# Patient Record
Sex: Male | Born: 1985 | Race: Black or African American | Hispanic: No | Marital: Single | Smoking: Current every day smoker
Health system: Southern US, Community
[De-identification: ages and names within clinical notes are randomized; demographics above are authoritative.]

## PROBLEM LIST (undated history)

## (undated) DIAGNOSIS — F1210 Cannabis abuse, uncomplicated: Secondary | ICD-10-CM

## (undated) DIAGNOSIS — N184 Chronic kidney disease, stage 4 (severe): Secondary | ICD-10-CM

## (undated) DIAGNOSIS — Z720 Tobacco use: Secondary | ICD-10-CM

## (undated) DIAGNOSIS — E109 Type 1 diabetes mellitus without complications: Secondary | ICD-10-CM

## (undated) DIAGNOSIS — Z91148 Patient's other noncompliance with medication regimen for other reason: Secondary | ICD-10-CM

## (undated) DIAGNOSIS — Z9114 Patient's other noncompliance with medication regimen: Secondary | ICD-10-CM

## (undated) DIAGNOSIS — E785 Hyperlipidemia, unspecified: Secondary | ICD-10-CM

## (undated) DIAGNOSIS — I469 Cardiac arrest, cause unspecified: Secondary | ICD-10-CM

## (undated) DIAGNOSIS — I10 Essential (primary) hypertension: Secondary | ICD-10-CM

## (undated) DIAGNOSIS — I639 Cerebral infarction, unspecified: Secondary | ICD-10-CM

## (undated) HISTORY — PX: EYE SURGERY: SHX253

## (undated) HISTORY — DX: Cerebral infarction, unspecified: I63.9

---
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
## 2005-02-21 ENCOUNTER — Inpatient Hospital Stay (HOSPITAL_COMMUNITY): Admission: EM | Admit: 2005-02-21 | Discharge: 2005-02-23 | Payer: Self-pay | Admitting: Emergency Medicine

## 2005-03-20 ENCOUNTER — Inpatient Hospital Stay (HOSPITAL_COMMUNITY): Admission: AD | Admit: 2005-03-20 | Discharge: 2005-03-23 | Payer: Self-pay | Admitting: Internal Medicine

## 2005-03-20 ENCOUNTER — Encounter: Payer: Self-pay | Admitting: *Deleted

## 2005-05-16 ENCOUNTER — Inpatient Hospital Stay (HOSPITAL_COMMUNITY): Admission: EM | Admit: 2005-05-16 | Discharge: 2005-05-19 | Payer: Self-pay | Admitting: *Deleted

## 2005-05-20 ENCOUNTER — Emergency Department (HOSPITAL_COMMUNITY): Admission: EM | Admit: 2005-05-20 | Discharge: 2005-05-20 | Payer: Self-pay | Admitting: Emergency Medicine

## 2005-08-24 ENCOUNTER — Inpatient Hospital Stay (HOSPITAL_COMMUNITY): Admission: EM | Admit: 2005-08-24 | Discharge: 2005-08-26 | Payer: Self-pay | Admitting: Emergency Medicine

## 2005-11-13 ENCOUNTER — Inpatient Hospital Stay (HOSPITAL_COMMUNITY): Admission: EM | Admit: 2005-11-13 | Discharge: 2005-11-16 | Payer: Self-pay | Admitting: Emergency Medicine

## 2006-08-01 ENCOUNTER — Emergency Department (HOSPITAL_COMMUNITY): Admission: EM | Admit: 2006-08-01 | Discharge: 2006-08-01 | Payer: Self-pay | Admitting: Emergency Medicine

## 2007-02-02 ENCOUNTER — Inpatient Hospital Stay (HOSPITAL_COMMUNITY): Admission: EM | Admit: 2007-02-02 | Discharge: 2007-02-04 | Payer: Self-pay | Admitting: Emergency Medicine

## 2008-04-10 ENCOUNTER — Emergency Department (HOSPITAL_COMMUNITY): Admission: EM | Admit: 2008-04-10 | Discharge: 2008-04-10 | Payer: Self-pay | Admitting: Emergency Medicine

## 2008-08-28 ENCOUNTER — Emergency Department (HOSPITAL_COMMUNITY): Admission: EM | Admit: 2008-08-28 | Discharge: 2008-08-28 | Payer: Self-pay | Admitting: Emergency Medicine

## 2008-09-26 ENCOUNTER — Emergency Department (HOSPITAL_COMMUNITY): Admission: EM | Admit: 2008-09-26 | Discharge: 2008-09-26 | Payer: Self-pay | Admitting: Emergency Medicine

## 2008-12-06 ENCOUNTER — Emergency Department (HOSPITAL_COMMUNITY): Admission: EM | Admit: 2008-12-06 | Discharge: 2008-12-06 | Payer: Self-pay | Admitting: Emergency Medicine

## 2009-03-23 ENCOUNTER — Emergency Department (HOSPITAL_COMMUNITY): Admission: EM | Admit: 2009-03-23 | Discharge: 2009-03-23 | Payer: Self-pay | Admitting: Emergency Medicine

## 2009-03-24 ENCOUNTER — Emergency Department (HOSPITAL_COMMUNITY): Admission: EM | Admit: 2009-03-24 | Discharge: 2009-03-24 | Payer: Self-pay | Admitting: Emergency Medicine

## 2009-05-13 ENCOUNTER — Emergency Department (HOSPITAL_COMMUNITY): Admission: EM | Admit: 2009-05-13 | Discharge: 2009-05-13 | Payer: Self-pay | Admitting: Emergency Medicine

## 2009-07-04 ENCOUNTER — Emergency Department (HOSPITAL_COMMUNITY): Admission: EM | Admit: 2009-07-04 | Discharge: 2009-07-04 | Payer: Self-pay | Admitting: Emergency Medicine

## 2009-07-17 ENCOUNTER — Emergency Department (HOSPITAL_COMMUNITY): Admission: EM | Admit: 2009-07-17 | Discharge: 2009-07-17 | Payer: Self-pay | Admitting: Emergency Medicine

## 2009-10-13 ENCOUNTER — Emergency Department (HOSPITAL_COMMUNITY): Admission: EM | Admit: 2009-10-13 | Discharge: 2009-10-13 | Payer: Self-pay | Admitting: Emergency Medicine

## 2009-10-14 ENCOUNTER — Inpatient Hospital Stay (HOSPITAL_COMMUNITY): Admission: EM | Admit: 2009-10-14 | Discharge: 2009-10-16 | Payer: Self-pay | Admitting: Emergency Medicine

## 2010-03-04 ENCOUNTER — Emergency Department (HOSPITAL_COMMUNITY): Admission: EM | Admit: 2010-03-04 | Discharge: 2010-03-04 | Payer: Self-pay | Admitting: Emergency Medicine

## 2010-04-27 ENCOUNTER — Inpatient Hospital Stay (HOSPITAL_COMMUNITY): Admission: EM | Admit: 2010-04-27 | Discharge: 2010-04-29 | Payer: Self-pay | Admitting: Emergency Medicine

## 2010-06-07 ENCOUNTER — Emergency Department (HOSPITAL_COMMUNITY): Admission: EM | Admit: 2010-06-07 | Discharge: 2010-06-07 | Payer: Self-pay | Admitting: Emergency Medicine

## 2010-12-03 LAB — URINALYSIS, ROUTINE W REFLEX MICROSCOPIC
Glucose, UA: >1000 mg/dL — AB
Leukocytes, UA: NEGATIVE
Specific Gravity, Urine: 1.015 (ref 1.005–1.030)
pH: 6 (ref 5.0–8.0)

## 2010-12-03 LAB — BLOOD GAS, ARTERIAL
Acid-base deficit: 2.2 mmol/L — ABNORMAL HIGH (ref 0.0–2.0)
Bicarbonate: 22.6 mEq/L (ref 20.0–24.0)
O2 Saturation: 96 %
TCO2: 20.2 mmol/L (ref 0–100)
pCO2 arterial: 42.1 mmHg (ref 35.0–45.0)

## 2010-12-03 LAB — BASIC METABOLIC PANEL
BUN: 13 mg/dL (ref 6–23)
CO2: 23 mEq/L (ref 19–32)
Chloride: 94 mEq/L — ABNORMAL LOW (ref 96–112)
GFR calc non Af Amer: >60 mL/min (ref >60)
Glucose, Bld: 367 mg/dL — ABNORMAL HIGH (ref 70–99)
Potassium: 3.7 mEq/L (ref 3.5–5.1)
Sodium: 136 mEq/L (ref 135–145)

## 2010-12-03 LAB — CBC
MCH: 30.9 pg (ref 26.0–34.0)
MCHC: 33.6 g/dL (ref 30.0–36.0)
WBC: 14.9 10*3/uL — ABNORMAL HIGH (ref 4.0–10.5)

## 2010-12-03 LAB — DIFFERENTIAL
Basophils Absolute: 0 10*3/uL (ref 0.0–0.1)
Basophils Relative: 0 % (ref 0–1)
Eosinophils Absolute: 0 10*3/uL (ref 0.0–0.7)
Eosinophils Relative: 0 % (ref 0–5)
Lymphocytes Relative: 7 % — ABNORMAL LOW (ref 12–46)
Lymphs Abs: 1 10*3/uL (ref 0.7–4.0)
Monocytes Absolute: 0.2 10*3/uL (ref 0.1–1.0)
Neutrophils Relative %: 91 % — ABNORMAL HIGH (ref 43–77)

## 2010-12-03 LAB — KETONES, QUALITATIVE: Acetone, Bld: NEGATIVE

## 2010-12-03 LAB — URINE MICROSCOPIC-ADD ON

## 2010-12-03 LAB — GLUCOSE, CAPILLARY

## 2010-12-04 LAB — DIFFERENTIAL
Basophils Absolute: 0 10*3/uL (ref 0.0–0.1)
Basophils Relative: 0 % (ref 0–1)
Basophils Relative: 0 % (ref 0–1)
Eosinophils Absolute: 0 10*3/uL (ref 0.0–0.7)
Eosinophils Relative: 0 % (ref 0–5)
Eosinophils Relative: 1 % (ref 0–5)
Lymphocytes Relative: 31 % (ref 12–46)
Lymphocytes Relative: 8 % — ABNORMAL LOW (ref 12–46)
Lymphs Abs: 1.1 10*3/uL (ref 0.7–4.0)
Monocytes Absolute: 0.2 10*3/uL (ref 0.1–1.0)
Monocytes Absolute: 0.3 10*3/uL (ref 0.1–1.0)
Monocytes Absolute: 0.4 10*3/uL (ref 0.1–1.0)
Monocytes Relative: 3 % (ref 3–12)
Monocytes Relative: 5 % (ref 3–12)
Neutro Abs: 11.7 10*3/uL — ABNORMAL HIGH (ref 1.7–7.7)
Neutro Abs: 5.4 10*3/uL (ref 1.7–7.7)

## 2010-12-04 LAB — COMPREHENSIVE METABOLIC PANEL
ALT: 13 U/L (ref 0–53)
AST: 15 U/L (ref 0–37)
AST: 19 U/L (ref 0–37)
Albumin: 3.3 g/dL — ABNORMAL LOW (ref 3.5–5.2)
Albumin: 3.4 g/dL — ABNORMAL LOW (ref 3.5–5.2)
Alkaline Phosphatase: 66 U/L (ref 39–117)
BUN: 4 mg/dL — ABNORMAL LOW (ref 6–23)
CO2: 25 mEq/L (ref 19–32)
Chloride: 103 mEq/L (ref 96–112)
Creatinine, Ser: 0.73 mg/dL (ref 0.4–1.5)
GFR calc Af Amer: >60 mL/min (ref >60)
GFR calc Af Amer: >60 mL/min (ref >60)
GFR calc non Af Amer: >60 mL/min (ref >60)
Potassium: 3.1 mEq/L — ABNORMAL LOW (ref 3.5–5.1)
Potassium: 3.5 mEq/L (ref 3.5–5.1)
Total Bilirubin: 1 mg/dL (ref 0.3–1.2)
Total Protein: 6.5 g/dL (ref 6.0–8.3)

## 2010-12-04 LAB — BLOOD GAS, ARTERIAL
Acid-base deficit: 1.4 mmol/L (ref 0.0–2.0)
Bicarbonate: 22.2 mEq/L (ref 20.0–24.0)
O2 Saturation: 97.5 %
Patient temperature: 37
TCO2: 19.4 mmol/L (ref 0–100)
pCO2 arterial: 33.9 mmHg — ABNORMAL LOW (ref 35.0–45.0)
pH, Arterial: 7.432 (ref 7.350–7.450)
pO2, Arterial: 98.2 mmHg (ref 80.0–100.0)

## 2010-12-04 LAB — GLUCOSE, CAPILLARY
Glucose-Capillary: 117 mg/dL — ABNORMAL HIGH (ref 70–99)
Glucose-Capillary: 119 mg/dL — ABNORMAL HIGH (ref 70–99)
Glucose-Capillary: 132 mg/dL — ABNORMAL HIGH (ref 70–99)
Glucose-Capillary: 162 mg/dL — ABNORMAL HIGH (ref 70–99)
Glucose-Capillary: 164 mg/dL — ABNORMAL HIGH (ref 70–99)
Glucose-Capillary: 177 mg/dL — ABNORMAL HIGH (ref 70–99)
Glucose-Capillary: 203 mg/dL — ABNORMAL HIGH (ref 70–99)
Glucose-Capillary: 234 mg/dL — ABNORMAL HIGH (ref 70–99)
Glucose-Capillary: 32 mg/dL — CL (ref 70–99)
Glucose-Capillary: 33 mg/dL — CL (ref 70–99)
Glucose-Capillary: 333 mg/dL — ABNORMAL HIGH (ref 70–99)
Glucose-Capillary: 34 mg/dL — CL (ref 70–99)
Glucose-Capillary: 36 mg/dL — CL (ref 70–99)
Glucose-Capillary: 44 mg/dL — CL (ref 70–99)
Glucose-Capillary: 487 mg/dL — ABNORMAL HIGH (ref 70–99)
Glucose-Capillary: 55 mg/dL — ABNORMAL LOW (ref 70–99)
Glucose-Capillary: 56 mg/dL — ABNORMAL LOW (ref 70–99)
Glucose-Capillary: 68 mg/dL — ABNORMAL LOW (ref 70–99)
Glucose-Capillary: 68 mg/dL — ABNORMAL LOW (ref 70–99)
Glucose-Capillary: 72 mg/dL (ref 70–99)
Glucose-Capillary: 83 mg/dL (ref 70–99)
Glucose-Capillary: 99 mg/dL (ref 70–99)
Glucose-Capillary: 99 mg/dL (ref 70–99)

## 2010-12-04 LAB — URINALYSIS, ROUTINE W REFLEX MICROSCOPIC
Bilirubin Urine: NEGATIVE
Glucose, UA: >1000 mg/dL — AB
Ketones, ur: >80 mg/dL — AB
Leukocytes, UA: NEGATIVE
Nitrite: NEGATIVE
Protein, ur: NEGATIVE mg/dL
Specific Gravity, Urine: 1.025 (ref 1.005–1.030)
Urobilinogen, UA: 0.2 mg/dL (ref 0.0–1.0)
pH: 6 (ref 5.0–8.0)

## 2010-12-04 LAB — KETONES, QUALITATIVE

## 2010-12-04 LAB — CBC
HCT: 45 % (ref 39.0–52.0)
Hemoglobin: 12.8 g/dL — ABNORMAL LOW (ref 13.0–17.0)
Hemoglobin: 15.2 g/dL (ref 13.0–17.0)
MCH: 31.1 pg (ref 26.0–34.0)
MCH: 31.2 pg (ref 26.0–34.0)
MCHC: 33.8 g/dL (ref 30.0–36.0)
MCV: 91.8 fL (ref 78.0–100.0)
Platelets: 179 10*3/uL (ref 150–400)
Platelets: 183 10*3/uL (ref 150–400)
RBC: 4.11 MIL/uL — ABNORMAL LOW (ref 4.22–5.81)
RDW: 14.4 % (ref 11.5–15.5)
WBC: 13.2 10*3/uL — ABNORMAL HIGH (ref 4.0–10.5)
WBC: 8.7 10*3/uL (ref 4.0–10.5)
WBC: 9.8 10*3/uL (ref 4.0–10.5)

## 2010-12-04 LAB — RAPID URINE DRUG SCREEN, HOSP PERFORMED
Amphetamines: NOT DETECTED
Barbiturates: NOT DETECTED
Benzodiazepines: NOT DETECTED
Cocaine: NOT DETECTED
Opiates: NOT DETECTED
Tetrahydrocannabinol: POSITIVE — AB

## 2010-12-04 LAB — BASIC METABOLIC PANEL
Chloride: 97 mEq/L (ref 96–112)
GFR calc Af Amer: >60 mL/min (ref >60)
Potassium: 3.8 mEq/L (ref 3.5–5.1)
Sodium: 139 mEq/L (ref 135–145)

## 2010-12-04 LAB — HEMOGLOBIN A1C: Hgb A1c MFr Bld: 12.8 % — ABNORMAL HIGH (ref <5.7)

## 2010-12-04 LAB — POTASSIUM: Potassium: 4.1 mEq/L (ref 3.5–5.1)

## 2010-12-04 LAB — URINE MICROSCOPIC-ADD ON

## 2010-12-04 LAB — BRAIN NATRIURETIC PEPTIDE: Pro B Natriuretic peptide (BNP): 50.8 pg/mL (ref 0.0–100.0)

## 2010-12-06 LAB — BLOOD GAS, ARTERIAL
Bicarbonate: 22.9 mEq/L (ref 20.0–24.0)
FIO2: 0.21 %
O2 Saturation: 96.7 %
pH, Arterial: 7.388 (ref 7.350–7.450)
pO2, Arterial: 91.5 mmHg (ref 80.0–100.0)

## 2010-12-06 LAB — GLUCOSE, CAPILLARY
Glucose-Capillary: 139 mg/dL — ABNORMAL HIGH (ref 70–99)
Glucose-Capillary: 173 mg/dL — ABNORMAL HIGH (ref 70–99)
Glucose-Capillary: 183 mg/dL — ABNORMAL HIGH (ref 70–99)
Glucose-Capillary: 226 mg/dL — ABNORMAL HIGH (ref 70–99)
Glucose-Capillary: 46 mg/dL — ABNORMAL LOW (ref 70–99)
Glucose-Capillary: 98 mg/dL (ref 70–99)

## 2010-12-06 LAB — CBC
HCT: 40.3 % (ref 39.0–52.0)
HCT: 45.9 % (ref 39.0–52.0)
Hemoglobin: 13.6 g/dL (ref 13.0–17.0)
Hemoglobin: 15.8 g/dL (ref 13.0–17.0)
Hemoglobin: 15.4 g/dL (ref 13.0–17.0)
MCHC: 34 g/dL (ref 30.0–36.0)
MCHC: 33.5 g/dL (ref 30.0–36.0)
MCV: 90.4 fL (ref 78.0–100.0)
Platelets: 163 10*3/uL (ref 150–400)
Platelets: 178 10*3/uL (ref 150–400)
Platelets: 202 10*3/uL (ref 150–400)
RBC: 4.41 MIL/uL (ref 4.22–5.81)
RDW: 13.9 % (ref 11.5–15.5)
RDW: 14.1 % (ref 11.5–15.5)
WBC: 7.9 10*3/uL (ref 4.0–10.5)
WBC: 12.2 10*3/uL — ABNORMAL HIGH (ref 4.0–10.5)

## 2010-12-06 LAB — BASIC METABOLIC PANEL
BUN: 10 mg/dL (ref 6–23)
BUN: 8 mg/dL (ref 6–23)
CO2: 22 mEq/L (ref 19–32)
CO2: 27 mEq/L (ref 19–32)
Calcium: 8.2 mg/dL — ABNORMAL LOW (ref 8.4–10.5)
Calcium: 8.7 mg/dL (ref 8.4–10.5)
Calcium: 8.8 mg/dL (ref 8.4–10.5)
Chloride: 101 mEq/L (ref 96–112)
Chloride: 98 mEq/L (ref 96–112)
Creatinine, Ser: 0.84 mg/dL (ref 0.4–1.5)
Creatinine, Ser: 0.87 mg/dL (ref 0.4–1.5)
Creatinine, Ser: 0.91 mg/dL (ref 0.4–1.5)
GFR calc Af Amer: >60 mL/min (ref >60)
GFR calc Af Amer: >60 mL/min (ref >60)
GFR calc Af Amer: >60 mL/min (ref >60)
GFR calc non Af Amer: >60 mL/min (ref >60)
GFR calc non Af Amer: >60 mL/min (ref >60)
Glucose, Bld: 273 mg/dL — ABNORMAL HIGH (ref 70–99)
Glucose, Bld: 377 mg/dL — ABNORMAL HIGH (ref 70–99)
Potassium: 4 mEq/L (ref 3.5–5.1)
Sodium: 136 mEq/L (ref 135–145)

## 2010-12-06 LAB — URINE MICROSCOPIC-ADD ON

## 2010-12-06 LAB — COMPREHENSIVE METABOLIC PANEL
ALT: 19 U/L (ref 0–53)
ALT: 15 U/L (ref 0–53)
Albumin: 4.1 g/dL (ref 3.5–5.2)
Albumin: 3.8 g/dL (ref 3.5–5.2)
Alkaline Phosphatase: 86 U/L (ref 39–117)
Alkaline Phosphatase: 74 U/L (ref 39–117)
BUN: 20 mg/dL (ref 6–23)
BUN: 12 mg/dL (ref 6–23)
Chloride: 101 mEq/L (ref 96–112)
GFR calc Af Amer: >60 mL/min (ref >60)
Glucose, Bld: 270 mg/dL — ABNORMAL HIGH (ref 70–99)
Potassium: 3.7 mEq/L (ref 3.5–5.1)
Potassium: 3.3 mEq/L — ABNORMAL LOW (ref 3.5–5.1)
Sodium: 134 mEq/L — ABNORMAL LOW (ref 135–145)
Total Bilirubin: 1.5 mg/dL — ABNORMAL HIGH (ref 0.3–1.2)
Total Protein: 8.1 g/dL (ref 6.0–8.3)

## 2010-12-06 LAB — URINALYSIS, ROUTINE W REFLEX MICROSCOPIC
Bilirubin Urine: NEGATIVE
Glucose, UA: >1000 mg/dL — AB
Hgb urine dipstick: NEGATIVE
Leukocytes, UA: NEGATIVE
Leukocytes, UA: NEGATIVE
Protein, ur: 30 mg/dL — AB
Protein, ur: NEGATIVE mg/dL
Specific Gravity, Urine: 1.015 (ref 1.005–1.030)
Urobilinogen, UA: 0.2 mg/dL (ref 0.0–1.0)
Urobilinogen, UA: 0.2 mg/dL (ref 0.0–1.0)
pH: 8.5 — ABNORMAL HIGH (ref 5.0–8.0)

## 2010-12-06 LAB — DIFFERENTIAL
Basophils Absolute: 0 10*3/uL (ref 0.0–0.1)
Basophils Absolute: 0 10*3/uL (ref 0.0–0.1)
Basophils Relative: 0 % (ref 0–1)
Basophils Relative: 0 % (ref 0–1)
Basophils Relative: 1 % (ref 0–1)
Basophils Relative: 0 % (ref 0–1)
Eosinophils Absolute: 0 10*3/uL (ref 0.0–0.7)
Eosinophils Absolute: 0 10*3/uL (ref 0.0–0.7)
Eosinophils Relative: 1 % (ref 0–5)
Lymphocytes Relative: 45 % (ref 12–46)
Lymphs Abs: 3.5 10*3/uL (ref 0.7–4.0)
Monocytes Absolute: 0.4 10*3/uL (ref 0.1–1.0)
Monocytes Absolute: 0.4 10*3/uL (ref 0.1–1.0)
Monocytes Absolute: 0.3 10*3/uL (ref 0.1–1.0)
Monocytes Relative: 6 % (ref 3–12)
Neutro Abs: 6.3 10*3/uL (ref 1.7–7.7)
Neutro Abs: 7.6 10*3/uL (ref 1.7–7.7)
Neutro Abs: 10.8 10*3/uL — ABNORMAL HIGH (ref 1.7–7.7)
Neutrophils Relative %: 81 % — ABNORMAL HIGH (ref 43–77)
Neutrophils Relative %: 89 % — ABNORMAL HIGH (ref 43–77)

## 2010-12-06 LAB — CULTURE, BLOOD (ROUTINE X 2): Report Status: 1302011

## 2010-12-06 LAB — MAGNESIUM: Magnesium: 1.9 mg/dL (ref 1.5–2.5)

## 2010-12-06 LAB — KETONES, QUALITATIVE

## 2010-12-10 ENCOUNTER — Emergency Department (HOSPITAL_COMMUNITY)
Admission: EM | Admit: 2010-12-10 | Discharge: 2010-12-11 | Disposition: A | Discharge status: Home / Self Care | Payer: Medicare Other | Attending: Emergency Medicine | Admitting: Emergency Medicine

## 2010-12-10 ENCOUNTER — Emergency Department (HOSPITAL_COMMUNITY): Payer: Medicare Other

## 2010-12-10 ENCOUNTER — Encounter (HOSPITAL_COMMUNITY): Payer: Self-pay | Admitting: Radiology

## 2010-12-10 DIAGNOSIS — R160 Hepatomegaly, not elsewhere classified: Secondary | ICD-10-CM | POA: Insufficient documentation

## 2010-12-10 DIAGNOSIS — E119 Type 2 diabetes mellitus without complications: Secondary | ICD-10-CM | POA: Insufficient documentation

## 2010-12-10 DIAGNOSIS — R109 Unspecified abdominal pain: Secondary | ICD-10-CM | POA: Insufficient documentation

## 2010-12-10 DIAGNOSIS — R1012 Left upper quadrant pain: Secondary | ICD-10-CM | POA: Insufficient documentation

## 2010-12-10 LAB — RAPID URINE DRUG SCREEN, HOSP PERFORMED
Amphetamines: NOT DETECTED
Barbiturates: NOT DETECTED
Benzodiazepines: NOT DETECTED
Opiates: POSITIVE — AB

## 2010-12-10 LAB — CBC
HCT: 43 % (ref 39.0–52.0)
MCHC: 35.1 g/dL (ref 30.0–36.0)
Platelets: 197 10*3/uL (ref 150–400)
RDW: 12.9 % (ref 11.5–15.5)
WBC: 14.8 10*3/uL — ABNORMAL HIGH (ref 4.0–10.5)

## 2010-12-10 LAB — HEPATIC FUNCTION PANEL
ALT: 17 U/L (ref 0–53)
AST: 22 U/L (ref 0–37)
Albumin: 3.7 g/dL (ref 3.5–5.2)
Total Protein: 7 g/dL (ref 6.0–8.3)

## 2010-12-10 LAB — DIFFERENTIAL
Basophils Absolute: 0 10*3/uL (ref 0.0–0.1)
Basophils Relative: 0 % (ref 0–1)
Eosinophils Relative: 0 % (ref 0–5)
Lymphocytes Relative: 8 % — ABNORMAL LOW (ref 12–46)

## 2010-12-10 LAB — BASIC METABOLIC PANEL
Calcium: 9.2 mg/dL (ref 8.4–10.5)
GFR calc Af Amer: >60 mL/min (ref >60)
GFR calc non Af Amer: >60 mL/min (ref >60)
Glucose, Bld: 343 mg/dL — ABNORMAL HIGH (ref 70–99)
Potassium: 3.9 mEq/L (ref 3.5–5.1)
Sodium: 137 mEq/L (ref 135–145)

## 2010-12-10 LAB — GLUCOSE, CAPILLARY: Glucose-Capillary: 252 mg/dL — ABNORMAL HIGH (ref 70–99)

## 2010-12-10 MED ORDER — IOHEXOL 300 MG/ML  SOLN
100.0000 mL | Freq: Once | INTRAMUSCULAR | Status: AC | PRN
  Administered 2010-12-10: 100 mL via INTRAVENOUS

## 2010-12-11 ENCOUNTER — Inpatient Hospital Stay (HOSPITAL_COMMUNITY)
Admission: EM | Admit: 2010-12-11 | Discharge: 2010-12-13 | DRG: 639 | Disposition: A | Discharge status: Home / Self Care | Payer: Medicare Other | Attending: Internal Medicine | Admitting: Internal Medicine

## 2010-12-11 DIAGNOSIS — IMO0002 Reserved for concepts with insufficient information to code with codable children: Principal | ICD-10-CM | POA: Diagnosis present

## 2010-12-11 DIAGNOSIS — E1065 Type 1 diabetes mellitus with hyperglycemia: Principal | ICD-10-CM | POA: Diagnosis present

## 2010-12-11 DIAGNOSIS — Z91199 Patient's noncompliance with other medical treatment and regimen due to unspecified reason: Secondary | ICD-10-CM

## 2010-12-11 DIAGNOSIS — Z794 Long term (current) use of insulin: Secondary | ICD-10-CM

## 2010-12-11 DIAGNOSIS — E860 Dehydration: Secondary | ICD-10-CM | POA: Diagnosis present

## 2010-12-11 DIAGNOSIS — Z9119 Patient's noncompliance with other medical treatment and regimen: Secondary | ICD-10-CM

## 2010-12-11 LAB — GLUCOSE, CAPILLARY
Glucose-Capillary: >600 mg/dL (ref 70–99)
Glucose-Capillary: 273 mg/dL — ABNORMAL HIGH (ref 70–99)

## 2010-12-11 LAB — URINALYSIS, ROUTINE W REFLEX MICROSCOPIC
Bilirubin Urine: NEGATIVE
Glucose, UA: >1000 mg/dL — AB
Leukocytes, UA: NEGATIVE
Nitrite: NEGATIVE
Protein, ur: NEGATIVE mg/dL
Protein, ur: NEGATIVE mg/dL
Specific Gravity, Urine: <1.005 — ABNORMAL LOW (ref 1.005–1.030)
Urobilinogen, UA: 0.2 mg/dL (ref 0.0–1.0)
Urobilinogen, UA: 0.2 mg/dL (ref 0.0–1.0)

## 2010-12-11 LAB — URINE MICROSCOPIC-ADD ON

## 2010-12-11 LAB — CBC
Hemoglobin: 14.8 g/dL (ref 13.0–17.0)
Platelets: 255 10*3/uL (ref 150–400)
RBC: 4.75 MIL/uL (ref 4.22–5.81)
WBC: 15.3 10*3/uL — ABNORMAL HIGH (ref 4.0–10.5)

## 2010-12-11 LAB — BASIC METABOLIC PANEL
CO2: 24 mEq/L (ref 19–32)
Chloride: 92 mEq/L — ABNORMAL LOW (ref 96–112)
GFR calc Af Amer: >60 mL/min (ref >60)
Potassium: 3.5 mEq/L (ref 3.5–5.1)

## 2010-12-11 LAB — DIFFERENTIAL
Basophils Relative: 1 % (ref 0–1)
Eosinophils Absolute: 0 10*3/uL (ref 0.0–0.7)
Lymphs Abs: 1.7 10*3/uL (ref 0.7–4.0)
Monocytes Relative: 5 % (ref 3–12)
Neutro Abs: 12.8 10*3/uL — ABNORMAL HIGH (ref 1.7–7.7)
Neutrophils Relative %: 83 % — ABNORMAL HIGH (ref 43–77)

## 2010-12-12 LAB — COMPREHENSIVE METABOLIC PANEL
ALT: 13 U/L (ref 0–53)
AST: 16 U/L (ref 0–37)
Albumin: 2.9 g/dL — ABNORMAL LOW (ref 3.5–5.2)
Alkaline Phosphatase: 71 U/L (ref 39–117)
Calcium: 7.9 mg/dL — ABNORMAL LOW (ref 8.4–10.5)
GFR calc Af Amer: >60 mL/min (ref >60)
Glucose, Bld: 117 mg/dL — ABNORMAL HIGH (ref 70–99)
Potassium: 3.6 mEq/L (ref 3.5–5.1)
Sodium: 137 mEq/L (ref 135–145)
Total Protein: 5.7 g/dL — ABNORMAL LOW (ref 6.0–8.3)

## 2010-12-12 LAB — CBC
MCHC: 34 g/dL (ref 30.0–36.0)
Platelets: 214 10*3/uL (ref 150–400)
RDW: 13.2 % (ref 11.5–15.5)
WBC: 12.5 10*3/uL — ABNORMAL HIGH (ref 4.0–10.5)

## 2010-12-12 LAB — DIFFERENTIAL
Basophils Absolute: 0.1 10*3/uL (ref 0.0–0.1)
Basophils Relative: 1 % (ref 0–1)
Eosinophils Absolute: 0.1 10*3/uL (ref 0.0–0.7)
Eosinophils Relative: 1 % (ref 0–5)
Lymphocytes Relative: 25 % (ref 12–46)
Monocytes Absolute: 0.8 10*3/uL (ref 0.1–1.0)

## 2010-12-12 LAB — GLUCOSE, CAPILLARY
Glucose-Capillary: 102 mg/dL — ABNORMAL HIGH (ref 70–99)
Glucose-Capillary: 77 mg/dL (ref 70–99)
Glucose-Capillary: 105 mg/dL — ABNORMAL HIGH (ref 70–99)
Glucose-Capillary: 113 mg/dL — ABNORMAL HIGH (ref 70–99)
Glucose-Capillary: 131 mg/dL — ABNORMAL HIGH (ref 70–99)
Glucose-Capillary: 51 mg/dL — ABNORMAL LOW (ref 70–99)
Glucose-Capillary: 111 mg/dL — ABNORMAL HIGH (ref 70–99)
Glucose-Capillary: 68 mg/dL — ABNORMAL LOW (ref 70–99)

## 2010-12-12 LAB — HEMOGLOBIN A1C: Mean Plasma Glucose: 312 mg/dL — ABNORMAL HIGH (ref <117)

## 2010-12-12 NOTE — H&P (Signed)
NAME:  KERT, MAKOSKY           ACCOUNT NO.:  [id number]  MEDICAL RECORD NO.:  [id number]           PATIENT TYPE:  I  LOCATION:  A313                          FACILITY:  [facility]  PHYSICIAN:  Doree Albee, M.D.DATE OF BIRTH:  July 31, 1986  DATE OF ADMISSION:  12/11/2010 DATE OF DISCHARGE:  LH                             HISTORY & PHYSICAL   CHIEF COMPLAINT: 1. Nausea and vomiting. 2. Hyperglycemia.  HISTORY OF PRESENT ILLNESS:  This is a 25 year old man, who is known to be type 1 diabetic and who has had several admissions in the past with mild diabetic ketoacidosis, presents once again with a 2-day history of nausea and vomiting.  He has noticed elevated blood glucoses.  He apparently was in the emergency room yesterday and was deemed to be okay for discharge back home.  Unfortunately, he has not improved.  He is known to be rather noncompliant with his diabetes and has not kept appointments with his endocrinologist, Dr. Dwyane Dee in [place].  In fact I cannot remember when I last saw him where that I should have been seeing him at least every 3 months.  He has some minimal abdominal pain. He was noted to have a blood glucose of 580, although, his bicarbonate was in the normal range at 24.  PAST MEDICAL HISTORY:  Significant for cataract surgery and type 1 diabetes.  MEDICATIONS:  He reportedly takes Lantus 100 units twice a day, but in past records that I have seen he has been hypoglycemic on this dosage. He takes also NovoLog short-acting insulin on a sliding scale.  ALLERGIES:  I do not see any evidence of any clear allergies here in the E-chart.  SOCIAL HISTORY:  The patient lives with his mother.  He is on disability.  He said he smokes 5 cigarettes per day.  Also, noted his history of cannabis use.  FAMILY HISTORY:  Noncontributory.  REVIEW OF SYSTEMS:  Apart from symptoms mentioned above, there are no other symptoms referable to all systems  reviewed.  PHYSICAL EXAMINATION:  VITAL SIGNS:  Temperature 97.5, blood pressure 167/79, pulse 100 in sinus rhythm, respiratory rate 12-14, saturation 100%. GENERAL:  He does not look septic and he really does not have increased respiration indicative typically of diabetic ketoacidosis.  He does look somewhat dehydrated clinically.  There is no jaundice, clubbing, or clinical anemia. CARDIOVASCULAR:  Heart sounds are present and normal. LUNGS:  Lung fields are clear. ABDOMEN:  Soft and really not tender.  Bowel sounds are somewhat scanty. NEUROLOGIC:  He is alert and oriented without any focal neurologic signs. SKIN:  There are no obvious skin lesions or rashes.  INVESTIGATIONS:  Hemoglobin 14.8, white blood cell count 15.3, platelets 255.  Sodium 134, potassium 3.5, chloride 92, bicarbonate 24, blood glucose 580, BUN 11, creatinine 1.22.  His calculated anion gap is actually 18.  PROBLEM LIST:  Uncontrolled diabetes, likely mild diabetic ketoacidosis.  PLAN: 1. Admit. 2. Intravenous fluids and for now he can have subcutaneous insulin.  I     think it is safe to do so and I think his symptoms would resolve     fairly  rapidly based on past experience in this situation with him. 3. Further recommendations will depend on the patient's hospital     progress.     Doree Albee, M.D.     NCG/MEDQ  D:  12/11/2010  T:  12/11/2010  Job:  [id number]  cc:   Elayne Snare, M.D. Fax: [phone]  Tammi Sou, MD Fax: [phone]  Electronically Signed by Hurshel Party M.D. on 12/12/2010 05:37:17 PM

## 2010-12-13 LAB — DIFFERENTIAL
Eosinophils Relative: 0 % (ref 0–5)
Lymphocytes Relative: 19 % (ref 12–46)
Lymphs Abs: 2 10*3/uL (ref 0.7–4.0)
Monocytes Relative: 6 % (ref 3–12)

## 2010-12-13 LAB — COMPREHENSIVE METABOLIC PANEL
Alkaline Phosphatase: 74 U/L (ref 39–117)
BUN: 2 mg/dL — ABNORMAL LOW (ref 6–23)
Chloride: 97 mEq/L (ref 96–112)
Glucose, Bld: 149 mg/dL — ABNORMAL HIGH (ref 70–99)
Potassium: 3 mEq/L — ABNORMAL LOW (ref 3.5–5.1)
Total Bilirubin: 1.1 mg/dL (ref 0.3–1.2)

## 2010-12-13 LAB — CBC
HCT: 43.3 % (ref 39.0–52.0)
MCH: 31.1 pg (ref 26.0–34.0)
MCV: 88.5 fL (ref 78.0–100.0)
RBC: 4.89 MIL/uL (ref 4.22–5.81)
RDW: 12.7 % (ref 11.5–15.5)
WBC: 10.5 10*3/uL (ref 4.0–10.5)

## 2010-12-13 LAB — GLUCOSE, CAPILLARY
Glucose-Capillary: 102 mg/dL — ABNORMAL HIGH (ref 70–99)
Glucose-Capillary: 78 mg/dL (ref 70–99)

## 2010-12-14 LAB — GLUCOSE, CAPILLARY: Glucose-Capillary: 186 mg/dL — ABNORMAL HIGH (ref 70–99)

## 2010-12-21 NOTE — Discharge Summary (Signed)
  Nathaniel [name], Nathaniel [name]           ACCOUNT NO.:  [id number]  MEDICAL RECORD NO.:  [id number]           PATIENT TYPE:  I  LOCATION:  A313                          FACILITY:  [facility]  PHYSICIAN:  Doree Albee, M.D.DATE OF BIRTH:  12/21/1985  DATE OF ADMISSION:  12/11/2010 DATE OF DISCHARGE:  03/25/2012LH                              DISCHARGE SUMMARY   FINAL DISCHARGE DIAGNOSIS:  Uncontrolled type 1 diabetes mellitus, improved.  CONDITION ON DISCHARGE:  Stable.  MEDICATIONS ON DISCHARGE: 1. Lantus insulin 40 units daily with and NovoLog insulin sliding     scale. 2. Metoclopramide 5 mg q.a.c.  HISTORY:  This 25 year old noncompliant type 1 diabetic was admitted 2 days ago with nausea, vomiting and significant hyperglycemia with blood sugar of 580.  Please see initial history and physical examination done by Dr. Hurshel Party.  HOSPITAL PROGRESS:  The patient's initial anion gap was 18, although his bicarbonate was in the normal range.  He was aggressively hydrated with intravenous fluids by the way of normal saline and subcutaneous insulin was used in a sliding scale manner.  He is improved over the last 24-36 hours, and he feels less nauseous and he feels better enough that he can able to eat now.  Metoclopramide prior to meals did help him achieve some stability.  Interestingly, he is reported home dose of Lantus insulin at 100 units twice a day.  In fact when this dosage was attempted to be given hypoglycemia was achieved, so I suspect based on his weight of 75 kg or so.  He really needs more in the region of 40-50 units of insulin daily.  Of course, this will need to be adjusted based on his sugar levels.  His hemoglobin A1c was noted to be 12.5%.  Today, he feels well.  PHYSICAL EXAMINATION:  VITAL SIGNS:  Temperature 98.4, blood pressure 146/96, pulse 80, saturation 99% on room air. HEART:  Sounds are present and normal. LUNG:  Fields are clear. NEUROLOGIC:  He is  alert and oriented without any focal neurologic signs.  LABORATORY DATA:  Investigations today show sodium of 134, potassium 3.0, bicarbonate 24, glucose 149, BUN 2, creatinine 0.74.  Hemoglobin 15.2, white blood cell count 10.5 which is a decrease from his initial admission white count of 14.8, platelets 208.  There is no real source of sepsis to explain raised white count but I suspect this was likely due to a significant dehydration.  DISPOSITION:  The patient is stable to be discharged home.  I have given him prescriptions for his metoclopramide and he already has Lantus insulin at home.  I have made it very clear and stressed to him the importance of followup of this lifelong condition, and he must keep up appointments with his endocrinologist, Dr. Dwyane Dee.  We will replete his potassium before his discharge.     Doree Albee, M.D.     NCG/MEDQ  D:  12/13/2010  T:  12/13/2010  Job:  [id number]  cc:   Nathaniel [name], M.D. [phone]  Electronically Signed by Hurshel Party M.D. on 12/21/2010 01:31:38 PM

## 2010-12-24 LAB — COMPREHENSIVE METABOLIC PANEL
AST: 21 U/L (ref 0–37)
Alkaline Phosphatase: 81 U/L (ref 39–117)
BUN: 16 mg/dL (ref 6–23)
CO2: 23 mEq/L (ref 19–32)
Chloride: 97 mEq/L (ref 96–112)
Creatinine, Ser: 1.08 mg/dL (ref 0.4–1.5)
GFR calc non Af Amer: >60 mL/min (ref >60)
Potassium: 3.8 mEq/L (ref 3.5–5.1)
Total Bilirubin: 2 mg/dL — ABNORMAL HIGH (ref 0.3–1.2)

## 2010-12-24 LAB — DIFFERENTIAL
Basophils Absolute: 0 10*3/uL (ref 0.0–0.1)
Basophils Absolute: 0 10*3/uL (ref 0.0–0.1)
Basophils Relative: 0 % (ref 0–1)
Basophils Relative: 0 % (ref 0–1)
Eosinophils Relative: 1 % (ref 0–5)
Lymphocytes Relative: 12 % (ref 12–46)
Monocytes Absolute: 0.4 10*3/uL (ref 0.1–1.0)
Monocytes Absolute: 0.1 10*3/uL (ref 0.1–1.0)
Neutro Abs: 10.5 10*3/uL — ABNORMAL HIGH (ref 1.7–7.7)
Neutro Abs: 10.9 10*3/uL — ABNORMAL HIGH (ref 1.7–7.7)
Neutrophils Relative %: 83 % — ABNORMAL HIGH (ref 43–77)

## 2010-12-24 LAB — BASIC METABOLIC PANEL
BUN: 9 mg/dL (ref 6–23)
CO2: 28 mEq/L (ref 19–32)
Calcium: 10.3 mg/dL (ref 8.4–10.5)
Creatinine, Ser: 0.74 mg/dL (ref 0.4–1.5)
Glucose, Bld: 156 mg/dL — ABNORMAL HIGH (ref 70–99)

## 2010-12-24 LAB — LIPASE, BLOOD: Lipase: 19 U/L (ref 11–59)

## 2010-12-24 LAB — URINALYSIS, ROUTINE W REFLEX MICROSCOPIC
Bilirubin Urine: NEGATIVE
Nitrite: NEGATIVE
Specific Gravity, Urine: 1.015 (ref 1.005–1.030)
Urobilinogen, UA: 0.2 mg/dL (ref 0.0–1.0)

## 2010-12-24 LAB — CBC
HCT: 48.8 % (ref 39.0–52.0)
Hemoglobin: 16 g/dL (ref 13.0–17.0)
MCHC: 33.6 g/dL (ref 30.0–36.0)
MCV: 92.7 fL (ref 78.0–100.0)
RBC: 5.26 MIL/uL (ref 4.22–5.81)
RDW: 14.5 % (ref 11.5–15.5)
WBC: 12.6 10*3/uL — ABNORMAL HIGH (ref 4.0–10.5)

## 2010-12-24 LAB — KETONES, QUALITATIVE

## 2010-12-26 LAB — URINALYSIS, ROUTINE W REFLEX MICROSCOPIC
Bilirubin Urine: NEGATIVE
Glucose, UA: >1000 mg/dL — AB
Ketones, ur: >80 mg/dL — AB
Leukocytes, UA: NEGATIVE
Protein, ur: NEGATIVE mg/dL
pH: 7 (ref 5.0–8.0)

## 2010-12-26 LAB — DIFFERENTIAL
Eosinophils Absolute: 0.2 10*3/uL (ref 0.0–0.7)
Eosinophils Relative: 2 % (ref 0–5)
Lymphs Abs: 2.1 10*3/uL (ref 0.7–4.0)
Monocytes Relative: 2 % — ABNORMAL LOW (ref 3–12)
Neutrophils Relative %: 82 % — ABNORMAL HIGH (ref 43–77)

## 2010-12-26 LAB — COMPREHENSIVE METABOLIC PANEL
Albumin: 3.9 g/dL (ref 3.5–5.2)
Alkaline Phosphatase: 96 U/L (ref 39–117)
BUN: 17 mg/dL (ref 6–23)
CO2: 24 mEq/L (ref 19–32)
Chloride: 95 mEq/L — ABNORMAL LOW (ref 96–112)
Creatinine, Ser: 1.17 mg/dL (ref 0.4–1.5)
GFR calc non Af Amer: >60 mL/min (ref >60)
Glucose, Bld: 475 mg/dL — ABNORMAL HIGH (ref 70–99)
Potassium: 3.5 mEq/L (ref 3.5–5.1)
Total Bilirubin: 1.5 mg/dL — ABNORMAL HIGH (ref 0.3–1.2)

## 2010-12-26 LAB — CBC
HCT: 45.7 % (ref 39.0–52.0)
Hemoglobin: 15.7 g/dL (ref 13.0–17.0)
MCV: 91.2 fL (ref 78.0–100.0)
RBC: 5.01 MIL/uL (ref 4.22–5.81)
WBC: 15.2 10*3/uL — ABNORMAL HIGH (ref 4.0–10.5)

## 2010-12-26 LAB — URINE MICROSCOPIC-ADD ON

## 2010-12-27 LAB — URINALYSIS, ROUTINE W REFLEX MICROSCOPIC
Leukocytes, UA: NEGATIVE
Nitrite: NEGATIVE
Protein, ur: NEGATIVE mg/dL
Specific Gravity, Urine: 1.02 (ref 1.005–1.030)
Urobilinogen, UA: 0.2 mg/dL (ref 0.0–1.0)

## 2010-12-27 LAB — URINE MICROSCOPIC-ADD ON

## 2010-12-27 LAB — BASIC METABOLIC PANEL
CO2: 26 mEq/L (ref 19–32)
Chloride: 92 mEq/L — ABNORMAL LOW (ref 96–112)
GFR calc Af Amer: >60 mL/min (ref >60)
Sodium: 131 mEq/L — ABNORMAL LOW (ref 135–145)

## 2010-12-27 LAB — DIFFERENTIAL
Basophils Absolute: 0 10*3/uL (ref 0.0–0.1)
Basophils Relative: 0 % (ref 0–1)
Monocytes Relative: 2 % — ABNORMAL LOW (ref 3–12)
Neutro Abs: 11.6 10*3/uL — ABNORMAL HIGH (ref 1.7–7.7)
Neutrophils Relative %: 90 % — ABNORMAL HIGH (ref 43–77)

## 2010-12-27 LAB — COMPREHENSIVE METABOLIC PANEL
Alkaline Phosphatase: 85 U/L (ref 39–117)
BUN: 13 mg/dL (ref 6–23)
CO2: 27 mEq/L (ref 19–32)
GFR calc non Af Amer: >60 mL/min (ref >60)
Glucose, Bld: 394 mg/dL — ABNORMAL HIGH (ref 70–99)
Potassium: 4 mEq/L (ref 3.5–5.1)
Total Bilirubin: 1.7 mg/dL — ABNORMAL HIGH (ref 0.3–1.2)
Total Protein: 7.4 g/dL (ref 6.0–8.3)

## 2010-12-27 LAB — CBC
HCT: 47 % (ref 39.0–52.0)
Hemoglobin: 16.1 g/dL (ref 13.0–17.0)
RBC: 5.1 MIL/uL (ref 4.22–5.81)
RDW: 13.5 % (ref 11.5–15.5)

## 2010-12-27 LAB — GLUCOSE, CAPILLARY
Glucose-Capillary: 177 mg/dL — ABNORMAL HIGH (ref 70–99)
Glucose-Capillary: 366 mg/dL — ABNORMAL HIGH (ref 70–99)
Glucose-Capillary: 424 mg/dL — ABNORMAL HIGH (ref 70–99)

## 2010-12-31 LAB — URINALYSIS, ROUTINE W REFLEX MICROSCOPIC
Glucose, UA: 250 mg/dL — AB
Ketones, ur: >80 mg/dL — AB
Leukocytes, UA: NEGATIVE
Nitrite: NEGATIVE
Protein, ur: >300 mg/dL — AB

## 2010-12-31 LAB — DIFFERENTIAL
Basophils Absolute: 0.1 10*3/uL (ref 0.0–0.1)
Lymphocytes Relative: 8 % — ABNORMAL LOW (ref 12–46)
Lymphs Abs: 1.1 10*3/uL (ref 0.7–4.0)
Neutro Abs: 12.2 10*3/uL — ABNORMAL HIGH (ref 1.7–7.7)
Neutrophils Relative %: 89 % — ABNORMAL HIGH (ref 43–77)

## 2010-12-31 LAB — COMPREHENSIVE METABOLIC PANEL
AST: 21 U/L (ref 0–37)
BUN: 17 mg/dL (ref 6–23)
CO2: 26 mEq/L (ref 19–32)
Calcium: 9.4 mg/dL (ref 8.4–10.5)
Chloride: 91 mEq/L — ABNORMAL LOW (ref 96–112)
Creatinine, Ser: 0.96 mg/dL (ref 0.4–1.5)
GFR calc Af Amer: >60 mL/min (ref >60)
GFR calc non Af Amer: >60 mL/min (ref >60)
Total Bilirubin: 1.7 mg/dL — ABNORMAL HIGH (ref 0.3–1.2)

## 2010-12-31 LAB — BLOOD GAS, ARTERIAL
FIO2: 21 %
Patient temperature: 36.6
TCO2: 24.3 mmol/L (ref 0–100)
pCO2 arterial: 46.8 mmHg — ABNORMAL HIGH (ref 35.0–45.0)
pH, Arterial: 7.402 (ref 7.350–7.450)

## 2010-12-31 LAB — CBC
HCT: 49 % (ref 39.0–52.0)
MCHC: 33.7 g/dL (ref 30.0–36.0)
MCV: 92.1 fL (ref 78.0–100.0)
RBC: 5.32 MIL/uL (ref 4.22–5.81)
WBC: 13.8 10*3/uL — ABNORMAL HIGH (ref 4.0–10.5)

## 2010-12-31 LAB — GLUCOSE, CAPILLARY: Glucose-Capillary: 184 mg/dL — ABNORMAL HIGH (ref 70–99)

## 2010-12-31 LAB — LIPASE, BLOOD: Lipase: 11 U/L (ref 11–59)

## 2011-01-04 LAB — CBC
Platelets: 199 10*3/uL (ref 150–400)
RDW: 13.3 % (ref 11.5–15.5)
WBC: 13.3 10*3/uL — ABNORMAL HIGH (ref 4.0–10.5)

## 2011-01-04 LAB — BASIC METABOLIC PANEL
BUN: 16 mg/dL (ref 6–23)
Calcium: 9.6 mg/dL (ref 8.4–10.5)
Chloride: 95 mEq/L — ABNORMAL LOW (ref 96–112)
Creatinine, Ser: 0.97 mg/dL (ref 0.4–1.5)
Creatinine, Ser: 1.05 mg/dL (ref 0.4–1.5)
GFR calc Af Amer: >60 mL/min (ref >60)
GFR calc non Af Amer: >60 mL/min (ref >60)
GFR calc non Af Amer: >60 mL/min (ref >60)
Glucose, Bld: 331 mg/dL — ABNORMAL HIGH (ref 70–99)
Potassium: 4.5 mEq/L (ref 3.5–5.1)

## 2011-01-04 LAB — GLUCOSE, CAPILLARY
Glucose-Capillary: 218 mg/dL — ABNORMAL HIGH (ref 70–99)
Glucose-Capillary: 332 mg/dL — ABNORMAL HIGH (ref 70–99)

## 2011-01-04 LAB — DIFFERENTIAL
Basophils Absolute: 0.1 10*3/uL (ref 0.0–0.1)
Lymphocytes Relative: 10 % — ABNORMAL LOW (ref 12–46)
Neutro Abs: 11.8 10*3/uL — ABNORMAL HIGH (ref 1.7–7.7)

## 2011-01-04 LAB — URINALYSIS, ROUTINE W REFLEX MICROSCOPIC
Glucose, UA: 500 mg/dL — AB
Ketones, ur: >80 mg/dL — AB
Leukocytes, UA: NEGATIVE
Protein, ur: 30 mg/dL — AB
Urobilinogen, UA: 0.2 mg/dL (ref 0.0–1.0)

## 2011-01-04 LAB — HEPATIC FUNCTION PANEL
ALT: 19 U/L (ref 0–53)
Alkaline Phosphatase: 95 U/L (ref 39–117)
Bilirubin, Direct: 0.3 mg/dL (ref 0.0–0.3)
Indirect Bilirubin: 2 mg/dL — ABNORMAL HIGH (ref 0.3–0.9)

## 2011-02-02 NOTE — H&P (Signed)
NAME:  Nathaniel [name], Nathaniel [name]           ACCOUNT NO.:  [id number]   MEDICAL RECORD NO.:  [id number]          PATIENT TYPE:  INP   LOCATION:  IC07                          FACILITY:  [facility]   PHYSICIAN:  Bonnielee Haff, MD     DATE OF BIRTH:  23-Aug-1986   DATE OF ADMISSION:  02/02/2007  DATE OF DISCHARGE:  LH                              HISTORY & PHYSICAL   PRIMARY CARE PHYSICIAN:  Micah Flesher. Halm, DO, FAAP.   ENDOCRINOLOGIST:  Elayne Snare, M.D. at [facility].   ADMITTING DIAGNOSES:  1. Diabetic ketoacidosis.  2. Insulin-dependent diabetes.   CHIEF COMPLAINT:  Nausea, vomiting, and abdominal pain since yesterday.   HISTORY OF PRESENT ILLNESS:  The patient is a 25 year old African  American male who has had diabetes for the past 6 years, insulin  dependent, who was in his usual state of health until yesterday when he  started feeling nauseous, followed by emesis, but he was also having  some vague abdominal, neck, and shoulder pains.  The patient continued  to vomit throughout the night, and this morning he felt very worse and  decided to come into the ED.  Denied any fever or chills.  Denies any  sick contacts.  He admitted to having some cough, with no expectoration.  No dysuria.  No joint pains.  Really, no other symptoms.  No sick  contacts as well.  He said he has been compliant with his Lantus dosage.  He denied any recent drug use or alcohol intake.   MEDICATIONS AT HOME:  1. Lantus insulin 80 units b.i.d.  2. NovoLog on a sliding scale.   ALLERGIES:  No known drug allergies.   PAST MEDICAL HISTORY:  1. Insulin-dependent diabetes diagnosed at the age of 25.  2. He has had some corneal implants, eye implants, as the result of      diabetic complications in the past.  3. He has had numerous admissions in the past for diabetic      ketoacidosis.   SOCIAL HISTORY:  He lives in [place] with his mother.  He smokes  about 1/2 a pack of cigarettes per day.  Denies any recent  alcohol or  illicit drug use, though he admits to using them in the past.   FAMILY HISTORY:  Significant for type 1 diabetes in the father, who is  demised.  The patient's father also had renal failure, on dialysis.  There is a lot of history of diabetes in the family.   REVIEW OF SYSTEMS:  GENERAL:  Positive for weakness and malaise.  HEENT:  Unremarkable.  GI:  As in HPI.  GU:  Unremarkable.  RESPIRATORY:  As in  HPI.  CARDIOVASCULAR:  Unremarkable.  ENDOCRINE:  As in HPI.  MUSCULOSKELETAL:  Unremarkable.  RENAL:  Unremarkable.  NEUROLOGIC:  Unremarkable.   PHYSICAL EXAMINATION:  VITAL SIGNS:  He was found to be afebrile on  assessment in the ED.  Temperature 97, blood pressure 150/77, heart rate  in the 140s, irregular, respiratory rate was about 24, saturation 100%  on room air.  GENERAL:  This is a thin [place] American male  in no distress.  Alert,  and is able to communicate well at this point.  HEENT:  There is no pallor, no icterus.  Oral mucous membranes are very  dry.  No oral lesions are noted.  NECK:  Soft and supple.  No thyromegaly is appreciated.  LUNGS:  Clear to auscultation bilaterally.  No wheezes, rales, or  rhonchi present.  CARDIOVASCULAR:  S1, S2 normal, regular.  No murmurs appreciated.  No S3  or S4.  No rubs or heard or bruits appreciated.  ABDOMEN:  Soft, nontender, nondistended.  Bowel sounds are present.  No  mass or organomegaly is appreciated.  EXTREMITIES:  Without edema.  Peripheral pulses are palpable.  NEUROLOGIC:  The patient is alert and oriented x3.  No focal  neurological deficits are present.   LABORATORY DATA:  ABG shows a pH of 7.07, PCO2 is 13, PO2 is 139, oxygen  saturation 97%.  White count 28,000, with more than 20% bands,  hemoglobin is 16, platelet count 400.  Sodium 128, potassium 6.5,  chloride 87, bicarbonate less than 5, glucose 767, BUN is 30, creatinine  2.4.  LFTs, except for a bilirubin of 2.8, are normal.  UA shows   glucose, bilirubin, ketones, mild protein, but no evidence for  infection.  Chest x-ray did not show any evidence of pneumonia.  Telemetry showed sinus tachycardia.   IMPRESSION:  This is a 25 year old African American male with a history  of insulin-dependent diabetes, who presents with nausea and vomiting and  was found to have diabetic ketoacidosis along with acute renal failure,  possibly from dehydration.  He also has hyperkalemia.  The precipitating  factors for his DKA are not clearly identified.  The patient denies drug  use, alcohol use, and there is no evidence for any infection, though he  does have leukocytosis with bandemia.   PLAN:  1. Diabetic ketoacidosis.  He will be on the DKA protocol of      Glucommander.  His BMET will be followed closely.  His anion gap is      more than 30, which will also be monitored closely.  Patient will      be admitted to the ICU. HE does not require any bicarbonate drip at      this point.  Will recheck his BMET and decide on that aspect.  2. Leukocytosis with bandemia.  No evidence for any overt infection.      However, because of the bandemia, I will empirically put him on      Levaquin and then follow it from there.  3. Acute renal failure.  Hopefully, with hydration, he should improve.  4. Hyperbilirubinemia.  Etiology is unclear.  We will continue to      follow it.  If needed, further evaluation will be done.   HbA1c will be checked.  The urine drug screen will be checked.  Alcohol  level will be checked.   The patient required about 1 hour of critical care time.   Further management decisions will be based on results of initial testing  and the patient's response to treatment.      Bonnielee Haff, MD  Electronically Signed     GK/MEDQ  D:  02/02/2007  T:  02/02/2007  Job:  [id number]   cc:   Micah Flesher. Neva Seat  Fax: [phone]   Elayne Snare, M.D.  Fax: [phone]

## 2011-02-02 NOTE — Discharge Summary (Signed)
NAME:  Nathaniel [name], Nathaniel [name]           ACCOUNT NO.:  [id number]   MEDICAL RECORD NO.:  [id number]          PATIENT TYPE:  INP   LOCATION:  A225                          FACILITY:  [facility]   PHYSICIAN:  Bonnielee Haff, MD     DATE OF BIRTH:  1986-01-01   DATE OF ADMISSION:  02/02/2007  DATE OF DISCHARGE:  05/17/2008LH                               DISCHARGE SUMMARY   Please see the H&P dictated at the time of admission for details  regarding the patient's presenting illness.   DISCHARGE DIAGNOSES:  1. Diabetic ketoacidosis resolved.  2. Poorly controlled type 1 diabetes.  3. Marijuana abuse.   BRIEF HOSPITAL COURSE:  Briefly, this is a 25 year old, African-American  male who presented to the emergency department 2 days ago with complaint  of weakness, nausea, vomiting, abdominal pain. He was found to be in  diabetic ketoacidosis. His blood sugars were in the 700s. His ABG showed  a pH of 7.0. The patient was admitted to the intensive care unit,  started on insulin drip, given IV fluids and slowly his ketoacidosis  corrected. His acidosis has been corrected now for more than 24 hours.  He has been tolerating p.o. intake quite well with no difficulties. He  has been having some low blood sugars over the past day or so which has  required D50. He tells me that he takes 80 units of Lantus twice daily.  He was given 40 units yesterday morning, was put on 60 last night which  was not given because of the hypoglycemia. I think the problem here is  that the patient does not follow a strict diet at home which results in  poor glycemic control requiring higher insulin doses and here in the  hospital he is getting a strict modified carbohydrate diet requiring  lower insulin doses. Hence, I told the patient to monitor his blood  sugars before every meal and at bedtime. I am going to start him off  with a lower dose of Lantus 20 units b.i.d. and then I have given him  instructions how to increase the  dose depending on the blood sugars. I  have also asked him to see Dr. Dwyane Dee as soon as possible for further  adjustment of his insulin.   He has been counseled strongly about not to using marijuana.   His HB A1c was 13.7.   His leukocytosis was also corrected. No focus of infection was found. No  need was felt to continue antibiotics at this point.   His electrolyte abnormalites were also corrected.   DISCHARGE MEDICATIONS:  1. Lantus insulin 20 units b.i.d. and then further adjustment based on      CBGs.  2. NovoLog sliding scale as before.   Followup with Dr. Dwyane Dee as soon as possible, with Dr. Cleta Alberts in 2-3 weeks.   DIET:  Modified carbohydrate diet   ACTIVITY:  No restrictions.   No consultations were obtained during this admission.   Total Discharge Time: 45mins      Bonnielee Haff, MD  Electronically Signed     GK/MEDQ  D:  02/04/2007  T:  02/04/2007  Job:  [id number]   cc:   Micah Flesher. Neva Seat  Fax: [phone]   Elayne Snare, M.D.  Fax: [phone]

## 2011-02-02 NOTE — H&P (Signed)
NAME:  Nathaniel [name], Nathaniel [name]           ACCOUNT NO.:  [id number]   MEDICAL RECORD NO.:  [id number]          PATIENT TYPE:  INP   LOCATION:  IC07                          FACILITY:  [facility]   PHYSICIAN:  Bonnielee Haff, MD     DATE OF BIRTH:  [birth year]   DATE OF ADMISSION:  02/02/2007  DATE OF DISCHARGE:  LH                              HISTORY & PHYSICAL   PRIMARY CARE PHYSICIAN:  Micah Flesher. Halm, DO, FAAP.   ENDOCRINOLOGIST:  Elayne Snare, M.D. at [facility].   ADMITTING DIAGNOSES:  1. Diabetic ketoacidosis.  2. Insulin-dependent diabetes.   CHIEF COMPLAINT:  Nausea, vomiting, and abdominal pain since yesterday.   HISTORY OF PRESENT ILLNESS:  The patient is a 25 year old African  American male who has had diabetes for the past 6 years, insulin  dependent, who was in his usual state of health until yesterday when he  started feeling nauseous, followed by emesis, but he was also having  some vague abdominal, neck, and shoulder pains.  The patient continued  to vomit throughout the night, and this morning he felt very worse and  decided to come into the ED.  Denied any fever or chills.  Denies any  sick contacts.  He admitted to having some cough, with no expectoration.  No dysuria.  No joint pains.  Really, no other symptoms.  No sick  contacts as well.  He said he has been compliant with his Lantus dosage.  He denied any recent drug use or alcohol intake.   MEDICATIONS AT HOME:  1. Lantus insulin 80 units b.i.d.  2. NovoLog on a sliding scale.   ALLERGIES:  No known drug allergies.   PAST MEDICAL HISTORY:  1. Insulin-dependent diabetes diagnosed at the age of 4.  2. He has had some corneal implants, eye implants, as the result of      diabetic complications in the past.  3. He has had numerous admissions in the past for diabetic      ketoacidosis.   SOCIAL HISTORY:  He lives in [place] with his mother.  He smokes  about 1/2 a pack of cigarettes per day.  Denies any recent  alcohol or  illicit drug use, though he admits to using them in the past.   FAMILY HISTORY:  Significant for type 1 diabetes in the father, who is  demised.  The patient's father also had renal failure, on dialysis.  There is a lot of history of diabetes in the family.   REVIEW OF SYSTEMS:  GENERAL:  Positive for weakness and malaise.  HEENT:  Unremarkable.  GI:  As in HPI.  GU:  Unremarkable.  RESPIRATORY:  As in  HPI.  CARDIOVASCULAR:  Unremarkable.  ENDOCRINE:  As in HPI.  MUSCULOSKELETAL:  Unremarkable.  RENAL:  Unremarkable.  NEUROLOGIC:  Unremarkable.   PHYSICAL EXAMINATION:  VITAL SIGNS:  He was found to be afebrile on  assessment in the ED.  Temperature 97, blood pressure 150/77, heart rate  in the 140s, irregular, respiratory rate was about 24, saturation 100%  on room air.  GENERAL:  This is a thin [place] American male  in no distress.  Alert,  and is able to communicate well at this point.  HEENT:  There is no pallor, no icterus.  Oral mucous membranes are very  dry.  No oral lesions are noted.  NECK:  Soft and supple.  No thyromegaly is appreciated.  LUNGS:  Clear to auscultation bilaterally.  No wheezes, rales, or  rhonchi present.  CARDIOVASCULAR:  S1, S2 normal, regular.  No murmurs appreciated.  No S3  or S4.  No rubs or heard or bruits appreciated.  ABDOMEN:  Soft, nontender, nondistended.  Bowel sounds are present.  No  mass or organomegaly is appreciated.  EXTREMITIES:  Without edema.  Peripheral pulses are palpable.  NEUROLOGIC:  The patient is alert and oriented x3.  No focal  neurological deficits are present.   LABORATORY DATA:  ABG shows a pH of 7.07, PCO2 is 13, PO2 is 139, oxygen  saturation 97%.  White count 28,000, with more than 20% bands,  hemoglobin is 16, platelet count 400.  Sodium 128, potassium 6.5,  chloride 87, bicarbonate less than 5, glucose 767, BUN is 30, creatinine  2.4.  LFTs, except for a bilirubin of 2.8, are normal.  UA shows   glucose, bilirubin, ketones, mild protein, but no evidence for  infection.  Chest x-ray did not show any evidence of pneumonia.  Telemetry showed sinus tachycardia.   IMPRESSION:  This is a 25 year old African American male with a history  of insulin-dependent diabetes, who presents with nausea and vomiting and  was found to have diabetic ketoacidosis along with acute renal failure,  possibly from dehydration.  He also has hyperkalemia.  The precipitating  factors for his DKA are not clearly identified.  The patient denies drug  use, alcohol use, and there is no evidence for any infection, though he  does have leukocytosis with bandemia.   PLAN:  1. Diabetic ketoacidosis.  He will be on the DKA protocol of      Glucommander.  His BMET will be followed closely.  His anion gap is      more than 30, which will also be monitored closely.  HE does not      require any bicarbonate drip at this point.  Will recheck his BMET      and decide on that aspect.  2. Leukocytosis with bandemia.  No evidence for any overt infection.      However, because of the bandemia, I will empirically put him on      Levaquin and then follow it from there.  3. Acute renal failure.  Hopefully, with hydration, he should improve.  4. Hyperbilirubinemia.  Etiology is unclear.  We will continue to      follow it.  If needed, further evaluation will be done.   HbA1c will be checked.  The urine drug screen will be checked.  Alcohol  level will be checked.   The patient required about 1 hour of critical care time.   Further management decisions will be based on results of initial testing  and the patient's response to treatment.   Please note above is preliminary until signed.      Bonnielee Haff, MD     GK/MEDQ  D:  02/02/2007  T:  02/02/2007  Job:  [id number]   cc:   Micah Flesher. Neva Seat  Fax: [phone]   Elayne Snare, M.D.  Fax: [phone]

## 2011-02-05 NOTE — Discharge Summary (Signed)
Nathaniel [name], Nathaniel [name]           ACCOUNT NO.:  [id number]   MEDICAL RECORD NO.:  [id number]          PATIENT TYPE:  INP   LOCATION:  A204                          FACILITY:  [facility]   PHYSICIAN:  Bonnielee Haff, MD     DATE OF BIRTH:  [birth year]   DATE OF ADMISSION:  05/16/2005  DATE OF DISCHARGE:  08/30/2006LH                                 DISCHARGE SUMMARY   DISCHARGE DIAGNOSES:  1.  Severe diabetic ketoacidosis, clinically resolved.  2.  Poorly controlled type 1 diabetes.  3.  Noncompliance.   HISTORY AND PHYSICAL:  Please review H&P dictated at the time of her  admission for details regarding the patient's presenting illness.   HOSPITAL COURSE:  DIABETIC KETOACIDOSIS.  The patient is an 25 year old  African American male with history of type 1 diabetes who presented to the  ED with nausea, vomiting.  The patient was found to have severe diabetic  ketoacidosis at that time.  His pH on the arterial blood gas was 6.9.  The  patient was managed in the intensive care unit for DKA.  He was put on  insulin infusion and given IV fluids. With this management his symptoms have  improved and his anion gap has closed.  The patient has done well the past  two days.  He has been tolerating p.o. intake adequately.  We have switched  him over to Lantus insulin, and his sugars, though not optimal, are better  controlled.  CBG this morning is 160.  The patient has been extensively  counseled about the importance of adhering to his medication regimen.  He  was informed that his HB A1C, which is a marker for a long term control of  his diabetes, was 16.6, which is considered very, very poor.  He was also  told that the poorly controlled diabetes can result in heart conditions and  premature death.  The patient is also seen by a nutritionist, and he was  given information to attend some further diabetic lessons that are offered.  The patient seems to be interested in attending these lectures and I  have  once again encouraged him to do so.  On the morning of discharge, his vital  signs are all stable and he is considered okay to go home.   DISCHARGE MEDICATIONS:  1.  Lantus insulin 48 units subcutaneously q.h.s.  The patient told me that      he was taking 58 units at home.  I have asked him to check his finger      stick glucose as he has been doing before each meals, and given himself      NovoLog.  If it appears as blood sugars are creeping up, he needs to      slowly increase his Lantus to 58.  I have also informed him that he      needs to call his doctor's office, Dr. Ernestine Conrad, before taking any other      measure.  2.  NovoLog for sliding scale as the patient was doing before.  The patient      has been given  a prescription for both of these medications.   FOLLOWUP:  1.  Follow up with Dr. Ernestine Conrad early next week.  2.  Diabetic nutrition education lessons as per information provided to the      patient by nutritionist.   STUDIES:  Imaging studies done included chest x-ray which just showed  bibasilar atelectasis, otherwise, no other significant findings.  The  patient has been afebrile during this admission.      Bonnielee Haff, MD  Electronically Signed     GK/MEDQ  D:  05/19/2005  T:  05/19/2005  Job:  [id number]   cc:   Tammi Sou, MD  Fax: [phone]

## 2011-02-05 NOTE — H&P (Signed)
NAME:  Nathaniel [name], Nathaniel [name]                     ACCOUNT NO.:  [id number]   MEDICAL RECORD NO.:  [id number]                   PATIENT TYPE:  INP   LOCATION:  A327                                 FACILITY:  [facility]   PHYSICIAN:  Micah Flesher. Halm, D.O.                DATE OF BIRTH:  June 18, 1986   DATE OF ADMISSION:  10/21/2002  DATE OF DISCHARGE:                                HISTORY & PHYSICAL   CHIEF COMPLAINT:  Sugar is high, vomiting.   BRIEF HISTORY:  The patient is a 25 year old male with known diabetes type 1  who presents with a 3-day history of progressive dehydration, symptoms of  vomiting, nausea, and anorexia.  The patient has also apparently been poorly  compliant with his administration of insulin.  His symptoms started  approximately 2 days ago with some abdominal pain and nausea, dizziness, and  weakness.  Earlier today he drank a quart of orange juice which he states  was the only thing that he could keep down and that made him feel better.  Resultingly, his sugar was well over 700.   The patient was seen in the emergency room and noted to be mildly acidotic  and responded well to intravenous insulin and IV rehydration, approximately  2 L.  His sugar was down to 400 at which time I was contacted for further  arrangements.  The patient's regular physician, Dr. Tobin Chad, refused to come  in to admit the patient and recommended the patient be transferred to [facility]  I was contacted by the emergency room physician to arrange for local  admission at the mother's request.   PAST MEDICAL HISTORY:  One hospitalization approximately 1 year ago at  [facility] for his initial diagnosis of diabetes.   ALLERGIES:  No known drug allergies.   MEDICATIONS:  Insulin but the mother is unsure of dosing.  Mother states  that he does take both N and R Insulin.   SOCIAL HISTORY:  The patient's mother is a single mother with two other  children.   FAMILY HISTORY:  Family history is  significant for father and paternal  grandfather with diabetes, insulin dependent.   REVIEW OF SYSTEMS:  The patient has had no cough, fever, URI symptoms, or  unusual rashes.  He has had some vomiting without diarrhea.  Abdominal pain  is generalized.  He has had some weakness and shakes with apparent low as  well as high sugar readings.  The mother describes that he has been rather  poorly compliant with his insulin regimen on occasion missing his b.i.d.  dosing and only taking his insulin sporadically once a day.   PHYSICAL EXAMINATION:  GENERAL:  The patient is in no distress.  He is alert  and cooperative.  He does appear weak and tired.  He has a mild resting  tremor.  He is able to eat and drink without problems in my presence.  HEENT:  Mucous membranes are dry.  NECK:  Supple with no adenopathy; thyroid is normal to palpation.  LUNGS:  Clear.  HEART:  Regular and mildly tachycardic.  ABDOMEN:  Mildly obese and nontender.  EXTREMITIES:  No edema.  SKIN:  No rash.   LABORATORY STUDIES:  WBC is mildly elevated at 14,200 with 84% neutrophils.  His sodium is 135, potassium 5.3, chloride 96, and carbone dioxide is 14.  His glucose is 742 initially, more recently upon arrival to the floor it is  300+.  His BUN is 25, creatinine is mildly elevated at 2.1.  ABG shows a pH  of 7.25, pCO2 of 28, pO2 of 113 and a bicarbonate of 12.5.   IMPRESSION:  1. Ketoacidosis, metabolic acidosis.  2. Diabetes type 1.  3. Dehydration.   PLAN:  Plan will be to rehydrate intravenously as well as orally.  The  patient will be placed on sliding scale insulin.  When his glucose level  drops below 200, we will provide some glucose intravenously to avoid  hypoglycemia and further ketoacidosis.  We will obtain his previous dosing  of insulin from his mother tomorrow and adjust his insulin accordingly.  I  would like to have him on a single type of insulin 70/30 to be given on a  b.i.d. schedule for  easier compliance.  The patient and his mother will need  further diabetic teaching and potentially outpatient counseling to help  support the importance of improving his diabetic management.  I will also  obtain frequent electrolytes and hemoglobin A1C level.  Note that he has had  his routine eye examinations by Dr. Milana Huntsman.                                               Micah Flesher. Cleta Alberts, D.O.    SJ[facility]/MEDQ  D:  10/21/2002  T:  10/21/2002  Job:  [id number]

## 2011-02-05 NOTE — H&P (Signed)
[name], [name]           ACCOUNT NO.:  [id number]   MEDICAL RECORD NO.:  [id number]          PATIENT TYPE:  INP   LOCATION:  IC07                          FACILITY:  [facility]   PHYSICIAN:  Audria Nine, M.D.DATE OF BIRTH:  04-10-1986   DATE OF ADMISSION:  05/16/2005  DATE OF DISCHARGE:  LH                                HISTORY & PHYSICAL   PRIMARY CARE PHYSICIAN:  Loralyn Freshwater   ADMITTING DIAGNOSIS:  1.  Severe ketoacidosis in a type 1 diabetic patient.  2.  Poor compliance to diabetes treatment.  3.  Acute renal failure, likely related to diabetic      ketoacidosis/dehydration.   Nathaniel [name] is an 25 year old African-American male with history of type  1 diabetes since the age of 25.  He has had multiple admissions for diabetic  ketoacidosis, the most recent on March 21, 2005 for which he was admitted at  [facility].  He was initially seen here in [facility] that  evening.  He had a pH of 6.9 and bicarb of 5. He was transferred to [facility] due  to lack of hospital bed. He was hospitalized for about 8 days.  He did  fairly well, was discharged.  He was also found to have asymptomatic  bacteruria at the time.   His last hemoglobin A1c is 18.  Patient is very poorly compliant to his  medications.  He was accompanied to the ERt by his mother.  It was difficult  to obtain any information from him, as he was lethargic and quite weak.   REVIEW OF SYSTEMS:  Difficult to obtain but is as mentioned in history of  present illness above.  He was brought to the hospital because he was  increasingly lethargic and weak.  He was brought in by his mother.  No fever  or chills.  No cough.  No shortness of breath.   PAST MEDICAL HISTORY:  1.  Diabetes mellitus type 1 since age 25.  2.  Recurrent diabetic ketoacidosis with hospitalization.  3.  Cataract extraction, both eyes, posttraumatic on the right eye.   MEDICATIONS:  The mother did not know the strengths of his  medications but  review of his latest discharge notes indicated the following:  1.  Lantus insulin 48 units subcutaneous q.a.m.  2.  NovoLog insulin 8 units at each meal for 3 carbs at the meal, and to      subtract 3 units for each carb.  He was also placed on a sliding scale      for a blood glucose of 150 to 200, to add 3 units to his NovoLog.  From      200 to 250, to add 4 units.  From 250 to 300, add 6 units.  Blood      glucose over 300, to add 10 units.  3.  He also completed a 10-day course of Diflucan 100 mg daily and      ciprofloxacin 500 mg b.i.d.   ALLERGIES:  He has no known drug allergies.   SOCIAL HISTORY:  Patient smokes about one pack of  cigarettes a day.  He has  tried to quit in the past, unsuccessfully.  Denies any alcohol use.  Denies  illicit drug use.  He is a Audiological scientist.   FAMILY HISTORY:  Fairly significant for diabetes on the father's side.  His  father has been diabetic since age 25  Maternal grandmother also is  diabetic.  He has three other siblings who are not diabetic.   PHYSICAL EXAMINATION:  GENERAL:  Patient is ill-looking and quite lethargic  and severely dehydrated.  VITAL SIGNS:  Blood pressure was 115/94, pulse 117, respiratory rate is 32.  Oxygen saturation is 100% on room air.  Temperature 98.5.  HEENT:  Normocephalic, atraumatic.  Oral mucosa was dry.  No exudates or  thrush was noted.  NECK:  Supple.  No JVD, no lymphadenopathy.  LUNGS:  Clear clinically with good air entry bilaterally with no crackles,  wheezing or rhonchi.  HEART:  S1, S2, regular.  No S3, S4, gallops or rubs.  No murmurs.  ABDOMEN:  Soft, nontender.  Bowel sounds were positive.  No masses palpable.  EXTREMITIES:  No pitting pedal edema.  No calf induration or tenderness.  Dorsalis pedis pulses are palpable bilaterally.  CNS:  Patient was lethargic, conscious, arousable but not able to maintain  attention or conversation.  He has no focal neurologic  deficits.   LABORATORY DATA:  His blood gas pH was 6.944 with a PCO2 of 6.6, PO2 was  140, bicarbonate 1.4.  His base deficit was 29.1.  White blood cell count  was 19.1, hemoglobin 16.8, hematocrit of 49.7, platelet count was 422.  He  had a left shift with absolute neutrophil count of  13.8.  Sodium was 135,  potassium 5.6, chloride of 96, CO2 is less than 5.  His blood glucose was  597, BUN of 18, creatinine 2.  Calcium is 8.7.  His acetone level was  moderate.  Urinalysis was positive for glucose and a small amount of protein  and positive for ketones, negative for leukocytes, negative for nitrites.  His urine microscopy was unremarkable.   A chest x-ray was pending.   ASSESSMENT AND PLAN:  Nathaniel [name] is an 25 year old type 1 diabetic  patient for about 5 years now, who is poorly compliant with his insulin  regimen.   He was only admitted about a month ago in [facility] for severe  diabetic ketoacidosis.  Plan is to admit him to the intensive care unit and  we will rehydrate him aggressively.  I will give him two liters of fluid to  start and then continue normal saline at 250 cc an hour.  I will monitor his  basic metabolic panel, especially his potassium and his bicarbonate, every  two hours.  Patient was started on a bicarbonate infusion in the emergency  room.  I do think that once we have him on insulin and aggressive fluid  therapy, this may be discontinued.  We will check his blood glucose every  hour.  I do not see any evidence of infection that may have precipitated the  diabetic ketoacidosis other than poor compliance.  Will hold off any  antibiotic use at this time.   I have discussed the above plan with the mother and also explained to her  that this is life threatening in view of his severe acidosis.  The  reversibility was also discussed.  We will work on smoking cessation once patient is more awake.  We will give  Phenergan p.r.n. for nausea and   vomiting and Protonix for GI prophylaxis.  DVT prophylaxis will be with  Lovenox.      Audria Nine, M.D.  Electronically Signed     AM/MEDQ  D:  05/16/2005  T:  05/16/2005  Job:  [id number]   cc:   Micah Flesher. Wray Kearns  Fax: [phone]

## 2011-02-05 NOTE — H&P (Signed)
NAME:  Nathaniel [name], RUMPF NO.:  [id number]   MEDICAL RECORD NO.:  [id number]          PATIENT TYPE:  EMS   LOCATION:  ED                            FACILITY:  [facility]   PHYSICIAN:  Bonnielee Haff, MD     DATE OF BIRTH:  Nov 08, 1985   DATE OF ADMISSION:  08/24/2005  DATE OF DISCHARGE:  LH                                HISTORY & PHYSICAL   PRIMARY CARE PHYSICIAN:  Micah Flesher. Neva Seat, FACOP   ENDOCRINOLOGIST:  Elayne Snare, M.D.   Patient also sees an ophthalmologist.   ADMISSION DIAGNOSES:  1.  Severe diabetic ketoacidosis.  2.  Diabetes x5 years.   CHIEF COMPLAINT:  Feeling unwell and high blood sugar for the past two days.   HISTORY OF PRESENT ILLNESS:  Patient is a 25 year old African-American male  who was doing well until about two days ago when he started feeling unwell.  He started having abdominal pain, cramping with nausea and vomiting.  He  denied any fevers or chills.  He mentioned that he got a flu shot on Monday  at Dr. Antonietta Barcelona office.  No history of any dysuria, cough, chest pain, any  diarrhea, constipation.  No history of any joint swelling.  No history of  any sick contacts.  Patient mentioned that his blood sugars have been  running in the 400s for the past 90 days.  According to his mother, they  always run very high.  He states that he is being compliant with his Lantus;  however, he has not been taking his NovoLog, as ordered.   HOME MEDICATIONS:  1.  Lantus insulin 58 units every day.  2.  NovoLog on sliding scale basis.   No known drug allergies.   PAST MEDICAL HISTORY:  Type 1 diabetes for the past five years.  No other  medical problems.   Patient has ophthalmologically placed implants for vision correction,  otherwise no other surgical history.   SOCIAL HISTORY:  He lives in [place] with his mother.  He is a Proofreader.  He smokes a little bit less than half-pack per day.  No  history of alcohol use.  No history of  drug use.   FAMILY HISTORY:  Father had type 1 diabetes.  He died recently, about three  weeks ago.  He had renal failure requiring dialysis.  His grandfather as  well as great-grandfather were both diagnosed with diabetes and died at  early age.  Otherwise, no other medical problems in the family.   REVIEW OF SYSTEMS:  A 10-point review of systems was done, which was  unremarkable, except as mentioned in the HPI.   PHYSICAL EXAMINATION:  VITAL SIGNS:  Temperature unrecorded.  Blood pressure  158/90, heart rate 133 and regular, respiratory rate 18, saturating at 100%  on room air.  GENERAL:  A well-developed and well-nourished young male in no apparent  distress but slightly drowsy and confused.  HEENT:  There is no pallor.  No icterus.  Oral mucous membranes are very  dry.  No oral lesions seen.  NECK:  Soft and supple.  LUNGS:  Clear to auscultation bilaterally.  CARDIOVASCULAR:  S1 and S2.  Normal.  There is no murmurs or rubs  appreciated.  No S3 or S4.  ABDOMEN:  Soft, nontender, nondistended.  Bowel sounds are present.  No mass  or organomegaly appreciated.  EXTREMITIES:  Without edema.  Peripheral pulses palpable.   LAB DATA:  CBC shows a white count of 13.4.  Differential not available.  Hemoglobin 17.5, platelet count 303.  Actually, differential is available.  On the white count, she has slight neutrophilia.  CMP shows sodium 130,  potassium 5, chloride 94, bicarb 4, glucose 735, BUN 17, creatinine 2.  Total bilirubin 2.2.  Other LFTs normal.  Large acetone.  His anion gap is  about 32.  Corrected sodium is about 140.  No UA is available.   IMPRESSION:  This is a 25 year old African-American male with type 1  diabetes, who is presenting with severe diabetic ketoacidosis.  No  precipitating factors identified.  Patient did get a flu shot on Monday.  He  had been somewhat noncompliant with his NovoLog, but he denies any  noncompliance with his Lantus.   PLAN:  1.   Diabetic ketoacidosis:  We will admit the patient to the intensive care      unit overnight.  Monitor his CBGs q.1h.  BMPs every 3 hours.  We will      put him on an insulin drip, which has already been started in the ER.      He has been given 2 liters of normal saline bolus.  We will put him on      normal saline at 250 cc/hr and change it over to D5 half normal saline      once the CBG comes down to less than 250.  His electrolytes will be      monitored closely and replaced as needed.  Once his acidosis resolves,      he may be switched over to his Lantus.   1.  Renal failure:  It is likely because of dehydration and prerenal      azotemia.  With IV hydration, this should improve.   Further management and disposition will be based on his initial testing and  patient's response to treatment.      Bonnielee Haff, MD  Electronically Signed     GK/MEDQ  D:  08/24/2005  T:  08/24/2005  Job:  [id number]   cc:   Micah Flesher. Wray Kearns  Fax: [phone]   Elayne Snare, M.D.  Fax: [phone]

## 2011-02-05 NOTE — Discharge Summary (Signed)
Nathaniel [name], Nathaniel [name]           ACCOUNT NO.:  [id number]   MEDICAL RECORD NO.:  [id number]          PATIENT TYPE:  INP   LOCATION:  IC10                          FACILITY:  [facility]   PHYSICIAN:  Debbe Odea, M.D.     DATE OF BIRTH:  03/12/1986   DATE OF ADMISSION:  08/24/2005  DATE OF DISCHARGE:  LH                                 DISCHARGE SUMMARY   PRIMARY CARE PHYSICIAN:  Micah Flesher. Neva Seat, FACOP   ENDOCRINOLOGIST:  Elayne Snare, MD, in [place].   DISCHARGE DIAGNOSES:  1.  Severe diabetic ketoacidosis.  2.  Insulin-dependent diabetes mellitus, type 1, severely uncontrolled.   DISCHARGE MEDICATIONS:  1.  The patient was previously receiving 58 units of Lantus at bedtime.  I      have increased this to 60 units at bedtime temporarily as his morning      blood sugar was slightly elevated at 237.  2.  He is to continue on his NovoLog sliding scale a.c. and h.s.  He has not      provided me with a copy of his scale, otherwise, I might have adjusted      it for him.  Therefore, he is to continue it as he is previously taking.   HOSPITAL COURSE:  This is a 25 year old African American male who has been  admitted for DKA.  On admission, his pH was 6.965.  His bicarbonate was 13,  and his sodium was low at 132.   He had large acetones in his blood.  His urine was positive for glucose,  ketones, protein, and granular casts.   His hemoglobin A1c was checked and found to be extremely elevated at 17.9.  On further discussion with his mother, she stated that his previous  hemoglobin A1c had been 14 which means, therefore, that his control is  actually getting worse.   The patient was admitted and started on an insulin drip and received  multiple boluses of normal saline.  His blood sugars came under control  under the next 12 to 18 hours, and he was switched over to an insulin  sliding scale and his home dose of Lantus.  He has done well through the  night.  His blood sugars  have ranged from in the 100s, and his highest blood  sugar was actually this morning which was 237.   He is not complaining of any nausea, vomiting, or abdominal pain.  He had no  signs of infection on admission.  He continues to be afebrile.  He has no  leukocytosis.  His urine was negative for bacteria, and his chest x-ray was  clear.   The patient has been counseled on compliance with diet and medication in  order to prevent severe organ failure over the next 20 years.  He also  states that his Glucometer was reading high for 2 days before his admission  to the hospital.  I have advised him that if this is to happen again, he is  to call either his primary care physician or Dr. Dwyane Dee and receive advice on  adjusting his insulin  to help bring down his blood sugar before he actually  goes into full-blown DKA.  He states that he has an appointment with Dr.  Dwyane Dee in about 1 week's time and therefore can have his insulin doses  adjusted at that time.  I have asked him tocheck his blood sugar and write  them in a notebook and take it to the doctor's office.   BLOOD WORK ON DISCHARGE:  WBC count 5.4, hemoglobin 11.8, hematocrit 33.8.  This low hemoglobin may be secondary to IV fluids he had been receiving.  He  was in positive balance yesterday by about 1.5 liters; however, it should be  followed up as an outpatient.  Platelets 173.  Sodium 137, potassium 3.8,  chloride 111, bicarbonate 21, glucose 204, BUN 9, creatinine 0.9, calcium  8.3.  Hemoglobin A1c 17.9.   VITAL SIGNS:  Temperature 98.8, blood pressure 100/70, pulse 78, respiratory  rate 16, pulse oximetry 99% on room air.   DISCHARGE INSTRUCTIONS:  1.  He is to comply with a diabetic diet.  We did have a nutritionist talk      to him about complying with diet, and he did admit to her that he often      forgets to take his insulin and checks his blood sugar only once a day.      He states that his usual blood sugars are about  400.  2.  He is to follow up with Dr. Dwyane Dee as per appointment on the following      Monday.  3.  He is to follow up with Dr. Cleta Alberts as per previous appointment.  4.  He has been strictly advised to comply with diet and medications.      Debbe Odea, M.D.  Electronically Signed     SR/MEDQ  D:  08/26/2005  T:  08/26/2005  Job:  [id number]   cc:   Micah Flesher. Wray Kearns  Fax: [phone]   Elayne Snare, M.D.  Fax: [phone]

## 2011-02-05 NOTE — Discharge Summary (Signed)
Nathaniel, [name] NO.:  [id number]   MEDICAL RECORD NO.:  [id number]          PATIENT TYPE:  INP   LOCATION:  [id number]                         FACILITY:  [facility]   PHYSICIAN:  Sherryl Manges, M.D.  DATE OF BIRTH:  06/04/1986   DATE OF ADMISSION:  03/20/2005  DATE OF DISCHARGE:  03/23/2005                                 DISCHARGE SUMMARY   PRIMARY CARE PHYSICIAN:  Laurice Record, MD, Linna Hoff.   DISCHARGE DIAGNOSES:  1.  Type 1 diabetes mellitus, complicated by recurrent diabetic      ketoacidosis.  2.  Asymptomatic bacteruria/candiduria.   DISCHARGE MEDICATIONS:  1.  Lantus insulin 48 units subcutaneously q.a.m.  2.  NovoLog insulin 8 units at each meal (for 3 carbs at the meal), subtract      3 units for each carb.  3.  Sliding scale for CBG 150 to 200, add 3 units; CBG 200 to 250, add 4      units; CBG 250 to 300, add 6 units; CBG over 300, add 10 units.  4.  Diflucan 100 mg p.o. daily for a total of 10 days treatment.  5.  Ciprofloxacin 500 mg p.o. b.i.d. for a total of 7 days.   PROCEDURES:  1.  Portable chest x-ray dated March 20, 2005, no acute disease.  2.  Abdominal ultrasound scan dated March 21, 2005.  This showed hepatomegaly.      No gallbladder disease, gallbladder dilatation, slight increased      echogenicity.  No hydronephrosis.   CONSULTATIONS:  Elayne Snare, M.D., endocrinologist.   ADMISSION HISTORY:  As in H&P notes of March 21, 2005.  However, in brief,  this is an 25 year old male, with a known history of Type 1 diabetes  mellitus, since age 54, who presented to [facility] on March 20, 2005, with biochemical features of diabetic ketoacidosis, pH of 6.9,  bicarbonate 5.  He was given bicarbonate supplements and transferred over to  the intensive care unit of [facility] for management.  Apparently  he had experienced nausea, vomiting, and epigastric pain.  He has a history  of recurrent episodes of diabetic ketoacidosis  and was admitted for further  evaluation, investigation, and management.   CLINICAL COURSE:  #1.  TYPE 1 DIABETES MELLITUS COMPLICATED BY DIABETIC KETOACIDOSIS:  The  patient was admitted to the intensive care unit, managed by aggressive  intravenous fluid hydration as well as intravenous insulin infusion, by  [place] protocol.  Electrolytes were closely monitored, and  abnormalities managed as indicated.  In view of GI symptoms of vomiting and  abdominal pain, the patient had abdominal ultrasound scan which proved to be  unremarkable.  Chest x-ray also done as part of septic work-up was negative.  Urinalysis demonstrated bacteruria and candiduria for which patient was  treated with ciprofloxacin and Diflucan.  He is expected to complete a 7-day  course of ciprofloxacin, as well as 10-day course of Diflucan.  Given the  patient's young age, his history of recurrent DKA, and obviously poor  glucose control, it was felt that endocrinology imput was indicated.  Consultation  was kindly provided by Dr. Elayne Snare who directed patient's  diabetic treatment and plans to follow patient up on outpatient basis.  It  is note worthy that patient's creatinine which was 1.9 at the time of  admission, has as of March 23, 2005, normalized at 0.7.   DISPOSITION:  By March 23, 2005, the patient was completely asymptomatic.  His  blood glucose levels had normalized with CBG of 160.  His anion gap had  closed and as of now, is 4 by calculation.  He had no other symptomatology.  After discussion with Dr. Dwyane Dee, it was felt that he was sufficiently stable  to be discharged in satisfactory condition.  He had a mild hypokalemia of  3.2 which was supplemented with KCl.   ACTIVITY:  As tolerated.   DIET:  Carbohydrate modified.   WOUND CARE:  Not applicable.   FOLLOW UP INSTRUCTIONS:  The patient is to follow up with his primary MD,  Dr. Alver Fisher, in [place], [place], routinely.  He is to call  for  an appointment, and he is agreeable to this.  He is also to follow up with  Dr. Elayne Snare, telephone number [phone], within one week.  Instructions  have been communicated to patient, who has verbalized understanding.       CO/MEDQ  D:  03/29/2005  T:  03/29/2005  Job:  [id number]   cc:   Tammi Sou, MD  [place] [address]  Fax: [phone]   Elayne Snare, M.D.  [id number]  [place] [address]  Fax: [phone]

## 2011-02-05 NOTE — Consult Note (Signed)
NAMEMELBA, VISAYA           ACCOUNT NO.:  [id number]   MEDICAL RECORD NO.:  [id number]          PATIENT TYPE:  INP   LOCATION:  [id number]                         FACILITY:  [facility]   PHYSICIAN:  Elayne Snare, M.D.       DATE OF BIRTH:  [birth year]   DATE OF CONSULTATION:  03/22/2005  DATE OF DISCHARGE:                                   CONSULTATION   REASON FOR CONSULTATION:  Diabetes mellitus.   HISTORY:  This is an 25 year old diabetic patient who has had type 1  diabetes since the age of 52.  He apparently was admitted on the evening of  March 20, 2005, with diabetic ketoacidosis with typical acidotic blood gases,  a blood sugar of 592, and bicarbonate of 6.  He had presented with nausea  and vomiting.  Apparently, this is the second episode of ketoacidosis as he  had one last month also.   The patient apparently is very noncompliant with monitoring his blood  sugars, although he says for a few days before his last admission he was  monitoring them.  He claims that his blood sugars are usually between about  200 and 300, most of the time when he checks it, although he does not check  it at bedtime or after supper.  His A1C test was apparently 18 last month.  He is supposed to take 40 units of Lantus at bedtime which he usually does,  and he takes an unknown sliding scale of insulin at mealtimes.  He takes  about 10 units as a base dose, and additional units also.  He has been on  Lantus for a few months, and previously was on insulin 70/30 which  presumably was not controlling his sugars.   The patient has only recently been seen by a dietitian and has been  introduced to the concept of meal planning and carbohydrate counting.  He  says he has not followed the carbohydrate counting as yet as he is not  comfortable doing this;  however, he has been scheduled for further followup  classes through [facility].   The patient says that he is afraid of hypoglycemia which apparently  has  happened when he takes his Novolog and does not eat.  He has been instructed  to take his Novolog right at mealtimes.  He has not been introduced to the  idea of an insulin pen or even a pump.   Currently, the patient's ketoacidosis has improved and he is back on a diet;  however, his CO2 this morning was still low at 17.  His blood sugar at 6  a.m. this morning was 280.  He has been getting 40 units of Lantus as of  yesterday, and a sliding scale of insulin which gives him between 3 and 20  units for blood sugars over 100.  This morning at breakfast he got 12 units  with which his blood sugar came down from 280 down to 167.  Currently, he  does not feel nauseated and is alert.   PAST MEDICAL HISTORY:  Traumatic cataract extraction in the right eye.  ALLERGIES:  No known drug allergies.   SOCIAL HISTORY:  He smokes two packs a day.   FAMILY HISTORY:  There is a strong family history of diabetes, and  apparently his father had been a type 1 diabetic also.  He does not know  much of his other family history.   REVIEW OF SYSTEMS:  He has not had any hypertension or other known medical  problems.   PHYSICAL EXAMINATION:  GENERAL:  The patient is alert and cooperative.  VITAL SIGNS:  He weighs about 145 pounds.  HEENT:  His eyes are normal externally, fundi not examined.  ENT exam shows  mucous membranes were normal.  Normal oral hygiene.  NECK:  Normal and thyroid is not enlarged.  HEART:  Heart sounds are normal.  LUNGS:  Clear.  ABDOMEN:  No tenderness or mass.  EXTREMITIES:  Normal.   ASSESSMENT:  This young patient has poorly controlled diabetes, clearly  taking an inadequate amount of insulin at home and currently is not getting  adequate basal or bolus coverage with his meals.   PLAN:  Increase Lantus to 50 units until his fasting blood sugar comes down.  We will also had a standard amount of 8 units of Novolog for each meal since  he is generally getting 45 g of  carbohydrates per meal which in his case  would require about 1 unit for every 8 g to cover.  We will also leave a  sliding scale for higher sugars if need be.       AK/MEDQ  D:  03/22/2005  T:  03/22/2005  Job:  [id number]   cc:   Aquilla Hacker, M.D.   Tammi Sou, MD  [place]  [place] [address]  Fax: [phone]

## 2011-02-05 NOTE — Discharge Summary (Signed)
NAME:  Nathaniel [name], HAPNER                     ACCOUNT NO.:  [id number]   MEDICAL RECORD NO.:  [id number]                   PATIENT TYPE:  INP   LOCATION:  A327                                 FACILITY:  [facility]   PHYSICIAN:  Micah Flesher. Halm, D.O.                DATE OF BIRTH:  29-Oct-1985   DATE OF ADMISSION:  10/21/2002  DATE OF DISCHARGE:  10/25/2002                                 DISCHARGE SUMMARY   FINAL DIAGNOSES:  1. Hyperglycemia.  2. Type 1 diabetes mellitus.  3. Dehydration.  4. Ketoacidosis.   BRIEF HISTORY:  The patient is a known diabetic, having had his care done  previously by Dr. Cristal Deer as well as doctors at [facility].  He  presented apparently with a number of days of progressive dehydration,  vomiting, nausea, and anorexia.  He has apparently been poorly compliant  with his insulin regimen on and off.  In the emergency room he had a glucose  well over 700.  I admitted the patient as a courtesy to the hospital and the  emergency room, as he was not originally my patient and I was not on call  for unassigned patients.   HOSPITAL COURSE:  The patient was admitted to the hospital and placed on IV  fluids, initially receiving saline and then converted over to half normal  saline with potassium.  He was initially given IV insulin and quickly  changed to sliding scale regular Humulin.  His sugars improved slowly over  the course of his hospitalization.  His IV fluids were weaned.   The patient was eventually placed on a b.i.d. schedule of 70/30 insulin.  He  seemed to do well with this and had some diabetic teaching performed while  in the hospital - both for the patient and his mother.   I am concerned over the compliance of the patient, given his presentation.  We will work on this as an outpatient and I have agreed to continue caring  for him as an outpatient.   Laboratory studies of interest during this hospitalization include a  presenting pH of 7.25 with  a PCO2 of 28 and a bicarbonate of 12.5.  Initial  glucose 742.  Hemoglobin A1c was 12.9%.   DISCHARGE INFORMATION:  1. The patient was discharged in stable condition on the following     medications:  Humulin 70/30 insulin 80 units in the morning, 40 units in     the evening.  2. I also provided a prescription for Glucagon injection to be used by the     guardian or parent if the patient is unconscious and unable to drink in     the case of hypoglycemia.  The mother had apparently not been advised and     educated in the use of Glucagon and was unaware of its existence.  3. The patient was asked to follow up in my office in one week -  on Tuesday,     October 30, 2002 at 11:30 a.m.                                              Micah Flesher. Cleta Alberts, D.O.   SJ[facility]/MEDQ  D:  11/09/2002  T:  11/09/2002  Job:  [id number]

## 2011-02-05 NOTE — H&P (Signed)
NAME:  Nathaniel [name], Nathaniel [name]           ACCOUNT NO.:  [id number]   MEDICAL RECORD NO.:  [id number]          PATIENT TYPE:  INP   LOCATION:  IC10                          FACILITY:  [facility]   PHYSICIAN:  Audria Nine, M.D.DATE OF BIRTH:  03/15/1986   DATE OF ADMISSION:  11/13/2005  DATE OF DISCHARGE:  LH                                HISTORY & PHYSICAL   PRIMARY CARE PHYSICIAN:  Micah Flesher. Cleta Alberts, M.D.   ENDOCRINOLOGIST:  Elayne Snare, M.D., in [place].   ADMITTING DIAGNOSIS:  1.  Severe diabetic ketoacidosis.  2.  Poor compliance to diabetes treatment.  3.  Acute Renal Failure, likely related to diabetic      ketoacidosis/dehydration.  4.  Asymptomatic bacteruria.   CHIEF COMPLAINT:  Chest pain and generalized weakness of one day duration.   HISTORY OF PRESENT ILLNESS:  Mr. Rittenberry is a 25 year old African-  American male with type 1 diabetes since the age of 89.  He has had multiple  admissions for diabetic ketoacidosis, most recent being here at [facility] in December 2006.  He has a history of very poor compliance and  does not take his medications as prescribed.  The patient reports that he  has not eaten in two days and still took his lantus last night which I find  hard to believe.   His last hemoglobin A1C in December was  18.   He complains of some chest pain, diffuse pressure-like.  No shortness of  breath, no cough.  He has no radiation.  He describes the pain as a 4-6/10.  He has no fever or chills, no headaches, dizziness, light-headedness.  Denies any nausea, vomiting, abdominal pain, constipation or diarrhea.  He  has no frequency, urgency or dysuria.   He continues on Lantus insulin at 50 U twice a day which I do not think  patient has been using.   REVIEW OF SYSTEMS:  Ten point review of systems is otherwise negative except  as mentioned in the history of present illness.   MEDICATIONS:  1.  Lantus insulin 50 units subcu twice a day.  2.   NovoLog on a sliding scale.   ALLERGIES:  No known drug allergies.   PAST MEDICAL HISTORY:  1.  Type 1 diabetes.  2.  Multiple admissions for diabetic ketoacidosis.  3.  He had ophthalmologically placed implants for vision correction;      otherwise no other surgical history.   SOCIAL HISTORY:  Lives in [place] with his mother.  He is Buyer, retail.  He smokes about a half a pack a day.  He denies any alcohol  abuse or illicit drug use.   FAMILY HISTORY:  Father had type 1 diabetes, he died last year around  2023/09/01.  He had renal failure and was on dialysis.  His grandfather and  great-grandfather were both diagnosed with diabetes and died at an early  age.   PHYSICAL EXAMINATION:  The patient was conscious, alert, and comfortable, in  no acute distress.  VITAL SIGNS:  Blood pressure 158/91, pulse 103, respiratory rate 32,  temperature 94.3.  Saturation 100% on room air.  HEENT:  Normocephalic, atraumatic.  Oral mucosa is moist with no exudates.  NECK:  Supple.  No JVD, no lymphadenopathy.  LUNGS:  Clear clinically with good air entry bilaterally.  HEART:  S1, S2.  Regular rate.  No murmur, gallops or rubs.  ABDOMEN:  Soft and nontender.  Bowel sounds positive.  No masses palpable.  EXTREMITIES:  No pitting pedal edema.  No calf induration or tenderness was  noted.  CNS:  The patient was lethargic but conscious and well oriented to time,  place, and person.  He had no focal neurological deficits.   LABORATORY DATA:  Blood gas showed a pH of 6.774, FIO2 of 21%, PCO2 7.2, PO2  158, bicarb 1.0.  WBC 22.6, hemoglobin 17.4, hematocrit 51.2, platelet count  420.  Neutrophils 16.7.  Sodium 130, potassium 5.9, chloride 104, CO2 4,  glucose 461, BUN 15, creatinine 1.6.  Total bilirubin 1.4, alkaline  phosphatase 126.  The rest of LFTs were normal.  Urinalysis was negative.  Urine microscopy showed some granular casts and many bacteria.   Chest x-ray was negative.   Twelve lead EKG is normal .   ASSESSMENT/PLAN:  This is a 25 year old African-American male with type 1  diabetes with a history of noncompliance, multiple admissions for diabetic  ketoacidosis with severe acidosis and pH usually less than 7.  The patient  presents again in severe diabetic ketoacidosis.  Compliance remains suspect.  We will put him on an insulin infusion and hydrate him.  We will treat him  for his asymptomatic bacteruria with Cipro. I would check a urine toxicology  screen on him.  The importance of compliance with his medications was again  emphasized and I think the diabetic educator will need to help out here  including possibly having a family conference, meeting with his parents to  see how we can better help him with his blood sugar control.  I also wonder  if he is a candidate for insulin pump to further help him deal with his  diabetes.   We will monitor his B Met every 2 hours and correct the potassium as needed.      Audria Nine, M.D.  Electronically Signed     AM/MEDQ  D:  11/13/2005  T:  11/14/2005  Job:  [id number]

## 2011-02-05 NOTE — H&P (Signed)
NAMEDAMANI, TOULOUSE           ACCOUNT NO.:  [id number]   MEDICAL RECORD NO.:  [id number]          PATIENT TYPE:  INP   LOCATION:  A225                          FACILITY:  [facility]   PHYSICIAN:  Tammi Sou, MD  DATE OF BIRTH:  01-07-1986   DATE OF ADMISSION:  10/02/2004  DATE OF DISCHARGE:  LH                                HISTORY & PHYSICAL   PRIMARY MDS:  Tammi Sou, MD and Micah Flesher. Halm, D.O.   CHIEF COMPLAINT:  Hyperglycemia.   HISTORY OF PRESENT ILLNESS:  Fernandez is an 25 year old African-American male  with type 1 diabetes with a history of poor control who has a long history  of medical noncompliance.  He has recently been evaluated by an  ophthalmologist for a right eye cataract.  This has been determined to be a  severe hypermature cataract with significant lens swelling and risk for  secondary glaucoma.  Rastus was being evaluated for surgery and was noted to  have a cough and a low-grade fever and was sent to me for further  evaluation.   I evaluated him in my clinic on October 01, 2004 which is the first time  that I had seen in several months.  He reported a recent 5-6 days of a runny  nose and a cough and temperature around 100 the last day or two.  He did say  he felt better that day. He denied wheezing or shortness of breath. He does  smoke about 1/4 of a pack of cigarettes a day.  He says that he checks his  sugars approximately 1 time per day, about 11 a.m. which apparently is about  the time he wakes up.  He notes that it is in the 90-100 range the majority  of the time.  He says that he feels like his diabetes is in good control. He  has no other acute complaint.   REVIEW OF SYSTEMS:  Positive for significant decreased vision in the left  eye; essentially no vision in the right eye.  Additionally he feels like his  left ear is itchy and drains.  He admits to polyuria, but no polydipsia or  polyphagia.  No headache, no sore throat, no chest pain,  no abdominal pain,  no nausea, vomiting, or diarrhea.  No lower extremity numbness or tingling.  No lower extremity swelling. No rash.  He had been eating and drinking  normally, which for him is about 2 meals per day.   PAST MEDICAL HISTORY:  1.  Diabetes mellitus type 1.  2.  Recent right eye cataract, severe.  3.  Left eye cataract, small.  (No history of chronic inhaled or oral      steroid use).   PAST SURGICAL HISTORY:  None.   MEDICATIONS:  1.  Insulin 70/30, 100 units subcu q.a.m. and 40 units subcu q.p.m.  2.  Beginning on December 6, Advair 250/50 1 puff b.i.d.  3.  Albuterol MDI 2 puffs q.4h. p.r.n.  4.  Omnicef 300 mg p.o. b.i.d.   ALLERGIES:  No known drug allergies.   SOCIAL HISTORY:  Daelin is  a senior at [employer] and lives with his  mother in [place].  He smokes 1/4 pack of cigarettes a day and denies any  intake of alcohol or drugs.  He has a younger brother who lives at home with  them.   FAMILY HISTORY:  Noncontributory.   PHYSICAL EXAMINATION:  VITAL SIGNS:  Temperature 98, pulse 97, blood  pressure 122/70, respirations 20.  Weight 166 pounds, height 70 inches.  GENERAL:  In general he is alert, well-appearing, talkative, and oriented to  person, place, time, and situation.  HEENT:  His pupils are equal and round and reacted to light and  accommodation.  There is notable leukorrhea on the right. The retina cannot  be visualized on the right.  Retinal vasculature cannot be clearly  visualized on the left as well.  Left tympanic membrane with erythema and  loss of landmarks with yellow middle ear fluid noted.  The right tympanic  membrane was erythematous.  Oropharynx was pink, moist mucosa without lesion  or exudate.  His nasal mucosa is slightly injected and boggy.  NECK:  His neck is supple with no significant lymphadenopathy.  No  thyromegaly.  LUNGS:  His lungs are clear to auscultation bilaterally with unlabored  respirations.   CARDIOVASCULAR:  His cardiovascular exam shows regular rhythm and rate  without murmur, rub, or gallop.  ABDOMEN:  His abdomen is soft, nontender, nondistended.  Bowel sounds are  normoactive.  EXTREMITIES:  Show no clubbing, cyanosis, or edema.  His lower extremity  sensation is intact.   LABS:  Basic metabolic panel:  Sodium 0000000 (this corrects to 135), potassium  4.5, chloride 90, bicarb 26, BUN 18, creatinine 1.0, glucose 751, calcium  8.8, magnesium 1.8, phosphorus 4.5.  Hemoglobin A1c on October 01, 2004 was  17.6.  Also on October 01, 2004 white blood cell count 6300 with hemoglobin  15.3 and platelets 195.  Complete metabolic panel was within normal limits  except for a glucose of 392.  TSH was 2.1, and free T4 was 1.12.   ASSESSMENT AND PLAN:  1.  Uncontrolled type 1 diabetes.  Given that this patient has a long      history of medical noncompliance and it is clearly if utmost importance      to get his glucose back within normal range to decrease the chances of      any further ocular complications, will admit for insulin drip and      diabetes education.  Will have a clear plan for outpatient glucose      control prior to discharge.  2.  Upper respiratory infection with acute otitis media.  Initially in my      office he had lung sounds consistent with bronchitis.  I now feel like      he is much improved along this line.  Will continue antibiotics in the      hospital for his ear infection and continue Advair and albuterol p.r.n.      We will not give oral steroids as previously planned (prior to this      dictation, one dose of 60mg  prednisone was already given).  3.  Right eye severe cataract, with significant loss of vison.  As described      by his ophthalmologist, Dr. Shon Hough this is a severe      hypermature cataract with significant risk for fluid leakage and     secondary glaucoma.  In her office, a B ultrasound of his  right retina      was normal.  His  intra-ocular pressure was also normal.  Therefore tight      diabetes control is necessary.   This plan has been fully discussed with patient and mom and they are in  agreement.     PHM/MEDQ  D:  10/02/2004  T:  10/02/2004  Job:  [id number]

## 2011-02-05 NOTE — H&P (Signed)
NAME:  Nathaniel [name], Nathaniel [name]           ACCOUNT NO.:  [id number]   MEDICAL RECORD NO.:  [id number]          PATIENT TYPE:  INP   LOCATION:  3312                         FACILITY:  [place]   PHYSICIAN:  Mobolaji B. Bakare, M.D.DATE OF BIRTH:  1986-04-23   DATE OF ADMISSION:  03/20/2005  DATE OF DISCHARGE:                                HISTORY & PHYSICAL   PRIMARY CARE PHYSICIAN:  Dr. Alver Fisher at [place].   CHIEF COMPLAINT:  DKA.   HISTORY OF PRESENT ILLNESS:  Nathaniel [name] is an 25 year old African-  American male with history of type 1 diabetes since the age of 66. He went  to [facility] this evening, was diagnosed with diabetic  ketoacidosis with a pH of 6.9 and bicarb of 5. He was given bicarbonate and  was transferred over the intensive care unit for further management. Mr.  [name] was well yesterday morning. He started experiencing nausea and  vomiting later in the day. He vomited twice. There was no hematemesis. There  is no diarrhea, no chills, no fever. He had accompanying epigastric pain. He  was unable to keep anything down and he called his mom who was at work and  he was taken to [facility]. Immediate treatment was given  over at [facility] and was transferred here for further management. There  has been no sick contact at home. The patient had hemoglobin A1C during  recent hospitalization in June, which was very elevated at 18. Then he was  treated for DKA at [facility]. The patient stated he is compliant  with his medication. He last used his Lantus yesterday.   REVIEW OF SYSTEMS:  There is no cough, no shortness of breath, no chest  pain. He occasionally has cramps in both legs. He has constipation. There is  no headache, no neck pain. No significant weight loss. There is no dysuria.  There has been increased frequency of micturition since yesterday.   PAST MEDICAL HISTORY:  1.  Diabetes mellitus.  2.  Recurrent DKA with  hospitalization.  3.  Cataract extraction both eyes, post traumatic on the right eye.   CURRENT MEDICATIONS:  1.  Lantus 40 units q.h.s.  2.  Sliding scale insulin.   ALLERGIES:  No known drug allergies.   SOCIAL HISTORY:  The patient smokes cigarettes, about 2 packs per week. He  has tried to quit once, unsuccessful. Does not drink alcohol. He is a Proofreader.   FAMILY HISTORY:  Significant for diabetes mellitus. Father has been diabetic  since the age of 28. Paternal grandfather and great-grandfather were  diabetic. Maternal grandmother is diabetic.   PHYSICAL EXAMINATION:  INITIAL VITAL SIGNS:  Temperature 97.5, blood  pressure 135/70, heart rate 98, O2 sats of 100% on room air, respiratory  rate of 22, weight 141 pounds.  GENERAL:  He is somewhat acutely ill looking. Not in respiratory distress.  HEENT:  Normocephalic, atraumatic head. Mucous membranes dry. Nonicteric,  not pale. No tenderness over the sinuses and no tonsillar hypertrophy or  exudate.  NECK:  No elevated JVD. No carotid bruits. Tongue is  coated.  LUNGS:  Vesicular breath sounds, no wheeze, no rhonchi.  CVS:  S1, S2, regular, no murmurs, no gallops.  ABDOMEN:  Nondistended, soft, nontender, no hepatosplenomegaly. No CVA  tenderness.  EXTREMITIES:  No pedal edema, no calf tenderness. Dorsalis pedis pulses are  equal bilaterally.  CN:  No focal neurological deficits.  SKIN:  No rash, no petechiae.  JOINTS:  No joint swelling and no erythema.   INITIAL LABORATORY DATA:  From [facility] showed CBC:  White cell  count 18,000, hematocrit 50.1, hemoglobin 17.0, platelets 255. Neutrophils  78%, lymphocytes 11%, absolute neutrophil count 14. Sodium 139, potassium  4.9, chloride 99 and bicarb 6, glucose 592, BUN 15, creatinine 1.9, calcium  9.0. Small acetone on blood. ABG pH of 6.9, PCO2 6.6, PO2 162, bicarb 1.3,  02 sats 97% on 2 L of oxygen. Urinalysis positive for nitrite, negative for   leukocytes, protein is 30, blood moderate, ketones greater than 80, glucose  greater than 1000, specific gravity 1.020. Microscopic many bacteria. Yeast  present. Urine drug screen negative for any drugs.   ASSESSMENT AND PLAN:  1.  Diabetic ketoacidosis.  2.  Type 1 diabetes mellitus.  3.  Asymptomatic bacteruria with yeast.  4.  Hyponatremia.  5.  Renal failure.  6.  Dehydration.   PLAN:  IV fluid normal saline plus 10 mEq of Kay Ciel at 250 cc per hour x2  liters then half normal saline at 150 cc per hour. Will start [place]  with parameters. Check BMET q. 4 hours. Check liver function tests,  magnesium phosphate, blood culture and urine culture. Ciprofloxacin 400 mg  IV q. 12 hours, Diflucan 200 mg IV x1 now, then 100 IV q.d., with transition  to p.o. when the patient is tolerating orally. Start on clear liquid diet.  Phenergan 12.5 mg IV q. 4 hours p.r.n. Will use Zofran p.r.n. if Phenergan  is ineffective, Protonix 40 mg IV q. day.  Tobacco Abuse - Smoking cessation counselling       MBB/MEDQ  D:  03/21/2005  T:  03/21/2005  Job:  [id number]

## 2011-02-05 NOTE — Discharge Summary (Signed)
Nathaniel [name], Nathaniel [name]           ACCOUNT NO.:  [id number]   MEDICAL RECORD NO.:  [id number]          PATIENT TYPE:  INP   LOCATION:  [id number]                          FACILITY:  [facility]   PHYSICIAN:  Debbe Odea, M.D.     DATE OF BIRTH:  25-Jan-1986   DATE OF ADMISSION:  02/21/2005  DATE OF DISCHARGE:  06/06/2006LH                                 DISCHARGE SUMMARY   DISCHARGE DIAGNOSES:  1.  Diabetic ketoacidosis.  2. Urinary tract infection.  3. Insulin-      dependent diabetes mellitus, type 1.  4. Dehydration.  5. Hypokalemia.   DISCHARGE MEDICATIONS:  1.  Lantus 40 mg at bedtime.  2. NovoLog sliding scale before each meal.      The patient has the scale at home which was prescribed by Halm.   HOSPITAL COURSE:  This is a 25 year old African-American male with a history  of insulin-dependent diabetes mellitus who is admitted for DKA.  The patient  had nausea, vomiting, was dehydrated and required a large amount of IV  fluids.  He was on an insulin drip.  The patient was acidotic on admission  with a pH of 7.025.  His bicarbonate level was 15.  On the following day the  patient had a bicarbonate level of 18.  His insulin drip was discontinued on  the following day.   Discharge blood work is sodium was 132, potassium 3.5, chloride 114, bicarb  18, glucose 131, BUN 5, creatinine 0.9, calcium 8.2.  His hemoglobin A1C was  18.6.  TSH was 0.644.  GC and Chlamydia probe were both negative.   On discharge the patient is feeling well.  He is not complaining of any  nausea, vomiting, abdominal pain.  He is not lethargic.  He is able to  tolerate a diet.  I have talked to him about checking his blood sugars  regularly and following his insulin sliding scale.  Previously the patient  had not been checking sugars and just administering insulin before breakfast  and sometimes before dinner but rarely at lunch time.  I have also told his  mother that she needs to strictly monitor her son to make  sure that he does  take his insulin syringe with him to school to administer insulin at  lunchtime.  He will be following up as an outpatient with a nutritionist for  outpatient diabetes education as requested by his mother.       SR/MEDQ  D:  03/09/2005  T:  03/09/2005  Job:  [id number]

## 2011-02-05 NOTE — Discharge Summary (Signed)
Nathaniel [name], Nathaniel [name]           ACCOUNT NO.:  [id number]   MEDICAL RECORD NO.:  [id number]          PATIENT TYPE:  INP   LOCATION:  A303                          FACILITY:  [facility]   PHYSICIAN:  Vanetta Mulders. Dechurch, M.D.DATE OF BIRTH:  1986-01-24   DATE OF ADMISSION:  11/13/2005  DATE OF DISCHARGE:  02/27/2007LH                                 DISCHARGE SUMMARY   DISCHARGE DIAGNOSES:  1.  Diabetic ketoacidosis.  2.  Type 1 diabetes with poor compliance.  3.  Asymptomatic bacteriuria.  4.  Hypokalemia.  5.  Hypophosphatemia.  6.  Tobacco abuse.   DISPOSITION:  The patient is discharged to home.   FOLLOW UP:  Follow up with Dr. Melford Aase in 1 week.  Follow up with Dr. Dwyane Dee.   SPECIAL INSTRUCTIONS:  The patient counseled regarding smoking cessation.   DISCHARGE MEDICATIONS:  1.  Lantus insulin as before 50 units q.12h. and NovoLog sliding scale as      before.  2.  Continue Cipro 500 mg t.i.d. to complete a 10-day course.   CONDITION ON DISCHARGE:  Improved.   HOSPITAL COURSE:  The patient is a 25 year old, African-American gentleman  with diabetes mellitus type 1 since age 71 who has been hospitalized  multiple times with DKA, most recently in December.  At that time, his  hemoglobin A1c was 18.9.  He presented with chest pain.  His initial pH was  6.7 with pCO2 7.2, bicarb of 4 and a glucose of 461.  He was treated  aggressively with IV fluids and electrolyte supplementation.  He was noted  to have a urinalysis with many bacteria and granular casts with 0-2 wbc's.  He was treated with Cipro empirically.  The patient  gradually improved to his baseline status.  Glucoses were reasonably  controlled.  Hemoglobin A1c was not obtained.  He was counseled on the  importance of aggressive management of his diabetes.  He returned to his  baseline status and is being discharged to home in stable condition with  followup as noted above.      Vanetta Mulders Hillery Jacks, M.D.  Electronically Signed     FED/MEDQ  D:  11/16/2005  T:  11/16/2005  Job:  [id number]   cc:   Unk Pinto, M.D.  Fax: [phone]   Elayne Snare, M.D.  Fax: [phone]

## 2011-02-05 NOTE — H&P (Signed)
NAME:  Nathaniel [name], Nathaniel [name]           ACCOUNT NO.:  [id number]   MEDICAL RECORD NO.:  [id number]          PATIENT TYPE:  INP   LOCATION:  A223                          FACILITY:  [facility]   PHYSICIAN:  Debbe Odea, M.D.     DATE OF BIRTH:  Apr 29, 1986   DATE OF ADMISSION:  02/21/2005  DATE OF DISCHARGE:  LH                                HISTORY & PHYSICAL   PRESENTING COMPLAINT:  Nausea and vomiting and weakness.   HISTORY OF PRESENT ILLNESS:  This is an 25 year old African American male  with past medical history of insulin-dependent diabetes mellitus since age  57.  The patient states that for the past two or three days he has been  having nausea and vomiting, unable to tolerate his food.  He states that he  has been taking his insulin.  However, he does not check his blood sugars.  Does not take insulin at lunchtime and usually just gives himself about 10  units before each meal.  He does not complain of any fever or chills,  shortness of breath or cough.  He does not complain of any diarrhea or  dysuria.   ALLERGIES:  No known drug allergies.   PAST MEDICAL HISTORY:  Type 1 diabetes since age 30.   PAST SURGICAL HISTORY:  He has had bilateral phalange replacement.   SOCIAL HISTORY:  He smokes one pack which lasts him three days.  Does not  drink any alcohol.  He is sexually active.  He states that his last  encounter was a few days ago in which he did not use any protection.   MEDICATIONS:  1.  Lantus 40 units at bedtime.  2.  NovoLog sliding scale which starts at 5 units for a blood sugar of 100-      150.   PHYSICAL EXAMINATION:  VITAL SIGNS:  Blood pressure 167/91, pulse 113,  respiratory rate 24, pulse oximetry 98% on room air.  HEENT:  Atraumatic, normocephalic.  Pupils are equal, round and reactive to  light and accommodation.  Extraocular muscles were intact.  Oral mucosa was  dry.  NECK:  Supple.  HEART:  Regular rate and rhythm.  Tachycardic.  No murmurs.  LUNGS:   Clear bilaterally.  ABDOMEN:  Soft, slightly tender in the epigastrium.  EXTREMITIES:  No clubbing, cyanosis or edema.   LABORATORY DATA:  Blood work:  PH 6.85, PCO2 6, PO2 145, bicarb 0.9.  White  count 21.5, hemoglobin 16.8, hematocrit 48.3, MCV 90.6, platelets 357.  Sodium 130, potassium 4.5, chloride 103, bicarb 5, glucose 614, BUN 8,  creatinine 1.9, calcium 8.  Urinalysis is showing ketones, small amount of  blood, many bacteria and 0-2 wbc's and some granular casts.  Chest x-ray  shows no active lung disease.   ASSESSMENT/PLAN:  This is an 25 year old African American male admitted with  severe diabetic ketoacidosis.  He is very acidotic and continues to have  nausea and vomiting.  He will be started on an insulin drip.  He has been  receiving fluid boluses in the ER which will be continued.  Thereafter, he  will be  started on normal saline 250 mL per hour after he has received at  least 3 L of normal saline boluses.  In addition, he will be started on  Reglan for nausea and vomiting.  A urine culture will be done.  Also a GC  and Chlamydia probe will be done on his urine.  Initially I am going to  place him on Tequin 500 mg IV daily and will follow him closely.       SR/MEDQ  D:  02/23/2005  T:  02/23/2005  Job:  [id number]

## 2011-03-06 ENCOUNTER — Emergency Department (HOSPITAL_COMMUNITY)
Admission: EM | Admit: 2011-03-06 | Discharge: 2011-03-06 | Disposition: A | Discharge status: Home / Self Care | Payer: Medicare Other | Attending: Emergency Medicine | Admitting: Emergency Medicine

## 2011-03-06 DIAGNOSIS — K5289 Other specified noninfective gastroenteritis and colitis: Secondary | ICD-10-CM | POA: Insufficient documentation

## 2011-03-06 DIAGNOSIS — E119 Type 2 diabetes mellitus without complications: Secondary | ICD-10-CM | POA: Insufficient documentation

## 2011-03-06 DIAGNOSIS — R000 Tachycardia, unspecified: Secondary | ICD-10-CM | POA: Insufficient documentation

## 2011-03-06 DIAGNOSIS — R319 Hematuria, unspecified: Secondary | ICD-10-CM | POA: Insufficient documentation

## 2011-03-06 LAB — CBC
MCH: 30.9 pg (ref 26.0–34.0)
Platelets: 273 10*3/uL (ref 150–400)
RBC: 4.56 MIL/uL (ref 4.22–5.81)
WBC: 12.2 10*3/uL — ABNORMAL HIGH (ref 4.0–10.5)

## 2011-03-06 LAB — URINALYSIS, ROUTINE W REFLEX MICROSCOPIC
Ketones, ur: >80 mg/dL — AB
Leukocytes, UA: NEGATIVE
Nitrite: NEGATIVE
pH: 6.5 (ref 5.0–8.0)

## 2011-03-06 LAB — GLUCOSE, CAPILLARY: Glucose-Capillary: 282 mg/dL — ABNORMAL HIGH (ref 70–99)

## 2011-03-06 LAB — DIFFERENTIAL
Basophils Relative: 0 % (ref 0–1)
Eosinophils Absolute: 0 10*3/uL (ref 0.0–0.7)
Monocytes Relative: 3 % (ref 3–12)
Neutrophils Relative %: 87 % — ABNORMAL HIGH (ref 43–77)

## 2011-03-06 LAB — BASIC METABOLIC PANEL
BUN: 12 mg/dL (ref 6–23)
CO2: 30 mEq/L (ref 19–32)
Calcium: 9.9 mg/dL (ref 8.4–10.5)
Creatinine, Ser: 0.72 mg/dL (ref 0.50–1.35)
Glucose, Bld: 235 mg/dL — ABNORMAL HIGH (ref 70–99)

## 2011-03-06 LAB — URINE MICROSCOPIC-ADD ON

## 2011-06-18 LAB — DIFFERENTIAL
Basophils Absolute: 0
Basophils Relative: 0
Lymphocytes Relative: 8 — ABNORMAL LOW
Monocytes Absolute: 0.4
Monocytes Relative: 3
Neutro Abs: 10.8 — ABNORMAL HIGH
Neutrophils Relative %: 89 — ABNORMAL HIGH

## 2011-06-18 LAB — BASIC METABOLIC PANEL
BUN: 11
BUN: 12
CO2: 18 — ABNORMAL LOW
Calcium: 10.4
Calcium: 8.8
Creatinine, Ser: 0.88
Creatinine, Ser: 1.22
GFR calc non Af Amer: >60
Glucose, Bld: 368 — ABNORMAL HIGH
Glucose, Bld: 56 — ABNORMAL LOW

## 2011-06-18 LAB — URINALYSIS, ROUTINE W REFLEX MICROSCOPIC
Hgb urine dipstick: NEGATIVE
Nitrite: NEGATIVE
Specific Gravity, Urine: >1.03 — ABNORMAL HIGH
Urobilinogen, UA: 0.2
pH: 5.5

## 2011-06-18 LAB — BLOOD GAS, ARTERIAL
Bicarbonate: 17.1 — ABNORMAL LOW
FIO2: 0.21
O2 Saturation: 94.5
Patient temperature: 37
pH, Arterial: 7.378

## 2011-06-18 LAB — CBC
Hemoglobin: 16.4
RBC: 5.33

## 2011-06-18 LAB — URINE MICROSCOPIC-ADD ON: Urine-Other: NONE SEEN

## 2011-06-24 LAB — COMPREHENSIVE METABOLIC PANEL
Alkaline Phosphatase: 71 U/L (ref 39–117)
BUN: 10 mg/dL (ref 6–23)
Creatinine, Ser: 0.69 mg/dL (ref 0.4–1.5)
Glucose, Bld: 240 mg/dL — ABNORMAL HIGH (ref 70–99)
Potassium: 3.3 mEq/L — ABNORMAL LOW (ref 3.5–5.1)
Total Protein: 6.5 g/dL (ref 6.0–8.3)

## 2011-06-24 LAB — BLOOD GAS, ARTERIAL
Bicarbonate: 27.4 mEq/L — ABNORMAL HIGH (ref 20.0–24.0)
FIO2: 0.21 %
Patient temperature: 37
pCO2 arterial: 45.1 mmHg — ABNORMAL HIGH (ref 35.0–45.0)
pH, Arterial: 7.4 (ref 7.350–7.450)

## 2011-06-24 LAB — DIFFERENTIAL
Basophils Absolute: 0 10*3/uL (ref 0.0–0.1)
Basophils Relative: 0 % (ref 0–1)
Lymphocytes Relative: 7 % — ABNORMAL LOW (ref 12–46)
Monocytes Relative: 3 % (ref 3–12)
Neutro Abs: 9.4 10*3/uL — ABNORMAL HIGH (ref 1.7–7.7)
Neutrophils Relative %: 90 % — ABNORMAL HIGH (ref 43–77)

## 2011-06-24 LAB — CBC
HCT: 41.6 % (ref 39.0–52.0)
Hemoglobin: 14 g/dL (ref 13.0–17.0)
MCHC: 33.6 g/dL (ref 30.0–36.0)
MCV: 92.3 fL (ref 78.0–100.0)
Platelets: 239 10*3/uL (ref 150–400)
RDW: 13.6 % (ref 11.5–15.5)

## 2011-06-24 LAB — GLUCOSE, CAPILLARY: Glucose-Capillary: 250 mg/dL — ABNORMAL HIGH (ref 70–99)

## 2011-07-27 ENCOUNTER — Other Ambulatory Visit: Payer: Self-pay

## 2011-07-27 ENCOUNTER — Encounter (HOSPITAL_COMMUNITY): Payer: Self-pay | Admitting: Emergency Medicine

## 2011-07-27 ENCOUNTER — Inpatient Hospital Stay (HOSPITAL_COMMUNITY)
Admission: EM | Admit: 2011-07-27 | Discharge: 2011-07-29 | DRG: 639 | Disposition: A | Discharge status: Home / Self Care | Payer: Medicare Other | Attending: Internal Medicine | Admitting: Internal Medicine

## 2011-07-27 DIAGNOSIS — E1110 Type 2 diabetes mellitus with ketoacidosis without coma: Secondary | ICD-10-CM

## 2011-07-27 DIAGNOSIS — E876 Hypokalemia: Secondary | ICD-10-CM | POA: Diagnosis not present

## 2011-07-27 DIAGNOSIS — Z91199 Patient's noncompliance with other medical treatment and regimen due to unspecified reason: Secondary | ICD-10-CM

## 2011-07-27 DIAGNOSIS — Z794 Long term (current) use of insulin: Secondary | ICD-10-CM

## 2011-07-27 DIAGNOSIS — Z9119 Patient's noncompliance with other medical treatment and regimen: Secondary | ICD-10-CM

## 2011-07-27 DIAGNOSIS — E860 Dehydration: Secondary | ICD-10-CM | POA: Diagnosis present

## 2011-07-27 DIAGNOSIS — Z720 Tobacco use: Secondary | ICD-10-CM

## 2011-07-27 DIAGNOSIS — R1110 Vomiting, unspecified: Secondary | ICD-10-CM | POA: Diagnosis present

## 2011-07-27 DIAGNOSIS — D72829 Elevated white blood cell count, unspecified: Secondary | ICD-10-CM | POA: Diagnosis present

## 2011-07-27 DIAGNOSIS — F1210 Cannabis abuse, uncomplicated: Secondary | ICD-10-CM | POA: Diagnosis present

## 2011-07-27 DIAGNOSIS — K5289 Other specified noninfective gastroenteritis and colitis: Secondary | ICD-10-CM | POA: Diagnosis present

## 2011-07-27 DIAGNOSIS — E1010 Type 1 diabetes mellitus with ketoacidosis without coma: Secondary | ICD-10-CM | POA: Diagnosis present

## 2011-07-27 DIAGNOSIS — F17200 Nicotine dependence, unspecified, uncomplicated: Secondary | ICD-10-CM | POA: Diagnosis present

## 2011-07-27 HISTORY — DX: Tobacco use: Z72.0

## 2011-07-27 HISTORY — DX: Cannabis abuse, uncomplicated: F12.10

## 2011-07-27 LAB — COMPREHENSIVE METABOLIC PANEL
AST: 19 U/L (ref 0–37)
Albumin: 4.8 g/dL (ref 3.5–5.2)
Calcium: 11.1 mg/dL — ABNORMAL HIGH (ref 8.4–10.5)
Creatinine, Ser: 0.84 mg/dL (ref 0.50–1.35)

## 2011-07-27 LAB — BLOOD GAS, ARTERIAL
Acid-base deficit: 4.4 mmol/L — ABNORMAL HIGH (ref 0.0–2.0)
FIO2: 21 %
O2 Saturation: 97.3 %
TCO2: 17.7 mmol/L (ref 0–100)
pCO2 arterial: 38 mmHg (ref 35.0–45.0)

## 2011-07-27 LAB — GLUCOSE, CAPILLARY
Glucose-Capillary: 452 mg/dL — ABNORMAL HIGH (ref 70–99)
Glucose-Capillary: 347 mg/dL — ABNORMAL HIGH (ref 70–99)
Glucose-Capillary: 310 mg/dL — ABNORMAL HIGH (ref 70–99)
Glucose-Capillary: 183 mg/dL — ABNORMAL HIGH (ref 70–99)

## 2011-07-27 LAB — MRSA PCR SCREENING: MRSA by PCR: NEGATIVE

## 2011-07-27 LAB — DIFFERENTIAL
Eosinophils Absolute: 0 10*3/uL (ref 0.0–0.7)
Eosinophils Relative: 0 % (ref 0–5)
Lymphs Abs: 1.7 10*3/uL (ref 0.7–4.0)
Monocytes Absolute: 0.5 10*3/uL (ref 0.1–1.0)
Monocytes Relative: 2 % — ABNORMAL LOW (ref 3–12)

## 2011-07-27 LAB — CBC
HCT: 50.2 % (ref 39.0–52.0)
Hemoglobin: 17.5 g/dL — ABNORMAL HIGH (ref 13.0–17.0)
MCH: 31.4 pg (ref 26.0–34.0)
MCV: 90 fL (ref 78.0–100.0)
RBC: 5.58 MIL/uL (ref 4.22–5.81)

## 2011-07-27 LAB — URINE MICROSCOPIC-ADD ON

## 2011-07-27 LAB — URINALYSIS, ROUTINE W REFLEX MICROSCOPIC
Ketones, ur: >80 mg/dL — AB
Leukocytes, UA: NEGATIVE
Nitrite: NEGATIVE
Protein, ur: 100 mg/dL — AB
Urobilinogen, UA: 0.2 mg/dL (ref 0.0–1.0)

## 2011-07-27 MED ORDER — SODIUM CHLORIDE 0.9 % IV SOLN
INTRAVENOUS | Status: DC
  Administered 2011-07-27: 20:00:00 via INTRAVENOUS
  Filled 2011-07-27: qty 1

## 2011-07-27 MED ORDER — INSULIN REGULAR HUMAN 100 UNIT/ML IJ SOLN
INTRAMUSCULAR | Status: AC
  Filled 2011-07-27: qty 3

## 2011-07-27 MED ORDER — DEXTROSE-NACL 5-0.45 % IV SOLN
INTRAVENOUS | Status: DC
  Administered 2011-07-27 – 2011-07-28 (×2): via INTRAVENOUS

## 2011-07-27 MED ORDER — ONDANSETRON HCL 4 MG/2ML IJ SOLN
4.0000 mg | Freq: Four times a day (QID) | INTRAMUSCULAR | Status: DC | PRN
  Administered 2011-07-27 – 2011-07-28 (×2): 4 mg via INTRAVENOUS
  Filled 2011-07-27 (×2): qty 2

## 2011-07-27 MED ORDER — NICOTINE 7 MG/24HR TD PT24
7.0000 mg | MEDICATED_PATCH | Freq: Every day | TRANSDERMAL | Status: DC
  Administered 2011-07-27 – 2011-07-29 (×3): 7 mg via TRANSDERMAL
  Filled 2011-07-27 (×4): qty 1

## 2011-07-27 MED ORDER — TRAZODONE HCL 50 MG PO TABS
25.0000 mg | ORAL_TABLET | Freq: Every evening | ORAL | Status: DC | PRN
  Administered 2011-07-27: 25 mg via ORAL
  Filled 2011-07-27: qty 1

## 2011-07-27 MED ORDER — SODIUM CHLORIDE 0.9 % IV SOLN: INTRAVENOUS | Status: DC

## 2011-07-27 MED ORDER — ACETAMINOPHEN 650 MG RE SUPP: 650.0000 mg | Freq: Four times a day (QID) | RECTAL | Status: DC | PRN

## 2011-07-27 MED ORDER — DEXTROSE 50 % IV SOLN
25.0000 mL | INTRAVENOUS | Status: DC | PRN
  Administered 2011-07-28: 25 mL via INTRAVENOUS

## 2011-07-27 MED ORDER — INSULIN GLARGINE 100 UNIT/ML ~~LOC~~ SOLN
80.0000 [IU] | Freq: Two times a day (BID) | SUBCUTANEOUS | Status: DC
  Administered 2011-07-27: 80 [IU] via SUBCUTANEOUS
  Filled 2011-07-27: qty 3

## 2011-07-27 MED ORDER — SODIUM CHLORIDE 0.9 % IV BOLUS (SEPSIS)
1000.0000 mL | Freq: Once | INTRAVENOUS | Status: AC
  Administered 2011-07-27: 1000 mL via INTRAVENOUS

## 2011-07-27 MED ORDER — ACETAMINOPHEN 325 MG PO TABS: 650.0000 mg | ORAL_TABLET | Freq: Four times a day (QID) | ORAL | Status: DC | PRN

## 2011-07-27 MED ORDER — ONDANSETRON HCL 4 MG PO TABS: 4.0000 mg | ORAL_TABLET | Freq: Four times a day (QID) | ORAL | Status: DC | PRN

## 2011-07-27 MED ORDER — ENOXAPARIN SODIUM 40 MG/0.4ML ~~LOC~~ SOLN
40.0000 mg | SUBCUTANEOUS | Status: DC
  Administered 2011-07-27 – 2011-07-28 (×2): 40 mg via SUBCUTANEOUS
  Filled 2011-07-27 (×2): qty 0.4

## 2011-07-27 MED ORDER — SODIUM CHLORIDE 0.9 % IV SOLN
INTRAVENOUS | Status: DC
  Administered 2011-07-27: 21:00:00 via INTRAVENOUS

## 2011-07-27 NOTE — ED Notes (Signed)
Restingin bed on back with eyes open and lights on. Watching tv. Denies pain. Denies any needs. Call bell within reach.

## 2011-07-27 NOTE — ED Notes (Signed)
Report given to kim rowland, rn. Asked to wait 5 minutes for transfer.

## 2011-07-27 NOTE — ED Notes (Signed)
MD campbell at bedside with patient at this time.

## 2011-07-27 NOTE — ED Notes (Signed)
Into room to see patient. Resting in bed on back. Would like some sugar free jello and\ or diet ginger ale. Denies any other needs. Denies pain. In no distress. Call bell within reach. Will continue to monitor.

## 2011-07-27 NOTE — H&P (Signed)
PCP:   Tammi Sou, MD, MD   Chief Complaint:  Vomiting since this morning  HPI: Nathaniel [name] is an 25 y.o. male.   Diabetes type 1 with multiple admissions for DKA, maintained on twice-daily Lantus with sliding scale NovoLog, reports that he missed last nights dose of Lantus because he got home late, has been having abdominal pain vomiting and loose stools since this morning. Patient arrived to the emergency room about 1 PM today, and prior to arrival had 4 loose stools, and about 7 episodes of vomiting of bilious material. Patient has been in the emergency room over 5 hours and has had only one episode of vomiting and no episode of loose stool. He is complaining of being hungry. His lab work shows evidence of metabolic derangement and he appears close tol being in DKA but is not yet severely acidotic.  He denies fever cough or cold he denies chest pains shortness of breath frequency or dysuria; denies any bloody or black stool he denies any blood in his vomitus Here his reason for coming to the emergency room was that he recognizes vomiting as a sign of DKA and he decided to come in before it got worse.  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 He is trying to quit smoking he is down to 5 cigarettes per day; he smokes marijuana occasionally last use about 4 days ago.   Past Medical History  Diagnosis Date  . Diabetes mellitus     lantus/novolog  . Tobacco abuse     5/day  . Marijuana abuse     occaisionally    History reviewed. No pertinent past surgical history.  Medications:  HOME MEDS: Prior to Admission medications   Medication Sig Start Date End Date Taking? Authorizing Provider  insulin aspart (NOVOLOG) 100 UNIT/ML injection Inject 3-15 Units into the skin as directed. Per sliding scale instructions.    Yes Historical Provider, MD  insulin glargine (LANTUS) 100 UNIT/ML injection Inject 80  Units into the skin 2 (two) times daily.     Yes Historical Provider, MD    Allergies:  Allergies  Allergen Reactions  . Orange     Increases blood sugar     Social History:   reports that he has been smoking Cigarettes.  He has a 4 pack-year smoking history. He has never used smokeless tobacco. He reports that he uses illicit drugs (Marijuana). He reports that he does not drink alcohol.  Family History: Family History  Problem Relation Age of Onset  . Diabetes Father   . Hypertension Mother   . Kidney failure Father     last 4 mnths of life    Rewiew of Systems:  The patient denies anorexia, fever, weight loss,, vision loss, decreased hearing, hoarseness, chest pain, syncope, dyspnea on exertion, peripheral edema, balance deficits, hemoptysis,  melena, hematochezia, severe indigestion/heartburn, hematuria, incontinence, genital sores, muscle weakness, suspicious skin lesions, transient blindness, difficulty walking, depression, unusual weight change, abnormal bleeding, enlarged lymph nodes, angioedema, and breast masses.   Physical Exam: Filed Vitals:   07/27/11 1707 07/27/11 1800 07/27/11 1838 07/27/11 1916  BP:  149/89 135/82 121/87  Pulse: 108 106 109 110  Temp:    98.7 F (37.1 C)  TempSrc:    Oral  Resp: 18 16 18 20   Height:      Weight:      SpO2: 99% 100% 98% 99%  Blood pressure 121/87, pulse 110, temperature 98.7 F (37.1 C), temperature source Oral, resp. rate 20, height 6\' 1"  (1.854 m), weight 71.215 kg (157 lb), SpO2 99.00%.  GEN: Pleasant young African American gentleman lying in the stretcher, appears uncomfortable; cooperative with exam PSYCH: He is alert and oriented x4; does not appear anxious does not appear depressed; affect is normal HEENT: Mucous membranes pink and dry, and anicteric; PERRLA; EOM intact; no cervical lymphadenopathy nor thyromegaly or carotid bruit; no JVD; Breasts:: Not examined CHEST WALL: No tenderness CHEST: Normal respiration,  clear to auscultation bilaterally HEART:  tachycardic ,regular rate and rhythm;  apical systolic murmur, gallop rhythm.  BACK: No kyphosis or scoliosis; no CVA tenderness ABDOMEN: Scaphoid , soft non-tender; no masses, no organomegaly, normal abdominal bowel sounds;  Rectal Exam: Not done EXTREMITIES: No bone or joint deformity;  no edema; no ulcerations. Genitalia: not examined PULSES: 2+ and symmetric SKIN: Normal hydration no rash or ulceration CNS: Cranial nerves 2-12 grossly intact no focal neurologic deficit   Labs & Imaging Results for orders placed during the hospital encounter of 07/27/11 (from the past 48 hour(s))  GLUCOSE, CAPILLARY     Status: Abnormal   Collection Time   07/27/11  1:31 PM      Component Value Range Comment   Glucose-Capillary 452 (*) 70 - 99 (mg/dL)   GLUCOSE, CAPILLARY     Status: Abnormal   Collection Time   07/27/11  5:23 PM      Component Value Range Comment   Glucose-Capillary 393 (*) 70 - 99 (mg/dL)   COMPREHENSIVE METABOLIC PANEL     Status: Abnormal   Collection Time   07/27/11  5:25 PM      Component Value Range Comment   Sodium 136  135 - 145 (mEq/L)    Potassium 4.7  3.5 - 5.1 (mEq/L)    Chloride 89 (*) 96 - 112 (mEq/L)    CO2 25  19 - 32 (mEq/L)    Glucose, Bld 393 (*) 70 - 99 (mg/dL)    BUN 17  6 - 23 (mg/dL)    Creatinine, Ser 0.84  0.50 - 1.35 (mg/dL)    Calcium 11.1 (*) 8.4 - 10.5 (mg/dL)    Total Protein 9.5 (*) 6.0 - 8.3 (g/dL)    Albumin 4.8  3.5 - 5.2 (g/dL)    AST 19  0 - 37 (U/L)    ALT 21  0 - 53 (U/L)    Alkaline Phosphatase 131 (*) 39 - 117 (U/L)    Total Bilirubin 1.0  0.3 - 1.2 (mg/dL)    GFR calc non Af Amer >90  >90 (mL/min)    GFR calc Af Amer >90  >90 (mL/min)   CBC     Status: Abnormal   Collection Time   07/27/11  5:25 PM      Component Value Range Comment   WBC 22.6 (*) 4.0 - 10.5 (K/uL)    RBC 5.58  4.22 - 5.81 (MIL/uL)    Hemoglobin 17.5 (*) 13.0 - 17.0 (g/dL)    HCT 50.2  39.0 - 52.0 (%)    MCV 90.0   78.0 - 100.0 (fL)    MCH 31.4  26.0 - 34.0 (pg)    MCHC 34.9  30.0 - 36.0 (g/dL)    RDW 13.7  11.5 - 15.5 (%)    Platelets 319  150 - 400 (K/uL)   DIFFERENTIAL     Status: Abnormal   Collection Time   07/27/11  5:25 PM      Component Value Range Comment   Neutrophils Relative 90 (*) 43 - 77 (%)    Neutro Abs 20.4 (*) 1.7 - 7.7 (K/uL)    Lymphocytes Relative 7 (*) 12 - 46 (%)    Lymphs Abs 1.7  0.7 - 4.0 (K/uL)    Monocytes Relative 2 (*) 3 - 12 (%)    Monocytes Absolute 0.5  0.1 - 1.0 (K/uL)    Eosinophils Relative 0  0 - 5 (%)    Eosinophils Absolute 0.0  0.0 - 0.7 (K/uL)    Basophils Relative 0  0 - 1 (%)    Basophils Absolute 0.1  0.0 - 0.1 (K/uL)   GLUCOSE, CAPILLARY     Status: Abnormal   Collection Time   07/27/11  5:48 PM      Component Value Range Comment   Glucose-Capillary 347 (*) 70 - 99 (mg/dL)   URINALYSIS, ROUTINE W REFLEX MICROSCOPIC     Status: Abnormal   Collection Time   07/27/11  6:22 PM      Component Value Range Comment   Color, Urine YELLOW  YELLOW     Appearance CLEAR  CLEAR     Specific Gravity, Urine >1.030 (*) 1.005 - 1.030     pH 5.5  5.0 - 8.0     Glucose, UA >1000 (*) NEGATIVE (mg/dL)    Hgb urine dipstick MODERATE (*) NEGATIVE     Bilirubin Urine NEGATIVE  NEGATIVE     Ketones, ur >80 (*) NEGATIVE (mg/dL)    Protein, ur 100 (*) NEGATIVE (mg/dL)    Urobilinogen, UA 0.2  0.0 - 1.0 (mg/dL)    Nitrite NEGATIVE  NEGATIVE     Leukocytes, UA NEGATIVE  NEGATIVE    URINE MICROSCOPIC-ADD ON     Status: Normal   Collection Time   07/27/11  6:22 PM      Component Value Range Comment   Squamous Epithelial / LPF RARE  RARE     WBC, UA 0-2  <3 (WBC/hpf)    RBC / HPF 11-20  <3 (RBC/hpf)    Bacteria, UA RARE  RARE    BLOOD GAS, ARTERIAL     Status: Abnormal   Collection Time   07/27/11  7:20 PM      Component Value Range Comment   FIO2 21.00      Delivery systems ROOM AIR      pH, Arterial 7.346 (*) 7.350 - 7.450     pCO2 arterial 38.0  35.0 - 45.0  (mmHg)    pO2, Arterial 96.5  80.0 - 100.0 (mmHg)    Bicarbonate 20.3  20.0 - 24.0 (mEq/L)    TCO2 17.7  0 - 100 (mmol/L)    Acid-base deficit 4.4 (*) 0.0 - 2.0 (mmol/L)    O2 Saturation 97.3      Patient temperature 37.0      Collection site RIGHT RADIAL      Drawn by COLLECTED BY RT      Sample type [id number]      Allens test (pass/fail) PASS  PASS    GLUCOSE, CAPILLARY     Status: Abnormal   Collection Time   07/27/11  7:21 PM      Component Value Range Comment   Glucose-Capillary 310 (*) 70 - 99 (mg/dL)    No results found.    Assessment Present on Admission:  .DM (diabetes mellitus), type 1, uncontrolled  Vomiting .Dehydration Diabetic nephropathy   .Tobacco abuse .  Marijuana abuse   Discussion & PLAN: Because this gentleman has been vomiting and is hypochloremic, it is possible she has a mixed metabolic alkalosis and metabolic acidosis; but since he does not appear to be in frank DKA, we will give him his evening dose of Lantus while we continue his insulin drip to normalize his blood sugar and prevent the frank development of DKA, will check his chemistry every 4 hours.  Despite his marked leukocytosis he does not appear to have any indication of infection, and his leukocytosis hypercalcemia and hyperproteinemia are likely all just manifestations of his dehydration. We will reevaluate when he is more hydrated.  Will give him a trial of clear liquid diet but if he vomits again we'll switch him to nil by mouth.  We have counseled him on the detriment of tobacco abuse in the setting of diabetes, and the detriment of marijuana abuse while attempting educational advancement.  In view of his proteinuria. and family history of end-stage renal disease, we'll likely need to be started on an ACE inhibitor when his diet is confirmed..  Other plans as per orders.   Nathaniel [name] 07/27/2011, 8:26 PM

## 2011-07-27 NOTE — ED Notes (Signed)
cbg was 310

## 2011-07-27 NOTE — ED Provider Notes (Signed)
History  Scribed for Sharyon Cable, MD, the patient was seen in [facility]. The chart was scribed by Clarisa Fling. The patients care was started at 5:42 PM. CSN: [id number] Arrival date & time: 07/27/2011  4:45 PM   First MD Initiated Contact with Patient 07/27/11 1717      Chief Complaint  Patient presents with  . Emesis  . Fatigue     HPI Nathaniel [name] is a 25 y.o. male with a history of DM who presents to the Emergency Department complaining of emesis onset 5am. Pt states this is DKA. Associated symptoms of mild cough, diarrhea and abdominal pain. Pt reports he is taking insulin. Denies any fever. There are no other associated symptoms and no other alleviating or aggravating factors.  PCP: Dr. Anitra Lauth  Past Medical History  Diagnosis Date  . Diabetes mellitus     lantus/novolog    History reviewed. No pertinent past surgical history.  History reviewed. No pertinent family history.  History  Substance Use Topics  . Smoking status: Current Everyday Smoker -- 0.5 packs/day for 8 years    Types: Cigarettes  . Smokeless tobacco: Never Used  . Alcohol Use: No      Review of Systems  Constitutional: Positive for fatigue.  Respiratory: Positive for cough.   Gastrointestinal: Positive for nausea, vomiting and abdominal pain. Negative for diarrhea.  All other systems reviewed and are negative.    Allergies  Orange  Home Medications   Current Outpatient Rx  Name Route Sig Dispense Refill  . INSULIN ASPART 100 UNIT/ML [place] SOLN Subcutaneous Inject 3-15 Units into the skin as directed. Per sliding scale instructions.     . INSULIN GLARGINE 100 UNIT/ML [place] SOLN Subcutaneous Inject 80 Units into the skin 2 (two) times daily.        BP 167/90  Pulse 108  Temp(Src) 97.4 F (36.3 C) (Oral)  Resp 18  Ht 6\' 1"  (1.854 m)  Wt 157 lb (71.215 kg)  BMI 20.71 kg/m2  SpO2 99%  BP 135/82  Pulse 109  Temp(Src) 97.4 F (36.3 C) (Oral)  Resp 18  Ht 6\' 1"  (1.854 m)   Wt 157 lb (71.215 kg)  BMI 20.71 kg/m2  SpO2 98%   Physical Exam CONSTITUTIONAL: Well developed/well nourished, pt smells of ketones HEAD AND FACE: Normocephalic/atraumatic EYES: EOMI/PERRL ENMT: Mucous membranes moist NECK: supple no meningeal dry SPINE:entire spine nontender CV: S1/S2 noted, no murmurs/rubs/gallops noted LUNGS: Lungs are clear to auscultation bilaterally, no apparent distress ABDOMEN: soft, nontender, no rebound or guarding GU:no cva tenderness NEURO: Pt is awake/alert, moves all extremitiesx4 EXTREMITIES: pulses normal, full ROM SKIN: warm, color normal PSYCH: no abnormalities of mood noted    ED Course  Procedures   CRITICAL CARE Performed by: Sharyon Cable   Total critical care time: 40  Critical care time was exclusive of separately billable procedures and treating other patients.  Critical care was necessary to treat or prevent imminent or life-threatening deterioration.  Critical care was time spent personally by me on the following activities: development of treatment plan with patient and/or surrogate as well as nursing, discussions with consultants, evaluation of patient's response to treatment, examination of patient, obtaining history from patient or surrogate, ordering and performing treatments and interventions, ordering and review of laboratory studies, ordering and review of radiographic studies, pulse oximetry and re-evaluation of patient's condition.    DIAGNOSTIC STUDIES: Oxygen Saturation is 100% on room air, normal by my interpretation.    COORDINATION OF CARE: 5:42PM:  -  Patient evaluated by ED physician, labs, POCT, and EKG ordered 6:50 PM pt stabilized, feels improved, insulin ordered 7:12 PM d/w dr Megan Salon, will admit ICU.   Results for orders placed during the hospital encounter of 07/27/11  GLUCOSE, CAPILLARY      Component Value Range   Glucose-Capillary 452 (*) 70 - 99 (mg/dL)  COMPREHENSIVE METABOLIC PANEL       Component Value Range   Sodium 136  135 - 145 (mEq/L)   Potassium 4.7  3.5 - 5.1 (mEq/L)   Chloride 89 (*) 96 - 112 (mEq/L)   CO2 25  19 - 32 (mEq/L)   Glucose, Bld 393 (*) 70 - 99 (mg/dL)   BUN 17  6 - 23 (mg/dL)   Creatinine, Ser 0.84  0.50 - 1.35 (mg/dL)   Calcium 11.1 (*) 8.4 - 10.5 (mg/dL)   Total Protein 9.5 (*) 6.0 - 8.3 (g/dL)   Albumin 4.8  3.5 - 5.2 (g/dL)   AST 19  0 - 37 (U/L)   ALT 21  0 - 53 (U/L)   Alkaline Phosphatase 131 (*) 39 - 117 (U/L)   Total Bilirubin 1.0  0.3 - 1.2 (mg/dL)   GFR calc non Af Amer >90  >90 (mL/min)   GFR calc Af Amer >90  >90 (mL/min)  CBC      Component Value Range   WBC 22.6 (*) 4.0 - 10.5 (K/uL)   RBC 5.58  4.22 - 5.81 (MIL/uL)   Hemoglobin 17.5 (*) 13.0 - 17.0 (g/dL)   HCT 50.2  39.0 - 52.0 (%)   MCV 90.0  78.0 - 100.0 (fL)   MCH 31.4  26.0 - 34.0 (pg)   MCHC 34.9  30.0 - 36.0 (g/dL)   RDW 13.7  11.5 - 15.5 (%)   Platelets 319  150 - 400 (K/uL)  DIFFERENTIAL      Component Value Range   Neutrophils Relative 90 (*) 43 - 77 (%)   Neutro Abs 20.4 (*) 1.7 - 7.7 (K/uL)   Lymphocytes Relative 7 (*) 12 - 46 (%)   Lymphs Abs 1.7  0.7 - 4.0 (K/uL)   Monocytes Relative 2 (*) 3 - 12 (%)   Monocytes Absolute 0.5  0.1 - 1.0 (K/uL)   Eosinophils Relative 0  0 - 5 (%)   Eosinophils Absolute 0.0  0.0 - 0.7 (K/uL)   Basophils Relative 0  0 - 1 (%)   Basophils Absolute 0.1  0.0 - 0.1 (K/uL)  GLUCOSE, CAPILLARY      Component Value Range   Glucose-Capillary 393 (*) 70 - 99 (mg/dL)  GLUCOSE, CAPILLARY      Component Value Range   Glucose-Capillary 347 (*) 70 - 99 (mg/dL)     MDM  Nursing notes reviewed and considered in documentation All labs/vitals reviewed and considered Previous records reviewed and considered   Pt with h/o DM, smells ketotic, has hyperglycemia, dehydration and has anion gap.  IV insulin started.     Date: 07/27/2011  Rate: 102  Rhythm: sinus tachycardia  QRS Axis: normal  Intervals: normal  ST/T Wave  abnormalities: early repolarization  Conduction Disutrbances:none  Narrative Interpretation:   Old EKG Reviewed: unchanged     I personally performed the services described in this documentation, which was scribed in my presence. The recorded information has been reviewed and considered.           Sharyon Cable, MD 07/27/11 [phone]

## 2011-07-27 NOTE — ED Notes (Signed)
Patient c/o nausea, vomiting, and fatigue. Patient also reports having mid abd pain.

## 2011-07-28 ENCOUNTER — Inpatient Hospital Stay (HOSPITAL_COMMUNITY): Payer: Medicare Other

## 2011-07-28 DIAGNOSIS — E876 Hypokalemia: Secondary | ICD-10-CM | POA: Diagnosis not present

## 2011-07-28 DIAGNOSIS — R1110 Vomiting, unspecified: Secondary | ICD-10-CM | POA: Diagnosis present

## 2011-07-28 DIAGNOSIS — D72829 Elevated white blood cell count, unspecified: Secondary | ICD-10-CM | POA: Diagnosis present

## 2011-07-28 LAB — CBC
HCT: 41 % (ref 39.0–52.0)
Hemoglobin: 14.2 g/dL (ref 13.0–17.0)
MCH: 30.5 pg (ref 26.0–34.0)
MCV: 88 fL (ref 78.0–100.0)
RBC: 4.66 MIL/uL (ref 4.22–5.81)

## 2011-07-28 LAB — GLUCOSE, CAPILLARY
Glucose-Capillary: 76 mg/dL (ref 70–99)
Glucose-Capillary: 65 mg/dL — ABNORMAL LOW (ref 70–99)
Glucose-Capillary: 64 mg/dL — ABNORMAL LOW (ref 70–99)
Glucose-Capillary: 114 mg/dL — ABNORMAL HIGH (ref 70–99)
Glucose-Capillary: 49 mg/dL — ABNORMAL LOW (ref 70–99)
Glucose-Capillary: 67 mg/dL — ABNORMAL LOW (ref 70–99)
Glucose-Capillary: 149 mg/dL — ABNORMAL HIGH (ref 70–99)

## 2011-07-28 LAB — BASIC METABOLIC PANEL
BUN: 12 mg/dL (ref 6–23)
CO2: 23 mEq/L (ref 19–32)
CO2: 28 mEq/L (ref 19–32)
CO2: 27 mEq/L (ref 19–32)
Calcium: 9.6 mg/dL (ref 8.4–10.5)
Chloride: 99 mEq/L (ref 96–112)
Chloride: 101 mEq/L (ref 96–112)
Chloride: 100 mEq/L (ref 96–112)
GFR calc Af Amer: >90 mL/min (ref >90)
GFR calc Af Amer: >90 mL/min (ref >90)
Glucose, Bld: 59 mg/dL — ABNORMAL LOW (ref 70–99)
Potassium: 3.3 mEq/L — ABNORMAL LOW (ref 3.5–5.1)
Potassium: 3.2 mEq/L — ABNORMAL LOW (ref 3.5–5.1)
Sodium: 136 mEq/L (ref 135–145)
Sodium: 137 mEq/L (ref 135–145)

## 2011-07-28 LAB — PHOSPHORUS: Phosphorus: 3.3 mg/dL (ref 2.3–4.6)

## 2011-07-28 LAB — LIPASE, BLOOD: Lipase: 14 U/L (ref 11–59)

## 2011-07-28 LAB — CARDIAC PANEL(CRET KIN+CKTOT+MB+TROPI)
CK, MB: 4.1 ng/mL — ABNORMAL HIGH (ref 0.3–4.0)
Troponin I: <0.3 ng/mL (ref <0.30)
Troponin I: <0.3 ng/mL (ref <0.30)

## 2011-07-28 LAB — MAGNESIUM: Magnesium: 2 mg/dL (ref 1.5–2.5)

## 2011-07-28 MED ORDER — INSULIN GLARGINE 100 UNIT/ML ~~LOC~~ SOLN
40.0000 [IU] | Freq: Once | SUBCUTANEOUS | Status: AC
  Administered 2011-07-28: 40 [IU] via SUBCUTANEOUS

## 2011-07-28 MED ORDER — PANTOPRAZOLE SODIUM 40 MG IV SOLR
40.0000 mg | INTRAVENOUS | Status: DC
  Administered 2011-07-28 – 2011-07-29 (×2): 40 mg via INTRAVENOUS
  Filled 2011-07-28 (×2): qty 40

## 2011-07-28 MED ORDER — POTASSIUM CHLORIDE IN NACL 40-0.9 MEQ/L-% IV SOLN
INTRAVENOUS | Status: DC
  Administered 2011-07-28 – 2011-07-29 (×3): via INTRAVENOUS
  Filled 2011-07-28 (×6): qty 1000

## 2011-07-28 MED ORDER — POTASSIUM CHLORIDE IN NACL 40-0.9 MEQ/L-% IV SOLN
INTRAVENOUS | Status: AC
  Filled 2011-07-28: qty 1000

## 2011-07-28 MED ORDER — DEXTROSE 50 % IV SOLN
INTRAVENOUS | Status: AC
  Filled 2011-07-28: qty 50

## 2011-07-28 MED ORDER — INSULIN ASPART 100 UNIT/ML ~~LOC~~ SOLN
0.0000 [IU] | Freq: Three times a day (TID) | SUBCUTANEOUS | Status: DC
Start: 2011-07-28 — End: 2011-07-29
  Filled 2011-07-28: qty 3

## 2011-07-28 MED ORDER — INSULIN GLARGINE 100 UNIT/ML ~~LOC~~ SOLN
40.0000 [IU] | Freq: Two times a day (BID) | SUBCUTANEOUS | Status: DC
  Administered 2011-07-29: 40 [IU] via SUBCUTANEOUS

## 2011-07-28 NOTE — Progress Notes (Signed)
CBG = 65.  Offered pt drink to improve blood glucose; pt requested apple juice.  118ml apple juice given to pt.

## 2011-07-28 NOTE — Progress Notes (Signed)
Spoke with patient concerning his Diabetes care at home.  Patient stated that he routinely sees Dr. Dwyane Dee for his diabetes management.  He also stated that his glucose has been running fine until yesterday when he became sick.  Patient's A1C is pending and will assess to determine glycemic control at home.  Spoke with patient's attending MD and MD will adjust Lantus dose and add Novolog Correction.  Agree with orders and will continue to follow patient.

## 2011-07-28 NOTE — Progress Notes (Signed)
Subjective: Feels hot. Still vomiting. Denies cough, shortness of breath, bleeding. Denies diarrhea. Denies sick contacts.  Objective: Vital signs in last 24 hours: Filed Vitals:   07/28/11 0500 07/28/11 0600 07/28/11 0700 07/28/11 0800  BP: 88/39 102/54 118/74 119/68  Pulse: 101 93 95 93  Temp:    97.9 F (36.6 C)  TempSrc:    Oral  Resp:  24  16  Height:      Weight:      SpO2: 99% 99% 100% 98%   Weight change:   Intake/Output Summary (Last 24 hours) at 07/28/11 0934 Last data filed at 07/28/11 0800  Gross per 24 hour  Intake 1706.25 ml  Output    300 ml  Net 1406.25 ml   Physical Exam: General: Uncomfortable appearing HEENT: No thrush. Moist mucous membranes Lungs clear to auscultation bilaterally without wheezes rhonchi or rales Cardiovascular regular rate rhythm without murmurs gallops rubs Abdomen soft nontender nondistended Extremities no clubbing cyanosis or edema  Lab Results: Basic Metabolic Panel:  Lab [id number] 07/27/11 2326  NA 137 136  K 3.3* 4.0  CL 101 99  CO2 28 23  GLUCOSE 59* 152*  BUN 14 14  CREATININE 0.80 0.77  CALCIUM 9.5 9.6  MG -- 1.7  PHOS -- 2.9   Liver Function Tests:  Lab 07/27/11 1725  AST 19  ALT 21  ALKPHOS 131*  BILITOT 1.0  PROT 9.5*  ALBUMIN 4.8   No results found for this basename: LIPASE:2,AMYLASE:2 in the last 168 hours No results found for this basename: AMMONIA:2 in the last 168 hours CBC:  Lab 07/28/11 0402 07/27/11 1725  WBC 15.9* 22.6*  NEUTROABS -- 20.4*  HGB 14.2 17.5*  HCT 41.0 50.2  MCV 88.0 90.0  PLT 280 319   Cardiac Enzymes:  Lab 07/28/11 0817 07/27/11 2326  CKTOTAL PENDING 178  CKMB 4.1* 3.7  CKMBINDEX -- --  TROPONINI <0.30 <0.30   BNP: No results found for this basename: POCBNP:3 in the last 168 hours D-Dimer: No results found for this basename: DDIMER:2 in the last 168 hours CBG:  Lab 07/28/11 0815 07/28/11 0703 07/28/11 0556 07/28/11 0451 07/28/11 0425 07/28/11 0413  GLUCAP  69* 114* 145* 182* 64* 61*   Hemoglobin A1C: No results found for this basename: HGBA1C in the last 168 hours Fasting Lipid Panel: No results found for this basename: CHOL,HDL,LDLCALC,TRIG,CHOLHDL,LDLDIRECT in the last 168 hours Thyroid Function Tests: No results found for this basename: TSH,T4TOTAL,FREET4,T3FREE,THYROIDAB in the last 168 hours Coagulation: No results found for this basename: LABPROT:4,INR:4 in the last 168 hours Anemia Panel: No results found for this basename: VITAMINB12,FOLATE,FERRITIN,TIBC,IRON,RETICCTPCT in the last 168 hours   Micro Results: Recent Results (from the past 240 hour(s))  MRSA PCR SCREENING     Status: Normal   Collection Time   07/27/11  8:21 PM      Component Value Range Status Comment   MRSA by PCR NEGATIVE  NEGATIVE  Final    Studies/Results: No results found.  Scheduled Meds:   . dextrose      . enoxaparin  40 mg Subcutaneous Q24H  . insulin glargine  40 Units Subcutaneous BID  . insulin glargine  40 Units Subcutaneous Once  . nicotine  7 mg Transdermal Daily  . pantoprazole (PROTONIX) IV  40 mg Intravenous Q24H  . sodium chloride  1,000 mL Intravenous Once  . sodium chloride  1,000 mL Intravenous Once  . DISCONTD: sodium chloride   Intravenous STAT  . DISCONTD: insulin glargine  80 Units Subcutaneous BID   Continuous Infusions:   . 0.9 % NaCl with KCl 40 mEq / L    . DISCONTD: sodium chloride    . DISCONTD: sodium chloride Stopped (07/27/11 2333)  . DISCONTD: dextrose 5 % and 0.45% NaCl 125 mL/hr at 07/28/11 0800  . DISCONTD: insulin (NOVOLIN-R) infusion Stopped (07/28/11 0145)   PRN Meds:.acetaminophen, acetaminophen, dextrose, ondansetron (ZOFRAN) IV, ondansetron, traZODone Assessment/Plan: Principal Problem:  *DKA, type 1 Active Problems:  Vomiting  Dehydration  Tobacco abuse  Marijuana abuse  Leukocytosis  Hypokalemia  The patient did have DKA on arrival. His anion gap was 24. His anion gap this morning is 80 and  he is out of DKA. He got a dose of 80 units of Lantus last night prior to discontinuation of the IV insulin. His blood sugar is low today. I will change his IV fluids to normal saline with potassium and skip his morning dose of Lantus. Then I will resume Lantus tonight at half dose until he is able to tolerate anything by mouth. He will get sliding scale. Hemoglobin A1c is pending. Transferred to the floor.  In the setting of vomiting and leukocytosis, I will check a lipase to rule out pancreatitis, and a chest x-ray to rule out pneumonia. The patient also reports feeling "hot". No antibiotics for now.  Replete potassium.   LOS: 1 day   Geovanni Rahming L 07/28/2011, 9:34 AM

## 2011-07-28 NOTE — Progress Notes (Signed)
Inpatient Diabetes Program Recommendations  AACE/ADA: New Consensus Statement on Inpatient Glycemic Control (2009)  Target Ranges:  Prepandial:   less than 140 mg/dL      Peak postprandial:   less than 180 mg/dL (1-2 hours)      Critically ill patients:  140 - 180 mg/dL   Reason for Visit: Uncontrolled Type 1 Diabetes  Inpatient Diabetes Program Recommendations Correction (SSI): Add Novolog Sensitive Correction q4 hours while NPO  Note: Will speak with patient regarding care of his Diabetes.  Will monitor pending A1c to assess glycemic control prior to admission.  ?  Need for different medication regimen which includes meal coverage rather than only SSI and basal insulin.

## 2011-07-28 NOTE — Progress Notes (Signed)
Report called to [name], LPN. Pt ready for transfer to department 300.  Resting quietly at present.  No acute distress noted.

## 2011-07-29 LAB — BASIC METABOLIC PANEL
BUN: 6 mg/dL (ref 6–23)
Chloride: 104 mEq/L (ref 96–112)
Creatinine, Ser: 0.71 mg/dL (ref 0.50–1.35)
GFR calc Af Amer: >90 mL/min (ref >90)
GFR calc non Af Amer: >90 mL/min (ref >90)
Potassium: 3.6 mEq/L (ref 3.5–5.1)

## 2011-07-29 LAB — CBC
HCT: 39.9 % (ref 39.0–52.0)
Hemoglobin: 13.8 g/dL (ref 13.0–17.0)
MCHC: 34.6 g/dL (ref 30.0–36.0)
RDW: 13.8 % (ref 11.5–15.5)
WBC: 11.9 10*3/uL — ABNORMAL HIGH (ref 4.0–10.5)

## 2011-07-29 MED ORDER — ONDANSETRON HCL 4 MG PO TABS: 4.0000 mg | ORAL_TABLET | Freq: Four times a day (QID) | ORAL | Status: AC | PRN

## 2011-07-29 NOTE — Discharge Summary (Signed)
Physician Discharge Summary  Nathaniel [name] MRN: [id number] DOB/AGE: 1986/02/15 24 y.o.  PCP: Loralyn Freshwater, MD   Admit date: 07/27/2011 Discharge date: 07/29/2011  Discharge Diagnoses:  1. DKA in type 1 diabetes mellitus. 2. Nausea, vomiting, and diarrhea. Possibly secondary to an acute gastroenteritis versus the manifestations of DKA. 3. Tobacco and marijuana abuse. 4. Dehydration. 5. Hypercalcemia secondary to dehydration. Resolved. 6. Hypokalemia, resolved. 7. Leukocytosis. Reactive versus secondary to underlying gastroenteritis. 8. Suspect noncompliance.   Current Discharge Medication List    START taking these medications   Details  ondansetron (ZOFRAN) 4 MG tablet Take 1 tablet (4 mg total) by mouth every 6 (six) hours as needed for nausea. Qty: 20 tablet, Refills: 0      CONTINUE these medications which have NOT CHANGED   Details  insulin aspart (NOVOLOG) 100 UNIT/ML injection Inject 3-15 Units into the skin as directed. Per sliding scale instructions.     insulin glargine (LANTUS) 100 UNIT/ML injection Inject 80 Units into the skin 2 (two) times daily.          Discharge Condition: Improved and stable.  Disposition: Home or Self Care   Consults: None.   Significant Diagnostic Studies: Dg Chest Portable 1 View  07/28/2011  *RADIOLOGY REPORT*  Clinical Data: Diabetes ketoacidosis, leukocytosis, question pneumonia  PORTABLE CHEST - 1 VIEW  Comparison: Portable exam 1025 hours compared to 03/23/2009  Findings: Lordotic positioning. Normal heart size, mediastinal contours, and pulmonary vascularity for technique and positioning. Lungs clear. No pleural effusion or pneumothorax. Bones unremarkable.  IMPRESSION: No acute abnormalities.  Original Report Authenticated By: Burnetta Sabin, M.D.     Microbiology: Recent Results (from the past 240 hour(s))  MRSA PCR SCREENING     Status: Normal   Collection Time   07/27/11  8:21 PM      Component Value Range  Status Comment   MRSA by PCR NEGATIVE  NEGATIVE  Final      Labs: Results for orders placed during the hospital encounter of 07/27/11 (from the past 48 hour(s))  GLUCOSE, CAPILLARY     Status: Abnormal   Collection Time   07/27/11  1:31 PM      Component Value Range Comment   Glucose-Capillary 452 (*) 70 - 99 (mg/dL)   GLUCOSE, CAPILLARY     Status: Abnormal   Collection Time   07/27/11  5:23 PM      Component Value Range Comment   Glucose-Capillary 393 (*) 70 - 99 (mg/dL)   HEMOGLOBIN A1C     Status: Abnormal   Collection Time   07/27/11  5:24 PM      Component Value Range Comment   Hemoglobin A1C 10.6 (*) <5.7 (%)    Mean Plasma Glucose 258 (*) <117 (mg/dL)   COMPREHENSIVE METABOLIC PANEL     Status: Abnormal   Collection Time   07/27/11  5:25 PM      Component Value Range Comment   Sodium 136  135 - 145 (mEq/L)    Potassium 4.7  3.5 - 5.1 (mEq/L)    Chloride 89 (*) 96 - 112 (mEq/L)    CO2 25  19 - 32 (mEq/L)    Glucose, Bld 393 (*) 70 - 99 (mg/dL)    BUN 17  6 - 23 (mg/dL)    Creatinine, Ser 0.84  0.50 - 1.35 (mg/dL)    Calcium 11.1 (*) 8.4 - 10.5 (mg/dL)    Total Protein 9.5 (*) 6.0 - 8.3 (g/dL)  Albumin 4.8  3.5 - 5.2 (g/dL)    AST 19  0 - 37 (U/L)    ALT 21  0 - 53 (U/L)    Alkaline Phosphatase 131 (*) 39 - 117 (U/L)    Total Bilirubin 1.0  0.3 - 1.2 (mg/dL)    GFR calc non Af Amer >90  >90 (mL/min)    GFR calc Af Amer >90  >90 (mL/min)   CBC     Status: Abnormal   Collection Time   07/27/11  5:25 PM      Component Value Range Comment   WBC 22.6 (*) 4.0 - 10.5 (K/uL)    RBC 5.58  4.22 - 5.81 (MIL/uL)    Hemoglobin 17.5 (*) 13.0 - 17.0 (g/dL)    HCT 50.2  39.0 - 52.0 (%)    MCV 90.0  78.0 - 100.0 (fL)    MCH 31.4  26.0 - 34.0 (pg)    MCHC 34.9  30.0 - 36.0 (g/dL)    RDW 13.7  11.5 - 15.5 (%)    Platelets 319  150 - 400 (K/uL)   DIFFERENTIAL     Status: Abnormal   Collection Time   07/27/11  5:25 PM      Component Value Range Comment   Neutrophils Relative  90 (*) 43 - 77 (%)    Neutro Abs 20.4 (*) 1.7 - 7.7 (K/uL)    Lymphocytes Relative 7 (*) 12 - 46 (%)    Lymphs Abs 1.7  0.7 - 4.0 (K/uL)    Monocytes Relative 2 (*) 3 - 12 (%)    Monocytes Absolute 0.5  0.1 - 1.0 (K/uL)    Eosinophils Relative 0  0 - 5 (%)    Eosinophils Absolute 0.0  0.0 - 0.7 (K/uL)    Basophils Relative 0  0 - 1 (%)    Basophils Absolute 0.1  0.0 - 0.1 (K/uL)   GLUCOSE, CAPILLARY     Status: Abnormal   Collection Time   07/27/11  5:48 PM      Component Value Range Comment   Glucose-Capillary 347 (*) 70 - 99 (mg/dL)   URINALYSIS, ROUTINE W REFLEX MICROSCOPIC     Status: Abnormal   Collection Time   07/27/11  6:22 PM      Component Value Range Comment   Color, Urine YELLOW  YELLOW     Appearance CLEAR  CLEAR     Specific Gravity, Urine >1.030 (*) 1.005 - 1.030     pH 5.5  5.0 - 8.0     Glucose, UA >1000 (*) NEGATIVE (mg/dL)    Hgb urine dipstick MODERATE (*) NEGATIVE     Bilirubin Urine NEGATIVE  NEGATIVE     Ketones, ur >80 (*) NEGATIVE (mg/dL)    Protein, ur 100 (*) NEGATIVE (mg/dL)    Urobilinogen, UA 0.2  0.0 - 1.0 (mg/dL)    Nitrite NEGATIVE  NEGATIVE     Leukocytes, UA NEGATIVE  NEGATIVE    URINE MICROSCOPIC-ADD ON     Status: Normal   Collection Time   07/27/11  6:22 PM      Component Value Range Comment   Squamous Epithelial / LPF RARE  RARE     WBC, UA 0-2  <3 (WBC/hpf)    RBC / HPF 11-20  <3 (RBC/hpf)    Bacteria, UA RARE  RARE    BLOOD GAS, ARTERIAL     Status: Abnormal   Collection Time   07/27/11  7:20 PM  Component Value Range Comment   FIO2 21.00      Delivery systems ROOM AIR      pH, Arterial 7.346 (*) 7.350 - 7.450     pCO2 arterial 38.0  35.0 - 45.0 (mmHg)    pO2, Arterial 96.5  80.0 - 100.0 (mmHg)    Bicarbonate 20.3  20.0 - 24.0 (mEq/L)    TCO2 17.7  0 - 100 (mmol/L)    Acid-base deficit 4.4 (*) 0.0 - 2.0 (mmol/L)    O2 Saturation 97.3      Patient temperature 37.0      Collection site RIGHT RADIAL      Drawn by COLLECTED BY  RT      Sample type 22223      Allens test (pass/fail) PASS  PASS    GLUCOSE, CAPILLARY     Status: Abnormal   Collection Time   07/27/11  7:21 PM      Component Value Range Comment   Glucose-Capillary 310 (*) 70 - 99 (mg/dL)   MRSA PCR SCREENING     Status: Normal   Collection Time   07/27/11  8:21 PM      Component Value Range Comment   MRSA by PCR NEGATIVE  NEGATIVE    GLUCOSE, CAPILLARY     Status: Abnormal   Collection Time   07/27/11  9:16 PM      Component Value Range Comment   Glucose-Capillary 234 (*) 70 - 99 (mg/dL)   GLUCOSE, CAPILLARY     Status: Abnormal   Collection Time   07/27/11 10:23 PM      Component Value Range Comment   Glucose-Capillary 183 (*) 70 - 99 (mg/dL)   CARDIAC PANEL(CRET KIN+CKTOT+MB+TROPI)     Status: Normal   Collection Time   07/27/11 11:26 PM      Component Value Range Comment   Total CK 178  7 - 232 (U/L)    CK, MB 3.7  0.3 - 4.0 (ng/mL)    Troponin I <0.30  <0.30 (ng/mL)    Relative Index 2.1  0.0 - 2.5    MAGNESIUM     Status: Normal   Collection Time   07/27/11 11:26 PM      Component Value Range Comment   Magnesium 1.7  1.5 - 2.5 (mg/dL)   PHOSPHORUS     Status: Normal   Collection Time   07/27/11 11:26 PM      Component Value Range Comment   Phosphorus 2.9  2.3 - 4.6 (mg/dL)   BASIC METABOLIC PANEL     Status: Abnormal   Collection Time   07/27/11 11:26 PM      Component Value Range Comment   Sodium 136  135 - 145 (mEq/L)    Potassium 4.0  3.5 - 5.1 (mEq/L)    Chloride 99  96 - 112 (mEq/L) DELTA CHECK NOTED   CO2 23  19 - 32 (mEq/L)    Glucose, Bld 152 (*) 70 - 99 (mg/dL)    BUN 14  6 - 23 (mg/dL)    Creatinine, Ser 0.77  0.50 - 1.35 (mg/dL)    Calcium 9.6  8.4 - 10.5 (mg/dL)    GFR calc non Af Amer >90  >90 (mL/min)    GFR calc Af Amer >90  >90 (mL/min)   GLUCOSE, CAPILLARY     Status: Abnormal   Collection Time   07/27/11 11:29 PM      Component Value Range Comment   Glucose-Capillary 145 (*) 70 -  99 (mg/dL)   GLUCOSE,  CAPILLARY     Status: Abnormal   Collection Time   07/28/11 12:35 AM      Component Value Range Comment   Glucose-Capillary 106 (*) 70 - 99 (mg/dL)   GLUCOSE, CAPILLARY     Status: Normal   Collection Time   07/28/11  1:40 AM      Component Value Range Comment   Glucose-Capillary 76  70 - 99 (mg/dL)    Comment 1 Notify RN     GLUCOSE, CAPILLARY     Status: Abnormal   Collection Time   07/28/11  2:46 AM      Component Value Range Comment   Glucose-Capillary 65 (*) 70 - 99 (mg/dL)   BASIC METABOLIC PANEL     Status: Abnormal   Collection Time   07/28/11  4:02 AM      Component Value Range Comment   Sodium 137  135 - 145 (mEq/L)    Potassium 3.3 (*) 3.5 - 5.1 (mEq/L) DELTA CHECK NOTED   Chloride 101  96 - 112 (mEq/L)    CO2 28  19 - 32 (mEq/L)    Glucose, Bld 59 (*) 70 - 99 (mg/dL)    BUN 14  6 - 23 (mg/dL)    Creatinine, Ser 0.80  0.50 - 1.35 (mg/dL)    Calcium 9.5  8.4 - 10.5 (mg/dL)    GFR calc non Af Amer >90  >90 (mL/min)    GFR calc Af Amer >90  >90 (mL/min)   CBC     Status: Abnormal   Collection Time   07/28/11  4:02 AM      Component Value Range Comment   WBC 15.9 (*) 4.0 - 10.5 (K/uL)    RBC 4.66  4.22 - 5.81 (MIL/uL)    Hemoglobin 14.2  13.0 - 17.0 (g/dL)    HCT 41.0  39.0 - 52.0 (%)    MCV 88.0  78.0 - 100.0 (fL)    MCH 30.5  26.0 - 34.0 (pg)    MCHC 34.6  30.0 - 36.0 (g/dL)    RDW 13.7  11.5 - 15.5 (%)    Platelets 280  150 - 400 (K/uL)   GLUCOSE, CAPILLARY     Status: Abnormal   Collection Time   07/28/11  4:13 AM      Component Value Range Comment   Glucose-Capillary 61 (*) 70 - 99 (mg/dL)   GLUCOSE, CAPILLARY     Status: Abnormal   Collection Time   07/28/11  4:25 AM      Component Value Range Comment   Glucose-Capillary 64 (*) 70 - 99 (mg/dL)   GLUCOSE, CAPILLARY     Status: Abnormal   Collection Time   07/28/11  4:51 AM      Component Value Range Comment   Glucose-Capillary 182 (*) 70 - 99 (mg/dL)   GLUCOSE, CAPILLARY     Status: Abnormal   Collection  Time   07/28/11  5:56 AM      Component Value Range Comment   Glucose-Capillary 145 (*) 70 - 99 (mg/dL)   GLUCOSE, CAPILLARY     Status: Abnormal   Collection Time   07/28/11  7:03 AM      Component Value Range Comment   Glucose-Capillary 114 (*) 70 - 99 (mg/dL)   GLUCOSE, CAPILLARY     Status: Abnormal   Collection Time   07/28/11  8:15 AM      Component Value Range Comment  Glucose-Capillary 69 (*) 70 - 99 (mg/dL)    Comment 1 Notify RN      Comment 2 Documented in Chart     BASIC METABOLIC PANEL     Status: Abnormal   Collection Time   07/28/11  8:17 AM      Component Value Range Comment   Sodium 138  135 - 145 (mEq/L)    Potassium 3.2 (*) 3.5 - 5.1 (mEq/L)    Chloride 100  96 - 112 (mEq/L)    CO2 27  19 - 32 (mEq/L)    Glucose, Bld 64 (*) 70 - 99 (mg/dL)    BUN 12  6 - 23 (mg/dL)    Creatinine, Ser 0.82  0.50 - 1.35 (mg/dL)    Calcium 9.5  8.4 - 10.5 (mg/dL)    GFR calc non Af Amer >90  >90 (mL/min)    GFR calc Af Amer >90  >90 (mL/min)   CARDIAC PANEL(CRET KIN+CKTOT+MB+TROPI)     Status: Abnormal   Collection Time   07/28/11  8:17 AM      Component Value Range Comment   Total CK 192  7 - 232 (U/L)    CK, MB 4.1 (*) 0.3 - 4.0 (ng/mL)    Troponin I <0.30  <0.30 (ng/mL)    Relative Index 2.1  0.0 - 2.5    MAGNESIUM     Status: Normal   Collection Time   07/28/11  8:17 AM      Component Value Range Comment   Magnesium 2.0  1.5 - 2.5 (mg/dL)   PHOSPHORUS     Status: Normal   Collection Time   07/28/11  8:17 AM      Component Value Range Comment   Phosphorus 3.3  2.3 - 4.6 (mg/dL)   LIPASE, BLOOD     Status: Normal   Collection Time   07/28/11  9:34 AM      Component Value Range Comment   Lipase 14  11 - 59 (U/L)   GLUCOSE, CAPILLARY     Status: Abnormal   Collection Time   07/28/11  2:17 PM      Component Value Range Comment   Glucose-Capillary 49 (*) 70 - 99 (mg/dL)   GLUCOSE, CAPILLARY     Status: Abnormal   Collection Time   07/28/11  3:22 PM      Component  Value Range Comment   Glucose-Capillary 67 (*) 70 - 99 (mg/dL)   GLUCOSE, CAPILLARY     Status: Abnormal   Collection Time   07/28/11  6:37 PM      Component Value Range Comment   Glucose-Capillary 149 (*) 70 - 99 (mg/dL)    Comment 1 Notify RN      Comment 2 Documented in Chart     GLUCOSE, CAPILLARY     Status: Normal   Collection Time   07/28/11  8:52 PM      Component Value Range Comment   Glucose-Capillary 84  70 - 99 (mg/dL)    Comment 1 Notify RN     BASIC METABOLIC PANEL     Status: Abnormal   Collection Time   07/29/11  5:14 AM      Component Value Range Comment   Sodium 137  135 - 145 (mEq/L)    Potassium 3.6  3.5 - 5.1 (mEq/L)    Chloride 104  96 - 112 (mEq/L)    CO2 26  19 - 32 (mEq/L)    Glucose, Bld 101 (*)  70 - 99 (mg/dL)    BUN 6  6 - 23 (mg/dL)    Creatinine, Ser 0.71  0.50 - 1.35 (mg/dL)    Calcium 9.0  8.4 - 10.5 (mg/dL)    GFR calc non Af Amer >90  >90 (mL/min)    GFR calc Af Amer >90  >90 (mL/min)   CBC     Status: Abnormal   Collection Time   07/29/11  5:14 AM      Component Value Range Comment   WBC 11.9 (*) 4.0 - 10.5 (K/uL)    RBC 4.49  4.22 - 5.81 (MIL/uL)    Hemoglobin 13.8  13.0 - 17.0 (g/dL)    HCT 39.9  39.0 - 52.0 (%)    MCV 88.9  78.0 - 100.0 (fL)    MCH 30.7  26.0 - 34.0 (pg)    MCHC 34.6  30.0 - 36.0 (g/dL)    RDW 13.8  11.5 - 15.5 (%)    Platelets 235  150 - 400 (K/uL)   GLUCOSE, CAPILLARY     Status: Normal   Collection Time   07/29/11  7:44 AM      Component Value Range Comment   Glucose-Capillary 77  70 - 99 (mg/dL)   GLUCOSE, CAPILLARY     Status: Normal   Collection Time   07/29/11 11:45 AM      Component Value Range Comment   Glucose-Capillary 92  70 - 99 (mg/dL)      HPI : The patient is a type 1 diabetic with a history of multiple admissions for DKA, who presented to the emergency department on 07/27/2011 with a chief complaint of vomiting. He subsequently developed diarrhea. During the evaluation in the emergency department,  he was afebrile and tachycardic with a heart rate of 110-120 beats per minute. His capillary blood glucose was 452. His sodium was 136. His potassium was 4.7. His CO2 was 25. His liver transaminases were within normal limits, except alkaline phosphatase was mildly elevated at 131. His white blood cell count was 22.6. His urinalysis revealed an elevated specific gravity, moderate hemoglobin, greater than 80 ketones, and negative nitrite and leukocytes. His ABG on room air revealed a pH of 7.35, PCO2 of 38, and PO2 of 97. He was admitted for further evaluation and management.  HOSPITAL COURSE: The patient was started on an insulin drip and aggressive IV fluids in the emergency department. His electrolytes were monitored closely. His capillary blood glucose was monitored closely. He was treated symptomatically with as needed Zofran. Protonix was started empirically. A nicotine patch was placed daily. He was advised to stop smoking both marijuana and tobacco. His serum potassium fell to 3.2, and therefore, potassium chloride was added to his IV fluids.  The insulin drip was discontinued. Lantus and sliding scale NovoLog were restarted. He did have one asymptomatic episode of hypoglycemia when his capillary blood glucose fell to 69. He was treated accordingly. His white blood cell count did improve, however, because of the persistent leukocytosis and nausea, and lipase and chest x-ray were ordered. Both were well within normal limits. His diet was advanced from a clear liquid diet to carbohydrate modified diet when his symptoms subsided and then resolve.  It was initially believed that the patient was not in DKA. However, his anion gap was 25, and therefore, it was most likely that he was in DKA. He was discharged to home on his usual dosing of Lantus and sliding scale NovoLog without any changes. He may  need less Lantus and I explained this to him. However, I also suspect some noncompliance. He was advised to  followup with Dr. Dwyane Dee in 4 weeks as scheduled and to followup with his primary care physician in the interim in 1-2 weeks. He voiced understanding. He was also instructed to not skip meals and to follow his sliding scale regimen closely to avoid wide swings in his capillary blood glucose.   Prior to discharge, the patient was eating well. He was afebrile. His white blood cell count improved to 11.9.   Discharge Exam: Blood pressure 137/89, pulse 84, temperature 97.4 F (36.3 C), temperature source Oral, resp. rate 18, height 6\' 1"  (1.854 m), weight 71 kg (156 lb 8.4 oz), SpO2 100.00%. Lungs: Clear to auscultation bilaterally. Heart: S1, S2, with no murmurs rubs or gallops. Abdomen: Positive bowel sounds, nontender, nondistended. Extremities: No pedal edema.    Discharge Orders    Future Orders Please Complete By Expires   Diet Carb Modified      Increase activity slowly      Discharge instructions      Comments:   Avoid skipping meals. Take your insulin as prescribed without missing does. Stay hydrated.      Follow-up Information    Call HALM,STEVEN J. (Call to make a hospital follow up appointment.)    Contact information:   [place] [phone]       Follow up with [facility] on 08/26/2011. (As scheduled.)    Contact information:   [address] [facility] [place] [phone]          Signed: Leocadio Heal 07/29/2011, 12:17 PM

## 2011-07-29 NOTE — Progress Notes (Signed)
Discharge instructions and prescriptions given, verbalized understanding, out ambulatory in stable condition with staff.

## 2011-07-29 NOTE — Progress Notes (Signed)
Utilization review completed.  

## 2011-07-30 LAB — GLUCOSE, CAPILLARY

## 2011-12-27 ENCOUNTER — Encounter (HOSPITAL_COMMUNITY): Payer: Self-pay | Admitting: Emergency Medicine

## 2011-12-27 ENCOUNTER — Emergency Department (HOSPITAL_COMMUNITY)
Admission: EM | Admit: 2011-12-27 | Discharge: 2011-12-27 | Disposition: A | Discharge status: Home / Self Care | Payer: Medicare Other | Attending: Emergency Medicine | Admitting: Emergency Medicine

## 2011-12-27 DIAGNOSIS — K529 Noninfective gastroenteritis and colitis, unspecified: Secondary | ICD-10-CM

## 2011-12-27 DIAGNOSIS — E119 Type 2 diabetes mellitus without complications: Secondary | ICD-10-CM | POA: Insufficient documentation

## 2011-12-27 DIAGNOSIS — Z794 Long term (current) use of insulin: Secondary | ICD-10-CM | POA: Insufficient documentation

## 2011-12-27 DIAGNOSIS — E860 Dehydration: Secondary | ICD-10-CM | POA: Insufficient documentation

## 2011-12-27 DIAGNOSIS — K5289 Other specified noninfective gastroenteritis and colitis: Secondary | ICD-10-CM | POA: Insufficient documentation

## 2011-12-27 LAB — URINE MICROSCOPIC-ADD ON

## 2011-12-27 LAB — URINALYSIS, ROUTINE W REFLEX MICROSCOPIC
Ketones, ur: 40 mg/dL — AB
Leukocytes, UA: NEGATIVE
Nitrite: NEGATIVE
Protein, ur: 30 mg/dL — AB
Urobilinogen, UA: 0.2 mg/dL (ref 0.0–1.0)

## 2011-12-27 LAB — GLUCOSE, CAPILLARY

## 2011-12-27 MED ORDER — PROMETHAZINE HCL 25 MG PO TABS: 25.0000 mg | ORAL_TABLET | Freq: Four times a day (QID) | ORAL | Status: DC | PRN

## 2011-12-27 MED ORDER — SODIUM CHLORIDE 0.9 % IV SOLN
Freq: Once | INTRAVENOUS | Status: AC
  Administered 2011-12-27: 11:00:00 via INTRAVENOUS

## 2011-12-27 MED ORDER — ONDANSETRON HCL 4 MG/2ML IJ SOLN
4.0000 mg | Freq: Once | INTRAMUSCULAR | Status: AC
  Administered 2011-12-27: 4 mg via INTRAVENOUS
  Filled 2011-12-27: qty 2

## 2011-12-27 NOTE — ED Notes (Signed)
Pt c/o n/v abd pain x 2 days

## 2011-12-27 NOTE — Discharge Instructions (Signed)
Plenty of fluids.  Follow up with your md in 2 days for recheck

## 2011-12-27 NOTE — ED Notes (Signed)
Pt requesting ice chips, explained the need to wait til EDP assesses pt first

## 2011-12-27 NOTE — ED Provider Notes (Cosign Needed)
History    This chart was scribed for Maudry Diego, MD, MD by Rhae Lerner. The patient was seen in room APA10 and the patient's care was started at .   CSN: [id number]  Arrival date & time 12/27/11  1001   First MD Initiated Contact with Patient 12/27/11 1147      Chief Complaint  Patient presents with  . Emesis  . Abdominal Pain    (Consider location/radiation/quality/duration/timing/severity/associated sxs/prior treatment) Patient is a 26 y.o. male presenting with vomiting and abdominal pain. The history is provided by the patient (the pt states he has been vomiting for a couple days).  Emesis  This is a new problem. The current episode started 3 to 5 hours ago. The problem occurs 2 to 4 times per day. The problem has not changed since onset.The emesis has an appearance of stomach contents. There has been no fever. Associated symptoms include abdominal pain. Pertinent negatives include no cough, no diarrhea and no headaches. Risk factors: diabetic.  Abdominal Pain The primary symptoms of the illness include abdominal pain and vomiting. The primary symptoms of the illness do not include fatigue or diarrhea.  Symptoms associated with the illness do not include hematuria, frequency or back pain.   Nathaniel [name] is a 26 y.o. male who presents to the Emergency Department complaining of vomiting  Past Medical History  Diagnosis Date  . Diabetes mellitus     lantus/novolog  . Tobacco abuse     5/day  . Marijuana abuse     occaisionally    Past Surgical History  Procedure Date  . Eye surgery     Family History  Problem Relation Age of Onset  . Diabetes Father   . Hypertension Mother   . Kidney failure Father     last 4 mnths of life    History  Substance Use Topics  . Smoking status: Current Everyday Smoker -- 5.0 packs/day for 8 years    Types: Cigarettes  . Smokeless tobacco: Never Used  . Alcohol Use: No      Review of Systems  Constitutional:  Negative for fatigue.  HENT: Negative for congestion, sinus pressure and ear discharge.   Eyes: Negative for discharge.  Respiratory: Negative for cough.   Cardiovascular: Negative for chest pain.  Gastrointestinal: Positive for vomiting and abdominal pain. Negative for diarrhea.  Genitourinary: Negative for frequency and hematuria.  Musculoskeletal: Negative for back pain.  Skin: Negative for rash.  Neurological: Negative for seizures and headaches.  Hematological: Negative.   Psychiatric/Behavioral: Negative for hallucinations.  All other systems reviewed and are negative.   10 Systems reviewed and all are negative for acute change except as noted in the HPI.   Allergies  [place]  Home Medications   Current Outpatient Rx  Name Route Sig Dispense Refill  . INSULIN ASPART 100 UNIT/ML [place] SOLN Subcutaneous Inject 3-15 Units into the skin as directed. Per sliding scale instructions.     . INSULIN GLARGINE 100 UNIT/ML [place] SOLN Subcutaneous Inject 80 Units into the skin 2 (two) times daily.        BP 159/92  Pulse 112  Temp 99 F (37.2 C)  Resp 18  Ht 6\' 1"  (1.854 m)  Wt 169 lb (76.658 kg)  BMI 22.30 kg/m2  SpO2 100%  Physical Exam  Constitutional: He is oriented to person, place, and time. He appears well-developed.  HENT:  Head: Normocephalic and atraumatic.  Eyes: Conjunctivae and EOM are normal. No scleral icterus.  Neck: Neck supple. No thyromegaly present.  Cardiovascular: Normal rate and regular rhythm.  Exam reveals no gallop and no friction rub.   No murmur heard. Pulmonary/Chest: No stridor. He has no wheezes. He has no rales. He exhibits no tenderness.  Abdominal: He exhibits no distension. There is no tenderness. There is no rebound.  Musculoskeletal: Normal range of motion. He exhibits no edema.  Lymphadenopathy:    He has no cervical adenopathy.  Neurological: He is oriented to person, place, and time. Coordination normal.  Skin: No rash noted. No erythema.   Psychiatric: He has a normal mood and affect. His behavior is normal.    ED Course  Procedures (including critical care time) DIAGNOSTIC STUDIES:  COORDINATION OF CARE:    Labs Reviewed  GLUCOSE, CAPILLARY - Abnormal; Notable for the following:    Glucose-Capillary 389 (*)    All other components within normal limits  URINALYSIS, ROUTINE W REFLEX MICROSCOPIC - Abnormal; Notable for the following:    Glucose, UA >1000 (*)    Hgb urine dipstick MODERATE (*)    Ketones, ur 40 (*)    Protein, ur 30 (*)    All other components within normal limits  URINE MICROSCOPIC-ADD ON   No results found.   No diagnosis found.   At discharge pt drinking well no vomiting to abd pain MDM  Gastroenteritis   The chart was scribed for me under my direct supervision.  I personally performed the history, physical, and medical decision making and all procedures in the evaluation of this patient..   The chart was scribed for me under my direct supervision.  I personally performed the history, physical, and medical decision making and all procedures in the evaluation of this patient.Maudry Diego, MD 12/27/11 [id number]

## 2011-12-27 NOTE — ED Notes (Signed)
Pt actively vomiting in pt's room-mostly bile and water, zofran given IV per protocol orders

## 2013-04-08 ENCOUNTER — Encounter (HOSPITAL_COMMUNITY): Payer: Self-pay | Admitting: Emergency Medicine

## 2013-04-08 ENCOUNTER — Inpatient Hospital Stay (HOSPITAL_COMMUNITY)
Admission: EM | Admit: 2013-04-08 | Discharge: 2013-04-10 | DRG: 639 | Disposition: A | Discharge status: Home / Self Care | Payer: Medicare Other | Attending: Family Medicine | Admitting: Family Medicine

## 2013-04-08 DIAGNOSIS — R739 Hyperglycemia, unspecified: Secondary | ICD-10-CM | POA: Diagnosis present

## 2013-04-08 DIAGNOSIS — E1010 Type 1 diabetes mellitus with ketoacidosis without coma: Secondary | ICD-10-CM

## 2013-04-08 DIAGNOSIS — I10 Essential (primary) hypertension: Secondary | ICD-10-CM | POA: Diagnosis present

## 2013-04-08 DIAGNOSIS — E876 Hypokalemia: Secondary | ICD-10-CM

## 2013-04-08 DIAGNOSIS — Z8249 Family history of ischemic heart disease and other diseases of the circulatory system: Secondary | ICD-10-CM

## 2013-04-08 DIAGNOSIS — Z794 Long term (current) use of insulin: Secondary | ICD-10-CM

## 2013-04-08 DIAGNOSIS — F1210 Cannabis abuse, uncomplicated: Secondary | ICD-10-CM | POA: Diagnosis present

## 2013-04-08 DIAGNOSIS — Z23 Encounter for immunization: Secondary | ICD-10-CM

## 2013-04-08 DIAGNOSIS — E1069 Type 1 diabetes mellitus with other specified complication: Principal | ICD-10-CM | POA: Diagnosis present

## 2013-04-08 DIAGNOSIS — E870 Hyperosmolality and hypernatremia: Principal | ICD-10-CM | POA: Diagnosis present

## 2013-04-08 DIAGNOSIS — E860 Dehydration: Secondary | ICD-10-CM | POA: Diagnosis present

## 2013-04-08 DIAGNOSIS — F17200 Nicotine dependence, unspecified, uncomplicated: Secondary | ICD-10-CM | POA: Diagnosis present

## 2013-04-08 DIAGNOSIS — R7309 Other abnormal glucose: Secondary | ICD-10-CM

## 2013-04-08 DIAGNOSIS — Z833 Family history of diabetes mellitus: Secondary | ICD-10-CM

## 2013-04-08 DIAGNOSIS — R112 Nausea with vomiting, unspecified: Secondary | ICD-10-CM | POA: Diagnosis present

## 2013-04-08 LAB — RAPID URINE DRUG SCREEN, HOSP PERFORMED
Benzodiazepines: NOT DETECTED
Cocaine: NOT DETECTED
Opiates: NOT DETECTED
Tetrahydrocannabinol: POSITIVE — AB

## 2013-04-08 LAB — CBC WITH DIFFERENTIAL/PLATELET
Basophils Absolute: 0.1 10*3/uL (ref 0.0–0.1)
Basophils Relative: 1 % (ref 0–1)
Lymphocytes Relative: 18 % (ref 12–46)
MCHC: 35.2 g/dL (ref 30.0–36.0)
Neutro Abs: 11.4 10*3/uL — ABNORMAL HIGH (ref 1.7–7.7)
Neutrophils Relative %: 78 % — ABNORMAL HIGH (ref 43–77)
Platelets: 215 10*3/uL (ref 150–400)
RDW: 13.4 % (ref 11.5–15.5)
WBC: 14.7 10*3/uL — ABNORMAL HIGH (ref 4.0–10.5)

## 2013-04-08 LAB — GLUCOSE, CAPILLARY
Glucose-Capillary: >600 mg/dL (ref 70–99)
Glucose-Capillary: 245 mg/dL — ABNORMAL HIGH (ref 70–99)
Glucose-Capillary: 220 mg/dL — ABNORMAL HIGH (ref 70–99)
Glucose-Capillary: 154 mg/dL — ABNORMAL HIGH (ref 70–99)

## 2013-04-08 LAB — BLOOD GAS, ARTERIAL
FIO2: 21 %
O2 Saturation: 96.4 %
pH, Arterial: 7.418 (ref 7.350–7.450)
pO2, Arterial: 83 mmHg (ref 80.0–100.0)

## 2013-04-08 LAB — MRSA PCR SCREENING: MRSA by PCR: NEGATIVE

## 2013-04-08 LAB — HEPATIC FUNCTION PANEL
AST: 32 U/L (ref 0–37)
Albumin: 4.1 g/dL (ref 3.5–5.2)
Total Protein: 8.8 g/dL — ABNORMAL HIGH (ref 6.0–8.3)

## 2013-04-08 LAB — URINALYSIS, ROUTINE W REFLEX MICROSCOPIC
Nitrite: NEGATIVE
Specific Gravity, Urine: 1.025 (ref 1.005–1.030)
Urobilinogen, UA: 0.2 mg/dL (ref 0.0–1.0)
pH: 6 (ref 5.0–8.0)

## 2013-04-08 LAB — URINE MICROSCOPIC-ADD ON

## 2013-04-08 LAB — BASIC METABOLIC PANEL
CO2: 25 mEq/L (ref 19–32)
Chloride: 91 mEq/L — ABNORMAL LOW (ref 96–112)
Creatinine, Ser: 0.72 mg/dL (ref 0.50–1.35)
GFR calc Af Amer: >90 mL/min (ref >90)
Potassium: 4.4 mEq/L (ref 3.5–5.1)
Sodium: 132 mEq/L — ABNORMAL LOW (ref 135–145)

## 2013-04-08 LAB — LIPASE, BLOOD: Lipase: 59 U/L (ref 11–59)

## 2013-04-08 MED ORDER — SODIUM CHLORIDE 0.9 % IV SOLN
INTRAVENOUS | Status: DC
  Administered 2013-04-08: 16:00:00 via INTRAVENOUS

## 2013-04-08 MED ORDER — DEXTROSE-NACL 5-0.45 % IV SOLN: INTRAVENOUS | Status: DC

## 2013-04-08 MED ORDER — ACETAMINOPHEN 650 MG RE SUPP: 650.0000 mg | Freq: Four times a day (QID) | RECTAL | Status: DC | PRN

## 2013-04-08 MED ORDER — ONDANSETRON HCL 4 MG PO TABS: 4.0000 mg | ORAL_TABLET | Freq: Four times a day (QID) | ORAL | Status: DC | PRN

## 2013-04-08 MED ORDER — ONDANSETRON HCL 4 MG/2ML IJ SOLN
4.0000 mg | Freq: Four times a day (QID) | INTRAMUSCULAR | Status: DC | PRN
  Administered 2013-04-08 – 2013-04-10 (×2): 4 mg via INTRAVENOUS
  Filled 2013-04-08 (×2): qty 2

## 2013-04-08 MED ORDER — SODIUM CHLORIDE 0.9 % IV SOLN
INTRAVENOUS | Status: AC
  Administered 2013-04-08: 2.3 [IU]/h via INTRAVENOUS
  Filled 2013-04-08: qty 1

## 2013-04-08 MED ORDER — ONDANSETRON HCL 4 MG/2ML IJ SOLN: 4.0000 mg | Freq: Three times a day (TID) | INTRAMUSCULAR | Status: DC | PRN

## 2013-04-08 MED ORDER — SODIUM CHLORIDE 0.9 % IV BOLUS (SEPSIS)
2000.0000 mL | Freq: Once | INTRAVENOUS | Status: AC
  Administered 2013-04-08: 1000 mL via INTRAVENOUS

## 2013-04-08 MED ORDER — DEXTROSE 50 % IV SOLN
25.0000 mL | INTRAVENOUS | Status: DC | PRN
  Administered 2013-04-09: 50 mL via INTRAVENOUS
  Filled 2013-04-08: qty 50

## 2013-04-08 MED ORDER — SODIUM CHLORIDE 0.9 % IV SOLN
INTRAVENOUS | Status: DC
  Administered 2013-04-08: 17:00:00 via INTRAVENOUS

## 2013-04-08 MED ORDER — SODIUM CHLORIDE 0.9 % IV SOLN
1000.0000 mL | Freq: Once | INTRAVENOUS | Status: AC
  Administered 2013-04-08: 1000 mL via INTRAVENOUS

## 2013-04-08 MED ORDER — ENOXAPARIN SODIUM 40 MG/0.4ML ~~LOC~~ SOLN
40.0000 mg | SUBCUTANEOUS | Status: DC
  Administered 2013-04-08 – 2013-04-09 (×2): 40 mg via SUBCUTANEOUS
  Filled 2013-04-08 (×2): qty 0.4

## 2013-04-08 MED ORDER — ACETAMINOPHEN 325 MG PO TABS: 650.0000 mg | ORAL_TABLET | Freq: Four times a day (QID) | ORAL | Status: DC | PRN

## 2013-04-08 MED ORDER — SODIUM CHLORIDE 0.9 % IV SOLN
INTRAVENOUS | Status: DC
  Administered 2013-04-08: 5.8 [IU]/h via INTRAVENOUS
  Filled 2013-04-08: qty 1

## 2013-04-08 MED ORDER — INSULIN REGULAR HUMAN 100 UNIT/ML IJ SOLN
INTRAMUSCULAR | Status: AC
  Filled 2013-04-08: qty 3

## 2013-04-08 MED ORDER — INSULIN REGULAR BOLUS VIA INFUSION
0.0000 [IU] | Freq: Three times a day (TID) | INTRAVENOUS | Status: DC
  Filled 2013-04-08: qty 10

## 2013-04-08 MED ORDER — SODIUM CHLORIDE 0.9 % IV SOLN: INTRAVENOUS | Status: DC

## 2013-04-08 MED ORDER — SODIUM CHLORIDE 0.45 % IV SOLN: INTRAVENOUS | Status: DC

## 2013-04-08 MED ORDER — ONDANSETRON HCL 4 MG/2ML IJ SOLN
4.0000 mg | INTRAMUSCULAR | Status: DC | PRN
  Administered 2013-04-08: 4 mg via INTRAVENOUS
  Filled 2013-04-08: qty 2

## 2013-04-08 MED ORDER — DEXTROSE-NACL 5-0.45 % IV SOLN
INTRAVENOUS | Status: AC
  Administered 2013-04-08: 1000 mL via INTRAVENOUS

## 2013-04-08 MED ORDER — DEXTROSE 50 % IV SOLN: 25.0000 mL | INTRAVENOUS | Status: DC | PRN

## 2013-04-08 NOTE — H&P (Signed)
[facility] History and Physical  Nathaniel [name] [id number] DOB: Jan 28, 1986 DOA: 04/08/2013  Referring physician: Dr. Thurnell Garbe, ER physician PCP: Maricela Curet, MD  Specialists:   Chief Complaint: Nausea and vomiting  HPI: Nathaniel [name] is a 27 y.o. male with type 1 diabetes who presents to the hospital with complaints of nausea and vomiting. Symptoms started earlier this morning. They're associated with crampy abdominal pain, he also has loose stools. The patient reports that he has been compliant with his long-acting insulin. He was previously prescribed NovoLog on a sliding scale has not taken this for many months. He reports his blood sugars usually run in the 200s. He has not had any fevers, cough, shortness of breath, cold/flu type symptoms. He's had no dysuria. He was evaluated in the emergency room and was noted to have elevated blood sugar in the 600s. He did not have any significant acidosis her anion gap was found to be 16. The patient has been referred for admission  Review of Systems: Pertinent positives as per history of present illness, otherwise negative  Past Medical History  Diagnosis Date  . Diabetes mellitus     lantus/novolog  . Tobacco abuse     5/day  . Marijuana abuse     occaisionally   Past Surgical History  Procedure Laterality Date  . Eye surgery     Social History:  reports that he has been smoking Cigarettes.  He has a 40 pack-year smoking history. He has never used smokeless tobacco. He reports that he uses illicit drugs (Marijuana). He reports that he does not drink alcohol.   Allergies  Allergen Reactions  . Orange     Increases blood sugar immediately     Family History  Problem Relation Age of Onset  . Diabetes Father   . Hypertension Mother   . Kidney failure Father     last 4 mnths of life     Prior to Admission medications   Medication Sig Start Date End Date Taking? Authorizing Provider  insulin detemir  (LEVEMIR) 100 UNIT/ML injection Inject 60-70 Units into the skin 2 (two) times daily. Takes 70 units in the morning and 60 units in the evening   Yes Historical Provider, MD  naproxen sodium (ALEVE) 220 MG tablet Take 220 mg by mouth 2 (two) times daily as needed.   Yes Historical Provider, MD  simvastatin (ZOCOR) 20 MG tablet Take 20 mg by mouth every evening.   Yes Historical Provider, MD  insulin aspart (NOVOLOG) 100 UNIT/ML injection Inject 3-15 Units into the skin as directed. Per sliding scale instructions.     Historical Provider, MD   Physical Exam: Filed Vitals:   04/08/13 1503 04/08/13 1836  BP: 209/111 172/99  Pulse: 79 93  Temp: 97.4 F (36.3 C) 97.8 F (36.6 C)  TempSrc: Oral Oral  Resp: 30   Height: 6' (1.829 m) 6\' 1"  (1.854 m)  Weight: 72.576 kg (160 lb) 94.9 kg (209 lb 3.5 oz)  SpO2: 100% 100%     General:  No acute distress  Eyes: Pupils are equal round and reactive to light  ENT: Mucous membranes are dry  Neck: Supple  Cardiovascular: S1, S2, tachycardic  Respiratory: Clear to auscultation bilaterally  Abdomen: Soft, nontender, positive bowel sounds  Skin: No rashes  Musculoskeletal: No pedal edema bilaterally  Psychiatric: Normal affect, cooperative with exam  Neurologic: Grossly intact, nonfocal  Labs on Admission:  Basic Metabolic Panel:  Recent Labs Lab 04/08/13 1537  NA 132*  K 4.4  CL 91*  CO2 25  GLUCOSE 635*  BUN 19  CREATININE 0.72  CALCIUM 10.7*   Liver Function Tests: No results found for this basename: AST, ALT, ALKPHOS, BILITOT, PROT, ALBUMIN,  in the last 168 hours No results found for this basename: LIPASE, AMYLASE,  in the last 168 hours No results found for this basename: AMMONIA,  in the last 168 hours CBC:  Recent Labs Lab 04/08/13 1537  WBC 14.7*  NEUTROABS 11.4*  HGB 14.6  HCT 41.5  MCV 90.0  PLT 215   Cardiac Enzymes: No results found for this basename: CKTOTAL, CKMB, CKMBINDEX, TROPONINI,  in the last  168 hours  BNP (last 3 results) No results found for this basename: PROBNP,  in the last 8760 hours CBG:  Recent Labs Lab 04/08/13 1505 04/08/13 1716 04/08/13 1813  GLUCAP >600* 477* 440*    Radiological Exams on Admission: No results found.  EKG: Independently reviewed. No acute ST changes, changes of LVH  Assessment/Plan Active Problems:   Dehydration   Tobacco abuse   Nausea and vomiting   Hyperglycemia without ketosis   Essential hypertension, benign   1. Hyperglycemia without ketosis. Patient has been started on IV fluids and insulin infusion. Once his blood sugars have come down in the goal range, he can be transitioned back to Levemir. We will also check lipase and hepatic function panel since he is having significant vomiting. Patient was counseled on importance of compliance with his insulin regimen and checking blood sugars frequently. 2. Hypertension. Patient's blood pressure is running in the 180s to 200s. He does not take any antihypertensives based on his home medication list. We'll check a urine tox screen. 3. Nausea and vomiting. Treat supportively with antiemetics. Start on liquids and that can be advanced as tolerated. Likely related to hyperglycemia and dehydration. 4. Tobacco abuse. Counseled on the importance of tobacco cessation.    Code Status: Full code Family Communication: Discussed with patient and mother at the bedside  Disposition Plan: Discharge home once improved   Time spent: 71mins  Damione Robideau [facility] Pager [phone]  If 7PM-7AM, please contact night-coverage [email or web address] Password [email or web address] 04/08/2013, 6:42 PM

## 2013-04-08 NOTE — ED Notes (Signed)
Pt thinks he is in DKA. Pt in triage vomiting. States this is how he feels when he has DKA. Pt c/o all over abd pain/v/n/d since this am.

## 2013-04-08 NOTE — ED Provider Notes (Signed)
History    CSN: [id number] Arrival date & time 04/08/13  1453  First MD Initiated Contact with Patient 04/08/13 1522     Chief Complaint  Patient presents with  . Abdominal Pain  . Hyperglycemia  . Emesis    HPI Pt was seen at 1545.  Per pt and his mother, c/o gradual onset and worsening of persistent "high blood sugars" since this morning. Pt states he last checked his home CBG approx 3 to 5 days ago and it was "in the 200's." Pt states he began to feel generally weak this morning, with generalized abd pain and several episodes of N/V. States he "feels like I'm in DKA again."  Denies CP/palpitations, no SOB/cough, no back pain, no diarrhea, no black or blood in stools or emesis.    Past Medical History  Diagnosis Date  . Diabetes mellitus     lantus/novolog  . Tobacco abuse     5/day  . Marijuana abuse     occaisionally   Past Surgical History  Procedure Laterality Date  . Eye surgery     Family History  Problem Relation Age of Onset  . Diabetes Father   . Hypertension Mother   . Kidney failure Father     last 4 mnths of life   History  Substance Use Topics  . Smoking status: Current Every Day Smoker -- 5.00 packs/day for 8 years    Types: Cigarettes  . Smokeless tobacco: Never Used  . Alcohol Use: No    Review of Systems ROS: Statement: All systems negative except as marked or noted in the HPI; Constitutional: Negative for fever and chills. +generalized weakness/fatigue.;; ; Eyes: Negative for eye pain, redness and discharge. ; ; ENMT: Negative for ear pain, hoarseness, nasal congestion, sinus pressure and sore throat. ; ; Cardiovascular: Negative for chest pain, palpitations, diaphoresis, dyspnea and peripheral edema. ; ; Respiratory: Negative for cough, wheezing and stridor. ; ; Gastrointestinal: +N/V, abd pain. Negative for diarrhea, blood in stool, hematemesis, jaundice and rectal bleeding. . ; ; Genitourinary: Negative for dysuria, flank pain and hematuria. ; ;  Musculoskeletal: Negative for back pain and neck pain. Negative for swelling and trauma.; ; Skin: Negative for pruritus, rash, abrasions, blisters, bruising and skin lesion.; ; Neuro: Negative for headache, lightheadedness and neck stiffness. Negative for altered level of consciousness , altered mental status, extremity weakness, paresthesias, involuntary movement, seizure and syncope.       Allergies  Orange  Home Medications   Current Outpatient Rx  Name  Route  Sig  Dispense  Refill  . insulin detemir (LEVEMIR) 100 UNIT/ML injection   Subcutaneous   Inject 60-70 Units into the skin 2 (two) times daily. Takes 70 units in the morning and 60 units in the evening         . naproxen sodium (ALEVE) 220 MG tablet   Oral   Take 220 mg by mouth 2 (two) times daily as needed.         . insulin aspart (NOVOLOG) 100 UNIT/ML injection   Subcutaneous   Inject 3-15 Units into the skin as directed. Per sliding scale instructions.           BP 209/111  Pulse 79  Temp(Src) 97.4 F (36.3 C) (Oral)  Resp 30  Ht 6' (1.829 m)  Wt 160 lb (72.576 kg)  BMI 21.7 kg/m2  SpO2 100% Physical Exam 1550: Physical examination:  Nursing notes reviewed; Vital signs and O2 SAT reviewed;  Constitutional: Well  developed, Well nourished, Uncomfortable appearing; Head:  Normocephalic, atraumatic; Eyes: EOMI, PERRL, No scleral icterus; ENMT: Mouth and pharynx normal, Mucous membranes dry; Neck: Supple, Full range of motion, No lymphadenopathy; Cardiovascular: Regular rate and rhythm, No murmur, rub, or gallop; Respiratory: Breath sounds clear & equal bilaterally, No rales, rhonchi, wheezes.  Speaking full sentences with ease, Normal respiratory effort/excursion; Chest: Nontender, Movement normal; Abdomen: Soft, Nontender, Nondistended, Normal bowel sounds; Genitourinary: No CVA tenderness; Extremities: Pulses normal, No tenderness, No edema, No calf edema or asymmetry.; Neuro: AA&Ox3, Major CN grossly intact.   Speech clear. No gross focal motor or sensory deficits in extremities.; Skin: Color normal, Warm, Dry.   ED Course  Procedures     MDM  MDM Reviewed: previous chart, nursing note and vitals Reviewed previous: ECG and labs Interpretation: labs and ECG Total time providing critical care: 30-74 minutes. This excludes time spent performing separately reportable procedures and services. Consults: admitting MD   CRITICAL CARE Performed by: Alfonzo Feller Total critical care time: 31 Critical care time was exclusive of separately billable procedures and treating other patients. Critical care was necessary to treat or prevent imminent or life-threatening deterioration. Critical care was time spent personally by me on the following activities: development of treatment plan with patient and/or surrogate as well as nursing, discussions with consultants, evaluation of patient's response to treatment, examination of patient, obtaining history from patient or surrogate, ordering and performing treatments and interventions, ordering and review of laboratory studies, ordering and review of radiographic studies, pulse oximetry and re-evaluation of patient's condition.    Date: 04/08/2013  Rate: 84  Rhythm: normal sinus rhythm  QRS Axis: normal  Intervals: normal  ST/T Wave abnormalities: early repolarization  Conduction Disutrbances:none  Narrative Interpretation:   Old EKG Reviewed: unchanged; no significant changes from previous EKG dated 07/27/2011.  Results for orders placed during the hospital encounter of 04/08/13  GLUCOSE, CAPILLARY      Result Value Range   Glucose-Capillary >600 (*) 70 - 99 mg/dL  CBC WITH DIFFERENTIAL      Result Value Range   WBC 14.7 (*) 4.0 - 10.5 K/uL   RBC 4.61  4.22 - 5.81 MIL/uL   Hemoglobin 14.6  13.0 - 17.0 g/dL   HCT 41.5  39.0 - 52.0 %   MCV 90.0  78.0 - 100.0 fL   MCH 31.7  26.0 - 34.0 pg   MCHC 35.2  30.0 - 36.0 g/dL   RDW 13.4  11.5 - 15.5 %    Platelets 215  150 - 400 K/uL   Neutrophils Relative % 78 (*) 43 - 77 %   Neutro Abs 11.4 (*) 1.7 - 7.7 K/uL   Lymphocytes Relative 18  12 - 46 %   Lymphs Abs 2.7  0.7 - 4.0 K/uL   Monocytes Relative 3  3 - 12 %   Monocytes Absolute 0.4  0.1 - 1.0 K/uL   Eosinophils Relative 0  0 - 5 %   Eosinophils Absolute 0.1  0.0 - 0.7 K/uL   Basophils Relative 1  0 - 1 %   Basophils Absolute 0.1  0.0 - 0.1 K/uL  BASIC METABOLIC PANEL      Result Value Range   Sodium 132 (*) 135 - 145 mEq/L   Potassium 4.4  3.5 - 5.1 mEq/L   Chloride 91 (*) 96 - 112 mEq/L   CO2 25  19 - 32 mEq/L   Glucose, Bld 635 (*) 70 - 99 mg/dL   BUN 19  6 - 23 mg/dL   Creatinine, Ser 0.72  0.50 - 1.35 mg/dL   Calcium 10.7 (*) 8.4 - 10.5 mg/dL   GFR calc non Af Amer >90  >90 mL/min   GFR calc Af Amer >90  >90 mL/min  BLOOD GAS, ARTERIAL      Result Value Range   FIO2 21.00     pH, Arterial 7.418  7.350 - 7.450   pCO2 arterial 38.6  35.0 - 45.0 mmHg   pO2, Arterial 83.0  80.0 - 100.0 mmHg   Bicarbonate 24.4 (*) 20.0 - 24.0 mEq/L   TCO2 21.6  0 - 100 mmol/L   Acid-Base Excess 0.4  0.0 - 2.0 mmol/L   O2 Saturation 96.4     Patient temperature 37.0     Collection site LEFT RADIAL     Drawn by COLLECTED BY RT     Sample type ARTERIAL     Allens test (pass/fail) PASS  PASS    1655:  Hyperglycemic but not ketotic. IVF infusing, followed by insulin gtt.  Dx and testing d/w pt and family.  Questions answered.  Verb understanding, agreeable to observation admit.  T/C to [facility] Dr. Roderic Palau, case discussed, including:  HPI, pertinent PM/SHx, VS/PE, dx testing, ED course and treatment:  Agreeable to observation admit, requests to write temporary orders, obtain stepdown bed to Dr. Denita Lung service.         Alfonzo Feller, DO 04/09/13 1653

## 2013-04-08 NOTE — ED Notes (Signed)
CRITICAL VALUE ALERT  Critical value received:  Glucose 635  Date of notification:  04/08/13  Time of notification:  1615  Critical value read back:yes  Nurse who received alert:  B. Olena Heckle, RN  MD notified (1st page):  Thurnell Garbe  Time of first page:  53  MD notified (2nd page):  Time of second page:  Responding MD:  Thurnell Garbe  Time MD responded:  [phone]

## 2013-04-08 NOTE — ED Notes (Signed)
Pt presents with N/V,abdominal pain, diaphoretic and generalized weakness since this morning. Pt states he believes he is in DKA, as this has happened before. CBG obtained in triage. EMS students at bedside attempting to draw blood and place IV.

## 2013-04-09 LAB — BASIC METABOLIC PANEL
BUN: 18 mg/dL (ref 6–23)
BUN: 14 mg/dL (ref 6–23)
CO2: 27 mEq/L (ref 19–32)
Chloride: 97 mEq/L (ref 96–112)
GFR calc non Af Amer: >90 mL/min (ref >90)
GFR calc non Af Amer: >90 mL/min (ref >90)
Glucose, Bld: 277 mg/dL — ABNORMAL HIGH (ref 70–99)
Glucose, Bld: 50 mg/dL — ABNORMAL LOW (ref 70–99)
Potassium: 3.6 mEq/L (ref 3.5–5.1)
Potassium: 3.1 mEq/L — ABNORMAL LOW (ref 3.5–5.1)
Sodium: 136 mEq/L (ref 135–145)

## 2013-04-09 LAB — GLUCOSE, CAPILLARY
Glucose-Capillary: 138 mg/dL — ABNORMAL HIGH (ref 70–99)
Glucose-Capillary: 159 mg/dL — ABNORMAL HIGH (ref 70–99)
Glucose-Capillary: 125 mg/dL — ABNORMAL HIGH (ref 70–99)
Glucose-Capillary: 165 mg/dL — ABNORMAL HIGH (ref 70–99)
Glucose-Capillary: 253 mg/dL — ABNORMAL HIGH (ref 70–99)
Glucose-Capillary: 65 mg/dL — ABNORMAL LOW (ref 70–99)
Glucose-Capillary: 58 mg/dL — ABNORMAL LOW (ref 70–99)
Glucose-Capillary: 157 mg/dL — ABNORMAL HIGH (ref 70–99)
Glucose-Capillary: 28 mg/dL — CL (ref 70–99)
Glucose-Capillary: 52 mg/dL — ABNORMAL LOW (ref 70–99)

## 2013-04-09 LAB — HEMOGLOBIN A1C: Hgb A1c MFr Bld: 10.7 % — ABNORMAL HIGH (ref <5.7)

## 2013-04-09 LAB — CBC
HCT: 34.7 % — ABNORMAL LOW (ref 39.0–52.0)
Hemoglobin: 12 g/dL — ABNORMAL LOW (ref 13.0–17.0)
MCH: 30.7 pg (ref 26.0–34.0)
MCV: 88.7 fL (ref 78.0–100.0)
RBC: 3.91 MIL/uL — ABNORMAL LOW (ref 4.22–5.81)

## 2013-04-09 MED ORDER — INSULIN ASPART 100 UNIT/ML ~~LOC~~ SOLN
0.0000 [IU] | Freq: Three times a day (TID) | SUBCUTANEOUS | Status: DC
  Administered 2013-04-09: 3 [IU] via SUBCUTANEOUS
  Administered 2013-04-10 (×2): 5 [IU] via SUBCUTANEOUS

## 2013-04-09 MED ORDER — PNEUMOCOCCAL VAC POLYVALENT 25 MCG/0.5ML IJ INJ
0.5000 mL | INJECTION | INTRAMUSCULAR | Status: AC
  Administered 2013-04-10: 0.5 mL via INTRAMUSCULAR
  Filled 2013-04-09 (×2): qty 0.5

## 2013-04-09 MED ORDER — LISINOPRIL 10 MG PO TABS
10.0000 mg | ORAL_TABLET | Freq: Every day | ORAL | Status: DC
  Administered 2013-04-09 – 2013-04-10 (×2): 10 mg via ORAL
  Filled 2013-04-09 (×2): qty 1

## 2013-04-09 MED ORDER — PROMETHAZINE HCL 25 MG/ML IJ SOLN
12.5000 mg | INTRAMUSCULAR | Status: DC | PRN
  Administered 2013-04-09: 12.5 mg via INTRAVENOUS
  Filled 2013-04-09: qty 1

## 2013-04-09 MED ORDER — INSULIN DETEMIR 100 UNIT/ML ~~LOC~~ SOLN
70.0000 [IU] | Freq: Every day | SUBCUTANEOUS | Status: DC
  Administered 2013-04-10: 70 [IU] via SUBCUTANEOUS
  Filled 2013-04-09 (×4): qty 0.7

## 2013-04-09 MED ORDER — DEXTROSE 50 % IV SOLN
25.0000 mL | Freq: Once | INTRAVENOUS | Status: AC | PRN
  Administered 2013-04-09: 25 mL via INTRAVENOUS

## 2013-04-09 MED ORDER — INSULIN DETEMIR 100 UNIT/ML ~~LOC~~ SOLN
SUBCUTANEOUS | Status: AC
  Filled 2013-04-09: qty 1

## 2013-04-09 MED ORDER — INSULIN DETEMIR 100 UNIT/ML ~~LOC~~ SOLN
60.0000 [IU] | Freq: Every day | SUBCUTANEOUS | Status: DC
  Filled 2013-04-09 (×2): qty 0.6

## 2013-04-09 MED ORDER — INSULIN DETEMIR 100 UNIT/ML ~~LOC~~ SOLN
60.0000 [IU] | Freq: Two times a day (BID) | SUBCUTANEOUS | Status: DC
  Administered 2013-04-09: 60 [IU] via SUBCUTANEOUS
  Filled 2013-04-09: qty 0.6

## 2013-04-09 MED ORDER — DEXTROSE 50 % IV SOLN
INTRAVENOUS | Status: AC
  Administered 2013-04-09: 50 mL via INTRAVENOUS
  Filled 2013-04-09: qty 50

## 2013-04-09 MED ORDER — PANTOPRAZOLE SODIUM 40 MG IV SOLR
40.0000 mg | Freq: Two times a day (BID) | INTRAVENOUS | Status: DC
  Administered 2013-04-09 – 2013-04-10 (×3): 40 mg via INTRAVENOUS
  Filled 2013-04-09 (×3): qty 40

## 2013-04-09 MED ORDER — INSULIN ASPART 100 UNIT/ML ~~LOC~~ SOLN: 0.0000 [IU] | Freq: Every day | SUBCUTANEOUS | Status: DC

## 2013-04-09 NOTE — Progress Notes (Signed)
Hypoglycemic Event  CBG: 65  Treatment: 15 GM carbohydrate snack  Then gave 63ml of Dextrose.  Symptoms: None  Follow-up CBG: Time: 1142 CBG Result: 58  Possible Reasons for Event: Vomiting  Comments/MD notified: Apple juice given, follow up CBG 58. 25 ml of Dextrose given. Follow up CBG 94    Nathaniel [name], Nathaniel [name]  Remember to initiate Hypoglycemia Order Set & complete

## 2013-04-09 NOTE — Progress Notes (Signed)
Nausea continues with vomitus, treated with zofran

## 2013-04-09 NOTE — Care Management Note (Addendum)
    Page 1 of 1   04/10/2013     2:19:46 PM   CARE MANAGEMENT NOTE 04/10/2013  Patient:  KOHEI, BOORTZ   Account Number:  [id number]  Date Initiated:  04/09/2013  Documentation initiated by:  Theophilus Kinds  Subjective/Objective Assessment:   Pt admitted from home with hyperglycemia. Pt lives with his mom and will return home at discharge. Pt is independent with ADLs.     Action/Plan:   Pt stated to CM that he is compliant with medications and has no trouble with obtaining medication or supplies for DM. No other CM needs noted.   Anticipated DC Date:  04/11/2013   Anticipated DC Plan:  [place]  CM consult      Choice offered to / List presented to:             Status of service:  Completed, signed off Medicare Important Message given?  NA - LOS <3 / Initial given by admissions (If response is "NO", the following Medicare IM given date fields will be blank) Date Medicare IM given:   Date Additional Medicare IM given:    Discharge Disposition:  HOME/SELF CARE  Per UR Regulation:    If discussed at Long Length of Stay Meetings, dates discussed:    Comments:  04/10/13 [place], RN BSN CM Pt discharged home today. No Cm needs noted.  04/09/13 [place], RN BSN CM

## 2013-04-09 NOTE — Progress Notes (Signed)
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 as well as NovoLog sliding scale.  He had not been taking a sliding scale.  Came in with a nonketotic hyperosmolar state.  Blood sugar in excess of 600s with normal CO2 and anion gap of 16.  His creatinine is 1.0, currently on Glucommander protocol and Levemir was ordered 60 units subcu b.i.d. after normalization of blood sugars to the target range.  PHYSICAL EXAMINATION:  LUNGS:  Clear.  No rales, wheeze, or rhonchi. HEART:  Regular rhythm.  No murmurs, gallops, or rubs. ABDOMEN:  Soft, nontender.  Bowel sounds normoactive. VITAL SIGNS:  Blood pressure 118/57, temperature 97.8, pulse 90 and regular, respiratory rate is 15, hemoglobin 12.  PLAN:  Right now is to continue Glucommander protocol, reinstitute Levemir and then NovoLog subsequently a.c. and h.s., q.4h blood glucoses.  We will resume lisinopril 10 mg for renal protective effect and we will make further recommendations as the database expands.     Unk Lightning, MD     RMD/MEDQ  D:  04/09/2013  T:  04/09/2013  Job:  [id number]

## 2013-04-09 NOTE — Progress Notes (Signed)
UR chart review completed.  

## 2013-04-09 NOTE — Plan of Care (Signed)
Problem: Consults Goal: Diabetes Mellitus Patient Education See Patient Education Module for education specifics.  Outcome: Progressing Patient on insulin gtt for elevated blood sugars Goal: Diagnosis-Diabetes Mellitus Hyperglycemia  Blood sugar 600 on admission Goal: Nutrition Consult-if indicated Outcome: Progressing Patient with nausea and vomitus only clear watery emesis, and patient is drinking water and ginger ale (diet)  Problem: Phase I Progression Outcomes Goal: Pain controlled with appropriate interventions Outcome: Not Applicable Date Met:  [id number] No complaints of pain Goal: Initial discharge plan identified Outcome: Progressing Home when stable Goal: Hemodynamically stable Outcome: Progressing Hypertension tonight, but during sleep BP WNL

## 2013-04-09 NOTE — Progress Notes (Signed)
Pt nauseated. Zofran given earlier. Pt also complaining of abdominal cramps.  Dr [name] paged and made aware. Order received for IV Protonix and IV Phenergen. Also order to transfer pt to tele.  Will continue to monitor.

## 2013-04-09 NOTE — Progress Notes (Signed)
Pt to be transferred to room 224 per MD order. Report given to RN. Pt transferred via wheelchair with personal belongings. Pts mother aware of transfer.

## 2013-04-09 NOTE — Progress Notes (Signed)
Spoke with pt about diabetes control and management as an outpatient. Discussed A1C results (last results 10.6% on 07/27/2011) with him.  Patient reports that he takes Levemir 70 units QAM and Levemir 60 units QPM as an outpatient for diabetes control.  Patient reports that he used to take Novolog also but he stopped taking the Novolog about one month ago because he was having frequent hypoglycemic episodes (at least 2-3 times per week).  He states that he used to see Dr. Dwyane Dee in [place] for diabetes management but he recently changed to Dr. [name] and has had one office visit with Dr. [name] since changing doctors.  Patient reports that he checks his blood glucose twice a day (once in the morning and once a bedtime) and that his blood glucose is usually in the 200's mg/dl range.     In discussing potential cause for presenting blood glucose of 635 mg/dl on 7/20, patient reports that he does not know why his blood sugar was so high.  He reports that he took his Levemir as he had been doing (70 units in am and 60 units in pm) and has not skipped any doses of Levemir.  New A1C has been drawn but is currently pending.  Encouraged patient to discuss diabetes management with Dr. [name] so a plan could be formed to improve glycemic control and prevent long-term complications of uncontrolled diabetes.    Patient was initially placed on an insulin drip and was transitioned to SQ insulin this morning.  He received Levemir 60 units at 6:09am and was given SQ Novolog 3 units correction for CBG of 164 mg/dl at 8:18 am.  Noted that patient's blood glucose was 65 mg/dl @ 11:26, 58 mg/dl at 11:42, and 94 @ 12:25.  In talking with the patient, he reports that he was unaware of low blood glucose as he could not feel the low and did not have any symptoms of hypoglycemia that he can remember.  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  Please consider decreasing Novolog correction to sensitive  scale to prevent further episodes of hypoglycemia.  Will continue to follow as an inpatient.  Thanks, Barnie Alderman, RN, MSN, CCRN Diabetes Coordinator Inpatient Diabetes Program [phone]

## 2013-04-09 NOTE — Progress Notes (Signed)
[id number] 

## 2013-04-09 NOTE — Progress Notes (Signed)
Noted glucose on BMET to be 50. Went into check on patient. Patient denied symptoms of hypoglycemia. A&O x4. CBG checked and result was 28. One amp of D50 (25 grams) given IV push. No complaints/ swelling noted to site. Flushed well will continue to monitor closely.

## 2013-04-10 LAB — BASIC METABOLIC PANEL
CO2: 29 mEq/L (ref 19–32)
Calcium: 9.5 mg/dL (ref 8.4–10.5)
GFR calc Af Amer: >90 mL/min (ref >90)
GFR calc non Af Amer: >90 mL/min (ref >90)
Sodium: 134 mEq/L — ABNORMAL LOW (ref 135–145)

## 2013-04-10 LAB — GLUCOSE, CAPILLARY
Glucose-Capillary: 88 mg/dL (ref 70–99)
Glucose-Capillary: 198 mg/dL — ABNORMAL HIGH (ref 70–99)
Glucose-Capillary: 239 mg/dL — ABNORMAL HIGH (ref 70–99)

## 2013-04-10 MED ORDER — PANTOPRAZOLE SODIUM 40 MG PO TBEC: 40.0000 mg | DELAYED_RELEASE_TABLET | Freq: Every day | ORAL | Status: DC

## 2013-04-10 MED ORDER — POTASSIUM CHLORIDE CRYS ER 20 MEQ PO TBCR
20.0000 meq | EXTENDED_RELEASE_TABLET | Freq: Every day | ORAL | Status: DC
  Filled 2013-04-10: qty 1

## 2013-04-10 MED ORDER — INSULIN ASPART 100 UNIT/ML ~~LOC~~ SOLN: 0.0000 [IU] | Freq: Every day | SUBCUTANEOUS | Status: DC

## 2013-04-10 MED ORDER — PROMETHAZINE HCL 25 MG/ML IJ SOLN: 12.5000 mg | INTRAMUSCULAR | Status: DC | PRN

## 2013-04-10 MED ORDER — LISINOPRIL 10 MG PO TABS: 10.0000 mg | ORAL_TABLET | Freq: Every day | ORAL | Status: DC

## 2013-04-10 MED ORDER — INSULIN ASPART 100 UNIT/ML ~~LOC~~ SOLN: 0.0000 [IU] | Freq: Three times a day (TID) | SUBCUTANEOUS | Status: DC

## 2013-04-10 NOTE — Progress Notes (Signed)
Inpatient Diabetes Program Recommendations  AACE/ADA: New Consensus Statement on Inpatient Glycemic Control (2013)  Target Ranges:  Prepandial:   less than 140 mg/dL      Peak postprandial:   less than 180 mg/dL (1-2 hours)      Critically ill patients:  140 - 180 mg/dL   Results for Nathaniel [name], Nathaniel [name] (MRN [id number]) as of 04/10/2013 09:01  Ref. Range 04/09/2013 05:50 04/09/2013 07:17 04/09/2013 08:07 04/09/2013 11:26 04/09/2013 11:42 04/09/2013 12:25 04/09/2013 15:32 04/09/2013 16:07 04/09/2013 18:48 04/09/2013 19:06 04/09/2013 20:47  Glucose-Capillary Latest Range: 70-99 mg/dL 253 (H) 218 (H) 164 (H) 65 (L) 58 (L) 94 28 (LL) 157 (H) 52 (L) 71 81  Results for Nathaniel [name], Nathaniel [name] (MRN [id number]) as of 04/10/2013 09:01  Ref. Range 04/10/2013 00:22 04/10/2013 01:01 04/10/2013 04:08 04/10/2013 07:38  Glucose-Capillary Latest Range: 70-99 mg/dL 54 (L) 88 198 (H) 239 (H)    Inpatient Diabetes Program Recommendations Insulin - Basal: Please consider decreasing Levemir to one daily dose.  Recommend starting with Levemir 40 units daily. Correction (SSI): Please decrease Novolog correction to sensitive scale.  Note: Patient was initially on an insulin drip on 04/08/13 and was transitioned to SQ insulin on 7/21 at which time he was given Levemir 60 units at 6:09 am.  Since receiving the Levemir, patient has had 4 episodes of hypoglycemia which required treatment.  Patient was not given pm dose of Levemir on 7/21.  Therefore, the Levemir 60 units appears to be too much basal.  Please decrease Levemir to one daily dose; recommend starting with Levemir 40 units daily (pts weight 69 kg x 0.6 = 41.4 units for recommend total daily dose of basal).  Also, patient has type 1 diabetes and is sensitive to insulin so please decrease Novolog correction to sensitive scale.  Will continue to follow.  Thanks, Barnie Alderman, RN, MSN, CCRN Diabetes Coordinator Inpatient Diabetes Program [phone]

## 2013-04-10 NOTE — Progress Notes (Signed)
Pt. D/c instructions provided, prescriptions given, IV removed, HTN education given. Insulin orders reviewed as well as new blood pressure medication.

## 2013-04-10 NOTE — Progress Notes (Signed)
Pt blood sugar was 54. He drank an apple juice and will check CBG in 15 min.

## 2013-04-10 NOTE — Discharge Summary (Signed)
[id number] 

## 2013-04-10 NOTE — Discharge Summary (Signed)
NAME:  Nathaniel [name], Nathaniel [name] NO.:  [id number]  MEDICAL RECORD NO.:  [id number]  LOCATION:  A224                          FACILITY:  [facility]  PHYSICIAN:  Unk Lightning, MDDATE OF BIRTH:  [birth year]  DATE OF ADMISSION:  04/08/2013 DATE OF DISCHARGE:  07/22/2014LH                              DISCHARGE SUMMARY   HISTORY OF PRESENT ILLNESS:  The patient is a 27 year old, insulin- dependent diabetic, juvenile type 1, who states he was compliant with his medicines, however came in with nonketotic hyperosmolar state. Blood glucose in excess of 600 with no significant acidosis or anion gap.  He was aggressively treated with Glucommander protocol, aggressive fluid resuscitation as well as IV insulin.  His glucose normalized in a period of 6-8 hours, subsequently placed back on sliding scale NovoLog as well as long-acting Levemir 60-70 units b.i.d., which is his normal regimen.  He complains of some dyspepsia, some diminished appetite. There was no evidence of clinical cholecystitis.  He had some hypokalemia in the hospital which was rectified orally and intravenously as his ability to tolerate p.o. fluids improved.  He was hemodynamically stable throughout his hospital stay.  His ACE inhibitor in the form of lisinopril 10 mg was added, which he was noncompliant with, and he was subsequently discharged on the following medicines; NovoLog sliding scale a.c. t.i.d. 15 units as directed, Levemir insulin 70 units in a.m. and 60 units in p.m. a.c., Protonix 40 mg p.o. daily, Zocor 20 mg p.o. daily, who was told to stop taking his naproxen and to follow up my office in 2-3 days time to check his glycemic control.  His blood pressure as well as any abdominal symptoms, I will have a low threshold to investigate gallbladder if any of this diminished appetite persist.     Unk Lightning, MD     RMD/MEDQ  D:  04/10/2013  T:  04/10/2013  Job:  [id number]

## 2013-04-25 ENCOUNTER — Other Ambulatory Visit: Payer: Self-pay | Admitting: *Deleted

## 2013-04-25 DIAGNOSIS — E876 Hypokalemia: Secondary | ICD-10-CM

## 2013-04-25 DIAGNOSIS — E1010 Type 1 diabetes mellitus with ketoacidosis without coma: Secondary | ICD-10-CM

## 2013-04-27 ENCOUNTER — Other Ambulatory Visit: Payer: Medicare Other

## 2013-05-08 ENCOUNTER — Other Ambulatory Visit: Payer: Self-pay | Admitting: *Deleted

## 2013-05-08 MED ORDER — INSULIN DETEMIR 100 UNIT/ML FLEXPEN: PEN_INJECTOR | SUBCUTANEOUS | Status: DC

## 2013-06-18 ENCOUNTER — Other Ambulatory Visit: Payer: Self-pay | Admitting: Endocrinology

## 2013-06-21 ENCOUNTER — Other Ambulatory Visit: Payer: Self-pay | Admitting: Endocrinology

## 2013-08-20 ENCOUNTER — Other Ambulatory Visit: Payer: Self-pay | Admitting: *Deleted

## 2013-09-28 ENCOUNTER — Encounter (HOSPITAL_COMMUNITY): Payer: Self-pay | Admitting: Emergency Medicine

## 2013-09-28 ENCOUNTER — Emergency Department (HOSPITAL_COMMUNITY)
Admission: EM | Admit: 2013-09-28 | Discharge: 2013-09-28 | Disposition: A | Discharge status: Home / Self Care | Payer: Medicare Other | Attending: Emergency Medicine | Admitting: Emergency Medicine

## 2013-09-28 DIAGNOSIS — R197 Diarrhea, unspecified: Secondary | ICD-10-CM | POA: Insufficient documentation

## 2013-09-28 DIAGNOSIS — E119 Type 2 diabetes mellitus without complications: Secondary | ICD-10-CM

## 2013-09-28 DIAGNOSIS — F17200 Nicotine dependence, unspecified, uncomplicated: Secondary | ICD-10-CM | POA: Insufficient documentation

## 2013-09-28 DIAGNOSIS — E109 Type 1 diabetes mellitus without complications: Secondary | ICD-10-CM | POA: Insufficient documentation

## 2013-09-28 DIAGNOSIS — Z79899 Other long term (current) drug therapy: Secondary | ICD-10-CM | POA: Insufficient documentation

## 2013-09-28 DIAGNOSIS — R112 Nausea with vomiting, unspecified: Secondary | ICD-10-CM | POA: Insufficient documentation

## 2013-09-28 DIAGNOSIS — Z794 Long term (current) use of insulin: Secondary | ICD-10-CM | POA: Insufficient documentation

## 2013-09-28 DIAGNOSIS — F1210 Cannabis abuse, uncomplicated: Secondary | ICD-10-CM | POA: Insufficient documentation

## 2013-09-28 LAB — BASIC METABOLIC PANEL
BUN: 15 mg/dL (ref 6–23)
CHLORIDE: 97 meq/L (ref 96–112)
CO2: 27 meq/L (ref 19–32)
Calcium: 10.6 mg/dL — ABNORMAL HIGH (ref 8.4–10.5)
Creatinine, Ser: 0.92 mg/dL (ref 0.50–1.35)
GFR calc Af Amer: >90 mL/min (ref >90)
GFR calc non Af Amer: >90 mL/min (ref >90)
GLUCOSE: 481 mg/dL — AB (ref 70–99)
POTASSIUM: 4.2 meq/L (ref 3.7–5.3)
SODIUM: 143 meq/L (ref 137–147)

## 2013-09-28 LAB — GLUCOSE, CAPILLARY
Glucose-Capillary: 473 mg/dL — ABNORMAL HIGH (ref 70–99)
Glucose-Capillary: 394 mg/dL — ABNORMAL HIGH (ref 70–99)
Glucose-Capillary: 327 mg/dL — ABNORMAL HIGH (ref 70–99)

## 2013-09-28 LAB — CBC WITH DIFFERENTIAL/PLATELET
Basophils Absolute: 0.1 10*3/uL (ref 0.0–0.1)
Basophils Relative: 1 % (ref 0–1)
Eosinophils Absolute: 0.1 10*3/uL (ref 0.0–0.7)
Eosinophils Relative: 0 % (ref 0–5)
HCT: 41 % (ref 39.0–52.0)
Hemoglobin: 13.9 g/dL (ref 13.0–17.0)
LYMPHS ABS: 1.5 10*3/uL (ref 0.7–4.0)
LYMPHS PCT: 14 % (ref 12–46)
MCH: 29.9 pg (ref 26.0–34.0)
MCHC: 33.9 g/dL (ref 30.0–36.0)
MCV: 88.2 fL (ref 78.0–100.0)
Monocytes Absolute: 0.3 10*3/uL (ref 0.1–1.0)
Monocytes Relative: 2 % — ABNORMAL LOW (ref 3–12)
NEUTROS ABS: 9.3 10*3/uL — AB (ref 1.7–7.7)
NEUTROS PCT: 83 % — AB (ref 43–77)
PLATELETS: 159 10*3/uL (ref 150–400)
RBC: 4.65 MIL/uL (ref 4.22–5.81)
RDW: 13.7 % (ref 11.5–15.5)
WBC: 11.2 10*3/uL — AB (ref 4.0–10.5)

## 2013-09-28 MED ORDER — SODIUM CHLORIDE 0.9 % IV SOLN
Freq: Once | INTRAVENOUS | Status: AC
  Administered 2013-09-28: 14:00:00 via INTRAVENOUS

## 2013-09-28 MED ORDER — ONDANSETRON HCL 8 MG PO TABS: 8.0000 mg | ORAL_TABLET | ORAL | Status: DC | PRN

## 2013-09-28 MED ORDER — SODIUM CHLORIDE 0.9 % IV BOLUS (SEPSIS): 1000.0000 mL | Freq: Once | INTRAVENOUS | Status: DC

## 2013-09-28 MED ORDER — SODIUM CHLORIDE 0.9 % IV BOLUS (SEPSIS)
1000.0000 mL | Freq: Once | INTRAVENOUS | Status: AC
  Administered 2013-09-28: 1000 mL via INTRAVENOUS

## 2013-09-28 MED ORDER — ONDANSETRON HCL 4 MG/2ML IJ SOLN
4.0000 mg | Freq: Once | INTRAMUSCULAR | Status: AC
  Administered 2013-09-28: 4 mg via INTRAVENOUS
  Filled 2013-09-28: qty 2

## 2013-09-28 NOTE — ED Notes (Signed)
abd pain, vomiting, onset today,no fever  Diarrhea.

## 2013-09-28 NOTE — ED Notes (Signed)
Pt had large amount of clear emesis in trash can.

## 2013-09-28 NOTE — Discharge Instructions (Signed)
Medication for nausea. Increase fluids. Check your sugars regularly

## 2013-09-28 NOTE — ED Provider Notes (Addendum)
CSN: [id number]     Arrival date & time 09/28/13  1254 History  This chart was scribed for Nathaniel Christen, MD by Nathaniel [name], ED scribe.  This patient was seen in room APA11/[facility] and the patient's care was started at 1:41 PM.   Chief Complaint  Patient presents with  . Abdominal Pain    The history is provided by the patient. No language interpreter was used.    HPI Comments: Nathaniel [name] is a 28 y.o. male with h/o type 1 DM who presents to the Emergency Department complaining of constant, severe abdominal pain that began this morning with associated nausea, vomiting, diarrhea and hyperglycemia.  Pt states he was feeling well before this morning.  He has DM and does not know how his sugars have been recently.  CBG is 473 on arrival.  Severity is moderate. No radiation of pain   Past Medical History  Diagnosis Date  . Diabetes mellitus     lantus/novolog  . Tobacco abuse     5/day  . Marijuana abuse     occaisionally    Past Surgical History  Procedure Laterality Date  . Eye surgery      Family History  Problem Relation Age of Onset  . Diabetes Father   . Hypertension Mother   . Kidney failure Father     last 4 mnths of life    History  Substance Use Topics  . Smoking status: Current Every Day Smoker -- 5.00 packs/day for 8 years    Types: Cigarettes  . Smokeless tobacco: Never Used  . Alcohol Use: No     Review of Systems  All other systems reviewed and are negative.   A complete 10 system review of systems was obtained and all systems are negative except as noted in the HPI and PMH.    Allergies  Orange  Home Medications   Current Outpatient Rx  Name  Route  Sig  Dispense  Refill  . insulin aspart (NOVOLOG) 100 UNIT/ML injection   Subcutaneous   Inject 0-15 Units into the skin 3 (three) times daily with meals.   1 vial   12   . insulin detemir (LEVEMIR) 100 unit/ml SOLN   Subcutaneous   Inject 60-70 Units into the skin 2 (two) times daily.  70 units in the morning and 60 in the evening.         Marland Kitchen lisinopril (PRINIVIL,ZESTRIL) 10 MG tablet   Oral   Take 1 tablet (10 mg total) by mouth daily.   30 tablet   5   . simvastatin (ZOCOR) 20 MG tablet   Oral   Take 20 mg by mouth every evening.         . ondansetron (ZOFRAN) 8 MG tablet   Oral   Take 1 tablet (8 mg total) by mouth every 4 (four) hours as needed for nausea or vomiting.   10 tablet   1    BP 160/106  Pulse 103  Temp(Src) 97.5 F (36.4 C) (Oral)  Resp 18  Ht 6\' 1"  (1.854 m)  Wt 152 lb (68.947 kg)  BMI 20.06 kg/m2  SpO2 100%  Physical Exam  Nursing note and vitals reviewed. Constitutional: He is oriented to person, place, and time. He appears well-developed and well-nourished.  Slightly dehydrated-appearing  HENT:  Head: Normocephalic and atraumatic.  Mouth/Throat: Mucous membranes are dry (slightly).  Eyes: Conjunctivae and EOM are normal. Pupils are equal, round, and reactive to light.  Neck: Normal range  of motion. Neck supple.  Cardiovascular: Normal rate, regular rhythm and normal heart sounds.   Pulmonary/Chest: Effort normal and breath sounds normal.  Abdominal: Soft. Bowel sounds are normal. There is no tenderness.  Musculoskeletal: Normal range of motion.  Neurological: He is alert and oriented to person, place, and time.  Skin: Skin is warm and dry.  Psychiatric: He has a normal mood and affect. His behavior is normal.    ED Course  Procedures (including critical care time)  DIAGNOSTIC STUDIES: Oxygen Saturation is 100% on room air, normal by my interpretation.    COORDINATION OF CARE: 1:43 PM-Discussed treatment plan which includes IV fluids and labs with pt at bedside and pt agreed to plan.    Labs Review Labs Reviewed  GLUCOSE, CAPILLARY - Abnormal; Notable for the following:    Glucose-Capillary 473 (*)    All other components within normal limits  CBC WITH DIFFERENTIAL - Abnormal; Notable for the following:    WBC  11.2 (*)    Neutrophils Relative % 83 (*)    Neutro Abs 9.3 (*)    Monocytes Relative 2 (*)    All other components within normal limits  BASIC METABOLIC PANEL - Abnormal; Notable for the following:    Glucose, Bld 481 (*)    Calcium 10.6 (*)    All other components within normal limits  GLUCOSE, CAPILLARY - Abnormal; Notable for the following:    Glucose-Capillary 394 (*)    All other components within normal limits  GLUCOSE, CAPILLARY - Abnormal; Notable for the following:    Glucose-Capillary 327 (*)    All other components within normal limits  URINALYSIS, ROUTINE W REFLEX MICROSCOPIC    Imaging Review No results found.   EKG Interpretation   None       MDM   1. Diabetes mellitus   2. Nausea and vomiting    Patient feels better after 2 liters of IVF's.     He is not in DKA.  No acute abdomen.   Discharge medications Zofran 8 mg.    I personally performed the services described in this documentation, which was scribed in my presence. The recorded information has been reviewed and is accurate.    Nathaniel Christen, MD 09/28/13 [place], MD 09/29/13 2138

## 2014-01-09 ENCOUNTER — Encounter (HOSPITAL_COMMUNITY): Payer: Self-pay | Admitting: Emergency Medicine

## 2014-01-09 ENCOUNTER — Emergency Department (HOSPITAL_COMMUNITY): Payer: Medicare Other

## 2014-01-09 ENCOUNTER — Emergency Department (HOSPITAL_COMMUNITY)
Admission: EM | Admit: 2014-01-09 | Discharge: 2014-01-09 | Disposition: A | Discharge status: Home / Self Care | Payer: Medicare Other | Attending: Emergency Medicine | Admitting: Emergency Medicine

## 2014-01-09 DIAGNOSIS — Z794 Long term (current) use of insulin: Secondary | ICD-10-CM | POA: Insufficient documentation

## 2014-01-09 DIAGNOSIS — Z79899 Other long term (current) drug therapy: Secondary | ICD-10-CM | POA: Insufficient documentation

## 2014-01-09 DIAGNOSIS — R32 Unspecified urinary incontinence: Secondary | ICD-10-CM | POA: Insufficient documentation

## 2014-01-09 DIAGNOSIS — R259 Unspecified abnormal involuntary movements: Secondary | ICD-10-CM | POA: Insufficient documentation

## 2014-01-09 DIAGNOSIS — G529 Cranial nerve disorder, unspecified: Secondary | ICD-10-CM | POA: Insufficient documentation

## 2014-01-09 DIAGNOSIS — Z8659 Personal history of other mental and behavioral disorders: Secondary | ICD-10-CM | POA: Insufficient documentation

## 2014-01-09 DIAGNOSIS — E161 Other hypoglycemia: Secondary | ICD-10-CM

## 2014-01-09 DIAGNOSIS — F17200 Nicotine dependence, unspecified, uncomplicated: Secondary | ICD-10-CM | POA: Insufficient documentation

## 2014-01-09 DIAGNOSIS — E1169 Type 2 diabetes mellitus with other specified complication: Secondary | ICD-10-CM | POA: Insufficient documentation

## 2014-01-09 LAB — COMPREHENSIVE METABOLIC PANEL
ALT: 14 U/L (ref 0–53)
AST: 19 U/L (ref 0–37)
Albumin: 3.4 g/dL — ABNORMAL LOW (ref 3.5–5.2)
Alkaline Phosphatase: 72 U/L (ref 39–117)
BUN: 13 mg/dL (ref 6–23)
CHLORIDE: 104 meq/L (ref 96–112)
CO2: 27 meq/L (ref 19–32)
Calcium: 9.6 mg/dL (ref 8.4–10.5)
Creatinine, Ser: 0.84 mg/dL (ref 0.50–1.35)
GFR calc Af Amer: >90 mL/min (ref >90)
GFR calc non Af Amer: >90 mL/min (ref >90)
Glucose, Bld: 57 mg/dL — ABNORMAL LOW (ref 70–99)
POTASSIUM: 3.9 meq/L (ref 3.7–5.3)
Sodium: 143 mEq/L (ref 137–147)
Total Bilirubin: 0.3 mg/dL (ref 0.3–1.2)
Total Protein: 7.1 g/dL (ref 6.0–8.3)

## 2014-01-09 LAB — CBG MONITORING, ED
GLUCOSE-CAPILLARY: 97 mg/dL (ref 70–99)
GLUCOSE-CAPILLARY: 127 mg/dL — AB (ref 70–99)
Glucose-Capillary: 77 mg/dL (ref 70–99)
Glucose-Capillary: 70 mg/dL (ref 70–99)

## 2014-01-09 LAB — DIFFERENTIAL
BASOS ABS: 0.1 10*3/uL (ref 0.0–0.1)
Basophils Relative: 1 % (ref 0–1)
Eosinophils Absolute: 0.1 10*3/uL (ref 0.0–0.7)
Eosinophils Relative: 1 % (ref 0–5)
LYMPHS PCT: 29 % (ref 12–46)
Lymphs Abs: 2.7 10*3/uL (ref 0.7–4.0)
MONO ABS: 0.4 10*3/uL (ref 0.1–1.0)
Monocytes Relative: 4 % (ref 3–12)
NEUTROS ABS: 6.2 10*3/uL (ref 1.7–7.7)
Neutrophils Relative %: 65 % (ref 43–77)

## 2014-01-09 LAB — APTT: aPTT: 29 seconds (ref 24–37)

## 2014-01-09 LAB — PROTIME-INR
INR: 0.96 (ref 0.00–1.49)
Prothrombin Time: 12.6 seconds (ref 11.6–15.2)

## 2014-01-09 LAB — CBC
HEMATOCRIT: 37.6 % — AB (ref 39.0–52.0)
Hemoglobin: 12.8 g/dL — ABNORMAL LOW (ref 13.0–17.0)
MCH: 30.5 pg (ref 26.0–34.0)
MCHC: 34 g/dL (ref 30.0–36.0)
MCV: 89.5 fL (ref 78.0–100.0)
Platelets: 227 10*3/uL (ref 150–400)
RBC: 4.2 MIL/uL — ABNORMAL LOW (ref 4.22–5.81)
RDW: 14.6 % (ref 11.5–15.5)
WBC: 9.4 10*3/uL (ref 4.0–10.5)

## 2014-01-09 LAB — D-DIMER, QUANTITATIVE: D-Dimer, Quant: 1.99 ug/mL-FEU — ABNORMAL HIGH (ref 0.00–0.48)

## 2014-01-09 LAB — ETHANOL: Alcohol, Ethyl (B): <11 mg/dL (ref 0–11)

## 2014-01-09 MED ORDER — DEXTROSE 50 % IV SOLN: 1.0000 | Freq: Once | INTRAVENOUS | Status: DC

## 2014-01-09 MED ORDER — IOHEXOL 350 MG/ML SOLN
100.0000 mL | Freq: Once | INTRAVENOUS | Status: AC | PRN
  Administered 2014-01-09: 100 mL via INTRAVENOUS

## 2014-01-09 MED ORDER — SODIUM CHLORIDE 0.9 % IV BOLUS (SEPSIS)
1000.0000 mL | Freq: Once | INTRAVENOUS | Status: AC
  Administered 2014-01-09: 1000 mL via INTRAVENOUS

## 2014-01-09 NOTE — Discharge Instructions (Signed)
Hypoglycemia (Low Blood Sugar) Make sure you eat after taking your insulin. Follow up with Dr. [name] this week. Return to the ED if you develop new or worsening symptoms. Hypoglycemia is when the glucose (sugar) in your blood is too low. Hypoglycemia can happen for many reasons. It can happen to people with or without diabetes. Hypoglycemia can develop quickly and can be a medical emergency.  CAUSES  Having hypoglycemia does not mean that you will develop diabetes. Different causes include:  Missed or delayed meals or not enough carbohydrates eaten.  Medication overdose. This could be by accident or deliberate. If by accident, your medication may need to be adjusted or changed.  Exercise or increased activity without adjustments in carbohydrates or medications.  A nerve disorder that affects body functions like your heart rate, blood pressure and digestion (autonomic neuropathy).  A condition where the stomach muscles do not function properly (gastroparesis). Therefore, medications may not absorb properly.  The inability to recognize the signs of hypoglycemia (hypoglycemic unawareness).  Absorption of insulin  may be altered.  Alcohol consumption.  Pregnancy/menstrual cycles/postpartum. This may be due to hormones.  Certain kinds of tumors. This is very rare. SYMPTOMS   Sweating.  Hunger.  Dizziness.  Blurred vision.  Drowsiness.  Weakness.  Headache.  Rapid heart beat.  Shakiness.  Nervousness. DIAGNOSIS  Diagnosis is made by monitoring blood glucose in one or all of the following ways:  Fingerstick blood glucose monitoring.  Laboratory results. TREATMENT  If you think your blood glucose is low:  Check your blood glucose, if possible. If it is less than 70 mg/dl, take one of the following:  3-4 glucose tablets.   cup juice (prefer clear like apple).   cup "regular" soda pop.  1 cup milk.  -1 tube of glucose gel.  5-6 hard candies.  Do not  over treat because your blood glucose (sugar) will only go too high.  Wait 15 minutes and recheck your blood glucose. If it is still less than 70 mg/dl (or below your target range), repeat treatment.  Eat a snack if it is more than one hour until your next meal. Sometimes, your blood glucose may go so low that you are unable to treat yourself. You may need someone to help you. You may even pass out or be unable to swallow. This may require you to get an injection of glucagon, which raises the blood glucose. HOME CARE INSTRUCTIONS  Check blood glucose as recommended by your caregiver.  Take medication as prescribed by your caregiver.  Follow your meal plan. Do not skip meals. Eat on time.  If you are going to drink alcohol, drink it only with meals.  Check your blood glucose before driving.  Check your blood glucose before and after exercise. If you exercise longer or different than usual, be sure to check blood glucose more frequently.  Always carry treatment with you. Glucose tablets are the easiest to carry.  Always wear medical alert jewelry or carry some form of identification that states that you have diabetes. This will alert people that you have diabetes. If you have hypoglycemia, they will have a better idea on what to do. SEEK MEDICAL CARE IF:   You are having problems keeping your blood sugar at target range.  You are having frequent episodes of hypoglycemia.  You feel you might be having side effects from your medicines.  You have symptoms of an illness that is not improving after 3-4 days.  You notice a  change in vision or a new problem with your vision. SEEK IMMEDIATE MEDICAL CARE IF:   You are a family member or friend of a person whose blood glucose goes below 70 mg/dl and is accompanied by:  Confusion.  A change in mental status.  The inability to swallow.  Passing out. Document Released: 09/06/2005 Document Revised: 11/29/2011 Document Reviewed:  01/03/2012 [facility] Patient Information 2014 [place], [place].

## 2014-01-09 NOTE — ED Notes (Signed)
Pt refusing peanut butter and crackers and states is allergic to orange juice.  Gingerale given to pt and meal tray ordered stat.

## 2014-01-09 NOTE — ED Provider Notes (Signed)
CSN: [id number]     Arrival date & time 01/09/14  1454 History   First MD Initiated Contact with Patient 01/09/14 1505     Chief Complaint  Patient presents with  . Loss of Consciousness     (Consider location/radiation/quality/duration/timing/severity/associated sxs/prior Treatment) HPI Comments: Patient presents with right arm weakness that he noticed when he woke up today around 12:30 PM. He tried to get out of bed and fell because of weakness on his right side. Last seen normal his last night. He reports intermittent episodes of "shaking all over that been going on for the past several months he had some his episodes yesterday and today. They're associated with urinary incontinence but he is awake and aware of these episodes. He did not check his blood sugar at the time. Blood sugar today is 77. He denies losing consciousness. No chest pain cough back pain, abdominal pain, nausea or vomiting. He reports he had difficulty speaking earlier which is resolving quickly. He was also sweaty and felt that he couldn't get his words out. He told his PCP about these episodes but did not receive any information. No previous history of seizures. No tongue biting.  The history is provided by the patient.    Past Medical History  Diagnosis Date  . Diabetes mellitus     lantus/novolog  . Tobacco abuse     5/day  . Marijuana abuse     occaisionally   Past Surgical History  Procedure Laterality Date  . Eye surgery     Family History  Problem Relation Age of Onset  . Diabetes Father   . Hypertension Mother   . Kidney failure Father     last 4 mnths of life   History  Substance Use Topics  . Smoking status: Current Every Day Smoker -- 5.00 packs/day for 8 years    Types: Cigarettes  . Smokeless tobacco: Never Used  . Alcohol Use: No    Review of Systems  Constitutional: Positive for diaphoresis and activity change. Negative for fever and fatigue.  Respiratory: Negative for cough, chest  tightness and shortness of breath.   Cardiovascular: Positive for syncope. Negative for chest pain.  Gastrointestinal: Negative for nausea, vomiting and abdominal pain.  Genitourinary: Negative for dysuria and hematuria.  Musculoskeletal: Negative for arthralgias, myalgias and neck pain.  Neurological: Positive for dizziness, syncope, speech difficulty, weakness and light-headedness. Negative for seizures and headaches.  A complete 10 system review of systems was obtained and all systems are negative except as noted in the HPI and PMH.      Allergies  Orange  Home Medications   Prior to Admission medications   Medication Sig Start Date End Date Taking? Authorizing Provider  insulin aspart (NOVOLOG) 100 UNIT/ML injection Inject 0-15 Units into the skin 3 (three) times daily with meals. 04/10/13   Maricela Curet, MD  insulin detemir (LEVEMIR) 100 unit/ml SOLN Inject 60-70 Units into the skin 2 (two) times daily. 70 units in the morning and 60 in the evening.    Historical Provider, MD  lisinopril (PRINIVIL,ZESTRIL) 10 MG tablet Take 1 tablet (10 mg total) by mouth daily. 04/10/13   Maricela Curet, MD  ondansetron (ZOFRAN) 8 MG tablet Take 1 tablet (8 mg total) by mouth every 4 (four) hours as needed for nausea or vomiting. 09/28/13   [name], MD  simvastatin (ZOCOR) 20 MG tablet Take 20 mg by mouth every evening.    Historical Provider, MD   BP 150/93  Pulse 92  Temp(Src) 97.9 F (36.6 C) (Oral)  Resp 18  SpO2 100% Physical Exam  Constitutional: He is oriented to person, place, and time. He appears well-developed and well-nourished. No distress.  HENT:  Head: Normocephalic and atraumatic.  Mouth/Throat: Oropharynx is clear and moist. No oropharyngeal exudate.  Eyes: Conjunctivae and EOM are normal. Pupils are equal, round, and reactive to light.  Neck: Normal range of motion. Neck supple.  Cardiovascular: Normal rate, regular rhythm and normal heart sounds.   No murmur  heard. Pulmonary/Chest: Effort normal and breath sounds normal. No respiratory distress.  Abdominal: Soft. There is no tenderness. There is no rebound and no guarding.  Musculoskeletal: Normal range of motion. He exhibits no edema and no tenderness.  Neurological: He is alert and oriented to person, place, and time. A cranial nerve deficit is present. He exhibits normal muscle tone. Coordination normal.  Nasolabial fold flattening on the right. Symmetric smile with effort. 4-5 strength right upper extremity and right grip strength. 5 Out of 5 strength right leg and left leg no pronator drift   Skin: Skin is warm.    ED Course  Procedures (including critical care time) Labs Review Labs Reviewed  CBC - Abnormal; Notable for the following:    RBC 4.20 (*)    Hemoglobin 12.8 (*)    HCT 37.6 (*)    All other components within normal limits  COMPREHENSIVE METABOLIC PANEL - Abnormal; Notable for the following:    Glucose, Bld 57 (*)    Albumin 3.4 (*)    All other components within normal limits  D-DIMER, QUANTITATIVE - Abnormal; Notable for the following:    D-Dimer, Quant 1.99 (*)    All other components within normal limits  CBG MONITORING, ED - Abnormal; Notable for the following:    Glucose-Capillary 127 (*)    All other components within normal limits  ETHANOL  PROTIME-INR  APTT  DIFFERENTIAL  CBG MONITORING, ED  CBG MONITORING, ED  CBG MONITORING, ED    Imaging Review Ct Head Wo Contrast  01/09/2014   CLINICAL DATA:  Loss of consciousness.  Syncopal episode today.  EXAM: CT HEAD WITHOUT CONTRAST  TECHNIQUE: Contiguous axial images were obtained from the base of the skull through the vertex without intravenous contrast.  COMPARISON:  None.  FINDINGS: Ventricles and sulci are within normal limits. There is no evidence of acute cortical infarct, intracranial hemorrhage, mass, midline shift, or extra-axial fluid collection. Prior bilateral lens extraction is noted. There is a  left mastoid effusion. Paranasal sinuses are clear. No skull fracture is seen.  IMPRESSION: 1. Unremarkable appearance of the brain. 2. Left mastoid effusion.   Electronically Signed   By: Logan Bores   On: 01/09/2014 15:45   Mr Brain Wo Contrast  01/09/2014   CLINICAL DATA:  Syncope  EXAM: MRI HEAD WITHOUT CONTRAST  TECHNIQUE: Multiplanar, multiecho pulse sequences of the brain and surrounding structures were obtained without intravenous contrast.  COMPARISON:  CT head 01/09/2014  FINDINGS: Ventricle size is normal. Slightly low-lying cerebellar tonsils without Chiari malformation. Pituitary normal in size.  Negative for acute or chronic infarction. Negative for demyelinating disease. Cerebral white matter is normal. Brainstem and cerebellum are normal.  Negative for intracranial hemorrhage or mass.  Detailed images through the temporal lobes show normal signal and anatomy in the hippocampus and temporal lobe bilaterally.  Mild mucosal thickening in the paranasal sinuses. Prominent adenoid lymphoid tissue.  IMPRESSION: Normal MRI of the brain.  Mild sinusitis.  Electronically Signed   By: Franchot Gallo M.D.   On: 01/09/2014 19:06   Ct Angio Chest Aorta W/cm &/or Wo/cm  01/09/2014   CLINICAL DATA:  Syncope, right-sided abnormal sensation. Elevated D-dimer.  EXAM: CT ANGIOGRAPHY CHEST, ABDOMEN AND PELVIS  TECHNIQUE: Multidetector CT imaging through the chest, abdomen and pelvis was performed using the standard protocol during bolus administration of intravenous contrast. Multiplanar reconstructed images and MIPs were obtained and reviewed to evaluate the vascular anatomy.  CONTRAST:  126mL OMNIPAQUE IOHEXOL 350 MG/ML SOLN  COMPARISON:  12/10/2010  FINDINGS: CTA CHEST FINDINGS  The noncontrast scout shows no hyperdense crescent or mediastinal hematoma. No pneumothorax.  On CTA, Satisfactory opacification of pulmonary arteries noted, and there is no evidence of pulmonary emboli. Adequate contrast opacification  of the thoracic aorta with no evidence of dissection, aneurysm, or stenosis. There is bovine variant brachiocephalic arch anatomy without proximal stenosis.  No pleural or pericardial effusion. No hilar or mediastinal adenopathy. Lungs are clear.  Review of the MIP images confirms the above findings.  CTA ABDOMEN AND PELVIS FINDINGS  Arterial findings:  Aorta:               No dissection, aneurysm, or stenosis.  Celiac axis:         Widely patent  Superior mesenteric: Widely patent.  Classic distal branching.  Left renal:          Single, patent.  Right renal:         Single, patent.  Inferior mesenteric: Patent.  Left iliac:          Unremarkable  Right iliac:         Unremarkable  Venous findings: Dedicated venous phase imaging not obtained. Retroaortic left renal vein, an anatomic variant. Patent portal vein, splenic vein, bilateral renal veins.  Review of the MIP images confirms the above findings.  Nonvascular findings: Unremarkable arterial phase evaluation of liver, nondilated gallbladder, spleen, adrenal glands, kidneys, pancreas. Stomach, small bowel, and colon are nondilated. Urinary bladder physiologically distended. No free air. No adenopathy localized. No ascites.  IMPRESSION: 1. Negative for aortic dissection, pulmonary embolism, or other acute abnormality.   Electronically Signed   By: Arne Cleveland M.D.   On: 01/09/2014 17:43   Ct Angio Abd/pel W/ And/or W/o  01/09/2014   CLINICAL DATA:  Syncope, right-sided abnormal sensation. Elevated D-dimer.  EXAM: CT ANGIOGRAPHY CHEST, ABDOMEN AND PELVIS  TECHNIQUE: Multidetector CT imaging through the chest, abdomen and pelvis was performed using the standard protocol during bolus administration of intravenous contrast. Multiplanar reconstructed images and MIPs were obtained and reviewed to evaluate the vascular anatomy.  CONTRAST:  160mL OMNIPAQUE IOHEXOL 350 MG/ML SOLN  COMPARISON:  12/10/2010  FINDINGS: CTA CHEST FINDINGS  The noncontrast scout shows no  hyperdense crescent or mediastinal hematoma. No pneumothorax.  On CTA, Satisfactory opacification of pulmonary arteries noted, and there is no evidence of pulmonary emboli. Adequate contrast opacification of the thoracic aorta with no evidence of dissection, aneurysm, or stenosis. There is bovine variant brachiocephalic arch anatomy without proximal stenosis.  No pleural or pericardial effusion. No hilar or mediastinal adenopathy. Lungs are clear.  Review of the MIP images confirms the above findings.  CTA ABDOMEN AND PELVIS FINDINGS  Arterial findings:  Aorta:               No dissection, aneurysm, or stenosis.  Celiac axis:         Widely patent  Superior mesenteric: Widely patent.  Classic distal  branching.  Left renal:          Single, patent.  Right renal:         Single, patent.  Inferior mesenteric: Patent.  Left iliac:          Unremarkable  Right iliac:         Unremarkable  Venous findings: Dedicated venous phase imaging not obtained. Retroaortic left renal vein, an anatomic variant. Patent portal vein, splenic vein, bilateral renal veins.  Review of the MIP images confirms the above findings.  Nonvascular findings: Unremarkable arterial phase evaluation of liver, nondilated gallbladder, spleen, adrenal glands, kidneys, pancreas. Stomach, small bowel, and colon are nondilated. Urinary bladder physiologically distended. No free air. No adenopathy localized. No ascites.  IMPRESSION: 1. Negative for aortic dissection, pulmonary embolism, or other acute abnormality.   Electronically Signed   By: Arne Cleveland M.D.   On: 01/09/2014 17:43     EKG Interpretation   Date/Time:  Wednesday January 09 2014 15:10:27 EDT Ventricular Rate:  92 PR Interval:  144 QRS Duration: 94 QT Interval:  332 QTC Calculation: 410 R Axis:   68 Text Interpretation:  Normal sinus rhythm Left ventricular hypertrophy ST  elevation, consider early repolarization, pericarditis, or injury Abnormal  ECG When compared with ECG of  08-Apr-2013 15:53, Confirmed by Wyvonnia Dusky  MD,  Kimani Hovis [phone]) on 01/09/2014 3:27:46 PM      MDM   Final diagnoses:  Hypoglycemic reaction   Patient with right-sided weakness in his arm and face. Multiple episodes of shaking all over suspicious for possible seizures associated with diffuse shaking. However patient awake and aware of these episodes when they occur.  Question hypoglycemia.  Code stroke not activated as patient's last seen normal his last night. CT head negative. CBG 77  EKG with ST elevations consistent with LVH and early repolarization similar to previous. Discussed with Dr. Claiborne Billings who agrees not representative of STEMI. No brugada, no prolonged QT.  CT head negative. Neurologist on call Dr. Fredirick Lathe has seen patient.  she feels he is having typical hypoglycemic reactions with whole-body tremors and remains totally aware. Not seizures. Consider possible drug interaction with statin. She is not feel he is having seizures. She feels he is having an insulin reaction or reaction to hypoglycemia. His right arm weakness has resolved. His facial droop has resolved. She does not think he needs an MRI.  CTA negative for dissection or pulmonary embolism.  MRI negative.  Patient tolerating PO in the ED and symptoms have resolved.  Blood sugar is improved to 127 after eating. Patient is asymptomatic and wishing to go home. Advised to discontinue his statin use and followup with PCP. Return precautions discussed.  BP 150/93  Pulse 92  Temp(Src) 97.9 F (36.6 C) (Oral)  Resp 18  SpO2 100%   Ezequiel Essex, MD 01/10/14 [phone]

## 2014-01-09 NOTE — ED Notes (Signed)
Patient ambulated to bathroom with no difficulty or assistance. Gait steady. No complaints of dizziness or weakness at this time.

## 2014-01-09 NOTE — ED Notes (Signed)
Patient to CT.

## 2014-01-09 NOTE — ED Notes (Signed)
Patient remains in MRI 

## 2014-01-09 NOTE — ED Notes (Signed)
Patient CBG 70 at this time. Giving patient meal tray as requested.

## 2014-01-09 NOTE — ED Notes (Signed)
Pt reports syncopal episode that occurred earlier today. Pt states "when I woke up from it I was covered in sweat and now I can't talk right". Pt reports "weird feeling" on his right side.

## 2014-01-09 NOTE — ED Notes (Signed)
MD states PO intake to increase CBG versus IV. Patient given ginger ale and meal tray ordered.

## 2014-06-21 ENCOUNTER — Encounter (HOSPITAL_COMMUNITY): Payer: Self-pay | Admitting: Emergency Medicine

## 2014-06-21 ENCOUNTER — Inpatient Hospital Stay (HOSPITAL_COMMUNITY)
Admission: EM | Admit: 2014-06-21 | Discharge: 2014-06-24 | DRG: 603 | Disposition: A | Discharge status: Home / Self Care | Payer: Medicare Other | Attending: Family Medicine | Admitting: Family Medicine

## 2014-06-21 ENCOUNTER — Emergency Department (HOSPITAL_COMMUNITY): Payer: Medicare Other

## 2014-06-21 DIAGNOSIS — E876 Hypokalemia: Secondary | ICD-10-CM | POA: Diagnosis present

## 2014-06-21 DIAGNOSIS — F1210 Cannabis abuse, uncomplicated: Secondary | ICD-10-CM | POA: Diagnosis present

## 2014-06-21 DIAGNOSIS — I10 Essential (primary) hypertension: Secondary | ICD-10-CM | POA: Diagnosis present

## 2014-06-21 DIAGNOSIS — L0231 Cutaneous abscess of buttock: Secondary | ICD-10-CM | POA: Diagnosis present

## 2014-06-21 DIAGNOSIS — F17210 Nicotine dependence, cigarettes, uncomplicated: Secondary | ICD-10-CM | POA: Diagnosis present

## 2014-06-21 DIAGNOSIS — E10649 Type 1 diabetes mellitus with hypoglycemia without coma: Secondary | ICD-10-CM | POA: Diagnosis present

## 2014-06-21 DIAGNOSIS — Z8249 Family history of ischemic heart disease and other diseases of the circulatory system: Secondary | ICD-10-CM | POA: Diagnosis not present

## 2014-06-21 DIAGNOSIS — Z833 Family history of diabetes mellitus: Secondary | ICD-10-CM

## 2014-06-21 DIAGNOSIS — L0390 Cellulitis, unspecified: Secondary | ICD-10-CM

## 2014-06-21 DIAGNOSIS — R739 Hyperglycemia, unspecified: Secondary | ICD-10-CM

## 2014-06-21 DIAGNOSIS — E1065 Type 1 diabetes mellitus with hyperglycemia: Secondary | ICD-10-CM

## 2014-06-21 DIAGNOSIS — Z794 Long term (current) use of insulin: Secondary | ICD-10-CM

## 2014-06-21 DIAGNOSIS — Z720 Tobacco use: Secondary | ICD-10-CM

## 2014-06-21 DIAGNOSIS — E785 Hyperlipidemia, unspecified: Secondary | ICD-10-CM | POA: Diagnosis present

## 2014-06-21 DIAGNOSIS — L0291 Cutaneous abscess, unspecified: Secondary | ICD-10-CM

## 2014-06-21 LAB — CBC WITH DIFFERENTIAL/PLATELET
BASOS ABS: 0.1 10*3/uL (ref 0.0–0.1)
Basophils Relative: 1 % (ref 0–1)
Eosinophils Absolute: 0.4 10*3/uL (ref 0.0–0.7)
Eosinophils Relative: 3 % (ref 0–5)
HCT: 31.9 % — ABNORMAL LOW (ref 39.0–52.0)
Hemoglobin: 10.8 g/dL — ABNORMAL LOW (ref 13.0–17.0)
LYMPHS PCT: 18 % (ref 12–46)
Lymphs Abs: 2.6 10*3/uL (ref 0.7–4.0)
MCH: 30.3 pg (ref 26.0–34.0)
MCHC: 33.9 g/dL (ref 30.0–36.0)
MCV: 89.6 fL (ref 78.0–100.0)
Monocytes Absolute: 0.8 10*3/uL (ref 0.1–1.0)
Monocytes Relative: 6 % (ref 3–12)
NEUTROS PCT: 72 % (ref 43–77)
Neutro Abs: 10.8 10*3/uL — ABNORMAL HIGH (ref 1.7–7.7)
PLATELETS: 325 10*3/uL (ref 150–400)
RBC: 3.56 MIL/uL — AB (ref 4.22–5.81)
RDW: 13.1 % (ref 11.5–15.5)
WBC: 14.7 10*3/uL — AB (ref 4.0–10.5)

## 2014-06-21 LAB — BLOOD GAS, VENOUS
Acid-Base Excess: 6.7 mmol/L — ABNORMAL HIGH (ref 0.0–2.0)
Bicarbonate: 31.7 mEq/L — ABNORMAL HIGH (ref 20.0–24.0)
Drawn by: 33840
O2 Saturation: 66.5 %
PH VEN: 7.381 — AB (ref 7.250–7.300)
Patient temperature: 37
TCO2: 29.3 mmol/L (ref 0–100)
pCO2, Ven: 54.6 mmHg — ABNORMAL HIGH (ref 45.0–50.0)
pO2, Ven: 37.3 mmHg (ref 30.0–45.0)

## 2014-06-21 LAB — GLUCOSE, CAPILLARY
GLUCOSE-CAPILLARY: 43 mg/dL — AB (ref 70–99)
Glucose-Capillary: 21 mg/dL — CL (ref 70–99)
Glucose-Capillary: 22 mg/dL — CL (ref 70–99)
Glucose-Capillary: 175 mg/dL — ABNORMAL HIGH (ref 70–99)
Glucose-Capillary: 57 mg/dL — ABNORMAL LOW (ref 70–99)
Glucose-Capillary: 162 mg/dL — ABNORMAL HIGH (ref 70–99)
Glucose-Capillary: 59 mg/dL — ABNORMAL LOW (ref 70–99)
Glucose-Capillary: 104 mg/dL — ABNORMAL HIGH (ref 70–99)
Glucose-Capillary: 157 mg/dL — ABNORMAL HIGH (ref 70–99)

## 2014-06-21 LAB — BASIC METABOLIC PANEL
ANION GAP: 10 (ref 5–15)
Anion gap: 13 (ref 5–15)
BUN: 14 mg/dL (ref 6–23)
BUN: 11 mg/dL (ref 6–23)
CO2: 30 meq/L (ref 19–32)
CO2: 29 mEq/L (ref 19–32)
Calcium: 9 mg/dL (ref 8.4–10.5)
Calcium: 9.5 mg/dL (ref 8.4–10.5)
Chloride: 96 mEq/L (ref 96–112)
Chloride: 99 mEq/L (ref 96–112)
Creatinine, Ser: 0.89 mg/dL (ref 0.50–1.35)
Creatinine, Ser: 0.85 mg/dL (ref 0.50–1.35)
GFR calc Af Amer: >90 mL/min (ref >90)
GFR calc Af Amer: >90 mL/min (ref >90)
GFR calc non Af Amer: >90 mL/min (ref >90)
GFR calc non Af Amer: >90 mL/min (ref >90)
Glucose, Bld: 409 mg/dL — ABNORMAL HIGH (ref 70–99)
Glucose, Bld: 53 mg/dL — ABNORMAL LOW (ref 70–99)
Potassium: 4.4 mEq/L (ref 3.7–5.3)
Potassium: 3.2 mEq/L — ABNORMAL LOW (ref 3.7–5.3)
SODIUM: 136 meq/L — AB (ref 137–147)
Sodium: 141 mEq/L (ref 137–147)

## 2014-06-21 LAB — CBG MONITORING, ED
GLUCOSE-CAPILLARY: 173 mg/dL — AB (ref 70–99)
GLUCOSE-CAPILLARY: 318 mg/dL — AB (ref 70–99)
GLUCOSE-CAPILLARY: 477 mg/dL — AB (ref 70–99)

## 2014-06-21 LAB — HEMOGLOBIN A1C
HEMOGLOBIN A1C: 10.2 % — AB (ref <5.7)
Mean Plasma Glucose: 246 mg/dL — ABNORMAL HIGH (ref <117)

## 2014-06-21 MED ORDER — POTASSIUM CHLORIDE CRYS ER 20 MEQ PO TBCR
40.0000 meq | EXTENDED_RELEASE_TABLET | Freq: Two times a day (BID) | ORAL | Status: AC
  Administered 2014-06-21 (×2): 40 meq via ORAL
  Filled 2014-06-21 (×2): qty 2

## 2014-06-21 MED ORDER — INSULIN DETEMIR 100 UNIT/ML ~~LOC~~ SOLN
25.0000 [IU] | Freq: Two times a day (BID) | SUBCUTANEOUS | Status: DC
  Administered 2014-06-21: 25 [IU] via SUBCUTANEOUS
  Filled 2014-06-21 (×7): qty 0.25

## 2014-06-21 MED ORDER — SENNA 8.6 MG PO TABS
1.0000 | ORAL_TABLET | Freq: Two times a day (BID) | ORAL | Status: DC
  Administered 2014-06-22 – 2014-06-24 (×3): 8.6 mg via ORAL
  Filled 2014-06-21 (×9): qty 1

## 2014-06-21 MED ORDER — CLINDAMYCIN PHOSPHATE 600 MG/50ML IV SOLN
600.0000 mg | Freq: Three times a day (TID) | INTRAVENOUS | Status: DC
Start: 2014-06-21 — End: 2014-06-24
  Administered 2014-06-21 – 2014-06-24 (×9): 600 mg via INTRAVENOUS
  Filled 2014-06-21 (×13): qty 50

## 2014-06-21 MED ORDER — ACETAMINOPHEN 325 MG PO TABS: 650.0000 mg | ORAL_TABLET | Freq: Four times a day (QID) | ORAL | Status: DC | PRN

## 2014-06-21 MED ORDER — INSULIN ASPART 100 UNIT/ML ~~LOC~~ SOLN
0.0000 [IU] | Freq: Three times a day (TID) | SUBCUTANEOUS | Status: DC
  Administered 2014-06-21: 4 [IU] via SUBCUTANEOUS
  Administered 2014-06-22 (×2): 3 [IU] via SUBCUTANEOUS
  Administered 2014-06-23 (×2): 4 [IU] via SUBCUTANEOUS
  Administered 2014-06-24: 7 [IU] via SUBCUTANEOUS

## 2014-06-21 MED ORDER — SODIUM CHLORIDE 0.9 % IJ SOLN
INTRAMUSCULAR | Status: AC
  Filled 2014-06-21: qty 350

## 2014-06-21 MED ORDER — ACETAMINOPHEN 650 MG RE SUPP: 650.0000 mg | Freq: Four times a day (QID) | RECTAL | Status: DC | PRN

## 2014-06-21 MED ORDER — INSULIN ASPART 100 UNIT/ML ~~LOC~~ SOLN: 0.0000 [IU] | Freq: Every day | SUBCUTANEOUS | Status: DC

## 2014-06-21 MED ORDER — ONDANSETRON HCL 4 MG PO TABS: 4.0000 mg | ORAL_TABLET | Freq: Four times a day (QID) | ORAL | Status: DC | PRN

## 2014-06-21 MED ORDER — MORPHINE SULFATE 4 MG/ML IJ SOLN
4.0000 mg | INTRAMUSCULAR | Status: DC | PRN
  Administered 2014-06-21 – 2014-06-24 (×7): 4 mg via INTRAVENOUS
  Filled 2014-06-21 (×7): qty 1

## 2014-06-21 MED ORDER — CLINDAMYCIN PHOSPHATE 900 MG/50ML IV SOLN
900.0000 mg | Freq: Once | INTRAVENOUS | Status: AC
  Administered 2014-06-21: 900 mg via INTRAVENOUS
  Filled 2014-06-21: qty 50

## 2014-06-21 MED ORDER — ONDANSETRON HCL 4 MG/2ML IJ SOLN: 4.0000 mg | Freq: Four times a day (QID) | INTRAMUSCULAR | Status: DC | PRN

## 2014-06-21 MED ORDER — IOHEXOL 300 MG/ML  SOLN
100.0000 mL | Freq: Once | INTRAMUSCULAR | Status: AC | PRN
  Administered 2014-06-21: 100 mL via INTRAVENOUS

## 2014-06-21 MED ORDER — INSULIN DETEMIR 100 UNIT/ML ~~LOC~~ SOLN
15.0000 [IU] | Freq: Two times a day (BID) | SUBCUTANEOUS | Status: DC
  Administered 2014-06-22 – 2014-06-24 (×5): 15 [IU] via SUBCUTANEOUS
  Filled 2014-06-21 (×8): qty 0.15

## 2014-06-21 MED ORDER — DEXTROSE 50 % IV SOLN
1.0000 | Freq: Once | INTRAVENOUS | Status: AC
  Administered 2014-06-21: 50 mL via INTRAVENOUS

## 2014-06-21 MED ORDER — SODIUM CHLORIDE 0.9 % IV SOLN: INTRAVENOUS | Status: AC

## 2014-06-21 MED ORDER — SIMVASTATIN 20 MG PO TABS
20.0000 mg | ORAL_TABLET | Freq: Every evening | ORAL | Status: DC
  Administered 2014-06-21 – 2014-06-23 (×3): 20 mg via ORAL
  Filled 2014-06-21 (×3): qty 1

## 2014-06-21 MED ORDER — SODIUM CHLORIDE 0.9 % IV BOLUS (SEPSIS)
1000.0000 mL | Freq: Once | INTRAVENOUS | Status: AC
  Administered 2014-06-21: 1000 mL via INTRAVENOUS

## 2014-06-21 MED ORDER — LISINOPRIL 10 MG PO TABS
10.0000 mg | ORAL_TABLET | Freq: Every day | ORAL | Status: DC
  Administered 2014-06-21 – 2014-06-24 (×2): 10 mg via ORAL
  Filled 2014-06-21 (×3): qty 1

## 2014-06-21 MED ORDER — CLINDAMYCIN PHOSPHATE 600 MG/50ML IV SOLN
600.0000 mg | Freq: Three times a day (TID) | INTRAVENOUS | Status: DC
  Filled 2014-06-21 (×2): qty 50

## 2014-06-21 MED ORDER — PIPERACILLIN-TAZOBACTAM 3.375 G IVPB
3.3750 g | Freq: Three times a day (TID) | INTRAVENOUS | Status: DC
  Administered 2014-06-21 – 2014-06-24 (×10): 3.375 g via INTRAVENOUS
  Filled 2014-06-21 (×14): qty 50

## 2014-06-21 MED ORDER — SODIUM CHLORIDE 0.9 % IJ SOLN
INTRAMUSCULAR | Status: AC
  Filled 2014-06-21: qty 36

## 2014-06-21 MED ORDER — DEXTROSE-NACL 5-0.9 % IV SOLN
INTRAVENOUS | Status: DC
  Administered 2014-06-21: 09:00:00 via INTRAVENOUS

## 2014-06-21 MED ORDER — PIPERACILLIN-TAZOBACTAM 3.375 G IVPB
INTRAVENOUS | Status: AC
  Filled 2014-06-21: qty 50

## 2014-06-21 NOTE — H&P (Signed)
[facility] History and Physical  Nathaniel [name] [id number] DOB: [birth year] DOA: 06/21/2014  Referring physician: Dr Kathrynn Humble PCP: Maricela Curet, MD  Specialists: Dr Arnoldo Morale - General Surgery  Chief Complaint: abscess  Assessment/Plan Active Problems: Abscess, gluteal, right Diabetes Hyperglycemia Tobacco abuse HTN HLD  Abscess: CT showing 4.1 cm ill-defined fluid and gas collection in the right gluteal cleft. General surgery consulted by ED. Afebrile and VS stable. No crepitus on exam. WBC 14.7 w/ L shift - Admit to med surge - f/u Gen Surge recs - bedside vs OR I&D - Continue clinda - add zosyn for pseudo coverage - Wound Cx - NPO - EKG - pre-op  Diabetes: Type I. CBG on admission 409. On Levemir 60 in am and 70 in PM and sliding scale Novolog w/ meals. No gap. Last A1c 03/29/13 10.4.  - A1c - SSI - pt NPO - add back Levemir once taking PO again  HTN: normotensive - continue lisinopril  Tobacco abuse: continues to smoke - nicotine patch 14mg  Qday  DVT Prophylaxis: SCD -    Code Status: FULL Family Communication: Mother present at time of admission Disposition Plan: pending further workup  HPI: Nathaniel [name] is a 28 y.o. male came to [facility] ed 06/21/2014 with  Buttock abscess. Started 6 days aago w/ a small pimple on buttock. R side. Started to drain 3 days ago. Peroxide and neosporin w/ some benefit. Recurring issue for pt (about 1x yearly.). Denies SOB, CP, Fevers, rash, nausea, vomiting. Painful.   Review of Systems: Per HPI w/ all other systems negative.   Past Medical History  Diagnosis Date  . Diabetes mellitus     lantus/novolog  . Tobacco abuse     5/day  . Marijuana abuse     occaisionally   Past Surgical History  Procedure Laterality Date  . Eye surgery     Social History:  History   Social History Narrative  . No narrative on file    Allergies  Allergen Reactions  . Orange     Increases blood sugar immediately      Family History  Problem Relation Age of Onset  . Diabetes Father   . Hypertension Mother   . Kidney failure Father     last 4 mnths of life     Prior to Admission medications   Medication Sig Start Date End Date Taking? Authorizing Provider  insulin aspart (NOVOLOG) 100 UNIT/ML injection Inject 0-15 Units into the skin 3 (three) times daily with meals. 04/10/13  Yes Maricela Curet, MD  insulin detemir (LEVEMIR) 100 unit/ml SOLN Inject 60-70 Units into the skin 2 (two) times daily. 80 units in the morning and 70 in the evening.   Yes Historical Provider, MD  lisinopril (PRINIVIL,ZESTRIL) 10 MG tablet Take 1 tablet (10 mg total) by mouth daily. 04/10/13   Maricela Curet, MD  simvastatin (ZOCOR) 20 MG tablet Take 20 mg by mouth every evening.    Historical Provider, MD   Physical Exam: Filed Vitals:   06/21/14 0018 06/21/14 0312 06/21/14 0400  BP: 143/99 123/79 115/76  Pulse: 102 82 93  Temp: 98.6 F (37 C)    TempSrc: Oral    Resp: 18 16 18   Height: 6\' 1"  (1.854 m)    Weight: 68.04 kg (150 lb)    SpO2: 100% 99% 100%     General:  Mild distress, laying in bed, young  Eyes: EOMI  ENT: mmm, no nasal congestion  Neck: FROM, no stridor  Cardiovascular: RRR, no m/r/g  Respiratory: Nml WOB. No wheezes, ronchi. Good breath sounds throughout  Abdomen: NABS, nonttp  Skin: R medial gluiteal cleft w/ large area of induration and erythema. Central area actively discharging foul smelling puss. ttp but w/o crepitus  Musculoskeletal: No LE edema, moves all extremities spontaneously   Psychiatric: nml affect, answers questions appropriately  Neurologic: CN2-12 Grossly intact, cerebellar fxn nml  Labs on Admission:  Basic Metabolic Panel:  Recent Labs Lab 06/21/14 0135  NA 136*  K 4.4  CL 96  CO2 30  GLUCOSE 409*  BUN 14  CREATININE 0.89  CALCIUM 9.0   Liver Function Tests: No results found for this basename: AST, ALT, ALKPHOS, BILITOT, PROT, ALBUMIN,  in  the last 168 hours No results found for this basename: LIPASE, AMYLASE,  in the last 168 hours No results found for this basename: AMMONIA,  in the last 168 hours CBC:  Recent Labs Lab 06/21/14 0135  WBC 14.7*  NEUTROABS 10.8*  HGB 10.8*  HCT 31.9*  MCV 89.6  PLT 325   Cardiac Enzymes: No results found for this basename: CKTOTAL, CKMB, CKMBINDEX, TROPONINI,  in the last 168 hours  BNP (last 3 results) No results found for this basename: PROBNP,  in the last 8760 hours CBG:  Recent Labs Lab 06/21/14 0028 06/21/14 0209 06/21/14 0409  GLUCAP 477* 318* 173*    Radiological Exams on Admission: Ct Pelvis W Contrast  06/21/2014   CLINICAL DATA:  Right gluteal abscess  EXAM: CT PELVIS WITH CONTRAST  TECHNIQUE: Multidetector CT imaging of the pelvis was performed using the standard protocol following the bolus administration of intravenous contrast.  CONTRAST:  129mL OMNIPAQUE IOHEXOL 300 MG/ML  SOLN  COMPARISON:  CT abdomen pelvis dated 01/09/2014  FINDINGS: 4.1 x 3.3 x 3.4 cm fluid and gas collection in the right gluteal cleft, corresponding to the clinical abnormality. This does not reflect a well-defined fluid collection/drainable abscess. Medially, this is within 2-3 mm of the skin surface (series 3/image 40). No definite perirectal communication.  No pelvic ascites.  No suspicious pelvic lymphadenopathy.  Prostate is unremarkable.  Bladder is within normal limits.  Visualized osseous structures are within normal limits.  IMPRESSION: 4.1 cm ill-defined fluid and gas collection in the right gluteal cleft, corresponding to the clinical abnormality, within 2-3 mm of the skin surface.   Electronically Signed   By: Julian Hy M.D.   On: 06/21/2014 02:56     MERRELL, Grayling Congress, MD [facility] [email or web address] Password [email or web address] 06/21/2014, 4:17 AM

## 2014-06-21 NOTE — Consult Note (Signed)
Reason for Consult: Perirectal abscess Referring Physician: Hospitalist  Nathaniel [name] is an 28 y.o. male.  HPI: Patient is a 28 year old black male with history of diabetes mellitus who presented to emergency room with a draining wound from the right buttock. This was confirmed on CAT scan. He does have leukocytosis. History was difficult to get from the patient as he is currently hypoglycemic.  Past Medical History  Diagnosis Date  . Diabetes mellitus     lantus/novolog  . Tobacco abuse     5/day  . Marijuana abuse     occaisionally    Past Surgical History  Procedure Laterality Date  . Eye surgery      Family History  Problem Relation Age of Onset  . Diabetes Father   . Hypertension Mother   . Kidney failure Father     last 4 mnths of life    Social History:  reports that he has been smoking Cigarettes.  He has a 8 pack-year smoking history. He has never used smokeless tobacco. He reports that he uses illicit drugs (Marijuana). He reports that he does not drink alcohol.  Allergies:  Allergies  Allergen Reactions  . Orange     Increases blood sugar immediately     Medications: I have reviewed the patient's current medications.  Results for orders placed during the hospital encounter of 06/21/14 (from the past 48 hour(s))  CBG MONITORING, ED     Status: Abnormal   Collection Time    06/21/14 12:28 AM      Result Value Ref Range   Glucose-Capillary 477 (*) 70 - 99 mg/dL  CBC WITH DIFFERENTIAL     Status: Abnormal   Collection Time    06/21/14  1:35 AM      Result Value Ref Range   WBC 14.7 (*) 4.0 - 10.5 K/uL   RBC 3.56 (*) 4.22 - 5.81 MIL/uL   Hemoglobin 10.8 (*) 13.0 - 17.0 g/dL   HCT 31.9 (*) 39.0 - 52.0 %   MCV 89.6  78.0 - 100.0 fL   MCH 30.3  26.0 - 34.0 pg   MCHC 33.9  30.0 - 36.0 g/dL   RDW 13.1  11.5 - 15.5 %   Platelets 325  150 - 400 K/uL   Neutrophils Relative % 72  43 - 77 %   Neutro Abs 10.8 (*) 1.7 - 7.7 K/uL   Lymphocytes Relative 18   12 - 46 %   Lymphs Abs 2.6  0.7 - 4.0 K/uL   Monocytes Relative 6  3 - 12 %   Monocytes Absolute 0.8  0.1 - 1.0 K/uL   Eosinophils Relative 3  0 - 5 %   Eosinophils Absolute 0.4  0.0 - 0.7 K/uL   Basophils Relative 1  0 - 1 %   Basophils Absolute 0.1  0.0 - 0.1 K/uL  BASIC METABOLIC PANEL     Status: Abnormal   Collection Time    06/21/14  1:35 AM      Result Value Ref Range   Sodium 136 (*) 137 - 147 mEq/L   Potassium 4.4  3.7 - 5.3 mEq/L   Chloride 96  96 - 112 mEq/L   CO2 30  19 - 32 mEq/L   Glucose, Bld 409 (*) 70 - 99 mg/dL   BUN 14  6 - 23 mg/dL   Creatinine, Ser 0.89  0.50 - 1.35 mg/dL   Calcium 9.0  8.4 - 10.5 mg/dL   GFR calc non  Af Amer >90  >90 mL/min   GFR calc Af Amer >90  >90 mL/min   Comment: (NOTE)     The eGFR has been calculated using the CKD EPI equation.     This calculation has not been validated in all clinical situations.     eGFR's persistently <90 mL/min signify possible Chronic Kidney     Disease.   Anion gap 10  5 - 15  BLOOD GAS, VENOUS     Status: Abnormal   Collection Time    06/21/14  1:36 AM      Result Value Ref Range   pH, Ven 7.381 (*) 7.250 - 7.300   pCO2, Ven 54.6 (*) 45.0 - 50.0 mmHg   pO2, Ven 37.3  30.0 - 45.0 mmHg   Bicarbonate 31.7 (*) 20.0 - 24.0 mEq/L   TCO2 29.3  0 - 100 mmol/L   Acid-Base Excess 6.7 (*) 0.0 - 2.0 mmol/L   O2 Saturation 66.5     Patient temperature 37.0     Collection site VEIN     Drawn by [phone]     Sample type VENOUS    CBG MONITORING, ED     Status: Abnormal   Collection Time    06/21/14  2:09 AM      Result Value Ref Range   Glucose-Capillary 318 (*) 70 - 99 mg/dL  CBG MONITORING, ED     Status: Abnormal   Collection Time    06/21/14  4:09 AM      Result Value Ref Range   Glucose-Capillary 173 (*) 70 - 99 mg/dL  GLUCOSE, CAPILLARY     Status: Abnormal   Collection Time    06/21/14  7:54 AM      Result Value Ref Range   Glucose-Capillary 22 (*) 70 - 99 mg/dL   Comment 1 Notify RN    GLUCOSE,  CAPILLARY     Status: Abnormal   Collection Time    06/21/14  8:16 AM      Result Value Ref Range   Glucose-Capillary 21 (*) 70 - 99 mg/dL   Comment 1 Notify RN    GLUCOSE, CAPILLARY     Status: Abnormal   Collection Time    06/21/14  8:39 AM      Result Value Ref Range   Glucose-Capillary 57 (*) 70 - 99 mg/dL   Comment 1 Notify RN      Ct Pelvis W Contrast  06/21/2014   CLINICAL DATA:  Right gluteal abscess  EXAM: CT PELVIS WITH CONTRAST  TECHNIQUE: Multidetector CT imaging of the pelvis was performed using the standard protocol following the bolus administration of intravenous contrast.  CONTRAST:  137m OMNIPAQUE IOHEXOL 300 MG/ML  SOLN  COMPARISON:  CT abdomen pelvis dated 01/09/2014  FINDINGS: 4.1 x 3.3 x 3.4 cm fluid and gas collection in the right gluteal cleft, corresponding to the clinical abnormality. This does not reflect a well-defined fluid collection/drainable abscess. Medially, this is within 2-3 mm of the skin surface (series 3/image 40). No definite perirectal communication.  No pelvic ascites.  No suspicious pelvic lymphadenopathy.  Prostate is unremarkable.  Bladder is within normal limits.  Visualized osseous structures are within normal limits.  IMPRESSION: 4.1 cm ill-defined fluid and gas collection in the right gluteal cleft, corresponding to the clinical abnormality, within 2-3 mm of the skin surface.   Electronically Signed   By: SJulian HyM.D.   On: 06/21/2014 02:56    ROS: See chart Blood pressure  124/78, pulse 92, temperature 99 F (37.2 C), temperature source Oral, resp. rate 20, height '6\' 1"'  (1.854 m), weight 71.2 kg (156 lb 15.5 oz), SpO2 100.00%. Physical Exam: 71 black male in no acute distress. Rectal examination reveals a draining 4-5 cm perirectal abscess, to the right of the anus. Purulent fluid present. This is ordered culture.  Assessment/Plan: Impression: Perirectal abscess, draining Hypoglycemia Plan: No need for surgical intervention  at this time as the wound is freely draining. Would continue antibiotics. I've advanced into a modified diet. We'll follow with you.  Amnah Breuer A 06/21/2014, 9:05 AM

## 2014-06-21 NOTE — Progress Notes (Signed)
Hypoglycemic Event  CBG:  22 @ 0754  Treatment: 15 GM carbohydrate snack and D50 IV 50 mL  Symptoms: Sweaty  Follow-up CBG: Time: 0816 CBG Result: 21  Possible Reasons for Event: Inadequate meal intake  Comments/MD notified: Dr. Karleen Hampshire notified. Patient received carb snack and juice, CBG 21 @ 0816. New orders for dextrose 50% IV, CBG 57 @ 0839, stat lab ordered, CBG rechecked within 15 minutes and was 175.     Nathaniel [name]  Remember to initiate Hypoglycemia Order Set & complete

## 2014-06-21 NOTE — ED Notes (Signed)
Patient states abscess to right buttocks. Patient states drainage.

## 2014-06-21 NOTE — Progress Notes (Signed)
Hypoglycemic Event  CBG: 59  Treatment: 15 GM carbohydrate snack  Symptoms: None  Follow-up CBG: Time: 1744 CBG Result: 43  Possible Reasons for Event: Unknown  Comments/MD notified: Dr. [name] notified of CBG 59, snack and dinner given to patient. Dr. [name] ordered to reduce Levimier to 15 units twice a day. Notified Dr. [name] for CBG 43. New order to hold Levimier tonight. Gave patient more juice and fruit. Rechecked CBG 104 @ 1821.    Nathaniel [name]  Remember to initiate Hypoglycemia Order Set & complete

## 2014-06-21 NOTE — Progress Notes (Signed)
Spoke with patient about diabetes and home regimen for diabetes control. Patient reports that he is followed by his PCP for diabetes management and currently he Levemir 60 units QAM, Levemir 70 units QHS, and rarely Novolog correction scale as an outpatient for diabetes control. According to the patient he has Novolog insulin to use but he does not use it regularly; reports he may use Novolog once every 2 or 3 days. Noted initial lab glucose was 477 and CBG down to 22 mg/dl this morning at 7:54 am.  Inquired about last Levemir patient took prior to coming to the hospital. Patient reports that he last took Levemir between 10:00-11:00 pm on 06/20/14 and that he took 65 units instead of the prescribed 70 units. Patient reports that his last meal prior to coming to the hospital was at lunch on 06/20/14. Anticipate noted hypoglycemic episode today related to taking Levemir and not eating dinner and then being NPO until this morning . Patient reports that is blood glucose has fluctuated from 40-300's mg/dl over the past several weeks. According to the patient he does not have many hypoglycemic episodes and states they only happen if he skips a meal. Encouraged patient to continue to work with his PCP to improve glycemic control by monitoring glucose frequently and keeping a log of readings and medications taken so his PCP will have more data to use to determine what adjustments to make. Patient verbalized understanding of information discussed and he states that he has no further questions at this time related to diabetes.   Thanks, Barnie Alderman, RN, MSN, CCRN Diabetes Coordinator Inpatient Diabetes Program [phone] (Team Pager) [phone] ([facility] office) [phone] [facility] office)

## 2014-06-21 NOTE — Progress Notes (Signed)
/  perirectal abscess currently GUERINO MARCZAK [id number] DOB: 1986-05-25 DOA: 06/21/2014 PCP: Maricela Curet, MD             Physical Exam: Blood pressure 124/78, pulse 92, temperature 99 F (37.2 C), temperature source Oral, resp. rate 20, height 6\' 1"  (1.854 m), weight 156 lb 15.5 oz (71.2 kg), SpO2 100.00%.   lungs clear heart regular rhythm oh murmurs or gallops abdomen in a BS  Investigations:  No results found for this or any previous visit (from the past 240 hour(s)).   Basic Metabolic Panel:  Recent Labs  06/21/14 0135 06/21/14 0835  NA 136* 141  K 4.4 3.2*  CL 96 99  CO2 30 29  GLUCOSE 409* 53*  BUN 14 11  CREATININE 0.89 0.85  CALCIUM 9.0 9.5   Liver Function Tests: No results found for this basename: AST, ALT, ALKPHOS, BILITOT, PROT, ALBUMIN,  in the last 72 hours   CBC:  Recent Labs  06/21/14 0135  WBC 14.7*  NEUTROABS 10.8*  HGB 10.8*  HCT 31.9*  MCV 89.6  PLT 325    Ct Pelvis W Contrast  06/21/2014   CLINICAL DATA:  Right gluteal abscess  EXAM: CT PELVIS WITH CONTRAST  TECHNIQUE: Multidetector CT imaging of the pelvis was performed using the standard protocol following the bolus administration of intravenous contrast.  CONTRAST:  120mL OMNIPAQUE IOHEXOL 300 MG/ML  SOLN  COMPARISON:  CT abdomen pelvis dated 01/09/2014  FINDINGS: 4.1 x 3.3 x 3.4 cm fluid and gas collection in the right gluteal cleft, corresponding to the clinical abnormality. This does not reflect a well-defined fluid collection/drainable abscess. Medially, this is within 2-3 mm of the skin surface (series 3/image 40). No definite perirectal communication.  No pelvic ascites.  No suspicious pelvic lymphadenopathy.  Prostate is unremarkable.  Bladder is within normal limits.  Visualized osseous structures are within normal limits.  IMPRESSION: 4.1 cm ill-defined fluid and gas collection in the right gluteal cleft, corresponding to the clinical abnormality, within 2-3 mm of  the skin surface.   Electronically Signed   By: Julian Hy M.D.   On: 06/21/2014 02:56      Medications  Impression: Insulin-dependent diabetes hypokalemia  Active Problems:   Abscess, gluteal, right     Plan: We did this level may take 20 units a.c. twice a day    Consultants: Surgery   Procedure   Antibiotics: Clindamycin and Zosyn                  Code Status: Of   Family Communication:    Disposition Plan continue IV antibiotics  Time spent: 30 minutes    LOS: 0 days   Nataliya Graig M   06/21/2014, 12:28 PM

## 2014-06-21 NOTE — ED Provider Notes (Signed)
CSN: [id number]     Arrival date & time 06/21/14  0011 History  This chart was scribed for Varney Biles, MD by Delphia Grates, ED Scribe. This patient was seen in room APA01/[facility] and the patient's care was started at 12:45 AM.    Chief Complaint  Patient presents with  . Abscess    The history is provided by the patient. No language interpreter was used.    HPI Comments: Nathaniel [name] is a 28 y.o. male who presents to the Emergency Department complaining of gradually worsening abscess to right buttock onset 6 days ago. There is associated drainage. Patient has history of abscesses and states they occur approximately once a year. He denies any pain, fever, chills, nausea, or vomiting. Patient has history of DM and is noted to be hyperglycemic. Pt states that he has been taking his meds as prescribed.   Past Medical History  Diagnosis Date  . Diabetes mellitus     lantus/novolog  . Tobacco abuse     5/day  . Marijuana abuse     occaisionally   Past Surgical History  Procedure Laterality Date  . Eye surgery     Family History  Problem Relation Age of Onset  . Diabetes Father   . Hypertension Mother   . Kidney failure Father     last 4 mnths of life   History  Substance Use Topics  . Smoking status: Current Every Day Smoker -- 1.00 packs/day for 8 years    Types: Cigarettes  . Smokeless tobacco: Never Used  . Alcohol Use: No    Review of Systems  Constitutional: Negative for fever, chills and activity change.  Eyes: Negative for visual disturbance.  Respiratory: Negative for cough, chest tightness and shortness of breath.   Cardiovascular: Negative for chest pain.  Gastrointestinal: Negative for abdominal distention.  Genitourinary: Negative for dysuria, enuresis and difficulty urinating.  Musculoskeletal: Negative for arthralgias and neck pain.  Skin: Positive for rash.       Abscess   Allergic/Immunologic: Positive for immunocompromised state.   Neurological: Negative for dizziness, light-headedness and headaches.  Psychiatric/Behavioral: Negative for confusion.      Allergies  Orange  Home Medications   Prior to Admission medications   Medication Sig Start Date End Date Taking? Authorizing Provider  insulin aspart (NOVOLOG) 100 UNIT/ML injection Inject 0-15 Units into the skin 3 (three) times daily with meals. 04/10/13  Yes Maricela Curet, MD  insulin detemir (LEVEMIR) 100 unit/ml SOLN Inject 60-70 Units into the skin 2 (two) times daily. 80 units in the morning and 70 in the evening.   Yes Historical Provider, MD  lisinopril (PRINIVIL,ZESTRIL) 10 MG tablet Take 1 tablet (10 mg total) by mouth daily. 04/10/13   Maricela Curet, MD  simvastatin (ZOCOR) 20 MG tablet Take 20 mg by mouth every evening.    Historical Provider, MD   Triage Vitals: BP 143/99  Pulse 102  Temp(Src) 98.6 F (37 C) (Oral)  Resp 18  Ht 6\' 1"  (1.854 m)  Wt 150 lb (68.04 kg)  BMI 19.79 kg/m2  SpO2 100%  Physical Exam  Nursing note and vitals reviewed. Constitutional: He is oriented to person, place, and time. He appears well-developed and well-nourished.  HENT:  Head: Normocephalic and atraumatic.  Eyes: Conjunctivae and EOM are normal. Pupils are equal, round, and reactive to light.  Neck: Normal range of motion. Neck supple.  Cardiovascular: Normal rate and regular rhythm.   Pulmonary/Chest: Effort normal and breath sounds  normal.  Abdominal: Soft. Bowel sounds are normal. He exhibits no distension. There is no tenderness. There is no rebound and no guarding.  Neurological: He is alert and oriented to person, place, and time.  Skin: Skin is warm and dry.  Right glutaeus: there is a 9 cm x15cm area on induration with a 5cm diameter erythematous lesion in center. There is a white head in the middle of the lesion. Not crepitus. No true fluctuance appreciated.  Psychiatric: He has a normal mood and affect. His behavior is normal.    ED  Course  Procedures (including critical care time)  DIAGNOSTIC STUDIES: Oxygen Saturation is 100% on room air, normal by my interpretation.    COORDINATION OF CARE: At 72 Discussed treatment plan with patient which includes imaging. Patient agrees.   Labs Review Labs Reviewed  CBC WITH DIFFERENTIAL - Abnormal; Notable for the following:    WBC 14.7 (*)    RBC 3.56 (*)    Hemoglobin 10.8 (*)    HCT 31.9 (*)    Neutro Abs 10.8 (*)    All other components within normal limits  BASIC METABOLIC PANEL - Abnormal; Notable for the following:    Sodium 136 (*)    Glucose, Bld 409 (*)    All other components within normal limits  BLOOD GAS, VENOUS - Abnormal; Notable for the following:    pH, Ven 7.381 (*)    pCO2, Ven 54.6 (*)    Bicarbonate 31.7 (*)    Acid-Base Excess 6.7 (*)    All other components within normal limits  CBG MONITORING, ED - Abnormal; Notable for the following:    Glucose-Capillary 477 (*)    All other components within normal limits  CBG MONITORING, ED - Abnormal; Notable for the following:    Glucose-Capillary 318 (*)    All other components within normal limits  CBG MONITORING, ED - Abnormal; Notable for the following:    Glucose-Capillary 173 (*)    All other components within normal limits  WOUND CULTURE  HEMOGLOBIN A1C    Imaging Review Ct Pelvis W Contrast  06/21/2014   CLINICAL DATA:  Right gluteal abscess  EXAM: CT PELVIS WITH CONTRAST  TECHNIQUE: Multidetector CT imaging of the pelvis was performed using the standard protocol following the bolus administration of intravenous contrast.  CONTRAST:  111mL OMNIPAQUE IOHEXOL 300 MG/ML  SOLN  COMPARISON:  CT abdomen pelvis dated 01/09/2014  FINDINGS: 4.1 x 3.3 x 3.4 cm fluid and gas collection in the right gluteal cleft, corresponding to the clinical abnormality. This does not reflect a well-defined fluid collection/drainable abscess. Medially, this is within 2-3 mm of the skin surface (series 3/image 40). No  definite perirectal communication.  No pelvic ascites.  No suspicious pelvic lymphadenopathy.  Prostate is unremarkable.  Bladder is within normal limits.  Visualized osseous structures are within normal limits.  IMPRESSION: 4.1 cm ill-defined fluid and gas collection in the right gluteal cleft, corresponding to the clinical abnormality, within 2-3 mm of the skin surface.   Electronically Signed   By: Julian Hy M.D.   On: 06/21/2014 02:56     EKG Interpretation None      MDM   Final diagnoses:  Abscess, gluteal, right  Cellulitis and abscess  Hyperglycemia    I personally performed the services described in this documentation, which was scribed in my presence. The recorded information has been reviewed and is accurate.  Pt comes in with abscess and is noted to be hyperglycemic. He  has 2 SIRs criteria, and a gluteal lesion, with likely an abscess. The lesion is fairly large. CT scan ordered to r/o necrotizing infection.  CT does show an abscess, with gas. Doesn't appear to be deep, invading type of infection. Surgery consulted - and Dr. Arnoldo Morale would appreciate admission to medicine, so thath is team can assess patient in the AM for treatment. NPO for now. Clinda prescribed.   Varney Biles, MD 06/21/14 [phone]

## 2014-06-21 NOTE — Progress Notes (Signed)
ANTIBIOTIC CONSULT NOTE - INITIAL  Pharmacy Consult for Zosyn Indication: right gluteal abscess  Allergies  Allergen Reactions  . Orange     Increases blood sugar immediately     Patient Measurements: Height: 6\' 1"  (185.4 cm) Weight: 150 lb (68.04 kg) IBW/kg (Calculated) : 79.9    Actual body weight less than ideal weight  Vital Signs: Temp: 98.6 F (37 C) (10/02 0018) Temp Source: Oral (10/02 0018) BP: 115/76 mmHg (10/02 0400) Pulse Rate: 93 (10/02 0400) Intake/Output from previous day: 10/01 0701 - 10/02 0700 In: 1200 [I.V.:1200] Out: 300 [Urine:300] Intake/Output from this shift: Total I/O In: 1200 [I.V.:1200] Out: 300 [Urine:300]  Labs:  Recent Labs  06/21/14 0135  WBC 14.7*  HGB 10.8*  PLT 325  CREATININE 0.89   Estimated Creatinine Clearance: 119.9 ml/min (by C-G formula based on Cr of 0.89). No results found for this basename: VANCOTROUGH, VANCOPEAK, VANCORANDOM, GENTTROUGH, GENTPEAK, GENTRANDOM, TOBRATROUGH, TOBRAPEAK, TOBRARND, AMIKACINPEAK, AMIKACINTROU, AMIKACIN,  in the last 72 hours   Microbiology: No results found for this or any previous visit (from the past 720 hour(s)).  Medical History: Past Medical History  Diagnosis Date  . Diabetes mellitus     lantus/novolog  . Tobacco abuse     5/day  . Marijuana abuse     occaisionally    Medications:  Scheduled:  . sodium chloride   Intravenous STAT  . clindamycin (CLEOCIN) IV  600 mg Intravenous 3 times per day  . clindamycin (CLEOCIN) IV  600 mg Intravenous Q8H  . insulin aspart  0-20 Units Subcutaneous TID WC  . insulin aspart  0-5 Units Subcutaneous QHS  . lisinopril  10 mg Oral Daily  . piperacillin-tazobactam (ZOSYN)  IV  3.375 g Intravenous 3 times per day  . senna  1 tablet Oral BID  . simvastatin  20 mg Oral QPM  . sodium chloride      . sodium chloride       Infusions:   PRN: acetaminophen, acetaminophen, morphine injection, ondansetron (ZOFRAN) IV,  ondansetron  Assessment: 55yr diabetic male, severely underweight, with expected CrCl less than calculated 113ml/min due to muscle wasting (probably est CrCl 60-23ml/min).  Patient has right gluteal abscess.  Patient presently on tx with clindamycin.  Goal of Therapy:  Standard regimen of Zosyn with CrCl>52ml/min is 3.375gm IV q8h  Plan:  1. Zosyn 3.375gm IV q8h (4hr infusions) 2.  Monitor for indices of infection and renal function 3.  Is clindamycin a duplication for anaerobic coverage - perhaps consider vancomycin for more aggressive staph coverage?  Jose Alleyne, Milta Deiters E 06/21/2014,4:52 AM

## 2014-06-21 NOTE — Care Management Note (Signed)
    Page 1 of 1   06/24/2014     3:39:57 PM CARE MANAGEMENT NOTE 06/24/2014  Patient:  Nathaniel [name], Nathaniel [name]   Account Number:  [id number]  Date Initiated:  06/21/2014  Documentation initiated by:  Vladimir Creeks  Subjective/Objective Assessment:   Admitted with abscess, on buttock. Did not need an I&D, it is draining. Pt is from home, and will return home at D/C     Action/Plan:   No needs identified   Anticipated DC Date:  06/24/2014   Anticipated DC Plan:  [place]  CM consult      Choice offered to / List presented to:             Status of service:  Completed, signed off Medicare Important Message given?  YES (If response is "NO", the following Medicare IM given date fields will be blank) Date Medicare IM given:  06/24/2014 Medicare IM given by:  Vladimir Creeks Date Additional Medicare IM given:   Additional Medicare IM given by:    Discharge Disposition:  HOME/SELF CARE  Per UR Regulation:  Reviewed for med. necessity/level of care/duration of stay  If discussed at [place] of Stay Meetings, dates discussed:    Comments:  06/21/14 1500 Vladimir Creeks RN/CM

## 2014-06-22 LAB — BASIC METABOLIC PANEL
ANION GAP: 10 (ref 5–15)
BUN: 6 mg/dL (ref 6–23)
CALCIUM: 8.9 mg/dL (ref 8.4–10.5)
CHLORIDE: 100 meq/L (ref 96–112)
CO2: 31 meq/L (ref 19–32)
Creatinine, Ser: 0.9 mg/dL (ref 0.50–1.35)
GFR calc Af Amer: >90 mL/min (ref >90)
GFR calc non Af Amer: >90 mL/min (ref >90)
GLUCOSE: 103 mg/dL — AB (ref 70–99)
POTASSIUM: 4 meq/L (ref 3.7–5.3)
SODIUM: 141 meq/L (ref 137–147)

## 2014-06-22 LAB — GLUCOSE, CAPILLARY
GLUCOSE-CAPILLARY: 200 mg/dL — AB (ref 70–99)
Glucose-Capillary: 147 mg/dL — ABNORMAL HIGH (ref 70–99)
Glucose-Capillary: 105 mg/dL — ABNORMAL HIGH (ref 70–99)
Glucose-Capillary: 139 mg/dL — ABNORMAL HIGH (ref 70–99)

## 2014-06-22 NOTE — Progress Notes (Signed)
  Subjective: No complaints.  Objective: Vital signs in last 24 hours: Temp:  [98.3 F (36.8 C)-98.7 F (37.1 C)] 98.7 F (37.1 C) (10/03 0658) Pulse Rate:  [84-91] 91 (10/03 0658) Resp:  [11] 11 (10/03 0658) BP: (125-127)/(73-74) 125/73 mmHg (10/03 0658) SpO2:  [100 %] 100 % (10/03 0658)    Intake/Output from previous day: 10/02 0701 - 10/03 0700 In: 1090 [P.O.:840; IV Piggyback:250] Out: 250 [Urine:250] Intake/Output this shift: Total I/O In: 120 [P.O.:120] Out: -   General appearance: alert, cooperative and no distress GI: Perirectal abscess still draining purulent fluid.  Lab Results:   Recent Labs  06/21/14 0135  WBC 14.7*  HGB 10.8*  HCT 31.9*  PLT 325   BMET  Recent Labs  06/21/14 0835 06/22/14 0552  NA 141 141  K 3.2* 4.0  CL 99 100  CO2 29 31  GLUCOSE 53* 103*  BUN 11 6  CREATININE 0.85 0.90  CALCIUM 9.5 8.9   PT/INR No results found for this basename: LABPROT, INR,  in the last 72 hours  Studies/Results: Ct Pelvis W Contrast  06/21/2014   CLINICAL DATA:  Right gluteal abscess  EXAM: CT PELVIS WITH CONTRAST  TECHNIQUE: Multidetector CT imaging of the pelvis was performed using the standard protocol following the bolus administration of intravenous contrast.  CONTRAST:  128mL OMNIPAQUE IOHEXOL 300 MG/ML  SOLN  COMPARISON:  CT abdomen pelvis dated 01/09/2014  FINDINGS: 4.1 x 3.3 x 3.4 cm fluid and gas collection in the right gluteal cleft, corresponding to the clinical abnormality. This does not reflect a well-defined fluid collection/drainable abscess. Medially, this is within 2-3 mm of the skin surface (series 3/image 40). No definite perirectal communication.  No pelvic ascites.  No suspicious pelvic lymphadenopathy.  Prostate is unremarkable.  Bladder is within normal limits.  Visualized osseous structures are within normal limits.  IMPRESSION: 4.1 cm ill-defined fluid and gas collection in the right gluteal cleft, corresponding to the clinical  abnormality, within 2-3 mm of the skin surface.   Electronically Signed   By: Julian Hy M.D.   On: 06/21/2014 02:56    Anti-infectives: Anti-infectives   Start     Dose/Rate Route Frequency Ordered Stop   06/21/14 1200  clindamycin (CLEOCIN) IVPB 600 mg     600 mg 100 mL/hr over 30 Minutes Intravenous Every 8 hours 06/21/14 0450     06/21/14 0900  clindamycin (CLEOCIN) IVPB 600 mg  Status:  Discontinued     600 mg 100 mL/hr over 30 Minutes Intravenous 3 times per day 06/21/14 0442 06/21/14 0502   06/21/14 0600  piperacillin-tazobactam (ZOSYN) IVPB 3.375 g     3.375 g 12.5 mL/hr over 240 Minutes Intravenous 3 times per day 06/21/14 0454     06/21/14 0400  clindamycin (CLEOCIN) IVPB 900 mg     900 mg 100 mL/hr over 30 Minutes Intravenous  Once 06/21/14 0350 06/21/14 0429      Assessment/Plan: Impression: Perirectal abscess, resolving. Would continue IV antibiotics for now. Upon discharge, would place on ciprofloxacin and Flagyl for 10 days. No need for incision and drainage as the abscess is freely draining.  LOS: 1 day    Dvon Jiles A 06/22/2014

## 2014-06-22 NOTE — Progress Notes (Signed)
Patient with gluteal abscess draining on dual antibiotics normal glycemic insulin-dependent diabetes hypokalemia resolved Nathaniel [name] [id number] DOB: 13-Jul-1986 DOA: 06/21/2014 PCP: Nathaniel Curet, MD             Physical Exam: Blood pressure 125/73, pulse 91, temperature 98.7 F (37.1 C), temperature source Oral, resp. rate 11, height 6\' 1"  (1.854 m), weight 156 lb 15.5 oz (71.2 kg), SpO2 100.00%. wound packed seen by surgery this morning lungs clear heart regular rhythm no gallops no murmurs abdomen benign   Investigations:  Recent Results (from the past 240 hour(s))  WOUND CULTURE     Status: None   Collection Time    06/21/14  6:40 AM      Result Value Ref Range Status   Specimen Description OTHER   Final   Special Requests NONE   Final   Gram Stain     Final   Value: ABUNDANT WBC PRESENT,BOTH PMN AND MONONUCLEAR     RARE SQUAMOUS EPITHELIAL CELLS PRESENT     ABUNDANT GRAM NEGATIVE RODS     FEW GRAM POSITIVE COCCI     IN PAIRS IN CLUSTERS   Culture PENDING   Incomplete   Report Status PENDING   Incomplete     Basic Metabolic Panel:  Recent Labs  06/21/14 0835 06/22/14 0552  NA 141 141  K 3.2* 4.0  CL 99 100  CO2 29 31  GLUCOSE 53* 103*  BUN 11 6  CREATININE 0.85 0.90  CALCIUM 9.5 8.9   Liver Function Tests: No results found for this basename: AST, ALT, ALKPHOS, BILITOT, PROT, ALBUMIN,  in the last 72 hours   CBC:  Recent Labs  06/21/14 0135  WBC 14.7*  NEUTROABS 10.8*  HGB 10.8*  HCT 31.9*  MCV 89.6  PLT 325    Ct Pelvis W Contrast  06/21/2014   CLINICAL DATA:  Right gluteal abscess  EXAM: CT PELVIS WITH CONTRAST  TECHNIQUE: Multidetector CT imaging of the pelvis was performed using the standard protocol following the bolus administration of intravenous contrast.  CONTRAST:  1101mL OMNIPAQUE IOHEXOL 300 MG/ML  SOLN  COMPARISON:  CT abdomen pelvis dated 01/09/2014  FINDINGS: 4.1 x 3.3 x 3.4 cm fluid and gas collection in the right  gluteal cleft, corresponding to the clinical abnormality. This does not reflect a well-defined fluid collection/drainable abscess. Medially, this is within 2-3 mm of the skin surface (series 3/image 40). No definite perirectal communication.  No pelvic ascites.  No suspicious pelvic lymphadenopathy.  Prostate is unremarkable.  Bladder is within normal limits.  Visualized osseous structures are within normal limits.  IMPRESSION: 4.1 cm ill-defined fluid and gas collection in the right gluteal cleft, corresponding to the clinical abnormality, within 2-3 mm of the skin surface.   Electronically Signed   By: Nathaniel [name] M.D.   On: 06/21/2014 02:56      Medication  Impression: Insulin-dependent diabetes normal glycemic at present according anemia now resolved hyperlipidemia controlled Active Problems:   Abscess, gluteal, right     Plan: Continue clindamycin and Zosyn for 48 more hours and switch to Flagyl Cipro by mouth for discharge   Consultants: Surgery   Procedures   Antibiotics: Clindamycin and Zosyn nightly                  Code Status:   Family Communication:   Disposition Plan 48 hours of observation of drainage and IV antibiotics and consider discharge depending on clinical course  Time spent: 20 minutes  LOS: 1 day   Nathaniel [name] M   06/22/2014, 10:22 AM    Patient with very rectal abscess on dual antibiotics with drainage improving Nathaniel [name] [id number] DOB: [birth year] DOA: 06/21/2014 PCP: Nathaniel Curet, MD             Physical Exam: Blood pressure 125/73, pulse 91, temperature 98.7 F (37.1 C), temperature source Oral, resp. rate 11, height 6\' 1"  (1.854 m), weight 156 lb 15.5 oz (71.2 kg), SpO2 100.00%. lungs clear to A&P heart regular rhythm no S3-S4 easels rubs abdomen essentially benign wound packed present seen by surgery   Investigations:  Recent Results (from the past 240 hour(s))  WOUND CULTURE     Status: None    Collection Time    06/21/14  6:40 AM      Result Value Ref Range Status   Specimen Description OTHER   Final   Special Requests NONE   Final   Gram Stain     Final   Value: ABUNDANT WBC PRESENT,BOTH PMN AND MONONUCLEAR     RARE SQUAMOUS EPITHELIAL CELLS PRESENT     ABUNDANT GRAM NEGATIVE RODS     FEW GRAM POSITIVE COCCI     IN PAIRS IN CLUSTERS   Culture PENDING   Incomplete   Report Status PENDING   Incomplete     Basic Metabolic Panel:  Recent Labs  06/21/14 0835 06/22/14 0552  NA 141 141  K 3.2* 4.0  CL 99 100  CO2 29 31  GLUCOSE 53* 103*  BUN 11 6  CREATININE 0.85 0.90  CALCIUM 9.5 8.9   Liver Function Tests: No results found for this basename: AST, ALT, ALKPHOS, BILITOT, PROT, ALBUMIN,  in the last 72 hours   CBC:  Recent Labs  06/21/14 0135  WBC 14.7*  NEUTROABS 10.8*  HGB 10.8*  HCT 31.9*  MCV 89.6  PLT 325    Ct Pelvis W Contrast  06/21/2014   CLINICAL DATA:  Right gluteal abscess  EXAM: CT PELVIS WITH CONTRAST  TECHNIQUE: Multidetector CT imaging of the pelvis was performed using the standard protocol following the bolus administration of intravenous contrast.  CONTRAST:  19mL OMNIPAQUE IOHEXOL 300 MG/ML  SOLN  COMPARISON:  CT abdomen pelvis dated 01/09/2014  FINDINGS: 4.1 x 3.3 x 3.4 cm fluid and gas collection in the right gluteal cleft, corresponding to the clinical abnormality. This does not reflect a well-defined fluid collection/drainable abscess. Medially, this is within 2-3 mm of the skin surface (series 3/image 40). No definite perirectal communication.  No pelvic ascites.  No suspicious pelvic lymphadenopathy.  Prostate is unremarkable.  Bladder is within normal limits.  Visualized osseous structures are within normal limits.  IMPRESSION: 4.1 cm ill-defined fluid and gas collection in the right gluteal cleft, corresponding to the clinical abnormality, within 2-3 mm of the skin surface.   Electronically Signed   By: Nathaniel [name] M.D.   On:  06/21/2014 02:56      Medications:   Impression: Insulin-dependent diabetes hypokalemia resolved hyperlipidemia Active Problems:   Abscess, gluteal, right     Plan: Continue dual antibiotics intravenously for 48 hours then intended to discharge on Cipro and Flagyl by mouth   Consultants: Surgery   Procedures   Antibiotics: Clindamycin and Zosyn                  Code Status:   Family Communication:   Disposition Plan continue dual antibiotics switched to by mouth and approximately 48 hours depending on clinical  course  Time spent: 20 minutes   LOS: 1 day   Naresh Althaus M   06/22/2014, 10:20 AM

## 2014-06-23 LAB — GLUCOSE, CAPILLARY
GLUCOSE-CAPILLARY: 190 mg/dL — AB (ref 70–99)
GLUCOSE-CAPILLARY: 163 mg/dL — AB (ref 70–99)
Glucose-Capillary: 102 mg/dL — ABNORMAL HIGH (ref 70–99)
Glucose-Capillary: 179 mg/dL — ABNORMAL HIGH (ref 70–99)

## 2014-06-23 LAB — BASIC METABOLIC PANEL
ANION GAP: 10 (ref 5–15)
BUN: 8 mg/dL (ref 6–23)
CALCIUM: 9.1 mg/dL (ref 8.4–10.5)
CO2: 29 meq/L (ref 19–32)
Chloride: 97 mEq/L (ref 96–112)
Creatinine, Ser: 0.93 mg/dL (ref 0.50–1.35)
GFR calc Af Amer: >90 mL/min (ref >90)
GFR calc non Af Amer: >90 mL/min (ref >90)
Glucose, Bld: 240 mg/dL — ABNORMAL HIGH (ref 70–99)
POTASSIUM: 4.3 meq/L (ref 3.7–5.3)
SODIUM: 136 meq/L — AB (ref 137–147)

## 2014-06-23 LAB — WOUND CULTURE

## 2014-06-23 NOTE — Progress Notes (Signed)
Patient doing well less severe pain date 5 on dual antibiotics SABASTIAN EVERAGE [id number] DOB: 30-Jan-1986 DOA: 06/21/2014 PCP: Maricela Curet, MD             Physical Exam: Blood pressure 136/82, pulse 90, temperature 98.8 F (37.1 C), temperature source Oral, resp. rate 20, height 6\' 1"  (1.854 m), weight 156 lb 15.5 oz (71.2 kg), SpO2 100.00%. lungs clear to A&P heart regular rhythm no S3-S4 no heave thrills or rubs abdomen soft nontender bowel sounds normoactive   Investigations:  Recent Results (from the past 240 hour(s))  WOUND CULTURE     Status: None   Collection Time    06/21/14  6:40 AM      Result Value Ref Range Status   Specimen Description OTHER   Final   Special Requests NONE   Final   Gram Stain     Final   Value: ABUNDANT WBC PRESENT,BOTH PMN AND MONONUCLEAR     RARE SQUAMOUS EPITHELIAL CELLS PRESENT     ABUNDANT GRAM NEGATIVE RODS     FEW GRAM POSITIVE COCCI     IN PAIRS IN CLUSTERS   Culture     Final   Value: FEW GROUP B STREP(S.AGALACTIAE)ISOLATED     Note: TESTING AGAINST S. AGALACTIAE NOT ROUTINELY PERFORMED DUE TO PREDICTABILITY OF AMP/PEN/VAN SUSCEPTIBILITY.     Performed at [facility]   Report Status 06/23/2014 FINAL   Final     Basic Metabolic Panel:  Recent Labs  06/22/14 0552 06/23/14 0545  NA 141 136*  K 4.0 4.3  CL 100 97  CO2 31 29  GLUCOSE 103* 240*  BUN 6 8  CREATININE 0.90 0.93  CALCIUM 8.9 9.1   Liver Function Tests: No results found for this basename: AST, ALT, ALKPHOS, BILITOT, PROT, ALBUMIN,  in the last 72 hours   CBC:  Recent Labs  06/21/14 0135  WBC 14.7*  NEUTROABS 10.8*  HGB 10.8*  HCT 31.9*  MCV 89.6  PLT 325    No results found.    Medications:   Impression: Insulin-dependent diabetes Active Problems:   Abscess, gluteal, right     Plan continue antibiotics and opioid analgesia  Consultants: Surgery   Procedures   Antibiotics: Clindamycin and Zosyn           Code Status:   Family Communication:    Disposition Plan discharge on Cipro Cipro and Flagyl by mouth and opioid analgesia within one to 2 days  Time spent: 20 minutes   LOS: 2 days   Anysa Tacey M   06/23/2014, 2:40 PM

## 2014-06-24 LAB — BASIC METABOLIC PANEL
ANION GAP: 10 (ref 5–15)
BUN: 11 mg/dL (ref 6–23)
CHLORIDE: 97 meq/L (ref 96–112)
CO2: 28 meq/L (ref 19–32)
Calcium: 9.2 mg/dL (ref 8.4–10.5)
Creatinine, Ser: 1.01 mg/dL (ref 0.50–1.35)
GFR calc Af Amer: >90 mL/min (ref >90)
GFR calc non Af Amer: >90 mL/min (ref >90)
Glucose, Bld: 276 mg/dL — ABNORMAL HIGH (ref 70–99)
Potassium: 4.1 mEq/L (ref 3.7–5.3)
SODIUM: 135 meq/L — AB (ref 137–147)

## 2014-06-24 LAB — GLUCOSE, CAPILLARY: Glucose-Capillary: 210 mg/dL — ABNORMAL HIGH (ref 70–99)

## 2014-06-24 MED ORDER — LISINOPRIL 10 MG PO TABS: 10.0000 mg | ORAL_TABLET | Freq: Every day | ORAL | Status: DC

## 2014-06-24 MED ORDER — SIMVASTATIN 20 MG PO TABS: 20.0000 mg | ORAL_TABLET | Freq: Every evening | ORAL | Status: DC

## 2014-06-24 MED ORDER — OXYCODONE-ACETAMINOPHEN 7.5-325 MG PO TABS: 1.0000 | ORAL_TABLET | ORAL | Status: DC | PRN

## 2014-06-24 MED ORDER — INSULIN DETEMIR 100 UNIT/ML ~~LOC~~ SOLN
50.0000 [IU] | Freq: Two times a day (BID) | SUBCUTANEOUS | Status: DC
  Filled 2014-06-24 (×2): qty 0.5

## 2014-06-24 NOTE — Discharge Summary (Signed)
Physician Discharge Summary  Nathaniel [name] [id number] DOB: 04-12-1986 DOA: 06/21/2014  PCP: Maricela Curet, MD  Admit date: 06/21/2014 Discharge date: 06/24/2014   Recommendations for Outpatient Follow-up:  Followup one week's time for assessment of glycemic control and abscess it Cipro 500 mg by mouth twice a day for 10 days as well as Flagyl 5 mg by mouth 3 times a day for 10 days to discharge medicine list Discharge Diagnoses:  Active Problems:   Abscess, gluteal, right   Discharge Condition: Good  Filed Weights   06/21/14 0018 06/21/14 0451  Weight: 150 lb (68.04 kg) 156 lb 15.5 oz (71.2 kg)    History of present illness:  Insulin-dependent diabetic young male presents with gluteal/perirectal abscess which was already draining upon discharge fair amount of pain requiring opioid analgesia and dual intravenous antibiotics for a period of 5 days seen in consultation by surgery continued to improve and was discharged on dual oral antibiotics for a period of 10 days  Hospital Course:  Patient continued to improve with IV antibiotics and IV fluids analgesia was switched to by mouth continuation for 10 days on discharge  Procedures:  None  Consultations:  Surgery  Discharge Instructions     Medication List    STOP taking these medications       ibuprofen 200 MG tablet  Commonly known as:  ADVIL,MOTRIN     insulin aspart 100 UNIT/ML injection  Commonly known as:  novoLOG      TAKE these medications       insulin detemir 100 unit/ml Soln  Commonly known as:  LEVEMIR  Inject 60-70 Units into the skin 2 (two) times daily. 60 units in the morning and 70 in the evening.     lisinopril 10 MG tablet  Commonly known as:  PRINIVIL,ZESTRIL  Take 1 tablet (10 mg total) by mouth daily.     naproxen sodium 220 MG tablet  Commonly known as:  ANAPROX  Take 440 mg by mouth daily as needed (pain).     oxyCODONE-acetaminophen 7.5-325 MG per tablet  Commonly  known as:  PERCOCET  Take 1 tablet by mouth every 4 (four) hours as needed for pain.     simvastatin 20 MG tablet  Commonly known as:  ZOCOR  Take 1 tablet (20 mg total) by mouth every evening.       Allergies  Allergen Reactions  . Orange     Increases blood sugar immediately   . Lisinopril Other (See Comments)    convulsions      The results of significant diagnostics from this hospitalization (including imaging, microbiology, ancillary and laboratory) are listed below for reference.    Significant Diagnostic Studies: Ct Pelvis W Contrast  06/21/2014   CLINICAL DATA:  Right gluteal abscess  EXAM: CT PELVIS WITH CONTRAST  TECHNIQUE: Multidetector CT imaging of the pelvis was performed using the standard protocol following the bolus administration of intravenous contrast.  CONTRAST:  110mL OMNIPAQUE IOHEXOL 300 MG/ML  SOLN  COMPARISON:  CT abdomen pelvis dated 01/09/2014  FINDINGS: 4.1 x 3.3 x 3.4 cm fluid and gas collection in the right gluteal cleft, corresponding to the clinical abnormality. This does not reflect a well-defined fluid collection/drainable abscess. Medially, this is within 2-3 mm of the skin surface (series 3/image 40). No definite perirectal communication.  No pelvic ascites.  No suspicious pelvic lymphadenopathy.  Prostate is unremarkable.  Bladder is within normal limits.  Visualized osseous structures are within normal limits.  IMPRESSION:  4.1 cm ill-defined fluid and gas collection in the right gluteal cleft, corresponding to the clinical abnormality, within 2-3 mm of the skin surface.   Electronically Signed   By: Julian Hy M.D.   On: 06/21/2014 02:56    Microbiology: Recent Results (from the past 240 hour(s))  WOUND CULTURE     Status: None   Collection Time    06/21/14  6:40 AM      Result Value Ref Range Status   Specimen Description OTHER   Final   Special Requests NONE   Final   Gram Stain     Final   Value: ABUNDANT WBC PRESENT,BOTH PMN AND  MONONUCLEAR     RARE SQUAMOUS EPITHELIAL CELLS PRESENT     ABUNDANT GRAM NEGATIVE RODS     FEW GRAM POSITIVE COCCI     IN PAIRS IN CLUSTERS   Culture     Final   Value: FEW GROUP B STREP(S.AGALACTIAE)ISOLATED     Note: TESTING AGAINST S. AGALACTIAE NOT ROUTINELY PERFORMED DUE TO PREDICTABILITY OF AMP/PEN/VAN SUSCEPTIBILITY.     Performed at [facility]   Report Status 06/23/2014 FINAL   Final     Labs: Basic Metabolic Panel:  Recent Labs Lab 06/21/14 0135 06/21/14 0835 06/22/14 0552 06/23/14 0545 06/24/14 0605  NA 136* 141 141 136* 135*  K 4.4 3.2* 4.0 4.3 4.1  CL 96 99 100 97 97  CO2 30 29 31 29 28   GLUCOSE 409* 53* 103* 240* 276*  BUN 14 11 6 8 11   CREATININE 0.89 0.85 0.90 0.93 1.01  CALCIUM 9.0 9.5 8.9 9.1 9.2   Liver Function Tests: No results found for this basename: AST, ALT, ALKPHOS, BILITOT, PROT, ALBUMIN,  in the last 168 hours No results found for this basename: LIPASE, AMYLASE,  in the last 168 hours No results found for this basename: AMMONIA,  in the last 168 hours CBC:  Recent Labs Lab 06/21/14 0135  WBC 14.7*  NEUTROABS 10.8*  HGB 10.8*  HCT 31.9*  MCV 89.6  PLT 325   Cardiac Enzymes: No results found for this basename: CKTOTAL, CKMB, CKMBINDEX, TROPONINI,  in the last 168 hours BNP: BNP (last 3 results) No results found for this basename: PROBNP,  in the last 8760 hours CBG:  Recent Labs Lab 06/23/14 0748 06/23/14 1126 06/23/14 1625 06/23/14 2037 06/24/14 0837  GLUCAP 190* 102* 163* 179* 210*       Signed:  Jazzalyn Loewenstein M  [facility] Pager: [phone] 06/24/2014, 11:29 AM

## 2014-06-24 NOTE — Care Management Utilization Note (Signed)
UR completed 

## 2014-07-29 ENCOUNTER — Encounter (HOSPITAL_COMMUNITY): Payer: Self-pay

## 2014-07-29 ENCOUNTER — Emergency Department (HOSPITAL_COMMUNITY)
Admission: EM | Admit: 2014-07-29 | Discharge: 2014-07-29 | Disposition: A | Discharge status: Home / Self Care | Payer: Medicare Other | Attending: Emergency Medicine | Admitting: Emergency Medicine

## 2014-07-29 ENCOUNTER — Emergency Department (HOSPITAL_COMMUNITY): Payer: Medicare Other

## 2014-07-29 DIAGNOSIS — S53401A Unspecified sprain of right elbow, initial encounter: Secondary | ICD-10-CM | POA: Diagnosis not present

## 2014-07-29 DIAGNOSIS — Y92410 Unspecified street and highway as the place of occurrence of the external cause: Secondary | ICD-10-CM | POA: Insufficient documentation

## 2014-07-29 DIAGNOSIS — Z79899 Other long term (current) drug therapy: Secondary | ICD-10-CM | POA: Insufficient documentation

## 2014-07-29 DIAGNOSIS — S59901A Unspecified injury of right elbow, initial encounter: Secondary | ICD-10-CM | POA: Diagnosis present

## 2014-07-29 DIAGNOSIS — E1165 Type 2 diabetes mellitus with hyperglycemia: Secondary | ICD-10-CM | POA: Insufficient documentation

## 2014-07-29 DIAGNOSIS — Y998 Other external cause status: Secondary | ICD-10-CM | POA: Insufficient documentation

## 2014-07-29 DIAGNOSIS — Z794 Long term (current) use of insulin: Secondary | ICD-10-CM | POA: Insufficient documentation

## 2014-07-29 DIAGNOSIS — Y9389 Activity, other specified: Secondary | ICD-10-CM | POA: Diagnosis not present

## 2014-07-29 DIAGNOSIS — Z720 Tobacco use: Secondary | ICD-10-CM | POA: Diagnosis not present

## 2014-07-29 DIAGNOSIS — R739 Hyperglycemia, unspecified: Secondary | ICD-10-CM

## 2014-07-29 LAB — CBG MONITORING, ED: GLUCOSE-CAPILLARY: 315 mg/dL — AB (ref 70–99)

## 2014-07-29 MED ORDER — OXYCODONE-ACETAMINOPHEN 5-325 MG PO TABS
1.0000 | ORAL_TABLET | Freq: Once | ORAL | Status: AC
  Administered 2014-07-29: 1 via ORAL
  Filled 2014-07-29: qty 1

## 2014-07-29 MED ORDER — IBUPROFEN 800 MG PO TABS
800.0000 mg | ORAL_TABLET | Freq: Once | ORAL | Status: AC
  Administered 2014-07-29: 800 mg via ORAL
  Filled 2014-07-29: qty 2

## 2014-07-29 NOTE — Discharge Instructions (Signed)
Elbow Exercises EXERCISES RANGE OF MOTION (ROM) AND STRETCHING EXERCISES  These exercises may help you when beginning to rehabilitate your injury. Your symptoms may go away with or without further involvement from your physician, physical therapist or athletic trainer. While completing these exercises, remember:   Restoring tissue flexibility helps normal motion to return to the joints. This allows healthier, less painful movement and activity.  An effective stretch should be held for at least 30 seconds.  A stretch should never be painful. You should only feel a gentle lengthening or release in the stretched tissue. RANGE OF MOTION - Extension  Hold your right / left arm at your side and straighten your elbow as far as you can, using your right / left arm muscles.  Straighten the elbow farther by gently pushing down on your forearm until you feel a gentle stretch on the inside of your elbow. Hold this position for __________ seconds.  Slowly return to the starting position. Repeat __________ times. Complete this exercise __________ times per day.  RANGE OF MOTION - Flexion  Hold your right / left arm at your side and bend your elbow as far as you can using your arm muscles.  Bend the right / left elbow farther by gently pushing up on your forearm until you feel a gentle stretch on the outside of your elbow. Hold this position for __________ seconds.  Slowly return to the starting position. Repeat __________ times. Complete this exercise __________ times per day.  RANGE OF MOTION - Supination, Active  Stand or sit with your elbows at your side. Bend your right / left elbow to 90 degrees.  Turn your palm upward until you feel a gentle stretch on the inside of your forearm.  Hold this position for __________ seconds. Slowly release and return to the starting position. Repeat __________ times. Complete this stretch __________ times per day.  RANGE OF MOTION - Pronation, Active  Stand  or sit with your elbows at your side. Bend your right / left elbow to 90 degrees.  Turn your palm downward until you feel a gentle stretch on the top of your forearm.  Hold this position for __________ seconds. Slowly release and return to the starting position. Repeat __________ times. Complete this stretch __________ times per day.  STRETCH - Elbow Flexors  Lie on a firm bed or countertop, on your back. Be sure that you are in a comfortable position which will allow you to relax your arm muscles.  Place a folded towel under your right / left upper arm, so that your elbow and shoulder are at the same height. Extend your arm; your elbow should not rest on the bed or towel  Allow the weight of your hand to straighten your elbow. Keep your arm and chest muscles relaxed. Your caregiver may ask you to increase the intensity of your stretch by adding a small wrist or hand weight.  Hold for __________ seconds. You should feel a stretch on the inside of your elbow. Slowly return to the starting position. Repeat __________ times. Complete this exercise __________ times per day. STRENGTHENING EXERCISES  These exercises may help you when beginning to rehabilitate your injury. They may resolve your symptoms with or without further involvement from your physician, physical therapist or athletic trainer. While completing these exercises, remember:   Muscles can gain both the endurance and the strength needed for everyday activities through controlled exercises.  Complete these exercises as instructed by your physician, physical therapist or athletic  trainer. Increase the resistance and repetitions only as guided.  You may experience muscle soreness or fatigue, but the pain or discomfort you are trying to eliminate should never worsen during these exercises. If this pain does get worse, stop and make sure you are following the directions exactly. If the pain is still present after adjustments, discontinue  the exercise until you can discuss the trouble with your caregiver. STRENGTH - Elbow Flexors, Isometric   Stand or sit upright on a firm surface. Place your right / left arm so that your hand is palm-up and at the height of your waist.  Place your opposite hand on top of your forearm. Gently push down as your right / left arm resists. Push as hard as you can with both arms without causing any pain or movement at your right / left elbow. Hold this stationary position for __________ seconds.  Gradually release the tension in both arms. Allow your muscles to relax completely before repeating. Repeat __________ times. Complete this exercise __________ times per day. STRENGTH - Elbow Extensors, Isometric  Stand or sit upright on a firm surface. Place your right / left arm so that your palm faces your abdomen and it is at the height of your waist.  Place your opposite hand on the underside of your forearm. Gently push up as your right / left arm resists. Push as hard as you can with both arms without causing any pain or movement at your right / left elbow. Hold this stationary position for __________ seconds.  Gradually release the tension in both arms. Allow your muscles to relax completely before repeating. Repeat __________ times. Complete this exercise __________ times per day. STRENGTH - Elbow Flexors, Supinated  With good posture, stand, or sit on a firm chair without armrests. Allow your right / left arm to rest at your side with your palm facing forward.  Holding a __________ weight, or gripping a rubber exercise band or tubing, bring your hand toward your shoulder.  Allow your muscles to control the resistance, as your hand returns to your side. Repeat __________ times. Complete this exercise __________ times per day.  STRENGTH - Elbow Extensors, Dynamic  With good posture, stand, or sit on a firm chair without armrests. Keeping your upper arms at your side, bring both hands up toward  your right / left shoulder while gripping a rubber exercise band or tubing. Your right / left hand should be just below the other hand.  Straighten your right / left elbow. Hold for __________ seconds.  Allow your muscles to control the rubber exercise band, as your hand returns to your shoulder. Repeat __________ times. Complete this exercise __________ times per day. STRENGTH - Forearm Supinators   Sit with your right / left forearm supported on a table, keeping your elbow below shoulder height. Rest your hand over the edge, palm down.  Gently grip a hammer or a soup ladle.  Without moving your elbow, slowly turn your palm and hand upward to a "thumbs-up" position.  Hold this position for __________ seconds. Slowly return to the starting position. Repeat __________ times. Complete this exercise __________ times per day.  STRENGTH - Forearm Pronators  Sit with your right / left forearm supported on a table, keeping your elbow below shoulder height. Rest your hand over the edge, palm up.  Gently grip a hammer or a soup ladle.  Without moving your elbow, slowly turn your palm and hand upward to a "thumbs-up" position.  Hold this  position for __________ seconds. Slowly return to the starting position. Repeat __________ times. Complete this exercise __________ times per day. Document Released: 07/21/2005 Document Revised: 11/29/2011 Document Reviewed: 12/19/2008 [facility] Patient Information 2015 [place], [place]. This information is not intended to replace advice given to you by your health care provider. Make sure you discuss any questions you have with your health care provider.
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
## 2014-07-29 NOTE — ED Notes (Signed)
Dr. Wickline in with pt at this time 
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
## 2014-12-01 ENCOUNTER — Encounter (HOSPITAL_COMMUNITY): Payer: Self-pay | Admitting: Emergency Medicine

## 2014-12-01 ENCOUNTER — Emergency Department (HOSPITAL_COMMUNITY)
Admission: EM | Admit: 2014-12-01 | Discharge: 2014-12-01 | Disposition: A | Discharge status: Home / Self Care | Payer: Medicare Other | Attending: Emergency Medicine | Admitting: Emergency Medicine

## 2014-12-01 DIAGNOSIS — Z720 Tobacco use: Secondary | ICD-10-CM | POA: Diagnosis not present

## 2014-12-01 DIAGNOSIS — R112 Nausea with vomiting, unspecified: Secondary | ICD-10-CM

## 2014-12-01 DIAGNOSIS — R197 Diarrhea, unspecified: Secondary | ICD-10-CM | POA: Insufficient documentation

## 2014-12-01 DIAGNOSIS — E860 Dehydration: Secondary | ICD-10-CM

## 2014-12-01 DIAGNOSIS — R000 Tachycardia, unspecified: Secondary | ICD-10-CM | POA: Insufficient documentation

## 2014-12-01 DIAGNOSIS — Z79899 Other long term (current) drug therapy: Secondary | ICD-10-CM | POA: Insufficient documentation

## 2014-12-01 DIAGNOSIS — Z794 Long term (current) use of insulin: Secondary | ICD-10-CM | POA: Insufficient documentation

## 2014-12-01 DIAGNOSIS — E119 Type 2 diabetes mellitus without complications: Secondary | ICD-10-CM | POA: Diagnosis not present

## 2014-12-01 DIAGNOSIS — R6883 Chills (without fever): Secondary | ICD-10-CM | POA: Insufficient documentation

## 2014-12-01 DIAGNOSIS — R109 Unspecified abdominal pain: Secondary | ICD-10-CM | POA: Insufficient documentation

## 2014-12-01 LAB — CBC WITH DIFFERENTIAL/PLATELET
BASOS PCT: 0 % (ref 0–1)
Basophils Absolute: 0 10*3/uL (ref 0.0–0.1)
Eosinophils Absolute: 0 10*3/uL (ref 0.0–0.7)
Eosinophils Relative: 0 % (ref 0–5)
HCT: 41.9 % (ref 39.0–52.0)
Hemoglobin: 14.1 g/dL (ref 13.0–17.0)
LYMPHS PCT: 6 % — AB (ref 12–46)
Lymphs Abs: 0.8 10*3/uL (ref 0.7–4.0)
MCH: 30.4 pg (ref 26.0–34.0)
MCHC: 33.7 g/dL (ref 30.0–36.0)
MCV: 90.3 fL (ref 78.0–100.0)
Monocytes Absolute: 0.6 10*3/uL (ref 0.1–1.0)
Monocytes Relative: 4 % (ref 3–12)
Neutro Abs: 11.8 10*3/uL — ABNORMAL HIGH (ref 1.7–7.7)
Neutrophils Relative %: 90 % — ABNORMAL HIGH (ref 43–77)
Platelets: 205 10*3/uL (ref 150–400)
RBC: 4.64 MIL/uL (ref 4.22–5.81)
RDW: 14.1 % (ref 11.5–15.5)
WBC: 13.3 10*3/uL — ABNORMAL HIGH (ref 4.0–10.5)

## 2014-12-01 LAB — BASIC METABOLIC PANEL
ANION GAP: 9 (ref 5–15)
BUN: 15 mg/dL (ref 6–23)
CALCIUM: 9.4 mg/dL (ref 8.4–10.5)
CO2: 27 mmol/L (ref 19–32)
CREATININE: 1.04 mg/dL (ref 0.50–1.35)
Chloride: 105 mmol/L (ref 96–112)
GFR calc Af Amer: >90 mL/min (ref >90)
GFR calc non Af Amer: >90 mL/min (ref >90)
Glucose, Bld: 81 mg/dL (ref 70–99)
Potassium: 3.8 mmol/L (ref 3.5–5.1)
SODIUM: 141 mmol/L (ref 135–145)

## 2014-12-01 LAB — CBG MONITORING, ED: GLUCOSE-CAPILLARY: 76 mg/dL (ref 70–99)

## 2014-12-01 MED ORDER — SODIUM CHLORIDE 0.9 % IV BOLUS (SEPSIS)
1000.0000 mL | Freq: Once | INTRAVENOUS | Status: AC
  Administered 2014-12-01: 1000 mL via INTRAVENOUS

## 2014-12-01 MED ORDER — ACETAMINOPHEN 325 MG PO TABS
650.0000 mg | ORAL_TABLET | Freq: Once | ORAL | Status: AC
  Administered 2014-12-01: 650 mg via ORAL
  Filled 2014-12-01: qty 2

## 2014-12-01 MED ORDER — METOCLOPRAMIDE HCL 5 MG/ML IJ SOLN
10.0000 mg | Freq: Once | INTRAMUSCULAR | Status: AC
  Administered 2014-12-01: 10 mg via INTRAVENOUS
  Filled 2014-12-01: qty 2

## 2014-12-01 MED ORDER — KETOROLAC TROMETHAMINE 30 MG/ML IJ SOLN
15.0000 mg | Freq: Once | INTRAMUSCULAR | Status: AC
  Administered 2014-12-01: 15 mg via INTRAVENOUS
  Filled 2014-12-01: qty 1

## 2014-12-01 MED ORDER — ONDANSETRON HCL 4 MG/2ML IJ SOLN
4.0000 mg | Freq: Once | INTRAMUSCULAR | Status: AC
Start: 2014-12-01 — End: 2014-12-01
  Administered 2014-12-01: 4 mg via INTRAVENOUS
  Filled 2014-12-01: qty 2

## 2014-12-01 NOTE — ED Notes (Signed)
Pt c/o n/v/d that started this am, Dr Reather Converse in prior to RN, see edp assessment for further,

## 2014-12-01 NOTE — ED Notes (Signed)
Pt c/o emesis, diarrhea and abdominal pain that started this am.

## 2014-12-01 NOTE — ED Provider Notes (Signed)
CSN: [id number]     Arrival date & time 12/01/14  1700 History  This chart was scribed for Elnora Morrison, MD by Chester Holstein, ED Scribe. This patient was seen in room APA09/APA09 and the patient's care was started at 5:38 PM.    Chief Complaint  Patient presents with  . Abdominal Pain      Patient is a 29 y.o. male presenting with abdominal pain. The history is provided by the patient and a parent. No language interpreter was used.  Abdominal Pain Associated symptoms: chills, diarrhea, nausea and vomiting   Associated symptoms: no dysuria and no fever     HPI Comments: Nathaniel [name] is a 29 y.o. male with PMHx of IDDM who presents to the Emergency Department complaining of abdominal pain with onset his morning.  Pt notes associated vomiting, chills, and diarrhea. Mother notes CBG was 31. Pt is a smoker 1ppd. Pt denies fever and urinary symptoms.   Past Medical History  Diagnosis Date  . Diabetes mellitus     lantus/novolog  . Tobacco abuse     5/day  . Marijuana abuse     occaisionally   Past Surgical History  Procedure Laterality Date  . Eye surgery     Family History  Problem Relation Age of Onset  . Diabetes Father   . Hypertension Mother   . Kidney failure Father     last 4 mnths of life   History  Substance Use Topics  . Smoking status: Current Every Day Smoker -- 1.00 packs/day for 8 years    Types: Cigarettes  . Smokeless tobacco: Never Used  . Alcohol Use: No    Review of Systems  Constitutional: Positive for chills. Negative for fever.  Gastrointestinal: Positive for nausea, vomiting, abdominal pain and diarrhea.  Genitourinary: Negative for dysuria and frequency.  All other systems reviewed and are negative.     Allergies  Orange and Lisinopril  Home Medications   Prior to Admission medications   Medication Sig Start Date End Date Taking? Authorizing Provider  insulin detemir (LEVEMIR) 100 unit/ml SOLN Inject 50-70 Units into the skin 2  (two) times daily. 70 units in the morning and 50 in the evening.   Yes Historical Provider, MD  lisinopril (PRINIVIL,ZESTRIL) 10 MG tablet Take 1 tablet (10 mg total) by mouth daily. Patient not taking: Reported on 12/01/2014 06/24/14   [name], MD  oxyCODONE-acetaminophen (PERCOCET) 7.5-325 MG per tablet Take 1 tablet by mouth every 4 (four) hours as needed for pain. Patient not taking: Reported on 12/01/2014 06/24/14   [name], MD  simvastatin (ZOCOR) 20 MG tablet Take 1 tablet (20 mg total) by mouth every evening. Patient not taking: Reported on 12/01/2014 06/24/14   [name], MD   BP 174/90 mmHg  Pulse 100  Temp(Src) 100.2 F (37.9 C) (Oral)  Resp 18  Ht 6\' 1"  (1.854 m)  Wt 154 lb (69.854 kg)  BMI 20.32 kg/m2  SpO2 100% Physical Exam  Constitutional: He is oriented to person, place, and time. He appears well-developed and well-nourished.  HENT:  Head: Normocephalic.  Mouth/Throat: Mucous membranes are dry.  Eyes: Conjunctivae are normal.  Neck: Normal range of motion. Neck supple.  Cardiovascular: Regular rhythm and normal heart sounds.  Tachycardia present.   mildl tachycardia  Pulmonary/Chest: Effort normal and breath sounds normal. No respiratory distress.  Abdominal: Soft. There is no tenderness.  Musculoskeletal: Normal range of motion.  Neurological: He is alert and oriented to person, place, and  time.  Skin: Skin is warm and dry.  Psychiatric: He has a normal mood and affect. His behavior is normal.  Nursing note and vitals reviewed.   ED Course  Procedures (including critical care time) DIAGNOSTIC STUDIES: Oxygen Saturation is 100% on room air, normal by my interpretation.    COORDINATION OF CARE: 5:43 PM Discussed treatment plan with patient at beside, the patient agrees with the plan and has no further questions at this time.   Labs Review Labs Reviewed  CBC WITH DIFFERENTIAL/PLATELET - Abnormal; Notable for the following:    WBC 13.3  (*)    Neutrophils Relative % 90 (*)    Neutro Abs 11.8 (*)    Lymphocytes Relative 6 (*)    All other components within normal limits  BASIC METABOLIC PANEL  CBG MONITORING, ED    Imaging Review No results found.   EKG Interpretation None      MDM   Final diagnoses:  Nausea vomiting and diarrhea  Dehydration  Abdominal cramping   I personally performed the services described in this documentation, which was scribed in my presence. The recorded information has been reviewed and is accurate.  Patient presents with abdominal cramping diffuse vomiting diarrhea and clinical dehydration. Glucose mildly low.   Plan for IV fluids, pain meds and antiemetics. Pt improved on recheck. Results and differential diagnosis were discussed with the patient/parent/guardian. Close follow up outpatient was discussed, comfortable with the plan.   Medications  sodium chloride 0.9 % bolus 1,000 mL (0 mLs Intravenous Stopped 12/01/14 1923)  metoCLOPramide (REGLAN) injection 10 mg (10 mg Intravenous Given 12/01/14 1827)  ketorolac (TORADOL) 30 MG/ML injection 15 mg (15 mg Intravenous Given 12/01/14 1820)  sodium chloride 0.9 % bolus 1,000 mL (0 mLs Intravenous Stopped 12/01/14 2100)  ondansetron (ZOFRAN) injection 4 mg (4 mg Intravenous Given 12/01/14 1931)  acetaminophen (TYLENOL) tablet 650 mg (650 mg Oral Given 12/01/14 2010)    Filed Vitals:   12/01/14 1702 12/01/14 1825 12/01/14 1900 12/01/14 2003  BP: 161/96 153/102 147/80 174/90  Pulse: 104 92 92 100  Temp: 98.7 F (37.1 C) 98.7 F (37.1 C)  100.2 F (37.9 C)  TempSrc: Oral Oral  Oral  Resp: 18 17  18   Height: 6\' 1"  (1.854 m)     Weight: 154 lb (69.854 kg)     SpO2: 100% 100% 97% 100%    Final diagnoses:  Nausea vomiting and diarrhea  Dehydration  Abdominal cramping      Elnora Morrison, MD 12/01/14 2234

## 2014-12-01 NOTE — ED Notes (Signed)
Pt tolerating po fluids and ice chips,

## 2014-12-01 NOTE — Discharge Instructions (Signed)
If you were given medicines take as directed.  If you are on coumadin or contraceptives realize their levels and effectiveness is altered by many different medicines.  If you have any reaction (rash, tongues swelling, other) to the medicines stop taking and see a physician.   Please follow up as directed and return to the ER or see a physician for new or worsening symptoms.  Thank you. Filed Vitals:   12/01/14 1702  BP: 161/96  Pulse: 104  Temp: 98.7 F (37.1 C)  TempSrc: Oral  Resp: 18  Height: 6\' 1"  (1.854 m)  Weight: 154 lb (69.854 kg)  SpO2: 100%

## 2014-12-01 NOTE — ED Notes (Signed)
cbg of 76.

## 2015-04-01 ENCOUNTER — Encounter (HOSPITAL_COMMUNITY): Payer: Self-pay | Admitting: Emergency Medicine

## 2015-04-01 ENCOUNTER — Emergency Department (HOSPITAL_COMMUNITY): Payer: Medicare Other

## 2015-04-01 ENCOUNTER — Inpatient Hospital Stay (HOSPITAL_COMMUNITY)
Admission: EM | Admit: 2015-04-01 | Discharge: 2015-04-04 | DRG: 074 | Disposition: A | Discharge status: Home / Self Care | Payer: Medicare Other | Attending: Family Medicine | Admitting: Family Medicine

## 2015-04-01 DIAGNOSIS — Z8249 Family history of ischemic heart disease and other diseases of the circulatory system: Secondary | ICD-10-CM

## 2015-04-01 DIAGNOSIS — Z833 Family history of diabetes mellitus: Secondary | ICD-10-CM

## 2015-04-01 DIAGNOSIS — R739 Hyperglycemia, unspecified: Secondary | ICD-10-CM

## 2015-04-01 DIAGNOSIS — Z720 Tobacco use: Secondary | ICD-10-CM

## 2015-04-01 DIAGNOSIS — I10 Essential (primary) hypertension: Secondary | ICD-10-CM | POA: Diagnosis not present

## 2015-04-01 DIAGNOSIS — E860 Dehydration: Secondary | ICD-10-CM | POA: Diagnosis present

## 2015-04-01 DIAGNOSIS — K3184 Gastroparesis: Secondary | ICD-10-CM | POA: Diagnosis not present

## 2015-04-01 DIAGNOSIS — F17200 Nicotine dependence, unspecified, uncomplicated: Secondary | ICD-10-CM | POA: Diagnosis present

## 2015-04-01 DIAGNOSIS — R1115 Cyclical vomiting syndrome unrelated to migraine: Secondary | ICD-10-CM

## 2015-04-01 DIAGNOSIS — E785 Hyperlipidemia, unspecified: Secondary | ICD-10-CM | POA: Diagnosis present

## 2015-04-01 DIAGNOSIS — E1065 Type 1 diabetes mellitus with hyperglycemia: Secondary | ICD-10-CM | POA: Diagnosis present

## 2015-04-01 DIAGNOSIS — R1110 Vomiting, unspecified: Secondary | ICD-10-CM

## 2015-04-01 DIAGNOSIS — F1210 Cannabis abuse, uncomplicated: Secondary | ICD-10-CM | POA: Diagnosis present

## 2015-04-01 DIAGNOSIS — E1043 Type 1 diabetes mellitus with diabetic autonomic (poly)neuropathy: Secondary | ICD-10-CM | POA: Diagnosis not present

## 2015-04-01 DIAGNOSIS — E1010 Type 1 diabetes mellitus with ketoacidosis without coma: Secondary | ICD-10-CM

## 2015-04-01 DIAGNOSIS — F17210 Nicotine dependence, cigarettes, uncomplicated: Secondary | ICD-10-CM | POA: Diagnosis present

## 2015-04-01 DIAGNOSIS — Z794 Long term (current) use of insulin: Secondary | ICD-10-CM

## 2015-04-01 DIAGNOSIS — R112 Nausea with vomiting, unspecified: Secondary | ICD-10-CM | POA: Diagnosis present

## 2015-04-01 DIAGNOSIS — E108 Type 1 diabetes mellitus with unspecified complications: Secondary | ICD-10-CM

## 2015-04-01 HISTORY — DX: Hyperlipidemia, unspecified: E78.5

## 2015-04-01 HISTORY — DX: Essential (primary) hypertension: I10

## 2015-04-01 HISTORY — DX: Patient's other noncompliance with medication regimen for other reason: Z91.148

## 2015-04-01 HISTORY — DX: Patient's other noncompliance with medication regimen: Z91.14

## 2015-04-01 LAB — BASIC METABOLIC PANEL
ANION GAP: 13 (ref 5–15)
BUN: 22 mg/dL — ABNORMAL HIGH (ref 6–20)
CHLORIDE: 99 mmol/L — AB (ref 101–111)
CO2: 25 mmol/L (ref 22–32)
Calcium: 10.3 mg/dL (ref 8.9–10.3)
Creatinine, Ser: 1.22 mg/dL (ref 0.61–1.24)
GFR calc non Af Amer: >60 mL/min (ref >60)
Glucose, Bld: 426 mg/dL — ABNORMAL HIGH (ref 65–99)
POTASSIUM: 4.1 mmol/L (ref 3.5–5.1)
SODIUM: 137 mmol/L (ref 135–145)

## 2015-04-01 LAB — URINALYSIS, ROUTINE W REFLEX MICROSCOPIC
Bilirubin Urine: NEGATIVE
Glucose, UA: >1000 mg/dL — AB
Leukocytes, UA: NEGATIVE
NITRITE: NEGATIVE
PH: 6.5 (ref 5.0–8.0)
PROTEIN: 100 mg/dL — AB
Specific Gravity, Urine: 1.015 (ref 1.005–1.030)
Urobilinogen, UA: 0.2 mg/dL (ref 0.0–1.0)

## 2015-04-01 LAB — CBC WITH DIFFERENTIAL/PLATELET
BASOS ABS: 0.1 10*3/uL (ref 0.0–0.1)
Basophils Relative: 0 % (ref 0–1)
EOS PCT: 2 % (ref 0–5)
Eosinophils Absolute: 0.2 10*3/uL (ref 0.0–0.7)
HCT: 40.8 % (ref 39.0–52.0)
Hemoglobin: 14.1 g/dL (ref 13.0–17.0)
LYMPHS ABS: 2.9 10*3/uL (ref 0.7–4.0)
Lymphocytes Relative: 19 % (ref 12–46)
MCH: 30.9 pg (ref 26.0–34.0)
MCHC: 34.6 g/dL (ref 30.0–36.0)
MCV: 89.5 fL (ref 78.0–100.0)
Monocytes Absolute: 0.5 10*3/uL (ref 0.1–1.0)
Monocytes Relative: 3 % (ref 3–12)
NEUTROS ABS: 11.6 10*3/uL — AB (ref 1.7–7.7)
NEUTROS PCT: 76 % (ref 43–77)
PLATELETS: 276 10*3/uL (ref 150–400)
RBC: 4.56 MIL/uL (ref 4.22–5.81)
RDW: 14.3 % (ref 11.5–15.5)
WBC: 15.3 10*3/uL — AB (ref 4.0–10.5)

## 2015-04-01 LAB — LACTIC ACID, PLASMA: Lactic Acid, Venous: 1.5 mmol/L (ref 0.5–2.0)

## 2015-04-01 LAB — URINE MICROSCOPIC-ADD ON

## 2015-04-01 LAB — HEPATIC FUNCTION PANEL
ALBUMIN: 4.2 g/dL (ref 3.5–5.0)
ALT: 27 U/L (ref 17–63)
AST: 28 U/L (ref 15–41)
Alkaline Phosphatase: 98 U/L (ref 38–126)
BILIRUBIN TOTAL: 1.3 mg/dL — AB (ref 0.3–1.2)
Bilirubin, Direct: 0.2 mg/dL (ref 0.1–0.5)
Indirect Bilirubin: 1.1 mg/dL — ABNORMAL HIGH (ref 0.3–0.9)
TOTAL PROTEIN: 8.4 g/dL — AB (ref 6.5–8.1)

## 2015-04-01 LAB — CBG MONITORING, ED: GLUCOSE-CAPILLARY: 435 mg/dL — AB (ref 65–99)

## 2015-04-01 LAB — LIPASE, BLOOD: LIPASE: 18 U/L — AB (ref 22–51)

## 2015-04-01 MED ORDER — INSULIN ASPART 100 UNIT/ML ~~LOC~~ SOLN
0.0000 [IU] | Freq: Three times a day (TID) | SUBCUTANEOUS | Status: DC
  Administered 2015-04-02: 3 [IU] via SUBCUTANEOUS
  Administered 2015-04-03: 8 [IU] via SUBCUTANEOUS
  Administered 2015-04-04: 3 [IU] via SUBCUTANEOUS

## 2015-04-01 MED ORDER — NICOTINE 21 MG/24HR TD PT24
21.0000 mg | MEDICATED_PATCH | Freq: Every day | TRANSDERMAL | Status: DC
  Administered 2015-04-02: 21 mg via TRANSDERMAL
  Filled 2015-04-01 (×3): qty 1

## 2015-04-01 MED ORDER — INSULIN DETEMIR 100 UNIT/ML ~~LOC~~ SOLN
35.0000 [IU] | Freq: Two times a day (BID) | SUBCUTANEOUS | Status: DC
  Administered 2015-04-02: 35 [IU] via SUBCUTANEOUS
  Filled 2015-04-01 (×4): qty 0.35

## 2015-04-01 MED ORDER — MORPHINE SULFATE 4 MG/ML IJ SOLN
4.0000 mg | INTRAMUSCULAR | Status: DC | PRN
  Administered 2015-04-02 – 2015-04-03 (×6): 4 mg via INTRAVENOUS
  Filled 2015-04-01 (×2): qty 1

## 2015-04-01 MED ORDER — HYDRALAZINE HCL 20 MG/ML IJ SOLN
5.0000 mg | INTRAMUSCULAR | Status: DC | PRN
  Filled 2015-04-01: qty 1

## 2015-04-01 MED ORDER — ONDANSETRON HCL 4 MG PO TABS: 4.0000 mg | ORAL_TABLET | Freq: Four times a day (QID) | ORAL | Status: DC | PRN

## 2015-04-01 MED ORDER — SODIUM CHLORIDE 0.9 % IV BOLUS (SEPSIS)
1000.0000 mL | Freq: Once | INTRAVENOUS | Status: AC
  Administered 2015-04-01: 1000 mL via INTRAVENOUS

## 2015-04-01 MED ORDER — HEPARIN SODIUM (PORCINE) 5000 UNIT/ML IJ SOLN
5000.0000 [IU] | Freq: Three times a day (TID) | INTRAMUSCULAR | Status: DC
  Administered 2015-04-02 – 2015-04-03 (×3): 5000 [IU] via SUBCUTANEOUS
  Filled 2015-04-01 (×4): qty 1

## 2015-04-01 MED ORDER — METOCLOPRAMIDE HCL 5 MG/ML IJ SOLN
10.0000 mg | Freq: Once | INTRAMUSCULAR | Status: AC
  Administered 2015-04-01: 10 mg via INTRAVENOUS
  Filled 2015-04-01: qty 2

## 2015-04-01 MED ORDER — SODIUM CHLORIDE 0.9 % IV SOLN
INTRAVENOUS | Status: AC
  Administered 2015-04-01: 21:00:00 via INTRAVENOUS

## 2015-04-01 MED ORDER — SODIUM CHLORIDE 0.9 % IV BOLUS (SEPSIS)
2000.0000 mL | Freq: Once | INTRAVENOUS | Status: AC
Start: 2015-04-01 — End: 2015-04-01
  Administered 2015-04-01: 2000 mL via INTRAVENOUS

## 2015-04-01 MED ORDER — ONDANSETRON HCL 4 MG/2ML IJ SOLN
4.0000 mg | INTRAMUSCULAR | Status: DC | PRN
  Administered 2015-04-01: 4 mg via INTRAVENOUS
  Filled 2015-04-01 (×2): qty 2

## 2015-04-01 MED ORDER — ONDANSETRON HCL 4 MG/2ML IJ SOLN: 4.0000 mg | Freq: Three times a day (TID) | INTRAMUSCULAR | Status: AC | PRN

## 2015-04-01 MED ORDER — LISINOPRIL 10 MG PO TABS
10.0000 mg | ORAL_TABLET | Freq: Every day | ORAL | Status: DC
  Administered 2015-04-02 – 2015-04-04 (×3): 10 mg via ORAL
  Filled 2015-04-01 (×3): qty 1

## 2015-04-01 MED ORDER — MORPHINE SULFATE 4 MG/ML IJ SOLN
4.0000 mg | INTRAMUSCULAR | Status: DC | PRN
  Administered 2015-04-01: 4 mg via INTRAVENOUS
  Filled 2015-04-01 (×5): qty 1

## 2015-04-01 MED ORDER — ACETAMINOPHEN 325 MG PO TABS: 650.0000 mg | ORAL_TABLET | Freq: Four times a day (QID) | ORAL | Status: DC | PRN

## 2015-04-01 MED ORDER — SODIUM CHLORIDE 0.9 % IV SOLN
INTRAVENOUS | Status: DC
  Administered 2015-04-03 (×2): via INTRAVENOUS

## 2015-04-01 MED ORDER — SODIUM CHLORIDE 0.9 % IV BOLUS (SEPSIS): 1000.0000 mL | Freq: Once | INTRAVENOUS | Status: DC

## 2015-04-01 MED ORDER — SODIUM CHLORIDE 0.9 % IJ SOLN
3.0000 mL | Freq: Two times a day (BID) | INTRAMUSCULAR | Status: DC
  Administered 2015-04-01 – 2015-04-04 (×3): 3 mL via INTRAVENOUS

## 2015-04-01 MED ORDER — METOCLOPRAMIDE HCL 5 MG/ML IJ SOLN
10.0000 mg | Freq: Three times a day (TID) | INTRAMUSCULAR | Status: DC
  Administered 2015-04-02 – 2015-04-04 (×7): 10 mg via INTRAVENOUS
  Filled 2015-04-01 (×7): qty 2

## 2015-04-01 MED ORDER — ONDANSETRON HCL 4 MG/2ML IJ SOLN
4.0000 mg | Freq: Four times a day (QID) | INTRAMUSCULAR | Status: DC | PRN
  Administered 2015-04-02 – 2015-04-03 (×4): 4 mg via INTRAVENOUS
  Filled 2015-04-01 (×4): qty 2

## 2015-04-01 MED ORDER — INSULIN DETEMIR 100 UNIT/ML FLEXPEN: 35.0000 [IU] | PEN_INJECTOR | Freq: Two times a day (BID) | SUBCUTANEOUS | Status: DC

## 2015-04-01 MED ORDER — ACETAMINOPHEN 650 MG RE SUPP: 650.0000 mg | Freq: Four times a day (QID) | RECTAL | Status: DC | PRN

## 2015-04-01 NOTE — H&P (Signed)
[facility] History and Physical  DIONEL CARAVALHO [id number] DOB: Sep 27, 1985 DOA: 04/01/2015  Referring physician: Dr Thurnell Garbe - [facility] PCP: Maricela Curet, MD   Chief Complaint: N/V  HPI: Nathaniel [name] is a 29 y.o. male  Acute onset n/v/d at 13:00 today. Rested w/o improvement. No meds were taken to improve condition. Symptoms were constant and getting worse. Pt states he can't "count the number of times I've had diarrhea and vomited." Abdominal pain is diffuse without localization. Nonbloody nonbilious emesis. Diarrhea is described as watery and nonbloody. Denies any change in medications recently. States he is compliant with his Levemir 70 units every morning. Denies recent antibiotics. Last marijuana use 2 days ago.  Review of Systems:  Constitutional:  No weight loss, night sweats, Fevers, chills, fatigue.  HEENT:  No headaches, Difficulty swallowing,Tooth/dental problems,Sore throat,  No sneezing, itching, ear ache, nasal congestion, post nasal drip,  Cardio-vascular:  No chest pain, Orthopnea, PND, swelling in lower extremities, anasarca, dizziness, palpitations  GI: Per HPI Resp:   No shortness of breath with exertion or at rest. No excess mucus, no productive cough, No non-productive cough, No coughing up of blood.No change in color of mucus.No wheezing.No chest wall deformity  Skin:  no rash or lesions.  GU:  no dysuria, change in color of urine, no urgency or frequency. No flank pain.  Musculoskeletal:   No joint pain or swelling. No decreased range of motion. No back pain.  Psych:  No change in mood or affect. No depression or anxiety. No memory loss.   Past Medical History  Diagnosis Date  . Diabetes mellitus     lantus/novolog  . Tobacco abuse     5/day  . Marijuana abuse     occaisionally  . Hypertension   . Hyperlipidemia   . Noncompliance with medication regimen    Past Surgical History  Procedure Laterality Date  . Eye surgery      Social History:  reports that he has been smoking Cigarettes.  He has a 8 pack-year smoking history. He has never used smokeless tobacco. He reports that he uses illicit drugs (Marijuana). He reports that he does not drink alcohol.  Allergies  Allergen Reactions  . Orange     Increases blood sugar immediately     Family History  Problem Relation Age of Onset  . Diabetes Father   . Hypertension Mother   . Kidney failure Father     last 4 mnths of life     Prior to Admission medications   Medication Sig Start Date End Date Taking? Authorizing Provider  ibuprofen (ADVIL,MOTRIN) 200 MG tablet Take 200 mg by mouth every 6 (six) hours as needed for mild pain or moderate pain.   Yes Historical Provider, MD  LEVEMIR FLEXTOUCH 100 UNIT/ML Pen INJECT 70UNITS SUBCUTNEOUSLY TWICE A DAY 03/17/15  Yes Historical Provider, MD   Physical Exam: Filed Vitals:   04/01/15 1900 04/01/15 2030 04/01/15 2100 04/01/15 2126  BP: 203/105 119/112 148/115   Pulse:  89 95   Temp:  98 F (36.7 C)  98 F (36.7 C)  TempSrc:  Oral  Oral  Resp: 18 15 19    Height:      Weight:      SpO2:  100% 88%     Wt Readings from Last 3 Encounters:  04/01/15 68.04 kg (150 lb)  12/01/14 69.854 kg (154 lb)  07/29/14 68.04 kg (150 lb)    General: Ill appearing  Eyes:  PERRL, normal lids,  irises & conjunctiva ENT: Dry mm Neck:  no LAD, masses or thyromegaly Cardiovascular:  RRR, no m/r/g. No LE edema. Telemetry:  SR, no arrhythmias  Respiratory:  CTA bilaterally, no w/r/r. Normal respiratory effort. Abdomen:  soft, ntnd Skin:  no rash or induration seen on limited exam Musculoskeletal:  grossly normal tone BUE/BLE Psychiatric:  grossly normal mood and affect, speech fluent and appropriate Neurologic:  grossly non-focal.          Labs on Admission:  Basic Metabolic Panel:  Recent Labs Lab 04/01/15 1715  NA 137  K 4.1  CL 99*  CO2 25  GLUCOSE 426*  BUN 22*  CREATININE 1.22  CALCIUM 10.3   Liver  Function Tests:  Recent Labs Lab 04/01/15 1715  AST 28  ALT 27  ALKPHOS 98  BILITOT 1.3*  PROT 8.4*  ALBUMIN 4.2    Recent Labs Lab 04/01/15 1715  LIPASE 18*   No results for input(s): AMMONIA in the last 168 hours. CBC:  Recent Labs Lab 04/01/15 1715  WBC 15.3*  NEUTROABS 11.6*  HGB 14.1  HCT 40.8  MCV 89.5  PLT 276   Cardiac Enzymes: No results for input(s): CKTOTAL, CKMB, CKMBINDEX, TROPONINI in the last 168 hours.  BNP (last 3 results) No results for input(s): BNP in the last 8760 hours.  ProBNP (last 3 results) No results for input(s): PROBNP in the last 8760 hours.  CBG:  Recent Labs Lab 04/01/15 1643  GLUCAP 435*    Radiological Exams on Admission: Dg Abd Acute W/chest  04/01/2015   CLINICAL DATA:  Abdominal pain, nausea, vomiting today  EXAM: DG ABDOMEN ACUTE W/ 1V CHEST  COMPARISON:  None.  FINDINGS: There is no evidence of dilated bowel loops or free intraperitoneal air. No radiopaque calculi or other significant radiographic abnormality is seen. Heart size and mediastinal contours are within normal limits. Both lungs are clear.  IMPRESSION: Negative abdominal radiographs.  No acute cardiopulmonary disease.   Electronically Signed   By: Lahoma Crocker M.D.   On: 04/01/2015 19:40     Assessment/Plan Active Problems:   Dehydration   Tobacco abuse   Hyperglycemia without ketosis   Essential hypertension, benign   Intractable nausea and vomiting   Gastroparesis   Type 1 diabetes mellitus with complication    N/V/D: Likely secondary to diabetic gastroparesis cannot rule out viral gastroenteritis. Afebrile vital signs stable. Lipase normal. WBC 15.3 concerning for possible infection versus stress reaction and hemoconcentration. Some improvement with IV hydration morphine Zofran and Reglan in ED. ABD film without acute process noted - Telemetry  - IVF  - Reglan  - Zofran - Lactic acid  - Improved diabetic control  Diabetes: CBG 486 on  admission. Patient states he is compliant with his Levemir 70 units BID. - SSI - A1c - Continue patient at half his home Levemir dose  Hypertension: Patient states that he stopped taking his blood pressure medicine because he was worried it was making her sugar low. Hypertensive on presentation.  Review of historical records show patient was on lisinopril back in 2015. Creatinine 1.22 on admission. - Restart lisinopril - Hydralazine PRN SBP >180 and DBP >100   Tobacco: 1ppd - nicotine patch   Code Status: Full (must indicate code status--if unknown or must be presumed, indicate so)  DVT Prophylaxis: hEP Family Communication: None Disposition Plan: pending improvement  MERRELL, DAVID Lenna Sciara, MD Family Medicine [facility] [email or web address] Password [email or web address]

## 2015-04-01 NOTE — ED Notes (Signed)
cbg of 329.

## 2015-04-01 NOTE — ED Notes (Signed)
MD McManus at bedside. 

## 2015-04-01 NOTE — ED Notes (Signed)
cbg of 287.

## 2015-04-01 NOTE — ED Notes (Signed)
PO fluids given and patient tolerating well.

## 2015-04-01 NOTE — ED Provider Notes (Signed)
CSN: [id number]     Arrival date & time 04/01/15  1638 History   First MD Initiated Contact with Patient 04/01/15 1710     Chief Complaint  Patient presents with  . Hyperglycemia  . Abdominal Pain  . Emesis  . Diarrhea      HPI Pt was seen at 1720. Per pt, c/o gradual onset and persistence of multiple intermittent episodes of N/V/D that began this morning.  Describes the stools as "watery." Has been associated with generalized abd "pain." Pt states he has not checked his CBG today, but took his insulin "sometime this morning." Denies CP/SOB, no back pain, no fevers, no black or blood in stools or emesis.     Past Medical History  Diagnosis Date  . Diabetes mellitus     lantus/novolog  . Tobacco abuse     5/day  . Marijuana abuse     occaisionally  . Hypertension   . Hyperlipidemia   . Noncompliance with medication regimen    Past Surgical History  Procedure Laterality Date  . Eye surgery     Family History  Problem Relation Age of Onset  . Diabetes Father   . Hypertension Mother   . Kidney failure Father     last 4 mnths of life   History  Substance Use Topics  . Smoking status: Current Every Day Smoker -- 1.00 packs/day for 8 years    Types: Cigarettes  . Smokeless tobacco: Never Used  . Alcohol Use: No    Review of Systems ROS: Statement: All systems negative except as marked or noted in the HPI; Constitutional: Negative for fever and chills. ; ; Eyes: Negative for eye pain, redness and discharge. ; ; ENMT: Negative for ear pain, hoarseness, nasal congestion, sinus pressure and sore throat. ; ; Cardiovascular: Negative for chest pain, palpitations, diaphoresis, dyspnea and peripheral edema. ; ; Respiratory: Negative for cough, wheezing and stridor. ; ; Gastrointestinal: +N/V/D, abd pain. Negative for blood in stool, hematemesis, jaundice and rectal bleeding. . ; ; Genitourinary: Negative for dysuria, flank pain and hematuria. ; ; Musculoskeletal: Negative for back  pain and neck pain. Negative for swelling and trauma.; ; Skin: Negative for pruritus, rash, abrasions, blisters, bruising and skin lesion.; ; Neuro: Negative for headache, lightheadedness and neck stiffness. Negative for weakness, altered level of consciousness , altered mental status, extremity weakness, paresthesias, involuntary movement, seizure and syncope.      Allergies  Orange and Lisinopril  Home Medications   Prior to Admission medications   Medication Sig Start Date End Date Taking? Authorizing Provider  ibuprofen (ADVIL,MOTRIN) 200 MG tablet Take 200 mg by mouth every 6 (six) hours as needed for mild pain or moderate pain.   Yes Historical Provider, MD  LEVEMIR FLEXTOUCH 100 UNIT/ML Pen INJECT 70UNITS SUBCUTNEOUSLY TWICE A DAY 03/17/15  Yes Historical Provider, MD   BP 203/105 mmHg  Pulse 80  Temp(Src) 97.7 F (36.5 C) (Oral)  Resp 18  Ht 6\' 1"  (1.854 m)  Wt 150 lb (68.04 kg)  BMI 19.79 kg/m2  SpO2 96% Physical Exam  1725: Physical examination:  Nursing notes reviewed; Vital signs and O2 SAT reviewed;  Constitutional: Well developed, Well nourished, Uncomfortable appearing.; Head:  Normocephalic, atraumatic; Eyes: EOMI, PERRL, No scleral icterus; ENMT: Mouth and pharynx normal, Mucous membranes dry; Neck: Supple, Full range of motion, No lymphadenopathy; Cardiovascular: Regular rate and rhythm, No gallop; Respiratory: Breath sounds clear & equal bilaterally, No wheezes.  Speaking full sentences with ease, Normal respiratory  effort/excursion; Chest: Nontender, Movement normal; Abdomen: Soft, +diffuse tenderness to palp. Nondistended, Normal bowel sounds; Genitourinary: No CVA tenderness; Extremities: Pulses normal, No tenderness, No edema, No calf edema or asymmetry.; Neuro: AA&Ox3, Major CN grossly intact.  Speech clear. No gross focal motor or sensory deficits in extremities.; Skin: Color normal, Warm, Dry.   ED Course  Procedures     EKG Interpretation None      MDM   MDM Reviewed: previous chart, nursing note and vitals Reviewed previous: labs Interpretation: labs      Results for orders placed or performed during the hospital encounter of [id number]  Basic metabolic panel  Result Value Ref Range   Sodium 137 135 - 145 mmol/L   Potassium 4.1 3.5 - 5.1 mmol/L   Chloride 99 (L) 101 - 111 mmol/L   CO2 25 22 - 32 mmol/L   Glucose, Bld 426 (H) 65 - 99 mg/dL   BUN 22 (H) 6 - 20 mg/dL   Creatinine, Ser 1.22 0.61 - 1.24 mg/dL   Calcium 10.3 8.9 - 10.3 mg/dL   GFR calc non Af Amer >60 >60 mL/min   GFR calc Af Amer >60 >60 mL/min   Anion gap 13 5 - 15  CBC with Differential  Result Value Ref Range   WBC 15.3 (H) 4.0 - 10.5 K/uL   RBC 4.56 4.22 - 5.81 MIL/uL   Hemoglobin 14.1 13.0 - 17.0 g/dL   HCT 40.8 39.0 - 52.0 %   MCV 89.5 78.0 - 100.0 fL   MCH 30.9 26.0 - 34.0 pg   MCHC 34.6 30.0 - 36.0 g/dL   RDW 14.3 11.5 - 15.5 %   Platelets 276 150 - 400 K/uL   Neutrophils Relative % 76 43 - 77 %   Neutro Abs 11.6 (H) 1.7 - 7.7 K/uL   Lymphocytes Relative 19 12 - 46 %   Lymphs Abs 2.9 0.7 - 4.0 K/uL   Monocytes Relative 3 3 - 12 %   Monocytes Absolute 0.5 0.1 - 1.0 K/uL   Eosinophils Relative 2 0 - 5 %   Eosinophils Absolute 0.2 0.0 - 0.7 K/uL   Basophils Relative 0 0 - 1 %   Basophils Absolute 0.1 0.0 - 0.1 K/uL  Lipase, blood  Result Value Ref Range   Lipase 18 (L) 22 - 51 U/L  Hepatic function panel  Result Value Ref Range   Total Protein 8.4 (H) 6.5 - 8.1 g/dL   Albumin 4.2 3.5 - 5.0 g/dL   AST 28 15 - 41 U/L   ALT 27 17 - 63 U/L   Alkaline Phosphatase 98 38 - 126 U/L   Total Bilirubin 1.3 (H) 0.3 - 1.2 mg/dL   Bilirubin, Direct 0.2 0.1 - 0.5 mg/dL   Indirect Bilirubin 1.1 (H) 0.3 - 0.9 mg/dL  Urinalysis, Routine w reflex microscopic (not at [facility])  Result Value Ref Range   Color, Urine YELLOW YELLOW   APPearance CLEAR CLEAR   Specific Gravity, Urine 1.015 1.005 - 1.030   pH 6.5 5.0 - 8.0   Glucose, UA >1000 (A) NEGATIVE mg/dL    Hgb urine dipstick LARGE (A) NEGATIVE   Bilirubin Urine NEGATIVE NEGATIVE   Ketones, ur TRACE (A) NEGATIVE mg/dL   Protein, ur 100 (A) NEGATIVE mg/dL   Urobilinogen, UA 0.2 0.0 - 1.0 mg/dL   Nitrite NEGATIVE NEGATIVE   Leukocytes, UA NEGATIVE NEGATIVE  Urine microscopic-add on  Result Value Ref Range   Squamous Epithelial / LPF RARE RARE  WBC, UA 0-2 <3 WBC/hpf   RBC / HPF 21-50 <3 RBC/hpf   Bacteria, UA RARE RARE  POC CBG, ED  Result Value Ref Range   Glucose-Capillary 435 (H) 65 - 99 mg/dL   Dg Abd Acute W/chest 04/01/2015   CLINICAL DATA:  Abdominal pain, nausea, vomiting today  EXAM: DG ABDOMEN ACUTE W/ 1V CHEST  COMPARISON:  None.  FINDINGS: There is no evidence of dilated bowel loops or free intraperitoneal air. No radiopaque calculi or other significant radiographic abnormality is seen. Heart size and mediastinal contours are within normal limits. Both lungs are clear.  IMPRESSION: Negative abdominal radiographs.  No acute cardiopulmonary disease.   Electronically Signed   By: Lahoma Crocker M.D.   On: 04/01/2015 19:40     2105:  CBG now 287 after IVF NS. Pt continues to c/o N/V despite multiple doses of IV meds; will observation admit. EPIC chart reviewed: pt was rx HTN meds, but not taking them. T/C to [facility] Dr. Marily Memos, case discussed, including:  HPI, pertinent PM/SHx, VS/PE, dx testing, ED course and treatment:  Agreeable to admit, requests to write temporary orders, obtain observation tele bed to team [facility].   Francine Graven, DO 04/03/15 1259

## 2015-04-01 NOTE — ED Notes (Signed)
Having vomiting, diarrhea, thirsty, and abdominal pain.  Rates pain 9/10 .

## 2015-04-02 ENCOUNTER — Encounter (HOSPITAL_COMMUNITY): Payer: Self-pay

## 2015-04-02 DIAGNOSIS — E1043 Type 1 diabetes mellitus with diabetic autonomic (poly)neuropathy: Secondary | ICD-10-CM | POA: Diagnosis present

## 2015-04-02 DIAGNOSIS — Z8249 Family history of ischemic heart disease and other diseases of the circulatory system: Secondary | ICD-10-CM | POA: Diagnosis not present

## 2015-04-02 DIAGNOSIS — E860 Dehydration: Secondary | ICD-10-CM | POA: Diagnosis present

## 2015-04-02 DIAGNOSIS — R739 Hyperglycemia, unspecified: Secondary | ICD-10-CM | POA: Diagnosis present

## 2015-04-02 DIAGNOSIS — Z833 Family history of diabetes mellitus: Secondary | ICD-10-CM | POA: Diagnosis not present

## 2015-04-02 DIAGNOSIS — F17210 Nicotine dependence, cigarettes, uncomplicated: Secondary | ICD-10-CM | POA: Diagnosis present

## 2015-04-02 DIAGNOSIS — E785 Hyperlipidemia, unspecified: Secondary | ICD-10-CM | POA: Diagnosis present

## 2015-04-02 DIAGNOSIS — E1065 Type 1 diabetes mellitus with hyperglycemia: Secondary | ICD-10-CM | POA: Diagnosis present

## 2015-04-02 DIAGNOSIS — F1210 Cannabis abuse, uncomplicated: Secondary | ICD-10-CM | POA: Diagnosis present

## 2015-04-02 DIAGNOSIS — K3184 Gastroparesis: Secondary | ICD-10-CM | POA: Diagnosis present

## 2015-04-02 DIAGNOSIS — Z794 Long term (current) use of insulin: Secondary | ICD-10-CM | POA: Diagnosis not present

## 2015-04-02 DIAGNOSIS — I10 Essential (primary) hypertension: Secondary | ICD-10-CM | POA: Diagnosis present

## 2015-04-02 LAB — CBC
HEMATOCRIT: 34.2 % — AB (ref 39.0–52.0)
Hemoglobin: 11.7 g/dL — ABNORMAL LOW (ref 13.0–17.0)
MCH: 30.6 pg (ref 26.0–34.0)
MCHC: 34.2 g/dL (ref 30.0–36.0)
MCV: 89.5 fL (ref 78.0–100.0)
PLATELETS: 277 10*3/uL (ref 150–400)
RBC: 3.82 MIL/uL — AB (ref 4.22–5.81)
RDW: 14.3 % (ref 11.5–15.5)
WBC: 11.2 10*3/uL — AB (ref 4.0–10.5)

## 2015-04-02 LAB — GLUCOSE, CAPILLARY
GLUCOSE-CAPILLARY: 329 mg/dL — AB (ref 65–99)
GLUCOSE-CAPILLARY: 42 mg/dL — AB (ref 65–99)
GLUCOSE-CAPILLARY: 162 mg/dL — AB (ref 65–99)
Glucose-Capillary: 287 mg/dL — ABNORMAL HIGH (ref 65–99)
Glucose-Capillary: 42 mg/dL — CL (ref 65–99)
Glucose-Capillary: 108 mg/dL — ABNORMAL HIGH (ref 65–99)
Glucose-Capillary: 35 mg/dL — CL (ref 65–99)
Glucose-Capillary: 89 mg/dL (ref 65–99)
Glucose-Capillary: 189 mg/dL — ABNORMAL HIGH (ref 65–99)

## 2015-04-02 LAB — BASIC METABOLIC PANEL
ANION GAP: 7 (ref 5–15)
BUN: 18 mg/dL (ref 6–20)
CO2: 28 mmol/L (ref 22–32)
Calcium: 8.6 mg/dL — ABNORMAL LOW (ref 8.9–10.3)
Chloride: 105 mmol/L (ref 101–111)
Creatinine, Ser: 1.14 mg/dL (ref 0.61–1.24)
GFR calc non Af Amer: >60 mL/min (ref >60)
GLUCOSE: 73 mg/dL (ref 65–99)
Potassium: 3.7 mmol/L (ref 3.5–5.1)
SODIUM: 140 mmol/L (ref 135–145)

## 2015-04-02 MED ORDER — INSULIN DETEMIR 100 UNIT/ML ~~LOC~~ SOLN
40.0000 [IU] | Freq: Two times a day (BID) | SUBCUTANEOUS | Status: DC
  Administered 2015-04-02 – 2015-04-03 (×2): 40 [IU] via SUBCUTANEOUS
  Filled 2015-04-02 (×10): qty 0.4

## 2015-04-02 NOTE — Progress Notes (Signed)
Pt CBG 35. MD notified and made aware. Received verbal orders to place carb modified diet for patient. Pt offered 4 oz juice will recheck CBG and monitor patient.

## 2015-04-02 NOTE — Progress Notes (Signed)
Patient with acute gastroenteritis now resolving fairly normal glycemic control to start more carb modified diet will give him half of his Levemir to 40 subcutaneous twice a day and normal is 70 subcutaneous twice a day  Nathaniel [name] [id number] DOB: 01-09-1986 DOA: 04/01/2015 PCP: Maricela Curet, MD             Physical Exam: Blood pressure 131/79, pulse 103, temperature 97.8 F (36.6 C), temperature source Oral, resp. rate 20, height '6\' 1"'  (1.854 m), weight 150 lb (68.04 kg), SpO2 100 %. neck no JVD no carotid bruits no thyromegaly lungs clear to A&P no rales wheeze rhonchi heart regular rhythm no murmurs gallops heaves thrills rubs abdomen soft nontender bowel sounds normoactive no peristaltic rushes   Investigations:  No results found for this or any previous visit (from the past 240 hour(s)).   Basic Metabolic Panel:  Recent Labs  04/01/15 1715 04/02/15 0600  NA 137 140  K 4.1 3.7  CL 99* 105  CO2 25 28  GLUCOSE 426* 73  BUN 22* 18  CREATININE 1.22 1.14  CALCIUM 10.3 8.6*   Liver Function Tests:  Recent Labs  04/01/15 1715  AST 28  ALT 27  ALKPHOS 98  BILITOT 1.3*  PROT 8.4*  ALBUMIN 4.2     CBC:  Recent Labs  04/01/15 1715 04/02/15 0600  WBC 15.3* 11.2*  NEUTROABS 11.6*  --   HGB 14.1 11.7*  HCT 40.8 34.2*  MCV 89.5 89.5  PLT 276 277    Dg Abd Acute W/chest  04/01/2015   CLINICAL DATA:  Abdominal pain, nausea, vomiting today  EXAM: DG ABDOMEN ACUTE W/ 1V CHEST  COMPARISON:  None.  FINDINGS: There is no evidence of dilated bowel loops or free intraperitoneal air. No radiopaque calculi or other significant radiographic abnormality is seen. Heart size and mediastinal contours are within normal limits. Both lungs are clear.  IMPRESSION: Negative abdominal radiographs.  No acute cardiopulmonary disease.   Electronically Signed   By: Lahoma Crocker M.D.   On: 04/01/2015 19:40      Medications:   Impression: Gastroenteritis insulin  dependent diabetes  Active Problems:   Dehydration   Tobacco abuse   Hyperglycemia without ketosis   Essential hypertension, benign   Intractable nausea and vomiting   Gastroparesis   Type 1 diabetes mellitus with complication     Plan: Stools for C. difficile by PCR be met in a.m. and Levemir 40 every 12 monitor glucose is  Consultants:    Procedure   Antibiotics:                   Code Status:  Family Communication:    Disposition Plan see plan above  Time spent: 30 minutes     Berenize Gatlin M   04/02/2015, 12:39 PM

## 2015-04-02 NOTE — Progress Notes (Signed)
Pt. CBG 108. Will continue to monitor patient throughout the shift.

## 2015-04-02 NOTE — Progress Notes (Signed)
Inpatient Diabetes Program Recommendations  AACE/ADA: New Consensus Statement on Inpatient Glycemic Control (2013)  Target Ranges:  Prepandial:   less than 140 mg/dL      Peak postprandial:   less than 180 mg/dL (1-2 hours)      Critically ill patients:  140 - 180 mg/dL   Results for Nathaniel [name], Nathaniel [name] (MRN [id number]) as of 04/02/2015 08:52  Ref. Range 04/01/2015 16:43 04/01/2015 19:11 04/01/2015 20:29 04/02/2015 08:02 04/02/2015 08:35  Glucose-Capillary Latest Ref Range: 65-99 mg/dL 435 (H) 329 (H) 287 (H) 42 (LL) 42 (LL)   Reason for Visit: N/V  Diabetes history: DM 2 Outpatient Diabetes medications: Levemir 70 units BID Current orders for Inpatient glycemic control: Levemir 35 units BID, Novolog Moderate scale (0-15 units) TID  Inpatient Diabetes Program Recommendations Insulin - Basal: Hypoglycemia in the 40's. Please consider decreasing basal insulin significantly down to 12 units BID (0.2 units/kg).  Thanks,  Tama Headings RN, MSN, [facility] Inpatient Diabetes Coordinator Team Pager [phone]

## 2015-04-02 NOTE — Progress Notes (Signed)
Pt's CBG 89. Will continue to monitor patient throughout the shift.

## 2015-04-02 NOTE — Care Management Note (Signed)
Case Management Note  Patient Details  Name: Nathaniel [name] MRN: [id number] Date of Birth: May 31, 1986  Subjective/Objective:                  Pt admitted from home with intractable nausea and vomiting. Pt lives with family and will return home at discharge. Pt is independent with ADL's.  Action/Plan: No CM needs noted.  Expected Discharge Date:  04/05/15               Expected Discharge Plan:  Home/Self Care  In-House Referral:  NA  Discharge planning Services  CM Consult  Post Acute Care Choice:  NA Choice offered to:  NA  DME Arranged:    DME Agency:     HH Arranged:    HH Agency:     Status of Service:  Completed, signed off  Medicare Important Message Given:    Date Medicare IM Given:    Medicare IM give by:    Date Additional Medicare IM Given:    Additional Medicare Important Message give by:     If discussed at [place] of Stay Meetings, dates discussed:    Additional Comments:  Joylene Draft, RN 04/02/2015, 12:41 PM

## 2015-04-03 LAB — LIPASE, BLOOD: Lipase: 24 U/L (ref 22–51)

## 2015-04-03 LAB — HEMOGLOBIN A1C
Hgb A1c MFr Bld: 9.3 % — ABNORMAL HIGH (ref 4.8–5.6)
MEAN PLASMA GLUCOSE: 220 mg/dL

## 2015-04-03 LAB — GLUCOSE, CAPILLARY
GLUCOSE-CAPILLARY: 254 mg/dL — AB (ref 65–99)
Glucose-Capillary: 50 mg/dL — ABNORMAL LOW (ref 65–99)
Glucose-Capillary: 61 mg/dL — ABNORMAL LOW (ref 65–99)
Glucose-Capillary: 131 mg/dL — ABNORMAL HIGH (ref 65–99)
Glucose-Capillary: 143 mg/dL — ABNORMAL HIGH (ref 65–99)
Glucose-Capillary: 179 mg/dL — ABNORMAL HIGH (ref 65–99)

## 2015-04-03 LAB — BASIC METABOLIC PANEL
Anion gap: 13 (ref 5–15)
BUN: 11 mg/dL (ref 6–20)
CO2: 24 mmol/L (ref 22–32)
Calcium: 8.7 mg/dL — ABNORMAL LOW (ref 8.9–10.3)
Chloride: 97 mmol/L — ABNORMAL LOW (ref 101–111)
Creatinine, Ser: 1.02 mg/dL (ref 0.61–1.24)
GFR calc Af Amer: >60 mL/min (ref >60)
GFR calc non Af Amer: >60 mL/min (ref >60)
Glucose, Bld: 299 mg/dL — ABNORMAL HIGH (ref 65–99)
Potassium: 3.7 mmol/L (ref 3.5–5.1)
SODIUM: 134 mmol/L — AB (ref 135–145)

## 2015-04-03 MED ORDER — DEXTROSE 50 % IV SOLN
INTRAVENOUS | Status: AC
  Administered 2015-04-03: 50 mL
  Filled 2015-04-03: qty 50

## 2015-04-03 NOTE — Progress Notes (Signed)
Hypoglycemic Event  CBG: 50  Treatment: D50 IV 25 mL  Symptoms: Nauseous   Follow-up CBG: Time: 1241 CBG Result:131  Possible Reasons for Event: Inadequate meal intake. Patient unable to eat and drink at this time due to nausea and vomiting.  Comments/MD notified: Dr. Ernestine Mcmurray, Venita Sheffield  Remember to initiate Hypoglycemia Order Set & complete

## 2015-04-03 NOTE — Progress Notes (Signed)
States he fears eating patient not eating breakfast or lunch today generalized abdominal discomfort mild sore gastroparesis I have a concern that opiates may be contributing to this we'll discontinue morphine patient had low blood sugar before meals lunch today and 50 tolerating juice now Nathaniel [name] [id number] DOB: [birth year] DOA: 04/01/2015 PCP: Maricela Curet, MD             Physical Exam: Blood pressure 163/111, pulse 102, temperature 98.7 F (37.1 C), temperature source Oral, resp. rate 20, height 6\' 1"  (1.854 m), weight 150 lb (68.04 kg), SpO2 100 %. lungs clear to A&P no rales wheeze rhonchi heart regular rhythm no murmurs gallops rubs abdomen soft nontender bowel sounds normoactive no peristaltic rushes   Investigations:  No results found for this or any previous visit (from the past 240 hour(s)).   Basic Metabolic Panel:  Recent Labs  04/02/15 0600 04/03/15 0659  NA 140 134*  K 3.7 3.7  CL 105 97*  CO2 28 24  GLUCOSE 73 299*  BUN 18 11  CREATININE 1.14 1.02  CALCIUM 8.6* 8.7*   Liver Function Tests:  Recent Labs  04/01/15 1715  AST 28  ALT 27  ALKPHOS 98  BILITOT 1.3*  PROT 8.4*  ALBUMIN 4.2     CBC:  Recent Labs  04/01/15 1715 04/02/15 0600  WBC 15.3* 11.2*  NEUTROABS 11.6*  --   HGB 14.1 11.7*  HCT 40.8 34.2*  MCV 89.5 89.5  PLT 276 277    Dg Abd Acute W/chest  04/01/2015   CLINICAL DATA:  Abdominal pain, nausea, vomiting today  EXAM: DG ABDOMEN ACUTE W/ 1V CHEST  COMPARISON:  None.  FINDINGS: There is no evidence of dilated bowel loops or free intraperitoneal air. No radiopaque calculi or other significant radiographic abnormality is seen. Heart size and mediastinal contours are within normal limits. Both lungs are clear.  IMPRESSION: Negative abdominal radiographs.  No acute cardiopulmonary disease.   Electronically Signed   By: Lahoma Crocker M.D.   On: 04/01/2015 19:40      Medications:   Impression:  Active  Problems:   Dehydration   Tobacco abuse   Hyperglycemia without ketosis   Essential hypertension, benign   Intractable nausea and vomiting   Gastroparesis   Type 1 diabetes mellitus with complication     Plan: Discontinue discontinue morphine sulfate as this may be contributory to bowel function abnormalities continue diet and see how he tolerates this monitor before meals and at bedtime glucose  Consultants:    Procedures   Antibiotics:                   Code Status:  Family Communication:  Spoke with patient and family in room  Disposition Plan see plan above  Time spent: 30 minutes   LOS: 1 day   Julianne Chamberlin M   04/03/2015, 1:09 PM

## 2015-04-03 NOTE — Progress Notes (Signed)
Inpatient Diabetes Program Recommendations  AACE/ADA: New Consensus Statement on Inpatient Glycemic Control (2013)  Target Ranges:  Prepandial:   less than 140 mg/dL      Peak postprandial:   less than 180 mg/dL (1-2 hours)      Critically ill patients:  140 - 180 mg/dL   Results for ZYMARI, BAYER (MRN [id number]) as of 04/03/2015 14:47  Ref. Range 04/03/2015 08:00 04/03/2015 11:27 04/03/2015 12:24 04/03/2015 12:41  Glucose-Capillary Latest Ref Range: 65-99 mg/dL 254 (H) 50 (L) 61 (L) 131 (H)   Results for DESHAN, GAUDINO (MRN [id number]) as of 04/03/2015 14:47  Ref. Range 04/02/2015 08:02 04/02/2015 08:35 04/02/2015 09:50 04/02/2015 11:30 04/02/2015 12:32 04/02/2015 17:14 04/02/2015 21:09  Glucose-Capillary Latest Ref Range: 65-99 mg/dL 42 (LL) 42 (LL) 108 (H) 35 (LL) 89 162 (H) 189 (H)   Outpatient Diabetes medications: Levemir 70 units BID Current orders for Inpatient glycemic control: Levemir 40 units BID, Novolog 0-15 units TID with meals  Inpatient Diabetes Program Recommendations Insulin - Basal: Patient did not receive am dose of Levemir on 7/13 but did receive Levemir 40 units last night at bedtime. Fasting glucose 254 mg/dl this morning and patient refused morning dose of Levemir today. Glucose down to 50 mg/dl at 11:27 am today. Please consider decreasing Levemir to 25 units QHS. Correction (SSI): Please consider decreasing Novolog correction to senstive scale and increasing frequency of CBGs and Novolog to Q4H. HgbA1C: A1C 9.3% on 04/01/15 indicating poor glycemic control.  Note: Spoke with patient about diabetes and home regimen for diabetes control. Patient reports that he is followed by his PCP for diabetes management and currently he takes Levemir 70 units BID as an outpatient for diabetes control.  Inquired about how often he misses or skips Levemir dosages and patient reports that he "skips Levemir if my sugar is okay and I am not going to eat much". Inquired about glycemic  control and patient admits that he does not check his glucose very often. Inquired about hypoglycemia and patient states that he has at least 3-4 episodes of hypoglycemia each week, usually awaken from sleep feeling low but does not check an actual glucose level. Discussed A1C results and stressed importance of checking CBGs and maintaining good CBG control to prevent long-term and short-term complications. Encouraged patient to try to check his glucose at least 4 times per day and to keep a log of glucose readings which his doctor can use to make adjustments with his insulin dosages. Discussed need to take Levemir consistently as prescribed and to talk with his doctor about frequent hypoglycemic episodes. Patient states that he has not been eating much of anything today because when he does he states that it makes his abdominal pain worse. Encouraged patient to try to eat.  Patient verbalized understanding of information discussed and he states that he has no further questions at this time related to diabetes.   Thanks, Barnie Alderman, RN, MSN, CCRN, CDE Diabetes Coordinator Inpatient Diabetes Program [phone] (Team Pager) [phone] ([place] office) [phone] [facility] office) [phone] [facility] office)

## 2015-04-04 LAB — GLUCOSE, CAPILLARY
GLUCOSE-CAPILLARY: 38 mg/dL — AB (ref 65–99)
Glucose-Capillary: 156 mg/dL — ABNORMAL HIGH (ref 65–99)
Glucose-Capillary: 108 mg/dL — ABNORMAL HIGH (ref 65–99)

## 2015-04-04 LAB — BASIC METABOLIC PANEL
Anion gap: 6 (ref 5–15)
BUN: 9 mg/dL (ref 6–20)
CO2: 27 mmol/L (ref 22–32)
Calcium: 8.4 mg/dL — ABNORMAL LOW (ref 8.9–10.3)
Chloride: 103 mmol/L (ref 101–111)
Creatinine, Ser: 0.98 mg/dL (ref 0.61–1.24)
GFR calc non Af Amer: >60 mL/min (ref >60)
Glucose, Bld: 175 mg/dL — ABNORMAL HIGH (ref 65–99)
POTASSIUM: 3.6 mmol/L (ref 3.5–5.1)
Sodium: 136 mmol/L (ref 135–145)

## 2015-04-04 LAB — LIPASE, BLOOD: Lipase: 13 U/L — ABNORMAL LOW (ref 22–51)

## 2015-04-04 MED ORDER — LISINOPRIL 10 MG PO TABS
10.0000 mg | ORAL_TABLET | Freq: Every day | ORAL | Status: DC
Start: 2015-04-04 — End: 2018-02-22

## 2015-04-04 NOTE — Care Management Note (Signed)
Case Management Note  Patient Details  Name: Nathaniel [name] MRN: [id number] Date of Birth: 1986-07-13  Subjective/Objective:                    Action/Plan:   Expected Discharge Date:  04/05/15               Expected Discharge Plan:  Home/Self Care  In-House Referral:  NA  Discharge planning Services  CM Consult  Post Acute Care Choice:  NA Choice offered to:  NA  DME Arranged:    DME Agency:     HH Arranged:    HH Agency:     Status of Service:  Completed, signed off  Medicare Important Message Given:  Yes-second notification given Date Medicare IM Given:    Medicare IM give by:    Date Additional Medicare IM Given:    Additional Medicare Important Message give by:     If discussed at [place] of Stay Meetings, dates discussed:    Additional Comments: Anticipate discharge today. No CM needs noted. Christinia Gully [place], RN 04/04/2015, 11:03 AM

## 2015-04-04 NOTE — Progress Notes (Addendum)
Hypoglycemic Event  CBG: 38  Treatment: 15 GM carbohydrate snack and lunch tray  Symptoms: Sweaty  Follow-up CBG: Time: 1237    CBG Result 108  Possible Reasons for Event: Unknown  Comments/MD notified: Notified Dr. [name]. Stated hold Levemir dose for today, pt to resume at home once discharged.     Verdell Face Joy  Remember to initiate Hypoglycemia Order Set & complete

## 2015-04-04 NOTE — Progress Notes (Signed)
Discharge instructions reviewed with patient, given copy of AVS. Prescription for lisinopril sent to [facility] pharmacy per MD. Reviewed home medications with patient, instructed to notify MD for any further nausea, vomiting, poor appetite especially with insulin use and low CBGs. Stated he understood. IV site d/c'd per tiffany easter, NT. Pt left floor in stable condition via w/c accompanied by nurse tech. Donavan Foil, RN

## 2015-04-04 NOTE — Care Management Important Message (Signed)
Important Message  Patient Details  Name: KARYM JANES MRN: [id number] Date of Birth: 1986-01-28   Medicare Important Message Given:  Yes-second notification given    Joylene Draft, RN 04/04/2015, 11:03 AM

## 2015-04-04 NOTE — Discharge Summary (Signed)
Physician Discharge Summary  Nathaniel [name] [id number] DOB: 13-Dec-1985 DOA: 04/01/2015  PCP: Maricela Curet, MD  Admit date: 04/01/2015 Discharge date: 04/04/2015   Recommendations for Outpatient Follow-up:  Patient advised to take Only 40 Units of Levemir Twice a Day First 2 Days Home and Then Increase It to 70 Once He Starts Eating Regularly My Office One Week's Time to Assess Electrolytes Glycemic Control Discharge Diagnoses:  Active Problems:   Dehydration   Tobacco abuse   Hyperglycemia without ketosis   Essential hypertension, benign   Intractable nausea and vomiting   Gastroparesis   Type 1 diabetes mellitus with complication   Discharge Condition: Good  Filed Weights   04/01/15 1644  Weight: 150 lb (68.04 kg)    History of present illness:  Rhythm admitted with intractable nausea and vomiting he had a diagnosis of gastroparesis from type I insulin diabetes and some diffuse abdominal pain this improved over 2 to three-day period he had gradual introduction clear liquids and full liquids and car modified diet had a couple episodes of hypoglycemia in hospital and his Levemir was kept at 40 units subcutaneous twice a day he had no further complications his electrolytes were in normal limits with no evidence of renal insufficiency and he was discharged on the fourth hospital day  Hospital Course:  See history of present illness above  Procedures:    Consultations:    Discharge Instructions  Discharge Instructions    Discharge instructions    Complete by:  As directed      Discharge patient    Complete by:  As directed             Medication List    TAKE these medications        ibuprofen 200 MG tablet  Commonly known as:  ADVIL,MOTRIN  Take 200 mg by mouth every 6 (six) hours as needed for mild pain or moderate pain.     LEVEMIR FLEXTOUCH 100 UNIT/ML Pen  Generic drug:  Insulin Detemir  INJECT 70UNITS SUBCUTNEOUSLY TWICE A DAY     lisinopril 10 MG tablet  Commonly known as:  PRINIVIL,ZESTRIL  Take 1 tablet (10 mg total) by mouth daily.       Allergies  Allergen Reactions  . Orange     Increases blood sugar immediately       The results of significant diagnostics from this hospitalization (including imaging, microbiology, ancillary and laboratory) are listed below for reference.    Significant Diagnostic Studies: Dg Abd Acute W/chest  04/01/2015   CLINICAL DATA:  Abdominal pain, nausea, vomiting today  EXAM: DG ABDOMEN ACUTE W/ 1V CHEST  COMPARISON:  None.  FINDINGS: There is no evidence of dilated bowel loops or free intraperitoneal air. No radiopaque calculi or other significant radiographic abnormality is seen. Heart size and mediastinal contours are within normal limits. Both lungs are clear.  IMPRESSION: Negative abdominal radiographs.  No acute cardiopulmonary disease.   Electronically Signed   By: Lahoma Crocker M.D.   On: 04/01/2015 19:40    Microbiology: No results found for this or any previous visit (from the past 240 hour(s)).   Labs: Basic Metabolic Panel:  Recent Labs Lab 04/01/15 1715 04/02/15 0600 04/03/15 0659 04/04/15 0614  NA 137 140 134* 136  K 4.1 3.7 3.7 3.6  CL 99* 105 97* 103  CO2 25 28 24 27   GLUCOSE 426* 73 299* 175*  BUN 22* 18 11 9   CREATININE 1.22 1.14 1.02 0.98  CALCIUM  10.3 8.6* 8.7* 8.4*   Liver Function Tests:  Recent Labs Lab 04/01/15 1715  AST 28  ALT 27  ALKPHOS 98  BILITOT 1.3*  PROT 8.4*  ALBUMIN 4.2    Recent Labs Lab 04/01/15 1715 04/03/15 0659 04/04/15 0614  LIPASE 18* 24 13*   No results for input(s): AMMONIA in the last 168 hours. CBC:  Recent Labs Lab 04/01/15 1715 04/02/15 0600  WBC 15.3* 11.2*  NEUTROABS 11.6*  --   HGB 14.1 11.7*  HCT 40.8 34.2*  MCV 89.5 89.5  PLT 276 277   Cardiac Enzymes: No results for input(s): CKTOTAL, CKMB, CKMBINDEX, TROPONINI in the last 168 hours. BNP: BNP (last 3 results) No results for  input(s): BNP in the last 8760 hours.  ProBNP (last 3 results) No results for input(s): PROBNP in the last 8760 hours.  CBG:  Recent Labs Lab 04/03/15 1637 04/03/15 2027 04/04/15 0806 04/04/15 1159 04/04/15 1237  GLUCAP 143* 179* 156* 38* 108*       Signed:  Regla Fitzgibbon M  [facility] Pager: [phone] 04/04/2015, 12:53 PM

## 2015-06-07 ENCOUNTER — Encounter (HOSPITAL_COMMUNITY): Payer: Self-pay | Admitting: Emergency Medicine

## 2015-06-07 ENCOUNTER — Inpatient Hospital Stay (HOSPITAL_COMMUNITY)
Admission: EM | Admit: 2015-06-07 | Discharge: 2015-06-11 | DRG: 639 | Disposition: A | Discharge status: Home / Self Care | Payer: Medicare Other | Attending: Family Medicine | Admitting: Family Medicine

## 2015-06-07 DIAGNOSIS — E10649 Type 1 diabetes mellitus with hypoglycemia without coma: Secondary | ICD-10-CM | POA: Diagnosis present

## 2015-06-07 DIAGNOSIS — E1110 Type 2 diabetes mellitus with ketoacidosis without coma: Secondary | ICD-10-CM | POA: Diagnosis present

## 2015-06-07 DIAGNOSIS — E1010 Type 1 diabetes mellitus with ketoacidosis without coma: Secondary | ICD-10-CM | POA: Diagnosis present

## 2015-06-07 DIAGNOSIS — F5089 Other specified eating disorder: Secondary | ICD-10-CM

## 2015-06-07 DIAGNOSIS — F17210 Nicotine dependence, cigarettes, uncomplicated: Secondary | ICD-10-CM | POA: Diagnosis present

## 2015-06-07 DIAGNOSIS — E1310 Other specified diabetes mellitus with ketoacidosis without coma: Secondary | ICD-10-CM

## 2015-06-07 DIAGNOSIS — R112 Nausea with vomiting, unspecified: Secondary | ICD-10-CM | POA: Diagnosis not present

## 2015-06-07 DIAGNOSIS — Z23 Encounter for immunization: Secondary | ICD-10-CM

## 2015-06-07 DIAGNOSIS — F502 Bulimia nervosa: Secondary | ICD-10-CM

## 2015-06-07 DIAGNOSIS — K3184 Gastroparesis: Secondary | ICD-10-CM

## 2015-06-07 DIAGNOSIS — E785 Hyperlipidemia, unspecified: Secondary | ICD-10-CM | POA: Diagnosis present

## 2015-06-07 DIAGNOSIS — E876 Hypokalemia: Secondary | ICD-10-CM

## 2015-06-07 DIAGNOSIS — I10 Essential (primary) hypertension: Secondary | ICD-10-CM | POA: Diagnosis present

## 2015-06-07 DIAGNOSIS — Z8249 Family history of ischemic heart disease and other diseases of the circulatory system: Secondary | ICD-10-CM

## 2015-06-07 DIAGNOSIS — Z794 Long term (current) use of insulin: Secondary | ICD-10-CM

## 2015-06-07 DIAGNOSIS — E1043 Type 1 diabetes mellitus with diabetic autonomic (poly)neuropathy: Secondary | ICD-10-CM | POA: Diagnosis present

## 2015-06-07 DIAGNOSIS — E0810 Diabetes mellitus due to underlying condition with ketoacidosis without coma: Secondary | ICD-10-CM

## 2015-06-07 DIAGNOSIS — E860 Dehydration: Secondary | ICD-10-CM | POA: Diagnosis present

## 2015-06-07 DIAGNOSIS — Z9111 Patient's noncompliance with dietary regimen: Secondary | ICD-10-CM

## 2015-06-07 DIAGNOSIS — F1210 Cannabis abuse, uncomplicated: Secondary | ICD-10-CM | POA: Diagnosis present

## 2015-06-07 DIAGNOSIS — R1110 Vomiting, unspecified: Secondary | ICD-10-CM

## 2015-06-07 DIAGNOSIS — E108 Type 1 diabetes mellitus with unspecified complications: Secondary | ICD-10-CM

## 2015-06-07 DIAGNOSIS — Z833 Family history of diabetes mellitus: Secondary | ICD-10-CM | POA: Diagnosis not present

## 2015-06-07 DIAGNOSIS — R739 Hyperglycemia, unspecified: Secondary | ICD-10-CM

## 2015-06-07 LAB — BLOOD GAS, VENOUS
ACID-BASE DEFICIT: 4.3 mmol/L — AB (ref 0.0–2.0)
Bicarbonate: 20.1 mEq/L (ref 20.0–24.0)
FIO2: 21
O2 Saturation: 72.1 %
PCO2 VEN: 41.4 mmHg — AB (ref 45.0–50.0)
PH VEN: 7.32 — AB (ref 7.250–7.300)
TCO2: 14.7 mmol/L (ref 0–100)
pO2, Ven: 41.9 mmHg (ref 30.0–45.0)

## 2015-06-07 LAB — URINE MICROSCOPIC-ADD ON

## 2015-06-07 LAB — URINALYSIS, ROUTINE W REFLEX MICROSCOPIC
BILIRUBIN URINE: NEGATIVE
Glucose, UA: >1000 mg/dL — AB
Ketones, ur: >80 mg/dL — AB
LEUKOCYTES UA: NEGATIVE
NITRITE: NEGATIVE
Protein, ur: >300 mg/dL — AB
SPECIFIC GRAVITY, URINE: 1.025 (ref 1.005–1.030)
UROBILINOGEN UA: 0.2 mg/dL (ref 0.0–1.0)
pH: 6 (ref 5.0–8.0)

## 2015-06-07 LAB — CBC WITH DIFFERENTIAL/PLATELET
BASOS ABS: 0.1 10*3/uL (ref 0.0–0.1)
Basophils Relative: 0 %
Eosinophils Absolute: 0 10*3/uL (ref 0.0–0.7)
Eosinophils Relative: 0 %
HEMATOCRIT: 42 % (ref 39.0–52.0)
Hemoglobin: 14.8 g/dL (ref 13.0–17.0)
LYMPHS ABS: 2 10*3/uL (ref 0.7–4.0)
LYMPHS PCT: 12 %
MCH: 31.6 pg (ref 26.0–34.0)
MCHC: 35.2 g/dL (ref 30.0–36.0)
MCV: 89.6 fL (ref 78.0–100.0)
Monocytes Absolute: 0.3 10*3/uL (ref 0.1–1.0)
Monocytes Relative: 2 %
NEUTROS ABS: 14.8 10*3/uL — AB (ref 1.7–7.7)
Neutrophils Relative %: 86 %
Platelets: 283 10*3/uL (ref 150–400)
RBC: 4.69 MIL/uL (ref 4.22–5.81)
RDW: 13.4 % (ref 11.5–15.5)
WBC: 17.2 10*3/uL — AB (ref 4.0–10.5)

## 2015-06-07 LAB — BASIC METABOLIC PANEL
ANION GAP: 22 — AB (ref 5–15)
ANION GAP: 11 (ref 5–15)
BUN: 20 mg/dL (ref 6–20)
BUN: 18 mg/dL (ref 6–20)
CHLORIDE: 95 mmol/L — AB (ref 101–111)
CO2: 22 mmol/L (ref 22–32)
CO2: 25 mmol/L (ref 22–32)
Calcium: 10.2 mg/dL (ref 8.9–10.3)
Calcium: 8.6 mg/dL — ABNORMAL LOW (ref 8.9–10.3)
Chloride: 107 mmol/L (ref 101–111)
Creatinine, Ser: 1.47 mg/dL — ABNORMAL HIGH (ref 0.61–1.24)
Creatinine, Ser: 1.22 mg/dL (ref 0.61–1.24)
GFR calc Af Amer: >60 mL/min (ref >60)
GFR calc Af Amer: >60 mL/min (ref >60)
GFR calc non Af Amer: >60 mL/min (ref >60)
GLUCOSE: 503 mg/dL — AB (ref 65–99)
Glucose, Bld: 147 mg/dL — ABNORMAL HIGH (ref 65–99)
POTASSIUM: 4.2 mmol/L (ref 3.5–5.1)
POTASSIUM: 4.1 mmol/L (ref 3.5–5.1)
SODIUM: 143 mmol/L (ref 135–145)
Sodium: 139 mmol/L (ref 135–145)

## 2015-06-07 LAB — CBG MONITORING, ED
GLUCOSE-CAPILLARY: 454 mg/dL — AB (ref 65–99)
GLUCOSE-CAPILLARY: 425 mg/dL — AB (ref 65–99)
Glucose-Capillary: 407 mg/dL — ABNORMAL HIGH (ref 65–99)

## 2015-06-07 LAB — GLUCOSE, CAPILLARY
GLUCOSE-CAPILLARY: 274 mg/dL — AB (ref 65–99)
GLUCOSE-CAPILLARY: 128 mg/dL — AB (ref 65–99)
Glucose-Capillary: 90 mg/dL (ref 65–99)

## 2015-06-07 LAB — MRSA PCR SCREENING: MRSA by PCR: NEGATIVE

## 2015-06-07 MED ORDER — DEXTROSE-NACL 5-0.45 % IV SOLN
INTRAVENOUS | Status: AC
  Administered 2015-06-07: 22:00:00 via INTRAVENOUS

## 2015-06-07 MED ORDER — SODIUM CHLORIDE 0.9 % IV BOLUS (SEPSIS)
1000.0000 mL | Freq: Once | INTRAVENOUS | Status: AC
  Administered 2015-06-07: 1000 mL via INTRAVENOUS

## 2015-06-07 MED ORDER — LISINOPRIL 10 MG PO TABS
10.0000 mg | ORAL_TABLET | Freq: Every day | ORAL | Status: DC
Start: 2015-06-08 — End: 2015-06-11
  Administered 2015-06-08 – 2015-06-11 (×4): 10 mg via ORAL
  Filled 2015-06-07 (×4): qty 1

## 2015-06-07 MED ORDER — SODIUM CHLORIDE 0.9 % IV SOLN
INTRAVENOUS | Status: DC
  Filled 2015-06-07: qty 2.5

## 2015-06-07 MED ORDER — METOPROLOL TARTRATE 1 MG/ML IV SOLN
5.0000 mg | Freq: Once | INTRAVENOUS | Status: AC
  Administered 2015-06-07: 5 mg via INTRAVENOUS
  Filled 2015-06-07: qty 5

## 2015-06-07 MED ORDER — SODIUM CHLORIDE 0.9 % IV SOLN
INTRAVENOUS | Status: DC
  Administered 2015-06-07: 3.5 [IU]/h via INTRAVENOUS
  Filled 2015-06-07: qty 2.5

## 2015-06-07 MED ORDER — POTASSIUM CHLORIDE 10 MEQ/100ML IV SOLN
10.0000 meq | INTRAVENOUS | Status: AC
  Administered 2015-06-07 (×2): 10 meq via INTRAVENOUS
  Filled 2015-06-07 (×2): qty 100

## 2015-06-07 MED ORDER — SODIUM CHLORIDE 0.9 % IV SOLN
INTRAVENOUS | Status: DC
  Administered 2015-06-08 (×2): via INTRAVENOUS

## 2015-06-07 MED ORDER — SODIUM CHLORIDE 0.9 % IV SOLN
INTRAVENOUS | Status: AC
  Administered 2015-06-07: 22:00:00 via INTRAVENOUS

## 2015-06-07 MED ORDER — SODIUM CHLORIDE 0.9 % IV SOLN: INTRAVENOUS | Status: DC

## 2015-06-07 MED ORDER — INFLUENZA VAC SPLIT QUAD 0.5 ML IM SUSY
0.5000 mL | PREFILLED_SYRINGE | INTRAMUSCULAR | Status: AC
  Administered 2015-06-08: 0.5 mL via INTRAMUSCULAR
  Filled 2015-06-07: qty 0.5

## 2015-06-07 NOTE — ED Notes (Signed)
Pt woke up this am with generalized central upper abdo pain with n/v/d  - pt is diabetic . Not checked accucheck today

## 2015-06-07 NOTE — ED Notes (Signed)
Notified by A.C. That she was unable to get into main pharmacy refrigerator and as a result, could not obtain the insulin drip we are waiting for. Stated she would continue to try and get it.

## 2015-06-07 NOTE — ED Provider Notes (Signed)
CSN: [id number]     Arrival date & time 06/07/15  1627 History   First MD Initiated Contact with Patient 06/07/15 1635     Chief Complaint  Patient presents with  . Abdominal Pain  . Nausea  . Diarrhea     (Consider location/radiation/quality/duration/timing/severity/associated sxs/prior Treatment) HPI Comments: 29 year old male with history of tobacco abuse, marijuana abuse, DKA, uncontrolled diabetes, gastroparesis presents with recurrent epigastric pain and vomiting with elevated glucose worsening today. Patient has had this in the past. Patient unsure how long his glucose has been elevated. Denies change in medicine or missed doses of insulin. Last dose was this morning 7 units of long-acting. History of high blood pressure. Patient feels generally weak.  Patient is a 29 y.o. male presenting with abdominal pain and diarrhea. The history is provided by the patient and medical records.  Abdominal Pain Associated symptoms: chills, diarrhea, fatigue, nausea and vomiting   Associated symptoms: no chest pain, no dysuria, no fever and no shortness of breath   Diarrhea Associated symptoms: abdominal pain, chills and vomiting   Associated symptoms: no fever and no headaches     Past Medical History  Diagnosis Date  . Diabetes mellitus     lantus/novolog  . Tobacco abuse     5/day  . Marijuana abuse     occaisionally  . Hypertension   . Hyperlipidemia   . Noncompliance with medication regimen    Past Surgical History  Procedure Laterality Date  . Eye surgery     Family History  Problem Relation Age of Onset  . Diabetes Father   . Hypertension Mother   . Kidney failure Father     last 4 mnths of life   Social History  Substance Use Topics  . Smoking status: Current Every Day Smoker -- 1.00 packs/day for 8 years    Types: Cigarettes  . Smokeless tobacco: Never Used  . Alcohol Use: No    Review of Systems  Constitutional: Positive for chills, appetite change and fatigue.  Negative for fever.  HENT: Negative for congestion.   Eyes: Negative for visual disturbance.  Respiratory: Negative for shortness of breath.   Cardiovascular: Negative for chest pain.  Gastrointestinal: Positive for nausea, vomiting, abdominal pain and diarrhea.  Endocrine: Positive for polydipsia and polyuria.  Genitourinary: Negative for dysuria and flank pain.  Musculoskeletal: Negative for back pain, neck pain and neck stiffness.  Skin: Negative for rash.  Neurological: Positive for weakness and light-headedness. Negative for headaches.      Allergies  Orange  Home Medications   Prior to Admission medications   Medication Sig Start Date End Date Taking? Authorizing Dnya Hickle  ibuprofen (ADVIL,MOTRIN) 200 MG tablet Take 200 mg by mouth every 6 (six) hours as needed for mild pain or moderate pain.   Yes Historical Ryane Canavan, MD  LEVEMIR FLEXTOUCH 100 UNIT/ML Pen INJECT 70UNITS SUBCUTNEOUSLY TWICE A DAY 03/17/15  Yes Historical Adonna Horsley, MD  lisinopril (PRINIVIL,ZESTRIL) 10 MG tablet Take 1 tablet (10 mg total) by mouth daily. 04/04/15  Yes Richard [name], MD   BP 169/103 mmHg  Pulse 117  Temp(Src) 97.7 F (36.5 C) (Oral)  Resp 19  Ht 6\' 1"  (1.854 m)  Wt 147 lb 0.8 oz (66.7 kg)  BMI 19.40 kg/m2  SpO2 100% Physical Exam  Constitutional: He is oriented to person, place, and time. He appears well-developed and well-nourished.  HENT:  Head: Normocephalic and atraumatic.  Dry mucous membranes  Eyes: Conjunctivae are normal. Right eye exhibits no discharge. Left  eye exhibits no discharge.  Neck: Normal range of motion. Neck supple. No tracheal deviation present.  Cardiovascular: Regular rhythm.  Tachycardia present.   Pulmonary/Chest: Effort normal and breath sounds normal.  Abdominal: Soft. He exhibits no distension. There is tenderness (very mild epigastric discomfort). There is no guarding.  Musculoskeletal: He exhibits no edema.  Neurological: He is alert and oriented to  person, place, and time. No cranial nerve deficit (fatigue appearance).  Skin: Skin is warm. No rash noted.  Psychiatric: He has a normal mood and affect.  Nursing note and vitals reviewed.   ED Course  Procedures (including critical care time) CRITICAL CARE Performed by: Mariea Clonts   Total critical care time: 35 min  Critical care time was exclusive of separately billable procedures and treating other patients.  Critical care was necessary to treat or prevent imminent or life-threatening deterioration.  Critical care was time spent personally by me on the following activities: development of treatment plan with patient and/or surrogate as well as nursing, discussions with consultants, evaluation of patient's response to treatment, examination of patient, obtaining history from patient or surrogate, ordering and performing treatments and interventions, ordering and review of laboratory studies, ordering and review of radiographic studies, pulse oximetry and re-evaluation of patient's condition.  Labs Review Labs Reviewed  CBC WITH DIFFERENTIAL/PLATELET - Abnormal; Notable for the following:    WBC 17.2 (*)    Neutro Abs 14.8 (*)    All other components within normal limits  BASIC METABOLIC PANEL - Abnormal; Notable for the following:    Chloride 95 (*)    Glucose, Bld 503 (*)    Creatinine, Ser 1.47 (*)    Anion gap 22 (*)    All other components within normal limits  BLOOD GAS, VENOUS - Abnormal; Notable for the following:    pH, Ven 7.320 (*)    pCO2, Ven 41.4 (*)    Acid-base deficit 4.3 (*)    All other components within normal limits  URINALYSIS, ROUTINE W REFLEX MICROSCOPIC (NOT AT [facility]) - Abnormal; Notable for the following:    Glucose, UA >1000 (*)    Hgb urine dipstick LARGE (*)    Ketones, ur >80 (*)    Protein, ur >300 (*)    All other components within normal limits  URINE MICROSCOPIC-ADD ON - Abnormal; Notable for the following:    Bacteria, UA FEW (*)     All other components within normal limits  BASIC METABOLIC PANEL - Abnormal; Notable for the following:    Glucose, Bld 147 (*)    Calcium 8.6 (*)    All other components within normal limits  GLUCOSE, CAPILLARY - Abnormal; Notable for the following:    Glucose-Capillary 274 (*)    All other components within normal limits  GLUCOSE, CAPILLARY - Abnormal; Notable for the following:    Glucose-Capillary 128 (*)    All other components within normal limits  CBG MONITORING, ED - Abnormal; Notable for the following:    Glucose-Capillary 454 (*)    All other components within normal limits  CBG MONITORING, ED - Abnormal; Notable for the following:    Glucose-Capillary 425 (*)    All other components within normal limits  CBG MONITORING, ED - Abnormal; Notable for the following:    Glucose-Capillary 407 (*)    All other components within normal limits  MRSA PCR SCREENING  GLUCOSE, CAPILLARY  GLUCOSE, CAPILLARY  BASIC METABOLIC PANEL  BASIC METABOLIC PANEL    Imaging Review No  results found. I have personally reviewed and evaluated these images and lab results as part of my medical decision-making.   EKG Interpretation None      MDM   Final diagnoses:  Type 1 diabetes mellitus with ketoacidosis and without coma  Intractable vomiting with nausea, vomiting of unspecified type   Patient presents with uncontrolled diabetes and clinically dehydrated. Concern for gastroparesis versus DKA. Plan for IV fluid boluses, blood work, urinalysis and reassessment.  Multiple IV fluid boluses. IV insulin started in the ER. [facility].  The patients results and plan were reviewed and discussed.   Any x-rays performed were independently reviewed by myself.   Differential diagnosis were considered with the presenting HPI.  Medications  lisinopril (PRINIVIL,ZESTRIL) tablet 10 mg (not administered)  0.9 %  sodium chloride infusion ( Intravenous New  Bag/Given 06/07/15 2130)  0.9 %  sodium chloride infusion (not administered)  dextrose 5 %-0.45 % sodium chloride infusion ( Intravenous New Bag/Given 06/07/15 2220)  insulin regular (NOVOLIN R,HUMULIN R) 250 Units in sodium chloride 0.9 % 250 mL (1 Units/mL) infusion (0 Units/hr Intravenous Hold 06/07/15 2308)  Influenza vac split quadrivalent PF (FLUARIX) injection 0.5 mL (not administered)  sodium chloride 0.9 % bolus 1,000 mL (0 mLs Intravenous Stopped 06/07/15 1800)  sodium chloride 0.9 % bolus 1,000 mL (1,000 mLs Intravenous Transfusing/Transfer 06/07/15 2021)  potassium chloride 10 mEq in 100 mL IVPB (10 mEq Intravenous Given 06/07/15 2221)  metoprolol (LOPRESSOR) injection 5 mg (5 mg Intravenous Given 06/07/15 2128)    Filed Vitals:   06/07/15 1920 06/07/15 2000 06/07/15 2049 06/07/15 2100  BP: 192/119 138/81 171/95 169/103  Pulse: 106 103  117  Temp: 97.9 F (36.6 C)  97.7 F (36.5 C)   TempSrc: Oral  Oral   Resp: 20  13 19   Height:   6\' 1"  (1.854 m)   Weight:   147 lb 0.8 oz (66.7 kg)   SpO2: 100% 98%  100%    Final diagnoses:  Type 1 diabetes mellitus with ketoacidosis and without coma  Intractable vomiting with nausea, vomiting of unspecified type  HTN  Admission/ observation were discussed with the admitting physician, patient and/or family and they are comfortable with the plan.       Elnora Morrison, MD 06/08/15 [phone]

## 2015-06-07 NOTE — ED Notes (Signed)
Ice chips given per request with MD permission.

## 2015-06-07 NOTE — H&P (Signed)
History and Physical  MARCK MALLIS [id number] DOB: [birth year] DOA: 06/07/2015  Referring physician: Dr Reather Converse, ED physician PCP: Maricela Curet, MD   Chief Complaint: Nausea and vomiting, elevated blood sugar  HPI: Nathaniel [name] is a 29 y.o. male  With a history of Type 1 diabetes with gastroparesis, hypertension, hyperlipidemia. Patient presents with onset of nausea and vomiting, epigastric belly pain and hyperglycemia this morning. Pain has been worsening. Patient took his morning insulin, which did not help. The patient has not checked his blood sugar today.  Yesterday his blood sugar was elevated in the 200 range. He takes Levemir 70 units twice daily, but does not take any pre-meal or sliding scale insulin here. He has been relatively noncompliant with his diet.  No pain or provoking factors to his belly pain.   Review of Systems:   Pt complains of thirst.  Pt denies any fevers, chills, diarrhea, constipation, shortness of breath, dyspnea on exertion, orthopnea, cough, wheezing, palpitations, headache, vision changes, lightheadedness, dizziness, melena, rectal bleeding.  Review of systems are otherwise negative  Past Medical History  Diagnosis Date  . Diabetes mellitus     lantus/novolog  . Tobacco abuse     5/day  . Marijuana abuse     occaisionally  . Hypertension   . Hyperlipidemia   . Noncompliance with medication regimen    Past Surgical History  Procedure Laterality Date  . Eye surgery     Social History:  reports that he has been smoking Cigarettes.  He has a 8 pack-year smoking history. He has never used smokeless tobacco. He reports that he uses illicit drugs (Marijuana). He reports that he does not drink alcohol. Patient lives at home & is able to participate in activities of daily living   Allergies  Allergen Reactions  . Orange     Increases blood sugar immediately     Family History  Problem Relation Age of Onset  . Diabetes  Father   . Hypertension Mother   . Kidney failure Father     last 4 mnths of life      Prior to Admission medications   Medication Sig Start Date End Date Taking? Authorizing Provider  ibuprofen (ADVIL,MOTRIN) 200 MG tablet Take 200 mg by mouth every 6 (six) hours as needed for mild pain or moderate pain.   Yes Historical Provider, MD  LEVEMIR FLEXTOUCH 100 UNIT/ML Pen INJECT 70UNITS SUBCUTNEOUSLY TWICE A DAY 03/17/15  Yes Historical Provider, MD  lisinopril (PRINIVIL,ZESTRIL) 10 MG tablet Take 1 tablet (10 mg total) by mouth daily. 04/04/15  Yes [name], MD    Physical Exam: BP 138/81 mmHg  Pulse 103  Temp(Src) 97.9 F (36.6 C) (Oral)  Resp 20  Ht 6\' 1"  (1.854 m)  Wt 158 lb (71.668 kg)  BMI 20.85 kg/m2  SpO2 98%  General: Young black male. Awake and alert and oriented x3. No acute cardiopulmonary distress.  Eyes: Pupils equal, round, reactive to light. Extraocular muscles are intact. Sclerae anicteric and noninjected.  ENT: Dry mucosal membranes. No mucosal lesions. Teeth in good repair  Neck: Neck supple without lymphadenopathy. No carotid bruits. No masses palpated.  Cardiovascular: Regular rate with normal S1-S2 sounds. No murmurs, rubs, gallops auscultated. No JVD.  Respiratory: Good respiratory effort with no wheezes, rales, rhonchi. Lungs clear to auscultation bilaterally.  Abdomen: Soft, nontender, nondistended. Active bowel sounds. No masses or hepatosplenomegaly  Skin: Dry, warm to touch. 2+ dorsalis pedis and radial pulses. Musculoskeletal: No calf or leg  pain. All major joints not erythematous nontender.  Psychiatric: Intact judgment and insight.  Neurologic: No focal neurological deficits. Cranial nerves II through XII are grossly intact.           Labs on Admission:  Basic Metabolic Panel:  Recent Labs Lab 06/07/15 1709  NA 139  K 4.2  CL 95*  CO2 22  GLUCOSE 503*  BUN 20  CREATININE 1.47*  CALCIUM 10.2   Liver Function Tests: No results  for input(s): AST, ALT, ALKPHOS, BILITOT, PROT, ALBUMIN in the last 168 hours. No results for input(s): LIPASE, AMYLASE in the last 168 hours. No results for input(s): AMMONIA in the last 168 hours. CBC:  Recent Labs Lab 06/07/15 1709  WBC 17.2*  NEUTROABS 14.8*  HGB 14.8  HCT 42.0  MCV 89.6  PLT 283   Cardiac Enzymes: No results for input(s): CKTOTAL, CKMB, CKMBINDEX, TROPONINI in the last 168 hours.  BNP (last 3 results) No results for input(s): BNP in the last 8760 hours.  ProBNP (last 3 results) No results for input(s): PROBNP in the last 8760 hours.  CBG:  Recent Labs Lab 06/07/15 1641 06/07/15 1902 06/07/15 1948  GLUCAP [id number]* 425* 407*    Radiological Exams on Admission: No results found.  Assessment/Plan Present on Admission:  . DKA (diabetic ketoacidoses)  This patient was discussed with the ED physician, including pertinent vitals, physical exam findings, labs, and imaging.  We also discussed care given by the ED provider.  #1 DKA  Admit to stepdown  Glucomander with insulin drip  CBG every hour  Metabolic panel every 4 hours  IV fluids - 125 mL's per hour normal saline until blood sugar is 200s, then changed to D5 normal saline at 125 ML's per hour  2 runs of IV potassium   DVT prophylaxis: Patient is low risk - ambulation  Consultants: None  Code Status: Full code  Family Communication: Mother in room   Disposition Plan: Admit to stepdown   Truett Mainland, DO [facility] Pager [phone]

## 2015-06-07 NOTE — Progress Notes (Signed)
eLink Physician-Brief Progress Note Patient Name: [name] DOB: 1986-02-15 MRN: [id number]   Date of Service  06/07/2015  HPI/Events of Note  New admit w/ DKA & ARF. On insulin gtt & BMP q4hr.  eICU Interventions  Pt remains in ICU.     Intervention Category Evaluation Type: New Patient Evaluation  Tera Partridge 06/07/2015, 8:58 PM

## 2015-06-07 NOTE — ED Notes (Signed)
AC called for insulin drip.

## 2015-06-08 DIAGNOSIS — E1010 Type 1 diabetes mellitus with ketoacidosis without coma: Secondary | ICD-10-CM | POA: Diagnosis not present

## 2015-06-08 LAB — GLUCOSE, CAPILLARY
GLUCOSE-CAPILLARY: 103 mg/dL — AB (ref 65–99)
GLUCOSE-CAPILLARY: 12 mg/dL — AB (ref 65–99)
GLUCOSE-CAPILLARY: 13 mg/dL — AB (ref 65–99)
GLUCOSE-CAPILLARY: 113 mg/dL — AB (ref 65–99)
GLUCOSE-CAPILLARY: 50 mg/dL — AB (ref 65–99)
GLUCOSE-CAPILLARY: 133 mg/dL — AB (ref 65–99)
GLUCOSE-CAPILLARY: 58 mg/dL — AB (ref 65–99)
GLUCOSE-CAPILLARY: 83 mg/dL (ref 65–99)
GLUCOSE-CAPILLARY: 112 mg/dL — AB (ref 65–99)
GLUCOSE-CAPILLARY: 45 mg/dL — AB (ref 65–99)
Glucose-Capillary: 109 mg/dL — ABNORMAL HIGH (ref 65–99)
Glucose-Capillary: 156 mg/dL — ABNORMAL HIGH (ref 65–99)
Glucose-Capillary: 50 mg/dL — ABNORMAL LOW (ref 65–99)
Glucose-Capillary: 50 mg/dL — ABNORMAL LOW (ref 65–99)
Glucose-Capillary: 95 mg/dL (ref 65–99)

## 2015-06-08 LAB — BASIC METABOLIC PANEL
Anion gap: 11 (ref 5–15)
Anion gap: 8 (ref 5–15)
BUN: 15 mg/dL (ref 6–20)
BUN: 16 mg/dL (ref 6–20)
CHLORIDE: 104 mmol/L (ref 101–111)
CHLORIDE: 107 mmol/L (ref 101–111)
CO2: 23 mmol/L (ref 22–32)
CO2: 26 mmol/L (ref 22–32)
CREATININE: 1.09 mg/dL (ref 0.61–1.24)
CREATININE: 1.12 mg/dL (ref 0.61–1.24)
Calcium: 8.4 mg/dL — ABNORMAL LOW (ref 8.9–10.3)
Calcium: 8.4 mg/dL — ABNORMAL LOW (ref 8.9–10.3)
GFR calc non Af Amer: >60 mL/min (ref >60)
GFR calc non Af Amer: >60 mL/min (ref >60)
Glucose, Bld: 108 mg/dL — ABNORMAL HIGH (ref 65–99)
Glucose, Bld: 46 mg/dL — ABNORMAL LOW (ref 65–99)
POTASSIUM: 3.1 mmol/L — AB (ref 3.5–5.1)
Potassium: 3.7 mmol/L (ref 3.5–5.1)
Sodium: 138 mmol/L (ref 135–145)
Sodium: 141 mmol/L (ref 135–145)

## 2015-06-08 MED ORDER — DEXTROSE 50 % IV SOLN
25.0000 mL | Freq: Once | INTRAVENOUS | Status: AC
  Administered 2015-06-08 (×2): 25 mL via INTRAVENOUS

## 2015-06-08 MED ORDER — DEXTROSE 50 % IV SOLN
INTRAVENOUS | Status: AC
  Administered 2015-06-08: 50 mL
  Filled 2015-06-08: qty 50

## 2015-06-08 MED ORDER — DEXTROSE 50 % IV SOLN
INTRAVENOUS | Status: AC
  Filled 2015-06-08: qty 50

## 2015-06-08 MED ORDER — INSULIN DETEMIR 100 UNIT/ML ~~LOC~~ SOLN
SUBCUTANEOUS | Status: AC
  Filled 2015-06-08: qty 1

## 2015-06-08 MED ORDER — INSULIN ASPART 100 UNIT/ML ~~LOC~~ SOLN
4.0000 [IU] | Freq: Three times a day (TID) | SUBCUTANEOUS | Status: DC
  Administered 2015-06-09 (×2): 4 [IU] via SUBCUTANEOUS

## 2015-06-08 MED ORDER — ENOXAPARIN SODIUM 40 MG/0.4ML ~~LOC~~ SOLN
40.0000 mg | SUBCUTANEOUS | Status: DC
  Administered 2015-06-08 – 2015-06-11 (×4): 40 mg via SUBCUTANEOUS
  Filled 2015-06-08 (×4): qty 0.4

## 2015-06-08 MED ORDER — DEXTROSE-NACL 5-0.45 % IV SOLN
INTRAVENOUS | Status: DC
  Administered 2015-06-08: 18:00:00 via INTRAVENOUS

## 2015-06-08 MED ORDER — DEXTROSE 10 % IV SOLN
INTRAVENOUS | Status: DC
  Administered 2015-06-08: 22:00:00 via INTRAVENOUS
  Administered 2015-06-09: 1000 mL via INTRAVENOUS
  Administered 2015-06-10 (×2): via INTRAVENOUS

## 2015-06-08 MED ORDER — DEXTROSE 50 % IV SOLN
25.0000 mL | Freq: Once | INTRAVENOUS | Status: AC
  Administered 2015-06-08: 25 mL via INTRAVENOUS

## 2015-06-08 MED ORDER — INSULIN ASPART 100 UNIT/ML ~~LOC~~ SOLN
0.0000 [IU] | Freq: Three times a day (TID) | SUBCUTANEOUS | Status: DC
  Administered 2015-06-09: 11 [IU] via SUBCUTANEOUS

## 2015-06-08 MED ORDER — INSULIN ASPART 100 UNIT/ML ~~LOC~~ SOLN: 0.0000 [IU] | Freq: Every day | SUBCUTANEOUS | Status: DC

## 2015-06-08 MED ORDER — INSULIN DETEMIR 100 UNIT/ML ~~LOC~~ SOLN
70.0000 [IU] | Freq: Two times a day (BID) | SUBCUTANEOUS | Status: DC
  Administered 2015-06-08: 70 [IU] via SUBCUTANEOUS
  Filled 2015-06-08 (×4): qty 0.7

## 2015-06-08 NOTE — Progress Notes (Signed)
Hypoglycemic Event  CBG: 58 Treatment: D50 IV 50 mL  Symptoms: None  Follow-up CBG: Time: 1813 CBG Result: 156 Possible Reasons for Event: Unknown  Comments/MD notified: CBG 58, D5 50cc administered. Patient's BP continues to be elevated. Dr. Willey Blade notified. New order for D5 1/2 NS at 50cc/hr.    [name], Nathaniel  Remember to initiate Hypoglycemia Order Set & complete

## 2015-06-08 NOTE — Progress Notes (Addendum)
Hypoglycemic Event  CBG: 50  Treatment: D50 IV 101ml  Symptoms: None  Follow-up CBG: Time:1558 CBG Result: 133  Possible Reasons for Event: Unknown  Comments/MD notified: CBG 50, administer D50  recheck CBG 133. BP is elevated when sugar drops. Dr. Willey Blade notified. New orders to change CBG checks q2hrs.     [name], Nathaniel  Remember to initiate Hypoglycemia Order Set & complete

## 2015-06-08 NOTE — Progress Notes (Signed)
Hypoglycemic Event  CBG: 50  Treatment: 15 GM carbohydrate snack and D50 IV 25 mL  Symptoms: None  Follow-up CBG: Time: 1214 CBG Result: 50  Possible Reasons for Event: medication regimen   Comments/MD notified: CBG recheck 1235 109, BP elevated 212/116, recheck 184/106. Patient is tachy as well with heart rate to 120, it is now 90. Dr. Willey Blade notified. Orders to continue to monitor.    [name], Nathaniel  Remember to initiate Hypoglycemia Order Set & complete

## 2015-06-08 NOTE — Progress Notes (Signed)
Subjective: Nathaniel [name] is feeling better today. Vomiting has resolved. He denies abdominal pain. He is afebrile. Intravenous insulin was discontinued last night. He was given Levemir 70 units at about 1:30 this morning. He had taken 70 units of Levemir before coming to the hospital yesterday. He developed hypoglycemia this morning which was treated with D50. He is currently alert and in no distress. Glucose improved above 100 after treatment.  Objective: Vital signs in last 24 hours: Filed Vitals:   06/08/15 0700 06/08/15 0800 06/08/15 0804 06/08/15 0900  BP: 164/89 130/88  124/84  Pulse: 71 64  64  Temp:   97.7 F (36.5 C)   TempSrc:   Oral   Resp: 16     Height:      Weight:      SpO2: 100% 100%  100%   Weight change:   Intake/Output Summary (Last 24 hours) at 06/08/15 1003 Last data filed at 06/08/15 0630  Gross per 24 hour  Intake    960 ml  Output      0 ml  Net    960 ml    Physical Exam: Resting comfortably. HEENT unremarkable. Lungs clear. Heart regular in the 70s with no murmurs. Abdomen is soft and nontender. Extremities reveal no edema. Neuro grossly intact.  Lab Results:    Results for orders placed or performed during the hospital encounter of 06/07/15 (from the past 24 hour(s))  CBG monitoring, ED     Status: Abnormal   Collection Time: 06/07/15  4:41 PM  Result Value Ref Range   Glucose-Capillary 454 (H) 65 - 99 mg/dL  CBC with Differential/Platelet     Status: Abnormal   Collection Time: 06/07/15  5:09 PM  Result Value Ref Range   WBC 17.2 (H) 4.0 - 10.5 K/uL   RBC 4.69 4.22 - 5.81 MIL/uL   Hemoglobin 14.8 13.0 - 17.0 g/dL   HCT 42.0 39.0 - 52.0 %   MCV 89.6 78.0 - 100.0 fL   MCH 31.6 26.0 - 34.0 pg   MCHC 35.2 30.0 - 36.0 g/dL   RDW 13.4 11.5 - 15.5 %   Platelets 283 150 - 400 K/uL   Neutrophils Relative % 86 %   Neutro Abs 14.8 (H) 1.7 - 7.7 K/uL   Lymphocytes Relative 12 %   Lymphs Abs 2.0 0.7 - 4.0 K/uL   Monocytes Relative 2 %   Monocytes  Absolute 0.3 0.1 - 1.0 K/uL   Eosinophils Relative 0 %   Eosinophils Absolute 0.0 0.0 - 0.7 K/uL   Basophils Relative 0 %   Basophils Absolute 0.1 0.0 - 0.1 K/uL  Basic metabolic panel     Status: Abnormal   Collection Time: 06/07/15  5:09 PM  Result Value Ref Range   Sodium 139 135 - 145 mmol/L   Potassium 4.2 3.5 - 5.1 mmol/L   Chloride 95 (L) 101 - 111 mmol/L   CO2 22 22 - 32 mmol/L   Glucose, Bld 503 (H) 65 - 99 mg/dL   BUN 20 6 - 20 mg/dL   Creatinine, Ser 1.47 (H) 0.61 - 1.24 mg/dL   Calcium 10.2 8.9 - 10.3 mg/dL   GFR calc non Af Amer >60 >60 mL/min   GFR calc Af Amer >60 >60 mL/min   Anion gap 22 (H) 5 - 15  Blood gas, venous     Status: Abnormal   Collection Time: 06/07/15  5:15 PM  Result Value Ref Range   FIO2 21.00    Delivery  systems ROOM AIR    pH, Ven 7.320 (H) 7.250 - 7.300   pCO2, Ven 41.4 (L) 45.0 - 50.0 mmHg   pO2, Ven 41.9 30.0 - 45.0 mmHg   Bicarbonate 20.1 20.0 - 24.0 mEq/L   TCO2 14.7 0 - 100 mmol/L   Acid-base deficit 4.3 (H) 0.0 - 2.0 mmol/L   O2 Saturation 72.1 %   Collection site VEIN    Drawn by NURSE    Sample type VEIN   Urinalysis, Routine w reflex microscopic (not at [facility])     Status: Abnormal   Collection Time: 06/07/15  6:15 PM  Result Value Ref Range   Color, Urine YELLOW YELLOW   APPearance CLEAR CLEAR   Specific Gravity, Urine 1.025 1.005 - 1.030   pH 6.0 5.0 - 8.0   Glucose, UA >1000 (A) NEGATIVE mg/dL   Hgb urine dipstick LARGE (A) NEGATIVE   Bilirubin Urine NEGATIVE NEGATIVE   Ketones, ur >80 (A) NEGATIVE mg/dL   Protein, ur >300 (A) NEGATIVE mg/dL   Urobilinogen, UA 0.2 0.0 - 1.0 mg/dL   Nitrite NEGATIVE NEGATIVE   Leukocytes, UA NEGATIVE NEGATIVE  Urine microscopic-add on     Status: Abnormal   Collection Time: 06/07/15  6:15 PM  Result Value Ref Range   Squamous Epithelial / LPF RARE RARE   WBC, UA 3-6 <3 WBC/hpf   RBC / HPF 21-50 <3 RBC/hpf   Bacteria, UA FEW (A) RARE  POC CBG, ED     Status: Abnormal   Collection  Time: 06/07/15  7:02 PM  Result Value Ref Range   Glucose-Capillary 425 (H) 65 - 99 mg/dL  CBG monitoring, ED     Status: Abnormal   Collection Time: 06/07/15  7:48 PM  Result Value Ref Range   Glucose-Capillary 407 (H) 65 - 99 mg/dL  MRSA PCR Screening     Status: None   Collection Time: 06/07/15  8:44 PM  Result Value Ref Range   MRSA by PCR NEGATIVE NEGATIVE  Glucose, capillary     Status: Abnormal   Collection Time: 06/07/15  9:20 PM  Result Value Ref Range   Glucose-Capillary 274 (H) 65 - 99 mg/dL   Comment 1 Notify RN   Glucose, capillary     Status: Abnormal   Collection Time: 06/07/15 10:08 PM  Result Value Ref Range   Glucose-Capillary 128 (H) 65 - 99 mg/dL   Comment 1 Notify RN   Basic metabolic panel (stat then every 4 hours)     Status: Abnormal   Collection Time: 06/07/15 10:24 PM  Result Value Ref Range   Sodium 143 135 - 145 mmol/L   Potassium 4.1 3.5 - 5.1 mmol/L   Chloride 107 101 - 111 mmol/L   CO2 25 22 - 32 mmol/L   Glucose, Bld 147 (H) 65 - 99 mg/dL   BUN 18 6 - 20 mg/dL   Creatinine, Ser 1.22 0.61 - 1.24 mg/dL   Calcium 8.6 (L) 8.9 - 10.3 mg/dL   GFR calc non Af Amer >60 >60 mL/min   GFR calc Af Amer >60 >60 mL/min   Anion gap 11 5 - 15  Glucose, capillary     Status: None   Collection Time: 06/07/15 11:07 PM  Result Value Ref Range   Glucose-Capillary 90 65 - 99 mg/dL  Glucose, capillary     Status: None   Collection Time: 06/08/15 12:00 AM  Result Value Ref Range   Glucose-Capillary 95 65 - 99 mg/dL  Comment 1 Notify RN   Basic metabolic panel (stat then every 4 hours)     Status: Abnormal   Collection Time: 06/08/15 12:58 AM  Result Value Ref Range   Sodium 138 135 - 145 mmol/L   Potassium 3.7 3.5 - 5.1 mmol/L   Chloride 104 101 - 111 mmol/L   CO2 23 22 - 32 mmol/L   Glucose, Bld 108 (H) 65 - 99 mg/dL   BUN 16 6 - 20 mg/dL   Creatinine, Ser 1.12 0.61 - 1.24 mg/dL   Calcium 8.4 (L) 8.9 - 10.3 mg/dL   GFR calc non Af Amer >60 >60 mL/min    GFR calc Af Amer >60 >60 mL/min   Anion gap 11 5 - 15  Glucose, capillary     Status: Abnormal   Collection Time: 06/08/15  1:03 AM  Result Value Ref Range   Glucose-Capillary 103 (H) 65 - 99 mg/dL   Comment 1 Notify RN   Basic metabolic panel (stat then every 4 hours)     Status: Abnormal   Collection Time: 06/08/15  4:26 AM  Result Value Ref Range   Sodium 141 135 - 145 mmol/L   Potassium 3.1 (L) 3.5 - 5.1 mmol/L   Chloride 107 101 - 111 mmol/L   CO2 26 22 - 32 mmol/L   Glucose, Bld 46 (L) 65 - 99 mg/dL   BUN 15 6 - 20 mg/dL   Creatinine, Ser 1.09 0.61 - 1.24 mg/dL   Calcium 8.4 (L) 8.9 - 10.3 mg/dL   GFR calc non Af Amer >60 >60 mL/min   GFR calc Af Amer >60 >60 mL/min   Anion gap 8 5 - 15  Glucose, capillary     Status: Abnormal   Collection Time: 06/08/15  7:18 AM  Result Value Ref Range   Glucose-Capillary 12 (LL) 65 - 99 mg/dL   Comment 1 Notify RN    Comment 2 Document in Chart   Glucose, capillary     Status: Abnormal   Collection Time: 06/08/15  7:24 AM  Result Value Ref Range   Glucose-Capillary 13 (LL) 65 - 99 mg/dL   Comment 1 Notify RN    Comment 2 Document in Chart   Glucose, capillary     Status: Abnormal   Collection Time: 06/08/15  7:34 AM  Result Value Ref Range   Glucose-Capillary <10 (LL) 65 - 99 mg/dL   Comment 1 Notify RN    Comment 2 Document in Chart   Glucose, capillary     Status: Abnormal   Collection Time: 06/08/15  7:56 AM  Result Value Ref Range   Glucose-Capillary 113 (H) 65 - 99 mg/dL   Comment 1 Notify RN    Comment 2 Document in Chart      ABGS  Recent Labs  06/07/15 1715  TCO2 14.7  HCO3 20.1   CULTURES Recent Results (from the past 240 hour(s))  MRSA PCR Screening     Status: None   Collection Time: 06/07/15  8:44 PM  Result Value Ref Range Status   MRSA by PCR NEGATIVE NEGATIVE Final    Comment:        The GeneXpert MRSA Assay (FDA approved for NASAL specimens only), is one component of a comprehensive MRSA  colonization surveillance program. It is not intended to diagnose MRSA infection nor to guide or monitor treatment for MRSA infections.    Studies/Results: No results found. Micro Results: Recent Results (from the past 240 hour(s))  MRSA  PCR Screening     Status: None   Collection Time: 06/07/15  8:44 PM  Result Value Ref Range Status   MRSA by PCR NEGATIVE NEGATIVE Final    Comment:        The GeneXpert MRSA Assay (FDA approved for NASAL specimens only), is one component of a comprehensive MRSA colonization surveillance program. It is not intended to diagnose MRSA infection nor to guide or monitor treatment for MRSA infections.    Studies/Results: No results found. Medications:  I have reviewed the patient's current medications Scheduled Meds: . insulin aspart  0-15 Units Subcutaneous TID WC  . insulin aspart  0-5 Units Subcutaneous QHS  . insulin aspart  4 Units Subcutaneous TID WC  . lisinopril  10 mg Oral Daily   Continuous Infusions: . sodium chloride 125 mL/hr at 06/08/15 0947   PRN Meds:.   Assessment/Plan: #1. Diabetic ketoacidosis. His pH was mildly low and ketones were present in the urine. He had a normal bicarbonate on his metabolic panel. He has responded well to fluids and insulin. Since he has developed hypoglycemia Levemir will be held for now. Glucose will be followed and sliding scale NovoLog will be used as needed. Continue IV fluids. He states that his usual dose of Levemir 70 units twice a day. He uses NovoLog by sliding scale also. Active Problems:   DKA (diabetic ketoacidoses)     LOS: 1 day   Jahliyah Trice 06/08/2015, 10:03 AM

## 2015-06-08 NOTE — Progress Notes (Signed)
eLink Physician-Brief Progress Note Patient Name: Nathaniel [name] DOB: 16-Sep-1986 MRN: [id number]   Date of Service  06/08/2015  HPI/Events of Note  Best Practice  eICU Interventions  Order for SCDs while in bed and not ambulatory     Intervention Category Intermediate Interventions: Best-practice therapies (e.g. DVT, beta blocker, etc.)  DETERDING,ELIZABETH 06/08/2015, 12:21 AM

## 2015-06-08 NOTE — Progress Notes (Signed)
Hypoglycemic Event  CBG: 12 Treatment: 15 GM carbohydrate snack and D50 IV 25 mL  Symptoms: None  Follow-up CBG: Time: 0724 CBG Result: 13  Possible Reasons for Event: medication regimen'   Comments/MD notified: Patient CBG rechecked 0756 113. Dr. Willey Blade notified. Patient alert, slightly slow to respond.      Nathaniel [name]  Remember to initiate Hypoglycemia Order Set & complete

## 2015-06-09 LAB — BASIC METABOLIC PANEL
Anion gap: 8 (ref 5–15)
BUN: 6 mg/dL (ref 6–20)
CALCIUM: 8.7 mg/dL — AB (ref 8.9–10.3)
CHLORIDE: 104 mmol/L (ref 101–111)
CO2: 26 mmol/L (ref 22–32)
CREATININE: 1.01 mg/dL (ref 0.61–1.24)
GFR calc non Af Amer: >60 mL/min (ref >60)
GLUCOSE: 86 mg/dL (ref 65–99)
Potassium: 3.3 mmol/L — ABNORMAL LOW (ref 3.5–5.1)
Sodium: 138 mmol/L (ref 135–145)

## 2015-06-09 LAB — GLUCOSE, CAPILLARY
GLUCOSE-CAPILLARY: 315 mg/dL — AB (ref 65–99)
GLUCOSE-CAPILLARY: 76 mg/dL (ref 65–99)
GLUCOSE-CAPILLARY: 91 mg/dL (ref 65–99)
GLUCOSE-CAPILLARY: 79 mg/dL (ref 65–99)
GLUCOSE-CAPILLARY: 103 mg/dL — AB (ref 65–99)
Glucose-Capillary: 107 mg/dL — ABNORMAL HIGH (ref 65–99)
Glucose-Capillary: 176 mg/dL — ABNORMAL HIGH (ref 65–99)
Glucose-Capillary: 198 mg/dL — ABNORMAL HIGH (ref 65–99)
Glucose-Capillary: 242 mg/dL — ABNORMAL HIGH (ref 65–99)
Glucose-Capillary: 269 mg/dL — ABNORMAL HIGH (ref 65–99)
Glucose-Capillary: 49 mg/dL — ABNORMAL LOW (ref 65–99)
Glucose-Capillary: 77 mg/dL (ref 65–99)

## 2015-06-09 MED ORDER — POTASSIUM CHLORIDE CRYS ER 20 MEQ PO TBCR
20.0000 meq | EXTENDED_RELEASE_TABLET | Freq: Every day | ORAL | Status: AC
  Administered 2015-06-09 – 2015-06-10 (×2): 20 meq via ORAL
  Filled 2015-06-09 (×2): qty 1

## 2015-06-09 MED ORDER — INSULIN DETEMIR 100 UNIT/ML ~~LOC~~ SOLN
45.0000 [IU] | Freq: Two times a day (BID) | SUBCUTANEOUS | Status: DC
  Administered 2015-06-09 (×2): 45 [IU] via SUBCUTANEOUS
  Filled 2015-06-09 (×5): qty 0.45

## 2015-06-09 NOTE — Progress Notes (Signed)
Patient not eating but denies abdominal pain urged to eat will resume Levemir reduced dosage of 45 before meals twice a day with concomitant sliding scale coverage potassium 3.3 will replete orally with 20 mg by mouth twice a day check be met in a.[name]. Nathaniel [name]:[id number] DOB: Jul 24, 1986 DOA: 06/07/2015 PCP: Nathaniel Curet, MD             Physical Exam: Blood pressure 128/85, pulse 86, temperature 98.4 F (36.9 C), temperature source Oral, resp. rate 14, height '6\' 1"'  (1.854 [name]), weight 156 lb 4.9 oz (70.9 kg), SpO2 99 %. lungs clear to A&P no rales wheeze rhonchi heart regular rhythm no murmurs goes heaves thrills rubs abdomen soft nontender bowel sounds normoactive no guarding or rebound masses no megaly   Investigations:  Recent Results (from the past 240 hour(s))  MRSA PCR Screening     Status: None   Collection Time: 06/07/15  8:44 PM  Result Value Ref Range Status   MRSA by PCR NEGATIVE NEGATIVE Final    Comment:        The GeneXpert MRSA Assay (FDA approved for NASAL specimens only), is one component of a comprehensive MRSA colonization surveillance program. It is not intended to diagnose MRSA infection nor to guide or monitor treatment for MRSA infections.      Basic Metabolic Panel:  Recent Labs  06/08/15 0426 06/09/15 0010  NA 141 138  K 3.1* 3.3*  CL 107 104  CO2 26 26  GLUCOSE 46* 86  BUN 15 6  CREATININE 1.09 1.01  CALCIUM 8.4* 8.7*   Liver Function Tests: No results for input(s): AST, ALT, ALKPHOS, BILITOT, PROT, ALBUMIN in the last 72 hours.   CBC:  Recent Labs  06/07/15 1709  WBC 17.2*  NEUTROABS 14.8*  HGB 14.8  HCT 42.0  MCV 89.6  PLT 283    No results found.    Medications:  Impression: Hypertension Hyperlipidemia Active Problems:   DKA (diabetic ketoacidoses)     Plan: Resume Levemir 45 twice a day reduced dosage resume Pravachol 40 mg by mouth daily monitor be met in a.[name]. give KCl 20 mg by mouth  twice a day  Consultants:    Procedures  Antibiotics:                   Code Status:  Family Communication:    Disposition Plan   Time spent: 30 minutes   LOS: 2 days   Nathaniel [name]   06/09/2015, 11:21 AM

## 2015-06-09 NOTE — Progress Notes (Signed)
Inpatient Diabetes Program Recommendations  AACE/ADA: New Consensus Statement on Inpatient Glycemic Control (2015)  Target Ranges:  Prepandial:   less than 140 mg/dL      Peak postprandial:   less than 180 mg/dL (1-2 hours)      Critically ill patients:  140 - 180 mg/dL  Results for DJIMON, STUTES (MRN [id number]) as of 06/09/2015 08:25  Ref. Range 06/09/2015 00:01 06/09/2015 02:11 06/09/2015 04:31 06/09/2015 06:15 06/09/2015 07:33  Glucose-Capillary Latest Ref Range: 65-99 mg/dL 77 176 (H) 242 (H) 269 (H) 315 (H)   Results for JONATHN, SANTOLI (MRN [id number]) as of 06/09/2015 08:25  Ref. Range 06/08/2015 07:18 06/08/2015 07:24 06/08/2015 07:34 06/08/2015 07:56 06/08/2015 11:53 06/08/2015 12:14 06/08/2015 12:32 06/08/2015 15:24 06/08/2015 15:58 06/08/2015 17:50 06/08/2015 18:13 06/08/2015 19:56 06/08/2015 21:38 06/08/2015 22:02  Glucose-Capillary Latest Ref Range: 65-99 mg/dL 12 (LL) 13 (LL) <10 (LL) 113 (H) 50 (L) 50 (L) 109 (H) 50 (L) 133 (H) 58 (L) 156 (H) 83 45 (L) 112 (H)   Review of Glycemic Control  Outpatient Diabetes medications: Levemir 70 units BID (please note pt was seen by me on 04/03/15 and stated that he frequently skipped Levemir doses if his glucose was okay and he was having frequent hypoglycemia when he took Levemir), Novolog per sliding scale (per Dr. Ria Comment note on 06/08/15 but not listed on home medication list) Current orders for Inpatient glycemic control: Novolog 0-15 units TID with meals, Novolog 0-5 units QHS, Novolog 4 units TID with meals  Inpatient Diabetes Program Recommendations: Insulin - Basal: Noted patient received Levemir 70 units on 06/08/15 at 1:02 am and patient experienced several hypoglycemia episodes yesterday. No other basal insulin has been given since then and now glucose is up to 315 mg/dl this morning. Please consider ordering Levemir 21 units Q24H starting now (based on 71 kg x 0.3 units). IV fluids: Please re-evaluate need for D10 @ ml/hr. A1C: Please  consider adding an A1C to blood in lab to evaluate glycemic control over the past 2-3 months. Outpatient Referral: Please consider referring patient to Dr. Dorris Fetch for assistance with improving glycemic control.  Thanks, Barnie Alderman, RN, MSN, CCRN, CDE Diabetes Coordinator Inpatient Diabetes Program [phone] (Team Pager from [place] to [place]) [phone] ([facility] office) [phone] [facility] office) [phone] [facility] office)

## 2015-06-09 NOTE — Clinical Documentation Improvement (Addendum)
  Internal Medicine  Possible clinical conditions:  - Acute Kidney Injury  - Other condition  - Unable to clinically determine  Supporting information: Creatinine has improved 0.35 mg/dL in 24 hours and 0.46 mg/dL in 48 hours.  Component     Latest Ref Rng 06/07/2015 06/07/2015 06/08/2015 06/08/2015         5:09 PM 10:24 PM 12:58 AM  4:26 AM  BUN     6 - 20 mg/dL 20 18 16 15   Creatinine     0.61 - 1.24 mg/dL 1.47 (H) 1.22 1.12 1.09   Component     Latest Ref Rng 06/09/2015          BUN     6 - 20 mg/dL 6  Creatinine     0.61 - 1.24 mg/dL 1.01    (If you agree with a condition, please document your response in the progress notes and not on the query form itself.)   Thank You,  Erling Conte  RN BSN CCDS [place]

## 2015-06-09 NOTE — Progress Notes (Signed)
Pt's CBG is 49. Pt is asymptomatic and he states that usually he can feel when his blood sugar drops. Pt given grape juice. Accu check will be checked again shortly.

## 2015-06-09 NOTE — Care Management Note (Signed)
Case Management Note  Patient Details  Name: Nathaniel [name] MRN: [id number] Date of Birth: May 13, 1986  Expected Discharge Date:    06/10/2015              Expected Discharge Plan:  Home/Self Care  In-House Referral:  NA  Discharge planning Services  CM Consult  Post Acute Care Choice:  NA Choice offered to:  NA  DME Arranged:    DME Agency:     HH Arranged:    [place] Agency:     Status of Service:  Completed, signed off  Medicare Important Message Given:    Date Medicare IM Given:    Medicare IM give by:    Date Additional Medicare IM Given:    Additional Medicare Important Message give by:     If discussed at [place] of Stay Meetings, dates discussed:    Additional Comments: Pt is from home, ind at baseline. Pt admitted with DKA. Pt plans to return home with self care at DC. No CM needs noted.  Sherald Barge, RN 06/09/2015, 1:59 PM

## 2015-06-10 LAB — BASIC METABOLIC PANEL
ANION GAP: 5 (ref 5–15)
BUN: 9 mg/dL (ref 6–20)
CALCIUM: 8.6 mg/dL — AB (ref 8.9–10.3)
CO2: 29 mmol/L (ref 22–32)
CREATININE: 0.95 mg/dL (ref 0.61–1.24)
Chloride: 103 mmol/L (ref 101–111)
GFR calc Af Amer: >60 mL/min (ref >60)
GLUCOSE: 58 mg/dL — AB (ref 65–99)
Potassium: 3.1 mmol/L — ABNORMAL LOW (ref 3.5–5.1)
Sodium: 137 mmol/L (ref 135–145)

## 2015-06-10 LAB — GLUCOSE, CAPILLARY
GLUCOSE-CAPILLARY: 108 mg/dL — AB (ref 65–99)
GLUCOSE-CAPILLARY: 158 mg/dL — AB (ref 65–99)
GLUCOSE-CAPILLARY: 118 mg/dL — AB (ref 65–99)
GLUCOSE-CAPILLARY: 174 mg/dL — AB (ref 65–99)
GLUCOSE-CAPILLARY: 312 mg/dL — AB (ref 65–99)
Glucose-Capillary: 132 mg/dL — ABNORMAL HIGH (ref 65–99)
Glucose-Capillary: 145 mg/dL — ABNORMAL HIGH (ref 65–99)
Glucose-Capillary: 33 mg/dL — CL (ref 65–99)
Glucose-Capillary: 53 mg/dL — ABNORMAL LOW (ref 65–99)
Glucose-Capillary: 55 mg/dL — ABNORMAL LOW (ref 65–99)
Glucose-Capillary: 73 mg/dL (ref 65–99)

## 2015-06-10 MED ORDER — INSULIN DETEMIR 100 UNIT/ML ~~LOC~~ SOLN
60.0000 [IU] | Freq: Two times a day (BID) | SUBCUTANEOUS | Status: DC
  Filled 2015-06-10 (×2): qty 0.6

## 2015-06-10 MED ORDER — PRAVASTATIN SODIUM 40 MG PO TABS
40.0000 mg | ORAL_TABLET | Freq: Every day | ORAL | Status: DC
  Administered 2015-06-10: 40 mg via ORAL
  Filled 2015-06-10: qty 1

## 2015-06-10 MED ORDER — SODIUM CHLORIDE 0.9 % IV SOLN
INTRAVENOUS | Status: DC
  Administered 2015-06-10: 1000 mL via INTRAVENOUS
  Administered 2015-06-10: 21:00:00 via INTRAVENOUS

## 2015-06-10 MED ORDER — INSULIN ASPART 100 UNIT/ML ~~LOC~~ SOLN
0.0000 [IU] | SUBCUTANEOUS | Status: DC
  Administered 2015-06-10: 7 [IU] via SUBCUTANEOUS
  Administered 2015-06-10 – 2015-06-11 (×2): 2 [IU] via SUBCUTANEOUS
  Administered 2015-06-11: 1 [IU] via SUBCUTANEOUS

## 2015-06-10 MED ORDER — POTASSIUM CHLORIDE CRYS ER 20 MEQ PO TBCR
20.0000 meq | EXTENDED_RELEASE_TABLET | Freq: Every day | ORAL | Status: AC
  Administered 2015-06-10 – 2015-06-11 (×2): 20 meq via ORAL
  Filled 2015-06-10 (×2): qty 1

## 2015-06-10 MED ORDER — DEXTROSE 50 % IV SOLN
INTRAVENOUS | Status: AC
  Administered 2015-06-10: 50 mL
  Filled 2015-06-10: qty 50

## 2015-06-10 MED ORDER — PRAVASTATIN SODIUM 40 MG PO TABS: 40.0000 mg | ORAL_TABLET | Freq: Every day | ORAL | Status: DC

## 2015-06-10 MED ORDER — INSULIN DETEMIR 100 UNIT/ML ~~LOC~~ SOLN
40.0000 [IU] | Freq: Two times a day (BID) | SUBCUTANEOUS | Status: DC
  Filled 2015-06-10 (×2): qty 0.4

## 2015-06-10 MED ORDER — INSULIN DETEMIR 100 UNIT/ML ~~LOC~~ SOLN
40.0000 [IU] | Freq: Every day | SUBCUTANEOUS | Status: DC
  Administered 2015-06-10: 40 [IU] via SUBCUTANEOUS
  Filled 2015-06-10 (×2): qty 0.4

## 2015-06-10 NOTE — Progress Notes (Signed)
CBG 35. Hypoglycemic protocol initiiated. Dr. [name] called with no answer. Message was left. Will continue to monitor.

## 2015-06-10 NOTE — Progress Notes (Signed)
Pt CBG 55. Hypoglycemic protocol initiated. Pt is asymptomatic. Will continue to monitor.

## 2015-06-10 NOTE — Discharge Summary (Signed)
Physician Discharge Summary  Nathaniel [name] [id number] DOB: 05/01/1986 DOA: 06/07/2015  PCP: Maricela Curet, MD  Admit date: 06/07/2015 Discharge date: 06/10/2015   Recommendations for Outpatient Follow-up:  Follow-up in office in 4 days time to assess electrolytes glycemic control and volume status requires was reinstituted on his Pravachol 40 mg by mouth daily this he was told to continue to take Discharge Diagnoses:  Active Problems:   DKA (diabetic ketoacidoses)   Discharge Condition: Good and improving  Filed Weights   06/08/15 0500 06/09/15 0500 06/10/15 0404  Weight: 156 lb 4.9 oz (70.9 kg) 156 lb 4.9 oz (70.9 kg) 156 lb 8.4 oz (71 kg)    History of present illness:  This type I juvenile diabetic 29 years old with history of noncompliance early community with DKA rescue of IV depletion intractable nausea and vomiting and with a history of gastroparesis patient profile Glucomander protocol IV insulin and his electrolytes were monitored renal function monitored and he was volume repleted he had some low potassium was given oral potassium and this seemed to improve by day of discharge he had his Levemir recent 2 days at two thirds level and was in good glycemic control subsequently discharged on the aforementioned medicines from admission with the addition of his Pravachol which she had somehow stopped along the way Hospital Course:  See history of present illness above  Procedures:    Consultations:  Discharge Instructions  Discharge Instructions    Discharge instructions    Complete by:  As directed      Discharge patient    Complete by:  As directed             Medication List    STOP taking these medications        ibuprofen 200 MG tablet  Commonly known as:  ADVIL,MOTRIN      TAKE these medications        LEVEMIR FLEXTOUCH 100 UNIT/ML Pen  Generic drug:  Insulin Detemir  INJECT 70UNITS SUBCUTNEOUSLY TWICE A DAY     lisinopril 10 MG  tablet  Commonly known as:  PRINIVIL,ZESTRIL  Take 1 tablet (10 mg total) by mouth daily.     pravastatin 40 MG tablet  Commonly known as:  PRAVACHOL  Take 1 tablet (40 mg total) by mouth daily at 6 PM.       Allergies  Allergen Reactions  . Orange     Increases blood sugar immediately       The results of significant diagnostics from this hospitalization (including imaging, microbiology, ancillary and laboratory) are listed below for reference.    Significant Diagnostic Studies: No results found.  Microbiology: Recent Results (from the past 240 hour(s))  MRSA PCR Screening     Status: None   Collection Time: 06/07/15  8:44 PM  Result Value Ref Range Status   MRSA by PCR NEGATIVE NEGATIVE Final    Comment:        The GeneXpert MRSA Assay (FDA approved for NASAL specimens only), is one component of a comprehensive MRSA colonization surveillance program. It is not intended to diagnose MRSA infection nor to guide or monitor treatment for MRSA infections.      Labs: Basic Metabolic Panel:  Recent Labs Lab 06/07/15 2224 06/08/15 0058 06/08/15 0426 06/09/15 0010 06/10/15 0531  NA 143 138 141 138 137  K 4.1 3.7 3.1* 3.3* 3.1*  CL 107 104 107 104 103  CO2 25 23 26 26 29   GLUCOSE 147* 108*  46* 86 58*  BUN 18 16 15 6 9   CREATININE 1.22 1.12 1.09 1.01 0.95  CALCIUM 8.6* 8.4* 8.4* 8.7* 8.6*   Liver Function Tests: No results for input(s): AST, ALT, ALKPHOS, BILITOT, PROT, ALBUMIN in the last 168 hours. No results for input(s): LIPASE, AMYLASE in the last 168 hours. No results for input(s): AMMONIA in the last 168 hours. CBC:  Recent Labs Lab 06/07/15 1709  WBC 17.2*  NEUTROABS 14.8*  HGB 14.8  HCT 42.0  MCV 89.6  PLT 283   Cardiac Enzymes: No results for input(s): CKTOTAL, CKMB, CKMBINDEX, TROPONINI in the last 168 hours. BNP: BNP (last 3 results) No results for input(s): BNP in the last 8760 hours.  ProBNP (last 3 results) No results for  input(s): PROBNP in the last 8760 hours.  CBG:  Recent Labs Lab 06/10/15 0406 06/10/15 0509 06/10/15 0634 06/10/15 0726 06/10/15 1042  GLUCAP 53* 73 55* 158* 118*       Signed:  [name],[name]  [facility] Pager: [phone] 06/10/2015, 11:29 AM

## 2015-06-10 NOTE — Progress Notes (Signed)
Nathaniel [name] KPT:[id number] DOB: 03/26/1986 DOA: 06/07/2015 PCP: Maricela Curet, MD             Physical Exam: Blood pressure 145/87, pulse 91, temperature 98.3 F (36.8 C), temperature source Oral, resp. rate 19, height _0  (1.854 m), weight 156 lb 8.4 oz (71 kg), SpO2 94 %. lungs clear to A&P no rales wheeze rhonchi heart regular rhythm no murmurs goes heaves or rubs abdomen soft nontender bowel sounds normoactive no guarding or rebound masses no megaly  Investigations:  Recent Results (from the past 240 hour(s))  MRSA PCR Screening     Status: None   Collection Time: 06/07/15  8:44 PM  Result Value Ref Range Status   MRSA by PCR NEGATIVE NEGATIVE Final    Comment:        The GeneXpert MRSA Assay (FDA approved for NASAL specimens only), is one component of a comprehensive MRSA colonization surveillance program. It is not intended to diagnose MRSA infection nor to guide or monitor treatment for MRSA infections.      Basic Metabolic Panel:  Recent Labs  06/09/15 0010 06/10/15 0531  NA 138 137  K 3.3* 3.1*  CL 104 103  CO2 26 29  GLUCOSE 86 58*  BUN 6 9  CREATININE 1.01 0.95  CALCIUM 8.7* 8.6*   Liver Function Tests: No results for input(s): AST, ALT, ALKPHOS, BILITOT, PROT, ALBUMIN in the last 72 hours.   CBC:  Recent Labs  06/07/15 1709  WBC 17.2*  NEUTROABS 14.8*  HGB 14.8  HCT 42.0  MCV 89.6  PLT 283    No results found.    Medications:  Impression: Active Problems:   DKA (diabetic ketoacidoses)     Plan: She'll stay on the 2448 hrs. give Levemir cc twice a day 40 units decrease insulin to sensitive sliding scale and get rid of D10 and start 0.9 normal saline continued potassium oral replenishment 20 mEq by mouth twice a day check be met in a.m.  Consultants:   Procedures   Antibiotics:                   Code Status:   Family Communication:    Disposition Plan Levemir 40 mg Levemir 40 units subcutaneous  before meals twice a day KCl 20 no clonus by mouth twice a day chickpea met in a.m. change D10 2 0.9 normal saline decrease sliding scale to sensitive  Time spent: 40 minutes   LOS: 3 days   [name],[name]   06/10/2015, 11:47 AM

## 2015-06-10 NOTE — Progress Notes (Addendum)
Inpatient Diabetes Program Recommendations  AACE/ADA: New Consensus Statement on Inpatient Glycemic Control (2015)  Target Ranges:  Prepandial:   less than 140 mg/dL      Peak postprandial:   less than 180 mg/dL (1-2 hours)      Critically ill patients:  140 - 180 mg/dL   Results for Nathaniel [name], Nathaniel [name] (MRN [id number]) as of 06/10/2015 07:43  Ref. Range 06/09/2015 07:33 06/09/2015 10:37 06/09/2015 12:36 06/09/2015 14:46 06/09/2015 16:57 06/09/2015 19:43 06/09/2015 20:25 06/09/2015 21:48 06/10/2015 00:06 06/10/2015 01:17 06/10/2015 02:14 06/10/2015 04:06 06/10/2015 05:09 06/10/2015 06:34  Glucose-Capillary Latest Ref Range: 65-99 mg/dL 315 (H) 76 107 (H) 198 (H) 91 49 (L) 79 103 (H) 33 (LL) 145 (H) 108 (H) 53 (L) 73 55 (L)    Review of Glycemic Control  Outpatient Diabetes medications: Levemir 70 units BID (please note pt was seen by me on 04/03/15 and stated that he frequently skipped Levemir doses if his glucose was okay and he was having frequent hypoglycemia when he took Levemir), Novolog per sliding scale (per Dr. Ria Comment note on 06/08/15 but not listed on home medication list) Current orders for Inpatient glycemic control: Levemir 45 units BID, Novolog 0-15 units TID with meals, Novolog 0-5 units QHS, Novolog 4 units TID with meals  Inpatient Diabetes Program Recommendations: Insulin - Basal: Noted patient received Levemir 70 units on 06/08/15 at 1:02 am and patient experienced several hypoglycemia episodes yesterday. Basal insulin was changed to Levemir 45 units BID on 9/19 and patient has experienced 4 hypoglycemia episodes over the past 12 hours.  Please consider decreasing Levemir to 21 units QHS starting tonight (based on 71 kg x 0.3 units). A1C: Please consider adding an A1C to blood in lab to evaluate glycemic control over the past 2-3 months. Outpatient Referral: Please consider referring patient to Dr. Dorris Fetch for assistance with improving glycemic control.  Thanks, Barnie Alderman, RN, MSN, CCRN,  CDE Diabetes Coordinator Inpatient Diabetes Program [phone] (Team Pager from [place] to [place]) [phone] ([place] office) [phone] [facility] office) [phone] [facility] office)

## 2015-06-11 LAB — GLUCOSE, CAPILLARY
GLUCOSE-CAPILLARY: 88 mg/dL (ref 65–99)
GLUCOSE-CAPILLARY: 23 mg/dL — AB (ref 65–99)
Glucose-Capillary: 130 mg/dL — ABNORMAL HIGH (ref 65–99)
Glucose-Capillary: 137 mg/dL — ABNORMAL HIGH (ref 65–99)
Glucose-Capillary: 162 mg/dL — ABNORMAL HIGH (ref 65–99)

## 2015-06-11 LAB — BASIC METABOLIC PANEL
ANION GAP: 7 (ref 5–15)
BUN: 11 mg/dL (ref 6–20)
CHLORIDE: 107 mmol/L (ref 101–111)
CO2: 25 mmol/L (ref 22–32)
CREATININE: 0.91 mg/dL (ref 0.61–1.24)
Calcium: 8.6 mg/dL — ABNORMAL LOW (ref 8.9–10.3)
GFR calc non Af Amer: >60 mL/min (ref >60)
Glucose, Bld: 103 mg/dL — ABNORMAL HIGH (ref 65–99)
POTASSIUM: 3.1 mmol/L — AB (ref 3.5–5.1)
SODIUM: 139 mmol/L (ref 135–145)

## 2015-06-11 MED ORDER — DEXTROSE 50 % IV SOLN
INTRAVENOUS | Status: AC
  Administered 2015-06-11: 50 mL
  Filled 2015-06-11: qty 50

## 2015-06-11 MED ORDER — INSULIN DETEMIR 100 UNIT/ML ~~LOC~~ SOLN
30.0000 [IU] | Freq: Two times a day (BID) | SUBCUTANEOUS | Status: DC
  Filled 2015-06-11 (×2): qty 0.3

## 2015-06-11 MED ORDER — POTASSIUM CHLORIDE CRYS ER 20 MEQ PO TBCR: 20.0000 meq | EXTENDED_RELEASE_TABLET | Freq: Every day | ORAL | Status: DC

## 2015-06-11 MED ORDER — LEVEMIR FLEXTOUCH 100 UNIT/ML ~~LOC~~ SOPN
PEN_INJECTOR | SUBCUTANEOUS | Status: DC
Start: 2015-06-11 — End: 2016-04-17

## 2015-06-11 NOTE — Progress Notes (Signed)
CBG 137 after receiving 1 amp dextrose IV.

## 2015-06-11 NOTE — Progress Notes (Signed)
CBG 23 this am. Pt alert and oriented, asymptomatic. VSS. 1amp Dextrose given IV.

## 2015-06-11 NOTE — Care Management Important Message (Signed)
Important Message  Patient Details  Name: EBONY SYVERTSON MRN: [id number] Date of Birth: 1986-01-16   Medicare Important Message Given:  Yes-second notification given    Sherald Barge, RN 06/11/2015, 2:20 PM

## 2015-06-11 NOTE — Care Management Note (Signed)
Case Management Note  Patient Details  Name: BENJAMIN FRANKHOUSER MRN: [id number] Date of Birth: [birth year]  Expected Discharge Date:                  Expected Discharge Plan:  Home/Self Care  In-House Referral:  NA  Discharge planning Services  CM Consult  Post Acute Care Choice:  NA Choice offered to:  NA  DME Arranged:    DME Agency:     HH Arranged:    [place] Agency:     Status of Service:  Completed, signed off  Medicare Important Message Given:  Yes-second notification given Date Medicare IM Given:    Medicare IM give by:    Date Additional Medicare IM Given:    Additional Medicare Important Message give by:     If discussed at [place] of Stay Meetings, dates discussed:    Additional Comments: Pt discharged home today with self care. No CM needs.  Sherald Barge, RN 06/11/2015, 2:21 PM

## 2015-06-11 NOTE — Discharge Summary (Signed)
Physician Discharge Summary  Nathaniel [name] [id number] DOB: 26-Nov-1985 DOA: 06/07/2015  PCP: Maricela Curet, MD  Admit date: 06/07/2015 Discharge date: 06/11/2015   Recommendations for Outpatient Follow-up:  The patient is advised on today's discharge to yesterday's was canceled take 30 units of Levemir before meals twice a day pravastatin supper and to take 20 mEq of KCl twice a day for 7 days vomiting office for 5 days time to check electrolytes glycemic control he's likewise as the addition of Pravachol 40 and lisinopril 10 Discharge Diagnoses:  Active Problems:   DKA (diabetic ketoacidoses)   Discharge Condition: Good  Filed Weights   06/09/15 0500 06/10/15 0404 06/11/15 0500  Weight: 156 lb 4.9 oz (70.9 kg) 156 lb 8.4 oz (71 kg) 166 lb 10.7 oz (75.6 kg)    History of present illness:  This was admitted with intravascular by repletion recurrent nausea and vomiting etiology undetermined or three-day period he had DKA upon admission and this was rectified with intravenous Glucomander protocol intravenous fluids for replenishment of electrolytes perispinal well began eating he had a couple of episodes of hyperglycemia was placed on D10 and his discharge was canceled yesterday not knowing that he was on D10 from previous night before assess his glycemic control on 0.9 normal saline at a reduced sliding scale insulin his glucose control was good one episode of hypoglycemia patient stayed denied much breakfast he was urged to eat both. No abdominal pain and he was given 40 mEq of KCl prior to discharge  Hospital Course:  See history of present illness  Procedures:   Consultations:    Discharge Instructions  Discharge Instructions    Discharge instructions    Complete by:  As directed      Discharge instructions    Complete by:  As directed      Discharge patient    Complete by:  As directed      Discharge patient    Complete by:  As directed               Medication List    STOP taking these medications        ibuprofen 200 MG tablet  Commonly known as:  ADVIL,MOTRIN      TAKE these medications        LEVEMIR FLEXTOUCH 100 UNIT/ML Pen  Generic drug:  Insulin Detemir  INJECT [place]. Bid , Breakfast and supper.     lisinopril 10 MG tablet  Commonly known as:  PRINIVIL,ZESTRIL  Take 1 tablet (10 mg total) by mouth daily.     potassium chloride SA 20 MEQ tablet  Commonly known as:  K-DUR,KLOR-CON  Take 1 tablet (20 mEq total) by mouth daily.     pravastatin 40 MG tablet  Commonly known as:  PRAVACHOL  Take 1 tablet (40 mg total) by mouth daily at 6 PM.       Allergies  Allergen Reactions  . Orange     Increases blood sugar immediately       The results of significant diagnostics from this hospitalization (including imaging, microbiology, ancillary and laboratory) are listed below for reference.    Significant Diagnostic Studies: No results found.  Microbiology: Recent Results (from the past 240 hour(s))  MRSA PCR Screening     Status: None   Collection Time: 06/07/15  8:44 PM  Result Value Ref Range Status   MRSA by PCR NEGATIVE NEGATIVE Final    Comment:  The GeneXpert MRSA Assay (FDA approved for NASAL specimens only), is one component of a comprehensive MRSA colonization surveillance program. It is not intended to diagnose MRSA infection nor to guide or monitor treatment for MRSA infections.      Labs: Basic Metabolic Panel:  Recent Labs Lab 06/08/15 0058 06/08/15 0426 06/09/15 0010 06/10/15 0531 06/11/15 0432  NA 138 141 138 137 139  K 3.7 3.1* 3.3* 3.1* 3.1*  CL 104 107 104 103 107  CO2 23 26 26 29 25   GLUCOSE 108* 46* 86 58* 103*  BUN 16 15 6 9 11   CREATININE 1.12 1.09 1.01 0.95 0.91  CALCIUM 8.4* 8.4* 8.7* 8.6* 8.6*   Liver Function Tests: No results for input(s): AST, ALT, ALKPHOS, BILITOT, PROT, ALBUMIN in the last 168 hours. No results for input(s):  LIPASE, AMYLASE in the last 168 hours. No results for input(s): AMMONIA in the last 168 hours. CBC:  Recent Labs Lab 06/07/15 1709  WBC 17.2*  NEUTROABS 14.8*  HGB 14.8  HCT 42.0  MCV 89.6  PLT 283   Cardiac Enzymes: No results for input(s): CKTOTAL, CKMB, CKMBINDEX, TROPONINI in the last 168 hours. BNP: BNP (last 3 results) No results for input(s): BNP in the last 8760 hours.  ProBNP (last 3 results) No results for input(s): PROBNP in the last 8760 hours.  CBG:  Recent Labs Lab 06/10/15 2357 06/11/15 0422 06/11/15 0734 06/11/15 0809 06/11/15 1130  GLUCAP 162* 88 23* 137* 130*       Signed:  Nyan Dufresne [name]  [facility] Pager: [phone] 06/11/2015, 12:57 PM

## 2015-11-19 ENCOUNTER — Other Ambulatory Visit (HOSPITAL_COMMUNITY): Payer: Self-pay | Admitting: Family Medicine

## 2015-11-19 ENCOUNTER — Ambulatory Visit (HOSPITAL_COMMUNITY)
Admission: RE | Admit: 2015-11-19 | Discharge: 2015-11-19 | Disposition: A | Discharge status: Home / Self Care | Payer: Medicare Other | Source: Ambulatory Visit | Attending: Family Medicine | Admitting: Family Medicine

## 2015-11-19 DIAGNOSIS — R29898 Other symptoms and signs involving the musculoskeletal system: Secondary | ICD-10-CM | POA: Diagnosis not present

## 2015-11-19 DIAGNOSIS — R531 Weakness: Secondary | ICD-10-CM | POA: Diagnosis present

## 2015-11-22 ENCOUNTER — Emergency Department (HOSPITAL_COMMUNITY): Payer: Medicare Other

## 2015-11-22 ENCOUNTER — Encounter (HOSPITAL_COMMUNITY): Payer: Self-pay

## 2015-11-22 ENCOUNTER — Emergency Department (HOSPITAL_COMMUNITY)
Admission: EM | Admit: 2015-11-22 | Discharge: 2015-11-22 | Disposition: A | Discharge status: Home / Self Care | Payer: Medicare Other | Attending: Emergency Medicine | Admitting: Emergency Medicine

## 2015-11-22 DIAGNOSIS — R52 Pain, unspecified: Secondary | ICD-10-CM

## 2015-11-22 DIAGNOSIS — F17210 Nicotine dependence, cigarettes, uncomplicated: Secondary | ICD-10-CM | POA: Insufficient documentation

## 2015-11-22 DIAGNOSIS — E785 Hyperlipidemia, unspecified: Secondary | ICD-10-CM | POA: Diagnosis not present

## 2015-11-22 DIAGNOSIS — Z794 Long term (current) use of insulin: Secondary | ICD-10-CM | POA: Diagnosis not present

## 2015-11-22 DIAGNOSIS — R109 Unspecified abdominal pain: Secondary | ICD-10-CM

## 2015-11-22 DIAGNOSIS — R1013 Epigastric pain: Secondary | ICD-10-CM | POA: Diagnosis not present

## 2015-11-22 DIAGNOSIS — I10 Essential (primary) hypertension: Secondary | ICD-10-CM | POA: Insufficient documentation

## 2015-11-22 DIAGNOSIS — R112 Nausea with vomiting, unspecified: Secondary | ICD-10-CM | POA: Insufficient documentation

## 2015-11-22 DIAGNOSIS — E119 Type 2 diabetes mellitus without complications: Secondary | ICD-10-CM | POA: Insufficient documentation

## 2015-11-22 DIAGNOSIS — Z79899 Other long term (current) drug therapy: Secondary | ICD-10-CM | POA: Insufficient documentation

## 2015-11-22 LAB — CBC WITH DIFFERENTIAL/PLATELET
BASOS PCT: 0 %
Basophils Absolute: 0 10*3/uL (ref 0.0–0.1)
EOS ABS: 0.1 10*3/uL (ref 0.0–0.7)
Eosinophils Relative: 0 %
HCT: 38.2 % — ABNORMAL LOW (ref 39.0–52.0)
HEMOGLOBIN: 13.2 g/dL (ref 13.0–17.0)
LYMPHS ABS: 2.4 10*3/uL (ref 0.7–4.0)
Lymphocytes Relative: 21 %
MCH: 30.1 pg (ref 26.0–34.0)
MCHC: 34.6 g/dL (ref 30.0–36.0)
MCV: 87.2 fL (ref 78.0–100.0)
Monocytes Absolute: 0.5 10*3/uL (ref 0.1–1.0)
Monocytes Relative: 4 %
NEUTROS PCT: 75 %
Neutro Abs: 8.4 10*3/uL — ABNORMAL HIGH (ref 1.7–7.7)
Platelets: 238 10*3/uL (ref 150–400)
RBC: 4.38 MIL/uL (ref 4.22–5.81)
RDW: 13.7 % (ref 11.5–15.5)
WBC: 11.4 10*3/uL — AB (ref 4.0–10.5)

## 2015-11-22 LAB — URINALYSIS, ROUTINE W REFLEX MICROSCOPIC
Bilirubin Urine: NEGATIVE
KETONES UR: 15 mg/dL — AB
LEUKOCYTES UA: NEGATIVE
NITRITE: NEGATIVE
PROTEIN: 100 mg/dL — AB
Specific Gravity, Urine: 1.015 (ref 1.005–1.030)
pH: 6.5 (ref 5.0–8.0)

## 2015-11-22 LAB — COMPREHENSIVE METABOLIC PANEL
ALBUMIN: 3.6 g/dL (ref 3.5–5.0)
ALK PHOS: 71 U/L (ref 38–126)
ALT: 17 U/L (ref 17–63)
AST: 19 U/L (ref 15–41)
Anion gap: 10 (ref 5–15)
BUN: 22 mg/dL — AB (ref 6–20)
CALCIUM: 9.2 mg/dL (ref 8.9–10.3)
CO2: 29 mmol/L (ref 22–32)
CREATININE: 1.09 mg/dL (ref 0.61–1.24)
Chloride: 93 mmol/L — ABNORMAL LOW (ref 101–111)
GFR calc Af Amer: >60 mL/min (ref >60)
GFR calc non Af Amer: >60 mL/min (ref >60)
GLUCOSE: 396 mg/dL — AB (ref 65–99)
Potassium: 3.6 mmol/L (ref 3.5–5.1)
SODIUM: 132 mmol/L — AB (ref 135–145)
Total Bilirubin: 1 mg/dL (ref 0.3–1.2)
Total Protein: 7.3 g/dL (ref 6.5–8.1)

## 2015-11-22 LAB — CBG MONITORING, ED
Glucose-Capillary: 357 mg/dL — ABNORMAL HIGH (ref 65–99)
Glucose-Capillary: 313 mg/dL — ABNORMAL HIGH (ref 65–99)
Glucose-Capillary: 228 mg/dL — ABNORMAL HIGH (ref 65–99)

## 2015-11-22 LAB — URINE MICROSCOPIC-ADD ON
BACTERIA UA: NONE SEEN
SQUAMOUS EPITHELIAL / LPF: NONE SEEN
WBC UA: NONE SEEN WBC/hpf (ref 0–5)

## 2015-11-22 MED ORDER — ONDANSETRON HCL 4 MG/2ML IJ SOLN
4.0000 mg | Freq: Once | INTRAMUSCULAR | Status: AC
  Administered 2015-11-22: 4 mg via INTRAVENOUS
  Filled 2015-11-22: qty 2

## 2015-11-22 MED ORDER — PANTOPRAZOLE SODIUM 40 MG IV SOLR
40.0000 mg | Freq: Once | INTRAVENOUS | Status: AC
  Administered 2015-11-22: 40 mg via INTRAVENOUS
  Filled 2015-11-22: qty 40

## 2015-11-22 MED ORDER — SODIUM CHLORIDE 0.9 % IV BOLUS (SEPSIS)
1000.0000 mL | Freq: Once | INTRAVENOUS | Status: AC
  Administered 2015-11-22: 1000 mL via INTRAVENOUS

## 2015-11-22 MED ORDER — INSULIN ASPART 100 UNIT/ML ~~LOC~~ SOLN
5.0000 [IU] | Freq: Once | SUBCUTANEOUS | Status: AC
  Administered 2015-11-22: 5 [IU] via SUBCUTANEOUS
  Filled 2015-11-22: qty 1

## 2015-11-22 MED ORDER — SODIUM CHLORIDE 0.9 % IV BOLUS (SEPSIS)
1000.0000 mL | Freq: Once | INTRAVENOUS | Status: AC
Start: 2015-11-22 — End: 2015-11-22
  Administered 2015-11-22: 1000 mL via INTRAVENOUS

## 2015-11-22 MED ORDER — TRAMADOL HCL 50 MG PO TABS: 50.0000 mg | ORAL_TABLET | Freq: Four times a day (QID) | ORAL | Status: DC | PRN

## 2015-11-22 MED ORDER — PANTOPRAZOLE SODIUM 20 MG PO TBEC: 20.0000 mg | DELAYED_RELEASE_TABLET | Freq: Every day | ORAL | Status: DC

## 2015-11-22 MED ORDER — KETOROLAC TROMETHAMINE 30 MG/ML IJ SOLN
30.0000 mg | Freq: Once | INTRAMUSCULAR | Status: AC
  Administered 2015-11-22: 30 mg via INTRAVENOUS
  Filled 2015-11-22: qty 1

## 2015-11-22 NOTE — ED Provider Notes (Signed)
CSN: [id number]     Arrival date & time 11/22/15  0945 History  By signing my name below, I, Rayna Sexton, attest that this documentation has been prepared under the direction and in the presence of Milton Ferguson, MD. Electronically Signed: Rayna Sexton, ED Scribe. 11/22/2015. 10:39 AM.    Chief Complaint  Patient presents with  . Abdominal Pain   Patient is a 30 y.o. male presenting with abdominal pain. The history is provided by the patient. No language interpreter was used.  Abdominal Pain Pain location:  Epigastric Pain severity:  Moderate Onset quality:  Sudden Duration:  1 day Timing:  Constant Progression:  Unchanged Chronicity:  Recurrent Associated symptoms: nausea and vomiting   Associated symptoms: no chest pain, no cough, no diarrhea, no fatigue and no hematuria     HPI Comments: Nathaniel [name] is a 30 y.o. male with a PMHx including IDDM and DKA who presents to the Emergency Department complaining of constant, moderate, epigastric pain onset 1 day ago. He reports associated, moderate, intermittent, n/v. He reports a hx of similar symptoms when experiencing elevated blood glucose levels and has been hospitalized for this in the past. His PCP is Dr. [name]. Pt denies diarrhea or any other associated symptoms at this time.   Past Medical History  Diagnosis Date  . Diabetes mellitus     lantus/novolog  . Tobacco abuse     5/day  . Marijuana abuse     occaisionally  . Hypertension   . Hyperlipidemia   . Noncompliance with medication regimen    Past Surgical History  Procedure Laterality Date  . Eye surgery     Family History  Problem Relation Age of Onset  . Diabetes Father   . Hypertension Mother   . Kidney failure Father     last 4 mnths of life   Social History  Substance Use Topics  . Smoking status: Current Every Day Smoker -- 1.00 packs/day for 8 years    Types: Cigarettes  . Smokeless tobacco: Never Used  . Alcohol Use: No    Review of  Systems  Constitutional: Negative for appetite change and fatigue.  HENT: Negative for congestion, ear discharge and sinus pressure.   Eyes: Negative for discharge.  Respiratory: Negative for cough.   Cardiovascular: Negative for chest pain.  Gastrointestinal: Positive for nausea, vomiting and abdominal pain. Negative for diarrhea.  Genitourinary: Negative for frequency and hematuria.  Musculoskeletal: Negative for back pain.  Skin: Negative for rash.  Allergic/Immunologic: Positive for immunocompromised state.  Neurological: Negative for seizures and headaches.  Psychiatric/Behavioral: Negative for hallucinations.    Allergies  Orange  Home Medications   Prior to Admission medications   Medication Sig Start Date End Date Taking? Authorizing Provider  LEVEMIR FLEXTOUCH 100 UNIT/ML Pen INJECT 30 UNITS SUBCUTNEOUSLY A.C. Bid , Breakfast and supper. 06/11/15   [name], MD  lisinopril (PRINIVIL,ZESTRIL) 10 MG tablet Take 1 tablet (10 mg total) by mouth daily. 04/04/15   [name], MD  potassium chloride SA (K-DUR,KLOR-CON) 20 MEQ tablet Take 1 tablet (20 mEq total) by mouth daily. 06/11/15   [name], MD  pravastatin (PRAVACHOL) 40 MG tablet Take 1 tablet (40 mg total) by mouth daily at 6 PM. 06/10/15   [name], MD   BP 191/118 mmHg  Pulse 106  Temp(Src) 98.6 F (37 C) (Oral)  Resp 20  Ht 6' (1.829 m)  Wt 158 lb (71.668 kg)  BMI 21.42 kg/m2  SpO2 100%  Physical Exam  Constitutional: He is oriented to person, place, and time. He appears well-developed.  HENT:  Head: Normocephalic.  Eyes: Conjunctivae and EOM are normal. No scleral icterus.  Neck: Neck supple. No thyromegaly present.  Cardiovascular: Normal rate and regular rhythm.  Exam reveals no gallop and no friction rub.   No murmur heard. Pulmonary/Chest: No stridor. He has no wheezes. He has no rales. He exhibits no tenderness.  Abdominal: He exhibits no distension. There is tenderness.  There is no rebound.  Minimal epigastric tenderness  Musculoskeletal: Normal range of motion. He exhibits no edema.  Lymphadenopathy:    He has no cervical adenopathy.  Neurological: He is oriented to person, place, and time. He exhibits normal muscle tone. Coordination normal.  Skin: No rash noted. No erythema.  Psychiatric: He has a normal mood and affect. His behavior is normal.    ED Course  Procedures  DIAGNOSTIC STUDIES: Oxygen Saturation is 100% on RA, normal by my interpretation.    COORDINATION OF CARE: 10:36 AM Discussed next steps with pt and he agreed to the plan.   Labs Review Labs Reviewed  CBG MONITORING, ED - Abnormal; Notable for the following:    Glucose-Capillary 357 (*)    All other components within normal limits  CBC WITH DIFFERENTIAL/PLATELET  COMPREHENSIVE METABOLIC PANEL  URINALYSIS, ROUTINE W REFLEX MICROSCOPIC (NOT AT [facility])    Imaging Review No results found. I have personally reviewed and evaluated these lab results as part of my medical decision-making.   EKG Interpretation None      MDM   Final diagnoses:  None   Patient with abdominal discomfort and uncontrolled diabetes. He was recently put on steroids and Naprosyn for shoulder and hip he supposedly may have had a pinched nerve in his neck. We will stop the Naprosyn and steroids and put him on protonic and Ultram as needed and he will follow-up with his PCP   Milton Ferguson, MD 11/22/15 1625

## 2015-11-22 NOTE — ED Notes (Signed)
Pt reports abd pain and n/v.  Reports history of dka.  Pt says he didn't check his sugar yesterday and can't remember what it was at the doctor's office yesterday.

## 2015-11-22 NOTE — Discharge Instructions (Signed)
Stop taking the steroids and the Naprosyn. Follow-up with your doctor as planned this week

## 2015-12-02 ENCOUNTER — Other Ambulatory Visit (HOSPITAL_COMMUNITY): Payer: Medicare Other

## 2016-01-04 ENCOUNTER — Emergency Department (HOSPITAL_COMMUNITY): Payer: Medicare Other

## 2016-01-04 ENCOUNTER — Inpatient Hospital Stay (HOSPITAL_COMMUNITY)
Admission: EM | Admit: 2016-01-04 | Discharge: 2016-01-06 | DRG: 638 | Discharge status: Against Medical Advice | Payer: Medicare Other | Attending: Family Medicine | Admitting: Family Medicine

## 2016-01-04 ENCOUNTER — Encounter (HOSPITAL_COMMUNITY): Payer: Self-pay | Admitting: *Deleted

## 2016-01-04 DIAGNOSIS — Z833 Family history of diabetes mellitus: Secondary | ICD-10-CM

## 2016-01-04 DIAGNOSIS — N179 Acute kidney failure, unspecified: Secondary | ICD-10-CM | POA: Diagnosis not present

## 2016-01-04 DIAGNOSIS — E1310 Other specified diabetes mellitus with ketoacidosis without coma: Secondary | ICD-10-CM

## 2016-01-04 DIAGNOSIS — F17210 Nicotine dependence, cigarettes, uncomplicated: Secondary | ICD-10-CM | POA: Diagnosis present

## 2016-01-04 DIAGNOSIS — R112 Nausea with vomiting, unspecified: Secondary | ICD-10-CM | POA: Diagnosis present

## 2016-01-04 DIAGNOSIS — E785 Hyperlipidemia, unspecified: Secondary | ICD-10-CM | POA: Diagnosis present

## 2016-01-04 DIAGNOSIS — K3184 Gastroparesis: Secondary | ICD-10-CM | POA: Diagnosis present

## 2016-01-04 DIAGNOSIS — I10 Essential (primary) hypertension: Secondary | ICD-10-CM | POA: Diagnosis present

## 2016-01-04 DIAGNOSIS — E1010 Type 1 diabetes mellitus with ketoacidosis without coma: Secondary | ICD-10-CM | POA: Diagnosis not present

## 2016-01-04 DIAGNOSIS — E1110 Type 2 diabetes mellitus with ketoacidosis without coma: Secondary | ICD-10-CM | POA: Diagnosis present

## 2016-01-04 DIAGNOSIS — R739 Hyperglycemia, unspecified: Secondary | ICD-10-CM | POA: Diagnosis not present

## 2016-01-04 DIAGNOSIS — E1043 Type 1 diabetes mellitus with diabetic autonomic (poly)neuropathy: Secondary | ICD-10-CM | POA: Diagnosis present

## 2016-01-04 DIAGNOSIS — E860 Dehydration: Secondary | ICD-10-CM | POA: Diagnosis present

## 2016-01-04 DIAGNOSIS — R000 Tachycardia, unspecified: Secondary | ICD-10-CM

## 2016-01-04 DIAGNOSIS — Z9114 Patient's other noncompliance with medication regimen: Secondary | ICD-10-CM

## 2016-01-04 DIAGNOSIS — Z794 Long term (current) use of insulin: Secondary | ICD-10-CM

## 2016-01-04 DIAGNOSIS — Z91148 Patient's other noncompliance with medication regimen for other reason: Secondary | ICD-10-CM

## 2016-01-04 LAB — COMPREHENSIVE METABOLIC PANEL
ALK PHOS: 82 U/L (ref 38–126)
ALT: 24 U/L (ref 17–63)
AST: 22 U/L (ref 15–41)
Albumin: 4.2 g/dL (ref 3.5–5.0)
Anion gap: 17 — ABNORMAL HIGH (ref 5–15)
BUN: 23 mg/dL — AB (ref 6–20)
CALCIUM: 9.3 mg/dL (ref 8.9–10.3)
CHLORIDE: 95 mmol/L — AB (ref 101–111)
CO2: 20 mmol/L — AB (ref 22–32)
CREATININE: 1.57 mg/dL — AB (ref 0.61–1.24)
GFR calc non Af Amer: 58 mL/min — ABNORMAL LOW (ref >60)
Glucose, Bld: 688 mg/dL (ref 65–99)
Potassium: 4.6 mmol/L (ref 3.5–5.1)
SODIUM: 132 mmol/L — AB (ref 135–145)
Total Bilirubin: 1.5 mg/dL — ABNORMAL HIGH (ref 0.3–1.2)
Total Protein: 8.1 g/dL (ref 6.5–8.1)

## 2016-01-04 LAB — CBC WITH DIFFERENTIAL/PLATELET
BASOS ABS: 0 10*3/uL (ref 0.0–0.1)
Basophils Relative: 0 %
EOS ABS: 0 10*3/uL (ref 0.0–0.7)
EOS PCT: 0 %
HCT: 39.5 % (ref 39.0–52.0)
HEMOGLOBIN: 13 g/dL (ref 13.0–17.0)
LYMPHS PCT: 12 %
Lymphs Abs: 1.3 10*3/uL (ref 0.7–4.0)
MCH: 29.4 pg (ref 26.0–34.0)
MCHC: 32.9 g/dL (ref 30.0–36.0)
MCV: 89.4 fL (ref 78.0–100.0)
Monocytes Absolute: 0.2 10*3/uL (ref 0.1–1.0)
Monocytes Relative: 2 %
NEUTROS ABS: 9.5 10*3/uL — AB (ref 1.7–7.7)
NEUTROS PCT: 86 %
PLATELETS: 252 10*3/uL (ref 150–400)
RBC: 4.42 MIL/uL (ref 4.22–5.81)
RDW: 13.8 % (ref 11.5–15.5)
WBC: 11 10*3/uL — AB (ref 4.0–10.5)

## 2016-01-04 LAB — URINALYSIS, ROUTINE W REFLEX MICROSCOPIC
BILIRUBIN URINE: NEGATIVE
Ketones, ur: >80 mg/dL — AB
Leukocytes, UA: NEGATIVE
Nitrite: NEGATIVE
PH: 5.5 (ref 5.0–8.0)
Protein, ur: 100 mg/dL — AB
SPECIFIC GRAVITY, URINE: 1.015 (ref 1.005–1.030)

## 2016-01-04 LAB — RAPID URINE DRUG SCREEN, HOSP PERFORMED
Amphetamines: NOT DETECTED
BENZODIAZEPINES: NOT DETECTED
Barbiturates: NOT DETECTED
COCAINE: NOT DETECTED
OPIATES: NOT DETECTED
TETRAHYDROCANNABINOL: POSITIVE — AB

## 2016-01-04 LAB — CBG MONITORING, ED
GLUCOSE-CAPILLARY: 455 mg/dL — AB (ref 65–99)
Glucose-Capillary: 214 mg/dL — ABNORMAL HIGH (ref 65–99)

## 2016-01-04 LAB — URINE MICROSCOPIC-ADD ON
BACTERIA UA: NONE SEEN
WBC, UA: NONE SEEN WBC/hpf (ref 0–5)

## 2016-01-04 LAB — BLOOD GAS, VENOUS
ACID-BASE DEFICIT: 5.1 mmol/L — AB (ref 0.0–2.0)
BICARBONATE: 19.9 meq/L — AB (ref 20.0–24.0)
Drawn by: 105551
FIO2: 0.21
O2 Saturation: 83 %
PCO2 VEN: 37.6 mmHg — AB (ref 45.0–50.0)
PH VEN: 7.338 — AB (ref 7.250–7.300)
PO2 VEN: 50.9 mmHg — AB (ref 31.0–45.0)

## 2016-01-04 LAB — LIPASE, BLOOD: Lipase: 17 U/L (ref 11–51)

## 2016-01-04 LAB — TROPONIN I: Troponin I: 0.03 ng/mL (ref <0.031)

## 2016-01-04 MED ORDER — SODIUM CHLORIDE 0.9 % IV SOLN
INTRAVENOUS | Status: DC
  Administered 2016-01-04: 4 [IU]/h via INTRAVENOUS
  Filled 2016-01-04: qty 2.5

## 2016-01-04 MED ORDER — LABETALOL HCL 5 MG/ML IV SOLN: 10.0000 mg | INTRAVENOUS | Status: DC | PRN

## 2016-01-04 MED ORDER — ONDANSETRON HCL 4 MG/2ML IJ SOLN
4.0000 mg | Freq: Once | INTRAMUSCULAR | Status: AC
  Administered 2016-01-04: 4 mg via INTRAVENOUS
  Filled 2016-01-04: qty 2

## 2016-01-04 MED ORDER — DEXTROSE-NACL 5-0.45 % IV SOLN: INTRAVENOUS | Status: DC

## 2016-01-04 MED ORDER — INSULIN ASPART 100 UNIT/ML ~~LOC~~ SOLN
10.0000 [IU] | Freq: Once | SUBCUTANEOUS | Status: AC
  Administered 2016-01-04: 10 [IU] via SUBCUTANEOUS
  Filled 2016-01-04: qty 1

## 2016-01-04 MED ORDER — SODIUM CHLORIDE 0.9 % IV SOLN
INTRAVENOUS | Status: AC
  Filled 2016-01-04: qty 2.5

## 2016-01-04 MED ORDER — SODIUM CHLORIDE 0.9 % IV SOLN: INTRAVENOUS | Status: DC

## 2016-01-04 MED ORDER — SODIUM CHLORIDE 0.9 % IV SOLN
1000.0000 mL | Freq: Once | INTRAVENOUS | Status: AC
  Administered 2016-01-04: 1000 mL via INTRAVENOUS

## 2016-01-04 MED ORDER — SODIUM CHLORIDE 0.9 % IV BOLUS (SEPSIS)
1000.0000 mL | Freq: Once | INTRAVENOUS | Status: AC
  Administered 2016-01-04: 1000 mL via INTRAVENOUS

## 2016-01-04 MED ORDER — PANTOPRAZOLE SODIUM 20 MG PO TBEC
20.0000 mg | DELAYED_RELEASE_TABLET | Freq: Every day | ORAL | Status: DC
  Filled 2016-01-04: qty 1

## 2016-01-04 MED ORDER — DEXTROSE-NACL 5-0.45 % IV SOLN
INTRAVENOUS | Status: DC
  Administered 2016-01-04: 23:00:00 via INTRAVENOUS

## 2016-01-04 MED ORDER — POTASSIUM CHLORIDE 10 MEQ/100ML IV SOLN
10.0000 meq | INTRAVENOUS | Status: AC
  Administered 2016-01-04 (×2): 10 meq via INTRAVENOUS
  Filled 2016-01-04: qty 100

## 2016-01-04 MED ORDER — ONDANSETRON HCL 4 MG/2ML IJ SOLN: 4.0000 mg | Freq: Four times a day (QID) | INTRAMUSCULAR | Status: DC | PRN

## 2016-01-04 MED ORDER — SODIUM CHLORIDE 0.9 % IV SOLN
1000.0000 mL | INTRAVENOUS | Status: DC
  Administered 2016-01-04: 1000 mL via INTRAVENOUS

## 2016-01-04 MED ORDER — ONDANSETRON HCL 4 MG PO TABS: 4.0000 mg | ORAL_TABLET | Freq: Four times a day (QID) | ORAL | Status: DC | PRN

## 2016-01-04 MED ORDER — SODIUM CHLORIDE 0.9 % IV SOLN
INTRAVENOUS | Status: DC
  Administered 2016-01-05: 08:00:00 via INTRAVENOUS

## 2016-01-04 NOTE — H&P (Signed)
PCP:   Maricela Curet, MD   Chief Complaint:  N/v/abd pain, high sugar  HPI: 30 yo male h/o type 1 DM comes in with nausea with associated abdominal pain and nonbloody vomiting today with suspicion of high sugar.  Pt ran out of his medications yesterday morning and his mom was suppose to pick up his refill yesterday on Saturday but did not for some reason.  Pt denies any recent illnesses.  Pt feeling better after ivf in the ED.  Has only vomited once since arrival.    Review of Systems:  Positive and negative as per HPI otherwise all other systems are negative  Past Medical History: Past Medical History  Diagnosis Date  . Diabetes mellitus     lantus/novolog  . Tobacco abuse     5/day  . Marijuana abuse     occaisionally  . Hypertension   . Hyperlipidemia   . Noncompliance with medication regimen    Past Surgical History  Procedure Laterality Date  . Eye surgery      Medications: Prior to Admission medications   Medication Sig Start Date End Date Taking? Authorizing Provider  BYDUREON 2 MG PEN Inject 2 mg into the skin every 7 (seven) days. 12/11/15  Yes Historical Provider, MD  LEVEMIR FLEXTOUCH 100 UNIT/ML Pen INJECT [place]. Bid , Breakfast and supper. Patient taking differently: Inject 60-70 Units into the skin 2 (two) times daily. 70units in the am and 60units at night 06/11/15  Yes [name], MD  lisinopril (PRINIVIL,ZESTRIL) 10 MG tablet Take 1 tablet (10 mg total) by mouth daily. 04/04/15  Yes [name], MD  naproxen (NAPROSYN) 500 MG tablet Take 500 mg by mouth 2 (two) times daily. 11/19/15  Yes Historical Provider, MD  pantoprazole (PROTONIX) 20 MG tablet Take 1 tablet (20 mg total) by mouth daily. 11/22/15  Yes Milton Ferguson, MD  pravastatin (PRAVACHOL) 40 MG tablet Take 1 tablet (40 mg total) by mouth daily at 6 PM. 06/10/15  Yes [name], MD  potassium chloride SA (K-DUR,KLOR-CON) 20 MEQ tablet Take 1 tablet (20 mEq total) by  mouth daily. Patient not taking: Reported on 11/22/2015 06/11/15   [name], MD  traMADol (ULTRAM) 50 MG tablet Take 1 tablet (50 mg total) by mouth every 6 (six) hours as needed. Patient not taking: Reported on 01/04/2016 11/22/15   Milton Ferguson, MD    Allergies:   Allergies  Allergen Reactions  . Orange     Increases blood sugar immediately     Social History:  reports that he has been smoking Cigarettes.  He has a 8 pack-year smoking history. He has never used smokeless tobacco. He reports that he uses illicit drugs (Marijuana). He reports that he does not drink alcohol.  Family History: Family History  Problem Relation Age of Onset  . Diabetes Father   . Hypertension Mother   . Kidney failure Father     last 4 mnths of life    Physical Exam: Filed Vitals:   01/04/16 1939 01/04/16 2030 01/04/16 2119 01/04/16 2130  BP: 211/116 200/105 147/84 174/91  Pulse: 102 104 110 108  Temp: 98 F (36.7 C)     TempSrc: Oral     Resp: 20 16 23 19   Height: 6\' 1"  (1.854 m)     Weight: 77.111 kg (170 lb)     SpO2: 100% 100% 100% 100%   General appearance: alert, cooperative and no distress Head: Normocephalic, without obvious abnormality, atraumatic Eyes: negative  Nose: Nares normal. Septum midline. Mucosa normal. No drainage or sinus tenderness. Neck: no JVD and supple, symmetrical, trachea midline Lungs: clear to auscultation bilaterally Heart: regular rate and rhythm, S1, S2 normal, no murmur, click, rub or gallop Abdomen: soft, non-tender; bowel sounds normal; no masses,  no organomegaly Extremities: extremities normal, atraumatic, no cyanosis or edema Pulses: 2+ and symmetric Skin: Skin color, texture, turgor normal. No rashes or lesions Neurologic: Grossly normal    Labs on Admission:   Recent Labs  01/04/16 2015  NA 132*  K 4.6  CL 95*  CO2 20*  GLUCOSE 688*  BUN 23*  CREATININE 1.57*  CALCIUM 9.3    Recent Labs  01/04/16 2015  AST 22  ALT 24   ALKPHOS 82  BILITOT 1.5*  PROT 8.1  ALBUMIN 4.2    Recent Labs  01/04/16 2015  LIPASE 17    Recent Labs  01/04/16 2015  WBC 11.0*  NEUTROABS 9.5*  HGB 13.0  HCT 39.5  MCV 89.4  PLT 252   Radiological Exams on Admission: Dg Chest 2 View  01/04/2016  CLINICAL DATA:  Abdominal pain.  Nausea and vomiting. EXAM: CHEST - 2 VIEW COMPARISON:  Acute abdominal series 11/22/2015. CT of the chest 01/09/2014. FINDINGS: The heart size and mediastinal contours are within normal limits. Both lungs are clear. The visualized skeletal structures are unremarkable. IMPRESSION: Negative two view chest x-ray Electronically Signed   By: San Morelle M.D.   On: 01/04/2016 20:48   Old records reviewed Case discussed with dr Dolly Rias in ED ekg reviewed sinus tach early repol cxr reviewed no edema or infiltrate  Assessment/Plan  30 yo male in DKA and uncontrolled hypertension due to running out of insulin yesterday and n/v  Principal Problem:   DKA (diabetic ketoacidoses) ([place])-  Given 3 liters of ivf bolus in ED.  Mild elevation of anion gap.  Serial bmp at every 4 hours.  Switch fluids to d5 once glucose is below 250.  Check hourly glucose unit AG clears.   Check hga1c.  Active Problems:   Severe uncontrolled hypertension-  Hold lisinopril due to mild aki.  Prn labetolol ordered   Nausea and vomiting- likely due to dka.  Resolving at this time.   Essential hypertension, benign-  Holding ace.  Prn laetolol   Gastroparesis-  If zofran does not help, consider reglan but pt is unaware of any diagnosis of gastroparesis, is not on reglan at home   AKI (acute kidney injury) ([place])-  Ivf, repeat in am   Noncompliance with medications - noted , due to running out of insulin, he is aware this is not a good idea.  obs on stepdown.  Full code.   Wilsie Kern A 01/04/2016, 9:38 PM

## 2016-01-04 NOTE — ED Notes (Signed)
CRITICAL VALUE ALERT  Critical value received:  Glucose  Date of notification:  01/04/16  Time of notification:  2109  Critical value read back:Yes.    Nurse who received alert:  Fabio Neighbors RN  MD notified (1st page):  Dr. Dayna Barker  Time of first page:  2110

## 2016-01-04 NOTE — ED Notes (Addendum)
Pt states he ran out of his insulin yesterday and woke up with abdominal pain and n/v earlier today and has been sick all day. Pt hasn't checked his sugar today. Pt believes he takes lantus and levemir.

## 2016-01-04 NOTE — ED Provider Notes (Signed)
CSN: [id number]     Arrival date & time 01/04/16  1915 History   First MD Initiated Contact with Patient 01/04/16 1950     Chief Complaint  Patient presents with  . Hyperglycemia     (Consider location/radiation/quality/duration/timing/severity/associated sxs/prior Treatment) Patient is a 30 y.o. male presenting with hyperglycemia.  Hyperglycemia Blood sugar level PTA:  >600 Severity:  Severe Onset quality:  Gradual Duration:  1 day Timing:  Constant Progression:  Worsening Chronicity:  Recurrent Diabetes status:  Controlled with insulin Context: noncompliance   Relieved by:  None tried Ineffective treatments:  None tried Associated symptoms: abdominal pain, nausea and vomiting   Associated symptoms: no chest pain     Past Medical History  Diagnosis Date  . Diabetes mellitus     lantus/novolog  . Tobacco abuse     5/day  . Marijuana abuse     occaisionally  . Hypertension   . Hyperlipidemia   . Noncompliance with medication regimen    Past Surgical History  Procedure Laterality Date  . Eye surgery     Family History  Problem Relation Age of Onset  . Diabetes Father   . Hypertension Mother   . Kidney failure Father     last 4 mnths of life   Social History  Substance Use Topics  . Smoking status: Current Every Day Smoker -- 1.00 packs/day for 8 years    Types: Cigarettes  . Smokeless tobacco: Never Used  . Alcohol Use: No    Review of Systems  Cardiovascular: Negative for chest pain.  Gastrointestinal: Positive for nausea, vomiting and abdominal pain.  All other systems reviewed and are negative.     Allergies  Orange  Home Medications   Prior to Admission medications   Medication Sig Start Date End Date Taking? Authorizing Provider  BYDUREON 2 MG PEN Inject 2 mg into the skin every 7 (seven) days. 12/11/15  Yes Historical Provider, MD  LEVEMIR FLEXTOUCH 100 UNIT/ML Pen INJECT [place]. Bid , Breakfast and supper. Patient  taking differently: Inject 60-70 Units into the skin 2 (two) times daily. 70units in the am and 60units at night 06/11/15  Yes [name], MD  lisinopril (PRINIVIL,ZESTRIL) 10 MG tablet Take 1 tablet (10 mg total) by mouth daily. 04/04/15  Yes [name], MD  naproxen (NAPROSYN) 500 MG tablet Take 500 mg by mouth 2 (two) times daily. 11/19/15  Yes Historical Provider, MD  pantoprazole (PROTONIX) 20 MG tablet Take 1 tablet (20 mg total) by mouth daily. 11/22/15  Yes Milton Ferguson, MD  pravastatin (PRAVACHOL) 40 MG tablet Take 1 tablet (40 mg total) by mouth daily at 6 PM. 06/10/15  Yes [name], MD  potassium chloride SA (K-DUR,KLOR-CON) 20 MEQ tablet Take 1 tablet (20 mEq total) by mouth daily. Patient not taking: Reported on 11/22/2015 06/11/15   [name], MD  traMADol (ULTRAM) 50 MG tablet Take 1 tablet (50 mg total) by mouth every 6 (six) hours as needed. Patient not taking: Reported on 01/04/2016 11/22/15   Milton Ferguson, MD   BP 200/105 mmHg  Pulse 104  Temp(Src) 98 F (36.7 C) (Oral)  Resp 16  Ht 6\' 1"  (1.854 m)  Wt 170 lb (77.111 kg)  BMI 22.43 kg/m2  SpO2 100% Physical Exam  Constitutional: He is oriented to person, place, and time. He appears well-developed and well-nourished.  HENT:  Head: Normocephalic and atraumatic.  Mouth/Throat: Mucous membranes are dry.  Neck: Normal range of motion.  Cardiovascular: Tachycardia  present.   Pulmonary/Chest: Effort normal. No respiratory distress.  Abdominal: Soft. He exhibits no distension. There is no tenderness.  Musculoskeletal: Normal range of motion. He exhibits no edema or tenderness.  Neurological: He is alert and oriented to person, place, and time. No cranial nerve deficit. Coordination normal.  Skin: Skin is warm and dry.  Nursing note and vitals reviewed.   ED Course  Procedures (including critical care time)  CRITICAL CARE Performed by: Merrily Pew  Total critical care time: 35 minutes Critical care  time was exclusive of separately billable procedures and treating other patients. Critical care was necessary to treat or prevent imminent or life-threatening deterioration. Critical care was time spent personally by me on the following activities: development of treatment plan with patient and/or surrogate as well as nursing, discussions with consultants, evaluation of patient's response to treatment, examination of patient, obtaining history from patient or surrogate, ordering and performing treatments and interventions, ordering and review of laboratory studies, ordering and review of radiographic studies, pulse oximetry and re-evaluation of patient's condition.   Labs Review Labs Reviewed  CBC WITH DIFFERENTIAL/PLATELET - Abnormal; Notable for the following:    WBC 11.0 (*)    Neutro Abs 9.5 (*)    All other components within normal limits  COMPREHENSIVE METABOLIC PANEL - Abnormal; Notable for the following:    Sodium 132 (*)    Chloride 95 (*)    CO2 20 (*)    Glucose, Bld 688 (*)    BUN 23 (*)    Creatinine, Ser 1.57 (*)    Total Bilirubin 1.5 (*)    GFR calc non Af Amer 58 (*)    Anion gap 17 (*)    All other components within normal limits  URINALYSIS, ROUTINE W REFLEX MICROSCOPIC (NOT AT [facility]) - Abnormal; Notable for the following:    Glucose, UA >1000 (*)    Hgb urine dipstick MODERATE (*)    Ketones, ur >80 (*)    Protein, ur 100 (*)    All other components within normal limits  BLOOD GAS, VENOUS - Abnormal; Notable for the following:    pH, Ven 7.338 (*)    pCO2, Ven 37.6 (*)    pO2, Ven 50.9 (*)    Bicarbonate 19.9 (*)    Acid-base deficit 5.1 (*)    All other components within normal limits  URINE MICROSCOPIC-ADD ON - Abnormal; Notable for the following:    Squamous Epithelial / LPF 0-5 (*)    All other components within normal limits  CBG MONITORING, ED - Abnormal; Notable for the following:    Glucose-Capillary >600 (*)    All other components within normal  limits  LIPASE, BLOOD  CBG MONITORING, ED    Imaging Review Dg Chest 2 View  01/04/2016  CLINICAL DATA:  Abdominal pain.  Nausea and vomiting. EXAM: CHEST - 2 VIEW COMPARISON:  Acute abdominal series 11/22/2015. CT of the chest 01/09/2014. FINDINGS: The heart size and mediastinal contours are within normal limits. Both lungs are clear. The visualized skeletal structures are unremarkable. IMPRESSION: Negative two view chest x-ray Electronically Signed   By: San Morelle M.D.   On: 01/04/2016 20:48   I have personally reviewed and evaluated these images and lab results as part of my medical decision-making.   EKG Interpretation   Date/Time:  Sunday January 04 2016 20:26:53 EDT Ventricular Rate:  103 PR Interval:  141 QRS Duration: 86 QT Interval:  324 QTC Calculation: 424 R Axis:   70  Text Interpretation:  Sinus tachycardia similar repolarization pattern to  april 2015 Confirmed by [facility] MD, Corene Cornea [phone]) on 01/04/2016 8:48:05 PM      MDM   Final diagnoses:  Diabetic ketoacidosis without coma associated with type 1 diabetes mellitus ([place])  Dehydration  Tachycardia   DKA 2/2 noncompliance (ran out of insulin, no pharmacies open today). ecg ok. No obvious infections. Tachycardic likely 2/2 dehydration as well. Insulin drip started. Will admit to stepdown.    Merrily Pew, MD 01/04/16 2159

## 2016-01-05 DIAGNOSIS — Z9114 Patient's other noncompliance with medication regimen: Secondary | ICD-10-CM | POA: Diagnosis not present

## 2016-01-05 DIAGNOSIS — Z833 Family history of diabetes mellitus: Secondary | ICD-10-CM | POA: Diagnosis not present

## 2016-01-05 DIAGNOSIS — I10 Essential (primary) hypertension: Secondary | ICD-10-CM | POA: Diagnosis present

## 2016-01-05 DIAGNOSIS — E1043 Type 1 diabetes mellitus with diabetic autonomic (poly)neuropathy: Secondary | ICD-10-CM | POA: Diagnosis present

## 2016-01-05 DIAGNOSIS — K3184 Gastroparesis: Secondary | ICD-10-CM | POA: Diagnosis present

## 2016-01-05 DIAGNOSIS — Z794 Long term (current) use of insulin: Secondary | ICD-10-CM | POA: Diagnosis not present

## 2016-01-05 DIAGNOSIS — N179 Acute kidney failure, unspecified: Secondary | ICD-10-CM | POA: Diagnosis present

## 2016-01-05 DIAGNOSIS — R739 Hyperglycemia, unspecified: Secondary | ICD-10-CM | POA: Diagnosis present

## 2016-01-05 DIAGNOSIS — E785 Hyperlipidemia, unspecified: Secondary | ICD-10-CM | POA: Diagnosis present

## 2016-01-05 DIAGNOSIS — F17210 Nicotine dependence, cigarettes, uncomplicated: Secondary | ICD-10-CM | POA: Diagnosis present

## 2016-01-05 DIAGNOSIS — E1010 Type 1 diabetes mellitus with ketoacidosis without coma: Secondary | ICD-10-CM | POA: Diagnosis present

## 2016-01-05 DIAGNOSIS — E860 Dehydration: Secondary | ICD-10-CM | POA: Diagnosis present

## 2016-01-05 LAB — GLUCOSE, CAPILLARY
GLUCOSE-CAPILLARY: 114 mg/dL — AB (ref 65–99)
GLUCOSE-CAPILLARY: 43 mg/dL — AB (ref 65–99)
GLUCOSE-CAPILLARY: 49 mg/dL — AB (ref 65–99)
GLUCOSE-CAPILLARY: 50 mg/dL — AB (ref 65–99)
GLUCOSE-CAPILLARY: 60 mg/dL — AB (ref 65–99)
GLUCOSE-CAPILLARY: 67 mg/dL (ref 65–99)
GLUCOSE-CAPILLARY: 70 mg/dL (ref 65–99)
GLUCOSE-CAPILLARY: 92 mg/dL (ref 65–99)
GLUCOSE-CAPILLARY: 96 mg/dL (ref 65–99)
Glucose-Capillary: 131 mg/dL — ABNORMAL HIGH (ref 65–99)
Glucose-Capillary: 140 mg/dL — ABNORMAL HIGH (ref 65–99)
Glucose-Capillary: 25 mg/dL — CL (ref 65–99)
Glucose-Capillary: 35 mg/dL — CL (ref 65–99)
Glucose-Capillary: 36 mg/dL — CL (ref 65–99)
Glucose-Capillary: 39 mg/dL — CL (ref 65–99)
Glucose-Capillary: 47 mg/dL — ABNORMAL LOW (ref 65–99)
Glucose-Capillary: 50 mg/dL — ABNORMAL LOW (ref 65–99)
Glucose-Capillary: 77 mg/dL (ref 65–99)

## 2016-01-05 LAB — BASIC METABOLIC PANEL
ANION GAP: 5 (ref 5–15)
Anion gap: 11 (ref 5–15)
Anion gap: 6 (ref 5–15)
Anion gap: 7 (ref 5–15)
Anion gap: 8 (ref 5–15)
Anion gap: 9 (ref 5–15)
BUN: 10 mg/dL (ref 6–20)
BUN: 12 mg/dL (ref 6–20)
BUN: 14 mg/dL (ref 6–20)
BUN: 15 mg/dL (ref 6–20)
BUN: 17 mg/dL (ref 6–20)
BUN: 8 mg/dL (ref 6–20)
CHLORIDE: 106 mmol/L (ref 101–111)
CHLORIDE: 106 mmol/L (ref 101–111)
CHLORIDE: 106 mmol/L (ref 101–111)
CHLORIDE: 108 mmol/L (ref 101–111)
CO2: 23 mmol/L (ref 22–32)
CO2: 24 mmol/L (ref 22–32)
CO2: 24 mmol/L (ref 22–32)
CO2: 25 mmol/L (ref 22–32)
CO2: 25 mmol/L (ref 22–32)
CO2: 26 mmol/L (ref 22–32)
CREATININE: 0.98 mg/dL (ref 0.61–1.24)
CREATININE: 1.04 mg/dL (ref 0.61–1.24)
CREATININE: 1.11 mg/dL (ref 0.61–1.24)
CREATININE: 1.16 mg/dL (ref 0.61–1.24)
Calcium: 8.1 mg/dL — ABNORMAL LOW (ref 8.9–10.3)
Calcium: 8.2 mg/dL — ABNORMAL LOW (ref 8.9–10.3)
Calcium: 8.2 mg/dL — ABNORMAL LOW (ref 8.9–10.3)
Calcium: 8.3 mg/dL — ABNORMAL LOW (ref 8.9–10.3)
Calcium: 8.5 mg/dL — ABNORMAL LOW (ref 8.9–10.3)
Calcium: 8.6 mg/dL — ABNORMAL LOW (ref 8.9–10.3)
Chloride: 101 mmol/L (ref 101–111)
Chloride: 105 mmol/L (ref 101–111)
Creatinine, Ser: 0.99 mg/dL (ref 0.61–1.24)
Creatinine, Ser: 1.09 mg/dL (ref 0.61–1.24)
GFR calc Af Amer: >60 mL/min (ref >60)
GFR calc Af Amer: >60 mL/min (ref >60)
GFR calc Af Amer: >60 mL/min (ref >60)
GFR calc Af Amer: >60 mL/min (ref >60)
GFR calc non Af Amer: >60 mL/min (ref >60)
GLUCOSE: 54 mg/dL — AB (ref 65–99)
GLUCOSE: 78 mg/dL (ref 65–99)
Glucose, Bld: 126 mg/dL — ABNORMAL HIGH (ref 65–99)
Glucose, Bld: 154 mg/dL — ABNORMAL HIGH (ref 65–99)
Glucose, Bld: 57 mg/dL — ABNORMAL LOW (ref 65–99)
Glucose, Bld: 64 mg/dL — ABNORMAL LOW (ref 65–99)
POTASSIUM: 3.3 mmol/L — AB (ref 3.5–5.1)
Potassium: 3.3 mmol/L — ABNORMAL LOW (ref 3.5–5.1)
Potassium: 3.5 mmol/L (ref 3.5–5.1)
Potassium: 3.8 mmol/L (ref 3.5–5.1)
Potassium: 3.9 mmol/L (ref 3.5–5.1)
Potassium: 4.1 mmol/L (ref 3.5–5.1)
SODIUM: 135 mmol/L (ref 135–145)
SODIUM: 137 mmol/L (ref 135–145)
SODIUM: 138 mmol/L (ref 135–145)
SODIUM: 138 mmol/L (ref 135–145)
SODIUM: 138 mmol/L (ref 135–145)
SODIUM: 139 mmol/L (ref 135–145)

## 2016-01-05 LAB — MAGNESIUM: Magnesium: 1.8 mg/dL (ref 1.7–2.4)

## 2016-01-05 LAB — BETA-HYDROXYBUTYRIC ACID: BETA-HYDROXYBUTYRIC ACID: 3.08 mmol/L — AB (ref 0.05–0.27)

## 2016-01-05 LAB — MRSA PCR SCREENING: MRSA BY PCR: NEGATIVE

## 2016-01-05 LAB — TROPONIN I
TROPONIN I: 0.06 ng/mL — AB (ref <0.031)
TROPONIN I: 0.06 ng/mL — AB (ref <0.031)
Troponin I: 0.06 ng/mL — ABNORMAL HIGH (ref <0.031)

## 2016-01-05 LAB — PHOSPHORUS: Phosphorus: 2.1 mg/dL — ABNORMAL LOW (ref 2.5–4.6)

## 2016-01-05 MED ORDER — INSULIN GLARGINE 100 UNIT/ML ~~LOC~~ SOLN
60.0000 [IU] | Freq: Every day | SUBCUTANEOUS | Status: DC
  Filled 2016-01-05 (×2): qty 0.6

## 2016-01-05 MED ORDER — INSULIN ASPART 100 UNIT/ML ~~LOC~~ SOLN: 0.0000 [IU] | Freq: Every day | SUBCUTANEOUS | Status: DC

## 2016-01-05 MED ORDER — INSULIN GLARGINE 100 UNIT/ML ~~LOC~~ SOLN
70.0000 [IU] | Freq: Every day | SUBCUTANEOUS | Status: DC
  Administered 2016-01-05: 70 [IU] via SUBCUTANEOUS
  Filled 2016-01-05 (×2): qty 0.7

## 2016-01-05 MED ORDER — INSULIN GLARGINE 100 UNIT/ML ~~LOC~~ SOLN
25.0000 [IU] | Freq: Every day | SUBCUTANEOUS | Status: DC
  Administered 2016-01-05: 25 [IU] via SUBCUTANEOUS
  Filled 2016-01-05 (×3): qty 0.25

## 2016-01-05 MED ORDER — INSULIN GLARGINE 100 UNIT/ML ~~LOC~~ SOLN
30.0000 [IU] | Freq: Every day | SUBCUTANEOUS | Status: DC
  Filled 2016-01-05: qty 0.3

## 2016-01-05 MED ORDER — DEXTROSE 50 % IV SOLN
1.0000 | Freq: Once | INTRAVENOUS | Status: AC
  Administered 2016-01-05: 50 mL via INTRAVENOUS

## 2016-01-05 MED ORDER — INSULIN GLARGINE 100 UNIT/ML ~~LOC~~ SOLN
40.0000 [IU] | Freq: Every day | SUBCUTANEOUS | Status: DC
  Filled 2016-01-05: qty 0.4

## 2016-01-05 MED ORDER — PANTOPRAZOLE SODIUM 40 MG PO TBEC
40.0000 mg | DELAYED_RELEASE_TABLET | Freq: Every day | ORAL | Status: DC
  Administered 2016-01-05 – 2016-01-06 (×2): 40 mg via ORAL
  Filled 2016-01-05 (×2): qty 1

## 2016-01-05 MED ORDER — DEXTROSE 50 % IV SOLN
INTRAVENOUS | Status: AC
  Administered 2016-01-05: 50 mL
  Filled 2016-01-05: qty 50

## 2016-01-05 MED ORDER — DEXTROSE 50 % IV SOLN
INTRAVENOUS | Status: AC
  Filled 2016-01-05: qty 50

## 2016-01-05 MED ORDER — INSULIN ASPART 100 UNIT/ML ~~LOC~~ SOLN: 0.0000 [IU] | Freq: Three times a day (TID) | SUBCUTANEOUS | Status: DC

## 2016-01-05 MED ORDER — INSULIN GLARGINE 100 UNIT/ML ~~LOC~~ SOLN
25.0000 [IU] | Freq: Every day | SUBCUTANEOUS | Status: DC
  Filled 2016-01-05 (×2): qty 0.25

## 2016-01-05 MED ORDER — DEXTROSE-NACL 5-0.45 % IV SOLN
INTRAVENOUS | Status: DC
  Administered 2016-01-05 – 2016-01-06 (×4): via INTRAVENOUS

## 2016-01-05 NOTE — Progress Notes (Signed)
Abdominal pain has resolved totally patient on carb modified diet with some low blood sugars have reduced his Lantus from 70 a.m. and 60 at bedtime to 20 5 AM and 40 at bedtime he is eating well he'll likewise has sliding scale coverage check be met in a.m. if failing clinically good glycemic control consider early discharge JAHKARI MACLIN [id number] DOB: [birth year] DOA: 01/04/2016 PCP: Maricela Curet, MD             Physical Exam: Blood pressure 108/67, pulse 94, temperature 98.1 F (36.7 C), temperature source Oral, resp. rate 16, height _0  (1.854 m), weight 147 lb 11.3 oz (67 kg), SpO2 100 %. lungs clear to A&P no rales wheeze rhonchi heart regular rhythm no S3-S4 no heaves thrills rubs abdomen soft nontender bowel sounds normoactive   Investigations:  No results found for this or any previous visit (from the past 240 hour(s)).   Basic Metabolic Panel:  Recent Labs  01/05/16 0009 01/05/16 0337 01/05/16 0715  NA 135 138 138  K 3.9 4.1 3.8  CL 101 105 106  CO2 _1 GLUCOSE 154* 126* 54*  BUN _2 CREATININE 1.16 1.11 1.04  CALCIUM 8.5* 8.6* 8.2*  MG 1.8  --   --   PHOS 2.1*  --   --    Liver Function Tests:  Recent Labs  01/04/16 2015  AST 22  ALT 24  ALKPHOS 82  BILITOT 1.5*  PROT 8.1  ALBUMIN 4.2     CBC:  Recent Labs  01/04/16 2015  WBC 11.0*  NEUTROABS 9.5*  HGB 13.0  HCT 39.5  MCV 89.4  PLT 252    Dg Chest 2 View  01/04/2016  CLINICAL DATA:  Abdominal pain.  Nausea and vomiting. EXAM: CHEST - 2 VIEW COMPARISON:  Acute abdominal series 11/22/2015. CT of the chest 01/09/2014. FINDINGS: The heart size and mediastinal contours are within normal limits. Both lungs are clear. The visualized skeletal structures are unremarkable. IMPRESSION: Negative two view chest x-ray Electronically Signed   By: San Morelle M.D.   On: 01/04/2016 20:48      Medications:   Impression:  Principal Problem:   DKA (diabetic  ketoacidoses) ([facility]) Active Problems:   Nausea and vomiting   Essential hypertension, benign   Gastroparesis   AKI (acute kidney injury) ([place])   Severe uncontrolled hypertension   Noncompliance with medications     Plan: Decrease Lantus to 25 units a.m. and 40 units at bedtime monitor glycemic control monitor abdominal pain may transfer to regular floor   Consultants:    Procedures   Antibiotics:                   Code Status: Full   Family Communication:    Disposition Plan see plan above  Time spent: 30 minutes     Tsuruko Murtha M   01/05/2016, 10:57 AM

## 2016-01-05 NOTE — Progress Notes (Signed)
Patients blood sugar continues to be low, still asymptomatic.  Orders to increase fluids to 175cc/hr and to continue encouraging PO intake.  Orders to resume with transfer to med surg bed.

## 2016-01-05 NOTE — Progress Notes (Addendum)
Inpatient Diabetes Program Recommendations  AACE/ADA: New Consensus Statement on Inpatient Glycemic Control (2015)  Target Ranges:  Prepandial:   less than 140 mg/dL      Peak postprandial:   less than 180 mg/dL (1-2 hours)      Critically ill patients:  140 - 180 mg/dL  Results for Nathaniel [name], Nathaniel [name] (MRN [id number]) as of 01/05/2016 08:21  Ref. Range 01/04/2016 19:45 01/04/2016 21:33 01/04/2016 22:33 01/04/2016 23:35 01/05/2016 00:38 01/05/2016 01:42 01/05/2016 07:17 01/05/2016 08:10  Glucose-Capillary Latest Ref Range: 65-99 mg/dL >600 (HH) 455 (H) 214 (H) 131 (H) 114 (H) 140 (H) 39 (LL) 35 (LL)   Review of Glycemic Control  Diabetes history: DM1 Outpatient Diabetes medications: Levemir 70 units QAM, Levemir 60 units QPM, Bydureon 2 mg Q week Current orders for Inpatient glycemic control: Lantus 60 units daily, Lantus 70 units QHS, Novolog 0-9 units TID with meals, Novolog 0-5 units QHS  Inpatient Diabetes Program Recommendations: Insulin - Basal: Patient has Type 1 diabetes (according to H&P) and had an initial glucose of 688 mg/dl on 01/04/16 and patient has been transitioned off IV insulin to SQ insulin. Patient recieved Lantus 70 units at 1:43 am on 01/05/16 and glucose down to 35 mg/dl at 8:10 today. Please discontinue current Lantus orders and order Levemir (basal used as outpatient) 23 units daily (based on 67 kg x 0.35).  A1C: In process.  Addendum 01/05/16@15 :10-Spoke with patient about diabetes and home regimen for diabetes control. Patient reports that he is followed by PCP for diabetes management and currently he takes Levemir 60-70 units QAM (around 12 noon) and Levemir 60 units QHS (around 1-2 am) and Bydureon 2 mg Qweek as an outpatient for diabetes control. Patient reports that he was dx with DM age the age of 30 or 30 years old and that he was recently started on the Bydureon about 1 week ago. Inquired about rapid acting insulin and patient states that he use to take Novolog but does  not take it anymore. Patient reports that he will sometime adjust the Levemir dose up or down depending on his activity and meal intake. Patient reports that he normally sleeps to 11:00-12:00 noon and goes to bed in the early morning hours (around 1:00-2:00 am). Patient reports that he normally eats 1-2 meals per day and snacks throughout the day.  Patient is not eating or drinking much today per nurse. Inquired about why patient is not wanting to eat and he states that he does not have much of an appetite right now.  Patient states that he checks his glucose once every couple of days. Patient recently got a new glucometer about 3 days ago and has only checked his glucose 1 time since getting his new glucometer. Patient reports that when he did check his glucose with the new glucometer it was "70 or 80 something". Inquired about prior A1C and patient reports that he does not recall his last A1C value. Current A1C has been drawn is and still pending at this time.  Discussed glucose and A1C goals. Discussed importance of checking CBGs and maintaining good CBG control to prevent long-term and short-term complications. Explained how hyperglycemia leads to damage within blood vessels which lead to the common complications seen with uncontrolled diabetes. Patient has experienced several episodes of hypoglycemia since receiving Lantus 70 units at 1:43 am today and Lantus 25 units at 10:00 today. Patient states that he does not feel symptomatic with hypoglycemia he has experienced today; not even when his glucose was  25 mg/dl at 13:05. Discussed the danger of frequent hypoglycemia especially with hypoglycemia unawareness.   Stressed to the patient the importance of improving glycemic control to prevent further complications from uncontrolled diabetes. Discussed impact of nutrition, exercise, stress, sickness, and medications on diabetes control. Inquired about potentially seeing an Endocrinologist and patient reports that  he use to see Dr. Dwyane Dee but now only sees Dr. [name] for diabetes management. Patient states that he is not interested in seeing an Endocrinologist at this time because "they fuss about my diabetes too much." Explained that he needs to be monitoring and controlling his diabetes better to prevent complications from uncontrolled diabetes. Encouraged patient to check his glucose at least 2 times per day and to keep a log book of glucose readings and insulin taken which he will need to take to doctor appointments. Explained how the doctor can use the log book to continue to make insulin adjustments if needed. Patient verbalized understanding of information discussed and he states that he has no further questions at this time related to diabetes.  Concerned that patient is using basal insulin for all insulin needs which can lead to hypoglycemia when patient is not eating well. Patient reported that he does not have an appetite and does not feel like eating right now. Encouraged patient to at least drink fluids with carbohydrates to help with treating hypoglycemia he is currently experiencing. Recommend not giving patient any additional basal insulin tonight and restart Levemir 23 units daily starting tomorrow morning if hypoglycemia has resolved.  Thanks, Barnie Alderman, RN, MSN, CDE Diabetes Coordinator Inpatient Diabetes Program [phone] (Team Pager from [place] to [place]) [phone] ([place] office) [phone] [facility] office) [phone] [facility] office)

## 2016-01-05 NOTE — Progress Notes (Signed)
50ML D50  GIVEN IV FOR CBG 43. SPOKE W/ DR Maudie Mercury ABOUT CONTINUES LOW CBG'S. PT WILL REMAIN HERE AS STEPDOWN SO WE CAN MONITOR GLUCOSE THROUGH THE NIGHT.

## 2016-01-05 NOTE — Progress Notes (Signed)
CBG 60. 1AMP/50ML OF D50 GIVEN IV PUSH PER HYPOGLYCEMIC PROTOCOL. PR STILL NOT EATING.HE IS ATTEMPTING TO DRINK SOME BREEZE WILD BERRY JUICE.

## 2016-01-05 NOTE — Progress Notes (Addendum)
Report called to Apolonio Schneiders, RN on 300.  Patient in NAD at this time.  Will transfer to 335 by WC after supper.  1755 - Blood sugar remains low, discussed with charge RN, will hold patient and have next shift transfer patient when sugar more stable.  Floor made aware.  Continuing to encourage PO intake.  Patient did consume few bites from dinner plate.

## 2016-01-05 NOTE — Plan of Care (Signed)
Problem: Respiratory: Goal: Peripheral tissue perfusion will improve Outcome: Not Progressing  Pt refuses to wear scd's

## 2016-01-05 NOTE — Plan of Care (Signed)
Problem: Nutritional: Goal: Maintenance of adequate nutrition will improve Outcome: Not Progressing Pt has very poor appetite. cbg's are running low. Pt encouragede strongly to eating and drink, but states his is just no able to.

## 2016-01-05 NOTE — Progress Notes (Signed)
Patients blood sugars consistently decreasing.  Patient refuses to eat - states "i just don't feel like eating."  Denies abd pain and nausea.  Sipping on liquids slowly.  Patient asymptomatic with low blood sugars.  MD made aware - orders for D5 1/2 NS started.  Blood sugar now up to 67.  Will continue to monitor and encourage PO intake. Lantus dosages have also been decreased per MD.

## 2016-01-05 NOTE — Progress Notes (Signed)
Hypoglycemic Event  CBG: 39  Treatment: 15 GM carbohydrate snack  Symptoms: None  Follow-up CBG: Time:0900 CBG Result:96  Possible Reasons for Event: Medication regimen: lantus  Comments/MD notified:MD made aware - lantus dose decreased for this AM    Marcell Anger, Marveen Reeks

## 2016-01-06 LAB — GLUCOSE, CAPILLARY
GLUCOSE-CAPILLARY: 108 mg/dL — AB (ref 65–99)
GLUCOSE-CAPILLARY: 72 mg/dL (ref 65–99)
Glucose-Capillary: 33 mg/dL — CL (ref 65–99)
Glucose-Capillary: 90 mg/dL (ref 65–99)
Glucose-Capillary: 67 mg/dL (ref 65–99)
Glucose-Capillary: 41 mg/dL — CL (ref 65–99)
Glucose-Capillary: 142 mg/dL — ABNORMAL HIGH (ref 65–99)
Glucose-Capillary: 94 mg/dL (ref 65–99)
Glucose-Capillary: 73 mg/dL (ref 65–99)

## 2016-01-06 LAB — BASIC METABOLIC PANEL
Anion gap: 8 (ref 5–15)
BUN: 6 mg/dL (ref 6–20)
CALCIUM: 8.3 mg/dL — AB (ref 8.9–10.3)
CO2: 23 mmol/L (ref 22–32)
CREATININE: 0.94 mg/dL (ref 0.61–1.24)
Chloride: 108 mmol/L (ref 101–111)
Glucose, Bld: 85 mg/dL (ref 65–99)
Potassium: 2.9 mmol/L — ABNORMAL LOW (ref 3.5–5.1)
SODIUM: 139 mmol/L (ref 135–145)

## 2016-01-06 LAB — HEMOGLOBIN A1C
Hgb A1c MFr Bld: 10.1 % — ABNORMAL HIGH (ref 4.8–5.6)
Mean Plasma Glucose: 243 mg/dL

## 2016-01-06 LAB — TROPONIN I
TROPONIN I: 0.05 ng/mL — AB (ref <0.031)
TROPONIN I: 0.05 ng/mL — AB (ref <0.031)

## 2016-01-06 MED ORDER — DEXTROSE 50 % IV SOLN
INTRAVENOUS | Status: AC
Start: 2016-01-06 — End: 2016-01-06
  Administered 2016-01-06: 50 mL
  Filled 2016-01-06: qty 50

## 2016-01-06 MED ORDER — POTASSIUM CHLORIDE CRYS ER 20 MEQ PO TBCR
40.0000 meq | EXTENDED_RELEASE_TABLET | Freq: Two times a day (BID) | ORAL | Status: DC
  Administered 2016-01-06: 40 meq via ORAL
  Filled 2016-01-06: qty 2

## 2016-01-06 MED ORDER — DEXTROSE 50 % IV SOLN
INTRAVENOUS | Status: AC
  Administered 2016-01-06: 50 mL
  Filled 2016-01-06: qty 50

## 2016-01-06 MED ORDER — POTASSIUM CHLORIDE 10 MEQ/100ML IV SOLN
10.0000 meq | INTRAVENOUS | Status: DC
  Administered 2016-01-06: 10 meq via INTRAVENOUS
  Filled 2016-01-06: qty 100

## 2016-01-06 NOTE — Progress Notes (Signed)
Called Dr. [name] and notified him of patients desire to leave against medical advice.

## 2016-01-06 NOTE — Progress Notes (Signed)
Patient signed out AMA (against medical advice). Did give potassium to replace potassium before he left. IV was removed without incident. Gatherer all belongings and with mother left hospital.

## 2016-01-06 NOTE — Progress Notes (Signed)
Inpatient Diabetes Program Recommendations  AACE/ADA: New Consensus Statement on Inpatient Glycemic Control (2015)  Target Ranges:  Prepandial:   less than 140 mg/dL      Peak postprandial:   less than 180 mg/dL (1-2 hours)      Critically ill patients:  140 - 180 mg/dL  Results for Nathaniel [name], Nathaniel [name] (MRN [id number]) as of 01/06/2016 07:40  Ref. Range 01/05/2016 12:09 01/05/2016 13:05 01/05/2016 14:29 01/05/2016 16:13 01/05/2016 17:21 01/05/2016 18:31 01/05/2016 19:31 01/05/2016 20:45 01/05/2016 21:37 01/05/2016 22:34 01/05/2016 23:32 01/06/2016 00:41 01/06/2016 04:31 01/06/2016 05:52 01/06/2016 06:53  Glucose-Capillary Latest Ref Range: 65-99 mg/dL 47 (L) 25 (LL) 67 49 (L) 36 (LL) 50 (L) 43 (LL) 92 77 70 60 (L) 72 90 67 41 (LL)   Review of Glycemic Control  Current orders for Inpatient glycemic control: Lantus 25 units daily, Lantus 25 units QHS, Novolog 0-9 units TID with meals, Novolog 0-5 units QHS  Inpatient Diabetes Program Recommendations: Insulin - Basal: Patient continues to have hypoglycemia and fasting glucose 41 mg/dl this morning. Please discontinue current Lantus orders and order Lantus 21 units QHS (based on 71 kg x 0.3 units). HgbA1C: A1C 10.1% on 01/05/16 indicating an average glucose of 243 mg/dl over the past 2-3 months.  Thanks, Barnie Alderman, RN, MSN, CDE Diabetes Coordinator Inpatient Diabetes Program [phone] (Team Pager from [place] to [place]) [phone] ([place] office) [phone] [facility] office) [phone] [facility] office)

## 2016-02-18 NOTE — Discharge Summary (Signed)
Physician Discharge Summary  Nathaniel [name] [id number] DOB: [birth year] DOA: 01/04/2016  PCP: Maricela Curet, MD  Admit date: 01/04/2016 Discharge date: 02/18/2016   Recommendations for Outpatient Follow-up:  Unfortunately this patient signed out [place] after being admitted with uncontrolled diabetes and diabetic ketoacidosis is uncontrolled diabetes mellitus due to not picking up his insulin from the drugstore patient will hopefully follow-up in the office on INSULIN ABOUT THE IMPORTANCE OF TAKING ALL OF HIS MEDICINES AS PRESCRIBED   Discharge Diagnoses:  Principal Problem:   DKA (diabetic ketoacidoses) ([place]) Active Problems:   Nausea and vomiting   Essential hypertension, benign   Gastroparesis   AKI (acute kidney injury) ([place])   Severe uncontrolled hypertension   Noncompliance with medications   Discharge Condition: Stable  Filed Weights   01/04/16 1939 01/04/16 2300 01/06/16 0500  Weight: 77.111 kg (170 lb) 67 kg (147 lb 11.3 oz) 71.3 kg (157 lb 3 oz)    History of present illness:  Patient is a known type 1 insulin-dependent diabetic (average insulin 2-3 days was admitted with hyperkeratotic non-osmolar state with glucose in the 600s with prolonged Glucomander protocol given intravenous fluid resuscitation monitoring his renal function and electrolytes after his glucose became somewhat normalized over several hours the patient signed out AMA prior to my seeing him in the morning he'll be followed from the office to told to follow-up shortly in as an outpatient  Hospital Course:  See history of present illness  Procedures:    Consultations:    Discharge Instructions     Medication List    ASK your doctor about these medications        BYDUREON 2 MG Pen  Generic drug:  Exenatide ER  Inject 2 mg into the skin every 7 (seven) days.     LEVEMIR FLEXTOUCH 100 UNIT/ML Pen  Generic drug:  Insulin Detemir  INJECT 30 UNITS SUBCUTNEOUSLY A.C. Bid ,  Breakfast and supper.     lisinopril 10 MG tablet  Commonly known as:  PRINIVIL,ZESTRIL  Take 1 tablet (10 mg total) by mouth daily.     naproxen 500 MG tablet  Commonly known as:  NAPROSYN  Take 500 mg by mouth 2 (two) times daily.     pantoprazole 20 MG tablet  Commonly known as:  PROTONIX  Take 1 tablet (20 mg total) by mouth daily.     potassium chloride SA 20 MEQ tablet  Commonly known as:  K-DUR,KLOR-CON  Take 1 tablet (20 mEq total) by mouth daily.     pravastatin 40 MG tablet  Commonly known as:  PRAVACHOL  Take 1 tablet (40 mg total) by mouth daily at 6 PM.     traMADol 50 MG tablet  Commonly known as:  ULTRAM  Take 1 tablet (50 mg total) by mouth every 6 (six) hours as needed.       Allergies  Allergen Reactions  . Orange     Increases blood sugar immediately       The results of significant diagnostics from this hospitalization (including imaging, microbiology, ancillary and laboratory) are listed below for reference.    Significant Diagnostic Studies: No results found.  Microbiology: No results found for this or any previous visit (from the past 240 hour(s)).   Labs: Basic Metabolic Panel: No results for input(s): NA, K, CL, CO2, GLUCOSE, BUN, CREATININE, CALCIUM, MG, PHOS in the last 168 hours. Liver Function Tests: No results for input(s): AST, ALT, ALKPHOS, BILITOT, PROT, ALBUMIN in the last 168  hours. No results for input(s): LIPASE, AMYLASE in the last 168 hours. No results for input(s): AMMONIA in the last 168 hours. CBC: No results for input(s): WBC, NEUTROABS, HGB, HCT, MCV, PLT in the last 168 hours. Cardiac Enzymes: No results for input(s): CKTOTAL, CKMB, CKMBINDEX, TROPONINI in the last 168 hours. BNP: BNP (last 3 results) No results for input(s): BNP in the last 8760 hours.  ProBNP (last 3 results) No results for input(s): PROBNP in the last 8760 hours.  CBG: No results for input(s): GLUCAP in the last 168  hours.     Signed:  Kellan Raffield Jerilynn Mages  [facility] Pager: [phone] 02/18/2016, 11:33 AM

## 2016-04-15 ENCOUNTER — Encounter (HOSPITAL_COMMUNITY): Payer: Self-pay | Admitting: Emergency Medicine

## 2016-04-15 ENCOUNTER — Emergency Department (HOSPITAL_COMMUNITY): Payer: Medicare Other

## 2016-04-15 ENCOUNTER — Inpatient Hospital Stay (HOSPITAL_COMMUNITY)
Admission: EM | Admit: 2016-04-15 | Discharge: 2016-04-17 | DRG: 683 | Disposition: A | Discharge status: Home / Self Care | Payer: Medicare Other | Attending: Family Medicine | Admitting: Family Medicine

## 2016-04-15 DIAGNOSIS — E871 Hypo-osmolality and hyponatremia: Secondary | ICD-10-CM | POA: Diagnosis present

## 2016-04-15 DIAGNOSIS — Z9114 Patient's other noncompliance with medication regimen: Secondary | ICD-10-CM

## 2016-04-15 DIAGNOSIS — R739 Hyperglycemia, unspecified: Secondary | ICD-10-CM | POA: Diagnosis not present

## 2016-04-15 DIAGNOSIS — K3184 Gastroparesis: Secondary | ICD-10-CM

## 2016-04-15 DIAGNOSIS — I10 Essential (primary) hypertension: Secondary | ICD-10-CM | POA: Diagnosis not present

## 2016-04-15 DIAGNOSIS — N179 Acute kidney failure, unspecified: Secondary | ICD-10-CM | POA: Diagnosis not present

## 2016-04-15 DIAGNOSIS — R112 Nausea with vomiting, unspecified: Secondary | ICD-10-CM | POA: Diagnosis present

## 2016-04-15 DIAGNOSIS — E1010 Type 1 diabetes mellitus with ketoacidosis without coma: Secondary | ICD-10-CM | POA: Insufficient documentation

## 2016-04-15 DIAGNOSIS — F1210 Cannabis abuse, uncomplicated: Secondary | ICD-10-CM

## 2016-04-15 DIAGNOSIS — D696 Thrombocytopenia, unspecified: Secondary | ICD-10-CM | POA: Diagnosis present

## 2016-04-15 DIAGNOSIS — Z833 Family history of diabetes mellitus: Secondary | ICD-10-CM

## 2016-04-15 DIAGNOSIS — Z8249 Family history of ischemic heart disease and other diseases of the circulatory system: Secondary | ICD-10-CM

## 2016-04-15 DIAGNOSIS — Z841 Family history of disorders of kidney and ureter: Secondary | ICD-10-CM

## 2016-04-15 DIAGNOSIS — E860 Dehydration: Secondary | ICD-10-CM | POA: Diagnosis present

## 2016-04-15 DIAGNOSIS — Z91148 Patient's other noncompliance with medication regimen for other reason: Secondary | ICD-10-CM

## 2016-04-15 DIAGNOSIS — Z794 Long term (current) use of insulin: Secondary | ICD-10-CM

## 2016-04-15 DIAGNOSIS — R1110 Vomiting, unspecified: Secondary | ICD-10-CM

## 2016-04-15 DIAGNOSIS — D649 Anemia, unspecified: Secondary | ICD-10-CM | POA: Diagnosis present

## 2016-04-15 DIAGNOSIS — E1065 Type 1 diabetes mellitus with hyperglycemia: Secondary | ICD-10-CM | POA: Diagnosis present

## 2016-04-15 DIAGNOSIS — E785 Hyperlipidemia, unspecified: Secondary | ICD-10-CM | POA: Diagnosis present

## 2016-04-15 DIAGNOSIS — F17210 Nicotine dependence, cigarettes, uncomplicated: Secondary | ICD-10-CM | POA: Diagnosis present

## 2016-04-15 LAB — BASIC METABOLIC PANEL
ANION GAP: 10 (ref 5–15)
ANION GAP: 9 (ref 5–15)
BUN: 22 mg/dL — ABNORMAL HIGH (ref 6–20)
BUN: 20 mg/dL (ref 6–20)
CALCIUM: 8.8 mg/dL — AB (ref 8.9–10.3)
CHLORIDE: 100 mmol/L — AB (ref 101–111)
CHLORIDE: 99 mmol/L — AB (ref 101–111)
CO2: 22 mmol/L (ref 22–32)
CO2: 25 mmol/L (ref 22–32)
Calcium: 8.5 mg/dL — ABNORMAL LOW (ref 8.9–10.3)
Creatinine, Ser: 1.37 mg/dL — ABNORMAL HIGH (ref 0.61–1.24)
Creatinine, Ser: 1.26 mg/dL — ABNORMAL HIGH (ref 0.61–1.24)
GFR calc non Af Amer: >60 mL/min (ref >60)
Glucose, Bld: 298 mg/dL — ABNORMAL HIGH (ref 65–99)
Glucose, Bld: 132 mg/dL — ABNORMAL HIGH (ref 65–99)
POTASSIUM: 4.1 mmol/L (ref 3.5–5.1)
Potassium: 3.9 mmol/L (ref 3.5–5.1)
SODIUM: 132 mmol/L — AB (ref 135–145)
Sodium: 133 mmol/L — ABNORMAL LOW (ref 135–145)

## 2016-04-15 LAB — COMPREHENSIVE METABOLIC PANEL
ALT: 18 U/L (ref 17–63)
ANION GAP: 14 (ref 5–15)
AST: 19 U/L (ref 15–41)
Albumin: 4 g/dL (ref 3.5–5.0)
Alkaline Phosphatase: 88 U/L (ref 38–126)
BUN: 23 mg/dL — ABNORMAL HIGH (ref 6–20)
CHLORIDE: 91 mmol/L — AB (ref 101–111)
CO2: 23 mmol/L (ref 22–32)
CREATININE: 1.42 mg/dL — AB (ref 0.61–1.24)
Calcium: 9.1 mg/dL (ref 8.9–10.3)
Glucose, Bld: 558 mg/dL (ref 65–99)
Potassium: 4.8 mmol/L (ref 3.5–5.1)
Sodium: 128 mmol/L — ABNORMAL LOW (ref 135–145)
Total Bilirubin: 1.4 mg/dL — ABNORMAL HIGH (ref 0.3–1.2)
Total Protein: 8.1 g/dL (ref 6.5–8.1)

## 2016-04-15 LAB — IRON AND TIBC
IRON: 56 ug/dL (ref 45–182)
Saturation Ratios: 23 % (ref 17.9–39.5)
TIBC: 246 ug/dL — AB (ref 250–450)
UIBC: 190 ug/dL

## 2016-04-15 LAB — CBG MONITORING, ED
GLUCOSE-CAPILLARY: 556 mg/dL — AB (ref 65–99)
GLUCOSE-CAPILLARY: 564 mg/dL — AB (ref 65–99)
Glucose-Capillary: 290 mg/dL — ABNORMAL HIGH (ref 65–99)

## 2016-04-15 LAB — BLOOD GAS, VENOUS
Acid-base deficit: 2 mmol/L (ref 0.0–2.0)
Bicarbonate: 23 mEq/L (ref 20.0–24.0)
FIO2: 0.21
O2 SAT: 97.6 %
PATIENT TEMPERATURE: 98.6
pCO2, Ven: 35.5 mmHg — ABNORMAL LOW (ref 45.0–50.0)
pH, Ven: 7.409 — ABNORMAL HIGH (ref 7.250–7.300)
pO2, Ven: 114 mmHg — ABNORMAL HIGH (ref 31.0–45.0)

## 2016-04-15 LAB — URINALYSIS, ROUTINE W REFLEX MICROSCOPIC
Bilirubin Urine: NEGATIVE
Ketones, ur: 15 mg/dL — AB
LEUKOCYTES UA: NEGATIVE
Nitrite: NEGATIVE
PROTEIN: 100 mg/dL — AB
SPECIFIC GRAVITY, URINE: 1.02 (ref 1.005–1.030)
pH: 5.5 (ref 5.0–8.0)

## 2016-04-15 LAB — CBC
HCT: 34.9 % — ABNORMAL LOW (ref 39.0–52.0)
HEMOGLOBIN: 11.9 g/dL — AB (ref 13.0–17.0)
MCH: 30 pg (ref 26.0–34.0)
MCHC: 34.1 g/dL (ref 30.0–36.0)
MCV: 87.9 fL (ref 78.0–100.0)
PLATELETS: 129 10*3/uL — AB (ref 150–400)
RBC: 3.97 MIL/uL — AB (ref 4.22–5.81)
RDW: 13.7 % (ref 11.5–15.5)
WBC: 9.8 10*3/uL (ref 4.0–10.5)

## 2016-04-15 LAB — URINE MICROSCOPIC-ADD ON

## 2016-04-15 LAB — GLUCOSE, CAPILLARY
Glucose-Capillary: 407 mg/dL — ABNORMAL HIGH (ref 65–99)
Glucose-Capillary: 171 mg/dL — ABNORMAL HIGH (ref 65–99)

## 2016-04-15 LAB — LIPASE, BLOOD: LIPASE: 22 U/L (ref 11–51)

## 2016-04-15 LAB — VITAMIN B12: VITAMIN B 12: 479 pg/mL (ref 180–914)

## 2016-04-15 LAB — MAGNESIUM: Magnesium: 1.7 mg/dL (ref 1.7–2.4)

## 2016-04-15 MED ORDER — ACETAMINOPHEN 325 MG PO TABS: 650.0000 mg | ORAL_TABLET | Freq: Four times a day (QID) | ORAL | Status: DC | PRN

## 2016-04-15 MED ORDER — ONDANSETRON HCL 4 MG/2ML IJ SOLN
4.0000 mg | Freq: Four times a day (QID) | INTRAMUSCULAR | Status: DC | PRN
  Administered 2016-04-15 – 2016-04-16 (×2): 4 mg via INTRAVENOUS
  Filled 2016-04-15 (×2): qty 2

## 2016-04-15 MED ORDER — SODIUM CHLORIDE 0.9 % IV BOLUS (SEPSIS)
1000.0000 mL | Freq: Once | INTRAVENOUS | Status: AC
  Administered 2016-04-15: 1000 mL via INTRAVENOUS

## 2016-04-15 MED ORDER — ONDANSETRON HCL 4 MG PO TABS: 4.0000 mg | ORAL_TABLET | Freq: Four times a day (QID) | ORAL | Status: DC | PRN

## 2016-04-15 MED ORDER — ACETAMINOPHEN 650 MG RE SUPP: 650.0000 mg | Freq: Four times a day (QID) | RECTAL | Status: DC | PRN

## 2016-04-15 MED ORDER — PRAVASTATIN SODIUM 40 MG PO TABS
40.0000 mg | ORAL_TABLET | Freq: Every day | ORAL | Status: DC
  Administered 2016-04-15 – 2016-04-16 (×2): 40 mg via ORAL
  Filled 2016-04-15 (×3): qty 1

## 2016-04-15 MED ORDER — HYDRALAZINE HCL 20 MG/ML IJ SOLN
10.0000 mg | INTRAMUSCULAR | Status: DC | PRN
  Administered 2016-04-15 – 2016-04-16 (×3): 10 mg via INTRAVENOUS
  Filled 2016-04-15 (×3): qty 1

## 2016-04-15 MED ORDER — INSULIN ASPART 100 UNIT/ML ~~LOC~~ SOLN: 0.0000 [IU] | Freq: Three times a day (TID) | SUBCUTANEOUS | Status: DC

## 2016-04-15 MED ORDER — INSULIN DETEMIR 100 UNIT/ML ~~LOC~~ SOLN
50.0000 [IU] | Freq: Two times a day (BID) | SUBCUTANEOUS | Status: DC
  Administered 2016-04-15: 50 [IU] via SUBCUTANEOUS
  Filled 2016-04-15 (×2): qty 0.5

## 2016-04-15 MED ORDER — SODIUM CHLORIDE 0.9 % IV SOLN
Freq: Once | INTRAVENOUS | Status: AC
  Administered 2016-04-15: 15:00:00 via INTRAVENOUS

## 2016-04-15 MED ORDER — ONDANSETRON HCL 4 MG/2ML IJ SOLN
4.0000 mg | Freq: Once | INTRAMUSCULAR | Status: AC
  Administered 2016-04-15: 4 mg via INTRAVENOUS
  Filled 2016-04-15: qty 2

## 2016-04-15 MED ORDER — SODIUM CHLORIDE 0.9 % IV SOLN
INTRAVENOUS | Status: AC
  Administered 2016-04-15 (×2): via INTRAVENOUS

## 2016-04-15 MED ORDER — INSULIN ASPART 100 UNIT/ML ~~LOC~~ SOLN
4.0000 [IU] | Freq: Three times a day (TID) | SUBCUTANEOUS | Status: DC
  Administered 2016-04-16: 4 [IU] via SUBCUTANEOUS

## 2016-04-15 MED ORDER — HYDROCODONE-ACETAMINOPHEN 5-325 MG PO TABS
1.0000 | ORAL_TABLET | ORAL | Status: DC | PRN
  Administered 2016-04-16: 1 via ORAL
  Filled 2016-04-15: qty 1

## 2016-04-15 MED ORDER — SODIUM CHLORIDE 0.9% FLUSH: 3.0000 mL | Freq: Two times a day (BID) | INTRAVENOUS | Status: DC

## 2016-04-15 MED ORDER — INSULIN ASPART 100 UNIT/ML ~~LOC~~ SOLN
15.0000 [IU] | Freq: Once | SUBCUTANEOUS | Status: AC
  Administered 2016-04-15: 15 [IU] via SUBCUTANEOUS

## 2016-04-15 MED ORDER — INSULIN ASPART 100 UNIT/ML ~~LOC~~ SOLN
8.0000 [IU] | Freq: Once | SUBCUTANEOUS | Status: AC
  Administered 2016-04-15: 8 [IU] via SUBCUTANEOUS
  Filled 2016-04-15: qty 1

## 2016-04-15 MED ORDER — PANTOPRAZOLE SODIUM 40 MG PO TBEC
40.0000 mg | DELAYED_RELEASE_TABLET | Freq: Every day | ORAL | Status: DC
  Administered 2016-04-15 – 2016-04-17 (×3): 40 mg via ORAL
  Filled 2016-04-15 (×3): qty 1

## 2016-04-15 MED ORDER — ENOXAPARIN SODIUM 40 MG/0.4ML ~~LOC~~ SOLN
40.0000 mg | SUBCUTANEOUS | Status: DC
  Administered 2016-04-15 – 2016-04-16 (×2): 40 mg via SUBCUTANEOUS
  Filled 2016-04-15 (×2): qty 0.4

## 2016-04-15 MED ORDER — BOOST / RESOURCE BREEZE PO LIQD
1.0000 | Freq: Three times a day (TID) | ORAL | Status: DC
  Administered 2016-04-15: 1 via ORAL

## 2016-04-15 NOTE — ED Notes (Signed)
Advised patient we needed a urine specimen 

## 2016-04-15 NOTE — ED Provider Notes (Signed)
[facility] DEPT Provider Note   CSN: [id number] Arrival date & time: 04/15/16  1029  First Provider Contact:  None    By signing my name below, I, Hansel Feinstein, attest that this documentation has been prepared under the direction and in the presence of Duffy Bruce, MD. Electronically Signed: Hansel Feinstein, ED Scribe. 04/15/16. 11:39 AM.     History   Chief Complaint Chief Complaint  Patient presents with  . Emesis    HPI Nathaniel [name] is a 30 y.o. male with h/o DM1 who presents to the Emergency Department complaining of moderate, gradual onset epigastric abdominal pain onset last night with associated nausea, multiple episodes of emesis. Pt also reports mild diarrhea and frequency yesterday. Pt states he has not been able to tolerate food or fluids. Pt denies taking OTC medications at home to improve symptoms. No known sick contacts with similar symptoms, no suspicious food intake. Pt states his current symptoms feel slightly similar to prior episode of DKA. He denies fever, cough, rhinorrhea, fever. Pt is followed by Dr. [name].   The history is provided by the patient. No language interpreter was used.    Past Medical History:  Diagnosis Date  . Diabetes mellitus    lantus/novolog  . Hyperlipidemia   . Hypertension   . Marijuana abuse    occaisionally  . Noncompliance with medication regimen   . Tobacco abuse    5/day    Patient Active Problem List   Diagnosis Date Noted  . Hyponatremia 04/15/2016  . Type 1 diabetes mellitus with hyperosmolarity without nonketotic hyperglycemic hyperosmolar coma ([place]) 04/15/2016  . Hyperglycemia 04/15/2016  . AKI (acute kidney injury) ([place]) 01/04/2016  . Severe uncontrolled hypertension 01/04/2016  . Noncompliance with medications 01/04/2016  . DKA (diabetic ketoacidoses) ([place]) 06/07/2015  . Intractable nausea and vomiting 04/01/2015  . Gastroparesis 04/01/2015  . Type 1 diabetes mellitus with complication ([place])  [id number]  . Chronic hypertension   . Nausea with vomiting   . Abscess, gluteal, right 06/21/2014  . Nausea and vomiting 04/08/2013  . Hyperglycemia without ketosis 04/08/2013  . Essential hypertension, benign 04/08/2013  . Hypercalcemia 07/29/2011  . Vomiting 07/28/2011  . Leukocytosis 07/28/2011  . Hypokalemia 07/28/2011  . DKA, type 1 ([place]) 07/27/2011  . Dehydration 07/27/2011  . Tobacco abuse 07/27/2011  . Marijuana abuse 07/27/2011    Past Surgical History:  Procedure Laterality Date  . EYE SURGERY         Home Medications    Prior to Admission medications   Medication Sig Start Date End Date Taking? Authorizing Provider  LEVEMIR FLEXTOUCH 100 UNIT/ML Pen INJECT 30 UNITS SUBCUTNEOUSLY A.C. Bid , Breakfast and supper. Patient taking differently: Inject 60-70 Units into the skin 2 (two) times daily. 70units in the am and 60units at night 06/11/15  Yes [name], MD  lisinopril (PRINIVIL,ZESTRIL) 10 MG tablet Take 1 tablet (10 mg total) by mouth daily. 04/04/15  Yes [name], MD  pantoprazole (PROTONIX) 20 MG tablet Take 1 tablet (20 mg total) by mouth daily. 11/22/15  Yes Milton Ferguson, MD  pravastatin (PRAVACHOL) 40 MG tablet Take 1 tablet (40 mg total) by mouth daily at 6 PM. 06/10/15  Yes [name], MD  traMADol (ULTRAM) 50 MG tablet Take 1 tablet (50 mg total) by mouth every 6 (six) hours as needed. Patient not taking: Reported on 01/04/2016 11/22/15   Milton Ferguson, MD    Family History Family History  Problem Relation Age of Onset  . Diabetes Father   .  Kidney failure Father     last 4 mnths of life  . Hypertension Mother     Social History Social History  Substance Use Topics  . Smoking status: Current Every Day Smoker    Packs/day: 1.00    Years: 8.00    Types: Cigarettes  . Smokeless tobacco: Never Used  . Alcohol use No     Allergies   Orange   Review of Systems Review of Systems  Constitutional: Negative for chills, fatigue  and fever.  HENT: Negative for congestion and rhinorrhea.   Eyes: Negative for visual disturbance.  Respiratory: Negative for cough, shortness of breath and wheezing.   Cardiovascular: Negative for chest pain and leg swelling.  Gastrointestinal: Positive for abdominal pain, diarrhea, nausea and vomiting.  Genitourinary: Positive for frequency. Negative for dysuria and flank pain.  Musculoskeletal: Negative for neck pain and neck stiffness.  Skin: Negative for rash and wound.  Allergic/Immunologic: Negative for immunocompromised state.  Neurological: Negative for syncope, weakness and headaches.     Physical Exam Updated Vital Signs BP (!) 157/92 (BP Location: Left Arm)   Pulse (!) 105   Temp 99 F (37.2 C) (Oral)   Resp 18   Ht 6\' 1"  (1.854 m)   Wt 147 lb 6.4 oz (66.9 kg)   SpO2 100%   BMI 19.45 kg/m   Physical Exam  Constitutional: He appears well-developed and well-nourished.  HENT:  Head: Normocephalic.  Dry mucous membranes  Eyes: Conjunctivae are normal.  Neck: Normal range of motion.  Cardiovascular: Normal rate, regular rhythm and normal heart sounds.  Exam reveals no friction rub.   No murmur heard. Pulmonary/Chest: Effort normal and breath sounds normal. No respiratory distress. He has no wheezes. He has no rales.  Lungs CTA bilaterally.   Abdominal: Soft. He exhibits no distension. There is tenderness.  Epigastric tenderness  Musculoskeletal: Normal range of motion.  Neurological: He is alert.  Skin: Skin is warm and dry.  Psychiatric: He has a normal mood and affect. His behavior is normal.  Nursing note and vitals reviewed.    ED Treatments / Results  Labs (all labs ordered are listed, but only abnormal results are displayed) Labs Reviewed  COMPREHENSIVE METABOLIC PANEL - Abnormal; Notable for the following:       Result Value   Sodium 128 (*)    Chloride 91 (*)    Glucose, Bld 558 (*)    BUN 23 (*)    Creatinine, Ser 1.42 (*)    Total Bilirubin  1.4 (*)    All other components within normal limits  CBC - Abnormal; Notable for the following:    RBC 3.97 (*)    Hemoglobin 11.9 (*)    HCT 34.9 (*)    Platelets 129 (*)    All other components within normal limits  URINALYSIS, ROUTINE W REFLEX MICROSCOPIC (NOT AT [facility]) - Abnormal; Notable for the following:    Color, Urine STRAW (*)    Glucose, UA >1000 (*)    Hgb urine dipstick LARGE (*)    Ketones, ur 15 (*)    Protein, ur 100 (*)    All other components within normal limits  BLOOD GAS, VENOUS - Abnormal; Notable for the following:    pH, Ven 7.409 (*)    pCO2, Ven 35.5 (*)    pO2, Ven 114.0 (*)    All other components within normal limits  URINE MICROSCOPIC-ADD ON - Abnormal; Notable for the following:    Squamous Epithelial /  LPF 0-5 (*)    Bacteria, UA RARE (*)    All other components within normal limits  BASIC METABOLIC PANEL - Abnormal; Notable for the following:    Sodium 132 (*)    Chloride 100 (*)    Glucose, Bld 298 (*)    BUN 22 (*)    Creatinine, Ser 1.37 (*)    Calcium 8.5 (*)    All other components within normal limits  GLUCOSE, CAPILLARY - Abnormal; Notable for the following:    Glucose-Capillary 407 (*)    All other components within normal limits  CBG MONITORING, ED - Abnormal; Notable for the following:    Glucose-Capillary 556 (*)    All other components within normal limits  CBG MONITORING, ED - Abnormal; Notable for the following:    Glucose-Capillary 564 (*)    All other components within normal limits  CBG MONITORING, ED - Abnormal; Notable for the following:    Glucose-Capillary 290 (*)    All other components within normal limits  LIPASE, BLOOD  BASIC METABOLIC PANEL  MAGNESIUM  HEMOGLOBIN A1C  IRON AND TIBC  VITAMIN B12  FOLATE RBC  CBC WITH DIFFERENTIAL/PLATELET  BASIC METABOLIC PANEL  CBG MONITORING, ED  CBG MONITORING, ED    EKG  EKG Interpretation  Date/Time:  Thursday April 15 2016 12:18:44 EDT Ventricular Rate:   103 PR Interval:    QRS Duration: 83 QT Interval:  337 QTC Calculation: 442 R Axis:   83 Text Interpretation:  Sinus tachycardia Benign early repolarization , unchanged No significant change since Confirmed by Brittne Kawasaki MD, Lysbeth Galas [id number]) on 04/15/2016 8:20:31 PM       Radiology No results found.  Procedures Procedures (including critical care time)  DIAGNOSTIC STUDIES: Oxygen Saturation is 100% on RA, normal by my interpretation.    COORDINATION OF CARE: 11:36 AM Discussed treatment plan with pt at bedside which includes lab work and pt agreed to plan.    Medications Ordered in ED Medications  pantoprazole (PROTONIX) EC tablet 40 mg (40 mg Oral Given 04/15/16 1754)  pravastatin (PRAVACHOL) tablet 40 mg (40 mg Oral Given 04/15/16 1754)  enoxaparin (LOVENOX) injection 40 mg (40 mg Subcutaneous Given 04/15/16 1755)  sodium chloride flush (NS) 0.9 % injection 3 mL (not administered)  0.9 %  sodium chloride infusion ( Intravenous New Bag/Given 04/15/16 1732)  acetaminophen (TYLENOL) tablet 650 mg (not administered)    Or  acetaminophen (TYLENOL) suppository 650 mg (not administered)  HYDROcodone-acetaminophen (NORCO/VICODIN) 5-325 MG per tablet 1-2 tablet (not administered)  ondansetron (ZOFRAN) tablet 4 mg ( Oral See Alternative 04/15/16 1754)    Or  ondansetron (ZOFRAN) injection 4 mg (4 mg Intravenous Given 04/15/16 1754)  insulin aspart (novoLOG) injection 0-15 Units (0 Units Subcutaneous Duplicate [id number])  insulin aspart (novoLOG) injection 4 Units (4 Units Subcutaneous Not Given 04/15/16 1755)  insulin detemir (LEVEMIR) injection 50 Units (50 Units Subcutaneous Given 04/15/16 1753)  hydrALAZINE (APRESOLINE) injection 10 mg (not administered)  feeding supplement (BOOST / RESOURCE BREEZE) liquid 1 Container (not administered)  sodium chloride 0.9 % bolus 1,000 mL (0 mLs Intravenous Stopped 04/15/16 1425)  sodium chloride 0.9 % bolus 1,000 mL (0 mLs Intravenous Stopped 04/15/16  1330)  ondansetron (ZOFRAN) injection 4 mg (4 mg Intravenous Given 04/15/16 1201)  insulin aspart (novoLOG) injection 8 Units (8 Units Subcutaneous Given 04/15/16 1256)  0.9 %  sodium chloride infusion ( Intravenous Stopped 04/15/16 1732)  insulin aspart (novoLOG) injection 15 Units (15 Units Subcutaneous Given 04/15/16 1755)  Initial Impression / Assessment and Plan / ED Course  I have reviewed the triage vital signs and the nursing notes.  Pertinent labs & imaging results that were available during my care of the patient were reviewed by me and considered in my medical decision making (see chart for details).  Clinical Course   30 yo M with PMHx of T1DM, h/o DKA 2/2 non-adherence, who presents with diffuse, mild abdominal cramping, nausea, and vomiting. Pt tachycardic, dry on exam but o/w non-toxic. Labs c/f hyperglycemic, hyperosmolar state with Glu 558 but CO2 23, pH wnl, and AG only 14 - no signs of significant DKA. UA with 15 ketones only, likely 2/2 dehydration. wBC normal with no RLQ TTP or signs of appendicitis or other infectious etiology. UA without UTI. Na 128 but likely 2/2 hyperglycemia, dehydration. Pt AOx4. Will give insulin SQ, IVF, admit to medicine.   Final Clinical Impressions(s) / ED Diagnoses   Final diagnoses:  Hyperglycemia  AKI (acute kidney injury) ([place])  Hyponatremia    New Prescriptions Current Discharge Medication List     I personally performed the services described in this documentation, which was scribed in my presence. The recorded information has been reviewed and is accurate.       Duffy Bruce, MD 04/15/16 2023

## 2016-04-15 NOTE — H&P (Signed)
History and Physical    Nathaniel [name] [id number] DOB: [birth year] DOA: 04/15/2016  PCP: Maricela Curet, MD   Patient coming from: Home   Chief Complaint: Nausea, vomiting  HPI: Nathaniel [name] is a 30 y.o. male with medical history significant for type 1 diabetes mellitus, hypertension, hyperlipidemia, and nonadherence to treatment plan who presents the emergency department with 1 day of nausea, vomiting, and generalized abdominal discomfort. Patient reports being in his usual state of health until running out of insulin 2 days ago. He reports that he has used his available insulin and his refill is not available until tomorrow. He continued to remain in his usual state until the insidious development of generalized abdominal discomfort, followed by nausea and nonbloody nonbilious vomiting yesterday. He also had one episode of loose stools yesterday. Vomiting continued throughout the night and into the day today. Patient is had no appetite and very little oral intake over this interval. He denies any fevers, chills, abdominal pain, chest pain, palpitations, dyspnea, or cough. There has been no sick contacts or recent long distance travel. He denies melena, hematochezia, or hematemesis. He denies any significant alcohol use or use of illicit substances.  ED Course: Upon arrival to the ED, patient is found to be afebrile, saturating well on room air, tachycardic in the 110s, and with vitals otherwise stable. EKG demonstrates sinus tachycardia with rate 103. CMP features a sodium of 128, chloride 91, BUN 23, creatinine 1.42, and glucose 558. CBC is notable for a stable normocytic anemia with hemoglobin of 11.9, and a new thrombocytopenia with platelet count 129,000. Urinalysis features greater than 1000 glucose and 15 ketones. A bolus of 2 L normal saline was administered in the emergency department. Patient was treated with 8 units of subcutaneous NovoLog and 4 mg IV Zofran. Tachycardia  improved following the fluid bolus and patient has begun to enjoy some subjective improvement as well. He has remained hemodynamically stable and will be observed on the telemetry unit for ongoing evaluation and management of HHS with subsequent dehydration, AKI, and electrolyte derangements, suspected secondary to nonadherence with his insulin regimen.  Review of Systems:  All other systems reviewed and apart from HPI, are negative.  Past Medical History:  Diagnosis Date  . Diabetes mellitus    lantus/novolog  . Hyperlipidemia   . Hypertension   . Marijuana abuse    occaisionally  . Noncompliance with medication regimen   . Tobacco abuse    5/day    Past Surgical History:  Procedure Laterality Date  . EYE SURGERY       reports that he has been smoking Cigarettes.  He has a 8.00 pack-year smoking history. He has never used smokeless tobacco. He reports that he uses drugs, including Marijuana. He reports that he does not drink alcohol.  Allergies  Allergen Reactions  . Orange     Increases blood sugar immediately     Family History  Problem Relation Age of Onset  . Diabetes Father   . Kidney failure Father     last 4 mnths of life  . Hypertension Mother      Prior to Admission medications   Medication Sig Start Date End Date Taking? Authorizing Provider  LEVEMIR FLEXTOUCH 100 UNIT/ML Pen INJECT 30 UNITS SUBCUTNEOUSLY A.C. Bid , Breakfast and supper. Patient taking differently: Inject 60-70 Units into the skin 2 (two) times daily. 70units in the am and 60units at night 06/11/15  Yes [name], MD  lisinopril (PRINIVIL,ZESTRIL) 10 MG tablet  Take 1 tablet (10 mg total) by mouth daily. 04/04/15  Yes [name], MD  pantoprazole (PROTONIX) 20 MG tablet Take 1 tablet (20 mg total) by mouth daily. 11/22/15  Yes Milton Ferguson, MD  pravastatin (PRAVACHOL) 40 MG tablet Take 1 tablet (40 mg total) by mouth daily at 6 PM. 06/10/15  Yes [name], MD  traMADol  (ULTRAM) 50 MG tablet Take 1 tablet (50 mg total) by mouth every 6 (six) hours as needed. Patient not taking: Reported on 01/04/2016 11/22/15   Milton Ferguson, MD    Physical Exam: Vitals:   04/15/16 1430 04/15/16 1500 04/15/16 1530 04/15/16 1600  BP: 103/58 111/64 130/82 125/74  Pulse: 99 98 101 102  Resp: 15 15 15 18   Temp:      TempSrc:      SpO2: 100% 100% 100% 100%  Weight:      Height:          Constitutional: NAD, calm, comfortable Eyes: PERTLA, lids and conjunctivae normal ENMT: Mucous membranes are moist. Posterior pharynx clear of any exudate or lesions.   Neck: normal, supple, no masses, no thyromegaly Respiratory: clear to auscultation bilaterally, no wheezing, no crackles. Normal respiratory effort.    Cardiovascular: S1 & S2 heard, regular rate and rhythm, hyperdynamic precordium. No extremity edema. 2+ pedal pulses. No significant JVD. Abdomen: No distension, mild tenderness throughout, no rebound pain or guarding, no masses palpated. Bowel sounds normal.  Musculoskeletal: no clubbing / cyanosis. No joint deformity upper and lower extremities. Normal muscle tone.  Skin: Superficial excoriations about the BLE's. Warm, dry, well-perfused. Neurologic: CN 2-12 grossly intact. Sensation intact, DTR normal. Strength 5/5 in all 4 limbs.  Psychiatric: Normal judgment and insight. Alert and oriented x 3. Normal mood and affect.     Labs on Admission: I have personally reviewed following labs and imaging studies  CBC:  Recent Labs Lab 04/15/16 1110  WBC 9.8  HGB 11.9*  HCT 34.9*  MCV 87.9  PLT [id number]*   Basic Metabolic Panel:  Recent Labs Lab 04/15/16 1110  NA 128*  K 4.8  CL 91*  CO2 23  GLUCOSE 558*  BUN 23*  CREATININE 1.42*  CALCIUM 9.1   GFR: Estimated Creatinine Clearance: 86.7 mL/min (by C-G formula based on SCr of 1.42 mg/dL). Liver Function Tests:  Recent Labs Lab 04/15/16 1110  AST 19  ALT 18  ALKPHOS 88  BILITOT 1.4*  PROT 8.1  ALBUMIN  4.0    Recent Labs Lab 04/15/16 1110  LIPASE 22   No results for input(s): AMMONIA in the last 168 hours. Coagulation Profile: No results for input(s): INR, PROTIME in the last 168 hours. Cardiac Enzymes: No results for input(s): CKTOTAL, CKMB, CKMBINDEX, TROPONINI in the last 168 hours. BNP (last 3 results) No results for input(s): PROBNP in the last 8760 hours. HbA1C: No results for input(s): HGBA1C in the last 72 hours. CBG:  Recent Labs Lab 04/15/16 1116 04/15/16 1255 04/15/16 1512  GLUCAP 556* 564* 290*   Lipid Profile: No results for input(s): CHOL, HDL, LDLCALC, TRIG, CHOLHDL, LDLDIRECT in the last 72 hours. Thyroid Function Tests: No results for input(s): TSH, T4TOTAL, FREET4, T3FREE, THYROIDAB in the last 72 hours. Anemia Panel: No results for input(s): VITAMINB12, FOLATE, FERRITIN, TIBC, IRON, RETICCTPCT in the last 72 hours. Urine analysis:    Component Value Date/Time   COLORURINE STRAW (A) 04/15/2016 1220   APPEARANCEUR CLEAR 04/15/2016 1220   LABSPEC 1.020 04/15/2016 1220   PHURINE 5.5 04/15/2016 1220  GLUCOSEU >1000 (A) 04/15/2016 1220   HGBUR LARGE (A) 04/15/2016 1220   BILIRUBINUR NEGATIVE 04/15/2016 1220   KETONESUR 15 (A) 04/15/2016 1220   PROTEINUR 100 (A) 04/15/2016 1220   UROBILINOGEN 0.2 06/07/2015 1815   NITRITE NEGATIVE 04/15/2016 1220   LEUKOCYTESUR NEGATIVE 04/15/2016 1220   Sepsis Labs: @LABRCNTIP (procalcitonin:4,lacticidven:4) )No results found for this or any previous visit (from the past 240 hour(s)).   Radiological Exams on Admission: No results found.  EKG: Independently reviewed. Sinus tachycardia (rate 103)   Assessment/Plan  1. Hyperosmolar hyperglycemic state in type I DM  - Serum glucose 558 on presentation with bicarb 23, AG 14, Na 128 - Suspected secondary to missed insulin doses  - No suggestion of infectious precipitant or other serious medical illness  - Received 8 units sq Novolog in ED with improvement in CBG  to 290 - Will continue management with sq insulin  - Start basal coverage with Levemir 50 units BID; 4 units Humalog with meals, and moderate-intensity SSI; adjust prn  - At home, is prescribed Levemir 60 units qAM and 70 units qPM  - A1c was 10.1% in April 2017, reflecting poor glycemic-control   2. AKI  - SCr 1.42, up from apparent baseline of 0.9  - Suspect this represents a prerenal azotemia in setting of N/V with poor oral intake, exacerbated by lisinopril use  - Continue fluid-resuscitation - Avoid nephrotoxins, hold lisinopril for now  - Serial chem panels until renal fxn and lytes stabilized    3. Nausea, vomiting - Suspected secondary to #1 above  - Replacing fluid-losses and correcting lytes  - Treating underlying illness as above  - Symptomatic care with prn Zofran   4. Hypertension - Modestly elevated in ED, likely influenced by N/V  - Managed at home with lisinopril, held for now as above  - For now, will treat with prn hydralazine IVP's; resume lisinopril as appropriate    5. Hyponatremia, pseudohyponatremia - Serum sodium 128 on admission  - Some of this likely factitious in setting of uncontrolled glucose; dehydration also may be contributing  - Fluid-resuscitating  - Repeat chem panel   6. Normocytic anemia, thrombocytopenia - Hgb 11.9, platelet count 129,000 on admission  - Hgb appears stable relative to prior measurements; thrombocytopenia is new  - Check iron studies, B12, and folate; supplement prn  - Repeat CBC in am    DVT prophylaxis: sq Lovenox  Code Status: Full  Family Communication: Discussed with patient  Disposition Plan: Observe on telemetry  Consults called: None Admission status: Observation    Vianne Bulls, MD [facility] Pager [phone]  If 7PM-7AM, please contact night-coverage [email or web address] Password [facility]  04/15/2016, 4:34 PM

## 2016-04-15 NOTE — ED Notes (Signed)
CRITICAL VALUE ALERT  Critical value received:  Glucose 558  Date of notification:  04/15/2016  Time of notification:  [id number]  Critical value read back: yes  Nurse who received alert:  Aaron Edelman RN  MD notified (1st page):   Time of first page:    MD notified (2nd page):  Time of second page:  Responding MD:    Time MD responded:

## 2016-04-15 NOTE — ED Triage Notes (Signed)
Pt reports onset of nausea and vomiting yesterday afternoon. Pt states he had some diarrhea yesterday but none today. Pt has been unable to keep down food or liquids.

## 2016-04-15 NOTE — ED Notes (Signed)
cbg of 564.

## 2016-04-16 DIAGNOSIS — D649 Anemia, unspecified: Secondary | ICD-10-CM | POA: Diagnosis present

## 2016-04-16 DIAGNOSIS — E1065 Type 1 diabetes mellitus with hyperglycemia: Secondary | ICD-10-CM | POA: Diagnosis present

## 2016-04-16 DIAGNOSIS — E871 Hypo-osmolality and hyponatremia: Secondary | ICD-10-CM | POA: Diagnosis present

## 2016-04-16 DIAGNOSIS — F17210 Nicotine dependence, cigarettes, uncomplicated: Secondary | ICD-10-CM | POA: Diagnosis present

## 2016-04-16 DIAGNOSIS — R112 Nausea with vomiting, unspecified: Secondary | ICD-10-CM | POA: Diagnosis present

## 2016-04-16 DIAGNOSIS — I10 Essential (primary) hypertension: Secondary | ICD-10-CM | POA: Diagnosis present

## 2016-04-16 DIAGNOSIS — Z794 Long term (current) use of insulin: Secondary | ICD-10-CM | POA: Diagnosis not present

## 2016-04-16 DIAGNOSIS — Z841 Family history of disorders of kidney and ureter: Secondary | ICD-10-CM | POA: Diagnosis not present

## 2016-04-16 DIAGNOSIS — D696 Thrombocytopenia, unspecified: Secondary | ICD-10-CM | POA: Diagnosis present

## 2016-04-16 DIAGNOSIS — E860 Dehydration: Secondary | ICD-10-CM | POA: Diagnosis present

## 2016-04-16 DIAGNOSIS — N179 Acute kidney failure, unspecified: Secondary | ICD-10-CM | POA: Diagnosis present

## 2016-04-16 DIAGNOSIS — Z9114 Patient's other noncompliance with medication regimen: Secondary | ICD-10-CM | POA: Diagnosis not present

## 2016-04-16 DIAGNOSIS — E785 Hyperlipidemia, unspecified: Secondary | ICD-10-CM | POA: Diagnosis present

## 2016-04-16 DIAGNOSIS — Z8249 Family history of ischemic heart disease and other diseases of the circulatory system: Secondary | ICD-10-CM | POA: Diagnosis not present

## 2016-04-16 DIAGNOSIS — Z833 Family history of diabetes mellitus: Secondary | ICD-10-CM | POA: Diagnosis not present

## 2016-04-16 LAB — GLUCOSE, CAPILLARY
Glucose-Capillary: 153 mg/dL — ABNORMAL HIGH (ref 65–99)
Glucose-Capillary: 179 mg/dL — ABNORMAL HIGH (ref 65–99)
Glucose-Capillary: 153 mg/dL — ABNORMAL HIGH (ref 65–99)
Glucose-Capillary: 167 mg/dL — ABNORMAL HIGH (ref 65–99)
Glucose-Capillary: 126 mg/dL — ABNORMAL HIGH (ref 65–99)
Glucose-Capillary: 117 mg/dL — ABNORMAL HIGH (ref 65–99)
Glucose-Capillary: 81 mg/dL (ref 65–99)
Glucose-Capillary: 97 mg/dL (ref 65–99)
Glucose-Capillary: 63 mg/dL — ABNORMAL LOW (ref 65–99)
Glucose-Capillary: 112 mg/dL — ABNORMAL HIGH (ref 65–99)
Glucose-Capillary: 212 mg/dL — ABNORMAL HIGH (ref 65–99)

## 2016-04-16 LAB — HEMOGLOBIN A1C
Hgb A1c MFr Bld: 9.5 % — ABNORMAL HIGH (ref 4.8–5.6)
MEAN PLASMA GLUCOSE: 226 mg/dL

## 2016-04-16 LAB — BASIC METABOLIC PANEL
ANION GAP: 10 (ref 5–15)
BUN: 17 mg/dL (ref 6–20)
CALCIUM: 8.8 mg/dL — AB (ref 8.9–10.3)
CO2: 23 mmol/L (ref 22–32)
CREATININE: 1.24 mg/dL (ref 0.61–1.24)
Chloride: 103 mmol/L (ref 101–111)
GFR calc Af Amer: >60 mL/min (ref >60)
GFR calc non Af Amer: >60 mL/min (ref >60)
Glucose, Bld: 157 mg/dL — ABNORMAL HIGH (ref 65–99)
POTASSIUM: 3.8 mmol/L (ref 3.5–5.1)
SODIUM: 136 mmol/L (ref 135–145)

## 2016-04-16 LAB — CBC WITH DIFFERENTIAL/PLATELET
BASOS ABS: 0 10*3/uL (ref 0.0–0.1)
Basophils Relative: 0 %
EOS ABS: 0.1 10*3/uL (ref 0.0–0.7)
EOS PCT: 0 %
HCT: 36.8 % — ABNORMAL LOW (ref 39.0–52.0)
Hemoglobin: 12.6 g/dL — ABNORMAL LOW (ref 13.0–17.0)
Lymphocytes Relative: 26 %
Lymphs Abs: 3 10*3/uL (ref 0.7–4.0)
MCH: 30.3 pg (ref 26.0–34.0)
MCHC: 34.2 g/dL (ref 30.0–36.0)
MCV: 88.5 fL (ref 78.0–100.0)
Monocytes Absolute: 0.7 10*3/uL (ref 0.1–1.0)
Monocytes Relative: 6 %
Neutro Abs: 7.7 10*3/uL (ref 1.7–7.7)
Neutrophils Relative %: 67 %
PLATELETS: 297 10*3/uL (ref 150–400)
RBC: 4.16 MIL/uL — AB (ref 4.22–5.81)
RDW: 13.9 % (ref 11.5–15.5)
WBC: 11.4 10*3/uL — AB (ref 4.0–10.5)

## 2016-04-16 LAB — FOLATE RBC
FOLATE, HEMOLYSATE: 288.6 ng/mL
FOLATE, RBC: 880 ng/mL (ref >498)
Hematocrit: 32.8 % — ABNORMAL LOW (ref 37.5–51.0)

## 2016-04-16 MED ORDER — DEXTROSE 50 % IV SOLN: 1.0000 | Freq: Once | INTRAVENOUS | Status: DC

## 2016-04-16 MED ORDER — DEXTROSE 50 % IV SOLN
INTRAVENOUS | Status: AC
  Administered 2016-04-16: 50 mL via INTRAVENOUS
  Filled 2016-04-16: qty 50

## 2016-04-16 MED ORDER — INSULIN DETEMIR 100 UNIT/ML ~~LOC~~ SOLN
10.0000 [IU] | Freq: Two times a day (BID) | SUBCUTANEOUS | Status: DC
  Administered 2016-04-16 – 2016-04-17 (×2): 10 [IU] via SUBCUTANEOUS
  Filled 2016-04-16 (×6): qty 0.1

## 2016-04-16 MED ORDER — INSULIN DETEMIR 100 UNIT/ML ~~LOC~~ SOLN
25.0000 [IU] | Freq: Two times a day (BID) | SUBCUTANEOUS | Status: DC
Start: 2016-04-16 — End: 2016-04-16
  Filled 2016-04-16 (×7): qty 0.25

## 2016-04-16 MED ORDER — GLUCOSE 40 % PO GEL
ORAL | Status: AC
  Filled 2016-04-16: qty 1

## 2016-04-16 MED ORDER — AMLODIPINE BESYLATE 5 MG PO TABS
5.0000 mg | ORAL_TABLET | Freq: Every day | ORAL | Status: DC
  Administered 2016-04-16 – 2016-04-17 (×2): 5 mg via ORAL
  Filled 2016-04-16 (×2): qty 1

## 2016-04-16 MED ORDER — SODIUM CHLORIDE 0.9 % IV SOLN
INTRAVENOUS | Status: DC
  Administered 2016-04-17: 06:00:00 via INTRAVENOUS

## 2016-04-16 MED ORDER — DEXTROSE 50 % IV SOLN
1.0000 | Freq: Once | INTRAVENOUS | Status: AC
  Administered 2016-04-16 (×2): 50 mL via INTRAVENOUS

## 2016-04-16 MED ORDER — INSULIN ASPART 100 UNIT/ML ~~LOC~~ SOLN
0.0000 [IU] | Freq: Three times a day (TID) | SUBCUTANEOUS | Status: DC
  Administered 2016-04-17: 5 [IU] via SUBCUTANEOUS

## 2016-04-16 MED ORDER — GLUCERNA SHAKE PO LIQD
237.0000 mL | Freq: Three times a day (TID) | ORAL | Status: DC
  Administered 2016-04-16 – 2016-04-17 (×4): 237 mL via ORAL

## 2016-04-16 NOTE — Progress Notes (Signed)
Type I diabetic admitted with noncompliance with twice a day Levemir regimen says he ran out and we will glucose of 458 non-acidotic currently on left. 30 twice a day he's hypoglycemic on a clear liquid diet response to this is increasing to regular diet1 and augment insulin as required currently on Levemir 15 twice a day I suspect she'll have increased demands with changing diet be met scheduled for a Nathaniel [name] DOB: 16-Sep-1986 DOA: 04/15/2016 PCP: Maricela Curet, MD   Physical Exam: Blood pressure 120/82, pulse 87, temperature 97.9 F (36.6 C), temperature source Oral, resp. rate 18, height '6\' 1"'  (1.854 [name]), weight 66.9 kg (147 lb 6.4 oz), SpO2 100 %. Lungs clear to A&P no rales wheeze rhonchi heart regular rhythm no murmurs gallops he feels rubs abdomen soft nontender bowel sounds normoactive   Investigations:  No results found for this or any previous visit (from the past 240 hour(s)).   Basic Metabolic Panel:  Recent Labs  04/15/16 2158 04/16/16 0603  NA 133* 136  K 3.9 3.8  CL 99* 103  CO2 25 23  GLUCOSE 132* 157*  BUN 20 17  CREATININE 1.26* 1.24  CALCIUM 8.8* 8.8*  MG 1.7  --    Liver Function Tests:  Recent Labs  04/15/16 1110  AST 19  ALT 18  ALKPHOS 88  BILITOT 1.4*  PROT 8.1  ALBUMIN 4.0     CBC:  Recent Labs  04/15/16 1110 04/16/16 0603  WBC 9.8 11.4*  NEUTROABS  --  7.7  HGB 11.9* 12.6*  HCT 34.9* 36.8*  MCV 87.9 88.5  PLT 129* 297    No results found.    Medications:  Impression:  Principal Problem:   AKI (acute kidney injury) ([place]) Active Problems:   Dehydration   Hyperglycemia without ketosis   Intractable nausea and vomiting   Chronic hypertension   Noncompliance with medications   Hyponatremia   Hyperglycemia     Plan:Increase to regular diet. Continue Levemir will increase Levemir with increased diet this evening monitor glucoses consider early discharge    Consultants: None   Procedures   Antibiotics:           Time spent: 30 minutes   LOS: 0 days   Nathaniel [name]   04/16/2016, 12:18 PM

## 2016-04-16 NOTE — Care Management Obs Status (Signed)
[place] NOTIFICATION   Patient Details  Name: Nathaniel [name] MRN: [id number] Date of Birth: Oct 09, 1985   Medicare Observation Status Notification Given:  Yes    Sherald Barge, RN 04/16/2016, 8:57 AM

## 2016-04-16 NOTE — Progress Notes (Signed)
Hypoglycemic Event  CBG: Panic Lo at 0441  Treatment: D50 IV 50 mL x2  Symptoms: Sweaty, Shaky and Nervous/irritable  Follow-up CBG: Time:0510 CBG Result:179  Possible Reasons for Event: Vomiting and Inadequate meal intake  Comments/MD notified: Patient remained responsive with jerky movements that appeared to be nearing seizure activity.  Dr. Darrick Meigs notified of the information above and new orders were given and placed into [facility].  Repeat CBG at 0523 is 153.  Patient remains alert and oriented and reports he feels better.  He is nolonger diaphoretic and shaky.  Repeat CBG at 0558 is 153 and at 0625 it is 167.  Nursing staff to continue to monitor    Felecia Jan

## 2016-04-16 NOTE — Progress Notes (Signed)
Initial Nutrition Assessment  INTERVENTION:  Diet as tolerated  Pt was offered nutrition education-but he is not interested  NUTRITION DIAGNOSIS:   Inadequate oral intake related to social / environmental circumstances as evidenced by per patient/family report.  GOAL:   Patient will meet greater than or equal to 90% of their needs   MONITOR:   Labs, Weight trends, PO intake  REASON FOR ASSESSMENT:   Malnutrition Screening Tool    ASSESSMENT:  Per MD: Nathaniel [name] is a 30 y.o. male with medical history significant for type 1 diabetes mellitus, hypertension, hyperlipidemia, and nonadherence to treatment plan who presents the emergency department with 1 day of nausea, vomiting, and generalized abdominal discomfort. Patient reports being in his usual state of health until running out of insulin 2 days ago. He reports that he has used his available insulin and his refill is not available until tomorrow. He continued to remain in his usual state until the insidious development of generalized abdominal discomfort, followed by nausea and nonbloody nonbilious vomiting yesterday. He also had one episode of loose stools yesterday. Vomiting continued throughout the night and into the day today. Patient is had no appetite and very little oral intake over this interval  Patient is very quiet and reluctant to provide diet hx or participate in nutrition focused exam. He did tolerate his juice this morning and had potato soup for lunch. His diet is fully advanced for dinner but pt says he's not very hungry. He follows a regular diet and doesn't check his blood glucose. Offered to provide nutrition education related to low sodium diet and cho counting but pt declined. His weight is down 6% since April but not clinically significant yet. suspect this gentleman is malnourished but unable to specify at this time.  Recent Labs Lab 04/15/16 1537 04/15/16 2158 04/16/16 0603  NA 132* 133* 136  K  4.1 3.9 3.8  CL 100* 99* 103  CO2 22 25 23   BUN 22* 20 17  CREATININE 1.37* 1.26* 1.24  CALCIUM 8.5* 8.8* 8.8*  MG  --  1.7  --   GLUCOSE 298* 132* 157*   Labs: sodium WNL now and Creat improved to normal range. His CBG's better: [id number].  Medications: his insulin dosing was adjusted.   Diet Order:  Diet regular Room service appropriate? Yes; Fluid consistency: Thin  Skin:  Reviewed, no issues  Last BM:  7/26   Height:   Ht Readings from Last 1 Encounters:  04/15/16 6\' 1"  (1.854 m)    Weight:   Wt Readings from Last 1 Encounters:  04/15/16 147 lb 6.4 oz (66.9 kg)    Ideal Body Weight:  84 kg  BMI:  Body mass index is 19.45 kg/m.  Estimated Nutritional Needs:   Kcal:  [id number]   Protein:  85-90 gr  Fluid:  2.0-2.3 liters daily  EDUCATION NEEDS:   Education needs addressed  Colman Cater MS,RD,CSG,LDN Office: [phone] Pager: [phone]

## 2016-04-16 NOTE — Progress Notes (Addendum)
Inpatient Diabetes Program Recommendations  AACE/ADA: New Consensus Statement on Inpatient Glycemic Control (2015)  Target Ranges:  Prepandial:   less than 140 mg/dL      Peak postprandial:   less than 180 mg/dL (1-2 hours)      Critically ill patients:  140 - 180 mg/dL   Results for Nathaniel [name], Nathaniel [name] (MRN [id number]) as of 04/16/2016 10:43  Ref. Range 04/15/2016 11:16 04/15/2016 12:55 04/15/2016 15:12 04/15/2016 17:32 04/15/2016 20:53 04/16/2016 04:41 04/16/2016 04:52 04/16/2016 05:10 04/16/2016 05:23 04/16/2016 05:58 04/16/2016 06:25 04/16/2016 08:11 04/16/2016 09:11 04/16/2016 10:28  Glucose-Capillary Latest Ref Range: 65 - 99 mg/dL 556 (HH) 564 (HH) 290 (H) 407 (H) 171 (H) <10 (LL) <10 (LL) 179 (H) 153 (H) 153 (H) 167 (H) 126 (H) 117 (H) 81    Review of Glycemic Control  Diabetes history: DM 1 Outpatient Diabetes medications: Levemir 70 units am + 60 units pm Current orders for Inpatient glycemic control: Levemir 25 units bid + Novolog correction 0-15 units tid + meal coverage 4 units tid  Inpatient Diabetes Program Recommendations:  Noted CBG @0452  am <10 and treated with dextrose. Please consider decrease in Lantus to 20 units and decrease Novolog correction scale to sensitive 0-9 units tid. Patient is type 1. Spoke with Dr. [name] and received orders to decrease Levemir to 10 units bid and decrease Novolog correction to sensitive scale 0-9 units tid.  Thank you, Nani Gasser. Adrijana Haros, RN, MSN, CDE Inpatient Glycemic Control Team Team Pager [phone] (8am-5pm) 04/16/2016 10:47 AM

## 2016-04-16 NOTE — Progress Notes (Signed)
Hypoglycemic Event  CBG: 63  Treatment: 15 GM carbohydrate snack  Symptoms: None  Follow-up CBG: Time:1729 CBG Result 112  Possible Reasons for Event: Inadequate meal intake  Comments/MD notifiedDr. [name] notified    Bunny Kleist Morton Peters

## 2016-04-16 NOTE — Care Management Note (Signed)
Case Management Note  Patient Details  Name: Nathaniel [name] MRN: [id number] Date of Birth: 10-25-1985  Subjective/Objective:                  Pt admitted with AKI. He is from home, lives with his mother and is ind with ADL's. He has PCP and drives himself to appointments. He has insurance with drug coverage but does not know what kind of drug policy he has. Says he was unable to get insulin because it was not yet time for it to be re-filled. He is eligible to have it re-filled today. Anticipate DC home with self care.   Action/Plan: No CM needs.   Expected Discharge Date:    04/17/2016              Expected Discharge Plan:  Home/Self Care  In-House Referral:  NA  Discharge planning Services  CM Consult  Post Acute Care Choice:  NA Choice offered to:  NA  DME Arranged:    DME Agency:     HH Arranged:    HH Agency:     Status of Service:  Completed, signed off  If discussed at [employer] of Stay Meetings, dates discussed:    Additional Comments:  Sherald Barge, RN 04/16/2016, 9:20 AM

## 2016-04-17 LAB — BASIC METABOLIC PANEL
ANION GAP: 8 (ref 5–15)
BUN: 8 mg/dL (ref 6–20)
CO2: 23 mmol/L (ref 22–32)
Calcium: 8.3 mg/dL — ABNORMAL LOW (ref 8.9–10.3)
Chloride: 102 mmol/L (ref 101–111)
Creatinine, Ser: 0.99 mg/dL (ref 0.61–1.24)
GFR calc Af Amer: >60 mL/min (ref >60)
Glucose, Bld: 232 mg/dL — ABNORMAL HIGH (ref 65–99)
POTASSIUM: 3.7 mmol/L (ref 3.5–5.1)
SODIUM: 133 mmol/L — AB (ref 135–145)

## 2016-04-17 LAB — GLUCOSE, CAPILLARY: Glucose-Capillary: 232 mg/dL — ABNORMAL HIGH (ref 65–99)

## 2016-04-17 MED ORDER — AMLODIPINE BESYLATE 5 MG PO TABS: 5.0000 mg | ORAL_TABLET | Freq: Every day | ORAL | 3 refills | Status: DC

## 2016-04-17 MED ORDER — PANTOPRAZOLE SODIUM 40 MG PO TBEC: 40.0000 mg | DELAYED_RELEASE_TABLET | Freq: Every day | ORAL | 2 refills | Status: DC

## 2016-04-17 MED ORDER — LEVEMIR FLEXTOUCH 100 UNIT/ML ~~LOC~~ SOPN: PEN_INJECTOR | SUBCUTANEOUS | 11 refills | Status: DC

## 2016-04-17 NOTE — Progress Notes (Signed)
Pt discharged home today per Dr. [name].  Pt's IV site D/C'd and WDL.  Pt's VSS.  Pt provided with home medication list, discharge instructions and prescriptions.  Verbalized understanding.  Pt left floor in stable condition accompanied by RN.

## 2016-04-17 NOTE — Discharge Summary (Signed)
Physician Discharge Summary  Nathaniel [name] [id number] DOB: 08-23-1986 DOA: 04/15/2016  PCP: Maricela Curet, MD  Admit date: 04/15/2016 Discharge date: 04/17/2016   Recommendations for Outpatient Follow-up:  Patient is advised to take Levemir 30 units subcutaneous twice a day additionally take Protonix 40 mg by mouth daily and to take amlodipine 5 mg by mouth daily for control of hypertension in addition to all his other prehospital medications likewise he is advised to follow-up in my office in 4-5 days' time to assess hypertension control and glycemic control as well as renal function Discharge Diagnoses:  Principal Problem:   AKI (acute kidney injury) ([place]) Active Problems:   Dehydration   Hyperglycemia without ketosis   Intractable nausea and vomiting   Chronic hypertension   Noncompliance with medications   Hyponatremia   Hyperglycemia   Discharge Condition: Good  Filed Weights   04/15/16 1048 04/15/16 1722 04/17/16 0630  Weight: 83.9 kg (185 lb) 66.9 kg (147 lb 6.4 oz) 68.9 kg (151 lb 12.8 oz)    History of present illness:  Patient is a 30 year old black male type 1 insulin-dependent diabetic who has problems with chronic noncompliance he states he ran out of medicine stopped his insulin can with hyperosmolar nonketotic state with nausea and vomiting he was treated with antibiotics aggressive fluid resuscitation he likewise had a bump in his creatinine he was given Levemir pushes his normal outpatient medicine and over 3-4 day. He achieved good glycemic control he had problems with hypertension in hospital and in addition to his lisinopril 10 mg he had Norvasc 5 mg added for this. His Levemir was decreased from 60 units twice a day to 30 units twice a day as this is what he was controlled upon in hospital he will follow-up in the office in 4-5 days' time to assess glycemic control hypertensive control and renal function which returned to baseline at the time of  discharge  Hospital Course:  See history of present illness above Procedures:     Consultations:    Discharge Instructions  Discharge Instructions    Discharge instructions    Complete by:  As directed   Discharge patient    Complete by:  As directed       Medication List    STOP taking these medications   traMADol 50 MG tablet Commonly known as:  ULTRAM     TAKE these medications   amLODipine 5 MG tablet Commonly known as:  NORVASC Take 1 tablet (5 mg total) by mouth daily.   LEVEMIR FLEXTOUCH 100 UNIT/ML Pen Generic drug:  Insulin Detemir INJECT 30 UNITS SUBCUTNEOUSLY A.C. Bid , Breakfast and supper. What changed:  how much to take  how to take this  when to take this  additional instructions   lisinopril 10 MG tablet Commonly known as:  PRINIVIL,ZESTRIL Take 1 tablet (10 mg total) by mouth daily.   pantoprazole 40 MG tablet Commonly known as:  PROTONIX Take 1 tablet (40 mg total) by mouth daily. What changed:  medication strength  how much to take   pravastatin 40 MG tablet Commonly known as:  PRAVACHOL Take 1 tablet (40 mg total) by mouth daily at 6 PM.      Allergies  Allergen Reactions  . Orange     Increases blood sugar immediately       The results of significant diagnostics from this hospitalization (including imaging, microbiology, ancillary and laboratory) are listed below for reference.    Significant Diagnostic Studies: No  results found.  Microbiology: No results found for this or any previous visit (from the past 240 hour(s)).   Labs: Basic Metabolic Panel:  Recent Labs Lab 04/15/16 1110 04/15/16 1537 04/15/16 2158 04/16/16 0603 04/17/16 0620  NA 128* 132* 133* 136 133*  K 4.8 4.1 3.9 3.8 3.7  CL 91* 100* 99* 103 102  CO2 23 22 25 23 23   GLUCOSE 558* 298* 132* 157* 232*  BUN 23* 22* 20 17 8   CREATININE 1.42* 1.37* 1.26* 1.24 0.99  CALCIUM 9.1 8.5* 8.8* 8.8* 8.3*  MG  --   --  1.7  --   --    Liver  Function Tests:  Recent Labs Lab 04/15/16 1110  AST 19  ALT 18  ALKPHOS 88  BILITOT 1.4*  PROT 8.1  ALBUMIN 4.0    Recent Labs Lab 04/15/16 1110  LIPASE 22   No results for input(s): AMMONIA in the last 168 hours. CBC:  Recent Labs Lab 04/15/16 1110 04/15/16 1650 04/16/16 0603  WBC 9.8  --  11.4*  NEUTROABS  --   --  7.7  HGB 11.9*  --  12.6*  HCT 34.9* 32.8* 36.8*  MCV 87.9  --  88.5  PLT 129*  --  297   Cardiac Enzymes: No results for input(s): CKTOTAL, CKMB, CKMBINDEX, TROPONINI in the last 168 hours. BNP: BNP (last 3 results) No results for input(s): BNP in the last 8760 hours.  ProBNP (last 3 results) No results for input(s): PROBNP in the last 8760 hours.  CBG:  Recent Labs Lab 04/16/16 1137 04/16/16 1614 04/16/16 1729 04/16/16 2020 04/17/16 0732  GLUCAP 97 63* 112* 212* 232*       Signed:  [facility] Pager: [phone] 04/17/2016, 7:56 AM

## 2016-05-14 ENCOUNTER — Emergency Department (HOSPITAL_COMMUNITY): Payer: Medicare Other

## 2016-05-14 ENCOUNTER — Emergency Department (HOSPITAL_COMMUNITY)
Admission: EM | Admit: 2016-05-14 | Discharge: 2016-05-14 | Disposition: A | Discharge status: Home / Self Care | Payer: Medicare Other | Attending: Emergency Medicine | Admitting: Emergency Medicine

## 2016-05-14 ENCOUNTER — Encounter (HOSPITAL_COMMUNITY): Payer: Self-pay

## 2016-05-14 DIAGNOSIS — F17210 Nicotine dependence, cigarettes, uncomplicated: Secondary | ICD-10-CM | POA: Insufficient documentation

## 2016-05-14 DIAGNOSIS — R112 Nausea with vomiting, unspecified: Secondary | ICD-10-CM

## 2016-05-14 DIAGNOSIS — E119 Type 2 diabetes mellitus without complications: Secondary | ICD-10-CM | POA: Insufficient documentation

## 2016-05-14 DIAGNOSIS — I10 Essential (primary) hypertension: Secondary | ICD-10-CM | POA: Diagnosis not present

## 2016-05-14 DIAGNOSIS — Z79899 Other long term (current) drug therapy: Secondary | ICD-10-CM | POA: Diagnosis not present

## 2016-05-14 LAB — TROPONIN I

## 2016-05-14 LAB — URINALYSIS, ROUTINE W REFLEX MICROSCOPIC
BILIRUBIN URINE: NEGATIVE
Glucose, UA: NEGATIVE mg/dL
Ketones, ur: 15 mg/dL — AB
Leukocytes, UA: NEGATIVE
Nitrite: NEGATIVE
Protein, ur: >300 mg/dL — AB
SPECIFIC GRAVITY, URINE: 1.02 (ref 1.005–1.030)
pH: 6 (ref 5.0–8.0)

## 2016-05-14 LAB — COMPREHENSIVE METABOLIC PANEL
ALK PHOS: 85 U/L (ref 38–126)
ALT: 17 U/L (ref 17–63)
AST: 23 U/L (ref 15–41)
Albumin: 4.4 g/dL (ref 3.5–5.0)
Anion gap: 7 (ref 5–15)
BILIRUBIN TOTAL: 0.8 mg/dL (ref 0.3–1.2)
BUN: 14 mg/dL (ref 6–20)
CALCIUM: 9.5 mg/dL (ref 8.9–10.3)
CO2: 25 mmol/L (ref 22–32)
CREATININE: 1.03 mg/dL (ref 0.61–1.24)
Chloride: 103 mmol/L (ref 101–111)
Glucose, Bld: 114 mg/dL — ABNORMAL HIGH (ref 65–99)
Potassium: 3.8 mmol/L (ref 3.5–5.1)
Sodium: 135 mmol/L (ref 135–145)
TOTAL PROTEIN: 8.5 g/dL — AB (ref 6.5–8.1)

## 2016-05-14 LAB — RAPID URINE DRUG SCREEN, HOSP PERFORMED
AMPHETAMINES: NOT DETECTED
Barbiturates: NOT DETECTED
Benzodiazepines: NOT DETECTED
Cocaine: NOT DETECTED
Opiates: POSITIVE — AB
Tetrahydrocannabinol: POSITIVE — AB

## 2016-05-14 LAB — CBC WITH DIFFERENTIAL/PLATELET
Basophils Absolute: 0 10*3/uL (ref 0.0–0.1)
Basophils Relative: 0 %
Eosinophils Absolute: 0 10*3/uL (ref 0.0–0.7)
Eosinophils Relative: 0 %
HCT: 36.5 % — ABNORMAL LOW (ref 39.0–52.0)
HEMOGLOBIN: 12.4 g/dL — AB (ref 13.0–17.0)
LYMPHS ABS: 1.9 10*3/uL (ref 0.7–4.0)
LYMPHS PCT: 18 %
MCH: 30.2 pg (ref 26.0–34.0)
MCHC: 34 g/dL (ref 30.0–36.0)
MCV: 89 fL (ref 78.0–100.0)
MONOS PCT: 2 %
Monocytes Absolute: 0.2 10*3/uL (ref 0.1–1.0)
NEUTROS PCT: 80 %
Neutro Abs: 8.1 10*3/uL — ABNORMAL HIGH (ref 1.7–7.7)
Platelets: 279 10*3/uL (ref 150–400)
RBC: 4.1 MIL/uL — AB (ref 4.22–5.81)
RDW: 13.8 % (ref 11.5–15.5)
WBC: 10.2 10*3/uL (ref 4.0–10.5)

## 2016-05-14 LAB — CBG MONITORING, ED: Glucose-Capillary: 117 mg/dL — ABNORMAL HIGH (ref 65–99)

## 2016-05-14 LAB — URINE MICROSCOPIC-ADD ON

## 2016-05-14 LAB — LIPASE, BLOOD: LIPASE: 18 U/L (ref 11–51)

## 2016-05-14 MED ORDER — GI COCKTAIL ~~LOC~~
30.0000 mL | Freq: Once | ORAL | Status: AC
  Administered 2016-05-14: 30 mL via ORAL
  Filled 2016-05-14: qty 30

## 2016-05-14 MED ORDER — ONDANSETRON 4 MG PO TBDP: 4.0000 mg | ORAL_TABLET | Freq: Three times a day (TID) | ORAL | 0 refills | Status: DC | PRN

## 2016-05-14 MED ORDER — PANTOPRAZOLE SODIUM 40 MG PO TBEC
40.0000 mg | DELAYED_RELEASE_TABLET | Freq: Once | ORAL | Status: AC
  Administered 2016-05-14: 40 mg via ORAL
  Filled 2016-05-14: qty 1

## 2016-05-14 MED ORDER — SODIUM CHLORIDE 0.9 % IV BOLUS (SEPSIS)
1000.0000 mL | Freq: Once | INTRAVENOUS | Status: AC
  Administered 2016-05-14: 1000 mL via INTRAVENOUS

## 2016-05-14 MED ORDER — MORPHINE SULFATE (PF) 4 MG/ML IV SOLN
4.0000 mg | Freq: Once | INTRAVENOUS | Status: AC
  Administered 2016-05-14: 4 mg via INTRAVENOUS
  Filled 2016-05-14: qty 1

## 2016-05-14 MED ORDER — AMLODIPINE BESYLATE 5 MG PO TABS
5.0000 mg | ORAL_TABLET | Freq: Once | ORAL | Status: AC
  Administered 2016-05-14: 5 mg via ORAL
  Filled 2016-05-14: qty 1

## 2016-05-14 MED ORDER — ONDANSETRON HCL 4 MG/2ML IJ SOLN
4.0000 mg | Freq: Once | INTRAMUSCULAR | Status: AC
  Administered 2016-05-14: 4 mg via INTRAVENOUS
  Filled 2016-05-14: qty 2

## 2016-05-14 MED ORDER — LISINOPRIL 10 MG PO TABS
10.0000 mg | ORAL_TABLET | Freq: Once | ORAL | Status: AC
  Administered 2016-05-14: 10 mg via ORAL
  Filled 2016-05-14: qty 1

## 2016-05-14 NOTE — ED Notes (Signed)
Pt given aprox 30 cc water to drink,  Pt did not vomit after drinking

## 2016-05-14 NOTE — ED Notes (Signed)
Pt states he is unable to urinate.

## 2016-05-14 NOTE — ED Provider Notes (Signed)
TIME SEEN: 1:10 AM  CHIEF COMPLAINT: nausea and vomiting  HPI: Pt is a 30 y.o. male with history of insulin-dependent diabetes, hypertension, hyperlipidemia, medical noncompliance who presents to the emergency department with nausea, vomiting that started yesterday. Complains of a burning pain throughout his abdomen that goes into his chest. Has this previously with his episodes of DKA. He was concerned he could be in DKA today. Has not been checking his blood sugar because he does not have a glucometer. States he has been taking his medications as prescribed including his hypertensive medications but is very hypertensive today. No headache, vision changes, numbness, weakness, shortness of breath. No diarrhea. No bloody stools or melena. No dysuria, penile discharge, testicular swelling. No history of abdominal surgery.  ROS: See HPI Constitutional: no fever  Eyes: no drainage  ENT: no runny nose   Cardiovascular:   chest pain  Resp: no SOB  GI:  vomiting GU: no dysuria Integumentary: no rash  Allergy: no hives  Musculoskeletal: no leg swelling  Neurological: no slurred speech ROS otherwise negative  PAST MEDICAL HISTORY/PAST SURGICAL HISTORY:  Past Medical History:  Diagnosis Date  . Diabetes mellitus    lantus/novolog  . Hyperlipidemia   . Hypertension   . Marijuana abuse    occaisionally  . Noncompliance with medication regimen   . Tobacco abuse    5/day    MEDICATIONS:  Prior to Admission medications   Medication Sig Start Date End Date Taking? Authorizing Provider  amLODipine (NORVASC) 5 MG tablet Take 1 tablet (5 mg total) by mouth daily. 04/17/16  Yes [name], MD  LEVEMIR FLEXTOUCH 100 UNIT/ML Pen INJECT 30 UNITS SUBCUTNEOUSLY A.C. Bid , Breakfast and supper. 04/17/16  Yes [name], MD  lisinopril (PRINIVIL,ZESTRIL) 10 MG tablet Take 1 tablet (10 mg total) by mouth daily. 04/04/15  Yes [name], MD  pantoprazole (PROTONIX) 40 MG tablet Take 1  tablet (40 mg total) by mouth daily. 04/17/16  Yes [name], MD  pravastatin (PRAVACHOL) 40 MG tablet Take 1 tablet (40 mg total) by mouth daily at 6 PM. 06/10/15  Yes [name], MD    ALLERGIES:  Allergies  Allergen Reactions  . Orange     Increases blood sugar immediately     SOCIAL HISTORY:  Social History  Substance Use Topics  . Smoking status: Current Every Day Smoker    Packs/day: 1.00    Years: 8.00    Types: Cigarettes  . Smokeless tobacco: Never Used  . Alcohol use No    FAMILY HISTORY: Family History  Problem Relation Age of Onset  . Diabetes Father   . Kidney failure Father     last 4 mnths of life  . Hypertension Mother     EXAM: BP (!) 195/114 (BP Location: Left Arm)   Pulse 93   Temp 98.2 F (36.8 C) (Oral)   Resp 16   Ht 6\' 1"  (1.854 m)   Wt 150 lb (68 kg)   SpO2 99%   BMI 19.79 kg/m  CONSTITUTIONAL: Alert and oriented and responds appropriately to questions. Chronically ill-appearing, in no severe distress, afebrile HEAD: Normocephalic EYES: Conjunctivae clear, PERRL ENT: normal nose; no rhinorrhea; dry mucous membranes NECK: Supple, no meningismus, no LAD  CARD: RRR; S1 and S2 appreciated; no murmurs, no clicks, no rubs, no gallops RESP: Normal chest excursion without splinting or tachypnea; breath sounds clear and equal bilaterally; no wheezes, no rhonchi, no rales, no hypoxia or respiratory distress, speaking full sentences ABD/GI: Normal  bowel sounds; non-distended; soft, mildly tender to palpation diffusely, no pulsatile mass, no rebound, no guarding, no peritoneal signs BACK:  The back appears normal and is non-tender to palpation, there is no CVA tenderness EXT: Normal ROM in all joints; non-tender to palpation; no edema; normal capillary refill; no cyanosis, no calf tenderness or swelling    SKIN: Normal color for age and race; warm; no rash NEURO: Moves all extremities equally, sensation to light touch intact diffusely,  cranial nerves II through XII intact PSYCH: The patient's mood and manner are appropriate. Grooming and personal hygiene are appropriate.  MEDICAL DECISION MAKING: Patient here with vomiting, burning abdominal pain and chest pain. States he felt like his pain started after certain episodes of emesis, retching. Felt like he was in DKA. Blood sugar in the 110s. Will check labs, urine. EKG shows no new ischemic abnormality.  Doubt ACS, PE, dissection. Will treat symptoms with morphine, Zofran, Protonix, GI cocktail. When he is able to tolerate fluids, we will give him his home blood pressure medications. He is not sure if he has been able to keep them down given his vomiting today.  Nothing to suggest appendicitis, diverticulitis, colitis, bowel obstruction. Doubt surgical pathology present. I do not feel this time he needs emergent abdominal imaging.   ED PROGRESS: Patient does not appear to be in DKA. No leukocytosis, fever. Normal LFTs, creatinine and lipase. Reports symptoms improving with above medications. We'll fluid challenge patient. Urinalysis pending.   4:00 AM  Pt continues to have hypertension which looks like this is something that fluctuates for patient and he does have a document history of medical noncompliance. Denies any chest pain, back pain, headache, numbness or weakness, vision changes. He is drinking without difficulty and no vomiting in the ED. Abdominal exam is still benign. X-ray shows normal chest and abdomen. No free air or bowel obstruction. No infiltrate, edema or pneumothorax. Have recommended he continue his medications at home. I feel he can be discharged with outpatient follow-up and close monitoring of his blood pressure. He states he has plenty of his amlodipine and lisinopril at home. Patient and mother are comfortable with this plan. We'll discharge with prescription for Zofran. Suspect vomiting may be from viral illness, chronic marijuana use. Suspect a burning upper  abdominal pain, lower chest pain was from acid reflux. Improved with GI cocktail and Protonix. He is on a PPI at home. No hematemesis, hematochezia, melena.   At this time, I do not feel there is any life-threatening condition present. I have reviewed and discussed all results (EKG, imaging, lab, urine as appropriate), exam findings with patient/family. I have reviewed nursing notes and appropriate previous records.  I feel the patient is safe to be discharged home without further emergent workup and can continue workup as an outpatient as needed. Discussed usual and customary return precautions. Patient/family verbalize understanding and are comfortable with this plan.  Outpatient follow-up has been provided. All questions have been answered.    EKG Interpretation  Date/Time:  Friday May 14 2016 01:22:42 EDT Ventricular Rate:  86 PR Interval:    QRS Duration: 83 QT Interval:  334 QTC Calculation: 400 R Axis:   82 Text Interpretation:  Sinus rhythm ST elev, probable normal early repol pattern No significant change since last tracing Confirmed by Takeira Yanes,  DO, Chrisotpher Rivero [phone]) on 05/14/2016 1:46:36 AM         [place], DO 05/14/16 [id number]

## 2016-05-14 NOTE — ED Triage Notes (Signed)
Pt is diabetic, states he has not been able to check his blood sugars since he got a new meter last week.  Pt has been vomiting all day and c/o abd pain.

## 2016-07-19 ENCOUNTER — Inpatient Hospital Stay (HOSPITAL_COMMUNITY)
Admission: EM | Admit: 2016-07-19 | Discharge: 2016-07-22 | DRG: 638 | Disposition: A | Discharge status: Home / Self Care | Payer: Medicare Other | Attending: Family Medicine | Admitting: Family Medicine

## 2016-07-19 ENCOUNTER — Observation Stay (HOSPITAL_COMMUNITY): Payer: Medicare Other

## 2016-07-19 ENCOUNTER — Encounter (HOSPITAL_COMMUNITY): Payer: Self-pay

## 2016-07-19 DIAGNOSIS — K3184 Gastroparesis: Secondary | ICD-10-CM | POA: Diagnosis present

## 2016-07-19 DIAGNOSIS — R109 Unspecified abdominal pain: Secondary | ICD-10-CM | POA: Diagnosis not present

## 2016-07-19 DIAGNOSIS — E785 Hyperlipidemia, unspecified: Secondary | ICD-10-CM | POA: Diagnosis present

## 2016-07-19 DIAGNOSIS — E108 Type 1 diabetes mellitus with unspecified complications: Secondary | ICD-10-CM | POA: Diagnosis present

## 2016-07-19 DIAGNOSIS — Z9114 Patient's other noncompliance with medication regimen: Secondary | ICD-10-CM

## 2016-07-19 DIAGNOSIS — T383X6A Underdosing of insulin and oral hypoglycemic [antidiabetic] drugs, initial encounter: Secondary | ICD-10-CM | POA: Diagnosis present

## 2016-07-19 DIAGNOSIS — Z833 Family history of diabetes mellitus: Secondary | ICD-10-CM

## 2016-07-19 DIAGNOSIS — E1043 Type 1 diabetes mellitus with diabetic autonomic (poly)neuropathy: Secondary | ICD-10-CM | POA: Diagnosis present

## 2016-07-19 DIAGNOSIS — D72829 Elevated white blood cell count, unspecified: Secondary | ICD-10-CM

## 2016-07-19 DIAGNOSIS — R17 Unspecified jaundice: Secondary | ICD-10-CM | POA: Diagnosis present

## 2016-07-19 DIAGNOSIS — Z91148 Patient's other noncompliance with medication regimen for other reason: Secondary | ICD-10-CM

## 2016-07-19 DIAGNOSIS — N289 Disorder of kidney and ureter, unspecified: Secondary | ICD-10-CM

## 2016-07-19 DIAGNOSIS — E1010 Type 1 diabetes mellitus with ketoacidosis without coma: Secondary | ICD-10-CM | POA: Diagnosis not present

## 2016-07-19 DIAGNOSIS — F502 Bulimia nervosa, unspecified: Secondary | ICD-10-CM

## 2016-07-19 DIAGNOSIS — E10649 Type 1 diabetes mellitus with hypoglycemia without coma: Secondary | ICD-10-CM | POA: Diagnosis not present

## 2016-07-19 DIAGNOSIS — D7219 Other eosinophilia: Secondary | ICD-10-CM

## 2016-07-19 DIAGNOSIS — Z794 Long term (current) use of insulin: Secondary | ICD-10-CM | POA: Diagnosis not present

## 2016-07-19 DIAGNOSIS — Z91128 Patient's intentional underdosing of medication regimen for other reason: Secondary | ICD-10-CM

## 2016-07-19 DIAGNOSIS — E1310 Other specified diabetes mellitus with ketoacidosis without coma: Secondary | ICD-10-CM

## 2016-07-19 DIAGNOSIS — E860 Dehydration: Secondary | ICD-10-CM

## 2016-07-19 DIAGNOSIS — N179 Acute kidney failure, unspecified: Secondary | ICD-10-CM | POA: Diagnosis present

## 2016-07-19 DIAGNOSIS — E1110 Type 2 diabetes mellitus with ketoacidosis without coma: Secondary | ICD-10-CM | POA: Diagnosis present

## 2016-07-19 DIAGNOSIS — I10 Essential (primary) hypertension: Secondary | ICD-10-CM | POA: Diagnosis present

## 2016-07-19 DIAGNOSIS — E871 Hypo-osmolality and hyponatremia: Secondary | ICD-10-CM | POA: Diagnosis present

## 2016-07-19 DIAGNOSIS — F17210 Nicotine dependence, cigarettes, uncomplicated: Secondary | ICD-10-CM | POA: Diagnosis present

## 2016-07-19 DIAGNOSIS — D721 Eosinophilia: Secondary | ICD-10-CM

## 2016-07-19 DIAGNOSIS — R112 Nausea with vomiting, unspecified: Secondary | ICD-10-CM | POA: Diagnosis present

## 2016-07-19 LAB — GLUCOSE, CAPILLARY
GLUCOSE-CAPILLARY: 389 mg/dL — AB (ref 65–99)
GLUCOSE-CAPILLARY: 232 mg/dL — AB (ref 65–99)
GLUCOSE-CAPILLARY: 202 mg/dL — AB (ref 65–99)
GLUCOSE-CAPILLARY: 107 mg/dL — AB (ref 65–99)
GLUCOSE-CAPILLARY: 158 mg/dL — AB (ref 65–99)
GLUCOSE-CAPILLARY: 124 mg/dL — AB (ref 65–99)
GLUCOSE-CAPILLARY: 152 mg/dL — AB (ref 65–99)
GLUCOSE-CAPILLARY: 85 mg/dL (ref 65–99)
Glucose-Capillary: 315 mg/dL — ABNORMAL HIGH (ref 65–99)
Glucose-Capillary: 210 mg/dL — ABNORMAL HIGH (ref 65–99)
Glucose-Capillary: 234 mg/dL — ABNORMAL HIGH (ref 65–99)
Glucose-Capillary: 114 mg/dL — ABNORMAL HIGH (ref 65–99)
Glucose-Capillary: 141 mg/dL — ABNORMAL HIGH (ref 65–99)
Glucose-Capillary: 101 mg/dL — ABNORMAL HIGH (ref 65–99)
Glucose-Capillary: 139 mg/dL — ABNORMAL HIGH (ref 65–99)

## 2016-07-19 LAB — BASIC METABOLIC PANEL
ANION GAP: 20 — AB (ref 5–15)
Anion gap: 18 — ABNORMAL HIGH (ref 5–15)
Anion gap: 13 (ref 5–15)
Anion gap: 12 (ref 5–15)
Anion gap: 11 (ref 5–15)
BUN: 27 mg/dL — ABNORMAL HIGH (ref 6–20)
BUN: 25 mg/dL — AB (ref 6–20)
BUN: 23 mg/dL — AB (ref 6–20)
BUN: 22 mg/dL — AB (ref 6–20)
BUN: 21 mg/dL — AB (ref 6–20)
CALCIUM: 9.4 mg/dL (ref 8.9–10.3)
CHLORIDE: 101 mmol/L (ref 101–111)
CHLORIDE: 106 mmol/L (ref 101–111)
CHLORIDE: 104 mmol/L (ref 101–111)
CHLORIDE: 104 mmol/L (ref 101–111)
CO2: 14 mmol/L — ABNORMAL LOW (ref 22–32)
CO2: 18 mmol/L — AB (ref 22–32)
CO2: 20 mmol/L — AB (ref 22–32)
CO2: 20 mmol/L — AB (ref 22–32)
CO2: 20 mmol/L — AB (ref 22–32)
CREATININE: 1.77 mg/dL — AB (ref 0.61–1.24)
CREATININE: 1.53 mg/dL — AB (ref 0.61–1.24)
CREATININE: 1.61 mg/dL — AB (ref 0.61–1.24)
CREATININE: 1.54 mg/dL — AB (ref 0.61–1.24)
Calcium: 9.7 mg/dL (ref 8.9–10.3)
Calcium: 9.3 mg/dL (ref 8.9–10.3)
Calcium: 8.9 mg/dL (ref 8.9–10.3)
Calcium: 8.9 mg/dL (ref 8.9–10.3)
Chloride: 102 mmol/L (ref 101–111)
Creatinine, Ser: 1.8 mg/dL — ABNORMAL HIGH (ref 0.61–1.24)
GFR calc Af Amer: 58 mL/min — ABNORMAL LOW (ref >60)
GFR calc Af Amer: >60 mL/min (ref >60)
GFR calc Af Amer: >60 mL/min (ref >60)
GFR calc non Af Amer: 50 mL/min — ABNORMAL LOW (ref >60)
GFR calc non Af Amer: 60 mL/min — ABNORMAL LOW (ref >60)
GFR calc non Af Amer: 56 mL/min — ABNORMAL LOW (ref >60)
GFR calc non Af Amer: 60 mL/min — ABNORMAL LOW (ref >60)
GFR, EST AFRICAN AMERICAN: 57 mL/min — AB (ref >60)
GFR, EST NON AFRICAN AMERICAN: 49 mL/min — AB (ref >60)
GLUCOSE: 338 mg/dL — AB (ref 65–99)
GLUCOSE: 251 mg/dL — AB (ref 65–99)
Glucose, Bld: 122 mg/dL — ABNORMAL HIGH (ref 65–99)
Glucose, Bld: 134 mg/dL — ABNORMAL HIGH (ref 65–99)
Glucose, Bld: 131 mg/dL — ABNORMAL HIGH (ref 65–99)
POTASSIUM: 4.5 mmol/L (ref 3.5–5.1)
POTASSIUM: 4.9 mmol/L (ref 3.5–5.1)
POTASSIUM: 4.6 mmol/L (ref 3.5–5.1)
POTASSIUM: 4.6 mmol/L (ref 3.5–5.1)
POTASSIUM: 4.2 mmol/L (ref 3.5–5.1)
SODIUM: 135 mmol/L (ref 135–145)
Sodium: 136 mmol/L (ref 135–145)
Sodium: 137 mmol/L (ref 135–145)
Sodium: 139 mmol/L (ref 135–145)
Sodium: 136 mmol/L (ref 135–145)

## 2016-07-19 LAB — URINALYSIS, ROUTINE W REFLEX MICROSCOPIC
Bilirubin Urine: NEGATIVE
GLUCOSE, UA: 500 mg/dL — AB
Ketones, ur: >80 mg/dL — AB
LEUKOCYTES UA: NEGATIVE
Nitrite: NEGATIVE
PH: 6 (ref 5.0–8.0)
PROTEIN: 100 mg/dL — AB
Specific Gravity, Urine: 1.025 (ref 1.005–1.030)

## 2016-07-19 LAB — COMPREHENSIVE METABOLIC PANEL
ALBUMIN: 4.5 g/dL (ref 3.5–5.0)
ALT: 19 U/L (ref 17–63)
ANION GAP: 24 — AB (ref 5–15)
Alkaline Phosphatase: 94 U/L (ref 38–126)
BUN: 30 mg/dL — AB (ref 6–20)
CHLORIDE: 94 mmol/L — AB (ref 101–111)
CO2: 15 mmol/L — ABNORMAL LOW (ref 22–32)
Calcium: 10 mg/dL (ref 8.9–10.3)
Creatinine, Ser: 1.97 mg/dL — ABNORMAL HIGH (ref 0.61–1.24)
GFR calc Af Amer: 51 mL/min — ABNORMAL LOW (ref >60)
GFR calc non Af Amer: 44 mL/min — ABNORMAL LOW (ref >60)
GLUCOSE: 464 mg/dL — AB (ref 65–99)
POTASSIUM: 4.7 mmol/L (ref 3.5–5.1)
Sodium: 133 mmol/L — ABNORMAL LOW (ref 135–145)
Total Bilirubin: 2.3 mg/dL — ABNORMAL HIGH (ref 0.3–1.2)
Total Protein: 8.9 g/dL — ABNORMAL HIGH (ref 6.5–8.1)

## 2016-07-19 LAB — CBC WITH DIFFERENTIAL/PLATELET
BASOS ABS: 0.1 10*3/uL (ref 0.0–0.1)
Basophils Relative: 1 %
EOS PCT: 0 %
Eosinophils Absolute: 0 10*3/uL (ref 0.0–0.7)
HEMATOCRIT: 40.5 % (ref 39.0–52.0)
Hemoglobin: 14.1 g/dL (ref 13.0–17.0)
LYMPHS ABS: 3.1 10*3/uL (ref 0.7–4.0)
LYMPHS PCT: 22 %
MCH: 30.8 pg (ref 26.0–34.0)
MCHC: 34.8 g/dL (ref 30.0–36.0)
MCV: 88.4 fL (ref 78.0–100.0)
MONO ABS: 0.3 10*3/uL (ref 0.1–1.0)
Monocytes Relative: 2 %
NEUTROS ABS: 10.5 10*3/uL — AB (ref 1.7–7.7)
Neutrophils Relative %: 75 %
PLATELETS: 275 10*3/uL (ref 150–400)
RBC: 4.58 MIL/uL (ref 4.22–5.81)
RDW: 13.2 % (ref 11.5–15.5)
WBC: 13.9 10*3/uL — AB (ref 4.0–10.5)

## 2016-07-19 LAB — URINE MICROSCOPIC-ADD ON

## 2016-07-19 LAB — CBG MONITORING, ED
Glucose-Capillary: 432 mg/dL — ABNORMAL HIGH (ref 65–99)
Glucose-Capillary: 474 mg/dL — ABNORMAL HIGH (ref 65–99)

## 2016-07-19 LAB — MRSA PCR SCREENING: MRSA by PCR: NEGATIVE

## 2016-07-19 LAB — LIPASE, BLOOD: LIPASE: 12 U/L (ref 11–51)

## 2016-07-19 MED ORDER — AMLODIPINE BESYLATE 5 MG PO TABS
5.0000 mg | ORAL_TABLET | Freq: Every day | ORAL | Status: DC
  Administered 2016-07-20 – 2016-07-22 (×3): 5 mg via ORAL
  Filled 2016-07-19 (×4): qty 1

## 2016-07-19 MED ORDER — SODIUM CHLORIDE 0.9 % IV BOLUS (SEPSIS)
1000.0000 mL | Freq: Once | INTRAVENOUS | Status: AC
  Administered 2016-07-19: 1000 mL via INTRAVENOUS

## 2016-07-19 MED ORDER — HYDRALAZINE HCL 20 MG/ML IJ SOLN
10.0000 mg | INTRAMUSCULAR | Status: DC | PRN
  Administered 2016-07-19 (×2): 10 mg via INTRAVENOUS
  Filled 2016-07-19 (×2): qty 1

## 2016-07-19 MED ORDER — ONDANSETRON HCL 4 MG/2ML IJ SOLN
4.0000 mg | Freq: Once | INTRAMUSCULAR | Status: AC
  Administered 2016-07-19: 4 mg via INTRAVENOUS
  Filled 2016-07-19: qty 2

## 2016-07-19 MED ORDER — SODIUM CHLORIDE 0.9 % IV SOLN
INTRAVENOUS | Status: AC
  Filled 2016-07-19: qty 2.5

## 2016-07-19 MED ORDER — ACETAMINOPHEN 325 MG PO TABS: 650.0000 mg | ORAL_TABLET | Freq: Four times a day (QID) | ORAL | Status: DC | PRN

## 2016-07-19 MED ORDER — INSULIN ASPART 100 UNIT/ML ~~LOC~~ SOLN
0.0000 [IU] | Freq: Three times a day (TID) | SUBCUTANEOUS | Status: DC
  Administered 2016-07-19: 1 [IU] via SUBCUTANEOUS
  Administered 2016-07-20: 9 [IU] via SUBCUTANEOUS
  Administered 2016-07-21: 3 [IU] via SUBCUTANEOUS
  Administered 2016-07-21: 1 [IU] via SUBCUTANEOUS
  Administered 2016-07-22: 3 [IU] via SUBCUTANEOUS

## 2016-07-19 MED ORDER — PRAVASTATIN SODIUM 40 MG PO TABS
40.0000 mg | ORAL_TABLET | Freq: Every day | ORAL | Status: DC
  Administered 2016-07-19 – 2016-07-21 (×3): 40 mg via ORAL
  Filled 2016-07-19 (×3): qty 1

## 2016-07-19 MED ORDER — DEXTROSE-NACL 5-0.45 % IV SOLN
INTRAVENOUS | Status: DC
  Administered 2016-07-19: 05:00:00 via INTRAVENOUS

## 2016-07-19 MED ORDER — INSULIN GLARGINE 100 UNIT/ML ~~LOC~~ SOLN
10.0000 [IU] | Freq: Once | SUBCUTANEOUS | Status: AC
  Administered 2016-07-19: 10 [IU] via SUBCUTANEOUS
  Filled 2016-07-19: qty 0.1

## 2016-07-19 MED ORDER — HEPARIN SODIUM (PORCINE) 5000 UNIT/ML IJ SOLN
5000.0000 [IU] | Freq: Three times a day (TID) | INTRAMUSCULAR | Status: DC
  Administered 2016-07-19 – 2016-07-22 (×9): 5000 [IU] via SUBCUTANEOUS
  Filled 2016-07-19 (×9): qty 1

## 2016-07-19 MED ORDER — SODIUM CHLORIDE 0.9 % IV SOLN
INTRAVENOUS | Status: DC
  Administered 2016-07-19: 2.8 [IU]/h via INTRAVENOUS
  Filled 2016-07-19: qty 2.5

## 2016-07-19 MED ORDER — SODIUM CHLORIDE 0.9 % IV BOLUS (SEPSIS): 1000.0000 mL | Freq: Once | INTRAVENOUS | Status: DC

## 2016-07-19 MED ORDER — FAMOTIDINE IN NACL 20-0.9 MG/50ML-% IV SOLN
20.0000 mg | Freq: Two times a day (BID) | INTRAVENOUS | Status: DC
  Administered 2016-07-19 – 2016-07-20 (×4): 20 mg via INTRAVENOUS
  Filled 2016-07-19 (×4): qty 50

## 2016-07-19 MED ORDER — SODIUM CHLORIDE 0.9 % IV SOLN
INTRAVENOUS | Status: DC
  Administered 2016-07-19: 03:00:00 via INTRAVENOUS
  Filled 2016-07-19: qty 2.5

## 2016-07-19 MED ORDER — SODIUM CHLORIDE 0.9 % IV SOLN
INTRAVENOUS | Status: DC
  Administered 2016-07-19 – 2016-07-22 (×5): via INTRAVENOUS

## 2016-07-19 MED ORDER — ONDANSETRON HCL 4 MG/2ML IJ SOLN
4.0000 mg | Freq: Four times a day (QID) | INTRAMUSCULAR | Status: DC | PRN
  Administered 2016-07-19: 4 mg via INTRAVENOUS
  Filled 2016-07-19: qty 2

## 2016-07-19 MED ORDER — POTASSIUM CHLORIDE 10 MEQ/100ML IV SOLN
10.0000 meq | INTRAVENOUS | Status: AC
  Administered 2016-07-19 (×2): 10 meq via INTRAVENOUS
  Filled 2016-07-19: qty 100

## 2016-07-19 NOTE — ED Notes (Signed)
Pt encouraged to urinate

## 2016-07-19 NOTE — ED Notes (Signed)
Blood glucose 435

## 2016-07-19 NOTE — ED Notes (Signed)
Call to Tim re insulin drip

## 2016-07-19 NOTE — ED Notes (Signed)
Report to Robbie, RN

## 2016-07-19 NOTE — ED Notes (Signed)
Pt is a noncompliant diabetic per his mother who "knows his body" and fails to follow his treatment regime- She reports he is hospitalized 3-4 times annually for diabetic issues. He presents diaphoretic and drinking for a regular ginger ale bottle in spite of the EDp Dr Tomi Bamberger instructing him not to drink. He has had two soft formed stools and has been cleansed and assisted with clean linens. He in spite of unable or unwilling to assist in his bed change has gotten out of bed and has turned on the spigot and is drinking from it. He is encouraged to help the staff help him by participation in his care while in the ED. He has scarring to his anticubes and states that the scars are from previous admissions and that he does not use IV drugs. His mother is at the bedside and reports that his father behaved the same way with diabetes and died "very young".

## 2016-07-19 NOTE — ED Triage Notes (Signed)
Patient complaining of abdominal pain that started yesterday.  Also complaining of nausea and vomiting.  Has a history of the same.

## 2016-07-19 NOTE — ED Provider Notes (Signed)
[facility] DEPT Provider Note   CSN: [id number] Arrival date & time: 07/19/16  0009 By signing my name below, I, Nathaniel [name], attest that this documentation has been prepared under the direction and in the presence of Nathaniel Gaskins, MD . Electronically Signed: Dyke [name], Scribe. 07/19/2016. 12:31 AM.   Time seen 12:20 AM  History   Chief Complaint Chief Complaint  Patient presents with  . Abdominal Pain   Level V Caveat: Need for urgent intervention  HPI Nathaniel [name] is a 30 y.o. male with hx of DM, HTN, and HLD who presents to the Emergency Department complaining of diffuse abdominal pain onset yesterday morning, October 29. He notes associated nausea and vomiting x6, increased thirst, urinary frequency, and SOB. Pt last vomited 1 hour ago. No alleviating or modifying factors noted. He states he has not taken his medication today. Mother states he was diagnosed with diabetes at 32. Pt does not follow his diet or regularly check his blood sugar. Pt is followed by Nathaniel [name]. He is a smoker, but denies any alcohol use.  Pt denies diarrhea.   The history is provided by the patient. No language interpreter was used.   PCP Dr [name]  Past Medical History:  Diagnosis Date  . Diabetes mellitus    lantus/novolog  . Hyperlipidemia   . Hypertension   . Marijuana abuse    occaisionally  . Noncompliance with medication regimen   . Tobacco abuse    5/day    Patient Active Problem List   Diagnosis Date Noted  . Hyponatremia 04/15/2016  . Type 1 diabetes mellitus with hyperosmolarity without nonketotic hyperglycemic hyperosmolar coma ([place]) 04/15/2016  . Hyperglycemia 04/15/2016  . AKI (acute kidney injury) ([place]) 01/04/2016  . Severe uncontrolled hypertension 01/04/2016  . Noncompliance with medications 01/04/2016  . DKA (diabetic ketoacidoses) ([place]) 06/07/2015  . Intractable nausea and vomiting 04/01/2015  . Gastroparesis 04/01/2015  . Type 1 diabetes  mellitus with complication ([place]) 48/09/6551  . Chronic hypertension   . Nausea with vomiting   . Abscess, gluteal, right 06/21/2014  . Nausea and vomiting 04/08/2013  . Hyperglycemia without ketosis 04/08/2013  . Essential hypertension, benign 04/08/2013  . Hypercalcemia 07/29/2011  . Vomiting 07/28/2011  . Leukocytosis 07/28/2011  . Hypokalemia 07/28/2011  . DKA, type 1 ([place]) 07/27/2011  . Dehydration 07/27/2011  . Tobacco abuse 07/27/2011  . Marijuana abuse 07/27/2011    Past Surgical History:  Procedure Laterality Date  . EYE SURGERY       Home Medications    Prior to Admission medications   Medication Sig Start Date End Date Taking? Authorizing Provider  amLODipine (NORVASC) 5 MG tablet Take 1 tablet (5 mg total) by mouth daily. 04/17/16  Yes Nathaniel Gaskins, MD  LEVEMIR FLEXTOUCH 100 UNIT/ML Pen INJECT 30 UNITS SUBCUTNEOUSLY A.C. Bid , Breakfast and supper. 04/17/16  Yes Nathaniel Gaskins, MD  lisinopril (PRINIVIL,ZESTRIL) 10 MG tablet Take 1 tablet (10 mg total) by mouth daily. 04/04/15  Yes Nathaniel Gaskins, MD  ondansetron (ZOFRAN ODT) 4 MG disintegrating tablet Take 1 tablet (4 mg total) by mouth every 8 (eight) hours as needed for nausea or vomiting. 05/14/16  Yes Kristen N Ward, DO  pantoprazole (PROTONIX) 40 MG tablet Take 1 tablet (40 mg total) by mouth daily. 04/17/16  Yes Nathaniel Gaskins, MD  pravastatin (PRAVACHOL) 40 MG tablet Take 1 tablet (40 mg total) by mouth daily at 6 PM. 06/10/15  Yes Nathaniel Gaskins, MD    Family History Family History  Problem  Relation Age of Onset  . Diabetes Father   . Kidney failure Father     last 4 mnths of life  . Hypertension Mother     Social History Social History  Substance Use Topics  . Smoking status: Current Every Day Smoker    Packs/day: 1.00    Years: 8.00    Types: Cigarettes  . Smokeless tobacco: Never Used  . Alcohol use No  lives with mother   Allergies   Orange   Review of Systems Review of Systems   Gastrointestinal: Positive for abdominal pain, nausea and vomiting. Negative for diarrhea.  Genitourinary: Positive for frequency.  All other systems reviewed and are negative.   Physical Exam Updated Vital Signs BP (!) 183/119 (BP Location: Right Arm)   Pulse 81   Temp 98.8 F (37.1 C) (Oral)   Resp 20   Ht 6\' 1"  (1.854 m)   Wt 159 lb (72.1 kg)   SpO2 100%   BMI 20.98 kg/m   Vital signs normal except for hypertension   Physical Exam  Constitutional: He is oriented to person, place, and time. He appears well-developed and well-nourished.  Non-toxic appearance. He does not appear ill. No distress.  HENT:  Head: Normocephalic and atraumatic.  Right Ear: External ear normal.  Left Ear: External ear normal.  Nose: Nose normal. No mucosal edema or rhinorrhea.  Mouth/Throat: Mucous membranes are normal. No dental abscesses or uvula swelling.  Dry mucus membranes  Eyes: Conjunctivae and EOM are normal. Pupils are equal, round, and reactive to light.  Neck: Normal range of motion and full passive range of motion without pain. Neck supple.  Cardiovascular: Normal rate, regular rhythm and normal heart sounds.  Exam reveals no gallop and no friction rub.   No murmur heard. Pulmonary/Chest: Effort normal and breath sounds normal. No respiratory distress. He has no wheezes. He has no rhonchi. He has no rales. He exhibits no tenderness and no crepitus.  Abdominal: Soft. Normal appearance and bowel sounds are normal. He exhibits no distension. There is no tenderness. There is no rebound and no guarding.  Musculoskeletal: Normal range of motion. He exhibits no edema or tenderness.  Neurological: He is alert and oriented to person, place, and time. He has normal strength. No cranial nerve deficit.  Skin: Skin is warm, dry and intact. No rash noted. No erythema. No pallor.  Psychiatric: His mood appears anxious. His speech is delayed. He is slowed and withdrawn. He is inattentive.  Nursing  note and vitals reviewed.   ED Treatments / Results  DIAGNOSTIC STUDIES:  Oxygen Saturation is 100% on RA, normal by my interpretation.    COORDINATION OF CARE:  12:36 AM Will order Zofran. Discussed treatment plan with pt at bedside and pt agreed to plan.  Labs (all labs ordered are listed, but only abnormal results are displayed) Results for orders placed or performed during the hospital encounter of 07/19/16  Comprehensive metabolic panel  Result Value Ref Range   Sodium 133 (L) 135 - 145 mmol/L   Potassium 4.7 3.5 - 5.1 mmol/L   Chloride 94 (L) 101 - 111 mmol/L   CO2 15 (L) 22 - 32 mmol/L   Glucose, Bld 464 (H) 65 - 99 mg/dL   BUN 30 (H) 6 - 20 mg/dL   Creatinine, Ser 1.97 (H) 0.61 - 1.24 mg/dL   Calcium 10.0 8.9 - 10.3 mg/dL   Total Protein 8.9 (H) 6.5 - 8.1 g/dL   Albumin 4.5 3.5 - 5.0 g/dL  AST <5 (L) 15 - 41 U/L   ALT 19 17 - 63 U/L   Alkaline Phosphatase 94 38 - 126 U/L   Total Bilirubin 2.3 (H) 0.3 - 1.2 mg/dL   GFR calc non Af Amer 44 (L) >60 mL/min   GFR calc Af Amer 51 (L) >60 mL/min   Anion gap 24 (H) 5 - 15  CBC with Differential  Result Value Ref Range   WBC 13.9 (H) 4.0 - 10.5 K/uL   RBC 4.58 4.22 - 5.81 MIL/uL   Hemoglobin 14.1 13.0 - 17.0 g/dL   HCT 40.5 39.0 - 52.0 %   MCV 88.4 78.0 - 100.0 fL   MCH 30.8 26.0 - 34.0 pg   MCHC 34.8 30.0 - 36.0 g/dL   RDW 13.2 11.5 - 15.5 %   Platelets 275 150 - 400 K/uL   Neutrophils Relative % 75 %   Neutro Abs 10.5 (H) 1.7 - 7.7 K/uL   Lymphocytes Relative 22 %   Lymphs Abs 3.1 0.7 - 4.0 K/uL   Monocytes Relative 2 %   Monocytes Absolute 0.3 0.1 - 1.0 K/uL   Eosinophils Relative 0 %   Eosinophils Absolute 0.0 0.0 - 0.7 K/uL   Basophils Relative 1 %   Basophils Absolute 0.1 0.0 - 0.1 K/uL  POC CBG, ED  Result Value Ref Range   Glucose-Capillary 474 (H) 65 - 99 mg/dL   Laboratory interpretation all normal except hyperglycemia, DKA, leukocytosis    EKG  EKG Interpretation None       Radiology No  results found.  Procedures Procedures (including critical care time)  Medications Ordered in ED Medications  insulin regular (NOVOLIN R,HUMULIN R) 250 Units in sodium chloride 0.9 % 250 mL (1 Units/mL) infusion (not administered)  sodium chloride 0.9 % bolus 1,000 mL (not administered)  sodium chloride 0.9 % bolus 1,000 mL (1,000 mLs Intravenous New Bag/Given 07/19/16 0055)  sodium chloride 0.9 % bolus 1,000 mL (1,000 mLs Intravenous New Bag/Given 07/19/16 0101)  ondansetron (ZOFRAN) injection 4 mg (4 mg Intravenous Given 07/19/16 0102)     Initial Impression / Assessment and Plan / ED Course  I have reviewed the triage vital signs and the nursing notes.  Pertinent labs & imaging results that were available during my care of the patient were reviewed by me and considered in my medical decision making (see chart for details).  Clinical Course   During my exam although asked to not drink anything since he had just vomited prior to coming to the ED, patient drank a large amount of ginger ale that he brought with him to the ED. He then immediately asked for emesis bag. Nurses report he gets up out of his stretcher to drink out of the faucet.  Pt was started on IV fluids and IV nausea medications. He has presumed DKA.   After reviewing his labs he was started on an insulin drip for DKA.   Pt and mother informed he has DKA and will need to be admitted, also discussed his renal insufficiency.  01:47 AM Dr Myna Hidalgo, admit to obs, step down, Dr [name] attending.  Final Clinical Impressions(s) / ED Diagnoses   Final diagnoses:  Diabetic ketoacidosis without coma associated with type 1 diabetes mellitus ([place])  Dehydration  Acute renal insufficiency   Plan admission  CRITICAL CARE Performed by: Vinton Layson L Almer Bushey Total critical care time: 32 minutes Critical care time was exclusive of separately billable procedures and treating other patients. Critical care was necessary  to treat or prevent  imminent or life-threatening deterioration. Critical care was time spent personally by me on the following activities: development of treatment plan with patient and/or surrogate as well as nursing, discussions with consultants, evaluation of patient's response to treatment, examination of patient, obtaining history from patient or surrogate, ordering and performing treatments and interventions, ordering and review of laboratory studies, ordering and review of radiographic studies, pulse oximetry and re-evaluation of patient's condition.    I personally performed the services described in this documentation, which was scribed in my presence. The recorded information has been reviewed and considered.  Rolland Porter, MD, Barbette Or, MD 07/19/16 [phone]

## 2016-07-19 NOTE — Progress Notes (Signed)
Inpatient Diabetes Program Recommendations  AACE/ADA: New Consensus Statement on Inpatient Glycemic Control (2015)  Target Ranges:  Prepandial:   less than 140 mg/dL      Peak postprandial:   less than 180 mg/dL (1-2 hours)      Critically ill patients:  140 - 180 mg/dL   Results for Nathaniel [name], Nathaniel [name] (MRN [id number]) as of 07/19/2016 09:26  Ref. Range 07/19/2016 00:37  Sodium Latest Ref Range: 135 - 145 mmol/L 133 (L)  Potassium Latest Ref Range: 3.5 - 5.1 mmol/L 4.7  Chloride Latest Ref Range: 101 - 111 mmol/L 94 (L)  CO2 Latest Ref Range: 22 - 32 mmol/L 15 (L)  BUN Latest Ref Range: 6 - 20 mg/dL 30 (H)  Creatinine Latest Ref Range: 0.61 - 1.24 mg/dL 1.97 (H)  Calcium Latest Ref Range: 8.9 - 10.3 mg/dL 10.0  EGFR (Non-African Amer.) Latest Ref Range: >60 mL/min 44 (L)  EGFR (African American) Latest Ref Range: >60 mL/min 51 (L)  Glucose Latest Ref Range: 65 - 99 mg/dL 464 (H)  Anion gap Latest Ref Range: 5 - 15  24 (H)    Admit with: DKA  History: Type 1 DM  Home DM Meds: Levemir 30 units BID  Current Insulin Orders: IV Insulin drip (started at 2am)     -Patient known to the Inpatient DM Program.  This is pt's 3rd admission this year for DKA.  -Per Record Review, note that DM Coordinator spoke with pt at great length back on 01/05/16 (see notes for conversation between pt and DM Coordinator).  -Note that mother of pt endorses pt is non-compliant with his insulin regimen at home.  -DM Coordinator also encouraged pt to re-establish care under an Endocrinologist back in April, however, pt refused stating that Endocrinologists "fuss too much about blood sugars".        MD- Note Patient not ready to transition off IV Insulin drip yet.  8am BMET today shows Anion gap still elevated to 18 and CO2 only up to 18 as well.  When pt finally is ready to transition to SQ insulin, please consider the following:  Start Levemir 30 units daily (50% home dose) at least 1 hour  before IV Insulin drip stopped  Start Novolog Sensitive Correction Scale/ SSI (0-9 units) TID AC + HS       --Will follow patient during hospitalization--  Wyn Quaker RN, MSN, CDE Diabetes Coordinator Inpatient Glycemic Control Team Team Pager: [phone] (8a-5p)

## 2016-07-19 NOTE — ED Notes (Signed)
Pt has had third stool

## 2016-07-19 NOTE — ED Notes (Signed)
On bed pan for stool- stool is light brown, soft and formed

## 2016-07-19 NOTE — ED Triage Notes (Signed)
Patient is unsure of the last time he was able to take his medications and keep them down.  Patient diaphoretic.

## 2016-07-19 NOTE — H&P (Signed)
History and Physical    KYIAN OBST TZG:[id number] DOB: 08-20-1986 DOA: 07/19/2016  PCP: Maricela Curet, MD   Patient coming from: Home  Chief Complaint: Abdominal pain with N/V  HPI: Nathaniel [name] is a 30 y.o. male with medical history significant for type 1 diabetes mellitus, hypertension, hyperlipidemia, and poor adherence with his medications, now presenting to the emergency department with 1 day of abdominal pain, nausea, and nonbloody nonbilious vomiting. Patient reports that he had been in his usual state of health until the insidious development of a generalized abdominal pain yesterday evening. The pain was described as severe, cramping, generalized to the abdomen, exacerbated by oral intake, and associated with nausea and nonbloody nonbilious vomiting. Patient denies any recent fevers, chills, chest pain, palpitations, dyspnea, or cough. Patient reports continued adherence with his insulin regimen, but his mother at the bedside disputes this. He denies any recent use of alcohol or illicit substances, and denies dietary indiscretion.   ED Course: Upon arrival to the ED, patient is found to be afebrile, saturating well on room air, hypertensive to 180/120, and with vitals otherwise stable. EKG demonstrates sinus tachycardia with rate 105 and LVH by voltage criteria. Chemistry panel is notable for a hyponatremia to 133, serum bicarbonate of 15, glucose 464, and anion gap of 24. Chemistry panel was also notable for serum creatinine 1.97, up from an apparent baseline of 1, as well as total bilirubin of 2.3, higher than priors. Patient was given 3 L of normal saline in the emergency department and started on insulin infusion. He was given Zofran for his nausea and vomiting. Tachycardia resolved with the fluid, patient remained hemodynamically stable, and will be admitted to the stepdown unit for ongoing evaluation and management of DKA, suspected secondary to nonadherence but with  alternative etiology is not entirely excluded.  Review of Systems:  All other systems reviewed and apart from HPI, are negative.  Past Medical History:  Diagnosis Date  . Diabetes mellitus    lantus/novolog  . Hyperlipidemia   . Hypertension   . Marijuana abuse    occaisionally  . Noncompliance with medication regimen   . Tobacco abuse    5/day    Past Surgical History:  Procedure Laterality Date  . EYE SURGERY       reports that he has been smoking Cigarettes.  He has a 8.00 pack-year smoking history. He has never used smokeless tobacco. He reports that he uses drugs, including Marijuana. He reports that he does not drink alcohol.  Allergies  Allergen Reactions  . Orange     Increases blood sugar immediately     Family History  Problem Relation Age of Onset  . Diabetes Father   . Kidney failure Father     last 4 mnths of life  . Hypertension Mother      Prior to Admission medications   Medication Sig Start Date End Date Taking? Authorizing Provider  amLODipine (NORVASC) 5 MG tablet Take 1 tablet (5 mg total) by mouth daily. 04/17/16  Yes [name], MD  LEVEMIR FLEXTOUCH 100 UNIT/ML Pen INJECT 30 UNITS SUBCUTNEOUSLY A.C. Bid , Breakfast and supper. 04/17/16  Yes [name], MD  lisinopril (PRINIVIL,ZESTRIL) 10 MG tablet Take 1 tablet (10 mg total) by mouth daily. 04/04/15  Yes [name], MD  ondansetron (ZOFRAN ODT) 4 MG disintegrating tablet Take 1 tablet (4 mg total) by mouth every 8 (eight) hours as needed for nausea or vomiting. 05/14/16  Yes Derma, DO  pantoprazole (PROTONIX) 40 MG tablet Take 1 tablet (40 mg total) by mouth daily. 04/17/16  Yes [name], MD  pravastatin (PRAVACHOL) 40 MG tablet Take 1 tablet (40 mg total) by mouth daily at 6 PM. 06/10/15  Yes [name], MD    Physical Exam: Vitals:   07/19/16 0128 07/19/16 0146 07/19/16 0155 07/19/16 0200  BP: (!) 158/110 (!) 158/110 (!) 185/108 165/96  Pulse: 94 94  102 101  Resp: 18 18 18 25   Temp:      TempSrc:      SpO2: 100% 100% 100% 100%  Weight:      Height:          Constitutional: No respiratory distress, very thin, in apparent discomfort  Eyes: PERTLA, lids and conjunctivae normal ENMT: Mucous membranes are dry. Posterior pharynx clear of any exudate or lesions.   Neck: normal, supple, no masses, no thyromegaly Respiratory: clear to auscultation bilaterally, no wheezing, no crackles. Normal respiratory effort.  Cardiovascular: S1 & S2 heard, regular rate and rhythm, hyperdynamic precordium. No extremity edema. No significant JVD. Abdomen: No distension, mild tenderness throughout, no rebound pain or guarding, no masses palpated. Bowel sounds normal.  Musculoskeletal: no clubbing / cyanosis. No joint deformity upper and lower extremities. Normal muscle tone.  Skin: no significant rashes, lesions, ulcers. Warm, dry, well-perfused. Poor turgor.  Neurologic: CN 2-12 grossly intact. Sensation intact, DTR normal. Strength 5/5 in all 4 limbs.  Psychiatric: Normal judgment and insight. Alert and oriented x 3. Normal mood and affect.     Labs on Admission: I have personally reviewed following labs and imaging studies  CBC:  Recent Labs Lab 07/19/16 0037  WBC 13.9*  NEUTROABS 10.5*  HGB 14.1  HCT 40.5  MCV 88.4  PLT 893   Basic Metabolic Panel:  Recent Labs Lab 07/19/16 0037  NA 133*  K 4.7  CL 94*  CO2 15*  GLUCOSE 464*  BUN 30*  CREATININE 1.97*  CALCIUM 10.0   GFR: Estimated Creatinine Clearance: 56.4 mL/min (by C-G formula based on SCr of 1.97 mg/dL (H)). Liver Function Tests:  Recent Labs Lab 07/19/16 0037  AST <5*  ALT 19  ALKPHOS 94  BILITOT 2.3*  PROT 8.9*  ALBUMIN 4.5   No results for input(s): LIPASE, AMYLASE in the last 168 hours. No results for input(s): AMMONIA in the last 168 hours. Coagulation Profile: No results for input(s): INR, PROTIME in the last 168 hours. Cardiac Enzymes: No results  for input(s): CKTOTAL, CKMB, CKMBINDEX, TROPONINI in the last 168 hours. BNP (last 3 results) No results for input(s): PROBNP in the last 8760 hours. HbA1C: No results for input(s): HGBA1C in the last 72 hours. CBG:  Recent Labs Lab 07/19/16 0033 07/19/16 0158  GLUCAP 474* 432*   Lipid Profile: No results for input(s): CHOL, HDL, LDLCALC, TRIG, CHOLHDL, LDLDIRECT in the last 72 hours. Thyroid Function Tests: No results for input(s): TSH, T4TOTAL, FREET4, T3FREE, THYROIDAB in the last 72 hours. Anemia Panel: No results for input(s): VITAMINB12, FOLATE, FERRITIN, TIBC, IRON, RETICCTPCT in the last 72 hours. Urine analysis:    Component Value Date/Time   COLORURINE YELLOW 05/14/2016 0320   APPEARANCEUR CLEAR 05/14/2016 0320   LABSPEC 1.020 05/14/2016 0320   PHURINE 6.0 05/14/2016 0320   GLUCOSEU NEGATIVE 05/14/2016 0320   HGBUR LARGE (A) 05/14/2016 0320   BILIRUBINUR NEGATIVE 05/14/2016 0320   KETONESUR 15 (A) 05/14/2016 0320   PROTEINUR >300 (A) 05/14/2016 0320   UROBILINOGEN 0.2 06/07/2015 1815   NITRITE  NEGATIVE 05/14/2016 0320   LEUKOCYTESUR NEGATIVE 05/14/2016 0320   Sepsis Labs: @LABRCNTIP (procalcitonin:4,lacticidven:4) )No results found for this or any previous visit (from the past 240 hour(s)).   Radiological Exams on Admission: No results found.  EKG: Independently reviewed. Sinus tachycardia (rate 105), LVH by voltage criteria   Assessment/Plan  1. DKA, type I DM  - Suspect this is secondary to non-adherence given his mother's report to that effect   - There is a mild leukocytosis, likely reactive, without fever; UA and CXR ordered and pending; obtain cultures if febrile   - Suspect the hyperbilirubinemia is secondary to DKA with profound dehydration, but with abd pain, N/V, RUQ [place] ordered - He is managed with Levemir 30 units BID at home, though his adherence to this is disputed by his mother  - He has been started on insulin infusion in ED and this will be  continued per protocol until DKA has resolved  - He was given 3 L NS in ED and IVF hydration will be continued with NS at 125 cc/hr until glucose <250 - Follow serial chem panels q4h until DKA resolves    2. Nausea & vomiting  - Likely secondary to DKA; he is having soft BM's in ED, no diarrhea - T bili is elevated and RUQ [place] is pending  - Anticipate resolution with treatment of DKA  - Supportive care with antiemetics, IVF, monitoring and repletion of lytes   3. Leukocytosis  - WBC 13,900 on admission  - No fever; UA, CXR, RUQ [place] ordered and pending  - Suspect this is reactive in setting of DKA - Culture if febrile    4. AKI  - SCr 1.97 on admission, up from apparent baseline of 1  - Likely a prerenal azotemia in setting of DKA - Anticipate improvement with fluid-resuscitation and treatment of DKA  - Follow serial BMP q4h until DKA resolves - Hold lisinopril until renal function stabilizes   5. Hyponatremia  - Serum sodium 133 on admission in setting of marked hyperglycemia and dehydration  - Anticipate normalization with fluid-resuscitation and insulin  - Following serial chem panels, will adjust fluids prn    6. Hypertension  - Elevated on admission with pain, N/V likely contributing  - Managed at home with lisinopril and Norvasc; resume Norvasc once appropriate for a diet; hold lisinopril until renal fxn stabilizes  - Use hydralazine IVP's prn for now    DVT prophylaxis: sq heparin  Code Status: Full  Family Communication: Discussed with patient Disposition Plan: Admit to stepdown  Consults called: None Admission status: Inpatient    Vianne Bulls, MD [facility] Pager [phone]  If 7PM-7AM, please contact night-coverage [email or web address] Password [email or web address]  07/19/2016, 2:40 AM

## 2016-07-19 NOTE — ED Notes (Signed)
Glucose reported  335  Insulin drip started at 2.8 u/hr per glucose stabilizer

## 2016-07-19 NOTE — Progress Notes (Addendum)
This is an assumption of care note of this 30 year old patient of Dr. Timmothy Sours Diego's who was admitted early this morning with diabetic ketoacidosis,, abdominal pain, acute kidney injury, dehydration, and abdominal pain. He is still on insulin drip and has not met goals. Blood pressure still up at about 160/110. Pulse rate 115. He says he wants something to drink.  Constitutional: He looks acutely sick. He is sluggish from pain medication but is responsive Eyes: Pupils are reactive. EOMI. Ears nose mouth and throat: His mucous membranes are dry Cardiovascular: His heart is regular with tachycardia. Heart sounds are normal. He has no edema Respiratory: His respiratory effort is still somewhat elevated. His lungs are clear Gastrointestinal: His abdomen is diffusely tender with mildly hyperactive bowel sounds Skin mildly diaphoretic and moist Neurologic: As mentioned he is sluggish but arousable moves all 4 extremities and has no focal abnormalities  He has diabetic ketoacidosis. He's complaining of abdominal pain. He may have pancreatitis. He does have elevated liver functions which would lead to more of a concern about that. He has acute kidney injury with creatinine 1.97 up from 1. Although he had earlier been hemodynamically stable his blood pressure is still quite elevated and his pulse rate is also elevated. He has not met goals yet to come off insulin drip.  Check lipase. I don't see that on his laboratory work. Check CT abdomen and pelvis without contrast because his creatinine is still not good enough for him to get contrasted CT. I am particularly interested in seeing if he shows any abnormalities in the pancreas considering his abdominal pain and uncontrolled blood sugar. Because of his tachycardia I'm going to have him get another fluid bolus. Continue checking basic metabolic profile every 4 hours until he reaches goals. He's not ready for anything by mouth.  Addendum: He has ultrasound of the  abdomen ordered and that should allow [place] to evaluate his pancreas I will cancel CT abdomen and pelvis

## 2016-07-19 NOTE — ED Notes (Signed)
Pt vomiting after drinking ginger ale after doctor said not to drink. Pt asking for water but was denied due to doctors orders.

## 2016-07-20 LAB — GLUCOSE, CAPILLARY
GLUCOSE-CAPILLARY: 30 mg/dL — AB (ref 65–99)
GLUCOSE-CAPILLARY: 355 mg/dL — AB (ref 65–99)
GLUCOSE-CAPILLARY: 227 mg/dL — AB (ref 65–99)
GLUCOSE-CAPILLARY: 106 mg/dL — AB (ref 65–99)
GLUCOSE-CAPILLARY: 284 mg/dL — AB (ref 65–99)
Glucose-Capillary: 115 mg/dL — ABNORMAL HIGH (ref 65–99)

## 2016-07-20 LAB — BASIC METABOLIC PANEL
Anion gap: 10 (ref 5–15)
BUN: 13 mg/dL (ref 6–20)
CHLORIDE: 102 mmol/L (ref 101–111)
CO2: 20 mmol/L — AB (ref 22–32)
CREATININE: 1.33 mg/dL — AB (ref 0.61–1.24)
Calcium: 8.7 mg/dL — ABNORMAL LOW (ref 8.9–10.3)
GFR calc non Af Amer: >60 mL/min (ref >60)
Glucose, Bld: 306 mg/dL — ABNORMAL HIGH (ref 65–99)
POTASSIUM: 3.8 mmol/L (ref 3.5–5.1)
Sodium: 132 mmol/L — ABNORMAL LOW (ref 135–145)

## 2016-07-20 MED ORDER — FAMOTIDINE 20 MG PO TABS
20.0000 mg | ORAL_TABLET | Freq: Two times a day (BID) | ORAL | Status: DC
  Administered 2016-07-20 – 2016-07-22 (×4): 20 mg via ORAL
  Filled 2016-07-20 (×4): qty 1

## 2016-07-20 MED ORDER — INSULIN GLARGINE 100 UNIT/ML ~~LOC~~ SOLN
20.0000 [IU] | Freq: Two times a day (BID) | SUBCUTANEOUS | Status: DC
  Administered 2016-07-20 (×2): 20 [IU] via SUBCUTANEOUS
  Filled 2016-07-20 (×5): qty 0.2

## 2016-07-20 NOTE — Care Management Note (Signed)
Case Management Note  Patient Details  Name: Nathaniel [name] MRN: [id number] Date of Birth: 19-Jan-1986  Subjective/Objective:                  Pt admitted with DKA. Chart reviewed for CM needs. Pt is from home, ind with ADL's. Pt has insurance with drug coverage. Pt has PCP and transportation to appointments. Pt plans to return home with self care. No HH or DME needs anticipated.   Action/Plan: No CM needs.   Expected Discharge Date:  07/22/16               Expected Discharge Plan:  Home/Self Care  In-House Referral:  NA  Discharge planning Services  CM Consult  Post Acute Care Choice:  NA Choice offered to:  NA  Status of Service:  Completed, signed off Sherald Barge, RN 07/20/2016, 2:39 PM

## 2016-07-20 NOTE — Progress Notes (Signed)
Nathaniel [name] MRN:[id number] DOB: 02/14/1986 DOA: 07/19/2016 PCP: [name],[name], MD   Physical Exam: Blood pressure 121/77, pulse 87, temperature 98 F (36.7 C), temperature source Oral, resp. rate (!) 24, height 6' 1" (1.854 m), weight 63.6 kg (140 lb 3.4 oz), SpO2 100 %. Lungs clear to A&P no rales wheeze rhonchi heart regular rhythm no murmurs goes rubs abdomen soft nontender bowel sounds normoactive   Investigations:  Recent Results (from the past 240 hour(s))  MRSA PCR Screening     Status: None   Collection Time: 07/19/16  2:30 AM  Result Value Ref Range Status   MRSA by PCR NEGATIVE NEGATIVE Final    Comment:        The GeneXpert MRSA Assay (FDA approved for NASAL specimens only), is one component of a comprehensive MRSA colonization surveillance program. It is not intended to diagnose MRSA infection nor to guide or monitor treatment for MRSA infections.      Basic Metabolic Panel:  Recent Labs  07/19/16 1423 07/19/16 1858  NA 136 135  K 4.6 4.2  CL 104 104  CO2 20* 20*  GLUCOSE 134* 131*  BUN 22* 21*  CREATININE 1.61* 1.54*  CALCIUM 8.9 8.9   Liver Function Tests:  Recent Labs  07/19/16 0037  AST <5*  ALT 19  ALKPHOS 94  BILITOT 2.3*  PROT 8.9*  ALBUMIN 4.5     CBC:  Recent Labs  07/19/16 0037  WBC 13.9*  NEUTROABS 10.5*  HGB 14.1  HCT 40.5  MCV 88.4  PLT 275    Dg Chest Port 1 View  Result Date: 07/19/2016 CLINICAL DATA:  Diabetes.  Smoker. EXAM: PORTABLE CHEST 1 VIEW COMPARISON:  05/14/2016 . FINDINGS: Mediastinum hilar structures are unremarkable. Heart size normal. Stable mild right. Ocular thickening. No pleural effusion or pneumothorax . IMPRESSION: No acute cardiopulmonary disease.  Stable chest. Electronically Signed   By: Thomas  Register   On: 07/19/2016 09:05   Us Abdomen Limited Ruq  Result Date: 07/19/2016 CLINICAL DATA:  Hyperbilirubinemia. EXAM: US ABDOMEN LIMITED - RIGHT UPPER QUADRANT COMPARISON:   05/14/2016 .  CT 11/22/2015. FINDINGS: Gallbladder: No gallstones or wall thickening visualized. No sonographic Murphy sign noted by sonographer. Common bile duct: Diameter: 3.1 mm Liver: No focal lesion identified. Within normal limits in parenchymal echogenicity. Questionable mild increased echogenicity throughout the right kidney. Clinical correlation suggested to exclude chronic medical renal disease. IMPRESSION: 1.  No acute abnormality.  No gallstones or biliary distention. 2. Questionable mild increased echogenicity right kidney. Clinical correlation suggested to exclude chronic medical renal disease. Electronically Signed   By: Thomas  Register   On: 07/19/2016 09:32      Medications:  Impression:  Principal Problem:   DKA (diabetic ketoacidoses) (HCC) Active Problems:   Leukocytosis   Essential hypertension, benign   Type 1 diabetes mellitus with complication (HCC)   Nausea with vomiting   AKI (acute kidney injury) (HCC)   Noncompliance with medications   Hyponatremia   Diabetic ketoacidosis without coma associated with type 1 diabetes mellitus (HCC)   Hyperbilirubinemia     Plan: Regular floor. Carb modified diet. Lantus 20 units now and twice a day. NovoLog 10 units before meals 3 times a day. Be met in a.m. Consultants:    Procedures   Antibiotics:         Time spent: 30 minutes   LOS: 1 day   [name],[name]   07/20/2016, 12:46 PM    

## 2016-07-20 NOTE — Progress Notes (Signed)
Patient transferred to [address] Dept. Via wheelchair. Report given to nurse.

## 2016-07-20 NOTE — Progress Notes (Signed)
Inpatient Diabetes Program Recommendations  AACE/ADA: New Consensus Statement on Inpatient Glycemic Control (2015)  Target Ranges:  Prepandial:   less than 140 mg/dL      Peak postprandial:   less than 180 mg/dL (1-2 hours)      Critically ill patients:  140 - 180 mg/dL   Results for ALECXANDER, MAINWARING (MRN [id number]) as of 07/20/2016 13:06  Ref. Range 07/20/2016 07:21 07/20/2016 08:12 07/20/2016 11:45  Glucose-Capillary Latest Ref Range: 65 - 99 mg/dL 30 (LL) 115 (H) 355 (H)    Admit with: DKA  History: Type 1 DM  Home DM Meds: Levemir 30 units BID  Current Insulin Orders: Lantus 20 units BID      Novolog Sensitive Correction Scale/ SSI (0-9 units) TID AC      -Patient transitioned off IV Insulin drip yesterday.  Was given 10 units Lantus at 5pm and IV Insulin drip stopped.  Received 1 unit Novolog SSI coverage at 5pm for CBG of 139 mg/dl.  -Severe Hypoglycemia this AM (CBG 30 mg/dl) followed by CBG of 355 mg/dl at pre-lunch.  -Patient to start Lantus 20 units BID today.       MD- Unsure if Lantus 20 units BID will be to much basal insulin for patient?  Per RN, patient's mother has stated that pt will NOT check CBGs and often neglects to take his insulin.  He has been counseled about the importance of taking insulin, checking CBGs.  Looks like he may need some Novolog Meal Coverage as well?  Please consider the following:  1. Reduce Lantus to 15 units BID  2. Start Novolog Meal Coverage: Novolog 3 units TIDWC (hold if pt eats <50% of meal)      --Will follow patient during hospitalization--  Wyn Quaker RN, MSN, CDE Diabetes Coordinator Inpatient Glycemic Control Team Team Pager: [phone] (8a-5p)

## 2016-07-21 LAB — GLUCOSE, CAPILLARY
GLUCOSE-CAPILLARY: 23 mg/dL — AB (ref 65–99)
GLUCOSE-CAPILLARY: 71 mg/dL (ref 65–99)
GLUCOSE-CAPILLARY: 233 mg/dL — AB (ref 65–99)
GLUCOSE-CAPILLARY: 137 mg/dL — AB (ref 65–99)
Glucose-Capillary: 249 mg/dL — ABNORMAL HIGH (ref 65–99)

## 2016-07-21 MED ORDER — INSULIN GLARGINE 100 UNIT/ML ~~LOC~~ SOLN
10.0000 [IU] | Freq: Every day | SUBCUTANEOUS | Status: DC
  Administered 2016-07-21: 10 [IU] via SUBCUTANEOUS
  Filled 2016-07-21 (×2): qty 0.1

## 2016-07-21 MED ORDER — INSULIN GLARGINE 100 UNIT/ML ~~LOC~~ SOLN
20.0000 [IU] | Freq: Every morning | SUBCUTANEOUS | Status: DC
  Administered 2016-07-22: 20 [IU] via SUBCUTANEOUS
  Filled 2016-07-21 (×2): qty 0.2

## 2016-07-21 NOTE — Progress Notes (Signed)
Nurse called Dr. Denita Lung office, spoke to Dr. [name]. Per Dr. [name] place new orders as follows: Lanuts 20units every morning, Lantus 10units every night. Discontinue Lantus 20units BID. Nurse completed previous requests.

## 2016-07-21 NOTE — Progress Notes (Signed)
A.m. glucose dropped to 23 this morning her previous nighttime Lantus to 10 units patient eats more at home. SAHIR TOLSON UGQ:[id number] DOB: 1986/09/15 DOA: 07/19/2016 PCP: Maricela Curet, MD   Physical Exam: Blood pressure (!) 154/100, pulse 88, temperature 98.1 F (36.7 C), temperature source Oral, resp. rate 16, height 6\' 1"  (1.854 m), weight 67 kg (147 lb 12.8 oz), SpO2 100 %. Lungs clear no rales wheeze rhonchi heart regular rhythm no murmurs gallops or rubs   Investigations:  Recent Results (from the past 240 hour(s))  MRSA PCR Screening     Status: None   Collection Time: 07/19/16  2:30 AM  Result Value Ref Range Status   MRSA by PCR NEGATIVE NEGATIVE Final    Comment:        The GeneXpert MRSA Assay (FDA approved for NASAL specimens only), is one component of a comprehensive MRSA colonization surveillance program. It is not intended to diagnose MRSA infection nor to guide or monitor treatment for MRSA infections.      Basic Metabolic Panel:  Recent Labs  07/19/16 1858 07/20/16 1247  NA 135 132*  K 4.2 3.8  CL 104 102  CO2 20* 20*  GLUCOSE 131* 306*  BUN 21* 13  CREATININE 1.54* 1.33*  CALCIUM 8.9 8.7*   Liver Function Tests:  Recent Labs  07/19/16 0037  AST <5*  ALT 19  ALKPHOS 94  BILITOT 2.3*  PROT 8.9*  ALBUMIN 4.5     CBC:  Recent Labs  07/19/16 0037  WBC 13.9*  NEUTROABS 10.5*  HGB 14.1  HCT 40.5  MCV 88.4  PLT 275    No results found.    Medications:   Impression:  Principal Problem:   DKA (diabetic ketoacidoses) (HCC) Active Problems:   Leukocytosis   Essential hypertension, benign   Type 1 diabetes mellitus with complication (HCC)   Nausea with vomiting   AKI (acute kidney injury) ([place])   Noncompliance with medications   Hyponatremia   Diabetic ketoacidosis without coma associated with type 1 diabetes mellitus ([place])   Hyperbilirubinemia     Plan: Discharge in a.m. his glycemic control is not  erratic   Consultants:    Procedures   Antibiotics:         Time spent: 30 minutes   LOS: 2 days   Vivi Piccirilli M   07/21/2016, 12:37 PM

## 2016-07-21 NOTE — Care Management Important Message (Signed)
Important Message  Patient Details  Name: Nathaniel [name] MRN: [id number] Date of Birth: February 21, 1986   Medicare Important Message Given:  Yes    Arlene Brickel, Chauncey Reading, RN 07/21/2016, 10:13 AM

## 2016-07-21 NOTE — Progress Notes (Signed)
blood glucose 23 at 0800 CBG, pt is eating breakfast and reports feeling "fine". Pt given juice. Blood glucose rechecked after breakfast, blood glucose was 71, pt reports feeling okay. Pt reports sugar drops sometimes when patient is sleeping. Nurse assessed patient, pt is alert, oriented and is asymptomatic at this time. Nurse will page provider.

## 2016-07-22 LAB — GLUCOSE, CAPILLARY
GLUCOSE-CAPILLARY: 244 mg/dL — AB (ref 65–99)
Glucose-Capillary: 40 mg/dL — CL (ref 65–99)
Glucose-Capillary: 89 mg/dL (ref 65–99)

## 2016-07-22 NOTE — Progress Notes (Signed)
Patient with orders to be discharge home. Discharge instructions given, patient verbalized understanding. Patient stable. Patient left in private vehicle with family.  

## 2016-07-22 NOTE — Discharge Summary (Signed)
Physician Discharge Summary  Nathaniel [name] SPQ:[id number] DOB: 1985-09-22 DOA: 07/19/2016  PCP: Nathaniel Curet, MD  Admit date: 07/19/2016 Discharge date: 07/22/2016   Recommendations for Outpatient Follow-up:  Patient is advised felt my office within 1 week's time to assess glycemic control hemodynamics blood pressure and renal function he is likewise advised to take all his pre-admission hospital medicines as prescribed Discharge Diagnoses:  Principal Problem:   DKA (diabetic ketoacidoses) ([place]) Active Problems:   Leukocytosis   Essential hypertension, benign   Type 1 diabetes mellitus with complication (HCC)   Nausea with vomiting   AKI (acute kidney injury) ([place])   Noncompliance with medications   Hyponatremia   Diabetic ketoacidosis without coma associated with type 1 diabetes mellitus ([place])   Hyperbilirubinemia   Discharge Condition: Good  Filed Weights   07/19/16 0300 07/20/16 0500 07/20/16 1449  Weight: 63.6 kg (140 lb 3.4 oz) 63.6 kg (140 lb 3.4 oz) 67 kg (147 lb 12.8 oz)    History of present illness:  Patient is a 30 year old black male with chronic type 1 diabetes insulin-dependent who was chronic noncompliance ran out of his insulin again and was admitted with DKA placed on Glucomander protocol in ICU. Acute kidney injury due to prerenal acidemia is given aggressive fluid resuscitation and aggressive insulin IV protocol and was became normal glycemic and resumed on a long-acting insulin all in hospital and had fair glucose control he had several up with hypoglycemia but he states his carb modified diet in hospital much less than he is at home this is expected to rectify itself with his preadmission insulin regimen which will be as stated once he arrives home  Hospital Course:  See history of present illness above  Procedures:    Consultations:    Discharge Instructions  Discharge Instructions    Discharge instructions    Complete by:  As  directed    Discharge patient    Complete by:  As directed        Medication List    TAKE these medications   amLODipine 5 MG tablet Commonly known as:  NORVASC Take 1 tablet (5 mg total) by mouth daily.   LEVEMIR FLEXTOUCH 100 UNIT/ML Pen Generic drug:  Insulin Detemir INJECT 30 UNITS SUBCUTNEOUSLY A.C. Bid , Breakfast and supper. What changed:  how much to take  how to take this  when to take this  additional instructions   lisinopril 10 MG tablet Commonly known as:  PRINIVIL,ZESTRIL Take 1 tablet (10 mg total) by mouth daily.   ondansetron 4 MG disintegrating tablet Commonly known as:  ZOFRAN ODT Take 1 tablet (4 mg total) by mouth every 8 (eight) hours as needed for nausea or vomiting.   pantoprazole 40 MG tablet Commonly known as:  PROTONIX Take 1 tablet (40 mg total) by mouth daily.   pravastatin 40 MG tablet Commonly known as:  PRAVACHOL Take 1 tablet (40 mg total) by mouth daily at 6 PM.      Allergies  Allergen Reactions  . Orange     Increases blood sugar immediately       The results of significant diagnostics from this hospitalization (including imaging, microbiology, ancillary and laboratory) are listed below for reference.    Significant Diagnostic Studies: Dg Chest Port 1 View  Result Date: 07/19/2016 CLINICAL DATA:  Diabetes.  Smoker. EXAM: PORTABLE CHEST 1 VIEW COMPARISON:  05/14/2016 . FINDINGS: Mediastinum hilar structures are unremarkable. Heart size normal. Stable mild right. Ocular thickening. No pleural  effusion or pneumothorax . IMPRESSION: No acute cardiopulmonary disease.  Stable chest. Electronically Signed   By: Nathaniel [name]  Register   On: 07/19/2016 09:05   US Abdomen Limited Ruq  Result Date: 07/19/2016 CLINICAL DATA:  Hyperbilirubinemia. EXAM: US ABDOMEN LIMITED - RIGHT UPPER QUADRANT COMPARISON:  05/14/2016 .  CT 11/22/2015. FINDINGS: Gallbladder: No gallstones or wall thickening visualized. No sonographic Murphy sign noted  by sonographer. Common bile duct: Diameter: 3.1 mm Liver: No focal lesion identified. Within normal limits in parenchymal echogenicity. Questionable mild increased echogenicity throughout the right kidney. Clinical correlation suggested to exclude chronic medical renal disease. IMPRESSION: 1.  No acute abnormality.  No gallstones or biliary distention. 2. Questionable mild increased echogenicity right kidney. Clinical correlation suggested to exclude chronic medical renal disease. Electronically Signed   By: Nathaniel [name]  Register   On: 07/19/2016 09:32    Microbiology: Recent Results (from the past 240 hour(s))  MRSA PCR Screening     Status: None   Collection Time: 07/19/16  2:30 AM  Result Value Ref Range Status   MRSA by PCR NEGATIVE NEGATIVE Final    Comment:        The GeneXpert MRSA Assay (FDA approved for NASAL specimens only), is one component of a comprehensive MRSA colonization surveillance program. It is not intended to diagnose MRSA infection nor to guide or monitor treatment for MRSA infections.      Labs: Basic Metabolic Panel:  Recent Labs Lab 07/19/16 0750 07/19/16 1030 07/19/16 1423 07/19/16 1858 07/20/16 1247  NA 137 139 136 135 132*  K 4.9 4.6 4.6 4.2 3.8  CL 101 106 104 104 102  CO2 18* 20* 20* 20* 20*  GLUCOSE 251* 122* 134* 131* 306*  BUN 25* 23* 22* 21* 13  CREATININE 1.77* 1.53* 1.61* 1.54* 1.33*  CALCIUM 9.7 9.3 8.9 8.9 8.7*   Liver Function Tests:  Recent Labs Lab 07/19/16 0037  AST <5*  ALT 19  ALKPHOS 94  BILITOT 2.3*  PROT 8.9*  ALBUMIN 4.5    Recent Labs Lab 07/19/16 0750  LIPASE 12   No results for input(s): AMMONIA in the last 168 hours. CBC:  Recent Labs Lab 07/19/16 0037  WBC 13.9*  NEUTROABS 10.5*  HGB 14.1  HCT 40.5  MCV 88.4  PLT 275   Cardiac Enzymes: No results for input(s): CKTOTAL, CKMB, CKMBINDEX, TROPONINI in the last 168 hours. BNP: BNP (last 3 results) No results for input(s): BNP in the last 8760  hours.  ProBNP (last 3 results) No results for input(s): PROBNP in the last 8760 hours.  CBG:  Recent Labs Lab 07/21/16 1620 07/21/16 2219 07/22/16 0630 07/22/16 0720 07/22/16 1118  GLUCAP 137* 249* 40* 89 244*       Signed:  [name] [name]  Nathaniel [name] Pager: [phone] 07/22/2016, 12:46 PM

## 2016-07-22 NOTE — Progress Notes (Signed)
Results for CAL, GINDLESPERGER (MRN [id number]) as of 07/22/2016 08:39  Ref. Range 07/21/2016 11:21 07/21/2016 16:20 07/21/2016 22:19 07/22/2016 06:30 07/22/2016 07:20  Glucose-Capillary Latest Ref Range: 65 - 99 mg/dL 233 (H) 137 (H) 249 (H) 40 (LL) 89  Noted that patient's blood sugar dropped to 40 mg/dl this am.  Recommend decreasing Lantus to 15 units every HS, continue Novolog SENSITIVE TID & HS, and adding Novolog 3 units TID with meals if eating at least 50 % of meals. Patient has Type 1 diabetes and needs a combination of basal and meal time insulin. Harvel Ricks RN BSN CDE

## 2016-08-29 ENCOUNTER — Encounter (HOSPITAL_COMMUNITY): Payer: Self-pay | Admitting: Emergency Medicine

## 2016-08-29 ENCOUNTER — Inpatient Hospital Stay (HOSPITAL_COMMUNITY)
Admission: EM | Admit: 2016-08-29 | Discharge: 2016-08-31 | DRG: 639 | Disposition: A | Discharge status: Home / Self Care | Payer: Medicare Other | Attending: Family Medicine | Admitting: Family Medicine

## 2016-08-29 DIAGNOSIS — E1043 Type 1 diabetes mellitus with diabetic autonomic (poly)neuropathy: Secondary | ICD-10-CM | POA: Diagnosis present

## 2016-08-29 DIAGNOSIS — Z833 Family history of diabetes mellitus: Secondary | ICD-10-CM | POA: Diagnosis not present

## 2016-08-29 DIAGNOSIS — F17210 Nicotine dependence, cigarettes, uncomplicated: Secondary | ICD-10-CM | POA: Diagnosis present

## 2016-08-29 DIAGNOSIS — E1069 Type 1 diabetes mellitus with other specified complication: Secondary | ICD-10-CM

## 2016-08-29 DIAGNOSIS — F5089 Other specified eating disorder: Secondary | ICD-10-CM

## 2016-08-29 DIAGNOSIS — E1310 Other specified diabetes mellitus with ketoacidosis without coma: Secondary | ICD-10-CM

## 2016-08-29 DIAGNOSIS — I10 Essential (primary) hypertension: Secondary | ICD-10-CM | POA: Diagnosis present

## 2016-08-29 DIAGNOSIS — K3184 Gastroparesis: Secondary | ICD-10-CM | POA: Diagnosis present

## 2016-08-29 DIAGNOSIS — R197 Diarrhea, unspecified: Secondary | ICD-10-CM

## 2016-08-29 DIAGNOSIS — E785 Hyperlipidemia, unspecified: Secondary | ICD-10-CM | POA: Diagnosis present

## 2016-08-29 DIAGNOSIS — Z794 Long term (current) use of insulin: Secondary | ICD-10-CM

## 2016-08-29 DIAGNOSIS — E860 Dehydration: Secondary | ICD-10-CM

## 2016-08-29 DIAGNOSIS — F1210 Cannabis abuse, uncomplicated: Secondary | ICD-10-CM | POA: Diagnosis present

## 2016-08-29 DIAGNOSIS — Z720 Tobacco use: Secondary | ICD-10-CM | POA: Diagnosis not present

## 2016-08-29 DIAGNOSIS — R112 Nausea with vomiting, unspecified: Secondary | ICD-10-CM | POA: Diagnosis not present

## 2016-08-29 DIAGNOSIS — F17200 Nicotine dependence, unspecified, uncomplicated: Secondary | ICD-10-CM | POA: Diagnosis present

## 2016-08-29 DIAGNOSIS — E0910 Drug or chemical induced diabetes mellitus with ketoacidosis without coma: Secondary | ICD-10-CM

## 2016-08-29 DIAGNOSIS — E1065 Type 1 diabetes mellitus with hyperglycemia: Secondary | ICD-10-CM

## 2016-08-29 DIAGNOSIS — E1010 Type 1 diabetes mellitus with ketoacidosis without coma: Secondary | ICD-10-CM | POA: Diagnosis not present

## 2016-08-29 LAB — URINALYSIS, ROUTINE W REFLEX MICROSCOPIC
BACTERIA UA: NONE SEEN
Bilirubin Urine: NEGATIVE
Glucose, UA: >=500 mg/dL — AB
KETONES UR: 20 mg/dL — AB
LEUKOCYTES UA: NEGATIVE
NITRITE: NEGATIVE
PH: 5 (ref 5.0–8.0)
PROTEIN: 100 mg/dL — AB
Specific Gravity, Urine: 1.021 (ref 1.005–1.030)

## 2016-08-29 LAB — COMPREHENSIVE METABOLIC PANEL
ALBUMIN: 4.6 g/dL (ref 3.5–5.0)
ALT: 30 U/L (ref 17–63)
ANION GAP: 22 — AB (ref 5–15)
AST: 33 U/L (ref 15–41)
Alkaline Phosphatase: 100 U/L (ref 38–126)
BILIRUBIN TOTAL: 1.8 mg/dL — AB (ref 0.3–1.2)
BUN: 34 mg/dL — ABNORMAL HIGH (ref 6–20)
CO2: 21 mmol/L — ABNORMAL LOW (ref 22–32)
Calcium: 10.3 mg/dL (ref 8.9–10.3)
Chloride: 87 mmol/L — ABNORMAL LOW (ref 101–111)
Creatinine, Ser: 2.07 mg/dL — ABNORMAL HIGH (ref 0.61–1.24)
GFR calc Af Amer: 48 mL/min — ABNORMAL LOW (ref >60)
GFR calc non Af Amer: 41 mL/min — ABNORMAL LOW (ref >60)
GLUCOSE: 762 mg/dL — AB (ref 65–99)
POTASSIUM: 5.5 mmol/L — AB (ref 3.5–5.1)
SODIUM: 130 mmol/L — AB (ref 135–145)
TOTAL PROTEIN: 9.1 g/dL — AB (ref 6.5–8.1)

## 2016-08-29 LAB — CBC
HEMATOCRIT: 40.6 % (ref 39.0–52.0)
HEMATOCRIT: 36.6 % — AB (ref 39.0–52.0)
HEMOGLOBIN: 14.2 g/dL (ref 13.0–17.0)
Hemoglobin: 12.9 g/dL — ABNORMAL LOW (ref 13.0–17.0)
MCH: 31 pg (ref 26.0–34.0)
MCH: 30.6 pg (ref 26.0–34.0)
MCHC: 35 g/dL (ref 30.0–36.0)
MCHC: 35.2 g/dL (ref 30.0–36.0)
MCV: 88.6 fL (ref 78.0–100.0)
MCV: 86.7 fL (ref 78.0–100.0)
Platelets: 310 10*3/uL (ref 150–400)
Platelets: 314 10*3/uL (ref 150–400)
RBC: 4.58 MIL/uL (ref 4.22–5.81)
RBC: 4.22 MIL/uL (ref 4.22–5.81)
RDW: 13 % (ref 11.5–15.5)
RDW: 13.1 % (ref 11.5–15.5)
WBC: 19.3 10*3/uL — ABNORMAL HIGH (ref 4.0–10.5)
WBC: 16.8 10*3/uL — AB (ref 4.0–10.5)

## 2016-08-29 LAB — CBG MONITORING, ED: Glucose-Capillary: 473 mg/dL — ABNORMAL HIGH (ref 65–99)

## 2016-08-29 LAB — GLUCOSE, CAPILLARY
GLUCOSE-CAPILLARY: 225 mg/dL — AB (ref 65–99)
Glucose-Capillary: 206 mg/dL — ABNORMAL HIGH (ref 65–99)

## 2016-08-29 LAB — LIPASE, BLOOD: LIPASE: 26 U/L (ref 11–51)

## 2016-08-29 MED ORDER — DEXTROSE 50 % IV SOLN: 25.0000 mL | INTRAVENOUS | Status: DC | PRN

## 2016-08-29 MED ORDER — ONDANSETRON HCL 4 MG/2ML IJ SOLN
4.0000 mg | INTRAMUSCULAR | Status: AC | PRN
Start: 2016-08-29 — End: 2016-08-30
  Administered 2016-08-29 – 2016-08-30 (×2): 4 mg via INTRAVENOUS
  Filled 2016-08-29 (×2): qty 2

## 2016-08-29 MED ORDER — SODIUM CHLORIDE 0.9 % IV SOLN: INTRAVENOUS | Status: DC

## 2016-08-29 MED ORDER — PRAVASTATIN SODIUM 40 MG PO TABS
40.0000 mg | ORAL_TABLET | Freq: Every day | ORAL | Status: DC
  Administered 2016-08-30: 40 mg via ORAL
  Filled 2016-08-29: qty 1

## 2016-08-29 MED ORDER — INSULIN REGULAR BOLUS VIA INFUSION
0.0000 [IU] | Freq: Three times a day (TID) | INTRAVENOUS | Status: DC
  Filled 2016-08-29: qty 10

## 2016-08-29 MED ORDER — SODIUM CHLORIDE 0.9 % IV SOLN: INTRAVENOUS | Status: AC

## 2016-08-29 MED ORDER — LISINOPRIL 10 MG PO TABS
10.0000 mg | ORAL_TABLET | Freq: Every day | ORAL | Status: DC
  Administered 2016-08-30 – 2016-08-31 (×2): 10 mg via ORAL
  Filled 2016-08-29 (×2): qty 1

## 2016-08-29 MED ORDER — SODIUM CHLORIDE 0.9 % IV SOLN
INTRAVENOUS | Status: DC
  Administered 2016-08-29: 5.4 [IU]/h via INTRAVENOUS
  Filled 2016-08-29: qty 2.5

## 2016-08-29 MED ORDER — DEXTROSE-NACL 5-0.45 % IV SOLN
INTRAVENOUS | Status: DC
  Administered 2016-08-29: 21:00:00 via INTRAVENOUS

## 2016-08-29 MED ORDER — SODIUM CHLORIDE 0.9 % IV BOLUS (SEPSIS)
2000.0000 mL | Freq: Once | INTRAVENOUS | Status: AC
  Administered 2016-08-29: 2000 mL via INTRAVENOUS

## 2016-08-29 MED ORDER — SODIUM CHLORIDE 0.9 % IV SOLN
INTRAVENOUS | Status: AC
  Filled 2016-08-29: qty 2.5

## 2016-08-29 MED ORDER — HEPARIN SODIUM (PORCINE) 5000 UNIT/ML IJ SOLN
5000.0000 [IU] | Freq: Three times a day (TID) | INTRAMUSCULAR | Status: DC
  Administered 2016-08-30 – 2016-08-31 (×5): 5000 [IU] via SUBCUTANEOUS
  Filled 2016-08-29 (×5): qty 1

## 2016-08-29 MED ORDER — SODIUM CHLORIDE 0.9 % IV SOLN
INTRAVENOUS | Status: DC
  Filled 2016-08-29: qty 2.5

## 2016-08-29 MED ORDER — DEXTROSE-NACL 5-0.45 % IV SOLN: INTRAVENOUS | Status: DC

## 2016-08-29 MED ORDER — HYDRALAZINE HCL 20 MG/ML IJ SOLN
10.0000 mg | Freq: Once | INTRAMUSCULAR | Status: AC
  Administered 2016-08-30: 10 mg via INTRAVENOUS
  Filled 2016-08-29: qty 1

## 2016-08-29 NOTE — ED Provider Notes (Signed)
[facility] DEPT Provider Note   CSN: [id number] Arrival date & time: 08/29/16  1756     History   Chief Complaint Chief Complaint  Patient presents with  . Hyperglycemia  . Emesis    HPI Nathaniel [name] is a 30 y.o. male.   Hyperglycemia  Associated symptoms: vomiting   Emesis     Pt was seen at 1850. Per pt and his family, c/o gradual onset and persistence of multiple intermittent episodes of N/VD that began earlier today. Pt does not check his CBG's at home, though notes compliance with his "long acting insulin." Denies abd pain, no black or blood in stools or emesis, no fevers, no back pain, no CP/SOB. The symptoms have been associated with no other complaints. The patient has a significant history of similar symptoms previously, recently being evaluated for this complaint and multiple prior evals for same.     Past Medical History:  Diagnosis Date  . Diabetes mellitus    lantus/novolog  . Hyperlipidemia   . Hypertension   . Marijuana abuse    occaisionally  . Noncompliance with medication regimen   . Tobacco abuse    5/day    Patient Active Problem List   Diagnosis Date Noted  . Diabetic ketoacidosis without coma associated with type 1 diabetes mellitus ([place])   . Hyperbilirubinemia   . Hyponatremia 04/15/2016  . Type 1 diabetes mellitus with hyperosmolarity without nonketotic hyperglycemic hyperosmolar coma ([place]) 04/15/2016  . Hyperglycemia 04/15/2016  . AKI (acute kidney injury) ([place]) 01/04/2016  . Severe uncontrolled hypertension 01/04/2016  . Noncompliance with medications 01/04/2016  . DKA (diabetic ketoacidoses) ([place]) 06/07/2015  . Intractable nausea and vomiting 04/01/2015  . Gastroparesis 04/01/2015  . Type 1 diabetes mellitus with complication ([place]) 03/54/6568  . Chronic hypertension   . Nausea with vomiting   . Abscess, gluteal, right 06/21/2014  . Nausea and vomiting 04/08/2013  . Hyperglycemia without ketosis 04/08/2013  . Essential  hypertension, benign 04/08/2013  . Hypercalcemia 07/29/2011  . Vomiting 07/28/2011  . Leukocytosis 07/28/2011  . Hypokalemia 07/28/2011  . DKA, type 1 ([place]) 07/27/2011  . Dehydration 07/27/2011  . Tobacco abuse 07/27/2011  . Marijuana abuse 07/27/2011    Past Surgical History:  Procedure Laterality Date  . EYE SURGERY         Home Medications    Prior to Admission medications   Medication Sig Start Date End Date Taking? Authorizing Provider  amLODipine (NORVASC) 5 MG tablet Take 1 tablet (5 mg total) by mouth daily. 04/17/16   [name], MD  LEVEMIR FLEXTOUCH 100 UNIT/ML Pen INJECT 30 UNITS SUBCUTNEOUSLY A.C. Bid , Breakfast and supper. Patient taking differently: Inject 30 Units into the skin 2 (two) times daily. INJECT [place]. Bid , Breakfast and supper. 04/17/16   [name], MD  lisinopril (PRINIVIL,ZESTRIL) 10 MG tablet Take 1 tablet (10 mg total) by mouth daily. 04/04/15   [name], MD  ondansetron (ZOFRAN ODT) 4 MG disintegrating tablet Take 1 tablet (4 mg total) by mouth every 8 (eight) hours as needed for nausea or vomiting. 05/14/16   Kristen N Ward, DO  pantoprazole (PROTONIX) 40 MG tablet Take 1 tablet (40 mg total) by mouth daily. 04/17/16   [name], MD  pravastatin (PRAVACHOL) 40 MG tablet Take 1 tablet (40 mg total) by mouth daily at 6 PM. 06/10/15   [name], MD    Family History Family History  Problem Relation Age of Onset  . Diabetes Father   .  Kidney failure Father     last 4 mnths of life  . Hypertension Mother     Social History Social History  Substance Use Topics  . Smoking status: Current Every Day Smoker    Packs/day: 1.00    Years: 8.00    Types: Cigarettes  . Smokeless tobacco: Never Used  . Alcohol use No     Allergies   Orange   Review of Systems Review of Systems  Gastrointestinal: Positive for vomiting.  ROS: Statement: All systems negative except as marked or noted in the  HPI; Constitutional: Negative for fever and chills. ; ; Eyes: Negative for eye pain, redness and discharge. ; ; ENMT: Negative for ear pain, hoarseness, nasal congestion, sinus pressure and sore throat. ; ; Cardiovascular: Negative for chest pain, palpitations, diaphoresis, dyspnea and peripheral edema. ; ; Respiratory: Negative for cough, wheezing and stridor. ; ; Gastrointestinal: +N/V/D. Negative for abdominal pain, blood in stool, hematemesis, jaundice and rectal bleeding. . ; ; Genitourinary: Negative for dysuria, flank pain and hematuria. ; ; Musculoskeletal: Negative for back pain and neck pain. Negative for swelling and trauma.; ; Skin: Negative for pruritus, rash, abrasions, blisters, bruising and skin lesion.; ; Neuro: Negative for headache, lightheadedness and neck stiffness. Negative for weakness, altered level of consciousness, altered mental status, extremity weakness, paresthesias, involuntary movement, seizure and syncope.      Physical Exam Updated Vital Signs BP (!) 209/119 (BP Location: Left Arm)   Pulse 110   Temp 97.7 F (36.5 C) (Oral)   Resp 16   Ht 6\' 1"  (1.854 m)   Wt 150 lb (68 kg)   SpO2 100%   BMI 19.79 kg/m   Physical Exam 1855: Physical examination:  Nursing notes reviewed; Vital signs and O2 SAT reviewed;  Constitutional: Well developed, Well nourished, In no acute distress; Head:  Normocephalic, atraumatic; Eyes: EOMI, PERRL, No scleral icterus; ENMT: Mouth and pharynx normal, Mucous membranes dry; Neck: Supple, Full range of motion, No lymphadenopathy; Cardiovascular: Regular rate and rhythm, No gallop; Respiratory: Breath sounds clear & equal bilaterally, No wheezes.  Speaking full sentences with ease, Normal respiratory effort/excursion; Chest: Nontender, Movement normal; Abdomen: Soft, Nontender, Nondistended, Normal bowel sounds; Genitourinary: No CVA tenderness; Extremities: Pulses normal, No tenderness, No edema, No calf edema or asymmetry.; Neuro: AA&Ox3,  Major CN grossly intact.  Speech clear. No gross focal motor or sensory deficits in extremities.; Skin: Color normal, Warm, Dry.   ED Treatments / Results  Labs (all labs ordered are listed, but only abnormal results are displayed)   EKG  EKG Interpretation None       Radiology   Procedures Procedures (including critical care time)  Medications Ordered in ED Medications  ondansetron (ZOFRAN) injection 4 mg (4 mg Intravenous Given 08/29/16 1902)  dextrose 5 %-0.45 % sodium chloride infusion (not administered)  insulin regular bolus via infusion 0-10 Units (not administered)  insulin regular (NOVOLIN R,HUMULIN R) 250 Units in sodium chloride 0.9 % 250 mL (1 Units/mL) infusion (not administered)  dextrose 50 % solution 25 mL (not administered)  0.9 %  sodium chloride infusion (not administered)  sodium chloride 0.9 % bolus 2,000 mL (2,000 mLs Intravenous New Bag/Given 08/29/16 1900)     Initial Impression / Assessment and Plan / ED Course  I have reviewed the triage vital signs and the nursing notes.  Pertinent labs & imaging results that were available during my care of the patient were reviewed by me and considered in my medical decision making (  see chart for details).  MDM Reviewed: previous chart, nursing note and vitals Reviewed previous: labs Interpretation: labs Total time providing critical care: 30-74 minutes. This excludes time spent performing separately reportable procedures and services. Consults: admitting MD   CRITICAL CARE Performed by: Alfonzo Feller Total critical care time: 35 minutes Critical care time was exclusive of separately billable procedures and treating other patients. Critical care was necessary to treat or prevent imminent or life-threatening deterioration. Critical care was time spent personally by me on the following activities: development of treatment plan with patient and/or surrogate as well as nursing, discussions with  consultants, evaluation of patient's response to treatment, examination of patient, obtaining history from patient or surrogate, ordering and performing treatments and interventions, ordering and review of laboratory studies, ordering and review of radiographic studies, pulse oximetry and re-evaluation of patient's condition.  Results for orders placed or performed during the hospital encounter of 08/29/16  Lipase, blood  Result Value Ref Range   Lipase 26 11 - 51 U/L  Comprehensive metabolic panel  Result Value Ref Range   Sodium 130 (L) 135 - 145 mmol/L   Potassium 5.5 (H) 3.5 - 5.1 mmol/L   Chloride 87 (L) 101 - 111 mmol/L   CO2 21 (L) 22 - 32 mmol/L   Glucose, Bld 762 (HH) 65 - 99 mg/dL   BUN 34 (H) 6 - 20 mg/dL   Creatinine, Ser 2.07 (H) 0.61 - 1.24 mg/dL   Calcium 10.3 8.9 - 10.3 mg/dL   Total Protein 9.1 (H) 6.5 - 8.1 g/dL   Albumin 4.6 3.5 - 5.0 g/dL   AST 33 15 - 41 U/L   ALT 30 17 - 63 U/L   Alkaline Phosphatase 100 38 - 126 U/L   Total Bilirubin 1.8 (H) 0.3 - 1.2 mg/dL   GFR calc non Af Amer 41 (L) >60 mL/min   GFR calc Af Amer 48 (L) >60 mL/min   Anion gap 22 (H) 5 - 15  CBC  Result Value Ref Range   WBC 19.3 (H) 4.0 - 10.5 K/uL   RBC 4.58 4.22 - 5.81 MIL/uL   Hemoglobin 14.2 13.0 - 17.0 g/dL   HCT 40.6 39.0 - 52.0 %   MCV 88.6 78.0 - 100.0 fL   MCH 31.0 26.0 - 34.0 pg   MCHC 35.0 30.0 - 36.0 g/dL   RDW 13.0 11.5 - 15.5 %   Platelets 310 150 - 400 K/uL  POC CBG, ED  Result Value Ref Range   Glucose-Capillary >600 (HH) 65 - 99 mg/dL    2005:  IVF NS boluses given before IV insulin gtt started. Dx and testing d/w pt and family.  Questions answered.  Verb understanding, agreeable to admit.  T/C to [facility]. Marin Comment, case discussed, including:  HPI, pertinent PM/SHx, VS/PE, dx testing, ED course and treatment:  Agreeable to admit, requests he will come to the ED for evaluation.     Final Clinical Impressions(s) / ED Diagnoses   Final diagnoses:  None    New  Prescriptions New Prescriptions   No medications on file      Francine Graven, DO 09/02/16 2050

## 2016-08-29 NOTE — ED Notes (Signed)
Dr Le at bedside,  

## 2016-08-29 NOTE — ED Notes (Signed)
Mother asks when pt will be going to room- she is informed that ICU unable to take report at this time. Patient will be transported as soon as floor can take reportRip Harbour, RN, CN informed of delay

## 2016-08-29 NOTE — ED Notes (Signed)
Call to floor - Per Josph Macho, RN unable to take report at t his time

## 2016-08-29 NOTE — H&P (Signed)
History and Physical    Nathaniel [name] LFY:[id number] DOB: 07-22-1986 DOA: 08/29/2016  PCP: Maricela Curet, MD   Patient coming from: Home.    Chief Complaint: abdominal cramps, nausea and vomiting.   HPI: Nathaniel [name] is an 30 y.o. male  with multiple medical problems including type 1 diabetes with  last admission for DKA  Sept 2016, presents to the emergency room with abdominal pain and pain nausea and vomiting and found to be in DKA with bicarbonate of 21. Further evaluation included negative UA , blood glucose greater than 700, creatinine of 2.07   and potassium of  5.5    . Patient is alert and oriented and able to give a good history. There has been no fever, chills, cough, dysuria. Hospitalist was asked to admit her for DKA.  He had not missed his insulin.  He lives at home with his mother, and has been on disability.  He does smoke cigarettes, and uses THC, but denied any alcohol use.   ED Course: He was started on IV Insulin, and given IVF.   Rewiew of Systems:  Constitutional: Negative for malaise, fever and chills. No significant weight loss or weight gain Eyes: Negative for eye pain, redness and discharge, diplopia, visual changes, or flashes of light. ENMT: Negative for ear pain, hoarseness, nasal congestion, sinus pressure and sore throat. No headaches; tinnitus, drooling, or problem swallowing. Cardiovascular: Negative for chest pain, palpitations, diaphoresis, dyspnea and peripheral edema. ; No orthopnea, PND Respiratory: Negative for cough, hemoptysis, wheezing and stridor. No pleuritic chestpain. Gastrointestinal: Negative for diarrhea, constipation,  melena, blood in stool, hematemesis, jaundice and rectal bleeding.    Genitourinary: Negative for frequency, dysuria, incontinence,flank pain and hematuria; Musculoskeletal: Negative for back pain and neck pain. Negative for swelling and trauma.;  Skin: . Negative for pruritus, rash, abrasions, bruising and  skin lesion.; ulcerations Neuro: Negative for headache, lightheadedness and neck stiffness. Negative for weakness, altered level of consciousness , altered mental status, extremity weakness, burning feet, involuntary movement, seizure and syncope.  Psych: negative for anxiety, depression, insomnia, tearfulness, panic attacks, hallucinations, paranoia, suicidal or homicidal ideation     Past Medical History:  Diagnosis Date  . Diabetes mellitus    lantus/novolog  . Hyperlipidemia   . Hypertension   . Marijuana abuse    occaisionally  . Noncompliance with medication regimen   . Tobacco abuse    5/day    Past Surgical History:  Procedure Laterality Date  . EYE SURGERY       reports that he has been smoking Cigarettes.  He has a 8.00 pack-year smoking history. He has never used smokeless tobacco. He reports that he uses drugs, including Marijuana. He reports that he does not drink alcohol.  Allergies  Allergen Reactions  . Orange     Increases blood sugar immediately     Family History  Problem Relation Age of Onset  . Diabetes Father   . Kidney failure Father     last 4 mnths of life  . Hypertension Mother      Prior to Admission medications   Medication Sig Start Date End Date Taking? Authorizing Provider  amLODipine (NORVASC) 5 MG tablet Take 1 tablet (5 mg total) by mouth daily. 04/17/16   [name], MD  LEVEMIR FLEXTOUCH 100 UNIT/ML Pen INJECT 30 UNITS SUBCUTNEOUSLY A.C. Bid , Breakfast and supper. Patient taking differently: Inject 30 Units into the skin 2 (two) times daily. INJECT [place]. Bid , Breakfast  and supper. 04/17/16   [name], MD  lisinopril (PRINIVIL,ZESTRIL) 10 MG tablet Take 1 tablet (10 mg total) by mouth daily. 04/04/15   [name], MD  pantoprazole (PROTONIX) 40 MG tablet Take 1 tablet (40 mg total) by mouth daily. 04/17/16   [name], MD  pravastatin (PRAVACHOL) 40 MG tablet Take 1 tablet (40 mg total)  by mouth daily at 6 PM. 06/10/15   [name], MD    Physical Exam: Vitals:   08/29/16 1831 08/29/16 2007  BP: (!) 209/119 175/97  Pulse: 110 104  Resp: 16 19  Temp: 97.7 F (36.5 C)   TempSrc: Oral   SpO2: 100% 99%  Weight: 68 kg (150 lb)   Height: 6\' 1"  (1.854 m)       Constitutional: NAD, calm, comfortable Vitals:   08/29/16 1831 08/29/16 2007  BP: (!) 209/119 175/97  Pulse: 110 104  Resp: 16 19  Temp: 97.7 F (36.5 C)   TempSrc: Oral   SpO2: 100% 99%  Weight: 68 kg (150 lb)   Height: 6\' 1"  (1.854 m)    Eyes: PERRL, lids and conjunctivae normal ENMT: Mucous membranes are moist. Posterior pharynx clear of any exudate or lesions.Normal dentition.  Neck: normal, supple, no masses, no thyromegaly Respiratory: clear to auscultation bilaterally, no wheezing, no crackles. Normal respiratory effort. No accessory muscle use.  Cardiovascular: Regular rate and rhythm, no murmurs / rubs / gallops. No extremity edema. 2+ pedal pulses. No carotid bruits.  Abdomen: no tenderness, no masses palpated. No hepatosplenomegaly. Bowel sounds positive.  Musculoskeletal: no clubbing / cyanosis. No joint deformity upper and lower extremities. Good ROM, no contractures. Normal muscle tone.  Skin: no rashes, lesions, ulcers. No induration Neurologic: CN 2-12 grossly intact. Sensation intact, DTR normal. Strength 5/5 in all 4.  Psychiatric: Normal judgment and insight. Alert and oriented x 3. Normal mood.    Labs on Admission: I have personally reviewed following labs and imaging studies CBC:  Recent Labs Lab 08/29/16 1850  WBC 19.3*  HGB 14.2  HCT 40.6  MCV 88.6  PLT 034   Basic Metabolic Panel:  Recent Labs Lab 08/29/16 1850  NA 130*  K 5.5*  CL 87*  CO2 21*  GLUCOSE 762*  BUN 34*  CREATININE 2.07*  CALCIUM 10.3   GFR: Estimated Creatinine Clearance: 50.2 mL/min (by C-G formula based on SCr of 2.07 mg/dL (H)). Liver Function Tests:  Recent Labs Lab  08/29/16 1850  AST 33  ALT 30  ALKPHOS 100  BILITOT 1.8*  PROT 9.1*  ALBUMIN 4.6    Recent Labs Lab 08/29/16 1850  LIPASE 26   CBG:  Recent Labs Lab 08/29/16 1834 08/29/16 2005  GLUCAP >600* >600*   Lipid Profile: Urine analysis:    Component Value Date/Time   COLORURINE STRAW (A) 08/29/2016 [place] 08/29/2016 1835   LABSPEC 1.021 08/29/2016 1835   PHURINE 5.0 08/29/2016 1835   GLUCOSEU >=500 (A) 08/29/2016 1835   HGBUR MODERATE (A) 08/29/2016 1835   BILIRUBINUR NEGATIVE 08/29/2016 1835   KETONESUR 20 (A) 08/29/2016 1835   PROTEINUR 100 (A) 08/29/2016 1835   UROBILINOGEN 0.2 06/07/2015 1815   NITRITE NEGATIVE 08/29/2016 [place] 08/29/2016 1835    Radiological Exams on Admission:  EKG: Independently reviewed.  Assessment/Plan Principal Problem:   DKA, type 1 (HCC) Active Problems:   Tobacco abuse   Marijuana abuse   Gastroparesis   Chronic hypertension    PLAN:  Will admit patient  to the SDU for DKA.  Will give IVF along with implementing the glucose stabalizer.   Will tx with analgesics and antiemetics. There is no evidence of infection.   Home medications will be continued except the home insulin since patient is on insulin drip.  Patient is stable, full code, and will be admitted to Dr Denita Lung service.    DVT prophylaxis: SubQ heparin.  Code Status: FULL CODE.  Family Communication: Disposition Plan: Mother at bedside.  Consults called: None.  Admission status: Inpatient.    Jarah Pember MD FACP. [facility]    If 7PM-7AM, please contact night-coverage [email or web address] Password [email or web address]  08/29/2016, 8:26 PM

## 2016-08-29 NOTE — ED Triage Notes (Signed)
PT c/o n/v/d that started today with n/v x5 and diarrhea x3 today.

## 2016-08-29 NOTE — ED Notes (Signed)
Insulin drip verified with Fritzi Mandes

## 2016-08-29 NOTE — ED Notes (Signed)
CRITICAL VALUE ALERT  Critical value received:  Glucose 762  Date of notification:  08/29/2016  Time of notification:  19:35  Critical value read back: yes  Nurse who received alert:  Rip Harbour RN   MD notified (1st page):  Dr Thurnell Garbe  Time of first page:  19:34  MD notified (2nd page):  Time of second page:  Responding MD:  Dr Thurnell Garbe  Time MD responded:  19:34

## 2016-08-29 NOTE — ED Notes (Signed)
Report to Josph Macho, RN, ICU

## 2016-08-30 LAB — BASIC METABOLIC PANEL
ANION GAP: 16 — AB (ref 5–15)
ANION GAP: 10 (ref 5–15)
ANION GAP: 12 (ref 5–15)
ANION GAP: 9 (ref 5–15)
Anion gap: 11 (ref 5–15)
BUN: 27 mg/dL — ABNORMAL HIGH (ref 6–20)
BUN: 27 mg/dL — ABNORMAL HIGH (ref 6–20)
BUN: 26 mg/dL — ABNORMAL HIGH (ref 6–20)
BUN: 23 mg/dL — ABNORMAL HIGH (ref 6–20)
BUN: 16 mg/dL (ref 6–20)
CALCIUM: 9.5 mg/dL (ref 8.9–10.3)
CALCIUM: 9.2 mg/dL (ref 8.9–10.3)
CALCIUM: 9.6 mg/dL (ref 8.9–10.3)
CALCIUM: 9.8 mg/dL (ref 8.9–10.3)
CALCIUM: 9.5 mg/dL (ref 8.9–10.3)
CO2: 22 mmol/L (ref 22–32)
CO2: 25 mmol/L (ref 22–32)
CO2: 25 mmol/L (ref 22–32)
CO2: 27 mmol/L (ref 22–32)
CO2: 27 mmol/L (ref 22–32)
CREATININE: 1.62 mg/dL — AB (ref 0.61–1.24)
CREATININE: 1.62 mg/dL — AB (ref 0.61–1.24)
CREATININE: 1.7 mg/dL — AB (ref 0.61–1.24)
CREATININE: 1.43 mg/dL — AB (ref 0.61–1.24)
Chloride: 95 mmol/L — ABNORMAL LOW (ref 101–111)
Chloride: 98 mmol/L — ABNORMAL LOW (ref 101–111)
Chloride: 97 mmol/L — ABNORMAL LOW (ref 101–111)
Chloride: 98 mmol/L — ABNORMAL LOW (ref 101–111)
Chloride: 98 mmol/L — ABNORMAL LOW (ref 101–111)
Creatinine, Ser: 1.26 mg/dL — ABNORMAL HIGH (ref 0.61–1.24)
GFR calc Af Amer: >60 mL/min (ref >60)
GFR, EST NON AFRICAN AMERICAN: 56 mL/min — AB (ref >60)
GFR, EST NON AFRICAN AMERICAN: 56 mL/min — AB (ref >60)
GFR, EST NON AFRICAN AMERICAN: 52 mL/min — AB (ref >60)
GLUCOSE: 124 mg/dL — AB (ref 65–99)
GLUCOSE: 185 mg/dL — AB (ref 65–99)
GLUCOSE: 63 mg/dL — AB (ref 65–99)
Glucose, Bld: 212 mg/dL — ABNORMAL HIGH (ref 65–99)
Glucose, Bld: 106 mg/dL — ABNORMAL HIGH (ref 65–99)
Potassium: 4 mmol/L (ref 3.5–5.1)
Potassium: 4.2 mmol/L (ref 3.5–5.1)
Potassium: 3.9 mmol/L (ref 3.5–5.1)
Potassium: 3.8 mmol/L (ref 3.5–5.1)
Potassium: 3.4 mmol/L — ABNORMAL LOW (ref 3.5–5.1)
SODIUM: 134 mmol/L — AB (ref 135–145)
Sodium: 133 mmol/L — ABNORMAL LOW (ref 135–145)
Sodium: 133 mmol/L — ABNORMAL LOW (ref 135–145)
Sodium: 134 mmol/L — ABNORMAL LOW (ref 135–145)
Sodium: 136 mmol/L (ref 135–145)

## 2016-08-30 LAB — GLUCOSE, CAPILLARY
GLUCOSE-CAPILLARY: 107 mg/dL — AB (ref 65–99)
GLUCOSE-CAPILLARY: 134 mg/dL — AB (ref 65–99)
GLUCOSE-CAPILLARY: 157 mg/dL — AB (ref 65–99)
GLUCOSE-CAPILLARY: 158 mg/dL — AB (ref 65–99)
GLUCOSE-CAPILLARY: 86 mg/dL (ref 65–99)
Glucose-Capillary: 107 mg/dL — ABNORMAL HIGH (ref 65–99)
Glucose-Capillary: 123 mg/dL — ABNORMAL HIGH (ref 65–99)
Glucose-Capillary: 123 mg/dL — ABNORMAL HIGH (ref 65–99)
Glucose-Capillary: 138 mg/dL — ABNORMAL HIGH (ref 65–99)
Glucose-Capillary: 179 mg/dL — ABNORMAL HIGH (ref 65–99)
Glucose-Capillary: 184 mg/dL — ABNORMAL HIGH (ref 65–99)
Glucose-Capillary: 42 mg/dL — CL (ref 65–99)
Glucose-Capillary: 44 mg/dL — CL (ref 65–99)
Glucose-Capillary: 56 mg/dL — ABNORMAL LOW (ref 65–99)

## 2016-08-30 LAB — MRSA PCR SCREENING: MRSA by PCR: NEGATIVE

## 2016-08-30 MED ORDER — INSULIN ASPART 100 UNIT/ML ~~LOC~~ SOLN
0.0000 [IU] | Freq: Three times a day (TID) | SUBCUTANEOUS | Status: DC
  Administered 2016-08-30: 3 [IU] via SUBCUTANEOUS
  Administered 2016-08-31: 15 [IU] via SUBCUTANEOUS

## 2016-08-30 MED ORDER — ONDANSETRON HCL 4 MG/2ML IJ SOLN
4.0000 mg | Freq: Four times a day (QID) | INTRAMUSCULAR | Status: DC | PRN
  Administered 2016-08-30: 4 mg via INTRAVENOUS
  Filled 2016-08-30: qty 2

## 2016-08-30 MED ORDER — INSULIN ASPART 100 UNIT/ML ~~LOC~~ SOLN
0.0000 [IU] | Freq: Three times a day (TID) | SUBCUTANEOUS | Status: DC
Start: 2016-08-30 — End: 2016-08-30

## 2016-08-30 MED ORDER — SODIUM CHLORIDE 0.9% FLUSH: 3.0000 mL | INTRAVENOUS | Status: DC | PRN

## 2016-08-30 MED ORDER — GLUCOSE 40 % PO GEL
1.0000 | Freq: Once | ORAL | Status: AC
  Administered 2016-08-30: 37.5 g via ORAL
  Filled 2016-08-30: qty 1

## 2016-08-30 MED ORDER — GLUCOSE 40 % PO GEL
ORAL | Status: DC
Start: 2016-08-30 — End: 2016-08-30
  Filled 2016-08-30: qty 1

## 2016-08-30 MED ORDER — DEXTROSE 50 % IV SOLN
25.0000 mL | Freq: Once | INTRAVENOUS | Status: AC
  Administered 2016-08-30: 25 mL via INTRAVENOUS

## 2016-08-30 MED ORDER — SODIUM CHLORIDE 0.9 % IV SOLN: 250.0000 mL | INTRAVENOUS | Status: DC | PRN

## 2016-08-30 MED ORDER — INSULIN DETEMIR 100 UNIT/ML ~~LOC~~ SOLN
30.0000 [IU] | Freq: Two times a day (BID) | SUBCUTANEOUS | Status: DC
  Administered 2016-08-30 – 2016-08-31 (×2): 30 [IU] via SUBCUTANEOUS
  Filled 2016-08-30 (×5): qty 0.3

## 2016-08-30 MED ORDER — DEXTROSE 50 % IV SOLN
INTRAVENOUS | Status: AC
  Administered 2016-08-30: 25 mL via INTRAVENOUS
  Filled 2016-08-30: qty 50

## 2016-08-30 MED ORDER — INSULIN ASPART 100 UNIT/ML ~~LOC~~ SOLN
3.0000 [IU] | Freq: Three times a day (TID) | SUBCUTANEOUS | Status: DC
  Administered 2016-08-31: 3 [IU] via SUBCUTANEOUS

## 2016-08-30 MED ORDER — CLONIDINE HCL 0.2 MG/24HR TD PTWK
0.2000 mg | MEDICATED_PATCH | TRANSDERMAL | Status: DC
  Administered 2016-08-30: 0.2 mg via TRANSDERMAL
  Filled 2016-08-30: qty 1

## 2016-08-30 MED ORDER — INSULIN ASPART 100 UNIT/ML ~~LOC~~ SOLN: 0.0000 [IU] | Freq: Every day | SUBCUTANEOUS | Status: DC

## 2016-08-30 MED ORDER — SODIUM CHLORIDE 0.9% FLUSH
3.0000 mL | Freq: Two times a day (BID) | INTRAVENOUS | Status: DC
  Administered 2016-08-30 – 2016-08-31 (×3): 3 mL via INTRAVENOUS

## 2016-08-30 NOTE — Progress Notes (Signed)
Hypoglycemic Event  CBG: 42  Treatment: Coke  Symptoms: Nausea  Follow-up CBG: Time:1210 CBG Result:44  Possible Reasons for Event: Poor po intake  Comments/MD notified: Dr. Truman Hayward, Simeon Craft

## 2016-08-30 NOTE — Progress Notes (Signed)
Acidosis has resolved anion gap is closed patient still with nausea not eating we'll switch to full liquid diet to begin L mentation glucoses 100 200 range now 47 give can of [employer] BP exceedingly high will add Catapres-TTS-2 in due to inability to take by mouth's Maisie Fus WVP:[id number] DOB: 05-01-1986 DOA: 08/29/2016 PCP: Maricela Curet, MD   Physical Exam: Blood pressure (!) 142/80, pulse (!) 124, temperature 98.4 F (36.9 C), temperature source Oral, resp. rate (!) 27, height 6\' 1"  (1.854 m), weight 65.9 kg (145 lb 4.5 oz), SpO2 99 %. Lungs clear to A&P no rales wheeze rhonchi heart regular rhythm no murmurs gallops heaves thrills rubs abdomen soft nontender bowel sounds normoactive   Investigations:  No results found for this or any previous visit (from the past 240 hour(s)).   Basic Metabolic Panel:  Recent Labs  08/30/16 0647 08/30/16 1041  NA 134* 136  K 3.9 3.8  CL 97* 98*  CO2 25 27  GLUCOSE 185* 63*  BUN 26* 23*  CREATININE 1.70* 1.43*  CALCIUM 9.6 9.8   Liver Function Tests:  Recent Labs  08/29/16 1850  AST 33  ALT 30  ALKPHOS 100  BILITOT 1.8*  PROT 9.1*  ALBUMIN 4.6     CBC:  Recent Labs  08/29/16 1850 08/29/16 2259  WBC 19.3* 16.8*  HGB 14.2 12.9*  HCT 40.6 36.6*  MCV 88.6 86.7  PLT 310 314    No results found.    Medications:   Impression: Gastroparesis  Principal Problem:   DKA, type 1 ([place]) Active Problems:   Tobacco abuse   Marijuana abuse   Gastroparesis   Chronic hypertension     Plan: Corene Cornea a liquid diet continue before meals and at bedtime glucoses sliding scale coverage at Catapres-TTS-2 4 accelerated hypertension  Consultants:    Procedures   Antibiotics:          Time spent: 30 minutes   LOS: 1 day   Nathaniel [name] M   08/30/2016, 11:58 AM

## 2016-08-30 NOTE — Progress Notes (Signed)
Hypoglycemic Event  CBG: 44  Treatment: Glucose gel  Symptoms: None  Follow-up CBG: Time:1240 CBG Result:56  Possible Reasons for Event:Poor po intake  Comments/MD notified:None  Nathaniel [name]

## 2016-08-30 NOTE — Progress Notes (Signed)
Inpatient Diabetes Program Recommendations  AACE/ADA: New Consensus Statement on Inpatient Glycemic Control (2015)  Target Ranges:  Prepandial:   less than 140 mg/dL      Peak postprandial:   less than 180 mg/dL (1-2 hours)      Critically ill patients:  140 - 180 mg/dL   Results for Nathaniel [name], Nathaniel [name] (MRN [id number]) as of 08/30/2016 08:01  Ref. Range 08/30/2016 00:44 08/30/2016 01:44 08/30/2016 02:45 08/30/2016 03:46 08/30/2016 04:46 08/30/2016 05:42 08/30/2016 06:41 08/30/2016 07:46  Glucose-Capillary Latest Ref Range: 65 - 99 mg/dL 184 (H) 138 (H) 107 (H) 107 (H) 134 (H) 157 (H) 179 (H) 158 (H)  Results for Nathaniel [name], Nathaniel [name] (MRN [id number]) as of 08/30/2016 08:01  Ref. Range 08/29/2016 18:34 08/29/2016 20:05 08/29/2016 21:06 08/29/2016 22:44 08/29/2016 23:48  Glucose-Capillary Latest Ref Range: 65 - 99 mg/dL >600 (HH) >600 (HH) 473 (H) 225 (H) 206 (H)   Review of Glycemic Control  Diabetes history: DM1 Outpatient Diabetes medications: Levemir 30 units BID Current orders for Inpatient glycemic control: Novolin R insulin drip, Levemir 30 units BID, Novolog 0-15 units TID with meals, Novolog 0-5 units QHS, Novolog 3 units TID with meals for meal coverage  Inpatient Diabetes Program Recommendations: Insulin - Basal: Anticipate ordered dose of Levemir 30 units BID is too much basal insulin since patient has Type 1 diabetes and is sensitive to insulin. Please decrease Levemir to 23 units daily (based on 65.9 kg x 0.35 units; starting 08/31/16 since patient received Levemir 30 units this morning at 5:43 am).  Correction (SSI): Please consider decreasing Novolog correction to sensitive scale since patient has Type 1 and is sensitive to insulin. Outpatient Referral: Recommend patient establish care with Endocrinologist to improve DM control.  NOTE: In reviewing the chart, noted patient was recently admitted from 07/19/16 to 07/22/16 for DKA. Patient experienced hypoglycemia on Lantus 20  units BID. Patient is noted to have Type 1 diabetes and requires basal, meal coverage, and correction insulin as he makes NO insulin at all. Per home medication list patient is only taking Levemir 30 units BID for DM control.  Patient needs to be taking Novolog or Humalog as an outpatient for meal coverage and for correction when needed. Recommend patient establish care with Endocrinologist to assist with improving DM control.  Thanks, Barnie Alderman, RN, MSN, CDE Diabetes Coordinator Inpatient Diabetes Program [phone] (Team Pager from 8am to 5pm)

## 2016-08-30 NOTE — Progress Notes (Signed)
Hypoglycemic Event  CBG: 56  Treatment: Dextrose 25 ml IV  Symptoms: None  Follow-up CBG: Time:1320 CBG Result:123  Possible Reasons for Event:Poor po intake  Comments/MD notified:None   Nathaniel [name]

## 2016-08-31 LAB — GLUCOSE, CAPILLARY
GLUCOSE-CAPILLARY: 153 mg/dL — AB (ref 65–99)
GLUCOSE-CAPILLARY: 399 mg/dL — AB (ref 65–99)
Glucose-Capillary: 109 mg/dL — ABNORMAL HIGH (ref 65–99)

## 2016-08-31 NOTE — Progress Notes (Signed)
Inpatient Diabetes Program Recommendations  AACE/ADA: New Consensus Statement on Inpatient Glycemic Control (2015)  Target Ranges:  Prepandial:   less than 140 mg/dL      Peak postprandial:   less than 180 mg/dL (1-2 hours)      Critically ill patients:  140 - 180 mg/dL   Results for Nathaniel [name], Nathaniel [name] (MRN [id number]) as of 08/31/2016 10:27  Ref. Range 08/30/2016 07:46 08/30/2016 11:44 08/30/2016 12:13 08/30/2016 12:41 08/30/2016 13:21 08/30/2016 16:11 08/30/2016 21:49  Glucose-Capillary Latest Ref Range: 65 - 99 mg/dL 158 (H) 42 (LL) 44 (LL) 56 (L) 123 (H) 123 (H) 86   Results for Nathaniel [name], Nathaniel [name] (MRN [id number]) as of 08/31/2016 10:27  Ref. Range 08/31/2016 02:38 08/31/2016 08:15  Glucose-Capillary Latest Ref Range: 65 - 99 mg/dL 153 (H) 399 (H)    Home DM Meds: Levemir 30 units BID  Current Insulin Orders: Levemir 30 units BID      Novolog Moderate Correction Scale/ SSI (0-15 units) TID AC + HS      Novolog 3 units TID with meals       -Patient received 30 units Levemir yesterday morning at 5am.  CBG by 12pm was down to 42 mg/dl.  -Levemir dose held last PM.  -CBG this AM extremely elevated to 399 mg/dl.     MD- Please consider the following in-hospital insulin adjustments:  1. Please decrease Levemir to 23 units daily (based on 65.9 kg x 0.35 units)  2. Please decrease Novolog Correction Scale/ SSI to Sensitive scale (0-9 units) TID AC + HS      --Will follow patient during hospitalization--  Wyn Quaker RN, MSN, CDE Diabetes Coordinator Inpatient Glycemic Control Team Team Pager: [phone] (8a-5p)

## 2016-08-31 NOTE — Discharge Summary (Signed)
Physician Discharge Summary  Nathaniel [name]:[id number] DOB: 12/15/1985 DOA: 08/29/2016  PCP: Maricela Curet, MD  Admit date: 08/29/2016 Discharge date: 08/31/2016   Recommendations for Outpatient Follow-up:  Is advised to take all home medicines prior to admission exactly as prescribed and to follow-up in my office within one week's time to assess electrolytes and renal function as well as hypertension and glycemic control Discharge Diagnoses:  Principal Problem:   DKA, type 1 ([place]) Active Problems:   Tobacco abuse   Marijuana abuse   Gastroparesis   Chronic hypertension   Discharge Condition: Good  Filed Weights   08/29/16 2234 08/30/16 0500 08/31/16 0500  Weight: 65.9 kg (145 lb 4.5 oz) 65.9 kg (145 lb 4.5 oz) 65.9 kg (145 lb 4.5 oz)    History of present illness:  Patient with type 1 diabetes was admitted with diabetic ketoacidosis increased anion gap and a glucose over 700 he had regular nausea vomiting he has chronic gastroparesis and vomiting and could not take by mouth's was given IV fluid resuscitation Glucomander protocol and over 24-hour period his acidosis resolved his gastroparesis was symptomatic on the second hospital day was given antibiotics and a clear liquid diet which she tolerated while this was advanced to full liquids which he tolerated well was subsequently discharged  Hospital Course:  See history of present illness above  Procedures:    Consultations:    Discharge Instructions  Discharge Instructions    Discharge instructions    Complete by:  As directed    Discharge patient    Complete by:  As directed        Medication List    TAKE these medications   amLODipine 5 MG tablet Commonly known as:  NORVASC Take 1 tablet (5 mg total) by mouth daily.   LEVEMIR FLEXTOUCH 100 UNIT/ML Pen Generic drug:  Insulin Detemir INJECT 30 UNITS SUBCUTNEOUSLY A.C. Bid , Breakfast and supper. What changed:  how much to take  how  to take this  when to take this  additional instructions   lisinopril 10 MG tablet Commonly known as:  PRINIVIL,ZESTRIL Take 1 tablet (10 mg total) by mouth daily.   pantoprazole 40 MG tablet Commonly known as:  PROTONIX Take 1 tablet (40 mg total) by mouth daily.   pravastatin 40 MG tablet Commonly known as:  PRAVACHOL Take 1 tablet (40 mg total) by mouth daily at 6 PM.      Allergies  Allergen Reactions  . Orange     Increases blood sugar immediately       The results of significant diagnostics from this hospitalization (including imaging, microbiology, ancillary and laboratory) are listed below for reference.    Significant Diagnostic Studies: No results found.  Microbiology: Recent Results (from the past 240 hour(s))  MRSA PCR Screening     Status: None   Collection Time: 08/30/16 12:04 AM  Result Value Ref Range Status   MRSA by PCR NEGATIVE NEGATIVE Final    Comment:        The GeneXpert MRSA Assay (FDA approved for NASAL specimens only), is one component of a comprehensive MRSA colonization surveillance program. It is not intended to diagnose MRSA infection nor to guide or monitor treatment for MRSA infections.      Labs: Basic Metabolic Panel:  Recent Labs Lab 08/29/16 2259 08/30/16 0251 08/30/16 0647 08/30/16 1041 08/30/16 2023  NA 133* 133* 134* 136 134*  K 4.0 4.2 3.9 3.8 3.4*  CL 95* 98* 97* 98*  98*  CO2 22 25 25 27 27   GLUCOSE 212* 124* 185* 63* 106*  BUN 27* 27* 26* 23* 16  CREATININE 1.62* 1.62* 1.70* 1.43* 1.26*  CALCIUM 9.5 9.2 9.6 9.8 9.5   Liver Function Tests:  Recent Labs Lab 08/29/16 1850  AST 33  ALT 30  ALKPHOS 100  BILITOT 1.8*  PROT 9.1*  ALBUMIN 4.6    Recent Labs Lab 08/29/16 1850  LIPASE 26   No results for input(s): AMMONIA in the last 168 hours. CBC:  Recent Labs Lab 08/29/16 1850 08/29/16 2259  WBC 19.3* 16.8*  HGB 14.2 12.9*  HCT 40.6 36.6*  MCV 88.6 86.7  PLT 310 314   Cardiac  Enzymes: No results for input(s): CKTOTAL, CKMB, CKMBINDEX, TROPONINI in the last 168 hours. BNP: BNP (last 3 results) No results for input(s): BNP in the last 8760 hours.  ProBNP (last 3 results) No results for input(s): PROBNP in the last 8760 hours.  CBG:  Recent Labs Lab 08/30/16 1611 08/30/16 2149 08/31/16 0238 08/31/16 0815 08/31/16 1122  GLUCAP 123* 86 153* 399* 109*       Signed:  Safari Cinque M  [facility] Pager: [phone] 08/31/2016, 1:13 PM

## 2016-08-31 NOTE — Care Management Important Message (Signed)
Important Message  Patient Details  Name: Nathaniel [name] MRN: [id number] Date of Birth: [birth year]   Medicare Important Message Given:  Yes    Sherald Barge, RN 08/31/2016, 4:07 PM

## 2016-08-31 NOTE — Care Management Note (Signed)
Case Management Note  Patient Details  Name: Nathaniel [name] MRN: [id number] Date of Birth: 12-24-1985  Subjective/Objective:                  Pt admitted with DKA. Chart reviewed for CM needs. He is from home, lives with family and is ind with ADL'. He has PCP, transportation and insurance with drug coverage. Pt has no HH or DME needs PTA.   Action/Plan: Pt discharged home today with self care. No CM needs anticipated.   Expected Discharge Date:     08/31/2016             Expected Discharge Plan:  Home/Self Care  In-House Referral:  NA  Discharge planning Services  CM Consult  Post Acute Care Choice:  NA Choice offered to:  NA  Status of Service:  Completed, signed off  Sherald Barge, RN 08/31/2016, 4:07 PM

## 2016-10-21 ENCOUNTER — Inpatient Hospital Stay (HOSPITAL_COMMUNITY)
Admission: EM | Admit: 2016-10-21 | Discharge: 2016-10-23 | DRG: 638 | Disposition: A | Discharge status: Home / Self Care | Payer: Medicare Other | Attending: Family Medicine | Admitting: Family Medicine

## 2016-10-21 ENCOUNTER — Encounter (HOSPITAL_COMMUNITY): Payer: Self-pay

## 2016-10-21 DIAGNOSIS — Z833 Family history of diabetes mellitus: Secondary | ICD-10-CM

## 2016-10-21 DIAGNOSIS — Z794 Long term (current) use of insulin: Secondary | ICD-10-CM

## 2016-10-21 DIAGNOSIS — E785 Hyperlipidemia, unspecified: Secondary | ICD-10-CM | POA: Diagnosis present

## 2016-10-21 DIAGNOSIS — F17210 Nicotine dependence, cigarettes, uncomplicated: Secondary | ICD-10-CM | POA: Diagnosis present

## 2016-10-21 DIAGNOSIS — F5089 Other specified eating disorder: Secondary | ICD-10-CM

## 2016-10-21 DIAGNOSIS — Z91018 Allergy to other foods: Secondary | ICD-10-CM | POA: Diagnosis not present

## 2016-10-21 DIAGNOSIS — F1210 Cannabis abuse, uncomplicated: Secondary | ICD-10-CM | POA: Diagnosis present

## 2016-10-21 DIAGNOSIS — Z9114 Patient's other noncompliance with medication regimen: Secondary | ICD-10-CM | POA: Diagnosis not present

## 2016-10-21 DIAGNOSIS — Z791 Long term (current) use of non-steroidal anti-inflammatories (NSAID): Secondary | ICD-10-CM | POA: Diagnosis not present

## 2016-10-21 DIAGNOSIS — E1010 Type 1 diabetes mellitus with ketoacidosis without coma: Principal | ICD-10-CM | POA: Diagnosis present

## 2016-10-21 DIAGNOSIS — R1110 Vomiting, unspecified: Secondary | ICD-10-CM | POA: Diagnosis not present

## 2016-10-21 DIAGNOSIS — I10 Essential (primary) hypertension: Secondary | ICD-10-CM | POA: Diagnosis present

## 2016-10-21 DIAGNOSIS — Z8249 Family history of ischemic heart disease and other diseases of the circulatory system: Secondary | ICD-10-CM | POA: Diagnosis not present

## 2016-10-21 DIAGNOSIS — E860 Dehydration: Secondary | ICD-10-CM

## 2016-10-21 DIAGNOSIS — N179 Acute kidney failure, unspecified: Secondary | ICD-10-CM | POA: Diagnosis present

## 2016-10-21 DIAGNOSIS — E1110 Type 2 diabetes mellitus with ketoacidosis without coma: Secondary | ICD-10-CM | POA: Diagnosis present

## 2016-10-21 DIAGNOSIS — T383X6A Underdosing of insulin and oral hypoglycemic [antidiabetic] drugs, initial encounter: Secondary | ICD-10-CM | POA: Diagnosis present

## 2016-10-21 DIAGNOSIS — Z720 Tobacco use: Secondary | ICD-10-CM

## 2016-10-21 DIAGNOSIS — Z841 Family history of disorders of kidney and ureter: Secondary | ICD-10-CM

## 2016-10-21 DIAGNOSIS — K3184 Gastroparesis: Secondary | ICD-10-CM

## 2016-10-21 DIAGNOSIS — E0810 Diabetes mellitus due to underlying condition with ketoacidosis without coma: Secondary | ICD-10-CM

## 2016-10-21 DIAGNOSIS — F17200 Nicotine dependence, unspecified, uncomplicated: Secondary | ICD-10-CM | POA: Diagnosis present

## 2016-10-21 LAB — COMPREHENSIVE METABOLIC PANEL
ALBUMIN: 4.3 g/dL (ref 3.5–5.0)
ALK PHOS: 99 U/L (ref 38–126)
ALT: 20 U/L (ref 17–63)
AST: 22 U/L (ref 15–41)
Anion gap: 25 — ABNORMAL HIGH (ref 5–15)
BUN: 30 mg/dL — AB (ref 6–20)
CALCIUM: 9.5 mg/dL (ref 8.9–10.3)
CO2: 10 mmol/L — AB (ref 22–32)
CREATININE: 1.84 mg/dL — AB (ref 0.61–1.24)
Chloride: 97 mmol/L — ABNORMAL LOW (ref 101–111)
GFR calc Af Amer: 55 mL/min — ABNORMAL LOW (ref >60)
GFR calc non Af Amer: 48 mL/min — ABNORMAL LOW (ref >60)
GLUCOSE: 552 mg/dL — AB (ref 65–99)
Potassium: 5.5 mmol/L — ABNORMAL HIGH (ref 3.5–5.1)
SODIUM: 132 mmol/L — AB (ref 135–145)
Total Bilirubin: 2 mg/dL — ABNORMAL HIGH (ref 0.3–1.2)
Total Protein: 8.5 g/dL — ABNORMAL HIGH (ref 6.5–8.1)

## 2016-10-21 LAB — CBC WITH DIFFERENTIAL/PLATELET
BASOS PCT: 0 %
Basophils Absolute: 0.1 10*3/uL (ref 0.0–0.1)
EOS ABS: 0 10*3/uL (ref 0.0–0.7)
EOS PCT: 0 %
HCT: 39.8 % (ref 39.0–52.0)
HEMOGLOBIN: 13.6 g/dL (ref 13.0–17.0)
Lymphocytes Relative: 10 %
Lymphs Abs: 1.6 10*3/uL (ref 0.7–4.0)
MCH: 30.8 pg (ref 26.0–34.0)
MCHC: 34.2 g/dL (ref 30.0–36.0)
MCV: 90.2 fL (ref 78.0–100.0)
MONO ABS: 0.4 10*3/uL (ref 0.1–1.0)
MONOS PCT: 2 %
Neutro Abs: 15.7 10*3/uL — ABNORMAL HIGH (ref 1.7–7.7)
Neutrophils Relative %: 88 %
Platelets: 312 10*3/uL (ref 150–400)
RBC: 4.41 MIL/uL (ref 4.22–5.81)
RDW: 13.7 % (ref 11.5–15.5)
WBC: 17.8 10*3/uL — ABNORMAL HIGH (ref 4.0–10.5)

## 2016-10-21 LAB — BASIC METABOLIC PANEL
ANION GAP: 23 — AB (ref 5–15)
BUN: 29 mg/dL — ABNORMAL HIGH (ref 6–20)
CHLORIDE: 104 mmol/L (ref 101–111)
CO2: 11 mmol/L — AB (ref 22–32)
Calcium: 9.2 mg/dL (ref 8.9–10.3)
Creatinine, Ser: 1.9 mg/dL — ABNORMAL HIGH (ref 0.61–1.24)
GFR calc non Af Amer: 46 mL/min — ABNORMAL LOW (ref >60)
GFR, EST AFRICAN AMERICAN: 53 mL/min — AB (ref >60)
Glucose, Bld: 504 mg/dL (ref 65–99)
POTASSIUM: 5.7 mmol/L — AB (ref 3.5–5.1)
SODIUM: 138 mmol/L (ref 135–145)

## 2016-10-21 LAB — URINALYSIS, ROUTINE W REFLEX MICROSCOPIC
BILIRUBIN URINE: NEGATIVE
Glucose, UA: >=500 mg/dL — AB
Ketones, ur: 80 mg/dL — AB
Leukocytes, UA: NEGATIVE
Nitrite: NEGATIVE
PH: 5 (ref 5.0–8.0)
Protein, ur: 100 mg/dL — AB
SPECIFIC GRAVITY, URINE: 1.017 (ref 1.005–1.030)

## 2016-10-21 LAB — BLOOD GAS, VENOUS
ACID-BASE EXCESS: 17.7 mmol/L — AB (ref 0.0–2.0)
Bicarbonate: 11.6 mmol/L — ABNORMAL LOW (ref 20.0–28.0)
O2 Saturation: 98.8 %
PH VEN: 7.207 — AB (ref 7.250–7.430)
pCO2, Ven: 22.8 mmHg — ABNORMAL LOW (ref 44.0–60.0)
pO2, Ven: 214 mmHg — ABNORMAL HIGH (ref 32.0–45.0)

## 2016-10-21 LAB — CBG MONITORING, ED
GLUCOSE-CAPILLARY: 559 mg/dL — AB (ref 65–99)
GLUCOSE-CAPILLARY: 507 mg/dL — AB (ref 65–99)
Glucose-Capillary: 568 mg/dL (ref 65–99)

## 2016-10-21 LAB — GLUCOSE, CAPILLARY: GLUCOSE-CAPILLARY: 411 mg/dL — AB (ref 65–99)

## 2016-10-21 MED ORDER — SODIUM CHLORIDE 0.9 % IV SOLN
INTRAVENOUS | Status: DC
  Administered 2016-10-21: 23:00:00 via INTRAVENOUS

## 2016-10-21 MED ORDER — DEXTROSE-NACL 5-0.45 % IV SOLN
INTRAVENOUS | Status: DC
  Administered 2016-10-22 – 2016-10-23 (×4): via INTRAVENOUS

## 2016-10-21 MED ORDER — NICOTINE 14 MG/24HR TD PT24
14.0000 mg | MEDICATED_PATCH | Freq: Every day | TRANSDERMAL | Status: DC
  Administered 2016-10-22 – 2016-10-23 (×2): 14 mg via TRANSDERMAL
  Filled 2016-10-21 (×2): qty 1

## 2016-10-21 MED ORDER — INSULIN REGULAR HUMAN 100 UNIT/ML IJ SOLN
INTRAMUSCULAR | Status: DC
  Administered 2016-10-21: 4.5 [IU]/h via INTRAVENOUS
  Filled 2016-10-21: qty 2.5

## 2016-10-21 MED ORDER — AMLODIPINE BESYLATE 5 MG PO TABS
5.0000 mg | ORAL_TABLET | Freq: Every day | ORAL | Status: DC
  Administered 2016-10-22 – 2016-10-23 (×2): 5 mg via ORAL
  Filled 2016-10-21 (×2): qty 1

## 2016-10-21 MED ORDER — SODIUM CHLORIDE 0.9 % IV SOLN: INTRAVENOUS | Status: DC

## 2016-10-21 MED ORDER — METOCLOPRAMIDE HCL 5 MG/ML IJ SOLN
10.0000 mg | Freq: Once | INTRAMUSCULAR | Status: AC
  Administered 2016-10-21: 10 mg via INTRAVENOUS
  Filled 2016-10-21: qty 2

## 2016-10-21 MED ORDER — SODIUM CHLORIDE 0.9 % IV SOLN
INTRAVENOUS | Status: DC
  Administered 2016-10-21: via INTRAVENOUS

## 2016-10-21 MED ORDER — DEXTROSE-NACL 5-0.45 % IV SOLN: INTRAVENOUS | Status: DC

## 2016-10-21 MED ORDER — PANTOPRAZOLE SODIUM 40 MG PO TBEC
40.0000 mg | DELAYED_RELEASE_TABLET | Freq: Every day | ORAL | Status: DC
  Administered 2016-10-22 – 2016-10-23 (×2): 40 mg via ORAL
  Filled 2016-10-21 (×2): qty 1

## 2016-10-21 MED ORDER — HYDRALAZINE HCL 20 MG/ML IJ SOLN
10.0000 mg | INTRAMUSCULAR | Status: DC | PRN
  Administered 2016-10-22 (×2): 10 mg via INTRAVENOUS
  Filled 2016-10-21 (×2): qty 1

## 2016-10-21 MED ORDER — ENOXAPARIN SODIUM 40 MG/0.4ML ~~LOC~~ SOLN
40.0000 mg | SUBCUTANEOUS | Status: DC
Start: 2016-10-21 — End: 2016-10-23
  Administered 2016-10-22 (×2): 40 mg via SUBCUTANEOUS
  Filled 2016-10-21 (×2): qty 0.4

## 2016-10-21 MED ORDER — PRAVASTATIN SODIUM 40 MG PO TABS
40.0000 mg | ORAL_TABLET | Freq: Every day | ORAL | Status: DC
  Administered 2016-10-22: 40 mg via ORAL
  Filled 2016-10-21: qty 1

## 2016-10-21 MED ORDER — SODIUM CHLORIDE 0.9 % IV SOLN
INTRAVENOUS | Status: AC
  Filled 2016-10-21: qty 2.5

## 2016-10-21 MED ORDER — SODIUM CHLORIDE 0.9 % IV SOLN
INTRAVENOUS | Status: DC
  Filled 2016-10-21: qty 2.5

## 2016-10-21 MED ORDER — SODIUM CHLORIDE 0.9 % IV BOLUS (SEPSIS)
2000.0000 mL | Freq: Once | INTRAVENOUS | Status: AC
  Administered 2016-10-21: 2000 mL via INTRAVENOUS

## 2016-10-21 NOTE — H&P (Signed)
History and Physical    Nathaniel [name] VXB:[id number] DOB: 1986/03/30 DOA: 10/21/2016  PCP: Maricela Curet, MD Consultants:  None Patient coming from: home - lives with mother and sister; NOK: mother  Chief Complaint: n/v/d  HPI: Nathaniel [name] is a 31 y.o. male with medical history significant of type 1 DM with multiple admissions for DKA presenting with vomiting and diarrhea.  Started about 12pm.  He was perfectly fine this AM.  No blood in emesis or stools.  Diarrhea x 5, vomiting TNTC.  Has not missed any insulin recently.  Unsure when he last checked his sugar.  Uncertain about last Z3A (he doesn't seem to know what this is).  Frequently comes to the hospital for DKA.  Diagnosed with type 1 DM at age 74.    ER visits/hospitalizations in the last year include:  Hospitalization for DKA 12/10-12/17 Hospitalization for DKA 10/30-11/2 ER visit for n/v, HTN on 05/14/16 Hospitalization for AKI, hyperglycemia from 7/27-29 Hospitalization for DKA from 4/16-18 - signed out [facility] ER visit for abdominal pain and uncontrolled DM 11/22/15   ED Course: In DKA, started on Glucose Stabilizer  Review of Systems: As per HPI; otherwise 10 point review of systems reviewed and negative.   Ambulatory Status:  ambulates without assistance  Past Medical History:  Diagnosis Date  . Diabetes mellitus    lantus/novolog  . Hyperlipidemia   . Hypertension   . Marijuana abuse    occaisionally  . Noncompliance with medication regimen   . Tobacco abuse    5/day    Past Surgical History:  Procedure Laterality Date  . EYE SURGERY      Social History   Social History  . Marital status: Single    Spouse name: N/A  . Number of children: N/A  . Years of education: N/A   Occupational History  . disabled    Social History Main Topics  . Smoking status: Current Every Day Smoker    Packs/day: 0.50    Years: 8.00    Types: Cigarettes  . Smokeless tobacco: Never Used  . Alcohol use No    . Drug use: Yes    Types: Marijuana     Comment: last use yesterday  . Sexual activity: Yes    Birth control/ protection: Condom   Other Topics Concern  . Not on file   Social History Narrative  . No narrative on file    Allergies  Allergen Reactions  . Orange     Increases blood sugar immediately     Family History  Problem Relation Age of Onset  . Diabetes Father   . Kidney failure Father     last 4 mnths of life  . Hypertension Mother     Prior to Admission medications   Medication Sig Start Date End Date Taking? Authorizing Provider  amLODipine (NORVASC) 5 MG tablet Take 1 tablet (5 mg total) by mouth daily. 04/17/16  Yes [name], MD  LEVEMIR FLEXTOUCH 100 UNIT/ML Pen INJECT 30 UNITS SUBCUTNEOUSLY A.C. Bid , Breakfast and supper. Patient taking differently: Inject 30 Units into the skin 2 (two) times daily. INJECT [place]. Bid , Breakfast and supper. 04/17/16  Yes [name], MD  lisinopril (PRINIVIL,ZESTRIL) 10 MG tablet Take 1 tablet (10 mg total) by mouth daily. 04/04/15  Yes [name], MD  pantoprazole (PROTONIX) 40 MG tablet Take 1 tablet (40 mg total) by mouth daily. 04/17/16  Yes [name], MD  pravastatin (PRAVACHOL) 40 MG tablet  Take 1 tablet (40 mg total) by mouth daily at 6 PM. 06/10/15  Yes [name], MD  naproxen (NAPROSYN) 500 MG tablet Take 500 mg by mouth 2 (two) times daily. 09/08/16   Historical Provider, MD    Physical Exam: Vitals:   10/21/16 2000 10/21/16 2108 10/21/16 2130 10/21/16 2200  BP: (!) 194/102 (!) 210/97 (!) 202/95 181/67  Pulse: 108 115 110 116  Resp: 20 20 22 23   Temp: 97.7 F (36.5 C)     TempSrc: Oral     SpO2: 100% 100% 100% 100%  Weight:      Height:         General:  Appears calm but uncomfortable Eyes:  PERRL, EOMI, normal lids, iris ENT:  grossly normal hearing, lips & tongue, mmm Neck:  no LAD, masses or thyromegaly Cardiovascular:  Tachycardia, 2/6 systolic  murmur, no /g. No LE edema.  Respiratory:  CTA bilaterally, no w/r/r. Normal respiratory effort. Abdomen:  soft, midepigastric TTP, nd, NABS Skin:  no rash or induration seen on limited exam Musculoskeletal:  grossly normal tone BUE/BLE, good ROM, no bony abnormality Psychiatric: flat affect, speech fluent and appropriate, AOx3 Neurologic:  CN 2-12 grossly intact, moves all extremities in coordinated fashion, sensation intact  Labs on Admission: I have personally reviewed following labs and imaging studies  CBC:  Recent Labs Lab 10/21/16 2044  WBC 17.8*  NEUTROABS 15.7*  HGB 13.6  HCT 39.8  MCV 90.2  PLT 161   Basic Metabolic Panel:  Recent Labs Lab 10/21/16 2044  NA 132*  K 5.5*  CL 97*  CO2 10*  GLUCOSE 552*  BUN 30*  CREATININE 1.84*  CALCIUM 9.5   GFR: Estimated Creatinine Clearance: 59.5 mL/min (by C-G formula based on SCr of 1.84 mg/dL (H)). Liver Function Tests:  Recent Labs Lab 10/21/16 2044  AST 22  ALT 20  ALKPHOS 99  BILITOT 2.0*  PROT 8.5*  ALBUMIN 4.3   No results for input(s): LIPASE, AMYLASE in the last 168 hours. No results for input(s): AMMONIA in the last 168 hours. Coagulation Profile: No results for input(s): INR, PROTIME in the last 168 hours. Cardiac Enzymes: No results for input(s): CKTOTAL, CKMB, CKMBINDEX, TROPONINI in the last 168 hours. BNP (last 3 results) No results for input(s): PROBNP in the last 8760 hours. HbA1C: No results for input(s): HGBA1C in the last 72 hours. CBG:  Recent Labs Lab 10/21/16 1959 10/21/16 2107  GLUCAP 568* 559*   Lipid Profile: No results for input(s): CHOL, HDL, LDLCALC, TRIG, CHOLHDL, LDLDIRECT in the last 72 hours. Thyroid Function Tests: No results for input(s): TSH, T4TOTAL, FREET4, T3FREE, THYROIDAB in the last 72 hours. Anemia Panel: No results for input(s): VITAMINB12, FOLATE, FERRITIN, TIBC, IRON, RETICCTPCT in the last 72 hours. Urine analysis:    Component Value Date/Time    COLORURINE COLORLESS (A) 10/21/2016 2018   APPEARANCEUR CLEAR 10/21/2016 2018   LABSPEC 1.017 10/21/2016 2018   PHURINE 5.0 10/21/2016 2018   GLUCOSEU >=500 (A) 10/21/2016 2018   HGBUR MODERATE (A) 10/21/2016 2018   BILIRUBINUR NEGATIVE 10/21/2016 2018   KETONESUR 80 (A) 10/21/2016 2018   PROTEINUR 100 (A) 10/21/2016 2018   UROBILINOGEN 0.2 06/07/2015 1815   NITRITE NEGATIVE 10/21/2016 2018   LEUKOCYTESUR NEGATIVE 10/21/2016 2018    Creatinine Clearance: Estimated Creatinine Clearance: 59.5 mL/min (by C-G formula based on SCr of 1.84 mg/dL (H)).  Sepsis Labs: @LABRCNTIP (procalcitonin:4,lacticidven:4) )No results found for this or any previous visit (from the past 240 hour(s)).  Radiological Exams on Admission: No results found.  EKG: not done  Assessment/Plan Principal Problem:   DM (diabetes mellitus) type 1, uncontrolled, with ketoacidosis ([place]) Active Problems:   Tobacco abuse   Marijuana abuse   Malignant hypertension   Acute renal failure (ARF) (H[facility])   Uncontrolled DM, now with DKA -Patient with poor baseline control -Glucose on presentation 552, anion gap 25 -Does not monitor home glucose, unaware of what A1c is -Possible gastroenteritis as source, although GI symptoms could also be sequalae -Ongoing tobacco and marijuana use are likely exacerbating the situation -Moderate DKA on admission based on pH 7.207, HCO3 11.6, patient alert and possibly a bit drowsy -Will admit to SDU with DKA protocol -Would recommend continuing insulin drip at least until morning regardless of rapidity of closure of gap and normalization of labs -K+ slightly increased at time of presentation but will need potassium supplementation added once <5 -IVF at 150 cc/hr, NS until glucose <250 and then decrease rate to 125 and change to D51/2NS  Acute renal failure -Creatinine 1.84, was 1.26 on 12/11 -Anticipate improvement with IVF, as this is likely due to dehydration in the setting of  marked hyperglycemia  Malignant HTN -Patient with marked elevation in BP -His BP appears to be similarly uncontrolled on most prior ER visits -He is at high risk for serious complications associated with his chronic poor control of both his HTN and his DM -Will hold ACE based on renal failure -Continue Norvasc and cover with hydralazine prn  Tobacco dependence -Encourage cessation.  This was discussed with the patient and should be reviewed on an ongoing basis.   -Patch ordered.  Marijuana abuse -This may be contributing to his non-compliance as well as his recurrent DKA -Cessation should be encouraged -UDS ordered   DVT prophylaxis:  Lovenox Code Status: Full - confirmed with patient Family Communication: None present Disposition Plan:  Home once clinically improved Consults called: None  Admission status: Admit - It is my clinical opinion that admission to INPATIENT is reasonable and necessary because this patient will require at least 2 midnights in the hospital to treat this condition based on the medical complexity of the problems presented.  Given the aforementioned information, the predictability of an adverse outcome is felt to be significant.    Karmen Bongo MD [facility]  If 7PM-7AM, please contact night-coverage [email or web address] Password [email or web address]  10/21/2016, 10:17 PM

## 2016-10-21 NOTE — ED Triage Notes (Signed)
Nausea, vomiting, and diarrhea since around noon today.  Having abdominal pain.

## 2016-10-21 NOTE — ED Provider Notes (Signed)
[facility] DEPT Provider Note   CSN: [id number] Arrival date & time: 10/21/16  1946   By signing my name below, I, Nathaniel [name], attest that this documentation has been prepared under the direction and in the presence of Nathaniel Dakin, MD . Electronically Signed: Neta [name], ED Scribe. 10/21/2016. 8:13 PM.   History   Chief Complaint Chief Complaint  Patient presents with  . Emesis    The history is provided by the patient. No language interpreter was used.   HPI Comments:  Nathaniel [name] is a 31 y.o. male who presents to the Emergency Department complaining of cramping, waxing and waning abdominal pain that began at noon today. He describes that pain as minimal at present. No alleviating factors noted. No aggravating factors. Other associated symptoms: Pt reports 8 or 9 episodes of nonbloody nonbilious emesis, and 3 episodes of nonbloody diarrhea. Pt complains of associated urinary frequency. No alleviating factors noted. Pt denies fever.    Past Medical History:  Diagnosis Date  . Diabetes mellitus    lantus/novolog  . Hyperlipidemia   . Hypertension   . Marijuana abuse    occaisionally  . Noncompliance with medication regimen   . Tobacco abuse    5/day    Patient Active Problem List   Diagnosis Date Noted  . Diabetic ketoacidosis without coma associated with type 1 diabetes mellitus ([place])   . Hyperbilirubinemia   . Hyponatremia 04/15/2016  . Type 1 diabetes mellitus with hyperosmolarity without nonketotic hyperglycemic hyperosmolar coma ([place]) 04/15/2016  . Hyperglycemia 04/15/2016  . AKI (acute kidney injury) ([place]) 01/04/2016  . Severe uncontrolled hypertension 01/04/2016  . Noncompliance with medications 01/04/2016  . DKA (diabetic ketoacidoses) ([place]) 06/07/2015  . Intractable nausea and vomiting 04/01/2015  . Gastroparesis 04/01/2015  . Type 1 diabetes mellitus with complication ([place]) 23/76/2831  . Chronic hypertension   . Nausea with  vomiting   . Abscess, gluteal, right 06/21/2014  . Nausea and vomiting 04/08/2013  . Hyperglycemia without ketosis 04/08/2013  . Essential hypertension, benign 04/08/2013  . Hypercalcemia 07/29/2011  . Vomiting 07/28/2011  . Leukocytosis 07/28/2011  . Hypokalemia 07/28/2011  . DKA, type 1 ([place]) 07/27/2011  . Dehydration 07/27/2011  . Tobacco abuse 07/27/2011  . Marijuana abuse 07/27/2011    Past Surgical History:  Procedure Laterality Date  . EYE SURGERY         Home Medications    Prior to Admission medications   Medication Sig Start Date End Date Taking? Authorizing Provider  amLODipine (NORVASC) 5 MG tablet Take 1 tablet (5 mg total) by mouth daily. 04/17/16   [name], MD  LEVEMIR FLEXTOUCH 100 UNIT/ML Pen INJECT 30 UNITS SUBCUTNEOUSLY A.C. Bid , Breakfast and supper. Patient taking differently: Inject 30 Units into the skin 2 (two) times daily. INJECT [place]. Bid , Breakfast and supper. 04/17/16   [name], MD  lisinopril (PRINIVIL,ZESTRIL) 10 MG tablet Take 1 tablet (10 mg total) by mouth daily. 04/04/15   [name], MD  pantoprazole (PROTONIX) 40 MG tablet Take 1 tablet (40 mg total) by mouth daily. 04/17/16   [name], MD  pravastatin (PRAVACHOL) 40 MG tablet Take 1 tablet (40 mg total) by mouth daily at 6 PM. 06/10/15   [name], MD    Family History Family History  Problem Relation Age of Onset  . Diabetes Father   . Kidney failure Father     last 4 mnths of life  . Hypertension Mother  Social History Social History  Substance Use Topics  . Smoking status: Current Every Day Smoker    Packs/day: 1.00    Years: 8.00    Types: Cigarettes  . Smokeless tobacco: Never Used  . Alcohol use No     Allergies   Orange   Review of Systems Review of Systems  Constitutional: Negative.  Negative for fever.  HENT: Negative.   Respiratory: Negative.   Cardiovascular: Negative.   Gastrointestinal:  Positive for abdominal pain, diarrhea and vomiting.  Genitourinary: Positive for frequency.  Musculoskeletal: Negative.   Skin: Negative.   Allergic/Immunologic: Positive for immunocompromised state.  Neurological: Negative.   Psychiatric/Behavioral: Negative.   All other systems reviewed and are negative.    Physical Exam Updated Vital Signs BP (!) 194/102 (BP Location: Right Arm)   Pulse 108   Temp 97.7 F (36.5 C) (Oral)   Resp 20   Ht 6\' 1"  (1.854 m)   Wt 158 lb (71.7 kg)   SpO2 100%   BMI 20.85 kg/m   Physical Exam  Constitutional: He is oriented to person, place, and time. He appears well-developed and well-nourished.  Moderately ill-appearing  HENT:  Head: Normocephalic and atraumatic.  Eyes: EOM are normal. Pupils are equal, round, and reactive to light.  Leukocytes membranes dry  Neck: Neck supple. No tracheal deviation present. No thyromegaly present.  Cardiovascular: Normal rate and regular rhythm.   No murmur heard. Pulmonary/Chest: Effort normal and breath sounds normal. No respiratory distress.  Abdominal: Soft. Bowel sounds are normal. He exhibits no distension. There is no tenderness.  Genitourinary: Penis normal.  Musculoskeletal: Normal range of motion. He exhibits no edema or tenderness.  Neurological: He is alert and oriented to person, place, and time. Coordination normal.  Skin: Skin is warm and dry. No rash noted.  Psychiatric: He has a normal mood and affect.  Nursing note and vitals reviewed.    ED Treatments / Results  DIAGNOSTIC STUDIES:  Oxygen Saturation is 100% on RA, normal by my interpretation.    COORDINATION OF CARE:  8:13 PM Discussed treatment plan with pt at bedside and pt agreed to plan.   Labs (all labs ordered are listed, but only abnormal results are displayed) Labs Reviewed  CBG MONITORING, ED - Abnormal; Notable for the following:       Result Value   Glucose-Capillary 568 (*)    All other components within normal  limits    EKG  EKG Interpretation None      Results for orders placed or performed during the hospital encounter of 10/21/16  Comprehensive metabolic panel  Result Value Ref Range   Sodium 132 (L) 135 - 145 mmol/L   Potassium 5.5 (H) 3.5 - 5.1 mmol/L   Chloride 97 (L) 101 - 111 mmol/L   CO2 10 (L) 22 - 32 mmol/L   Glucose, Bld 552 (HH) 65 - 99 mg/dL   BUN 30 (H) 6 - 20 mg/dL   Creatinine, Ser 1.84 (H) 0.61 - 1.24 mg/dL   Calcium 9.5 8.9 - 10.3 mg/dL   Total Protein 8.5 (H) 6.5 - 8.1 g/dL   Albumin 4.3 3.5 - 5.0 g/dL   AST 22 15 - 41 U/L   ALT 20 17 - 63 U/L   Alkaline Phosphatase 99 38 - 126 U/L   Total Bilirubin 2.0 (H) 0.3 - 1.2 mg/dL   GFR calc non Af Amer 48 (L) >60 mL/min   GFR calc Af Amer 55 (L) >60 mL/min   Anion  gap 25 (H) 5 - 15  CBC with Differential/Platelet  Result Value Ref Range   WBC 17.8 (H) 4.0 - 10.5 K/uL   RBC 4.41 4.22 - 5.81 MIL/uL   Hemoglobin 13.6 13.0 - 17.0 g/dL   HCT 39.8 39.0 - 52.0 %   MCV 90.2 78.0 - 100.0 fL   MCH 30.8 26.0 - 34.0 pg   MCHC 34.2 30.0 - 36.0 g/dL   RDW 13.7 11.5 - 15.5 %   Platelets 312 150 - 400 K/uL   Neutrophils Relative % 88 %   Neutro Abs 15.7 (H) 1.7 - 7.7 K/uL   Lymphocytes Relative 10 %   Lymphs Abs 1.6 0.7 - 4.0 K/uL   Monocytes Relative 2 %   Monocytes Absolute 0.4 0.1 - 1.0 K/uL   Eosinophils Relative 0 %   Eosinophils Absolute 0.0 0.0 - 0.7 K/uL   Basophils Relative 0 %   Basophils Absolute 0.1 0.0 - 0.1 K/uL   WBC Morphology ATYPICAL LYMPHOCYTES   Blood gas, venous  Result Value Ref Range   pH, Ven 7.207 (L) 7.250 - 7.430   pCO2, Ven 22.8 (L) 44.0 - 60.0 mmHg   pO2, Ven 214.0 (H) 32.0 - 45.0 mmHg   Bicarbonate 11.6 (L) 20.0 - 28.0 mmol/L   Acid-Base Excess 17.7 (H) 0.0 - 2.0 mmol/L   O2 Saturation 98.8 %   Drawn by COLLECTED BY LABORATORY    Sample type VEIN   Urinalysis, Routine w reflex microscopic  Result Value Ref Range   Color, Urine COLORLESS (A) YELLOW   APPearance CLEAR CLEAR    Specific Gravity, Urine 1.017 1.005 - 1.030   pH 5.0 5.0 - 8.0   Glucose, UA >=500 (A) NEGATIVE mg/dL   Hgb urine dipstick MODERATE (A) NEGATIVE   Bilirubin Urine NEGATIVE NEGATIVE   Ketones, ur 80 (A) NEGATIVE mg/dL   Protein, ur 100 (A) NEGATIVE mg/dL   Nitrite NEGATIVE NEGATIVE   Leukocytes, UA NEGATIVE NEGATIVE   RBC / HPF 6-30 0 - 5 RBC/hpf   WBC, UA 0-5 0 - 5 WBC/hpf   Bacteria, UA RARE (A) NONE SEEN  POC CBG, ED  Result Value Ref Range   Glucose-Capillary 568 (HH) 65 - 99 mg/dL  CBG monitoring, ED  Result Value Ref Range   Glucose-Capillary 559 (HH) 65 - 99 mg/dL   Comment 1 Notify RN    Comment 2 Document in Chart    No results found.  Radiology No results found.  Procedures Procedures (including critical care time)  Medications Ordered in ED Medications - No data to display   Initial Impression / Assessment and Plan / ED Course  I have reviewed the triage vital signs and the nursing notes.  Pertinent labs & imaging results that were available during my care of the patient were reviewed by me and considered in my medical decision making (see chart for details).     9:55 PM feels somewhat improved after treatment with intravenous fluids. Dr. Lorin Mercy consulted and will see patient in ED. Intravenous insulin drip ordered as well as intravenous fluids plan admit step down unit  Final Clinical Impressions(s) / ED Diagnoses  Diagnosis#1 diabetic ketoacidosis #2 acute kidney injury #3dehydration #4 vomiting and diarrhea Final diagnoses:  None   CRITICAL CARE Performed by: Nathaniel [name] Total critical care time: 30 minutes Critical care time was exclusive of separately billable procedures and treating other patients. Critical care was necessary to treat or prevent imminent or life-threatening deterioration. Critical care was time  spent personally by me on the following activities: development of treatment plan with patient and/or surrogate as well as nursing,  discussions with consultants, evaluation of patient's response to treatment, examination of patient, obtaining history from patient or surrogate, ordering and performing treatments and interventions, ordering and review of laboratory studies, ordering and review of radiographic studies, pulse oximetry and re-evaluation of patient's condition. New Prescriptions New Prescriptions   No medications on file  I personally performed the services described in this documentation, which was scribed in my presence. The recorded information has been reviewed and considered.     Nathaniel Dakin, MD 10/21/16 2202

## 2016-10-21 NOTE — ED Notes (Signed)
CRITICAL VALUE ALERT  Critical value received:  Glucose - 552  Date of notification:  10/21/2016  Time of notification:  2136  Critical value read back: yes  Nurse who received alert:  LJS  MD notified (1st page):  Dr Cathleen Fears  Time of first page:  2136  MD notified (2nd page):  Time of second page:  Responding MD:  Dr Cathleen Fears  Time MD responded:  2136

## 2016-10-22 LAB — BASIC METABOLIC PANEL
ANION GAP: 22 — AB (ref 5–15)
ANION GAP: 12 (ref 5–15)
ANION GAP: 17 — AB (ref 5–15)
Anion gap: 11 (ref 5–15)
BUN: 25 mg/dL — ABNORMAL HIGH (ref 6–20)
BUN: 25 mg/dL — ABNORMAL HIGH (ref 6–20)
BUN: 22 mg/dL — ABNORMAL HIGH (ref 6–20)
BUN: 21 mg/dL — AB (ref 6–20)
CALCIUM: 8.6 mg/dL — AB (ref 8.9–10.3)
CALCIUM: 8.9 mg/dL (ref 8.9–10.3)
CHLORIDE: 105 mmol/L (ref 101–111)
CO2: 11 mmol/L — ABNORMAL LOW (ref 22–32)
CO2: 18 mmol/L — ABNORMAL LOW (ref 22–32)
CO2: 16 mmol/L — ABNORMAL LOW (ref 22–32)
CO2: 20 mmol/L — AB (ref 22–32)
Calcium: 9.5 mg/dL (ref 8.9–10.3)
Calcium: 9 mg/dL (ref 8.9–10.3)
Chloride: 109 mmol/L (ref 101–111)
Chloride: 108 mmol/L (ref 101–111)
Chloride: 102 mmol/L (ref 101–111)
Creatinine, Ser: 1.67 mg/dL — ABNORMAL HIGH (ref 0.61–1.24)
Creatinine, Ser: 1.66 mg/dL — ABNORMAL HIGH (ref 0.61–1.24)
Creatinine, Ser: 1.57 mg/dL — ABNORMAL HIGH (ref 0.61–1.24)
Creatinine, Ser: 1.6 mg/dL — ABNORMAL HIGH (ref 0.61–1.24)
GFR calc Af Amer: >60 mL/min (ref >60)
GFR calc non Af Amer: 56 mL/min — ABNORMAL LOW (ref >60)
GFR, EST NON AFRICAN AMERICAN: 54 mL/min — AB (ref >60)
GFR, EST NON AFRICAN AMERICAN: 54 mL/min — AB (ref >60)
GFR, EST NON AFRICAN AMERICAN: 58 mL/min — AB (ref >60)
Glucose, Bld: 206 mg/dL — ABNORMAL HIGH (ref 65–99)
Glucose, Bld: 108 mg/dL — ABNORMAL HIGH (ref 65–99)
Glucose, Bld: 227 mg/dL — ABNORMAL HIGH (ref 65–99)
Glucose, Bld: 146 mg/dL — ABNORMAL HIGH (ref 65–99)
POTASSIUM: 4.6 mmol/L (ref 3.5–5.1)
POTASSIUM: 4.3 mmol/L (ref 3.5–5.1)
POTASSIUM: 4.2 mmol/L (ref 3.5–5.1)
POTASSIUM: 4.2 mmol/L (ref 3.5–5.1)
SODIUM: 142 mmol/L (ref 135–145)
SODIUM: 138 mmol/L (ref 135–145)
SODIUM: 135 mmol/L (ref 135–145)
Sodium: 136 mmol/L (ref 135–145)

## 2016-10-22 LAB — GLUCOSE, CAPILLARY
GLUCOSE-CAPILLARY: 106 mg/dL — AB (ref 65–99)
GLUCOSE-CAPILLARY: 113 mg/dL — AB (ref 65–99)
GLUCOSE-CAPILLARY: 115 mg/dL — AB (ref 65–99)
GLUCOSE-CAPILLARY: 152 mg/dL — AB (ref 65–99)
GLUCOSE-CAPILLARY: 174 mg/dL — AB (ref 65–99)
GLUCOSE-CAPILLARY: 224 mg/dL — AB (ref 65–99)
GLUCOSE-CAPILLARY: 255 mg/dL — AB (ref 65–99)
GLUCOSE-CAPILLARY: 57 mg/dL — AB (ref 65–99)
Glucose-Capillary: 182 mg/dL — ABNORMAL HIGH (ref 65–99)
Glucose-Capillary: 111 mg/dL — ABNORMAL HIGH (ref 65–99)
Glucose-Capillary: 143 mg/dL — ABNORMAL HIGH (ref 65–99)
Glucose-Capillary: 229 mg/dL — ABNORMAL HIGH (ref 65–99)
Glucose-Capillary: 57 mg/dL — ABNORMAL LOW (ref 65–99)

## 2016-10-22 LAB — MRSA PCR SCREENING: MRSA BY PCR: NEGATIVE

## 2016-10-22 LAB — RAPID URINE DRUG SCREEN, HOSP PERFORMED
AMPHETAMINES: NOT DETECTED
Barbiturates: NOT DETECTED
Benzodiazepines: NOT DETECTED
Cocaine: NOT DETECTED
OPIATES: NOT DETECTED
Tetrahydrocannabinol: POSITIVE — AB

## 2016-10-22 MED ORDER — INSULIN DETEMIR 100 UNIT/ML ~~LOC~~ SOLN
30.0000 [IU] | Freq: Two times a day (BID) | SUBCUTANEOUS | Status: DC
  Administered 2016-10-22 (×2): 30 [IU] via SUBCUTANEOUS
  Filled 2016-10-22 (×7): qty 0.3

## 2016-10-22 MED ORDER — ONDANSETRON HCL 4 MG/2ML IJ SOLN
4.0000 mg | Freq: Four times a day (QID) | INTRAMUSCULAR | Status: DC | PRN
  Administered 2016-10-22 (×2): 4 mg via INTRAVENOUS
  Filled 2016-10-22 (×2): qty 2

## 2016-10-22 MED ORDER — INSULIN ASPART 100 UNIT/ML ~~LOC~~ SOLN
0.0000 [IU] | Freq: Three times a day (TID) | SUBCUTANEOUS | Status: DC
  Administered 2016-10-22: 7 [IU] via SUBCUTANEOUS

## 2016-10-22 MED ORDER — METOPROLOL TARTRATE 25 MG PO TABS
25.0000 mg | ORAL_TABLET | Freq: Two times a day (BID) | ORAL | Status: DC
  Administered 2016-10-22 – 2016-10-23 (×3): 25 mg via ORAL
  Filled 2016-10-22 (×3): qty 1

## 2016-10-22 MED ORDER — ORAL CARE MOUTH RINSE
15.0000 mL | Freq: Two times a day (BID) | OROMUCOSAL | Status: DC
  Administered 2016-10-22 – 2016-10-23 (×3): 15 mL via OROMUCOSAL

## 2016-10-22 NOTE — Care Management Note (Signed)
Case Management Note  Patient Details  Name: Nathaniel [name] MRN: [id number] Date of Birth: 1985-09-28  Subjective/Objective:      Chart reviewed for needs. Patient is from home with parents, ind with ADL's. No HH/DME PTA.               Action/Plan: Anticipate DC home with self care.   Expected Discharge Date:       10/23/2016          Expected Discharge Plan:  Home/Self Care  In-House Referral:  NA  Discharge planning Services  CM Consult  Post Acute Care Choice:  NA Choice offered to:  NA  DME Arranged:    DME Agency:     HH Arranged:    HH Agency:     Status of Service:  Completed, signed off  If discussed at [employer] of Stay Meetings, dates discussed:    Additional Comments:  Alicia Seib, Chauncey [place], RN 10/22/2016, 3:49 PM

## 2016-10-22 NOTE — Progress Notes (Addendum)
Inpatient Diabetes Program Recommendations  AACE/ADA: New Consensus Statement on Inpatient Glycemic Control (2015)  Target Ranges:  Prepandial:   less than 140 mg/dL      Peak postprandial:   less than 180 mg/dL (1-2 hours)      Critically ill patients:  140 - 180 mg/dL   Results for Nathaniel [name], Nathaniel [name] (MRN [id number]) as of 10/22/2016 11:21  Ref. Range 10/21/2016 20:44  Sodium Latest Ref Range: 135 - 145 mmol/L 132 (L)  Potassium Latest Ref Range: 3.5 - 5.1 mmol/L 5.5 (H)  Chloride Latest Ref Range: 101 - 111 mmol/L 97 (L)  CO2 Latest Ref Range: 22 - 32 mmol/L 10 (L)  Glucose Latest Ref Range: 65 - 99 mg/dL 552 (HH)  BUN Latest Ref Range: 6 - 20 mg/dL 30 (H)  Creatinine Latest Ref Range: 0.61 - 1.24 mg/dL 1.84 (H)  Calcium Latest Ref Range: 8.9 - 10.3 mg/dL 9.5  Anion gap Latest Ref Range: 5 - 15  25 (H)    Admit with: DKA  History: Type 1 DM  Home DM Meds: Levemir 30 units BID  Current Insulin Orders: Levemir 30 units BID      Novolog Resistant Correction Scale/ SSI (0-20 units) TID AC       -Transitioned off the IV Insulin drip this AM.  Was given 30 units Levemir at 11am today.  IV Insulin drip stopped at 9:30am prior to Levemir being given.  -Of note, patient was admitted to hospital 4 times in 2017 for DKA.  -Per Record Review, note that DM Coordinator spoke with pt at great length back on 01/05/16 (see notes for conversation between pt and DM Coordinator).  -DM Coordinator also encouraged pt to re-establish care under an Endocrinologist back in April, however, pt refused stating that Endocrinologists "fuss too much about blood sugars".        MD- Patient likely needs Novolog/Humalog SSI regimen for home given he has Type 1 DM.  Not sure how willing pt would be to taking 4 shots of insulin per day.  Patient needs to be seen by an Endocrinologist given his poor control and his age, however, I am unsure he is willing to do this either as he has refused to see an  Endocrinologist in the past.     --Will follow patient during hospitalization--  Wyn Quaker RN, MSN, CDE Diabetes Coordinator Inpatient Glycemic Control Team Team Pager: [phone] (8a-5p)

## 2016-10-22 NOTE — Progress Notes (Signed)
Anion gap is narrowed off of insulin infusion currently back on Levemir 30 before meals twice a day his normal outpatient dosage with car modified diet will continue before meals and at bedtime coverage sliding scale MARTY UY HWT:[id number] DOB: 03-27-1986 DOA: 10/21/2016 PCP: Maricela Curet, MD   Physical Exam: Blood pressure (!) 169/93, pulse 115, temperature 99.1 F (37.3 C), temperature source Oral, resp. rate 19, height '6\' 1"'  (1.854 m), weight 65 kg (143 lb 4.8 oz), SpO2 100 %. Lungs clear to A&P no rales wheeze rhonchi heart regular rhythm no S3 or S4 no heaves or rubs   Investigations:  Recent Results (from the past 240 hour(s))  MRSA PCR Screening     Status: None   Collection Time: 10/21/16 10:51 PM  Result Value Ref Range Status   MRSA by PCR NEGATIVE NEGATIVE Final    Comment:        The GeneXpert MRSA Assay (FDA approved for NASAL specimens only), is one component of a comprehensive MRSA colonization surveillance program. It is not intended to diagnose MRSA infection nor to guide or monitor treatment for MRSA infections.      Basic Metabolic Panel:  Recent Labs  10/22/16 0243 10/22/16 0641  NA 142 138  K 4.6 4.3  CL 109 108  CO2 11* 18*  GLUCOSE 206* 108*  BUN 25* 25*  CREATININE 1.67* 1.66*  CALCIUM 9.5 8.6*   Liver Function Tests:  Recent Labs  10/21/16 2044  AST 22  ALT 20  ALKPHOS 99  BILITOT 2.0*  PROT 8.5*  ALBUMIN 4.3     CBC:  Recent Labs  10/21/16 2044  WBC 17.8*  NEUTROABS 15.7*  HGB 13.6  HCT 39.8  MCV 90.2  PLT 312    No results found.    Medications:   Impression:  Principal Problem:   DM (diabetes mellitus) type 1, uncontrolled, with ketoacidosis ([place]) Active Problems:   Tobacco abuse   Marijuana abuse   Malignant hypertension   Acute renal failure (ARF) ([place])     Plan: Be met in a.m. Levemir 30 before meals twice a day continue sliding scale coverage    Consultants:    Procedures   Antibiotics:   Time spent: 30 minutes   LOS: 1 day   Oluchi Pucci M   10/22/2016, 11:49 AM

## 2016-10-22 NOTE — Progress Notes (Addendum)
CBG check @2241  was 57. Gave pt 2 orange juices as he says this will be the best technique to bring his sugar up a little bit. Will recheck in 15 minutes. No acute s/s of symptomatic hypoglycemia, will continue to monitior pt for any changes.  Ericka Pontiff, RN 10:53 PM 10/22/16  Pt CBG recheck was the same 57. Gave another juice and chocolate milk and he stated it may take a little longer for his sugar to come up and wants me to recheck in a half hour or so.    Ericka Pontiff, RN  11:44 PM  10/22/16   Pt CBG rechecks were 98 and 108.Will continue to monitor pt through out the night. Offered the pt something to eat but he refused and did not eat his dinner. He mentioned his stomach hurting if he ate anything.

## 2016-10-22 NOTE — Progress Notes (Signed)
0813 Last 4 CBGs 152, 113, 106, 111; CO2 18, Anion Gap 12. Insulin gtt stopped per glucose stabilizer.

## 2016-10-23 LAB — GLUCOSE, CAPILLARY
GLUCOSE-CAPILLARY: 109 mg/dL — AB (ref 65–99)
GLUCOSE-CAPILLARY: 74 mg/dL (ref 65–99)
GLUCOSE-CAPILLARY: 98 mg/dL (ref 65–99)
Glucose-Capillary: 108 mg/dL — ABNORMAL HIGH (ref 65–99)
Glucose-Capillary: 77 mg/dL (ref 65–99)

## 2016-10-23 LAB — BASIC METABOLIC PANEL
ANION GAP: 10 (ref 5–15)
BUN: 12 mg/dL (ref 6–20)
CALCIUM: 8.6 mg/dL — AB (ref 8.9–10.3)
CO2: 19 mmol/L — ABNORMAL LOW (ref 22–32)
Chloride: 105 mmol/L (ref 101–111)
Creatinine, Ser: 1.09 mg/dL (ref 0.61–1.24)
Glucose, Bld: 84 mg/dL (ref 65–99)
POTASSIUM: 3.7 mmol/L (ref 3.5–5.1)
Sodium: 134 mmol/L — ABNORMAL LOW (ref 135–145)

## 2016-10-23 LAB — HEMOGLOBIN A1C
HEMOGLOBIN A1C: 10.3 % — AB (ref 4.8–5.6)
MEAN PLASMA GLUCOSE: 249 mg/dL

## 2016-10-23 NOTE — Discharge Summary (Signed)
Physician Discharge Summary  Nathaniel [name] NKN:[id number] DOB: 1986-07-09 DOA: 10/21/2016  PCP: Maricela Curet, MD  Admit date: 10/21/2016 Discharge date: 10/23/2016   Recommendations for Outpatient Follow-up:  Patient is a 5 cigarette home and take his Levemir 30 units before meals twice a day and monitor his blood sugars twice a day and follow-up my office within one week's time Discharge Diagnoses:  Principal Problem:   DM (diabetes mellitus) type 1, uncontrolled, with ketoacidosis ([place]) Active Problems:   Tobacco abuse   Marijuana abuse   Malignant hypertension   Acute renal failure (ARF) ([place])   Discharge Condition: Good  Filed Weights   10/21/16 2305 10/22/16 0500 10/23/16 0400  Weight: 65 kg (143 lb 4.8 oz) 65 kg (143 lb 4.8 oz) 67.2 kg (148 lb 2.4 oz)    History of present illness:  Patient is 31 year old male with chronic noncompliance type I juvenile diabetes presented with DKA due to failure to take his insulin was admitted to ICU placed and Glucomander protocol and given aggressive IV fluid resuscitation for intravascular volume depletion is NA gap had resolved within 12-14 hours he was switched over to his oral Levemir long-acting insulin continued of good glycemic control he had diminished appetite for second hospital day but then began to eat in the normal fashion by the second evening he was subtotally discharged after 72 hours in hospital to resume his regular insulin regimen  Hospital Course:  See history of present illness above  Procedures:    Consultations:    Discharge Instructions    Allergies  Allergen Reactions  . Orange     Increases blood sugar immediately       The results of significant diagnostics from this hospitalization (including imaging, microbiology, ancillary and laboratory) are listed below for reference.    Significant Diagnostic Studies: No results found.  Microbiology: Recent Results (from the past 240  hour(s))  MRSA PCR Screening     Status: None   Collection Time: 10/21/16 10:51 PM  Result Value Ref Range Status   MRSA by PCR NEGATIVE NEGATIVE Final    Comment:        The GeneXpert MRSA Assay (FDA approved for NASAL specimens only), is one component of a comprehensive MRSA colonization surveillance program. It is not intended to diagnose MRSA infection nor to guide or monitor treatment for MRSA infections.      Labs: Basic Metabolic Panel:  Recent Labs Lab 10/22/16 0243 10/22/16 0641 10/22/16 1152 10/22/16 1426 10/23/16 0448  NA 142 138 135 136 134*  K 4.6 4.3 4.2 4.2 3.7  CL 109 108 102 105 105  CO2 11* 18* 16* 20* 19*  GLUCOSE 206* 108* 227* 146* 84  BUN 25* 25* 22* 21* 12  CREATININE 1.67* 1.66* 1.57* 1.60* 1.09  CALCIUM 9.5 8.6* 8.9 9.0 8.6*   Liver Function Tests:  Recent Labs Lab 10/21/16 2044  AST 22  ALT 20  ALKPHOS 99  BILITOT 2.0*  PROT 8.5*  ALBUMIN 4.3   No results for input(s): LIPASE, AMYLASE in the last 168 hours. No results for input(s): AMMONIA in the last 168 hours. CBC:  Recent Labs Lab 10/21/16 2044  WBC 17.8*  NEUTROABS 15.7*  HGB 13.6  HCT 39.8  MCV 90.2  PLT 312   Cardiac Enzymes: No results for input(s): CKTOTAL, CKMB, CKMBINDEX, TROPONINI in the last 168 hours. BNP: BNP (last 3 results) No results for input(s): BNP in the last 8760 hours.  ProBNP (last 3 results)  No results for input(s): PROBNP in the last 8760 hours.  CBG:  Recent Labs Lab 10/22/16 2315 10/23/16 0011 10/23/16 0210 10/23/16 0338 10/23/16 0721  GLUCAP 57* 74 98 108* 77       Signed:  [facility] Pager: [phone] 10/23/2016, 10:33 AM

## 2016-10-23 NOTE — Progress Notes (Signed)
0837 patient continues to c/o nausea and had an episode of diarrhea on night shift early this morning. Patient encouraged to eat breakfast but refuses at this time stating "I'm just not hungry right now." Levemir 30 units not given at this time d/t very poor PO intake and patient refusal. MD notified. Will continue to monitor.

## 2016-10-23 NOTE — Progress Notes (Signed)
1102 d/c instructions and paperwork given to patient. IV catheter removed from patient's LEFT hand, intact w/no s/s of infection/infiltration noted. Monitor wiring removed from patient. Patient waiting on ride home at this time.

## 2018-02-16 ENCOUNTER — Inpatient Hospital Stay (HOSPITAL_COMMUNITY)
Admission: EM | Admit: 2018-02-16 | Discharge: 2018-02-22 | DRG: 637 | Disposition: A | Discharge status: Rehabilitation Facility | Payer: Medicare HMO | Attending: Family Medicine | Admitting: Family Medicine

## 2018-02-16 ENCOUNTER — Inpatient Hospital Stay (HOSPITAL_COMMUNITY): Payer: Medicare HMO

## 2018-02-16 ENCOUNTER — Encounter (HOSPITAL_COMMUNITY): Payer: Self-pay

## 2018-02-16 ENCOUNTER — Emergency Department (HOSPITAL_COMMUNITY): Payer: Medicare HMO

## 2018-02-16 DIAGNOSIS — Z91018 Allergy to other foods: Secondary | ICD-10-CM | POA: Diagnosis not present

## 2018-02-16 DIAGNOSIS — E871 Hypo-osmolality and hyponatremia: Secondary | ICD-10-CM | POA: Diagnosis present

## 2018-02-16 DIAGNOSIS — E108 Type 1 diabetes mellitus with unspecified complications: Secondary | ICD-10-CM | POA: Diagnosis present

## 2018-02-16 DIAGNOSIS — Z833 Family history of diabetes mellitus: Secondary | ICD-10-CM | POA: Diagnosis not present

## 2018-02-16 DIAGNOSIS — E1040 Type 1 diabetes mellitus with diabetic neuropathy, unspecified: Secondary | ICD-10-CM | POA: Diagnosis present

## 2018-02-16 DIAGNOSIS — E1110 Type 2 diabetes mellitus with ketoacidosis without coma: Secondary | ICD-10-CM | POA: Diagnosis present

## 2018-02-16 DIAGNOSIS — E785 Hyperlipidemia, unspecified: Secondary | ICD-10-CM

## 2018-02-16 DIAGNOSIS — I63013 Cerebral infarction due to thrombosis of bilateral vertebral arteries: Secondary | ICD-10-CM | POA: Diagnosis not present

## 2018-02-16 DIAGNOSIS — R29703 NIHSS score 3: Secondary | ICD-10-CM | POA: Diagnosis present

## 2018-02-16 DIAGNOSIS — Z8249 Family history of ischemic heart disease and other diseases of the circulatory system: Secondary | ICD-10-CM | POA: Diagnosis not present

## 2018-02-16 DIAGNOSIS — I10 Essential (primary) hypertension: Secondary | ICD-10-CM | POA: Diagnosis present

## 2018-02-16 DIAGNOSIS — E1051 Type 1 diabetes mellitus with diabetic peripheral angiopathy without gangrene: Secondary | ICD-10-CM | POA: Diagnosis not present

## 2018-02-16 DIAGNOSIS — Z9114 Patient's other noncompliance with medication regimen: Secondary | ICD-10-CM | POA: Diagnosis not present

## 2018-02-16 DIAGNOSIS — A530 Latent syphilis, unspecified as early or late: Secondary | ICD-10-CM | POA: Diagnosis not present

## 2018-02-16 DIAGNOSIS — Z8673 Personal history of transient ischemic attack (TIA), and cerebral infarction without residual deficits: Secondary | ICD-10-CM | POA: Diagnosis not present

## 2018-02-16 DIAGNOSIS — K3184 Gastroparesis: Secondary | ICD-10-CM

## 2018-02-16 DIAGNOSIS — A539 Syphilis, unspecified: Secondary | ICD-10-CM | POA: Diagnosis not present

## 2018-02-16 DIAGNOSIS — I6389 Other cerebral infarction: Secondary | ICD-10-CM | POA: Diagnosis present

## 2018-02-16 DIAGNOSIS — F1210 Cannabis abuse, uncomplicated: Secondary | ICD-10-CM

## 2018-02-16 DIAGNOSIS — R269 Unspecified abnormalities of gait and mobility: Secondary | ICD-10-CM | POA: Diagnosis not present

## 2018-02-16 DIAGNOSIS — Z79899 Other long term (current) drug therapy: Secondary | ICD-10-CM | POA: Diagnosis not present

## 2018-02-16 DIAGNOSIS — N183 Chronic kidney disease, stage 3 (moderate): Secondary | ICD-10-CM | POA: Diagnosis not present

## 2018-02-16 DIAGNOSIS — D72829 Elevated white blood cell count, unspecified: Secondary | ICD-10-CM | POA: Diagnosis not present

## 2018-02-16 DIAGNOSIS — E119 Type 2 diabetes mellitus without complications: Secondary | ICD-10-CM | POA: Diagnosis not present

## 2018-02-16 DIAGNOSIS — E1010 Type 1 diabetes mellitus with ketoacidosis without coma: Principal | ICD-10-CM | POA: Diagnosis present

## 2018-02-16 DIAGNOSIS — I6381 Other cerebral infarction due to occlusion or stenosis of small artery: Secondary | ICD-10-CM | POA: Diagnosis not present

## 2018-02-16 DIAGNOSIS — I63512 Cerebral infarction due to unspecified occlusion or stenosis of left middle cerebral artery: Secondary | ICD-10-CM | POA: Diagnosis not present

## 2018-02-16 DIAGNOSIS — I69398 Other sequelae of cerebral infarction: Secondary | ICD-10-CM | POA: Diagnosis not present

## 2018-02-16 DIAGNOSIS — F17210 Nicotine dependence, cigarettes, uncomplicated: Secondary | ICD-10-CM | POA: Diagnosis present

## 2018-02-16 DIAGNOSIS — E162 Hypoglycemia, unspecified: Secondary | ICD-10-CM | POA: Diagnosis not present

## 2018-02-16 DIAGNOSIS — Z9119 Patient's noncompliance with other medical treatment and regimen: Secondary | ICD-10-CM

## 2018-02-16 DIAGNOSIS — I639 Cerebral infarction, unspecified: Secondary | ICD-10-CM | POA: Diagnosis not present

## 2018-02-16 DIAGNOSIS — N179 Acute kidney failure, unspecified: Secondary | ICD-10-CM | POA: Diagnosis present

## 2018-02-16 DIAGNOSIS — E875 Hyperkalemia: Secondary | ICD-10-CM | POA: Diagnosis present

## 2018-02-16 DIAGNOSIS — E0810 Diabetes mellitus due to underlying condition with ketoacidosis without coma: Secondary | ICD-10-CM

## 2018-02-16 DIAGNOSIS — I69351 Hemiplegia and hemiparesis following cerebral infarction affecting right dominant side: Secondary | ICD-10-CM | POA: Diagnosis not present

## 2018-02-16 DIAGNOSIS — I6340 Cerebral infarction due to embolism of unspecified cerebral artery: Secondary | ICD-10-CM | POA: Diagnosis not present

## 2018-02-16 DIAGNOSIS — E782 Mixed hyperlipidemia: Secondary | ICD-10-CM | POA: Diagnosis not present

## 2018-02-16 DIAGNOSIS — Z23 Encounter for immunization: Secondary | ICD-10-CM

## 2018-02-16 DIAGNOSIS — Z841 Family history of disorders of kidney and ureter: Secondary | ICD-10-CM | POA: Diagnosis not present

## 2018-02-16 DIAGNOSIS — R739 Hyperglycemia, unspecified: Secondary | ICD-10-CM | POA: Diagnosis not present

## 2018-02-16 DIAGNOSIS — E1065 Type 1 diabetes mellitus with hyperglycemia: Secondary | ICD-10-CM | POA: Diagnosis not present

## 2018-02-16 DIAGNOSIS — A523 Neurosyphilis, unspecified: Secondary | ICD-10-CM | POA: Diagnosis not present

## 2018-02-16 DIAGNOSIS — F17200 Nicotine dependence, unspecified, uncomplicated: Secondary | ICD-10-CM | POA: Diagnosis not present

## 2018-02-16 LAB — APTT: APTT: 30 s (ref 24–36)

## 2018-02-16 LAB — URINALYSIS, ROUTINE W REFLEX MICROSCOPIC
Bacteria, UA: NONE SEEN
Bilirubin Urine: NEGATIVE
KETONES UR: 20 mg/dL — AB
Leukocytes, UA: NEGATIVE
NITRITE: NEGATIVE
PH: 5 (ref 5.0–8.0)
Protein, ur: 100 mg/dL — AB
SPECIFIC GRAVITY, URINE: 1.016 (ref 1.005–1.030)

## 2018-02-16 LAB — CBC WITH DIFFERENTIAL/PLATELET
BASOS ABS: 0.1 10*3/uL (ref 0.0–0.1)
BASOS PCT: 1 %
Eosinophils Absolute: 0.2 10*3/uL (ref 0.0–0.7)
Eosinophils Relative: 1 %
HEMATOCRIT: 41.4 % (ref 39.0–52.0)
Hemoglobin: 13.1 g/dL (ref 13.0–17.0)
LYMPHS PCT: 17 %
Lymphs Abs: 2.4 10*3/uL (ref 0.7–4.0)
MCH: 29.2 pg (ref 26.0–34.0)
MCHC: 31.6 g/dL (ref 30.0–36.0)
MCV: 92.4 fL (ref 78.0–100.0)
MONO ABS: 0.6 10*3/uL (ref 0.1–1.0)
Monocytes Relative: 4 %
NEUTROS ABS: 11.1 10*3/uL — AB (ref 1.7–7.7)
NEUTROS PCT: 77 %
PLATELETS: 310 10*3/uL (ref 150–400)
RBC: 4.48 MIL/uL (ref 4.22–5.81)
RDW: 14.1 % (ref 11.5–15.5)
WBC: 14.3 10*3/uL — AB (ref 4.0–10.5)

## 2018-02-16 LAB — TROPONIN I

## 2018-02-16 LAB — CBG MONITORING, ED
GLUCOSE-CAPILLARY: 338 mg/dL — AB (ref 65–99)
Glucose-Capillary: >600 mg/dL (ref 65–99)
Glucose-Capillary: >600 mg/dL (ref 65–99)
Glucose-Capillary: 516 mg/dL (ref 65–99)

## 2018-02-16 LAB — RAPID URINE DRUG SCREEN, HOSP PERFORMED
Amphetamines: NOT DETECTED
BARBITURATES: NOT DETECTED
Benzodiazepines: NOT DETECTED
Cocaine: NOT DETECTED
Opiates: NOT DETECTED
Tetrahydrocannabinol: POSITIVE — AB

## 2018-02-16 LAB — BLOOD GAS, VENOUS
Acid-base deficit: 11.1 mmol/L — ABNORMAL HIGH (ref 0.0–2.0)
Bicarbonate: 15.1 mmol/L — ABNORMAL LOW (ref 20.0–28.0)
DRAWN BY: 397271
FIO2: 21
O2 Saturation: 90.5 %
PATIENT TEMPERATURE: 37
PH VEN: 7.188 — AB (ref 7.250–7.430)
pCO2, Ven: 42.6 mmHg — ABNORMAL LOW (ref 44.0–60.0)
pO2, Ven: 73.5 mmHg — ABNORMAL HIGH (ref 32.0–45.0)

## 2018-02-16 LAB — COMPREHENSIVE METABOLIC PANEL
ALBUMIN: 3.8 g/dL (ref 3.5–5.0)
ALK PHOS: 105 U/L (ref 38–126)
ALT: 19 U/L (ref 17–63)
AST: 21 U/L (ref 15–41)
Anion gap: 19 — ABNORMAL HIGH (ref 5–15)
BUN: 32 mg/dL — ABNORMAL HIGH (ref 6–20)
CALCIUM: 9.5 mg/dL (ref 8.9–10.3)
CO2: 16 mmol/L — ABNORMAL LOW (ref 22–32)
Chloride: 89 mmol/L — ABNORMAL LOW (ref 101–111)
Creatinine, Ser: 2.2 mg/dL — ABNORMAL HIGH (ref 0.61–1.24)
GFR, EST AFRICAN AMERICAN: 44 mL/min — AB (ref >60)
GFR, EST NON AFRICAN AMERICAN: 38 mL/min — AB (ref >60)
GLUCOSE: 840 mg/dL — AB (ref 65–99)
POTASSIUM: 5.5 mmol/L — AB (ref 3.5–5.1)
Sodium: 124 mmol/L — ABNORMAL LOW (ref 135–145)
Total Bilirubin: 2.2 mg/dL — ABNORMAL HIGH (ref 0.3–1.2)
Total Protein: 8.2 g/dL — ABNORMAL HIGH (ref 6.5–8.1)

## 2018-02-16 LAB — ETHANOL: Alcohol, Ethyl (B): <10 mg/dL (ref <10)

## 2018-02-16 LAB — PROTIME-INR
INR: 0.97
PROTHROMBIN TIME: 12.8 s (ref 11.4–15.2)

## 2018-02-16 MED ORDER — ACETAMINOPHEN 325 MG PO TABS: 650.0000 mg | ORAL_TABLET | ORAL | Status: DC | PRN

## 2018-02-16 MED ORDER — INSULIN REGULAR BOLUS VIA INFUSION
0.0000 [IU] | Freq: Three times a day (TID) | INTRAVENOUS | Status: DC
Start: 2018-02-17 — End: 2018-02-16
  Filled 2018-02-16: qty 10

## 2018-02-16 MED ORDER — INSULIN REGULAR HUMAN 100 UNIT/ML IJ SOLN
INTRAMUSCULAR | Status: DC
  Administered 2018-02-16: 4.6 [IU]/h via INTRAVENOUS
  Filled 2018-02-16: qty 1

## 2018-02-16 MED ORDER — ACETAMINOPHEN 160 MG/5ML PO SOLN: 650.0000 mg | ORAL | Status: DC | PRN

## 2018-02-16 MED ORDER — SODIUM CHLORIDE 0.9 % IV SOLN
INTRAVENOUS | Status: DC
  Administered 2018-02-16: 21:00:00 via INTRAVENOUS

## 2018-02-16 MED ORDER — ASPIRIN 300 MG RE SUPP: 300.0000 mg | Freq: Every day | RECTAL | Status: DC

## 2018-02-16 MED ORDER — SENNOSIDES-DOCUSATE SODIUM 8.6-50 MG PO TABS
1.0000 | ORAL_TABLET | Freq: Every evening | ORAL | Status: DC | PRN
  Filled 2018-02-16: qty 1

## 2018-02-16 MED ORDER — DEXTROSE-NACL 5-0.45 % IV SOLN: INTRAVENOUS | Status: DC

## 2018-02-16 MED ORDER — INSULIN ASPART 100 UNIT/ML IV SOLN
10.0000 [IU] | Freq: Once | INTRAVENOUS | Status: AC
  Administered 2018-02-16: 10 [IU] via INTRAVENOUS

## 2018-02-16 MED ORDER — SODIUM CHLORIDE 0.9 % IV BOLUS
1000.0000 mL | Freq: Once | INTRAVENOUS | Status: AC
  Administered 2018-02-16: 1000 mL via INTRAVENOUS

## 2018-02-16 MED ORDER — SODIUM CHLORIDE 0.9 % IV SOLN
INTRAVENOUS | Status: AC
  Administered 2018-02-16: 22:00:00 via INTRAVENOUS

## 2018-02-16 MED ORDER — ASPIRIN 325 MG PO TABS
325.0000 mg | ORAL_TABLET | Freq: Every day | ORAL | Status: DC
  Administered 2018-02-17 – 2018-02-20 (×4): 325 mg via ORAL
  Filled 2018-02-16 (×4): qty 1

## 2018-02-16 MED ORDER — SODIUM CHLORIDE 0.9 % IV SOLN
INTRAVENOUS | Status: DC
  Administered 2018-02-16: 5.4 [IU]/h via INTRAVENOUS
  Filled 2018-02-16 (×2): qty 1

## 2018-02-16 MED ORDER — STROKE: EARLY STAGES OF RECOVERY BOOK
Freq: Once | Status: AC
  Administered 2018-02-20: 1
  Filled 2018-02-16: qty 1

## 2018-02-16 MED ORDER — ACETAMINOPHEN 650 MG RE SUPP: 650.0000 mg | RECTAL | Status: DC | PRN

## 2018-02-16 MED ORDER — ENOXAPARIN SODIUM 40 MG/0.4ML ~~LOC~~ SOLN
40.0000 mg | SUBCUTANEOUS | Status: DC
  Administered 2018-02-16 – 2018-02-21 (×6): 40 mg via SUBCUTANEOUS
  Filled 2018-02-16 (×6): qty 0.4

## 2018-02-16 MED ORDER — DEXTROSE-NACL 5-0.45 % IV SOLN
INTRAVENOUS | Status: DC
  Administered 2018-02-17 – 2018-02-20 (×5): via INTRAVENOUS

## 2018-02-16 MED ORDER — SODIUM CHLORIDE 0.9 % IV SOLN
INTRAVENOUS | Status: DC
  Administered 2018-02-16 – 2018-02-22 (×7): via INTRAVENOUS

## 2018-02-16 MED ORDER — DEXTROSE 50 % IV SOLN: 25.0000 mL | INTRAVENOUS | Status: DC | PRN

## 2018-02-16 NOTE — ED Provider Notes (Signed)
[facility] EMERGENCY DEPARTMENT Provider Note   CSN: [id number] Arrival date & time: 02/16/18  1819     History   Chief Complaint Chief Complaint  Patient presents with  . Numbness  . Hyperglycemia    HPI Nathaniel [name] is a 32 y.o. male.  The history is provided by the patient.  He has history of hypertension, hyperlipidemia, diabetes with multiple episodes of ketoacidosis and comes in with right arm and leg numbness for the last 2 days.  He states he thought it was because his blood sugar was low, so he thought it would get better.  He he has also noted weakness of his right arm and leg.  He denies any headache.  Symptoms have been stable over time.  Nothing makes it better, nothing makes it worse.  He did develop nausea this afternoon, and vomited once.  He has not been checking his sugars at home.  Past Medical History:  Diagnosis Date  . Diabetes mellitus    lantus/novolog  . Hyperlipidemia   . Hypertension   . Marijuana abuse    occaisionally  . Noncompliance with medication regimen   . Tobacco abuse    5/day    Patient Active Problem List   Diagnosis Date Noted  . Acute renal failure (ARF) ([place]) 10/21/2016  . Diabetic ketoacidosis without coma associated with type 1 diabetes mellitus ([place])   . Hyperbilirubinemia   . Hyponatremia 04/15/2016  . DM (diabetes mellitus) type 1, uncontrolled, with ketoacidosis ([place]) 04/15/2016  . Hyperglycemia 04/15/2016  . AKI (acute kidney injury) ([place]) 01/04/2016  . Malignant hypertension 01/04/2016  . Noncompliance with medications 01/04/2016  . Intractable nausea and vomiting 04/01/2015  . Gastroparesis 04/01/2015  . Type 1 diabetes mellitus with complication ([place]) 25/63/8937  . Chronic hypertension   . Nausea with vomiting   . Abscess, gluteal, right 06/21/2014  . Nausea and vomiting 04/08/2013  . Hyperglycemia without ketosis 04/08/2013  . Essential hypertension, benign 04/08/2013  . Hypercalcemia 07/29/2011  .  Vomiting 07/28/2011  . Leukocytosis 07/28/2011  . Hypokalemia 07/28/2011  . DKA, type 1 ([place]) 07/27/2011  . Dehydration 07/27/2011  . Tobacco abuse 07/27/2011  . Marijuana abuse 07/27/2011    Past Surgical History:  Procedure Laterality Date  . EYE SURGERY          Home Medications    Prior to Admission medications   Medication Sig Start Date End Date Taking? Authorizing Provider  amLODipine (NORVASC) 5 MG tablet Take 1 tablet (5 mg total) by mouth daily. 04/17/16   [name], MD  LEVEMIR FLEXTOUCH 100 UNIT/ML Pen INJECT 30 UNITS SUBCUTNEOUSLY A.C. Bid , Breakfast and supper. Patient taking differently: Inject 30 Units into the skin 2 (two) times daily. INJECT [place]. Bid , Breakfast and supper. 04/17/16   [name], MD  lisinopril (PRINIVIL,ZESTRIL) 10 MG tablet Take 1 tablet (10 mg total) by mouth daily. 04/04/15   [name], Delfino Lovett, MD  naproxen (NAPROSYN) 500 MG tablet Take 500 mg by mouth 2 (two) times daily. 09/08/16   [provider]  pantoprazole (PROTONIX) 40 MG tablet Take 1 tablet (40 mg total) by mouth daily. 04/17/16   [name], MD  pravastatin (PRAVACHOL) 40 MG tablet Take 1 tablet (40 mg total) by mouth daily at 6 PM. 06/10/15   [name], MD    Family History Family History  Problem Relation Age of Onset  . Diabetes Father   . Kidney failure Father  last 4 mnths of life  . Hypertension Mother     Social History Social History   Tobacco Use  . Smoking status: Current Every Day Smoker    Packs/day: 0.50    Years: 8.00    Pack years: 4.00    Types: Cigarettes  . Smokeless tobacco: Never Used  Substance Use Topics  . Alcohol use: No  . Drug use: Yes    Types: Marijuana    Comment: last use yesterday     Allergies   Orange   Review of Systems Review of Systems  All other systems reviewed and are negative.    Physical Exam Updated Vital Signs BP 120/79 (BP Location: Left Arm)    Pulse (!) 121   Temp 98.4 F (36.9 C) (Oral)   Resp 18   Ht 6\' 1"  (1.854 m)   Wt 72.6 kg (160 lb)   SpO2 98%   BMI 21.11 kg/m   Physical Exam  Nursing note and vitals reviewed.  32 year old male, resting comfortably and in no acute distress. Vital signs are significant for tachycardia. Oxygen saturation is 98%, which is normal. Head is normocephalic and atraumatic. PERRLA, EOMI. Oropharynx is clear. Neck is nontender and supple without adenopathy or JVD. Back is nontender and there is no CVA tenderness. Lungs are clear without rales, wheezes, or rhonchi. Chest is nontender. Heart has regular rate and rhythm without murmur. Abdomen is soft, flat, nontender without masses or hepatosplenomegaly and peristalsis is normoactive. Extremities have no cyanosis or edema, full range of motion is present. Skin is warm and dry without rash. Neurologic: Awake, alert, oriented x3.  Speech is normal.  No facial droop seen.  Tongue protrudes in the midline.  Decreased pinprick sensation in the second and third divisions of 5th cranial nerve on the right.  Remainder of cranial nerves appear intact.  Right hemiparesis is noted with strength 4/5 in right arm and right leg.  There is marked right pronator drift.  Decreased sensation is noted throughout the right arm and right leg.  ED Treatments / Results  Labs (all labs ordered are listed, but only abnormal results are displayed) Labs Reviewed  URINALYSIS, ROUTINE W REFLEX MICROSCOPIC - Abnormal; Notable for the following components:      Result Value   Color, Urine COLORLESS (*)    Glucose, UA >=500 (*)    Hgb urine dipstick MODERATE (*)    Ketones, ur 20 (*)    Protein, ur 100 (*)    All other components within normal limits  COMPREHENSIVE METABOLIC PANEL - Abnormal; Notable for the following components:   Sodium 124 (*)    Potassium 5.5 (*)    Chloride 89 (*)    CO2 16 (*)    Glucose, Bld 840 (*)    BUN 32 (*)    Creatinine, Ser 2.20 (*)     Total Protein 8.2 (*)    Total Bilirubin 2.2 (*)    GFR calc non Af Amer 38 (*)    GFR calc Af Amer 44 (*)    Anion gap 19 (*)    All other components within normal limits  CBC WITH DIFFERENTIAL/PLATELET - Abnormal; Notable for the following components:   WBC 14.3 (*)    Neutro Abs 11.1 (*)    All other components within normal limits  RAPID URINE DRUG SCREEN, HOSP PERFORMED - Abnormal; Notable for the following components:   Tetrahydrocannabinol POSITIVE (*)    All other components within normal limits  BLOOD GAS, VENOUS - Abnormal; Notable for the following components:   pH, Ven 7.188 (*)    pCO2, Ven 42.6 (*)    pO2, Ven 73.5 (*)    Bicarbonate 15.1 (*)    Acid-base deficit 11.1 (*)    All other components within normal limits  CBG MONITORING, ED - Abnormal; Notable for the following components:   Glucose-Capillary >600 (*)    All other components within normal limits  CBG MONITORING, ED - Abnormal; Notable for the following components:   Glucose-Capillary >600 (*)    All other components within normal limits  ETHANOL  PROTIME-INR  APTT  TROPONIN I    EKG EKG Interpretation  Date/Time:  Thursday Feb 16 2018 19:38:12 EDT Ventricular Rate:  106 PR Interval:    QRS Duration: 88 QT Interval:  327 QTC Calculation: 435 R Axis:   63 Text Interpretation:  Sinus tachycardia Probable left atrial enlargement ST elev, probable normal early repol pattern Tall T waves anterior leads concerning for hyperkalemia When compared with ECG of 07/19/2016, T wave amplitude has increased in Anterior leads Confirmed by Delora Fuel ([id number]) on 02/16/2018 7:44:01 PM   Radiology Ct Head Wo Contrast  Result Date: 02/16/2018 CLINICAL DATA:  Progressively worsening right hand numbness since yesterday. History of diabetes. EXAM: CT HEAD WITHOUT CONTRAST TECHNIQUE: Contiguous axial images were obtained from the base of the skull through the vertex without intravenous contrast. COMPARISON:  MRI  of the head January 09, 2014 FINDINGS: BRAIN: No intraparenchymal hemorrhage, mass effect nor midline shift. The ventricles and sulci are normal. No acute large vascular territory infarcts. No abnormal extra-axial fluid collections. New patchy hypodensity LEFT posterior limb of the internal capsule. Basal cisterns are patent. VASCULAR: Mild calcific atherosclerosis carotid siphons. SKULL/SOFT TISSUES: No skull fracture. No significant soft tissue swelling. ORBITS/SINUSES: The included ocular globes and orbital contents are normal.LEFT middle ear and mastoid effusion. Status post bilateral ocular lens implants. OTHER: None. IMPRESSION: 1. New LEFT internal capsule white matter changes seen with chronic small vessel ischemic disease though, given RIGHT-sided numbness this may be acute. 2. LEFT middle ear and mastoid effusion. 3. Mild atherosclerosis. Electronically Signed   By: Elon Alas M.D.   On: 02/16/2018 19:57    Procedures Procedures  CRITICAL CARE Performed by: Delora Fuel Total critical care time: 85 minutes Critical care time was exclusive of separately billable procedures and treating other patients. Critical care was necessary to treat or prevent imminent or life-threatening deterioration. Critical care was time spent personally by me on the following activities: development of treatment plan with patient and/or surrogate as well as nursing, discussions with consultants, evaluation of patient's response to treatment, examination of patient, obtaining history from patient or surrogate, ordering and performing treatments and interventions, ordering and review of laboratory studies, ordering and review of radiographic studies, pulse oximetry and re-evaluation of patient's condition.  Medications Ordered in ED Medications  dextrose 5 %-0.45 % sodium chloride infusion (has no administration in time range)  insulin regular bolus via infusion 0-10 Units (has no administration in time range)    insulin regular (NOVOLIN R,HUMULIN R) 100 Units in sodium chloride 0.9 % 100 mL (1 Units/mL) infusion (5.4 Units/hr Intravenous New Bag/Given 02/16/18 2051)  dextrose 50 % solution 25 mL (has no administration in time range)  0.9 %  sodium chloride infusion ( Intravenous New Bag/Given 02/16/18 2044)  sodium chloride 0.9 % bolus 1,000 mL (0 mLs Intravenous Stopped 02/16/18 2039)  insulin aspart (novoLOG) injection 10 Units (  10 Units Intravenous Given 02/16/18 1935)     Initial Impression / Assessment and Plan / ED Course  I have reviewed the triage vital signs and the nursing notes.  Pertinent labs & imaging results that were available during my care of the patient were reviewed by me and considered in my medical decision making (see chart for details).  Right-sided numbness and weakness concerning for stroke.  Unfortunately, he is well outside the window for thrombolytic therapy and endovascular therapy, so code stroke is not activated.  Old records are reviewed, and he has numerous ED visits for ketoacidosis.  CBG is greater than 600.  He will be started on IV fluids and insulin.  Stroke work-up initiated with CT of head, and screening labs obtained.  Laboratory work-up is consistent with ketoacidosis.  Acute kidney injury is noted which hopefully will improve with hydration.  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  He is started on glucose stabilizer.  Case is discussed with Dr. Darrick Meigs of [facility], who agrees to admit the patient.  Final Clinical Impressions(s) / ED Diagnoses   Final diagnoses:  Diabetic ketoacidosis without coma associated with type 1 diabetes mellitus ([place])  Cerebrovascular accident (CVA), unspecified mechanism ([place])  Acute kidney injury (nontraumatic) [facility])    ED Discharge Orders    None       Delora Fuel, MD 08/67/61 2059

## 2018-02-16 NOTE — H&P (Signed)
TR[facility] H&P    Patient Demographics:    Nathaniel [name], is a 32 y.o. male  MRN: [id number]  DOB - [birth year]  Admit Date - 02/16/2018  Referring MD/NP/PA: Dr. Roxanne Mins  Outpatient Primary MD for the patient is [name], MD  Patient coming from: Home  Chief complaint-right-sided numbness   HPI:    Nathaniel [name]  is a 33 y.o. male, With history of hypertension, hyperlipidemia, diabetes mellitus came to hospital with complaints of right-sided numbness for past 2 days.  Patient says that he had some weakness of right arm and leg.  Denies any headache.  No facial droop.  No slurred speech. No previous history of stroke. CT scan of the head showed new left internal capsule white matter changes seen with chronic small vessel ischemic disease though given right-sided numbness this may be acute. Patient also found to be in DKA, blood glucose elevated to 840 with anion gap of 19 patient started on IV insulin DKA protocol.  He denies chest pain or shortness of breath. Denies nausea vomiting or diarrhea. Denies fever or chills. No dysuria. Denies previous history of stroke or seizures.    Review of systems:     All other systems reviewed and are negative.   With Past History of the following :    Past Medical History:  Diagnosis Date  . Diabetes mellitus    lantus/novolog  . Hyperlipidemia   . Hypertension   . Marijuana abuse    occaisionally  . Noncompliance with medication regimen   . Tobacco abuse    5/day      Past Surgical History:  Procedure Laterality Date  . EYE SURGERY        Social History:      Social History   Tobacco Use  . Smoking status: Current Every Day Smoker    Packs/day: 0.50    Years: 8.00    Pack years: 4.00    Types: Cigarettes  . Smokeless tobacco: Never Used  Substance Use Topics  . Alcohol use: No       Family History :     Family History    Problem Relation Age of Onset  . Diabetes Father   . Kidney failure Father        last 4 mnths of life  . Hypertension Mother       Home Medications:   Prior to Admission medications   Medication Sig Start Date End Date Taking? Authorizing Provider  amLODipine (NORVASC) 5 MG tablet Take 1 tablet (5 mg total) by mouth daily. 04/17/16  Yes [name], Richard, MD  LEVEMIR FLEXTOUCH 100 UNIT/ML Pen INJECT 30 UNITS SUBCUTNEOUSLY A.C. Bid , Breakfast and supper. Patient taking differently: Inject 30 Units into the skin 2 (two) times daily. INJECT [place]. Bid , Breakfast and supper. 04/17/16  Yes [name], Delfino Lovett, MD  lisinopril (PRINIVIL,ZESTRIL) 10 MG tablet Take 1 tablet (10 mg total) by mouth daily. 04/04/15  Yes [name], Richard, MD  naproxen (NAPROSYN) 500 MG tablet Take 500 mg by mouth 2 (two)  times daily. 09/08/16  Yes [provider]     Allergies:     Allergies  Allergen Reactions  . Orange     Increases blood sugar immediately      Physical Exam:   Vitals  Blood pressure 120/79, pulse (!) 121, temperature 98.4 F (36.9 C), temperature source Oral, resp. rate 18, height 6\' 1"  (1.854 m), weight 72.6 kg (160 lb), SpO2 98 %.  1.  General: Appears in no acute distress  2. Psychiatric:  Intact judgement and  insight, awake alert, oriented x 3.  3. Neurologic: Slight reduction in motor strength of right upper and lower extremity, reduced sensation in right upper and lower extremity, cranial nerve II through XII grossly intact  4. Eyes :  anicteric sclerae, moist conjunctivae with no lid lag. PERRLA.  5. ENMT:  Oropharynx clear with moist mucous membranes and good dentition  6. Neck:  supple, no cervical lymphadenopathy appriciated, No thyromegaly  7. Respiratory : Normal respiratory effort, good air movement bilaterally,clear to  auscultation bilaterally  8. Cardiovascular : RRR, no gallops, rubs or murmurs, no leg edema  9.  Gastrointestinal:  Positive bowel sounds, abdomen soft, non-tender to palpation,no hepatosplenomegaly, no rigidity or guarding       10. Skin:  No cyanosis, normal texture and turgor, no rash, lesions or ulcers  11.Musculoskeletal:  Good muscle tone,  joints appear normal , no effusions,  normal range of motion    Data Review:    CBC Recent Labs  Lab 02/16/18 1910  WBC 14.3*  HGB 13.1  HCT 41.4  PLT 310  MCV 92.4  MCH 29.2  MCHC 31.6  RDW 14.1  LYMPHSABS 2.4  MONOABS 0.6  EOSABS 0.2  BASOSABS 0.1   ------------------------------------------------------------------------------------------------------------------  Chemistries  Recent Labs  Lab 02/16/18 1910  NA 124*  K 5.5*  CL 89*  CO2 16*  GLUCOSE 840*  BUN 32*  CREATININE 2.20*  CALCIUM 9.5  AST 21  ALT 19  ALKPHOS 105  BILITOT 2.2*   ------------------------------------------------------------------------------------------------------------------  ------------------------------------------------------------------------------------------------------------------ GFR: Estimated Creatinine Clearance: 50 mL/min (A) (by C-G formula based on SCr of 2.2 mg/dL (H)). Liver Function Tests: Recent Labs  Lab 02/16/18 1910  AST 21  ALT 19  ALKPHOS 105  BILITOT 2.2*  PROT 8.2*  ALBUMIN 3.8   No results for input(s): LIPASE, AMYLASE in the last 168 hours. No results for input(s): AMMONIA in the last 168 hours. Coagulation Profile: Recent Labs  Lab 02/16/18 1909  INR 0.97   Cardiac Enzymes: Recent Labs  Lab 02/16/18 1909  TROPONINI <0.03   BNP (last 3 results) No results for input(s): PROBNP in the last 8760 hours. HbA1C: No results for input(s): HGBA1C in the last 72 hours. CBG: Recent Labs  Lab 02/16/18 1835 02/16/18 2029  GLUCAP >600* >600*   Lipid Profile: No results for input(s): CHOL, HDL, LDLCALC, TRIG, CHOLHDL, LDLDIRECT in the last 72 hours. Thyroid Function Tests: No results for  input(s): TSH, T4TOTAL, FREET4, T3FREE, THYROIDAB in the last 72 hours. Anemia Panel: No results for input(s): VITAMINB12, FOLATE, FERRITIN, TIBC, IRON, RETICCTPCT in the last 72 hours.  --------------------------------------------------------------------------------------------------------------- Urine analysis:    Component Value Date/Time   COLORURINE COLORLESS (A) 02/16/2018 1837   APPEARANCEUR CLEAR 02/16/2018 1837   LABSPEC 1.016 02/16/2018 1837   PHURINE 5.0 02/16/2018 1837   GLUCOSEU >=500 (A) 02/16/2018 1837   HGBUR MODERATE (A) 02/16/2018 [place] NEGATIVE 02/16/2018 1837   KETONESUR 20 (A) 02/16/2018 1837  PROTEINUR 100 (A) 02/16/2018 1837   UROBILINOGEN 0.2 06/07/2015 1815   NITRITE NEGATIVE 02/16/2018 1837   LEUKOCYTESUR NEGATIVE 02/16/2018 1837      Imaging Results:    Ct Head Wo Contrast  Result Date: 02/16/2018 CLINICAL DATA:  Progressively worsening right hand numbness since yesterday. History of diabetes. EXAM: CT HEAD WITHOUT CONTRAST TECHNIQUE: Contiguous axial images were obtained from the base of the skull through the vertex without intravenous contrast. COMPARISON:  MRI of the head January 09, 2014 FINDINGS: BRAIN: No intraparenchymal hemorrhage, mass effect nor midline shift. The ventricles and sulci are normal. No acute large vascular territory infarcts. No abnormal extra-axial fluid collections. New patchy hypodensity LEFT posterior limb of the internal capsule. Basal cisterns are patent. VASCULAR: Mild calcific atherosclerosis carotid siphons. SKULL/SOFT TISSUES: No skull fracture. No significant soft tissue swelling. ORBITS/SINUSES: The included ocular globes and orbital contents are normal.LEFT middle ear and mastoid effusion. Status post bilateral ocular lens implants. OTHER: None. IMPRESSION: 1. New LEFT internal capsule white matter changes seen with chronic small vessel ischemic disease though, given RIGHT-sided numbness this may be acute. 2. LEFT  middle ear and mastoid effusion. 3. Mild atherosclerosis. Electronically Signed   By: Elon Alas M.D.   On: 02/16/2018 19:57    My personal review of EKG: Rhythm NSR   Assessment & Plan:    Active Problems:   Essential hypertension, benign   Type 1 diabetes mellitus with complication (HCC)   DKA (diabetic ketoacidoses) (HCC)   CVA (cerebral vascular accident) ([place]) Acute kidney injury    1. Diabetic ketoacidosis-patient presented with diabetic ketoacidosis, will start DKA protocol, anion gap is 19.  Check BMP every 4 hours as per protocol. 2. Stroke-patient presenting with right-sided numbness, CT scan shows new left internal capsule white matter changes seen with chronic small vessel disease though could be acute.  Will initiate stroke protocol, patient is out of window for TPA as symptoms have been going on for past 2 days.  Check MRI/MRI brain, carotid Dopplers, echocardiogram.  Will consult neurology in a.m.  Start aspirin. 3. Acute kidney injury -patient's creatinine is 2.20, baseline creatinine 1.09, likely from poor p.o. intake.-Started on IV fluids as per DKA protocol.  Will follow BMP. 4. Pseudohyponatremia-sodium is 124, likely from pseudohyponatremia from elevated blood glucose.  Follow serum sodium 5. Mild hyperkalemia-potassium is 5.5, should resolve with IV fluids and IV insulin.  Will follow potassium levels. 6. Hypertension-we will hold amlodipine and lisinopril for permissive hypertension.    DVT Prophylaxis-   Lovenox   AM Labs Ordered, also please review Full Orders  Family Communication: Admission, patients condition and plan of care including tests being ordered have been discussed with the patient and his mother at bedside who indicate understanding and agree with the plan and Code Status.  Code Status:   Full code  Admission status: Inpatient  Time spent in minutes : 60 minutes   Oswald Hillock M.D on 02/16/2018 at 9:35 PM  Between 7am to 7pm - Pager -  [phone]. After 7pm go to [email or web address] - password [facility]  [facility] - Office  [phone]

## 2018-02-16 NOTE — ED Notes (Addendum)
Date and time results received: 02/16/18 1930 (use smartphrase ".now" to insert current time)  Test: pH Critical Value: 7.188  Name of Provider Notified: Roxanne Mins  Orders Received? Or Actions Taken?:

## 2018-02-16 NOTE — ED Triage Notes (Signed)
Having right sided numbness that started yesterday and has progressively became worse.  Numbness is in both my right arm and right leg.  I think my blood sugars are high.  I do not check my blood sugars often per pt.

## 2018-02-16 NOTE — ED Notes (Signed)
CRITICAL VALUE ALERT  Critical Value:  Glucose 840  Date & Time Notied:  02/16/2018 1959  Provider Notified: [email or web address]  Orders Received/Actions taken: see chart for new orders

## 2018-02-17 ENCOUNTER — Other Ambulatory Visit: Payer: Self-pay

## 2018-02-17 ENCOUNTER — Inpatient Hospital Stay (HOSPITAL_COMMUNITY): Payer: Medicare HMO

## 2018-02-17 ENCOUNTER — Encounter (HOSPITAL_COMMUNITY): Payer: Self-pay

## 2018-02-17 DIAGNOSIS — I639 Cerebral infarction, unspecified: Secondary | ICD-10-CM

## 2018-02-17 LAB — BASIC METABOLIC PANEL
ANION GAP: 9 (ref 5–15)
ANION GAP: 6 (ref 5–15)
Anion gap: 9 (ref 5–15)
Anion gap: 7 (ref 5–15)
BUN: 31 mg/dL — ABNORMAL HIGH (ref 6–20)
BUN: 30 mg/dL — ABNORMAL HIGH (ref 6–20)
BUN: 28 mg/dL — ABNORMAL HIGH (ref 6–20)
BUN: 26 mg/dL — AB (ref 6–20)
CHLORIDE: 104 mmol/L (ref 101–111)
CHLORIDE: 104 mmol/L (ref 101–111)
CHLORIDE: 106 mmol/L (ref 101–111)
CO2: 23 mmol/L (ref 22–32)
CO2: 22 mmol/L (ref 22–32)
CO2: 24 mmol/L (ref 22–32)
CO2: 24 mmol/L (ref 22–32)
Calcium: 9 mg/dL (ref 8.9–10.3)
Calcium: 8.8 mg/dL — ABNORMAL LOW (ref 8.9–10.3)
Calcium: 8.9 mg/dL (ref 8.9–10.3)
Calcium: 8.9 mg/dL (ref 8.9–10.3)
Chloride: 104 mmol/L (ref 101–111)
Creatinine, Ser: 1.88 mg/dL — ABNORMAL HIGH (ref 0.61–1.24)
Creatinine, Ser: 1.88 mg/dL — ABNORMAL HIGH (ref 0.61–1.24)
Creatinine, Ser: 1.79 mg/dL — ABNORMAL HIGH (ref 0.61–1.24)
Creatinine, Ser: 1.7 mg/dL — ABNORMAL HIGH (ref 0.61–1.24)
GFR calc Af Amer: 53 mL/min — ABNORMAL LOW (ref >60)
GFR calc Af Amer: >60 mL/min (ref >60)
GFR calc non Af Amer: 46 mL/min — ABNORMAL LOW (ref >60)
GFR calc non Af Amer: 46 mL/min — ABNORMAL LOW (ref >60)
GFR calc non Af Amer: 49 mL/min — ABNORMAL LOW (ref >60)
GFR, EST AFRICAN AMERICAN: 53 mL/min — AB (ref >60)
GFR, EST AFRICAN AMERICAN: 57 mL/min — AB (ref >60)
GFR, EST NON AFRICAN AMERICAN: 52 mL/min — AB (ref >60)
GLUCOSE: 123 mg/dL — AB (ref 65–99)
Glucose, Bld: 222 mg/dL — ABNORMAL HIGH (ref 65–99)
Glucose, Bld: 250 mg/dL — ABNORMAL HIGH (ref 65–99)
Glucose, Bld: 121 mg/dL — ABNORMAL HIGH (ref 65–99)
POTASSIUM: 4 mmol/L (ref 3.5–5.1)
POTASSIUM: 3.7 mmol/L (ref 3.5–5.1)
POTASSIUM: 3.7 mmol/L (ref 3.5–5.1)
Potassium: 4.4 mmol/L (ref 3.5–5.1)
Sodium: 136 mmol/L (ref 135–145)
Sodium: 135 mmol/L (ref 135–145)
Sodium: 136 mmol/L (ref 135–145)
Sodium: 135 mmol/L (ref 135–145)

## 2018-02-17 LAB — ECHOCARDIOGRAM COMPLETE
HEIGHTINCHES: 74 in
WEIGHTICAEL: 2560 [oz_av]

## 2018-02-17 LAB — LIPID PANEL
Cholesterol: 246 mg/dL — ABNORMAL HIGH (ref 0–200)
HDL: 36 mg/dL — ABNORMAL LOW (ref >40)
LDL Cholesterol: 186 mg/dL — ABNORMAL HIGH (ref 0–99)
Total CHOL/HDL Ratio: 6.8 RATIO
Triglycerides: 119 mg/dL (ref <150)
VLDL: 24 mg/dL (ref 0–40)

## 2018-02-17 LAB — GLUCOSE, CAPILLARY
GLUCOSE-CAPILLARY: 256 mg/dL — AB (ref 65–99)
GLUCOSE-CAPILLARY: 134 mg/dL — AB (ref 65–99)
GLUCOSE-CAPILLARY: 142 mg/dL — AB (ref 65–99)
Glucose-Capillary: 113 mg/dL — ABNORMAL HIGH (ref 65–99)

## 2018-02-17 LAB — CBG MONITORING, ED
Glucose-Capillary: 114 mg/dL — ABNORMAL HIGH (ref 65–99)
Glucose-Capillary: 117 mg/dL — ABNORMAL HIGH (ref 65–99)
Glucose-Capillary: 163 mg/dL — ABNORMAL HIGH (ref 65–99)
Glucose-Capillary: 201 mg/dL — ABNORMAL HIGH (ref 65–99)
Glucose-Capillary: 226 mg/dL — ABNORMAL HIGH (ref 65–99)
Glucose-Capillary: 234 mg/dL — ABNORMAL HIGH (ref 65–99)
Glucose-Capillary: 272 mg/dL — ABNORMAL HIGH (ref 65–99)

## 2018-02-17 LAB — HEMOGLOBIN A1C
Hgb A1c MFr Bld: 10.1 % — ABNORMAL HIGH (ref 4.8–5.6)
MEAN PLASMA GLUCOSE: 243.17 mg/dL

## 2018-02-17 MED ORDER — INSULIN GLARGINE 100 UNIT/ML ~~LOC~~ SOLN
10.0000 [IU] | Freq: Once | SUBCUTANEOUS | Status: AC
Start: 2018-02-17 — End: 2018-02-17
  Administered 2018-02-17: 10 [IU] via SUBCUTANEOUS
  Filled 2018-02-17: qty 0.1

## 2018-02-17 MED ORDER — INSULIN ASPART 100 UNIT/ML ~~LOC~~ SOLN
0.0000 [IU] | Freq: Three times a day (TID) | SUBCUTANEOUS | Status: DC
  Administered 2018-02-17 (×2): 3 [IU] via SUBCUTANEOUS
  Administered 2018-02-17 – 2018-02-18 (×2): 11 [IU] via SUBCUTANEOUS
  Administered 2018-02-19: 7 [IU] via SUBCUTANEOUS
  Administered 2018-02-19: 4 [IU] via SUBCUTANEOUS
  Administered 2018-02-19: 20 [IU] via SUBCUTANEOUS
  Administered 2018-02-20: 7 [IU] via SUBCUTANEOUS
  Filled 2018-02-17: qty 1

## 2018-02-17 MED ORDER — PNEUMOCOCCAL VAC POLYVALENT 25 MCG/0.5ML IJ INJ
0.5000 mL | INJECTION | INTRAMUSCULAR | Status: AC
  Administered 2018-02-18: 0.5 mL via INTRAMUSCULAR
  Filled 2018-02-17: qty 0.5

## 2018-02-17 MED ORDER — INSULIN DETEMIR 100 UNIT/ML ~~LOC~~ SOLN
30.0000 [IU] | Freq: Two times a day (BID) | SUBCUTANEOUS | Status: DC
  Filled 2018-02-17 (×6): qty 0.3

## 2018-02-17 MED ORDER — INSULIN ASPART 100 UNIT/ML ~~LOC~~ SOLN: 0.0000 [IU] | Freq: Every day | SUBCUTANEOUS | Status: DC

## 2018-02-17 MED ORDER — LISINOPRIL 10 MG PO TABS
10.0000 mg | ORAL_TABLET | Freq: Every day | ORAL | Status: DC
  Administered 2018-02-17 – 2018-02-19 (×3): 10 mg via ORAL
  Filled 2018-02-17 (×3): qty 1

## 2018-02-17 MED ORDER — INSULIN DETEMIR 100 UNIT/ML ~~LOC~~ SOLN
40.0000 [IU] | Freq: Two times a day (BID) | SUBCUTANEOUS | Status: DC
  Administered 2018-02-17: 40 [IU] via SUBCUTANEOUS
  Filled 2018-02-17 (×6): qty 0.4

## 2018-02-17 NOTE — Plan of Care (Signed)
  Problem: Acute Rehab OT Goals (only OT should resolve) Goal: Pt. Will Perform Grooming Flowsheets (Taken 02/17/2018 1627) Pt Will Perform Grooming: with min guard assist;sitting;standing Goal: Pt. Will Perform Upper Body Dressing Flowsheets (Taken 02/17/2018 1627) Pt Will Perform Upper Body Dressing: with supervision;sitting Goal: Pt. Will Perform Lower Body Dressing Flowsheets (Taken 02/17/2018 1627) Pt Will Perform Lower Body Dressing: with min guard assist;sitting/lateral leans;sit to/from stand Goal: Pt. Will Transfer To Toilet Flowsheets (Taken 02/17/2018 1627) Pt Will Transfer to Toilet: with min assist;regular height toilet;bedside commode;ambulating;stand pivot transfer Goal: Pt. Will Perform Toileting-Clothing Manipulation Flowsheets (Taken 02/17/2018 1627) Pt Will Perform Toileting - Clothing Manipulation and hygiene: with supervision;with min guard assist;sitting/lateral leans;sit to/from stand Goal: Pt/Caregiver Will Perform Home Exercise Program Flowsheets (Taken 02/17/2018 1627) Pt/caregiver will Perform Home Exercise Program: Increased strength;Left upper extremity;Independently;With written HEP provided Note:  Increased coordination

## 2018-02-17 NOTE — Progress Notes (Signed)
Rehab Admissions Coordinator Note:  Patient was screened by Retta Diones for appropriateness for an Inpatient Acute Rehab Consult. Noted PT recommending inpatient rehab consult.  OT evaluation is pending.  I will follow progress over weekend and check back on Monday.  Patient has [facility] and we would need authorization if inpatient rehab is desired.  Retta Diones 02/17/2018, 3:43 PM  I can be reached at [phone].

## 2018-02-17 NOTE — Care Management Important Message (Signed)
Important Message  Patient Details  Name: Nathaniel [name] MRN: [id number] Date of Birth: [birth year]   Medicare Important Message Given:  Yes    Shelda Altes 02/17/2018, 12:31 PM

## 2018-02-17 NOTE — Progress Notes (Signed)
Inpatient Diabetes Program Recommendations  AACE/ADA: New Consensus Statement on Inpatient Glycemic Control (2015)  Target Ranges:  Prepandial:   less than 140 mg/dL      Peak postprandial:   less than 180 mg/dL (1-2 hours)      Critically ill patients:  140 - 180 mg/dL   Lab Results  Component Value Date   GLUCAP 256 (H) 02/17/2018   HGBA1C 10.3 (H) 10/21/2016    Tried to call patient over the phone and discuss DM management. Patient has been seen and reviewed by our team a few times in the past. Patient has a history of not checking his glucose and not taking insulin as prescribed. He has refused to see Endocrinologists in the past, now is coming in with stroke. Will try again later.  Thanks, Tama Headings RN, MSN, BC-ADM, [facility] Inpatient Diabetes Coordinator Team Pager [phone] (8a-5p)

## 2018-02-17 NOTE — Progress Notes (Signed)
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 daily Nathaniel [name] UJW:[id number] DOB: [birth year] DOA: 02/16/2018 PCP: [name], MD   Physical Exam: Blood pressure 136/87, pulse 95, temperature 98.4 F (36.9 C), temperature source Oral, resp. rate 16, height _0  (1.88 m), weight 72.6 kg (160 lb), SpO2 100 %.  Lungs clear to A&P no rales wheeze or rhonchi heart regular rhythm no S3-S4 no heaves thrills or rubs   Investigations:  No results found for this or any previous visit (from the past 240 hour(s)).   Basic Metabolic Panel: Recent Labs    02/17/18 0607 02/17/18 1034  NA 135 136  K 4.0 3.7  CL 104 106  CO2 22 24  GLUCOSE 250* 121*  BUN 30* 28*  CREATININE 1.88* 1.79*  CALCIUM 8.8* 8.9   Liver Function Tests: Recent Labs    02/16/18 1910  AST 21  ALT 19  ALKPHOS 105  BILITOT 2.2*  PROT 8.2*  ALBUMIN 3.8     CBC: Recent Labs    02/16/18 1910  WBC 14.3*  NEUTROABS 11.1*  HGB 13.1  HCT 41.4  MCV 92.4  PLT 310    Dg Chest 2 View  Result Date: 02/16/2018 CLINICAL DATA:  Stroke.  No chest pain. EXAM: CHEST - 2 VIEW COMPARISON:  July 19, 2016 FINDINGS: The heart size and mediastinal contours are within normal limits. Both lungs are clear. The visualized skeletal structures are unremarkable. IMPRESSION: No active cardiopulmonary disease. Electronically Signed   By: Dorise Bullion III M.D   On: 02/16/2018 22:48   Ct Head Wo Contrast  Result Date: 02/16/2018 CLINICAL DATA:  Progressively worsening right hand numbness since yesterday. History of diabetes. EXAM: CT HEAD  WITHOUT CONTRAST TECHNIQUE: Contiguous axial images were obtained from the base of the skull through the vertex without intravenous contrast. COMPARISON:  MRI of the head January 09, 2014 FINDINGS: BRAIN: No intraparenchymal hemorrhage, mass effect nor midline shift. The ventricles and sulci are normal. No acute large vascular territory infarcts. No abnormal extra-axial fluid collections. New patchy hypodensity LEFT posterior limb of the internal capsule. Basal cisterns are patent. VASCULAR: Mild calcific atherosclerosis carotid siphons. SKULL/SOFT TISSUES: No skull fracture. No significant soft tissue swelling. ORBITS/SINUSES: The included ocular globes and orbital contents are normal.LEFT middle ear and mastoid effusion. Status post bilateral ocular lens implants. OTHER: None. IMPRESSION: 1. New LEFT internal capsule white matter changes seen with chronic small vessel ischemic disease though, given RIGHT-sided numbness this may be acute. 2. LEFT middle ear and mastoid effusion. 3. Mild atherosclerosis. Electronically Signed   By: Elon Alas M.D.   On: 02/16/2018 19:57   Mr Brain Wo Contrast  Result Date: 02/17/2018 CLINICAL DATA:  32 year old male with progressive right side numbness in the arm and leg since yesterday. Diabetes. EXAM: MRI HEAD WITHOUT CONTRAST MRA HEAD WITHOUT CONTRAST TECHNIQUE: Multiplanar, multiecho pulse sequences of the brain and surrounding structures were obtained without intravenous contrast. Angiographic images of the head were obtained using MRA technique without contrast. COMPARISON:  Head CT without contrast 02/16/2018. Cervical spine MRI 11/19/2015. Brain MRI 01/09/2014. FINDINGS: MRI HEAD FINDINGS Brain: Oval 54  millimeter focus of restricted diffusion in the lateral left thalamus and posterior limb left internal capsule as seen on series 5, image 49. Associated T2 and FLAIR hyperintensity. No hemorrhage or mass effect. There are also 2 smaller, up to 10 millimeters,  subcortical white matter foci of restricted diffusion in the anterior left frontal lobe (series 3, image 82). Similar T2 and FLAIR hyperintensity with no hemorrhage or mass effect. Furthermore, there is a 12 millimeter area of abnormal trace diffusion in the right superior cerebellum (series 3, image 73) which appears T2 and FLAIR hyperintense but isointense on ADC compatible with a small subacute infarct. No hemorrhage or mass effect. Multiple chronic small lacunar infarcts bilateral cerebellum and dorsal right pons are new since 2013. Outside of the acute findings the deep gray matter nuclei remain normal. Periventricular white matter T2 and FLAIR hyperintensity has progressed since 2013 outside of the acute areas, including the right periatrial white matter on series 13, image 25. No cerebral cortical encephalomalacia identified. No chronic cerebral blood products identified. No midline shift, mass effect, evidence of mass lesion, ventriculomegaly, extra-axial collection or acute intracranial hemorrhage. Cervicomedullary junction and pituitary are within normal limits. Vascular: Major intracranial vascular flow voids are stable since 2015. Skull and upper cervical spine: Negative visible cervical spine. Visualized bone marrow signal is within normal limits. Sinuses/Orbits: Stable orbits soft tissues with postoperative changes to both globes. Mild paranasal sinus mucosal thickening has regressed. Other: Chronic left mastoid effusion is stable to mildly improved. Trace right mastoid fluid is stable. Grossly normal visible internal auditory structures. Grossly negative scalp and face soft tissues. MRA HEAD FINDINGS Antegrade flow in the posterior circulation with codominant distal vertebral arteries. No stenosis. Normal left PICA origin. The right AICA may be dominant. Patent vertebrobasilar junction and basilar artery without stenosis. Patent SCA and PCA origins. Both posterior communicating arteries are present.  Bilateral PCA branches are within normal limits. Antegrade flow in both ICA siphons with atherosclerotic versus artifactual bilateral cavernous and proximal supraclinoid siphon irregularity and narrowing (series 103, image 7). Normal ophthalmic and posterior communicating artery origins. Normal appearance of the supraclinoid ICAs. Patent carotid termini. Normal MCA and ACA origins. Anterior communicating artery and visible ACA branches are normal. The left MCA bifurcates early. The left M1, bifurcation, and visible left MCA branches are within normal limits. The right MCA M1, bifurcation, and visible right MCA branches are within normal limits. IMPRESSION: 1. Acute lacunar infarct of the Left lateral thalamus/internal capsule corresponding to the CT finding yesterday. No associated hemorrhage or mass effect. 2. Additional small acute lacunar infarcts in the anterior left frontal lobe subcortical white matter (Left MCA territory). Subacute infarct of the Right superior cerebellum (Right SCA territory). No hemorrhage or mass effect. 3. Multiple underlying chronic lacunar infarcts in the cerebellum and the right pons are new since 2015. 4. Intracranial MRA remarkable for moderate irregularity and stenosis of the bilateral cavernous ICAs. This is suspicious for advanced ICA atherosclerosis in this clinical setting, but might be exacerbated by artifact from hyperplastic sphenoid paranasal sinuses. Electronically Signed   By: Genevie Ann M.D.   On: 02/17/2018 07:30   US Carotid Bilateral (at [facility] Only)  Result Date: 02/17/2018 CLINICAL DATA:  Acute cerebral infarction. EXAM: BILATERAL CAROTID DUPLEX ULTRASOUND TECHNIQUE: Pearline Cables scale imaging, color Doppler and duplex ultrasound were performed of bilateral carotid and vertebral arteries in the neck. COMPARISON:  None. FINDINGS: Criteria: Quantification of carotid stenosis is based on velocity parameters that correlate the residual internal  carotid diameter with  NASCET-based stenosis levels, using the diameter of the distal internal carotid lumen as the denominator for stenosis measurement. The following velocity measurements were obtained: RIGHT ICA:  77/23 cm/sec CCA:  403/47 cm/sec SYSTOLIC ICA/CCA RATIO:  0.6 ECA:  217 cm/sec LEFT ICA:  80/23 cm/sec CCA:  425/95 cm/sec SYSTOLIC ICA/CCA RATIO:  0.7 ECA:  114 cm/sec RIGHT CAROTID ARTERY: No focal plaque identified. Normal velocities and waveforms. No evidence of right carotid stenosis. RIGHT VERTEBRAL ARTERY: Antegrade flow with normal waveform and velocity. LEFT CAROTID ARTERY: Trace plaque at the level of the left carotid bulb. No evidence of ICA plaque or stenosis. LEFT VERTEBRAL ARTERY: Antegrade flow with normal waveform and velocity. IMPRESSION: Minimal plaque at the level of the left carotid bulb. No evidence of bilateral ICA plaque or stenosis. Electronically Signed   By: Aletta Edouard M.D.   On: 02/17/2018 08:22   Mr Jodene Nam Head/brain GL Cm  Result Date: 02/17/2018 CLINICAL DATA:  32 year old male with progressive right side numbness in the arm and leg since yesterday. Diabetes. EXAM: MRI HEAD WITHOUT CONTRAST MRA HEAD WITHOUT CONTRAST TECHNIQUE: Multiplanar, multiecho pulse sequences of the brain and surrounding structures were obtained without intravenous contrast. Angiographic images of the head were obtained using MRA technique without contrast. COMPARISON:  Head CT without contrast 02/16/2018. Cervical spine MRI 11/19/2015. Brain MRI 01/09/2014. FINDINGS: MRI HEAD FINDINGS Brain: Oval 17 millimeter focus of restricted diffusion in the lateral left thalamus and posterior limb left internal capsule as seen on series 5, image 49. Associated T2 and FLAIR hyperintensity. No hemorrhage or mass effect. There are also 2 smaller, up to 10 millimeters, subcortical white matter foci of restricted diffusion in the anterior left frontal lobe (series 3, image 82). Similar T2 and FLAIR hyperintensity with no hemorrhage  or mass effect. Furthermore, there is a 12 millimeter area of abnormal trace diffusion in the right superior cerebellum (series 3, image 73) which appears T2 and FLAIR hyperintense but isointense on ADC compatible with a small subacute infarct. No hemorrhage or mass effect. Multiple chronic small lacunar infarcts bilateral cerebellum and dorsal right pons are new since 2013. Outside of the acute findings the deep gray matter nuclei remain normal. Periventricular white matter T2 and FLAIR hyperintensity has progressed since 2013 outside of the acute areas, including the right periatrial white matter on series 13, image 25. No cerebral cortical encephalomalacia identified. No chronic cerebral blood products identified. No midline shift, mass effect, evidence of mass lesion, ventriculomegaly, extra-axial collection or acute intracranial hemorrhage. Cervicomedullary junction and pituitary are within normal limits. Vascular: Major intracranial vascular flow voids are stable since 2015. Skull and upper cervical spine: Negative visible cervical spine. Visualized bone marrow signal is within normal limits. Sinuses/Orbits: Stable orbits soft tissues with postoperative changes to both globes. Mild paranasal sinus mucosal thickening has regressed. Other: Chronic left mastoid effusion is stable to mildly improved. Trace right mastoid fluid is stable. Grossly normal visible internal auditory structures. Grossly negative scalp and face soft tissues. MRA HEAD FINDINGS Antegrade flow in the posterior circulation with codominant distal vertebral arteries. No stenosis. Normal left PICA origin. The right AICA may be dominant. Patent vertebrobasilar junction and basilar artery without stenosis. Patent SCA and PCA origins. Both posterior communicating arteries are present. Bilateral PCA branches are within normal limits. Antegrade flow in both ICA siphons with atherosclerotic versus artifactual bilateral cavernous and proximal  supraclinoid siphon irregularity and narrowing (series 103, image 7). Normal ophthalmic and posterior communicating artery origins. Normal  appearance of the supraclinoid ICAs. Patent carotid termini. Normal MCA and ACA origins. Anterior communicating artery and visible ACA branches are normal. The left MCA bifurcates early. The left M1, bifurcation, and visible left MCA branches are within normal limits. The right MCA M1, bifurcation, and visible right MCA branches are within normal limits. IMPRESSION: 1. Acute lacunar infarct of the Left lateral thalamus/internal capsule corresponding to the CT finding yesterday. No associated hemorrhage or mass effect. 2. Additional small acute lacunar infarcts in the anterior left frontal lobe subcortical white matter (Left MCA territory). Subacute infarct of the Right superior cerebellum (Right SCA territory). No hemorrhage or mass effect. 3. Multiple underlying chronic lacunar infarcts in the cerebellum and the right pons are new since 2015. 4. Intracranial MRA remarkable for moderate irregularity and stenosis of the bilateral cavernous ICAs. This is suspicious for advanced ICA atherosclerosis in this clinical setting, but might be exacerbated by artifact from hyperplastic sphenoid paranasal sinuses. Electronically Signed   By: Genevie Ann M.D.   On: 02/17/2018 07:30      Medications:   Impression:  Active Problems:   Essential hypertension, benign   Type 1 diabetes mellitus with complication (HCC)   DKA (diabetic ketoacidoses) (HCC)   CVA (cerebral vascular accident) ([place])     Plan: Increase Levemir to 40 units before meals twice daily breakfast and supper continue sliding scale insulin resistant scale check be met daily x3 consider discharge in 24 to 48 hours if patient remains normoglycemic  Consultants:    Procedures   Antibiotics:      Time spent: 30 minutes   LOS: 1 day   [place]aic Syler M   02/17/2018, 2:43 PM

## 2018-02-17 NOTE — Plan of Care (Signed)
  Problem: Acute Rehab PT Goals(only PT should resolve) Goal: Pt Will Go Supine/Side To Sit Outcome: Progressing Flowsheets (Taken 02/17/2018 1503) Pt will go Supine/Side to Sit: with supervision Goal: Patient Will Transfer Sit To/From Stand Outcome: Progressing Flowsheets (Taken 02/17/2018 1503) Patient will transfer sit to/from stand: with min guard assist Goal: Pt Will Transfer Bed To Chair/Chair To Bed Outcome: Progressing Flowsheets (Taken 02/17/2018 1503) Pt will Transfer Bed to Chair/Chair to Bed: min guard assist Goal: Pt Will Ambulate Outcome: Progressing Flowsheets (Taken 02/17/2018 1503) Pt will Ambulate: 25 feet;with minimal assist;with rolling walker  3:04 PM, 02/17/18 Lonell Grandchild, MPT Physical Therapist with [facility] [phone] office [phone] mobile phone

## 2018-02-17 NOTE — Progress Notes (Signed)
SLP Cancellation Note  Patient Details Name: Nathaniel [name] MRN: [id number] DOB: 16-Mar-1986   Cancelled treatment:   SLP attempted SLE this day; however, Pt sleeping upon arrived and refused SLE; preferred to wait until a later time when family was present. Pt was oriented x2.  SLP wrote date on white board to assist in orientation to date/time.  Will follow up at a later time.  Joneen Boers  M.A., CCC-SLP Payten [email or web address] Ogren Joslynne Klatt 02/17/2018, 2:34 PM

## 2018-02-17 NOTE — Progress Notes (Signed)
*  PRELIMINARY RESULTS* Echocardiogram 2D Echocardiogram has been performed.  Nathaniel [name] 02/17/2018, 11:02 AM

## 2018-02-17 NOTE — Evaluation (Signed)
Physical Therapy Evaluation Patient Details Name: Nathaniel [name] MRN: [id number] DOB: July 31, 1986 Today's Date: 02/17/2018   History of Present Illness  Nathaniel [name]  is a 32 y.o. male, With history of hypertension, hyperlipidemia, diabetes mellitus came to hospital with complaints of right-sided numbness for past 2 days.  Patient says that he had some weakness of right arm and leg.  Denies any headache.  No facial droop.  No slurred speech. 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     Clinical Impression  Patient has difficulty maintaining sitting balance with frequent leaning to the right, has to hold onto bed rail with left hand to maintain sitting balance, unable to stand without assistive device due to buckling of RLE, can stand and take a few steps with RW, but limited secondary to c/o fatigue and right sided weakness.  Patient tolerated sitting up in chair after therapy.  Patient will benefit from continued physical therapy in hospital and recommended venue below to increase strength, balance, endurance for safe ADLs and gait.    Follow Up Recommendations CIR;Supervision/Assistance - 24 hour;Supervision for mobility/OOB    Equipment Recommendations       Recommendations for Other Services       Precautions / Restrictions Precautions Precautions: Fall Precaution Comments: Pt fell out of bed in hospital room. Multiple falls past 2 days Restrictions Weight Bearing Restrictions: No      Mobility  Bed Mobility Overal bed mobility: Needs Assistance Bed Mobility: Supine to Sit;Sit to Supine     Supine to sit: Min guard Sit to supine: Min guard   General bed mobility comments: difficulty moving RLE  Transfers Overall transfer level: Needs assistance Equipment used: Rolling walker (2 wheeled);None Transfers: Sit to/from [employer] to Stand: Min assist Stand pivot transfers: Min assist;Mod assist       General  transfer comment: very unsteady on right side with poor cooridination of RLE  Ambulation/Gait Ambulation/Gait assistance: Mod assist Ambulation Distance (Feet): 12 Feet Assistive device: Rolling walker (2 wheeled) Gait Pattern/deviations: Decreased step length - right;Decreased stance time - right;Decreased dorsiflexion - right;Decreased stride length;Scissoring Gait velocity: slow   General Gait Details: very unstead on feet with frequent buckling of right knee, scissoring of legs when making turns, diffiuclty advancing RLE due to weakness and decreased proprioception  Stairs            Wheelchair Mobility    Modified Rankin (Stroke Patients Only)       Balance Overall balance assessment: Needs assistance Sitting-balance support: Feet supported;No upper extremity supported Sitting balance-Leahy Scale: Poor Sitting balance - Comments: fair/poor when not supporting with BUE, tends to lean to the right, has to hang onto siderail with LUE to maintain sitting balance Postural control: Right lateral lean Standing balance support: No upper extremity supported;During functional activity Standing balance-Leahy Scale: Poor Standing balance comment: fair/poor when using RW                             Pertinent Vitals/Pain Pain Assessment: No/denies pain    Home Living Family/patient expects to be discharged to:: Private residence Living Arrangements: Parent;Other relatives Available Help at Discharge: Family;Available 24 hours/day Type of Home: Mobile home(double wide trailer) Home Access: Stairs to enter Entrance Stairs-Rails: Right Entrance Stairs-Number of Steps: 2 Home Layout: One level Home Equipment: None      Prior Function Level of Independence: Independent   Gait / Transfers Assistance Needed:  was independent until unable to walk for past 2 days           Hand Dominance   Dominant Hand: Right    Extremity/Trunk Assessment   Upper Extremity  Assessment Upper Extremity Assessment: Defer to OT evaluation    Lower Extremity Assessment Lower Extremity Assessment: Generalized weakness;RLE deficits/detail RLE Deficits / Details: grossly -3/5  RLE Sensation: decreased proprioception RLE Coordination: decreased gross motor    Cervical / Trunk Assessment Cervical / Trunk Assessment: Normal  Communication   Communication: No difficulties  Cognition Arousal/Alertness: Awake/alert Behavior During Therapy: WFL for tasks assessed/performed Overall Cognitive Status: Within Functional Limits for tasks assessed                                        General Comments      Exercises     Assessment/Plan    PT Assessment Patient needs continued PT services  PT Problem List Decreased strength;Decreased activity tolerance;Decreased balance;Decreased mobility       PT Treatment Interventions Gait training;Stair training;Functional mobility training;Therapeutic activities;Therapeutic exercise;Patient/family education;Neuromuscular re-education    PT Goals (Current goals can be found in the Care Plan section)  Acute Rehab PT Goals Patient Stated Goal: return to PLOF PT Goal Formulation: With patient Time For Goal Achievement: 03/03/18 Potential to Achieve Goals: Good    Frequency 7X/week   Barriers to discharge        Co-evaluation               AM-PAC PT "6 Clicks" Daily Activity  Outcome Measure Difficulty turning over in bed (including adjusting bedclothes, sheets and blankets)?: A Little Difficulty moving from lying on back to sitting on the side of the bed? : A Little Difficulty sitting down on and standing up from a chair with arms (e.g., wheelchair, bedside commode, etc,.)?: A Lot Help needed moving to and from a bed to chair (including a wheelchair)?: A Lot Help needed walking in hospital room?: A Lot Help needed climbing 3-5 steps with a railing? : A Lot 6 Click Score: 14    End of  Session Equipment Utilized During Treatment: Gait belt Activity Tolerance: Patient tolerated treatment well;Patient limited by fatigue Patient left: in chair;with call bell/phone within reach;with chair alarm set Nurse Communication: Mobility status PT Visit Diagnosis: Unsteadiness on feet (R26.81);Other abnormalities of gait and mobility (R26.89);Muscle weakness (generalized) (M62.81)    Time: 8675-4492 PT Time Calculation (min) (ACUTE ONLY): 26 min   Charges:   PT Evaluation $PT Eval Moderate Complexity: 1 Mod PT Treatments $Therapeutic Activity: 23-37 mins   PT G Codes:        3:02 PM, February 21, 2018 Lonell Grandchild, MPT Physical Therapist with [facility] [phone] office [phone] mobile phone

## 2018-02-17 NOTE — Evaluation (Signed)
Occupational Therapy Evaluation Patient Details Name: Nathaniel [name] MRN: [id number] DOB: [birth year] Today's Date: 02/17/2018    History of Present Illness Nathaniel [name]  is a 32 y.o. male, With history of hypertension, hyperlipidemia, diabetes mellitus came to hospital with complaints of right-sided numbness for past 2 days.  Patient says that he had some weakness of right arm and leg.  Denies any headache.  No facial droop.  No slurred speech. 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    Clinical Impression   Pt received supine in bed, agreeable to OT evaluation.  Pt reporting difficulty using RUE, subjective sensation appears dulled with exception of random areas on dorsal hand. Pt strength is WFL at 4+/5, however pt demonstrating gross and fine motor coordination deficits limiting use of RUE as dominant. Grip and fine motor strength are good-pt able to open package of socks with minimal difficulty. Pt is unable to complete ADLs in standing due to RLE weakness/incoordination and balance deficits. Min guard to moderate assist in sitting due to intermittent sitting balance deficits. Recommend CIR on discharge to improve safety and independence during ADL and functional mobility completion as well as improve RUE coordination with functional tasks.   *Of note-pt did fall forward off of bed onto hands and knees during evaluation. Was able to come to tall kneeling, place hands on bed, turn and sit down without difficulty. Pt reporting no pain or injury, did not hit head. Fall witnessed by OT and RN.*    Follow Up Recommendations    CIR   Equipment Recommendations  None recommended by OT       Precautions / Restrictions Precautions Precautions: Fall Precaution Comments: Pt fell out of bed in hospital room. Multiple falls past 2 days Restrictions Weight Bearing Restrictions: No      Mobility Bed Mobility Overal bed mobility: Needs Assistance Bed Mobility:  Supine to Sit;Sit to Supine     Supine to sit: Min guard Sit to supine: Min guard   General bed mobility comments: Pt with difficulty removing covers and bringing right leg off bed. Increased time.   Transfers Overall transfer level: Needs assistance Equipment used: Standard walker Transfers: Sit to/from Stand Sit to Stand: Min assist         General transfer comment: Pt with slight difficulty grasping walker, anterior lean while standing. Stood for approximately 1 minute before needing to sit due to dizziness (room spinning). 2 trials completed        ADL either performed or assessed with clinical judgement   ADL Overall ADL's : Needs assistance/impaired     Grooming: Wash/dry hands;Set up;Sitting Grooming Details (indicate cue type and reason): Pt unable to stand for grooming tasks         Upper Body Dressing : Min guard;Sitting Upper Body Dressing Details (indicate cue type and reason): Increased time due to right coordination deficits Lower Body Dressing: Moderate assistance;Sitting/lateral leans Lower Body Dressing Details (indicate cue type and reason): Pt able to donn left sock using bilateral hands, right hand occasionally slipping off sock. Pt unable to donn right sock due to inability to bring right foot to left knee and unable to bend down due to balance deficits. OT assisted with right sock               General ADL Comments: Pt with slight right lean during seated tasks.      Vision Baseline Vision/History: Wears glasses Wears Glasses: Reading only Patient Visual Report: No change  from baseline Vision Assessment?: No apparent visual deficits            Pertinent Vitals/Pain Pain Assessment: No/denies pain     Hand Dominance Right   Extremity/Trunk Assessment Upper Extremity Assessment Upper Extremity Assessment: RUE deficits/detail RUE Deficits / Details: RUE strength is good during MMT at 4+/5. Pt appears to have gross and fine motor  coordination deficits RUE Sensation: decreased light touch RUE Coordination: decreased fine motor;decreased gross motor   Lower Extremity Assessment Lower Extremity Assessment: Defer to PT evaluation   Cervical / Trunk Assessment Cervical / Trunk Assessment: Normal   Communication Communication Communication: No difficulties   Cognition Arousal/Alertness: Lethargic Behavior During Therapy: WFL for tasks assessed/performed Overall Cognitive Status: Within Functional Limits for tasks assessed                                                Home Living Family/patient expects to be discharged to:: Private residence Living Arrangements: Parent(Mom) Available Help at Discharge: Family;Available 24 hours/day Type of Home: House                       Home Equipment: None          Prior Functioning/Environment Level of Independence: Needs assistance  Gait / Transfers Assistance Needed: Pt hasn't been able to walk for past 2 days, reports multiple falls trying to get to bathroom ADL's / Homemaking Assistance Needed: Pt reports independence prior to past 2 days. For past 2 days mother has been assisting with dressing and toileting, pt unable to get up to perform ADLs            OT Problem List: Decreased strength;Decreased activity tolerance;Impaired balance (sitting and/or standing);Decreased coordination;Decreased safety awareness;Decreased knowledge of use of DME or AE;Impaired UE functional use;Impaired sensation      OT Treatment/Interventions: Self-care/ADL training;Therapeutic exercise;Neuromuscular education;DME and/or AE instruction;Therapeutic activities;Patient/family education    OT Goals(Current goals can be found in the care plan section) Acute Rehab OT Goals Patient Stated Goal: To be able to use my right arm and leg OT Goal Formulation: With patient Time For Goal Achievement: 03/03/18 Potential to Achieve Goals: Good  OT Frequency:  Min 2X/week    End of Session Equipment Utilized During Treatment: Gait belt;Rolling walker Nurse Communication: Precautions  Activity Tolerance: Patient tolerated treatment well Patient left: in bed;with call bell/phone within reach;with chair alarm set  OT Visit Diagnosis: Unsteadiness on feet (R26.81);Muscle weakness (generalized) (M62.81);History of falling (Z91.81);Hemiplegia and hemiparesis Hemiplegia - Right/Left: Right Hemiplegia - dominant/non-dominant: Dominant Hemiplegia - caused by: Cerebral infarction                Time: 3329-5188 OT Time Calculation (min): 30 min Charges:  OT General Charges $OT Visit: 1 Visit OT Evaluation $OT Eval Moderate Complexity: [place], OTR/L  [phone] 02/17/2018, 9:37 AM

## 2018-02-18 LAB — GLUCOSE, CAPILLARY
GLUCOSE-CAPILLARY: 35 mg/dL — AB (ref 65–99)
GLUCOSE-CAPILLARY: 74 mg/dL (ref 65–99)
GLUCOSE-CAPILLARY: 136 mg/dL — AB (ref 65–99)
GLUCOSE-CAPILLARY: 255 mg/dL — AB (ref 65–99)
GLUCOSE-CAPILLARY: 100 mg/dL — AB (ref 65–99)
GLUCOSE-CAPILLARY: 48 mg/dL — AB (ref 65–99)
GLUCOSE-CAPILLARY: 226 mg/dL — AB (ref 65–99)
Glucose-Capillary: 30 mg/dL — CL (ref 65–99)
Glucose-Capillary: 22 mg/dL — CL (ref 65–99)

## 2018-02-18 LAB — BASIC METABOLIC PANEL
ANION GAP: 7 (ref 5–15)
BUN: 17 mg/dL (ref 6–20)
CALCIUM: 9.1 mg/dL (ref 8.9–10.3)
CHLORIDE: 109 mmol/L (ref 101–111)
CO2: 25 mmol/L (ref 22–32)
Creatinine, Ser: 1.58 mg/dL — ABNORMAL HIGH (ref 0.61–1.24)
GFR calc non Af Amer: 57 mL/min — ABNORMAL LOW (ref >60)
GLUCOSE: 30 mg/dL — AB (ref 65–99)
Potassium: 3.5 mmol/L (ref 3.5–5.1)
Sodium: 141 mmol/L (ref 135–145)

## 2018-02-18 LAB — HIV ANTIBODY (ROUTINE TESTING W REFLEX): HIV Screen 4th Generation wRfx: NONREACTIVE

## 2018-02-18 MED ORDER — HYDRALAZINE HCL 20 MG/ML IJ SOLN
25.0000 mg | Freq: Four times a day (QID) | INTRAMUSCULAR | Status: DC | PRN
  Administered 2018-02-18 – 2018-02-22 (×2): 25 mg via INTRAVENOUS
  Filled 2018-02-18 (×2): qty 2

## 2018-02-18 MED ORDER — GLUCOSE 40 % PO GEL
ORAL | Status: AC
  Administered 2018-02-18: 08:00:00
  Filled 2018-02-18: qty 1

## 2018-02-18 MED ORDER — INSULIN DETEMIR 100 UNIT/ML ~~LOC~~ SOLN
30.0000 [IU] | Freq: Two times a day (BID) | SUBCUTANEOUS | Status: DC
  Administered 2018-02-19: 30 [IU] via SUBCUTANEOUS
  Filled 2018-02-18 (×4): qty 0.3

## 2018-02-18 MED ORDER — GLUCOSE 40 % PO GEL
ORAL | Status: AC
  Administered 2018-02-18: 17:00:00
  Filled 2018-02-18: qty 1

## 2018-02-18 MED ORDER — STROKE: EARLY STAGES OF RECOVERY BOOK
Freq: Once | Status: AC
  Administered 2018-02-18: 13:00:00
  Filled 2018-02-18: qty 1

## 2018-02-18 MED ORDER — INSULIN DETEMIR 100 UNIT/ML ~~LOC~~ SOLN
30.0000 [IU] | Freq: Two times a day (BID) | SUBCUTANEOUS | Status: DC
  Administered 2018-02-18: 30 [IU] via SUBCUTANEOUS
  Filled 2018-02-18 (×5): qty 0.3

## 2018-02-18 NOTE — Progress Notes (Signed)
Patient's accucheck 30, orange juice given  And sugar recheck at 35. Patient given glucose gel and blood glucose increased to 74, after breakfast patient's sugar is 137.  MD informed and orders given.

## 2018-02-18 NOTE — Progress Notes (Signed)
Paitent's blood sugar 22, glucose gel and orange juice given and patient's blood sugar increased to 100. MD informed and orders given.

## 2018-02-18 NOTE — Progress Notes (Signed)
Physical Therapy Treatment Patient Details Name: Nathaniel [name] MRN: [id number] DOB: March 20, 1986 Today's Date: 02/18/2018    History of Present Illness Nathaniel [name]  is a 32 y.o. male, With history of hypertension, hyperlipidemia, diabetes mellitus came to hospital with complaints of right-sided numbness for past 2 days.  Patient says that he had some weakness of right arm and leg.  Denies any headache.  No facial droop.  No slurred speech. 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     PT Comments    Patient is progressing well with Acute therapy and has improved ability to maintain seated balance/postural control at EOB. He did not require bed rail for support and was able to raise bil UE and maintain midline balance while sitting. He progressed sit to stand transfers and short ambulation with standard walker and now requires Min assist with verbal cues for safe sequencing of step pattern and to increase base of support in standing to prevent LOB. He was able to tolerate sitting in bedside chair at end of session and perform bil LE exercises for strengthening. Patient will benefit from continued physical therapy in hospital and recommended venue below to increase strength, balance, endurance for safe ADLs and gait.    Follow Up Recommendations  CIR;Supervision/Assistance - 24 hour;Supervision for mobility/OOB     Equipment Recommendations  Standard walker    Recommendations for Other Services       Precautions / Restrictions Precautions Precautions: Fall Precaution Comments: Pt fell out of bed in hospital room. Multiple falls past 2 days Restrictions Weight Bearing Restrictions: No    Mobility  Bed Mobility Overal bed mobility: Needs Assistance Bed Mobility: Supine to Sit;Sit to Supine     Supine to sit: Supervision;HOB elevated Sit to supine: Supervision;HOB elevated      Transfers Overall transfer level: Needs assistance Equipment used:  Standard walker Transfers: Sit to/from Stand;Stand Pivot Transfers Sit to Stand: Min assist;Min guard Stand pivot transfers: Min assist;Min guard       General transfer comment: patient with imrpoved steadiness, stands with narrow base and requires verbal cues for Rt LE positioning t improve balance  Ambulation/Gait Ambulation/Gait assistance: Min assist;Min guard Ambulation Distance (Feet): 10 Feet Assistive device: Standard walker Gait Pattern/deviations: Decreased step length - right;Decreased stance time - right;Decreased dorsiflexion - right;Decreased stride length;Scissoring Gait velocity: slow   General Gait Details: patient is progressing well and required min assist/guard with verbal cues to manage standard walker and sequence gait step pattern, improved ability to advance Rt LE        Balance Overall balance assessment: Needs assistance Sitting-balance support: Feet supported;No upper extremity supported Sitting balance-Leahy Scale: Fair Sitting balance - Comments: improving balance/postural control, patient able to raise Bil UE to assist with donning gait belt and with donning/doffing BP cuff durign thearpy session while sitting EOB Postural control: Right lateral lean Standing balance support: During functional activity;Bilateral upper extremity supported Standing balance-Leahy Scale: Fair Standing balance comment: fair when using standard walker, requires assistance to maintain  balance without support for dynamic standing activities       Cognition Arousal/Alertness: Awake/alert Behavior During Therapy: WFL for tasks assessed/performed Overall Cognitive Status: Within Functional Limits for tasks assessed     Exercises General Exercises - Lower Extremity Long Arc Quad: Right;Strengthening;Left;10 reps;Seated Hip Flexion/Marching: Strengthening;Right;Left;10 reps;Seated        Pertinent Vitals/Pain Pain Assessment: No/denies pain           PT Goals  (current goals can  now be found in the care plan section) Acute Rehab PT Goals Patient Stated Goal: return to PLOF PT Goal Formulation: With patient Time For Goal Achievement: 03/03/18 Potential to Achieve Goals: Good Progress towards PT goals: Progressing toward goals    Frequency    7X/week      PT Plan Current plan remains appropriate       AM-PAC PT "6 Clicks" Daily Activity  Outcome Measure  Difficulty turning over in bed (including adjusting bedclothes, sheets and blankets)?: A Little Difficulty moving from lying on back to sitting on the side of the bed? : A Little Difficulty sitting down on and standing up from a chair with arms (e.g., wheelchair, bedside commode, etc,.)?: A Little Help needed moving to and from a bed to chair (including a wheelchair)?: A Little Help needed walking in hospital room?: A Lot Help needed climbing 3-5 steps with a railing? : A Lot 6 Click Score: 16    End of Session Equipment Utilized During Treatment: Gait belt Activity Tolerance: Patient tolerated treatment well Patient left: in chair;with call bell/phone within reach;with chair alarm set(with lunch tray table in front of him) Nurse Communication: Mobility status PT Visit Diagnosis: Unsteadiness on feet (R26.81);Other abnormalities of gait and mobility (R26.89);Muscle weakness (generalized) (M62.81)     Time: 9774-1423 PT Time Calculation (min) (ACUTE ONLY): 24 min  Charges:  $Therapeutic Exercise: 8-22 mins $Therapeutic Activity: 8-22 mins                    G Codes:      Kipp Brood, PT, DPT Physical Therapist with [facility]  02/18/2018 1:04 PM

## 2018-02-19 ENCOUNTER — Inpatient Hospital Stay (HOSPITAL_COMMUNITY): Payer: Medicare HMO

## 2018-02-19 LAB — GLUCOSE, CAPILLARY
GLUCOSE-CAPILLARY: 174 mg/dL — AB (ref 65–99)
GLUCOSE-CAPILLARY: 212 mg/dL — AB (ref 65–99)
Glucose-Capillary: 359 mg/dL — ABNORMAL HIGH (ref 65–99)
Glucose-Capillary: 161 mg/dL — ABNORMAL HIGH (ref 65–99)
Glucose-Capillary: 232 mg/dL — ABNORMAL HIGH (ref 65–99)
Glucose-Capillary: 235 mg/dL — ABNORMAL HIGH (ref 65–99)
Glucose-Capillary: 198 mg/dL — ABNORMAL HIGH (ref 65–99)

## 2018-02-19 LAB — BASIC METABOLIC PANEL
Anion gap: 7 (ref 5–15)
BUN: 11 mg/dL (ref 6–20)
CO2: 24 mmol/L (ref 22–32)
Calcium: 8.8 mg/dL — ABNORMAL LOW (ref 8.9–10.3)
Chloride: 106 mmol/L (ref 101–111)
Creatinine, Ser: 1.34 mg/dL — ABNORMAL HIGH (ref 0.61–1.24)
GFR calc Af Amer: >60 mL/min (ref >60)
GLUCOSE: 197 mg/dL — AB (ref 65–99)
POTASSIUM: 3.7 mmol/L (ref 3.5–5.1)
Sodium: 137 mmol/L (ref 135–145)

## 2018-02-19 LAB — HEPATIC FUNCTION PANEL
ALT: 16 U/L — ABNORMAL LOW (ref 17–63)
AST: 23 U/L (ref 15–41)
Albumin: 3.1 g/dL — ABNORMAL LOW (ref 3.5–5.0)
Alkaline Phosphatase: 76 U/L (ref 38–126)
BILIRUBIN DIRECT: 0.1 mg/dL (ref 0.1–0.5)
BILIRUBIN INDIRECT: 0.6 mg/dL (ref 0.3–0.9)
BILIRUBIN TOTAL: 0.7 mg/dL (ref 0.3–1.2)
Total Protein: 7 g/dL (ref 6.5–8.1)

## 2018-02-19 LAB — LIPID PANEL
CHOL/HDL RATIO: 6.5 ratio
Cholesterol: 260 mg/dL — ABNORMAL HIGH (ref 0–200)
HDL: 40 mg/dL — AB (ref >40)
LDL CALC: 201 mg/dL — AB (ref 0–99)
TRIGLYCERIDES: 93 mg/dL (ref <150)
VLDL: 19 mg/dL (ref 0–40)

## 2018-02-19 MED ORDER — ASPIRIN 325 MG PO TABS: 325.0000 mg | ORAL_TABLET | Freq: Every day | ORAL | Status: DC

## 2018-02-19 MED ORDER — LISINOPRIL 10 MG PO TABS
20.0000 mg | ORAL_TABLET | Freq: Every day | ORAL | Status: DC
  Administered 2018-02-20 – 2018-02-22 (×3): 20 mg via ORAL
  Filled 2018-02-19 (×3): qty 2

## 2018-02-19 MED ORDER — ATORVASTATIN CALCIUM 20 MG PO TABS
20.0000 mg | ORAL_TABLET | Freq: Every day | ORAL | Status: DC
  Administered 2018-02-19 – 2018-02-21 (×3): 20 mg via ORAL
  Filled 2018-02-19 (×3): qty 1

## 2018-02-19 NOTE — Progress Notes (Signed)
Inpatient Diabetes Program Recommendations  AACE/ADA: New Consensus Statement on Inpatient Glycemic Control (2015)  Target Ranges:  Prepandial:   less than 140 mg/dL      Peak postprandial:   less than 180 mg/dL (1-2 hours)      Critically ill patients:  140 - 180 mg/dL   Results for BRACE, WELTE (MRN [id number]) as of 02/19/2018 07:34  Ref. Range 02/18/2018 07:37 02/18/2018 07:58 02/18/2018 08:23 02/18/2018 08:45 02/18/2018 11:15 02/18/2018 16:34 02/18/2018 17:05 02/18/2018 17:28 02/18/2018 22:11 02/19/2018 03:01 02/19/2018 07:21  Glucose-Capillary Latest Ref Range: 65 - 99 mg/dL 30 (LL) 35 (LL) 74 136 (H) 255 (H) 22 (LL) 48 (L) 100 (H) 226 (H) 174 (H) 212 (H)   Review of Glycemic Control  Diabetes history: DM 1 Outpatient Diabetes medications: Levemir 30 units BID Current orders for Inpatient glycemic control: Levemir 30 units BID, Novolog Resistant Correction 0-20 units tid + Novolog Hs scale 0-5 units  Inpatient Diabetes Program Recommendations:    Noted patient had hypoglycemia yesterday morning and Levemir was reduced from 40 units to 30 units BID. Levemir was not given last night. Fasting glucose 212 this am. Please consider Levemir 30 units Daily. Patient had hypoglycemia in the afternoon due to amount of Novolog given over an hour after glucose was obtained. Usually Patients with DM type 1 are sensitive to short acting insulin. Consider decreasing Novolog Correction to Sensitive or Moderate scales. Could always add meal coverage if needed if glucose increases with meals.  Thanks,  Tama Headings RN, MSN, BC-ADM, [facility] Inpatient Diabetes Coordinator Team Pager [phone] (8a-5p)

## 2018-02-19 NOTE — Progress Notes (Signed)
DKA has resolved however the patient relates worsened right lower extremity weakness along with concomitant right upper extremity paresthesias.  His admission CT scan showed a an internal capsule presumably subacute lacunar infarct.  He is insulin-dependent diabetes since age 32 with hypertension and hyperlipidemia.  She was placed on lisinopril and Pravachol as an outpatient along with his Levemir 50 units before meals twice daily.  There is been questionable compliance.  Today his right lower extremity reveals 3-4 out of 5 motor strength in his right upper extremity reveals 4 out of 5 motor strength cranial nerves II through XII grossly intact however his right arm finger-to-nose reveals past-pointing and impaired completion of task.  No MRI available today Sunday she is currently on full dose aspirin 325 mg.  Hemodynamically stable plan is to get MRI MRA of the head at earliest convenience.  Lipitor 20 p.o. daily increase lisinopril from 10 to 20 mg p.o. daily obtain neurology consult likewise obtain 2D echocardiogram to look for any source of embolic phenomena as well as carotid duplex scan. Nathaniel [name] WNI:[id number] DOB: [birth year] DOA: 02/16/2018 PCP: [name], MD   Physical Exam: Blood pressure (!) 146/102, pulse 90, temperature 98.4 F (36.9 C), temperature source Oral, resp. rate 18, height 6\' 2"  (1.88 m), weight 72.6 kg (160 lb), SpO2 100 %.  Lungs clear to A&P no rales wheeze rhonchi heart regular rhythm no S3-S4 no murmurs gallops heaves thrills or rubs neurologic cranial nerves II through XII grossly intact patient alert and oriented and cooperative.  Right lower extremity reveals 5 motor strength right upper extremity reveals 4 out of 5 motor strength pointers are equivocal bilaterally.  Right upper extremity finger-to-nose reveals some past pointing and impaired completion of task   Investigations:  No results found for this or any previous visit (from the past 240  hour(s)).   Basic Metabolic Panel: Recent Labs    02/18/18 0643 02/19/18 0559  NA 141 137  K 3.5 3.7  CL 109 106  CO2 25 24  GLUCOSE 30* 197*  BUN 17 11  CREATININE 1.58* 1.34*  CALCIUM 9.1 8.8*   Liver Function Tests: Recent Labs    02/16/18 1910  AST 21  ALT 19  ALKPHOS 105  BILITOT 2.2*  PROT 8.2*  ALBUMIN 3.8     CBC: Recent Labs    02/16/18 1910  WBC 14.3*  NEUTROABS 11.1*  HGB 13.1  HCT 41.4  MCV 92.4  PLT 310    No results found.    Medications:   Impression:  Active Problems:   Essential hypertension, benign   Type 1 diabetes mellitus with complication (HCC)   DKA (diabetic ketoacidoses) (HCC)   CVA (cerebral vascular accident) [facility])     Plan: Neurology consult.  MRI/MRA of head at earliest convenience.  Aspirin 325 p.o. daily.  Increase lisinopril from 10 mg to 20 mg p.o. daily Lipitor 20 mg p.o. daily.  2D echo and carotid duplex scan ordered.  Consultants: Neurology requested   Procedures   Antibiotics:        Time spent: 1 hour   LOS: 3 days   Myya Meenach M   02/19/2018, 11:36 AM

## 2018-02-19 NOTE — Progress Notes (Signed)
Physical Therapy Treatment Patient Details Name: Nathaniel [name] MRN: [id number] DOB: 1986-03-22 Today's Date: 02/19/2018    History of Present Illness Nathaniel [name]  is a 32 y.o. male, With history of hypertension, hyperlipidemia, diabetes mellitus came to hospital with complaints of right-sided numbness for past 2 days.  Patient says that he had some weakness of right arm and leg.  Denies any headache.  No facial droop.  No slurred speech. 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     PT Comments    Patient continues to progress well with Acute therapy and progressed gait training today with RW. He performed forward/backward gait training with RW and required minimum to moderate assist to maintain balance due to difficulty with coordinating Rt LE movement. He presents with Rt LE dysmetria during gait and requires verbal/visual cues to facilitate proper step length with repeated Rt LE stepping for sequencing/gait training activities. Patient will benefit from continued physical therapy in hospital and recommended venue below to increase strength, balance, endurance for safe ADLs and gait.    Follow Up Recommendations  CIR;Supervision/Assistance - 24 hour;Supervision for mobility/OOB     Equipment Recommendations  Standard walker    Recommendations for Other Services       Precautions / Restrictions Precautions Precautions: Fall Precaution Comments: Pt fell out of bed in hospital room. Multiple falls past 2 days Restrictions Weight Bearing Restrictions: No    Mobility   Transfers Overall transfer level: Needs assistance Equipment used: Rolling walker (2 wheeled) Transfers: Sit to/from Stand Sit to Stand: Min assist;Mod assist      General transfer comment: patient with improved steadiness, continues to stand with narrow base and requires verbal cues for Rt LE positioning to improve balance, up to moderate assist to maintain standing balance due  to Rt leaning, unable to maintain midline without cues  Ambulation/Gait Ambulation/Gait assistance: Min assist;Mod assist Ambulation Distance (Feet): 15 Feet Assistive device: Rolling walker (2 wheeled) Gait Pattern/deviations: Decreased step length - right;Decreased stance time - right;Decreased dorsiflexion - right;Decreased stride length;Scissoring Gait velocity: slow   General Gait Details: patient continues to require min/mod assist with verbal/visual cues to manage RW and sequence gait step pattern, continues to have difficulty with sequencing/coordinating Rt LE movement, demonstrates difficulty with timing and signs of dysmetria       Balance Overall balance assessment: Needs assistance Sitting-balance support: Feet supported;No upper extremity supported Sitting balance-Leahy Scale: Fair Sitting balance - Comments: improving balance/postural control, patient able to raise Bil UE to assist with donning gait belt and with donning/doffing BP cuff during thearpy session while sitting EOB Postural control: Right lateral lean Standing balance support: During functional activity;Bilateral upper extremity supported Standing balance-Leahy Scale: Fair Standing balance comment: fair when using RW, requires assistance to maintain balance without support for dynamic standing activities, verbal uces required to maintain midline standing posture      High level balance activites: Backward walking High Level Balance Comments: moderate assist for backwards walking to deter Rt trunk leaning and cues for sequencing step length     Cognition Arousal/Alertness: Awake/alert Behavior During Therapy: WFL for tasks assessed/performed Overall Cognitive Status: Within Functional Limits for tasks assessed                Pertinent Vitals/Pain Pain Assessment: No/denies pain           PT Goals (current goals can now be found in the care plan section) Acute Rehab PT Goals Patient Stated Goal:  return to  PLOF PT Goal Formulation: With patient Time For Goal Achievement: 03/03/18 Potential to Achieve Goals: Good Progress towards PT goals: Progressing toward goals    Frequency    7X/week      PT Plan Current plan remains appropriate       AM-PAC PT "6 Clicks" Daily Activity  Outcome Measure  Difficulty turning over in bed (including adjusting bedclothes, sheets and blankets)?: A Little Difficulty moving from lying on back to sitting on the side of the bed? : A Little Difficulty sitting down on and standing up from a chair with arms (e.g., wheelchair, bedside commode, etc,.)?: A Little Help needed moving to and from a bed to chair (including a wheelchair)?: A Little Help needed walking in hospital room?: A Lot Help needed climbing 3-5 steps with a railing? : A Lot 6 Click Score: 16    End of Session Equipment Utilized During Treatment: Gait belt Activity Tolerance: Patient tolerated treatment well Patient left: in chair;with call bell/phone within reach;with chair alarm set;with nursing/sitter in room Nurse Communication: Mobility status PT Visit Diagnosis: Unsteadiness on feet (R26.81);Other abnormalities of gait and mobility (R26.89);Muscle weakness (generalized) (M62.81)     Time: 3154-0086 PT Time Calculation (min) (ACUTE ONLY): 38 min  Charges:  $Gait Training: 8-22 mins $Therapeutic Activity: 8-22 mins                    G Codes:       Kipp Brood, PT, DPT Physical Therapist with [facility]  02/19/2018 12:07 PM

## 2018-02-20 ENCOUNTER — Inpatient Hospital Stay (HOSPITAL_COMMUNITY): Payer: Medicare HMO

## 2018-02-20 ENCOUNTER — Other Ambulatory Visit (HOSPITAL_COMMUNITY): Payer: Medicare HMO

## 2018-02-20 LAB — BASIC METABOLIC PANEL
Anion gap: 6 (ref 5–15)
BUN: 15 mg/dL (ref 6–20)
CALCIUM: 9.2 mg/dL (ref 8.9–10.3)
CO2: 26 mmol/L (ref 22–32)
Chloride: 109 mmol/L (ref 101–111)
Creatinine, Ser: 1.51 mg/dL — ABNORMAL HIGH (ref 0.61–1.24)
Glucose, Bld: 44 mg/dL — CL (ref 65–99)
Potassium: 3.8 mmol/L (ref 3.5–5.1)
SODIUM: 141 mmol/L (ref 135–145)

## 2018-02-20 LAB — GLUCOSE, CAPILLARY
GLUCOSE-CAPILLARY: 54 mg/dL — AB (ref 65–99)
GLUCOSE-CAPILLARY: 92 mg/dL (ref 65–99)
GLUCOSE-CAPILLARY: 228 mg/dL — AB (ref 65–99)
GLUCOSE-CAPILLARY: 312 mg/dL — AB (ref 65–99)
Glucose-Capillary: 118 mg/dL — ABNORMAL HIGH (ref 65–99)
Glucose-Capillary: 344 mg/dL — ABNORMAL HIGH (ref 65–99)

## 2018-02-20 LAB — HEMOGLOBIN A1C
HEMOGLOBIN A1C: 10 % — AB (ref 4.8–5.6)
MEAN PLASMA GLUCOSE: 240.3 mg/dL

## 2018-02-20 LAB — VITAMIN B12: Vitamin B-12: 515 pg/mL (ref 180–914)

## 2018-02-20 LAB — TSH: TSH: 1.81 u[IU]/mL (ref 0.350–4.500)

## 2018-02-20 MED ORDER — HYDRALAZINE HCL 20 MG/ML IJ SOLN
10.0000 mg | Freq: Once | INTRAMUSCULAR | Status: AC
  Administered 2018-02-20: 10 mg via INTRAVENOUS
  Filled 2018-02-20: qty 1

## 2018-02-20 MED ORDER — INSULIN DETEMIR 100 UNIT/ML ~~LOC~~ SOLN
25.0000 [IU] | Freq: Every day | SUBCUTANEOUS | Status: DC
  Administered 2018-02-20: 25 [IU] via SUBCUTANEOUS
  Filled 2018-02-20 (×2): qty 0.25

## 2018-02-20 MED ORDER — INSULIN ASPART 100 UNIT/ML ~~LOC~~ SOLN
0.0000 [IU] | Freq: Three times a day (TID) | SUBCUTANEOUS | Status: DC
  Administered 2018-02-20: 7 [IU] via SUBCUTANEOUS

## 2018-02-20 NOTE — Progress Notes (Signed)
[facility] Inpatient rehab admissions - I have opened the case with [facility] carrier requesting acute inpatient rehab admission.  I will follow up once I hear back from insurance case manager.  Call me for questions.  #[phone]

## 2018-02-20 NOTE — Care Management Important Message (Signed)
Important Message  Patient Details  Name: Nathaniel [name] MRN: [id number] Date of Birth: August 28, 1986   Medicare Important Message Given:  Yes    Shelda Altes 02/20/2018, 2:17 PM

## 2018-02-20 NOTE — Progress Notes (Signed)
Physical Therapy Treatment Patient Details Name: Nathaniel [name] MRN: [id number] DOB: [birth year] Today's Date: 02/20/2018    History of Present Illness Nathaniel [name]  is a 32 y.o. male, With history of hypertension, hyperlipidemia, diabetes mellitus came to hospital with complaints of right-sided numbness for past 2 days.  Patient says that he had some weakness of right arm and leg.  Denies any headache.  No facial droop.  No slurred speech. 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     PT Comments    Patient demonstrates good return for keeping trunk in midline with arms crossed while completing BLE ROM/strengthening exercises while seated at bedside, no leaning or use of bed rail required, also increased endurance/distance for gait training with fair/good return for right heel to toe stepping, requires slow cadence for safety and occasional verbal cues to step closer to RW.  Patient tolerated sitting up in chair after therapy - RN notified.  Patient will benefit from continued physical therapy in hospital and recommended venue below to increase strength, balance, endurance for safe ADLs and gait.    Follow Up Recommendations  CIR;Supervision/Assistance - 24 hour;Supervision for mobility/OOB     Equipment Recommendations  Rolling walker with 5" wheels    Recommendations for Other Services       Precautions / Restrictions Precautions Precautions: Fall Precaution Comments: Pt fell out of bed in hospital room. Multiple falls past 2 days Restrictions Weight Bearing Restrictions: No    Mobility  Bed Mobility Overal bed mobility: Needs Assistance Bed Mobility: Supine to Sit     Supine to sit: Supervision     General bed mobility comments: with bed flat  Transfers Overall transfer level: Needs assistance Equipment used: Rolling walker (2 wheeled) Transfers: Sit to/from [facility] Sit to Stand: Min assist Stand pivot transfers:  Min guard;Min assist       General transfer comment: labored movement with incoordination of RLE  Ambulation/Gait Ambulation/Gait assistance: Min assist Ambulation Distance (Feet): 45 Feet Assistive device: Rolling walker (2 wheeled) Gait Pattern/deviations: Decreased step length - right;Decreased stance time - right;Decreased stride length;Ataxic Gait velocity: slow   General Gait Details: demonstrates ataxic like slow labored cadence with improvement for right heel to toe stepping, verbal cues to step closer to RW, increased endurance/distance and able to keep trunk upright   Stairs             Wheelchair Mobility    Modified Rankin (Stroke Patients Only)       Balance Overall balance assessment: Needs assistance Sitting-balance support: Feet supported;No upper extremity supported Sitting balance-Leahy Scale: Fair Sitting balance - Comments: fair/good   Standing balance support: Bilateral upper extremity supported;During functional activity Standing balance-Leahy Scale: Fair Standing balance comment: using RW                            Cognition Arousal/Alertness: Awake/alert Behavior During Therapy: WFL for tasks assessed/performed Overall Cognitive Status: Within Functional Limits for tasks assessed                                        Exercises General Exercises - Lower Extremity Long Arc Quad: Seated;AROM;Strengthening;Both;10 reps Hip Flexion/Marching: Seated;AROM;Strengthening;Both;10 reps Toe Raises: Seated;AROM;Strengthening;10 reps;Both Heel Raises: Seated;AROM;Strengthening;10 reps;Both    General Comments        Pertinent Vitals/Pain Pain Assessment: No/denies pain  Home Living                      Prior Function            PT Goals (current goals can now be found in the care plan section) Acute Rehab PT Goals Patient Stated Goal: return to PLOF PT Goal Formulation: With patient Time For Goal  Achievement: 03/03/18 Potential to Achieve Goals: Good Progress towards PT goals: Progressing toward goals    Frequency    7X/week      PT Plan Current plan remains appropriate    Co-evaluation              AM-PAC PT "6 Clicks" Daily Activity  Outcome Measure  Difficulty turning over in bed (including adjusting bedclothes, sheets and blankets)?: None Difficulty moving from lying on back to sitting on the side of the bed? : None Difficulty sitting down on and standing up from a chair with arms (e.g., wheelchair, bedside commode, etc,.)?: A Little Help needed moving to and from a bed to chair (including a wheelchair)?: A Little Help needed walking in hospital room?: A Lot Help needed climbing 3-5 steps with a railing? : A Lot 6 Click Score: 18    End of Session Equipment Utilized During Treatment: Gait belt Activity Tolerance: Patient tolerated treatment well;Patient limited by fatigue Patient left: in chair;with call bell/phone within reach Nurse Communication: (RN notified that patient left up in chair,) PT Visit Diagnosis: Unsteadiness on feet (R26.81);Other abnormalities of gait and mobility (R26.89);Muscle weakness (generalized) (M62.81)     Time: 5631-4970 PT Time Calculation (min) (ACUTE ONLY): 35 min  Charges:  $Gait Training: 8-22 mins $Therapeutic Exercise: 8-22 mins                    G Codes:       4:16 PM, 03/05/2018 Nathaniel [name], MPT Physical Therapist with [facility] [address] [phone] office [phone] mobile phone

## 2018-02-20 NOTE — Progress Notes (Signed)
Patient has cerebellar infarct and 2 other defects based on MRI a MRI performed yesterday on full dose aspirin Faera glycemic control for type 1 diabetes patient on Lipitor 20 for hyperlipidemia participating in physical therapy awaiting neurology consult at present. Nathaniel [name] OAC:[id number] DOB: [birth year] DOA: 02/16/2018 PCP: Nathaniel Gaskins, MD   Physical Exam: Blood pressure (!) 162/101, pulse 80, temperature 98.6 F (37 C), temperature source Oral, resp. rate 16, height 6\' 2"  (1.88 m), weight 72.6 kg (160 lb), SpO2 100 %.  Lungs clear to A&P no rales wheezes rhonchi heart regular rhythm no S3-S4 no heaves thrills or rubs neurologic exam essentially unchanged yesterday   Investigations:  No results found for this or any previous visit (from the past 240 hour(s)).   Basic Metabolic Panel: Recent Labs    02/19/18 0559 02/20/18 0629  NA 137 141  K 3.7 3.8  CL 106 109  CO2 24 26  GLUCOSE 197* 44*  BUN 11 15  CREATININE 1.34* 1.51*  CALCIUM 8.8* 9.2   Liver Function Tests: Recent Labs    02/19/18 1201  AST 23  ALT 16*  ALKPHOS 76  BILITOT 0.7  PROT 7.0  ALBUMIN 3.1*     CBC: No results for input(s): WBC, NEUTROABS, HGB, HCT, MCV, PLT in the last 72 hours.  [name] WF Contrast  Result Date: 02/20/2018 CLINICAL DATA:  Acute right cerebellar, left thalamic, and left frontal lobe infarcts. Abnormal MRA complicated by motion artifact. Follow-up MRA. EXAM: MRA HEAD WITHOUT CONTRAST TECHNIQUE: Angiographic images of the Circle of Willis were obtained using MRA technique without intravenous contrast. COMPARISON:  MRI and MRA head 02/17/2018 FINDINGS: Atherosclerotic irregularity is present at the cavernous and precavernous internal carotid arteries bilaterally. There is less patient motion on today's study. No significant stenosis is present relative to the more distal vessels. The A1 and M1 segments are normal. No definite anterior communicating artery is present.  MCA bifurcations are intact. ACA and MCA branch vessels are within normal limits. The left vertebral artery is the dominant vessel. PICA origin is visualized and normal. Right AICA is dominant. The basilar artery is normal. Both posterior cerebral arteries originate from basilar tip. There is some attenuation of distal PCA branch vessels bilaterally without a significant proximal stenosis or occlusion. No aneurysms are present. IMPRESSION: 1. Atherosclerotic changes within the cavernous and precavernous internal carotid arteries bilaterally, left greater than right, without significant stenosis relative to the more distal vessels. 2. Mild distal small vessel disease most evident and posterior circulation without other significant proximal stenosis, aneurysm, or branch vessel occlusion. Electronically Signed   By: San Morelle M.D.   On: 02/20/2018 09:11      Medications:  Impression:  Active Problems:   Essential hypertension, benign   Type 1 diabetes mellitus with complication (HCC)   DKA (diabetic ketoacidoses) (HCC)   CVA (cerebral vascular accident) ([place])     Plan: Aspirin 325 Lipitor 20 continue insulin with sliding scale 2D echo being performed for embolic source await neurology consult continue physical therapy  Consultants: Neurology requested   Procedures   Antibiotics:            Time spent: 30 minutes   LOS: 4 days   Iysis Germain M   02/20/2018, 12:54 PM

## 2018-02-20 NOTE — Care Management (Addendum)
Met with patient at bedside. He has been recommended for CIR/SNF. He is agreeable to either, would prefer CIR if possible.  Discussed with Genie -admission coordinator at [facility] She will start insurance authorization.    Anticipate insurance authorization to take a couple of days.   Patient has a cell phone at bedside [phone].

## 2018-02-20 NOTE — Progress Notes (Addendum)
Inpatient Diabetes Program Recommendations  AACE/ADA: New Consensus Statement on Inpatient Glycemic Control (2015)  Target Ranges:  Prepandial:   less than 140 mg/dL      Peak postprandial:   less than 180 mg/dL (1-2 hours)      Critically ill patients:  140 - 180 mg/dL  Results for Nathaniel [name], Nathaniel [name] (MRN [id number]) as of 02/20/2018 07:45  Ref. Range 02/19/2018 07:21 02/19/2018 09:38 02/19/2018 10:43 02/19/2018 11:28 02/19/2018 16:30 02/19/2018 22:50 02/20/2018 07:43  Glucose-Capillary Latest Ref Range: 65 - 99 mg/dL 212 (H) 359 (H)  Novolog 20 units 232 (H) 161 (H)  Novolog 4 units 235 (H)  Novolog 7 units  Levemir 30 units 198 (H) 54 (L)   Results for Nathaniel [name], Nathaniel [name] (MRN [id number]) as of 02/20/2018 07:45  Ref. Range 10/21/2016 23:10 02/17/2018 01:23 02/19/2018 05:59  Hemoglobin A1C Latest Ref Range: 4.8 - 5.6 % 10.3 (H) 10.1 (H) 10.0 (H)   Review of Glycemic Control  Diabetes history: DM1 (makes no insulin; requires basal, correction, and meal coverage insulin) Outpatient Diabetes medications: Levemir 30 units BID Current orders for Inpatient glycemic control: Levemir 30 units BID, Novolog 0-20 units TID with meals, Novolog 0-5 units QHS  Inpatient Diabetes Program Recommendations:  Insulin - Basal: In reviewing the chart, noted patient only received Levemir 30 units once on 02/19/18 and fasting glucose 54 mg/dl today at 7:43. Please consider decreasing Levemir to 25 units daily (once a day). Correction (SSI): Please consider decreasing Novolog correction to sensitive scale (Novolog 0-9 units) TID with meals. Insulin - Meal Coverage: Please consider ordering Novolog 4 units TID with meals for meal coverage if patient eats at least 50% of meals. Outpatient Referral: Recommend patient establish care with Endocrinologist to improve DM control.  NOTE: Patient is noted to have Type 1 diabetes and requires basal, meal coverage, and correction insulin as he makes NO insulin at all. Per home medication  list patient is only taking Levemir 30 units BID for DM control. Appears to be using basal insulin for all insulin needs which is not safe. Patient needs to be taking Novolog or Humalog as an outpatient for meal coverage and for correction when needed along with a reduced dose of basal insulin. Recommend patient establish care with Endocrinologist to assist with improving DM control.  Addendum 02/20/18@13 :1- Spoke with patient about diabetes and home regimen for diabetes control. Patient reports that he is followed by PCP for diabetes management and currently he takes Levemir 50 units BID as an outpatient for diabetes control. Patient reports that he is taking insulin as prescribed. Patient reports frequent hypoglycemic episodes at home and he reports that most of the time he can recognize symptoms of hypoglycemia. However, patient reports that he did not feel any different with glucose of 54 mg/dl this morning.  Patient admits that he does not see his PCP routinely and he has not seen an Endocrinologist in several years. Patient states that he use to see Dr. Dwyane Dee (Endocrinologist). Encouraged patient to re-establish care with Dr. Dwyane Dee or find another Endocrinologist to assist with improve DM control.  Discussed A1C results (10.0% on 02/19/2018) and explained that his current A1C indicates an average glucose of 240 mg/dl over the past 2-3 months. Discussed glucose and A1C goals. Discussed importance of checking CBGs and maintaining good CBG control to prevent long-term and short-term complications. Explained how hyperglycemia leads to damage within blood vessels which lead to the common complications seen with uncontrolled diabetes. Stressed to the patient the importance  of improving glycemic control to prevent further complications from uncontrolled diabetes. Stressed to patient that he is young and uncontrolled DM can lead to further health complications. Patient states that the experience he had with current  stroke has opened his eyes and he plans to make several changes with his health.   Discussed impact of nutrition, exercise, stress, sickness, and medications on diabetes control. Patient states that he tries to stay away from sweets and follow a carb modified diet. Discussed carbohydrates, carbohydrate goals per day and meal, along with portion sizes. Patient reports that he has not been checking his glucose at home because he does not have needed testing supplies. Will ask that patient be provided with a prescription for a new glucometer and testing supplies at time of discharge. Patient states that he is planning to go to CIR if he gets accepted. Encouraged patient to stay informed about DM management orders. Discussed typical insulin regimen for DM1 using basal, meal coverage, and correction insulin.  Discussed using long acting insulin just for basal needs and short acting insulin for meal coverage and correction. Anticipate patient is experiencing frequent hypoglycemia due to too much basal insulin (using basal insulin for all insulin needs).  Would recommend patient be prescribed a reduced dose of basal insulin and adding meal coverage and correction as an outpatient to improve DM control. Encouraged patient to check his glucose 4 times per day (before meals and at bedtime) and to keep a log book of glucose readings and insulin taken which he will need to take to doctor appointments. Explained how the doctor he follows up with can use the log book to continue to make insulin adjustments if needed. Patient verbalized understanding of information discussed and he states that he has no further questions at this time related to diabetes.  Thanks, Barnie Alderman, RN, MSN, CDE Diabetes Coordinator Inpatient Diabetes Program [phone] (Team Pager from 8am to 5pm)

## 2018-02-20 NOTE — Consult Note (Signed)
[place] A. Merlene Laughter, MD     [email or web address]          Nathaniel [name] is an 32 y.o. male.   ASSESSMENT/PLAN: 1. Acute left thalamic lacunar infarct: Risk factors long-standing history of insulin-dependent diabetes mellitus, hypertension and dyslipidemia.  Recommend aspirin 162 mg daily.  Compliance with medical treatment and control of hypertension and diabetes recommended.  Agree with the use of statin medication.  Follow-up echocardiography.    Additional labs will be obtained for RPR, homocystine level a and the vitamin B12. 2. Subacute left frontal infarct and the right cerebral infarct also lacunar type:  The plan as outlined above. The patient has infarcts in 2 different vascular distribution, the low infarcts are lacunar and unlikely to be embolic. 3. Remote cerebral infarcts lacunar type: 4. Diabetic neuropathy     The patient is a 32 year old right-handed black male who presents with a 2 day history of numbness involving the right upper extremity.  He also reports having gait instability and weakness of the right leg.  He does not report having loss of consciousness, dizziness, dysarthria or dysphagia.  He presented with the super high glucose of 840 and in DKA.  Blood pressure also has been high.  The patient has not had focal an deficit or strokes in the past.    He does not report having dyspnea, chest pain or other symptoms. The review of systems otherwise negative.     GENERAL:    This is a pleasant thin male in no acute distress.  HEENT:   This is normal.  ABDOMEN: Soft  EXTREMITIES: No edema   BACK: Normal alignment.  SKIN: Normal by inspection.    MENTAL STATUS: Alert and oriented -   Including orientation to his age and the month. Speech, language and cognition are generally intact. Judgment and insight normal.   CRANIAL NERVES: Pupils are equal, round and reactive to light and accommodation; extraocular movements are full, there is no  significant nystagmus; upper and lower facial muscles are normal in strength and symmetric, there is no flattening of the nasolabial folds; tongue is midline; uvula is midline; shoulder elevation is normal.  MOTOR: Normal tone, bulk and strength; no pronator drift.  COORDINATION: Left finger to nose is normal, right finger to nose is normal, No rest tremor; no intention tremor; no postural tremor; no bradykinesia.  REFLEXES: Deep tendon reflexes are symmetrical but mildly diminished throughout. Plantar responses are flexor bilaterally.   SENSATION:   There is reduced sensation to temperature and light touch involving the right upper extremity.  No clear extinction noted.    Blood pressure (!) 162/101, pulse 80, temperature 98.6 F (37 C), temperature source Oral, resp. rate 16, height _0  (1.88 m), weight 160 lb (72.6 kg), SpO2 100 %.  Past Medical History:  Diagnosis Date  . Diabetes mellitus    lantus/novolog  . Hyperlipidemia   . Hypertension   . Marijuana abuse    occaisionally  . Noncompliance with medication regimen   . Tobacco abuse    5/day    Past Surgical History:  Procedure Laterality Date  . EYE SURGERY      Family History  Problem Relation Age of Onset  . Diabetes Father   . Kidney failure Father        last 4 mnths of life  . Hypertension Mother     Social History:  reports that he has been smoking cigarettes.  He has a 2.00 pack-year smoking  history. He has never used smokeless tobacco. He reports that he has current or past drug history. Drug: Marijuana. He reports that he does not drink alcohol.  Allergies:  Allergies  Allergen Reactions  . Orange     Increases blood sugar immediately     Medications: Prior to Admission medications   Medication Sig Start Date End Date Taking? Authorizing Provider  amLODipine (NORVASC) 5 MG tablet Take 1 tablet (5 mg total) by mouth daily. 04/17/16  Yes [name], Richard, MD  LEVEMIR FLEXTOUCH 100 UNIT/ML Pen  INJECT 30 UNITS SUBCUTNEOUSLY A.C. Bid , Breakfast and supper. Patient taking differently: Inject 30 Units into the skin 2 (two) times daily. INJECT [place]. Bid , Breakfast and supper. 04/17/16  Yes [name], Delfino Lovett, MD  lisinopril (PRINIVIL,ZESTRIL) 10 MG tablet Take 1 tablet (10 mg total) by mouth daily. 04/04/15  Yes [name], Richard, MD  naproxen (NAPROSYN) 500 MG tablet Take 500 mg by mouth 2 (two) times daily. 09/08/16  Yes [provider]    Scheduled Meds: .  stroke: mapping our early stages of recovery book   Does not apply Once  . aspirin  300 mg Rectal Daily   Or  . aspirin  325 mg Oral Daily  . atorvastatin  20 mg Oral q1800  . enoxaparin (LOVENOX) injection  40 mg Subcutaneous Q24H  . insulin aspart  0-9 Units Subcutaneous TID WC  . insulin detemir  25 Units Subcutaneous Q supper  . lisinopril  20 mg Oral Daily   Continuous Infusions: . sodium chloride 100 mL/hr at 02/19/18 0620  . dextrose 5 % and 0.45% NaCl 75 mL/hr at 02/20/18 0832   PRN Meds:.acetaminophen **OR** acetaminophen (TYLENOL) oral liquid 160 mg/5 mL **OR** acetaminophen, hydrALAZINE, senna-docusate     Results for orders placed or performed during the hospital encounter of 02/16/18 (from the past 48 hour(s))  Glucose, capillary     Status: Abnormal   Collection Time: 02/18/18  4:34 PM  Result Value Ref Range   Glucose-Capillary 22 (LL) 65 - 99 mg/dL   Comment 1 Notify RN    Comment 2 Document in Chart    Comment 3 Repeat Test   Glucose, capillary     Status: Abnormal   Collection Time: 02/18/18  5:05 PM  Result Value Ref Range   Glucose-Capillary 48 (L) 65 - 99 mg/dL   Comment 1 Notify RN   Glucose, capillary     Status: Abnormal   Collection Time: 02/18/18  5:28 PM  Result Value Ref Range   Glucose-Capillary 100 (H) 65 - 99 mg/dL   Comment 1 Notify RN   Glucose, capillary     Status: Abnormal   Collection Time: 02/18/18 10:11 PM  Result Value Ref Range    Glucose-Capillary 226 (H) 65 - 99 mg/dL   Comment 1 Notify RN    Comment 2 Document in Chart   Glucose, capillary     Status: Abnormal   Collection Time: 02/19/18  3:01 AM  Result Value Ref Range   Glucose-Capillary 174 (H) 65 - 99 mg/dL   Comment 1 Notify RN    Comment 2 Document in Chart   Basic metabolic panel     Status: Abnormal   Collection Time: 02/19/18  5:59 AM  Result Value Ref Range   Sodium 137 135 - 145 mmol/L   Potassium 3.7 3.5 - 5.1 mmol/L   Chloride 106 101 - 111 mmol/L   CO2 24 22 - 32 mmol/L  Glucose, Bld 197 (H) 65 - 99 mg/dL   BUN 11 6 - 20 mg/dL   Creatinine, Ser 1.34 (H) 0.61 - 1.24 mg/dL   Calcium 8.8 (L) 8.9 - 10.3 mg/dL   GFR calc non Af Amer >60 >60 mL/min   GFR calc Af Amer >60 >60 mL/min    Comment: (NOTE) The eGFR has been calculated using the CKD EPI equation. This calculation has not been validated in all clinical situations. eGFR's persistently <60 mL/min signify possible Chronic Kidney Disease.    Anion gap 7 5 - 15    Comment: Performed at [facility], [address] [place], [place] [address]  Hemoglobin A1c     Status: Abnormal   Collection Time: 02/19/18  5:59 AM  Result Value Ref Range   Hgb A1c MFr Bld 10.0 (H) 4.8 - 5.6 %    Comment: (NOTE) Pre diabetes:          5.7%-6.4% Diabetes:              >6.4% Glycemic control for   <7.0% adults with diabetes    Mean Plasma Glucose 240.3 mg/dL    Comment: Performed at [facility] [address], [place], [place] [address]  Lipid panel     Status: Abnormal   Collection Time: 02/19/18  5:59 AM  Result Value Ref Range   Cholesterol 260 (H) 0 - 200 mg/dL   Triglycerides 93 <150 mg/dL   HDL 40 (L) >40 mg/dL   Total CHOL/HDL Ratio 6.5 RATIO   VLDL 19 0 - 40 mg/dL   LDL Cholesterol 201 (H) 0 - 99 mg/dL    Comment:        Total Cholesterol/HDL:CHD Risk Coronary Heart Disease Risk Table                     Men   Women  1/2 Average Risk   3.4   3.3  Average Risk       5.0   4.4   2 X Average Risk   9.6   7.1  3 X Average Risk  23.4   11.0        Use the calculated Patient Ratio above and the CHD Risk Table to determine the patient's CHD Risk.        ATP III CLASSIFICATION (LDL):  <100     mg/dL   Optimal  100-129  mg/dL   Near or Above                    Optimal  130-159  mg/dL   Borderline  160-189  mg/dL   High  >190     mg/dL   Very High Performed at [facility] [place], [place] [address]   Glucose, capillary     Status: Abnormal   Collection Time: 02/19/18  7:21 AM  Result Value Ref Range   Glucose-Capillary 212 (H) 65 - 99 mg/dL  Glucose, capillary     Status: Abnormal   Collection Time: 02/19/18  9:38 AM  Result Value Ref Range   Glucose-Capillary 359 (H) 65 - 99 mg/dL   Comment 1 Notify RN   Glucose, capillary     Status: Abnormal   Collection Time: 02/19/18 10:43 AM  Result Value Ref Range   Glucose-Capillary 232 (H) 65 - 99 mg/dL   Comment 1 Notify RN   Glucose, capillary     Status: Abnormal   Collection Time: 02/19/18  11:28 AM  Result Value Ref Range   Glucose-Capillary 161 (H) 65 - 99 mg/dL  Hepatic function panel     Status: Abnormal   Collection Time: 02/19/18 12:01 PM  Result Value Ref Range   Total Protein 7.0 6.5 - 8.1 g/dL   Albumin 3.1 (L) 3.5 - 5.0 g/dL   AST 23 15 - 41 U/L   ALT 16 (L) 17 - 63 U/L   Alkaline Phosphatase 76 38 - 126 U/L   Total Bilirubin 0.7 0.3 - 1.2 mg/dL   Bilirubin, Direct 0.1 0.1 - 0.5 mg/dL   Indirect Bilirubin 0.6 0.3 - 0.9 mg/dL    Comment: Performed at [facility], [address], [place], [place] [address]  Glucose, capillary     Status: Abnormal   Collection Time: 02/19/18  4:30 PM  Result Value Ref Range   Glucose-Capillary 235 (H) 65 - 99 mg/dL   Comment 1 Notify RN    Comment 2 Document in Chart   Glucose, capillary     Status: Abnormal   Collection Time: 02/19/18 10:50 PM  Result Value Ref Range   Glucose-Capillary 198 (H) 65 - 99 mg/dL   Comment 1 Notify RN     Comment 2 Document in Chart   Basic metabolic panel     Status: Abnormal   Collection Time: 02/20/18  6:29 AM  Result Value Ref Range   Sodium 141 135 - 145 mmol/L   Potassium 3.8 3.5 - 5.1 mmol/L   Chloride 109 101 - 111 mmol/L   CO2 26 22 - 32 mmol/L   Glucose, Bld 44 (LL) 65 - 99 mg/dL    Comment: CRITICAL RESULT CALLED TO, READ BACK BY AND VERIFIED WITH: DISHMON,M AT 7:35AM ON 02/20/18 BY FESTERMAN,C    BUN 15 6 - 20 mg/dL   Creatinine, Ser 1.51 (H) 0.61 - 1.24 mg/dL   Calcium 9.2 8.9 - 10.3 mg/dL   GFR calc non Af Amer >60 >60 mL/min   GFR calc Af Amer >60 >60 mL/min    Comment: (NOTE) The eGFR has been calculated using the CKD EPI equation. This calculation has not been validated in all clinical situations. eGFR's persistently <60 mL/min signify possible Chronic Kidney Disease.    Anion gap 6 5 - 15    Comment: Performed at [facility], [address], [place], [place] [address]  Glucose, capillary     Status: Abnormal   Collection Time: 02/20/18  7:43 AM  Result Value Ref Range   Glucose-Capillary 54 (L) 65 - 99 mg/dL  Glucose, capillary     Status: None   Collection Time: 02/20/18  8:07 AM  Result Value Ref Range   Glucose-Capillary 92 65 - 99 mg/dL  Glucose, capillary     Status: Abnormal   Collection Time: 02/20/18  9:17 AM  Result Value Ref Range   Glucose-Capillary 118 (H) 65 - 99 mg/dL  Glucose, capillary     Status: Abnormal   Collection Time: 02/20/18 11:35 AM  Result Value Ref Range   Glucose-Capillary 228 (H) 65 - 99 mg/dL    Studies/Results:  BRAIN MRI MRA FINDINGS: MRI HEAD FINDINGS  Brain: Oval 17 millimeter focus of restricted diffusion in the lateral left thalamus and posterior limb left internal capsule as seen on series 5, image 49. Associated T2 and FLAIR hyperintensity. No hemorrhage or mass effect.  There are also 2 smaller, up to 10 millimeters, subcortical white matter foci of restricted diffusion in the anterior left frontal lobe  (  series 3, image 82). Similar T2 and FLAIR hyperintensity with no hemorrhage or mass effect.  Furthermore, there is a 12 millimeter area of abnormal trace diffusion in the right superior cerebellum (series 3, image 73) which appears T2 and FLAIR hyperintense but isointense on ADC compatible with a small subacute infarct. No hemorrhage or mass effect.  Multiple chronic small lacunar infarcts bilateral cerebellum and dorsal right pons are new since 2013. Outside of the acute findings the deep gray matter nuclei remain normal. Periventricular white matter T2 and FLAIR hyperintensity has progressed since 2013 outside of the acute areas, including the right periatrial white matter on series 13, image 25. No cerebral cortical encephalomalacia identified. No chronic cerebral blood products identified.  No midline shift, mass effect, evidence of mass lesion, ventriculomegaly, extra-axial collection or acute intracranial hemorrhage. Cervicomedullary junction and pituitary are within normal limits.  Vascular: Major intracranial vascular flow voids are stable since 2015.  Skull and upper cervical spine: Negative visible cervical spine. Visualized bone marrow signal is within normal limits.  Sinuses/Orbits: Stable orbits soft tissues with postoperative changes to both globes. Mild paranasal sinus mucosal thickening has regressed.  Other: Chronic left mastoid effusion is stable to mildly improved. Trace right mastoid fluid is stable. Grossly normal visible internal auditory structures. Grossly negative scalp and face soft tissues.  MRA HEAD FINDINGS  Antegrade flow in the posterior circulation with codominant distal vertebral arteries. No stenosis. Normal left PICA origin. The right AICA may be dominant. Patent vertebrobasilar junction and basilar artery without stenosis. Patent SCA and PCA origins. Both posterior communicating arteries are present. Bilateral PCA branches  are within normal limits.  Antegrade flow in both ICA siphons with atherosclerotic versus artifactual bilateral cavernous and proximal supraclinoid siphon irregularity and narrowing (series 103, image 7). Normal ophthalmic and posterior communicating artery origins. Normal appearance of the supraclinoid ICAs. Patent carotid termini. Normal MCA and ACA origins. Anterior communicating artery and visible ACA branches are normal. The left MCA bifurcates early. The left M1, bifurcation, and visible left MCA branches are within normal limits. The right MCA M1, bifurcation, and visible right MCA branches are within normal limits.  IMPRESSION: 1. Acute lacunar infarct of the Left lateral thalamus/internal capsule corresponding to the CT finding yesterday. No associated hemorrhage or mass effect. 2. Additional small acute lacunar infarcts in the anterior left frontal lobe subcortical white matter (Left MCA territory). Subacute infarct of the Right superior cerebellum (Right SCA territory). No hemorrhage or mass effect. 3. Multiple underlying chronic lacunar infarcts in the cerebellum and the right pons are new since 2015. 4. Intracranial MRA remarkable for moderate irregularity and stenosis of the bilateral cavernous ICAs. This is suspicious for advanced ICA atherosclerosis in this clinical setting, but might be exacerbated by artifact from hyperplastic sphenoid paranasal sinuses.   The brain MRI and MRA reviewed impression.  No significant stenosis/disease or aneurysms are seen intracranially.  There is acute signal increase involving the lateral aspect of the left thalamic area and also the left frontal region.  There is similar as seen increase of involving the right cerebellum.  Only the left thalamic region however associated with reduced signal on the ADC scan.  There is small lucency involving the left inferior cerebellum in the right inner cerebellar region suggestive of possible  chronic lacunar infarcts.     Sohana Austell A. Merlene Laughter, M.D.  Diplomate, Tax adviser of Psychiatry and Neurology ( Neurology). 02/20/2018, 1:35 PM

## 2018-02-20 NOTE — Evaluation (Signed)
Speech Language Pathology Evaluation Patient Details Name: Nathaniel [name] MRN: [id number] DOB: January 03, 1986 Today's Date: 02/20/2018 Time: 2947-6546 SLP Time Calculation (min) (ACUTE ONLY): 19 min  Problem List:  Patient Active Problem List   Diagnosis Date Noted  . DKA (diabetic ketoacidoses) ([place]) 02/16/2018  . CVA (cerebral vascular accident) ([place]) 02/16/2018  . Acute renal failure (ARF) ([place]) 10/21/2016  . Diabetic ketoacidosis without coma associated with type 1 diabetes mellitus ([place])   . Hyperbilirubinemia   . Hyponatremia 04/15/2016  . DM (diabetes mellitus) type 1, uncontrolled, with ketoacidosis ([place]) 04/15/2016  . Hyperglycemia 04/15/2016  . AKI (acute kidney injury) ([place]) 01/04/2016  . Malignant hypertension 01/04/2016  . Noncompliance with medications 01/04/2016  . Intractable nausea and vomiting 04/01/2015  . Gastroparesis 04/01/2015  . Type 1 diabetes mellitus with complication ([place]) 50/35/4656  . Chronic hypertension   . Nausea with vomiting   . Abscess, gluteal, right 06/21/2014  . Nausea and vomiting 04/08/2013  . Hyperglycemia without ketosis 04/08/2013  . Essential hypertension, benign 04/08/2013  . Hypercalcemia 07/29/2011  . Vomiting 07/28/2011  . Leukocytosis 07/28/2011  . Hypokalemia 07/28/2011  . DKA, type 1 ([place]) 07/27/2011  . Dehydration 07/27/2011  . Tobacco abuse 07/27/2011  . Marijuana abuse 07/27/2011   Past Medical History:  Past Medical History:  Diagnosis Date  . Diabetes mellitus    lantus/novolog  . Hyperlipidemia   . Hypertension   . Marijuana abuse    occaisionally  . Noncompliance with medication regimen   . Tobacco abuse    5/day   Past Surgical History:  Past Surgical History:  Procedure Laterality Date  . EYE SURGERY     HPI:  Nathaniel [name]  is a 32 y.o. male, With history of hypertension, hyperlipidemia, diabetes mellitus came to hospital with complaints of right-sided numbness for past 2 days.  Patient says  that he had some weakness of right arm and leg.  Denies any headache.  No facial droop.  No slurred speech. 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   Assessment / Plan / Recommendation Clinical Impression  Pt demonstrates WNL cognitive linguistic skills at this time without changes to speech, language, orientation, memory, or problem solving skills. Pt was initially disoriented upon arrival to the hospital, however he and his family confirm return to baseline. No further SLP services indicated at this time. Pt and family in agreement with plan of care. SLP will sign off at this time. Reconsult if indicated.     SLP Assessment  SLP Recommendation/Assessment: Patient does not need any further Speech Lanaguage Pathology Services SLP Visit Diagnosis: Cognitive communication deficit (R41.841)    Follow Up Recommendations  None    Frequency and Duration           SLP Evaluation Cognition  Overall Cognitive Status: Within Functional Limits for tasks assessed Arousal/Alertness: Awake/alert Orientation Level: Oriented X4 Memory: Appears intact Awareness: Appears intact Problem Solving: Appears intact Safety/Judgment: Appears intact       Comprehension  Auditory Comprehension Overall Auditory Comprehension: Appears within functional limits for tasks assessed Yes/No Questions: Within Functional Limits Commands: Within Functional Limits Conversation: Complex Visual Recognition/Discrimination Discrimination: Within Function Limits Reading Comprehension Reading Status: Not tested    Expression Expression Primary Mode of Expression: Verbal Verbal Expression Overall Verbal Expression: Appears within functional limits for tasks assessed Initiation: No impairment Automatic Speech: Name;Social Response Level of Generative/Spontaneous Verbalization: Conversation Repetition: No impairment Naming: No impairment Pragmatics: No impairment Non-Verbal Means of  Communication: Not applicable  Written Expression Dominant Hand: Right Written Expression: Not tested   Oral / Motor  Oral Motor/Sensory Function Overall Oral Motor/Sensory Function: Within functional limits Motor Speech Overall Motor Speech: Appears within functional limits for tasks assessed Respiration: Within functional limits Phonation: Normal Resonance: Within functional limits Articulation: Within functional limitis Intelligibility: Intelligible Motor Planning: Witnin functional limits Motor Speech Errors: Not applicable   Thank you,  Genene Churn, [place]                     [place] 02/20/2018, 8:07 PM

## 2018-02-21 LAB — RPR, QUANT+TP ABS (REFLEX)
Rapid Plasma Reagin, Quant: 1:2 {titer} — ABNORMAL HIGH
T Pallidum Abs: POSITIVE — AB

## 2018-02-21 LAB — GLUCOSE, CAPILLARY
GLUCOSE-CAPILLARY: 168 mg/dL — AB (ref 65–99)
GLUCOSE-CAPILLARY: 192 mg/dL — AB (ref 65–99)
GLUCOSE-CAPILLARY: 81 mg/dL (ref 65–99)
Glucose-Capillary: 31 mg/dL — CL (ref 65–99)
Glucose-Capillary: 170 mg/dL — ABNORMAL HIGH (ref 65–99)
Glucose-Capillary: 305 mg/dL — ABNORMAL HIGH (ref 65–99)

## 2018-02-21 LAB — HOMOCYSTEINE: Homocysteine: 9.1 umol/L (ref 0.0–15.0)

## 2018-02-21 LAB — RPR: RPR Ser Ql: REACTIVE — AB

## 2018-02-21 MED ORDER — INSULIN ASPART 100 UNIT/ML ~~LOC~~ SOLN: 0.0000 [IU] | Freq: Every day | SUBCUTANEOUS | Status: DC

## 2018-02-21 MED ORDER — INSULIN DETEMIR 100 UNIT/ML ~~LOC~~ SOLN
21.0000 [IU] | Freq: Every day | SUBCUTANEOUS | Status: DC
  Administered 2018-02-21: 21 [IU] via SUBCUTANEOUS
  Filled 2018-02-21 (×2): qty 0.21

## 2018-02-21 MED ORDER — DEXTROSE 50 % IV SOLN
INTRAVENOUS | Status: AC
  Administered 2018-02-21: 08:00:00
  Filled 2018-02-21: qty 50

## 2018-02-21 MED ORDER — INSULIN ASPART 100 UNIT/ML ~~LOC~~ SOLN
0.0000 [IU] | Freq: Three times a day (TID) | SUBCUTANEOUS | Status: DC
  Administered 2018-02-21: 7 [IU] via SUBCUTANEOUS
  Administered 2018-02-21: 2 [IU] via SUBCUTANEOUS
  Administered 2018-02-22: 9 [IU] via SUBCUTANEOUS

## 2018-02-21 MED ORDER — INSULIN ASPART 100 UNIT/ML ~~LOC~~ SOLN
6.0000 [IU] | Freq: Three times a day (TID) | SUBCUTANEOUS | Status: DC
  Administered 2018-02-21 – 2018-02-22 (×3): 6 [IU] via SUBCUTANEOUS

## 2018-02-21 MED ORDER — ASPIRIN 300 MG RE SUPP: 300.0000 mg | Freq: Every day | RECTAL | Status: DC

## 2018-02-21 MED ORDER — ASPIRIN 325 MG PO TABS
162.0000 mg | ORAL_TABLET | Freq: Every day | ORAL | Status: DC
  Administered 2018-02-21 – 2018-02-22 (×2): 162 mg via ORAL
  Filled 2018-02-21 (×2): qty 1

## 2018-02-21 NOTE — Progress Notes (Signed)
Occupational Therapy Treatment Patient Details Name: Nathaniel [name] MRN: [id number] DOB: 1986/04/02 Today's Date: 02/21/2018    History of present illness Nathaniel [name]  is a 32 y.o. male, With history of hypertension, hyperlipidemia, diabetes mellitus came to hospital with complaints of right-sided numbness for past 2 days.  Patient says that he had some weakness of right arm and leg.  Denies any headache.  No facial droop.  No slurred speech. 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    OT comments  Pt received supine in bed, agreeable to OT treatment session. Pt demonstrating improvement in RUE fine and gross motor coordination during functional tasks this am. Pt volitionally using RUE to operate call light/tv remote, is able to maintain appropriate grasp on walker, and use RUE to push up from bed and reach for chair arm with minimal difficulty. Fine motor coordination slightly improved-pt able to open package using tip pinch with both hands, once open using left hand to break and eat cracker. Educated pt on fine motor exercises to practice throughout the day. Discharge to SNF remaining appropriate.    Follow Up Recommendations  CIR    Equipment Recommendations  None recommended by OT       Precautions / Restrictions Precautions Precautions: Fall Precaution Comments: Pt fell out of bed in hospital room. Multiple falls past 2 days Restrictions Weight Bearing Restrictions: No       Mobility Bed Mobility Overal bed mobility: Needs Assistance Bed Mobility: Supine to Sit     Supine to sit: Supervision     General bed mobility comments: with bed flat  Transfers Overall transfer level: Needs assistance Equipment used: Rolling walker (2 wheeled) Transfers: Sit to/from [facility] Sit to Stand: Min assist Stand pivot transfers: Min assist       General transfer comment: increased time, cuing for sequencing        ADL  either performed or assessed with clinical judgement   ADL Overall ADL's : Needs assistance/impaired Eating/Feeding: Supervision/ safety;Sitting Eating/Feeding Details (indicate cue type and reason): Pt able to open package of graham crackers using both hands. Held and drank from cup with right hand             Upper Body Dressing : Min guard;Sitting Upper Body Dressing Details (indicate cue type and reason): Increased time due to right coordination deficits                   General ADL Comments: Did not attempt standing tasks due to pt's low BG     Vision   Vision Assessment?: No apparent visual deficits          Cognition Arousal/Alertness: Awake/alert Behavior During Therapy: WFL for tasks assessed/performed Overall Cognitive Status: Within Functional Limits for tasks assessed                                          Exercises Exercises: Other exercises Other Exercises Other Exercises: fine motor coordination task-towel crumple            Pertinent Vitals/ Pain       Pain Assessment: No/denies pain         Frequency  Min 2X/week        Progress Toward Goals  OT Goals(current goals can now be found in the care plan section)  Progress towards OT goals: Progressing toward  goals  Acute Rehab OT Goals Patient Stated Goal: return to PLOF OT Goal Formulation: With patient Time For Goal Achievement: 03/03/18 Potential to Achieve Goals: Good ADL Goals Pt Will Perform Grooming: with min guard assist;sitting;standing Pt Will Perform Upper Body Dressing: with supervision;sitting Pt Will Perform Lower Body Dressing: with min guard assist;sitting/lateral leans;sit to/from stand Pt Will Transfer to Toilet: with min assist;regular height toilet;bedside commode;ambulating;stand pivot transfer Pt Will Perform Toileting - Clothing Manipulation and hygiene: with supervision;with min guard assist;sitting/lateral leans;sit to/from  stand Pt/caregiver will Perform Home Exercise Program: Increased strength;Left upper extremity;Independently;With written HEP provided  Plan Discharge plan remains appropriate          End of Session Equipment Utilized During Treatment: Gait belt;Rolling walker  OT Visit Diagnosis: Unsteadiness on feet (R26.81);Muscle weakness (generalized) (M62.81);History of falling (Z91.81);Hemiplegia and hemiparesis Hemiplegia - Right/Left: Right Hemiplegia - dominant/non-dominant: Dominant Hemiplegia - caused by: Cerebral infarction   Activity Tolerance Patient tolerated treatment well   Patient Left in chair;with call bell/phone within reach;with chair alarm set           Time: 3159-4585 OT Time Calculation (min): 29 min  Charges: OT General Charges $OT Visit: 1 Visit OT Treatments $Self Care/Home Management : 23-37 mins    Nathaniel [name], OTR/L  [phone] 02/21/2018, 8:17 AM

## 2018-02-21 NOTE — Progress Notes (Signed)
Physical Therapy Treatment Patient Details Name: Nathaniel [name] MRN: [id number] DOB: 02/09/1986 Today's Date: 02/21/2018    History of Present Illness Nathaniel [name]  is a 32 y.o. male, With history of hypertension, hyperlipidemia, diabetes mellitus came to hospital with complaints of right-sided numbness for past 2 days.  Patient says that he had some weakness of right arm and leg.  Denies any headache.  No facial droop.  No slurred speech. 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     PT Comments    Pt sitting in chair.  Agreeable to therapy.  Required min assist to stand for general safety and cues for UE placement/technique.  Pt ambulated further today, approx 100 feet with RW, however took approx 24 minutes to complete due to diffiuculty with LE advancement, Rt LE with decrease proprioception and overall reduced stability.  Pt also takes increased time to make turns within AD and cues for safety, keeping body within walker base of support.  PT returned to chair at conclusion of session.   Verbalized awareness not to get up without assistance.  Chair alarm in place.            Precautions / Restrictions Precautions Precautions: Fall    Mobility  Bed Mobility  Pt was sitting in chair upon arrival.                Transfers Overall transfer level: Needs assistance Equipment used: Rolling walker (2 wheeled) Transfers: Sit to/from [facility] Sit to Stand: Min assist Stand pivot transfers: Min assist       General transfer comment: increased time, cuing for sequencing  Ambulation/Gait Ambulation/Gait assistance: Min assist Ambulation Distance (Feet): 100 Feet Assistive device: Rolling walker (2 wheeled) Gait Pattern/deviations: Decreased step length - right;Decreased stance time - right;Decreased stride length;Ataxic Gait velocity: slow   General Gait Details: demonstrates ataxic like slow labored cadence with improvement  for right heel to toe stepping, verbal cues to step closer to RW, increased endurance/distance and able to keep trunk upright          Cognition Arousal/Alertness: Awake/alert Behavior During Therapy: WFL for tasks assessed/performed Overall Cognitive Status: Within Functional Limits for tasks assessed                                               Pertinent Vitals/Pain Pain Assessment: No/denies pain           PT Goals (current goals can now be found in the care plan section)      Frequency    7X/week      PT Plan  continue progression toward goals.        AM-PAC PT "6 Clicks" Daily Activity  Outcome Measure  Difficulty turning over in bed (including adjusting bedclothes, sheets and blankets)?: None Difficulty moving from lying on back to sitting on the side of the bed? : None Difficulty sitting down on and standing up from a chair with arms (e.g., wheelchair, bedside commode, etc,.)?: A Little Help needed moving to and from a bed to chair (including a wheelchair)?: A Little Help needed walking in hospital room?: A Lot Help needed climbing 3-5 steps with a railing? : A Lot 6 Click Score: 18    End of Session Equipment Utilized During Treatment: Gait belt Activity Tolerance: Patient tolerated treatment well;Patient limited by fatigue Patient  left: in chair;with call bell/phone within reach;with chair alarm set   PT Visit Diagnosis: Unsteadiness on feet (R26.81);Other abnormalities of gait and mobility (R26.89);Muscle weakness (generalized) (M62.81)     Time: 9629-5284 PT Time Calculation (min) (ACUTE ONLY): 26 min  Charges:  $Gait Training: 23-37 mins                       Teena Irani, PTA/CLT [place], [name] 02/21/2018, 5:05 PM

## 2018-02-21 NOTE — PMR Pre-admission (Signed)
[facility] PMR Admission Coordinator Pre-Admission Assessment  Patient: Nathaniel [name] is an 32 y.o., male MRN: [id number] DOB: [birth year] Height: _0  (188 cm) Weight: 72.6 kg (160 lb)  Insurance Information HMO:     PPO: Yes     PCP:       IPA:       80/20:       OTHER:   PRIMARY: [employer] Medicare      Policy#: [id number]      Subscriber: patient CM Name:  Leeroy Bock      Phone#: [phone]     Fax#: [phone] Pre-Cert#: [id number] for 7 days with update due on 02/27/18      Employer:  Not employed/ Disabled Benefits:  Phone #: [phone]     Name: On line portal Eff. Date: 09/20/17     Deduct:  $0      Out of Pocket Max: $4200 (met $0)      Life Max: N/A CIR: $250 days 1-6 max $1500 per admission      SNF: $0 days 1-20; $164 days 21-100 Outpatient: medical necessity     Co-Pay: $40/visit Home Health: 100%      Co-Pay: none DME: 80%     Co-Pay: 20% Providers: in network  Medicaid Application Date:        Case Manager:   Disability Application Date:        Case Worker:    Emergency Contact Information Contact Information    Name Relation Home Work Mobile   Johnson,[name] Mother [phone]  [phone]      Current Medical History  Patient Admitting Diagnosis:  Multiple acute brain infarcts  History of Present Illness: a 32 y.o. male, With history of hypertension, hyperlipidemia, diabetes mellitus came to hospital with complaints of right-sided numbness for past 2 days.  Patient says that he had some weakness of right arm and leg.  Denies any headache.  No facial droop.  No slurred speech.  No previous history of stroke.  CT scan of the head reviewed, showing bilateral infarcts.  Per report, new left internal capsule white matter changes seen with chronic small vessel ischemic disease though given right-sided numbness this may be acute. Patient also found to be in DKA, blood glucose elevated to 840 with anion gap of 19 patient started on IV insulin DKA  protocol.  MRI showed acute lacunar infarct of the left lateral thalamus/internal capsule corresponding to the CT finding yesterday. No associated hemorrhage or mass effect. 2. Additional small acute lacunar infarcts in the anterior left frontal lobe subcortical white matter (Left MCA territory). Subacute infarct of the Right superior cerebellum (Right SCA territory). No hemorrhage or mass effect.  PT/OT/SLP evaluations completed with recommendations for acute inpatient rehab admission.  [facility] has been reviewed by the rehabilitation admission coordinator and physician.  NIH Stroke scale: 4  Past Medical History  Past Medical History:  Diagnosis Date  . Diabetes mellitus    lantus/novolog  . Hyperlipidemia   . Hypertension   . Marijuana abuse    occaisionally  . Noncompliance with medication regimen   . Tobacco abuse    5/day    Family History   family history includes Diabetes in his father; Hypertension in his mother; Kidney failure in his father.  Prior Rehab/Hospitalizations Has the patient had major surgery during 100 days prior to admission? No    Current Medications Please see MAR from [facility]  Patients Current Diet:  Heart Healthy, carb modified, thin liquids  Precautions / Restrictions Precautions Precautions: Fall Precaution Comments: Pt fell out of bed in hospital room. Multiple falls past 2 days Restrictions Weight Bearing Restrictions: No   Has the patient had 2 or more falls or a fall with injury in the past year?No.  Mom reports 1 fall at the time of the strokes  Prior Activity Level Community (5-7x/wk): Went out daily and was driving up until 2 days PTA.  Prior Functional Level Self Care: Did the patient need help bathing, dressing, using the toilet or eating? Independent  Indoor Mobility: Did the patient need assistance with walking from room to room (with or without device)?  Independent  Stairs: Did the patient need assistance with internal or external stairs (with or without device)? Independent  Functional Cognition: Did the patient need help planning regular tasks such as shopping or remembering to take medications? Independent  Home Assistive Devices / Equipment Home Assistive Devices/Equipment: None Home Equipment: None  Prior Device Use: Indicate devices/aids used by the patient prior to current illness, exacerbation or injury? None   Prior Functional Level Current Functional Level  Bed Mobility  Independent  Other(Supervision)   Transfers  Independent  Min assist   Mobility - Walk/Wheelchair  Independent  Min assist(Ambulated 45 feet RW with min assist.)   Upper Body Dressing  Independent  Min assist(min guard assist.)   Lower Body Dressing  Independent  Mod assist   Grooming  Independent  Other(Set up)   Eating/Drinking  Independent  Other(supervision)   Toilet Transfer  Independent  Min assist   Bladder Continence   WDL  Voiding WDL   Bowel Management  WDL  Last BM 02/19/18   Stair Climbing  Independent Other(Not tried)   Counsellor  Verbal   Memory  WDL  WDL   Cooking/Meal Prep  Independent      Housework  Independent    Money Management  Independent    Driving  Yes, driving independently     Special needs/care consideration BiPAP/CPAP No CPM No Continuous Drip IV 0.9% NS 100 mL/hr Dialysis No        Life Vest No Oxygen No Special Bed No Trach Size No Wound Vac (area) No     Skin No                              Bowel mgmt: Last BM 02/19/18 Bladder mgmt: Voiding WDL Diabetic mgmt Yes, on insulin at home  Previous Home Environment Living Arrangements: Parent, Other relatives  Lives With: Family Available Help at Discharge: Family, Available 24 hours/day Type of Home: Mobile home Home Layout: One level Home Access: Stairs to enter Entrance Stairs-Rails: Right Entrance  Stairs-Number of Steps: 2 Bathroom Accessibility: Yes [place]: No  Discharge Living Setting Plans for Discharge Living Setting: Lives with (comment), Mobile Home(Lives with mom and 44 yo autistic sister.) Type of Home at Discharge: Mobile home(Double wide) Discharge Home Layout: One level Discharge Home Access: Stairs to enter Entrance Stairs-Number of Steps: 3-4 steps at the front entry Does the patient have any problems obtaining your medications?: No  Social/Family/Support Systems Patient Roles: Other (Comment)(Has a mom, autistic sister) Contact Information: Deetta Perla - mother Anticipated Caregiver: mother Anticipated Caregiver's Contact Information: [place] - mom - [phone] Ability/Limitations of Caregiver: Mom does not work.  Mom cares for her mildly autistic daughter at home. Caregiver Availability: 24/7 Discharge Plan Discussed with  Primary Caregiver: Yes Is Caregiver In Agreement with Plan?: Yes Does Caregiver/Family have Issues with Lodging/Transportation while Pt is in Rehab?: No  Goals/Additional Needs Patient/Family Goal for Rehab: PT/OT mod I and supervision goals Expected length of stay: 10-14 days Cultural Considerations: Member [employer] [place] Needs: Heart healthy, carb mod, thin liquids Equipment Needs: TBD Additional Information: Patient is disabled from diabetes. Pt/Family Agrees to Admission and willing to participate: Yes Program Orientation Provided & Reviewed with Pt/Caregiver Including Roles  & Responsibilities: Yes  Patient Condition: I have reviewed all clinicals.  I have spoken with patient and with his mother.  Patient will benefit and can tolerate 3 hours of therapy a day.  He needs the coordinated approach of the rehab team to regain functional independence prior to home with his mother.  I have shared all information with rehab MD and have approval for acute inpatient rehab admission.  Preadmission Screen  Completed By:  Retta Diones, with brief updates by Gunnar Fusi 02/22/2018 9:11 AM ______________________________________________________________________   Discussed status with Dr. Posey Pronto on 02/22/18 at 0911 and received telephone approval for admission today.  Admission Coordinator:  Gunnar Fusi, time 0911/Date 02/22/18   Assessment/Plan: Diagnosis: Acute bilateral brain infarcts 1. Does the need for close, 24 hr/day  Medical supervision in concert with the patient's rehab needs make it unreasonable for this patient to be served in a less intensive setting? Yes 2. Co-Morbidities requiring supervision/potential complications: tobacco abuse, noncompliance, marijuana abuse, HTN, HLD, DM 3. Due to safety, disease management and patient education, does the patient require 24 hr/day rehab nursing? Yes 4. Does the patient require coordinated care of a physician, rehab nurse, PT (1-2 hrs/day, 5 days/week) and OT (1-2 hrs/day, 5 days/week) to address physical and functional deficits in the context of the above medical diagnosis(es)? Yes Addressing deficits in the following areas: balance, endurance, locomotion, strength, transferring, bathing, dressing, toileting and psychosocial support 5. Can the patient actively participate in an intensive therapy program of at least 3 hrs of therapy 5 days a week? Yes 6. The potential for patient to make measurable gains while on inpatient rehab is excellent 7. Anticipated functional outcomes upon discharge from inpatients are: supervision and min assist PT, supervision and min assist OT, n/a SLP 8. Estimated rehab length of stay to reach the above functional goals is: 13-17 days. 9. Does the patient have adequate social supports to accommodate these discharge functional goals? Yes 10. Anticipated D/C setting: Home 11. Anticipated post D/C treatments: HH therapy and Home excercise program 12. Overall Rehab/Functional Prognosis: good    RECOMMENDATIONS: This  patient's condition is appropriate for continued rehabilitative care in the following setting: CIR Patient has agreed to participate in recommended program. Yes Note that insurance prior authorization may be required for reimbursement for recommended care.   Delice Lesch, MD, ABPMR 02/22/18 Gunnar Fusi 02/22/2018

## 2018-02-21 NOTE — Care Management Note (Signed)
Case Management Note  Patient Details  Name: Nathaniel [name] MRN: [id number] Date of Birth: [birth year]  If discussed at [place] Length of Stay Meetings, dates discussed:  02/21/2018  Additional Comments:  Kattleya Kuhnert, Chauncey [place], RN 02/21/2018, 12:07 PM

## 2018-02-21 NOTE — Progress Notes (Signed)
Rehab admissions - [facility]  I have approval for acute inpatient rehab admission.  I have spoken with patient and with his mother.  Will plan for inpatient rehab admission at [facility] for tomorrow.  Call me for questions.  [phone]

## 2018-02-21 NOTE — Progress Notes (Signed)
Reviewed neurology note and agree that tight control of lipids blood pressure and glycemic control is the key to preventing further chief urinary future episodes currently starting speech therapy along with physical therapy aspirin change to 162 mg daily as per neurology appears hemodynamically stable at present I had a long talk with the patient about being vigilant for control of his medical issues and not have a laissez-faire attitude he seems to understand me look forward to transferring him to CIR for bed is available as of Wednesday. Nathaniel [name] ZCH:[id number] DOB: 1986/06/20 DOA: 02/16/2018 PCP: Nathaniel Gaskins, MD   Physical Exam: Blood pressure (!) 155/103, pulse 87, temperature 98.3 F (36.8 C), temperature source Oral, resp. rate 16, height 6\' 2"  (1.88 m), weight 72.6 kg (160 lb), SpO2 100 %.  Lungs clear to A&P no rales wheeze rhonchi heart regular rhythm no S3-S4 no heaves thrills or rubs neurologic exam unchanged  Investigations:  No results found for this or any previous visit (from the past 240 hour(s)).   Basic Metabolic Panel: Recent Labs    02/19/18 0559 02/20/18 0629  NA 137 141  K 3.7 3.8  CL 106 109  CO2 24 26  GLUCOSE 197* 44*  BUN 11 15  CREATININE 1.34* 1.51*  CALCIUM 8.8* 9.2   Liver Function Tests: Recent Labs    02/19/18 1201  AST 23  ALT 16*  ALKPHOS 76  BILITOT 0.7  PROT 7.0  ALBUMIN 3.1*     CBC: No results for input(s): WBC, NEUTROABS, HGB, HCT, MCV, PLT in the last 72 hours.  Mr Nathaniel [name] OI Contrast  Result Date: 02/20/2018 CLINICAL DATA:  Acute right cerebellar, left thalamic, and left frontal lobe infarcts. Abnormal MRA complicated by motion artifact. Follow-up MRA. EXAM: MRA HEAD WITHOUT CONTRAST TECHNIQUE: Angiographic images of the Circle of Willis were obtained using MRA technique without intravenous contrast. COMPARISON:  MRI and MRA head 02/17/2018 FINDINGS: Atherosclerotic irregularity is present at the cavernous and  precavernous internal carotid arteries bilaterally. There is less patient motion on today's study. No significant stenosis is present relative to the more distal vessels. The A1 and M1 segments are normal. No definite anterior communicating artery is present. MCA bifurcations are intact. ACA and MCA branch vessels are within normal limits. The left vertebral artery is the dominant vessel. PICA origin is visualized and normal. Right AICA is dominant. The basilar artery is normal. Both posterior cerebral arteries originate from basilar tip. There is some attenuation of distal PCA branch vessels bilaterally without a significant proximal stenosis or occlusion. No aneurysms are present. IMPRESSION: 1. Atherosclerotic changes within the cavernous and precavernous internal carotid arteries bilaterally, left greater than right, without significant stenosis relative to the more distal vessels. 2. Mild distal small vessel disease most evident and posterior circulation without other significant proximal stenosis, aneurysm, or branch vessel occlusion. Electronically Signed   By: San Morelle M.D.   On: 02/20/2018 09:11      Medications:   Impression:  Active Problems:   Essential hypertension, benign   Type 1 diabetes mellitus with complication (HCC)   DKA (diabetic ketoacidoses) (HCC)   CVA (cerebral vascular accident) ([place])     Plan: Change aspirin 162 mg tight control of diabetes lipids and blood pressure.  Physical therapy speech therapy.  Transfer to CIR starting Wednesday  Consultants: Neurology   Procedures  Antibiotics:      Time spent: 30 minutes   LOS: 5 days   Nathaniel [name] M   02/21/2018, 7:08  AM

## 2018-02-21 NOTE — Progress Notes (Addendum)
Inpatient Diabetes Program Recommendations  AACE/ADA: New Consensus Statement on Inpatient Glycemic Control (2019)  Target Ranges:  Prepandial:   less than 140 mg/dL      Peak postprandial:   less than 180 mg/dL (1-2 hours)      Critically ill patients:  140 - 180 mg/dL  Results for Nathaniel [name], Nathaniel [name] (MRN [id number]) as of 02/21/2018 15:12  Ref. Range 02/21/2018 07:47 02/21/2018 08:54 02/21/2018 08:55 02/21/2018 11:07  Glucose-Capillary Latest Ref Range: 65 - 99 mg/dL 31 (LL) 168 (H) 170 (H) 305 (H)   Results for Nathaniel [name], Nathaniel [name] (MRN [id number]) as of 02/21/2018 07:17  Ref. Range 02/20/2018 07:43 02/20/2018 08:07 02/20/2018 09:17 02/20/2018 11:35 02/20/2018 17:08 02/20/2018 20:36  Glucose-Capillary Latest Ref Range: 65 - 99 mg/dL 54 (L) 92 118 (H) 228 (H)  Novolog 7 units 344 (H)  Novolog 7 units  Levemir 25 units 312 (H)    Diabetes history: DM1 (makes no insulin; requires basal, correction, and meal coverage insulin) Outpatient Diabetes medications: Levemir 50 units BID Current orders for Inpatient glycemic control: Levemir 25 units daily, Novolog 0-9 units TID with meals  Inpatient Diabetes Program Recommendations:  Basal: Fasting glucose 31 mg/dl today. Please consider decreasing Levemir to 21 units daily. Correction (SSI): Please consider reordering Novolog 0-5 units QHS for bedtime correction scale. Insulin - Meal Coverage: Patient has not received any meal coverage for carbohydrates consumed. Patient will require insulin to cover carbohydrates. Please consider ordering Novolog 6 units TID with meals for meal coverage if patient eats at least 50% of meals. Diet: Please discontinue Regular diet and order Carb Modified diet. Outpatient Referral:Recommend patient establish care with Endocrinologist to improve DM control.  Thanks, Barnie Alderman, RN, MSN, CDE Diabetes Coordinator Inpatient Diabetes Program [phone] (Team Pager from 8am to 5pm)

## 2018-02-22 ENCOUNTER — Inpatient Hospital Stay (HOSPITAL_COMMUNITY)
Admission: RE | Admit: 2018-02-22 | Discharge: 2018-03-08 | DRG: 056 | Disposition: A | Discharge status: Home Health | Payer: Medicare HMO | Source: Other Acute Inpatient Hospital | Attending: Physical Medicine & Rehabilitation | Admitting: Physical Medicine & Rehabilitation

## 2018-02-22 ENCOUNTER — Encounter (HOSPITAL_COMMUNITY): Payer: Self-pay | Admitting: *Deleted

## 2018-02-22 ENCOUNTER — Other Ambulatory Visit: Payer: Self-pay

## 2018-02-22 DIAGNOSIS — A530 Latent syphilis, unspecified as early or late: Secondary | ICD-10-CM | POA: Diagnosis not present

## 2018-02-22 DIAGNOSIS — A539 Syphilis, unspecified: Secondary | ICD-10-CM

## 2018-02-22 DIAGNOSIS — E1065 Type 1 diabetes mellitus with hyperglycemia: Secondary | ICD-10-CM | POA: Diagnosis not present

## 2018-02-22 DIAGNOSIS — D72829 Elevated white blood cell count, unspecified: Secondary | ICD-10-CM | POA: Diagnosis present

## 2018-02-22 DIAGNOSIS — I69351 Hemiplegia and hemiparesis following cerebral infarction affecting right dominant side: Secondary | ICD-10-CM | POA: Diagnosis present

## 2018-02-22 DIAGNOSIS — Z716 Tobacco abuse counseling: Secondary | ICD-10-CM | POA: Diagnosis not present

## 2018-02-22 DIAGNOSIS — E782 Mixed hyperlipidemia: Secondary | ICD-10-CM

## 2018-02-22 DIAGNOSIS — I160 Hypertensive urgency: Secondary | ICD-10-CM | POA: Diagnosis present

## 2018-02-22 DIAGNOSIS — E1051 Type 1 diabetes mellitus with diabetic peripheral angiopathy without gangrene: Secondary | ICD-10-CM | POA: Diagnosis present

## 2018-02-22 DIAGNOSIS — Z79899 Other long term (current) drug therapy: Secondary | ICD-10-CM

## 2018-02-22 DIAGNOSIS — Z9114 Patient's other noncompliance with medication regimen: Secondary | ICD-10-CM | POA: Diagnosis not present

## 2018-02-22 DIAGNOSIS — Z833 Family history of diabetes mellitus: Secondary | ICD-10-CM

## 2018-02-22 DIAGNOSIS — I69398 Other sequelae of cerebral infarction: Secondary | ICD-10-CM

## 2018-02-22 DIAGNOSIS — I69393 Ataxia following cerebral infarction: Secondary | ICD-10-CM | POA: Diagnosis not present

## 2018-02-22 DIAGNOSIS — Z91018 Allergy to other foods: Secondary | ICD-10-CM

## 2018-02-22 DIAGNOSIS — I129 Hypertensive chronic kidney disease with stage 1 through stage 4 chronic kidney disease, or unspecified chronic kidney disease: Secondary | ICD-10-CM | POA: Diagnosis present

## 2018-02-22 DIAGNOSIS — Z9119 Patient's noncompliance with other medical treatment and regimen: Secondary | ICD-10-CM

## 2018-02-22 DIAGNOSIS — F17210 Nicotine dependence, cigarettes, uncomplicated: Secondary | ICD-10-CM | POA: Diagnosis present

## 2018-02-22 DIAGNOSIS — N183 Chronic kidney disease, stage 3 unspecified: Secondary | ICD-10-CM

## 2018-02-22 DIAGNOSIS — E1022 Type 1 diabetes mellitus with diabetic chronic kidney disease: Secondary | ICD-10-CM | POA: Diagnosis present

## 2018-02-22 DIAGNOSIS — Z794 Long term (current) use of insulin: Secondary | ICD-10-CM | POA: Diagnosis not present

## 2018-02-22 DIAGNOSIS — I10 Essential (primary) hypertension: Secondary | ICD-10-CM | POA: Diagnosis not present

## 2018-02-22 DIAGNOSIS — R269 Unspecified abnormalities of gait and mobility: Secondary | ICD-10-CM

## 2018-02-22 DIAGNOSIS — E1010 Type 1 diabetes mellitus with ketoacidosis without coma: Secondary | ICD-10-CM | POA: Diagnosis present

## 2018-02-22 DIAGNOSIS — E785 Hyperlipidemia, unspecified: Secondary | ICD-10-CM

## 2018-02-22 DIAGNOSIS — E162 Hypoglycemia, unspecified: Secondary | ICD-10-CM

## 2018-02-22 DIAGNOSIS — I63512 Cerebral infarction due to unspecified occlusion or stenosis of left middle cerebral artery: Secondary | ICD-10-CM | POA: Diagnosis present

## 2018-02-22 DIAGNOSIS — A528 Late syphilis, latent: Secondary | ICD-10-CM | POA: Diagnosis present

## 2018-02-22 DIAGNOSIS — I6340 Cerebral infarction due to embolism of unspecified cerebral artery: Secondary | ICD-10-CM | POA: Diagnosis not present

## 2018-02-22 DIAGNOSIS — N179 Acute kidney failure, unspecified: Secondary | ICD-10-CM | POA: Diagnosis present

## 2018-02-22 DIAGNOSIS — Z841 Family history of disorders of kidney and ureter: Secondary | ICD-10-CM | POA: Diagnosis not present

## 2018-02-22 DIAGNOSIS — R739 Hyperglycemia, unspecified: Secondary | ICD-10-CM | POA: Diagnosis not present

## 2018-02-22 DIAGNOSIS — F17200 Nicotine dependence, unspecified, uncomplicated: Secondary | ICD-10-CM

## 2018-02-22 DIAGNOSIS — E10649 Type 1 diabetes mellitus with hypoglycemia without coma: Secondary | ICD-10-CM | POA: Diagnosis not present

## 2018-02-22 DIAGNOSIS — Z8249 Family history of ischemic heart disease and other diseases of the circulatory system: Secondary | ICD-10-CM | POA: Diagnosis not present

## 2018-02-22 DIAGNOSIS — A523 Neurosyphilis, unspecified: Secondary | ICD-10-CM | POA: Diagnosis not present

## 2018-02-22 DIAGNOSIS — Z8673 Personal history of transient ischemic attack (TIA), and cerebral infarction without residual deficits: Secondary | ICD-10-CM | POA: Diagnosis not present

## 2018-02-22 DIAGNOSIS — I63013 Cerebral infarction due to thrombosis of bilateral vertebral arteries: Secondary | ICD-10-CM

## 2018-02-22 DIAGNOSIS — F1210 Cannabis abuse, uncomplicated: Secondary | ICD-10-CM | POA: Diagnosis present

## 2018-02-22 DIAGNOSIS — E1122 Type 2 diabetes mellitus with diabetic chronic kidney disease: Secondary | ICD-10-CM

## 2018-02-22 DIAGNOSIS — E119 Type 2 diabetes mellitus without complications: Secondary | ICD-10-CM | POA: Diagnosis not present

## 2018-02-22 LAB — GLUCOSE, CAPILLARY
GLUCOSE-CAPILLARY: 41 mg/dL — AB (ref 65–99)
GLUCOSE-CAPILLARY: 110 mg/dL — AB (ref 65–99)
GLUCOSE-CAPILLARY: 92 mg/dL (ref 65–99)
Glucose-Capillary: 380 mg/dL — ABNORMAL HIGH (ref 65–99)
Glucose-Capillary: 133 mg/dL — ABNORMAL HIGH (ref 65–99)
Glucose-Capillary: 58 mg/dL — ABNORMAL LOW (ref 65–99)

## 2018-02-22 MED ORDER — LISINOPRIL 20 MG PO TABS
20.0000 mg | ORAL_TABLET | Freq: Every day | ORAL | 5 refills | Status: DC
Start: 2018-02-23 — End: 2018-03-08

## 2018-02-22 MED ORDER — INSULIN ASPART 100 UNIT/ML ~~LOC~~ SOLN
0.0000 [IU] | Freq: Three times a day (TID) | SUBCUTANEOUS | Status: DC
  Administered 2018-02-22: 1 [IU] via SUBCUTANEOUS
  Administered 2018-02-23: 3 [IU] via SUBCUTANEOUS
  Administered 2018-02-23 – 2018-02-24 (×2): 9 [IU] via SUBCUTANEOUS

## 2018-02-22 MED ORDER — ENOXAPARIN SODIUM 40 MG/0.4ML ~~LOC~~ SOLN
40.0000 mg | SUBCUTANEOUS | Status: DC
  Administered 2018-02-22: 40 mg via SUBCUTANEOUS
  Filled 2018-02-22: qty 0.4

## 2018-02-22 MED ORDER — LISINOPRIL 20 MG PO TABS
20.0000 mg | ORAL_TABLET | Freq: Every day | ORAL | Status: DC
  Administered 2018-02-23 – 2018-03-06 (×12): 20 mg via ORAL
  Filled 2018-02-22 (×12): qty 1

## 2018-02-22 MED ORDER — ATORVASTATIN CALCIUM 20 MG PO TABS: 20.0000 mg | ORAL_TABLET | Freq: Every day | ORAL | 6 refills | Status: DC

## 2018-02-22 MED ORDER — DIPHENHYDRAMINE HCL 12.5 MG/5ML PO ELIX: 12.5000 mg | ORAL_SOLUTION | Freq: Four times a day (QID) | ORAL | Status: DC | PRN

## 2018-02-22 MED ORDER — INSULIN ASPART 100 UNIT/ML ~~LOC~~ SOLN
0.0000 [IU] | Freq: Every day | SUBCUTANEOUS | Status: DC
  Administered 2018-02-24: 4 [IU] via SUBCUTANEOUS
  Administered 2018-02-27: 5 [IU] via SUBCUTANEOUS
  Administered 2018-03-01: 4 [IU] via SUBCUTANEOUS
  Administered 2018-03-03: 3 [IU] via SUBCUTANEOUS
  Administered 2018-03-04: 4 [IU] via SUBCUTANEOUS
  Administered 2018-03-05: 3 [IU] via SUBCUTANEOUS
  Administered 2018-03-07: 5 [IU] via SUBCUTANEOUS

## 2018-02-22 MED ORDER — PROCHLORPERAZINE 25 MG RE SUPP: 12.5000 mg | Freq: Four times a day (QID) | RECTAL | Status: DC | PRN

## 2018-02-22 MED ORDER — ENOXAPARIN SODIUM 40 MG/0.4ML ~~LOC~~ SOLN: 40.0000 mg | SUBCUTANEOUS | Status: DC

## 2018-02-22 MED ORDER — TRAZODONE HCL 50 MG PO TABS
25.0000 mg | ORAL_TABLET | Freq: Every evening | ORAL | Status: DC | PRN
  Administered 2018-03-01 – 2018-03-04 (×2): 50 mg via ORAL
  Filled 2018-02-22 (×2): qty 1

## 2018-02-22 MED ORDER — ASPIRIN 300 MG RE SUPP: 300.0000 mg | Freq: Every day | RECTAL | Status: DC

## 2018-02-22 MED ORDER — POLYETHYLENE GLYCOL 3350 17 G PO PACK
17.0000 g | PACK | Freq: Every day | ORAL | Status: DC | PRN
  Administered 2018-03-04: 17 g via ORAL
  Filled 2018-02-22: qty 1

## 2018-02-22 MED ORDER — PROCHLORPERAZINE EDISYLATE 10 MG/2ML IJ SOLN: 5.0000 mg | Freq: Four times a day (QID) | INTRAMUSCULAR | Status: DC | PRN

## 2018-02-22 MED ORDER — SENNOSIDES-DOCUSATE SODIUM 8.6-50 MG PO TABS: 1.0000 | ORAL_TABLET | Freq: Every evening | ORAL | Status: DC | PRN

## 2018-02-22 MED ORDER — ATORVASTATIN CALCIUM 20 MG PO TABS
20.0000 mg | ORAL_TABLET | Freq: Every day | ORAL | Status: DC
  Administered 2018-02-22 – 2018-02-23 (×2): 20 mg via ORAL
  Filled 2018-02-22 (×2): qty 1

## 2018-02-22 MED ORDER — INSULIN ASPART 100 UNIT/ML ~~LOC~~ SOLN: 6.0000 [IU] | Freq: Three times a day (TID) | SUBCUTANEOUS | 11 refills | Status: DC

## 2018-02-22 MED ORDER — ACETAMINOPHEN 325 MG PO TABS
325.0000 mg | ORAL_TABLET | ORAL | Status: DC | PRN
  Administered 2018-03-01 – 2018-03-08 (×7): 650 mg via ORAL
  Filled 2018-02-22 (×7): qty 2

## 2018-02-22 MED ORDER — INSULIN ASPART 100 UNIT/ML ~~LOC~~ SOLN
6.0000 [IU] | Freq: Three times a day (TID) | SUBCUTANEOUS | Status: DC
  Administered 2018-02-22: 6 [IU] via SUBCUTANEOUS

## 2018-02-22 MED ORDER — ASPIRIN 81 MG PO TABS: 162.0000 mg | ORAL_TABLET | Freq: Every day | ORAL | 12 refills | Status: DC

## 2018-02-22 MED ORDER — ALUM & MAG HYDROXIDE-SIMETH 200-200-20 MG/5ML PO SUSP
30.0000 mL | ORAL | Status: DC | PRN
  Administered 2018-03-04 – 2018-03-07 (×2): 30 mL via ORAL
  Filled 2018-02-22 (×3): qty 30

## 2018-02-22 MED ORDER — FLEET ENEMA 7-19 GM/118ML RE ENEM: 1.0000 | ENEMA | Freq: Once | RECTAL | Status: DC | PRN

## 2018-02-22 MED ORDER — PROCHLORPERAZINE MALEATE 5 MG PO TABS: 5.0000 mg | ORAL_TABLET | Freq: Four times a day (QID) | ORAL | Status: DC | PRN

## 2018-02-22 MED ORDER — BISACODYL 10 MG RE SUPP: 10.0000 mg | Freq: Every day | RECTAL | Status: DC | PRN

## 2018-02-22 MED ORDER — INSULIN DETEMIR 100 UNIT/ML ~~LOC~~ SOLN
16.0000 [IU] | Freq: Every day | SUBCUTANEOUS | Status: DC
  Administered 2018-02-22: 16 [IU] via SUBCUTANEOUS
  Filled 2018-02-22: qty 0.16

## 2018-02-22 MED ORDER — INSULIN DETEMIR 100 UNIT/ML ~~LOC~~ SOLN: 21.0000 [IU] | Freq: Every day | SUBCUTANEOUS | 11 refills | Status: DC

## 2018-02-22 MED ORDER — CLONIDINE HCL 0.1 MG PO TABS
0.1000 mg | ORAL_TABLET | Freq: Four times a day (QID) | ORAL | Status: DC | PRN
  Administered 2018-02-26: 0.1 mg via ORAL
  Filled 2018-02-22: qty 1

## 2018-02-22 MED ORDER — GUAIFENESIN-DM 100-10 MG/5ML PO SYRP: 5.0000 mL | ORAL_SOLUTION | Freq: Four times a day (QID) | ORAL | Status: DC | PRN

## 2018-02-22 MED ORDER — ASPIRIN 325 MG PO TABS
162.0000 mg | ORAL_TABLET | Freq: Every day | ORAL | Status: DC
  Administered 2018-02-23: 162 mg via ORAL
  Filled 2018-02-22: qty 1

## 2018-02-22 NOTE — Progress Notes (Signed)
Patient information reviewed and entered into eRehab system by Tanvi Gatling, RN, CRRN, PPS Coordinator.  Information including medical coding and functional independence measure will be reviewed and updated through discharge.     Per nursing patient was given "Data Collection Information Summary for Patients in Inpatient Rehabilitation Facilities with attached "Privacy Act Statement-Health Care Records" upon admission.  

## 2018-02-22 NOTE — Progress Notes (Signed)
Pt awaiting transfer to [facility]. Report given to [facility].

## 2018-02-22 NOTE — Progress Notes (Signed)
Pt arrived to unit via [facility]. Pt in no current distress or pain. Pt's blood pressure is under control. Admission information given to patient. Continue plan of care.

## 2018-02-22 NOTE — Progress Notes (Signed)
Inpatient Rehabilitation  I am following for an IP Rehab admission today.  Patient will admit to [facility] with Dr. Delice Lesch as the admitting MD.  I have spoken to team this morning and await patient's discharge prior to arranging [facility] transport.  Call if questions.   Carmelia Roller., CCC/SLP Admission Coordinator  [facility]  Cell [phone]

## 2018-02-22 NOTE — H&P (Signed)
Physical Medicine and Rehabilitation Admission H&P    Chief Complaint  Patient presents with  . Functional deficits  . Due to stroke    HPI: Nathaniel [name] is a 32 year old male with history of T1DM poorly controlled with multiple admissions for DKA, HTN, non-compliance who was admitted on 02/17/2108 with progressive right sided numbness and weakness X  2 days. He was found to have DKA with BS 840, anion gap 19 and acute on chronic renal failure with BUN/SCr/K - 32/2.20/5.5.  History taken from chart review. He was started on DKA protocol and fluid for hydration. CT head reviewed, showing left internal capsule infarct. MRI brain done revealing acute lacunar infarct of left lateral thalamus/internal capsule, additional small acute lacunar infarcts in subcortical  left frontal lobe, subacute infarct in right superior cerebellum and multiple chronic lacunae in cerebellum and pons. MRA brain showed atherosclerotic changes without significant stenosis. Carotid dopplers without significant ICA stenosis. 2 D echo showed EF 55- 60% with no wall abnormality.    HIV NR. 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 Diabetic coordinator following of input. He continues to be limited by right sided weakness and sensory deficits. CIR recommended due to functional decline.    Review of Systems  Constitutional: Negative for chills, fever and malaise/fatigue.  HENT: Negative for hearing loss and tinnitus.   Eyes: Negative for blurred vision and double vision.  Respiratory: Negative for cough and shortness of breath.   Cardiovascular: Negative for chest pain, palpitations and leg swelling.  Gastrointestinal: Negative for heartburn.  Genitourinary: Negative for dysuria, flank pain, frequency and urgency.  Musculoskeletal: Negative for back pain, myalgias and neck pain.  Skin: Negative for rash.  Neurological: Positive for  sensory change and weakness. Negative for dizziness and headaches.  Psychiatric/Behavioral: The patient is not nervous/anxious and does not have insomnia.   All other systems reviewed and are negative.     Past Medical History:  Diagnosis Date  . Diabetes mellitus    lantus/novolog  . Hyperlipidemia   . Hypertension   . Marijuana abuse    occaisionally  . Noncompliance with medication regimen   . Tobacco abuse    5/day    Past Surgical History:  Procedure Laterality Date  . EYE SURGERY      Family History  Problem Relation Age of Onset  . Diabetes Father   . Kidney failure Father        last 4 mnths of life  . Hypertension Mother     Social History:  reports that he has been smoking cigarettes.  He has a 2.00 pack-year smoking history. He has never used smokeless tobacco. He reports that he has current or past drug history. Drug: Marijuana. He reports that he does not drink alcohol.    Allergies  Allergen Reactions  . Orange     Increases blood sugar immediately     Medications Prior to Admission  Medication Sig Dispense Refill  . amLODipine (NORVASC) 5 MG tablet Take 1 tablet (5 mg total) by mouth daily. 30 tablet 3  . LEVEMIR FLEXTOUCH 100 UNIT/ML Pen INJECT 30 UNITS SUBCUTNEOUSLY A.C. Bid , Breakfast and supper. (Patient taking differently: Inject 30 Units into the skin 2 (two) times daily. INJECT [place]. Bid , Breakfast and supper.) 15 mL 11  . lisinopril (PRINIVIL,ZESTRIL) 10 MG tablet Take 1 tablet (10 mg total) by  mouth daily. 30 tablet 1  . naproxen (NAPROSYN) 500 MG tablet Take 500 mg by mouth 2 (two) times daily.  6    Drug Regimen Review  Drug regimen was reviewed and remains appropriate with no significant issues identified  Home: Home Living Family/patient expects to be discharged to:: Private residence Living Arrangements: Parent, Other relatives Available Help at Discharge: Family, Available 24 hours/day Type of Home: Mobile  home Home Access: Stairs to enter Entrance Stairs-Number of Steps: 2 Entrance Stairs-Rails: Right Home Layout: One level Bathroom Accessibility: Yes Home Equipment: None  Lives With: Family   Functional History: Prior Function Level of Independence: Independent Gait / Transfers Assistance Needed: was independent until unable to walk for past 2 days ADL's / Homemaking Assistance Needed: Pt reports independence prior to past 2 days. For past 2 days mother has been assisting with dressing and toileting, pt unable to get up to perform ADLs  Functional Status:  Mobility: Bed Mobility Overal bed mobility: Needs Assistance Bed Mobility: Supine to Sit Supine to sit: Supervision Sit to supine: Supervision, HOB elevated General bed mobility comments: with bed flat Transfers Overall transfer level: Needs assistance Equipment used: Rolling walker (2 wheeled) Transfers: Sit to/from Stand, Stand Pivot Transfers Sit to Stand: Min assist Stand pivot transfers: Min assist General transfer comment: increased time, cuing for sequencing Ambulation/Gait Ambulation/Gait assistance: Min assist Ambulation Distance (Feet): 45 Feet Assistive device: Rolling walker (2 wheeled) Gait Pattern/deviations: Decreased step length - right, Decreased stance time - right, Decreased stride length, Ataxic General Gait Details: demonstrates ataxic like slow labored cadence with improvement for right heel to toe stepping, verbal cues to step closer to RW, increased endurance/distance and able to keep trunk upright Gait velocity: slow    ADL: ADL Overall ADL's : Needs assistance/impaired Eating/Feeding: Supervision/ safety, Sitting Eating/Feeding Details (indicate cue type and reason): Pt able to open package of graham crackers using both hands. Held and drank from cup with right hand Grooming: Wash/dry hands, Set up, Sitting Grooming Details (indicate cue type and reason): Pt unable to stand for grooming  tasks Upper Body Dressing : Min guard, Sitting Upper Body Dressing Details (indicate cue type and reason): Increased time due to right coordination deficits Lower Body Dressing: Moderate assistance, Sitting/lateral leans Lower Body Dressing Details (indicate cue type and reason): Pt able to donn left sock using bilateral hands, right hand occasionally slipping off sock. Pt unable to donn right sock due to inability to bring right foot to left knee and unable to bend down due to balance deficits. OT assisted with right sock General ADL Comments: Did not attempt standing tasks due to pt's low BG  Cognition: Cognition Overall Cognitive Status: Within Functional Limits for tasks assessed Arousal/Alertness: Awake/alert Orientation Level: Oriented X4 Memory: Appears intact Awareness: Appears intact Problem Solving: Appears intact Safety/Judgment: Appears intact Cognition Arousal/Alertness: Awake/alert Behavior During Therapy: WFL for tasks assessed/performed Overall Cognitive Status: Within Functional Limits for tasks assessed   Blood pressure (!) 173/109, pulse 92, temperature 98.8 F (37.1 C), temperature source Oral, resp. rate 12, height 6' 2" (1.88 m), weight 72.6 kg (160 lb), SpO2 100 %. Physical Exam  Nursing note and vitals reviewed. Constitutional: He is oriented to person, place, and time. He appears well-developed.  frail  HENT:  Head: Normocephalic and atraumatic.  Eyes: EOM are normal. Right eye exhibits no discharge. Left eye exhibits no discharge.  Neck: Normal range of motion. Neck supple.  Cardiovascular: Normal rate and regular rhythm.  Respiratory: Effort normal and  breath sounds normal.  GI: Soft. Bowel sounds are normal.  Musculoskeletal:  No edema or tenderness in extremities  Neurological: He is alert and oriented to person, place, and time.  Motor: Right upper extremity/right lower extremity: 4+/5 proximal to distal Left upper shoulder recess left lower  extremity: 5/5 proximal to distal Sensation diminished to light touch right upper extremity/right lower extremity subjectively  Skin: Skin is warm and dry.  Psychiatric: He has a normal mood and affect. His behavior is normal.    Results for orders placed or performed during the hospital encounter of 02/16/18 (from the past 48 hour(s))  Glucose, capillary     Status: Abnormal   Collection Time: 02/19/18  4:30 PM  Result Value Ref Range   Glucose-Capillary 235 (H) 65 - 99 mg/dL   Comment 1 Notify RN    Comment 2 Document in Chart   Glucose, capillary     Status: Abnormal   Collection Time: 02/19/18 10:50 PM  Result Value Ref Range   Glucose-Capillary 198 (H) 65 - 99 mg/dL   Comment 1 Notify RN    Comment 2 Document in Chart   Basic metabolic panel     Status: Abnormal   Collection Time: 02/20/18  6:29 AM  Result Value Ref Range   Sodium 141 135 - 145 mmol/L   Potassium 3.8 3.5 - 5.1 mmol/L   Chloride 109 101 - 111 mmol/L   CO2 26 22 - 32 mmol/L   Glucose, Bld 44 (LL) 65 - 99 mg/dL    Comment: CRITICAL RESULT CALLED TO, READ BACK BY AND VERIFIED WITH: DISHMON,M AT 7:35AM ON 02/20/18 BY FESTERMAN,C    BUN 15 6 - 20 mg/dL   Creatinine, Ser 1.51 (H) 0.61 - 1.24 mg/dL   Calcium 9.2 8.9 - 10.3 mg/dL   GFR calc non Af Amer >60 >60 mL/min   GFR calc Af Amer >60 >60 mL/min    Comment: (NOTE) The eGFR has been calculated using the CKD EPI equation. This calculation has not been validated in all clinical situations. eGFR's persistently <60 mL/min signify possible Chronic Kidney Disease.    Anion gap 6 5 - 15    Comment: Performed at [facility], [address] [place], [place] [address]  Glucose, capillary     Status: Abnormal   Collection Time: 02/20/18  7:43 AM  Result Value Ref Range   Glucose-Capillary 54 (L) 65 - 99 mg/dL  Glucose, capillary     Status: None   Collection Time: 02/20/18  8:07 AM  Result Value Ref Range   Glucose-Capillary 92 65 - 99 mg/dL  Glucose,  capillary     Status: Abnormal   Collection Time: 02/20/18  9:17 AM  Result Value Ref Range   Glucose-Capillary 118 (H) 65 - 99 mg/dL  Glucose, capillary     Status: Abnormal   Collection Time: 02/20/18 11:35 AM  Result Value Ref Range   Glucose-Capillary 228 (H) 65 - 99 mg/dL  Vitamin B12     Status: None   Collection Time: 02/20/18  2:44 PM  Result Value Ref Range   Vitamin B-12 515 180 - 914 pg/mL    Comment: (NOTE) This assay is not validated for testing neonatal or myeloproliferative syndrome specimens for Vitamin B12 levels. Performed at [facility], [place] [address] [place], [place] [address]   TSH     Status: None   Collection Time: 02/20/18  2:44 PM  Result Value Ref Range   TSH 1.810  0.350 - 4.500 uIU/mL    Comment: Performed by a 3rd Generation assay with a functional sensitivity of <=0.01 uIU/mL. Performed at [facility], [address], [place], [place] [address]   Glucose, capillary     Status: Abnormal   Collection Time: 02/20/18  5:08 PM  Result Value Ref Range   Glucose-Capillary 344 (H) 65 - 99 mg/dL  Glucose, capillary     Status: Abnormal   Collection Time: 02/20/18  8:36 PM  Result Value Ref Range   Glucose-Capillary 312 (H) 65 - 99 mg/dL   Comment 1 Notify RN    Comment 2 Document in Chart   Glucose, capillary     Status: Abnormal   Collection Time: 02/21/18  7:47 AM  Result Value Ref Range   Glucose-Capillary 31 (LL) 65 - 99 mg/dL   Comment 1 Notify RN    Comment 2 Document in Chart   Glucose, capillary     Status: Abnormal   Collection Time: 02/21/18  8:54 AM  Result Value Ref Range   Glucose-Capillary 168 (H) 65 - 99 mg/dL  Glucose, capillary     Status: Abnormal   Collection Time: 02/21/18  8:55 AM  Result Value Ref Range   Glucose-Capillary 170 (H) 65 - 99 mg/dL   Comment 1 Notify RN    Comment 2 Document in Chart   Glucose, capillary     Status: Abnormal   Collection Time: 02/21/18 11:07 AM  Result Value Ref Range    Glucose-Capillary 305 (H) 65 - 99 mg/dL   Comment 1 Notify RN    Comment 2 Document in Chart    Mr Jodene Nam Head Wo Contrast  Result Date: 02/20/2018 CLINICAL DATA:  Acute right cerebellar, left thalamic, and left frontal lobe infarcts. Abnormal MRA complicated by motion artifact. Follow-up MRA. EXAM: MRA HEAD WITHOUT CONTRAST TECHNIQUE: Angiographic images of the Circle of Willis were obtained using MRA technique without intravenous contrast. COMPARISON:  MRI and MRA head 02/17/2018 FINDINGS: Atherosclerotic irregularity is present at the cavernous and precavernous internal carotid arteries bilaterally. There is less patient motion on today's study. No significant stenosis is present relative to the more distal vessels. The A1 and M1 segments are normal. No definite anterior communicating artery is present. MCA bifurcations are intact. ACA and MCA branch vessels are within normal limits. The left vertebral artery is the dominant vessel. PICA origin is visualized and normal. Right AICA is dominant. The basilar artery is normal. Both posterior cerebral arteries originate from basilar tip. There is some attenuation of distal PCA branch vessels bilaterally without a significant proximal stenosis or occlusion. No aneurysms are present. IMPRESSION: 1. Atherosclerotic changes within the cavernous and precavernous internal carotid arteries bilaterally, left greater than right, without significant stenosis relative to the more distal vessels. 2. Mild distal small vessel disease most evident and posterior circulation without other significant proximal stenosis, aneurysm, or branch vessel occlusion. Electronically Signed   By: San Morelle M.D.   On: 02/20/2018 09:11       Medical Problem List and Plan: 1.  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 2.  DVT Prophylaxis/Anticoagulation: Pharmaceutical: Lovenox 3. Pain Management: N/A 4. Mood: LCSW to follow for evaluation and  support.  5. Neuropsych: This patient is capable of making decisions on his own behalf. 6. Skin/Wound Care: routine pressure relief measures.  7. Fluids/Electrolytes/Nutrition: Monitor I/O. Check lytes in am.  8. T1DM with DKA:  Multiple episodes of DKA with Hgb A1C- 10.  Question compliance.  Continue Levemir at nights with meal coverage. Blood sugars very labile 40 - 300 range in past 24 hours. Will decrease evemir dose per diabetic coordinator recommendations and consult them for follow up/input while here.   9. HTN: Poorly controlled with hypertensive urgency this am?  May need Lisinopril discontinued to to renal issues and hyperkalemia at admission. Check lytes in am and monitor serially.  Will add clonidine prn for now and continue to titrate medications for tighter control.  10. Dyslipidemia: On lipitor 11. Leucocytosis: Monitor for signs of infection. Pan culture if higher or if febrile.  12. Acute on chronic renal failure?: Encourage fluid intake.  13. Positive RPR/T Pallidum: Check for other STDs. Will consider ID consult.  Post Admission Physician Evaluation: 1. Preadmission assessment reviewed and changes made below. 2. Functional deficits secondary  to bilateral CVA. 3. Patient is admitted to receive collaborative, interdisciplinary care between the physiatrist, rehab nursing staff, and therapy team. 4. Patient's level of medical complexity and substantial therapy needs in context of that medical necessity cannot be provided at a lesser intensity of care such as a SNF. 5. Patient has experienced substantial functional loss from his/her baseline which was documented above under the "Functional History" and "Functional Status" headings.  Judging by the patient's diagnosis, physical exam, and functional history, the patient has potential for functional progress which will result in measurable gains while on inpatient rehab.  These gains will be of substantial and practical use upon discharge   in facilitating mobility and self-care at the household level. 35. Physiatrist will provide 24 hour management of medical needs as well as oversight of the therapy plan/treatment and provide guidance as appropriate regarding the interaction of the two. 7. 24 hour rehab nursing will assist with safety, disease management and patient education  and help integrate therapy concepts, techniques,education, etc. 8. PT will assess and treat for/with: Lower extremity strength, range of motion, stamina, balance, functional mobility, safety, adaptive techniques and equipment, wound care, coping skills, pain control, education. Goals are: mod I. 9. OT will assess and treat for/with: ADL's, functional mobility, safety, upper extremity strength, adaptive techniques and equipment, wound mgt, ego support, and community reintegration.   Goals are: mod I. Therapy may proceed with showering this patient. 10. Case Management and Social Worker will assess and treat for psychological issues and discharge planning. 11. Team conference will be held weekly to assess progress toward goals and to determine barriers to discharge. 12. Patient will receive at least 3 hours of therapy per day at least 5 days per week. 13. ELOS: 6-10 days.       14. Prognosis:  good  I have personally performed a face to face diagnostic evaluation, including, but not limited to relevant history and physical exam findings, of this patient and developed relevant assessment and plan.  Additionally, I have reviewed and concur with the physician assistant's documentation above.  Delice Lesch, MD, ABPMR Bary Leriche, PA-C 02/21/2018

## 2018-02-22 NOTE — Progress Notes (Signed)
[facility] PMR Admission Coordinator Pre-Admission Assessment  Patient: Nathaniel [name] is an 32 y.o., male MRN: [id number] DOB: Feb 15, 1986 Height: _0  (188 cm) Weight: 72.6 kg (160 lb)  Insurance Information HMO:     PPO: Yes     PCP:       IPA:       80/20:       OTHER:   PRIMARY: [employer] Medicare      Policy#: [id number]      Subscriber: patient CM Name:  Leeroy Bock      Phone#: [phone]     Fax#: [phone] Pre-Cert#: [id number] for 7 days with update due on 02/27/18      Employer:  Not employed/ Disabled Benefits:  Phone #: [phone]     Name: On line portal Eff. Date: 09/20/17     Deduct:  $0      Out of Pocket Max: $4200 (met $0)      Life Max: N/A CIR: $250 days 1-6 max $1500 per admission      SNF: $0 days 1-20; $164 days 21-100 Outpatient: medical necessity     Co-Pay: $40/visit Home Health: 100%      Co-Pay: none DME: 80%     Co-Pay: 20% Providers: in network  Medicaid Application Date:        Case Manager:   Disability Application Date:        Case Worker:    Emergency Contact Information         Contact Information    Name Relation Home Work Mobile   Johnson,[name] Mother [phone]  [phone]      Current Medical History  Patient Admitting Diagnosis:  Multiple acute brain infarcts  History of Present Illness: a31 y.o.male,With history of hypertension, hyperlipidemia, diabetes mellituscame to hospital with complaints of right-sided numbness for past 2 days. Patient says that he had some weakness of right arm and leg. Denies any headache. No facial droop. No slurred speech.  No previous history of stroke.  CT scan of the head reviewed, showing bilateral infarcts.  Per report, new left internal capsule white matter changes seen with chronic small vessel ischemic disease though given right-sided numbness this may be acute. Patient also found to be in DKA,blood glucose elevated to 840 with anion gap of 19 patient started on IV  insulin DKA protocol.  MRI showed acute lacunar infarct of the left lateral thalamus/internal capsule corresponding to the CT finding yesterday. No associated hemorrhage or mass effect. 2. Additional small acute lacunar infarcts in the anterior left frontal lobe subcortical white matter (Left MCA territory). Subacute infarct of the Right superior cerebellum (Right SCA territory). No hemorrhage or mass effect.  PT/OT/SLP evaluations completed with recommendations for acute inpatient rehab admission.  Patient[facility]  [facility] has been reviewed by the rehabilitation admission coordinator and physician.  NIH Stroke scale: 4  Past Medical History      Past Medical History:  Diagnosis Date  . Diabetes mellitus    lantus/novolog  . Hyperlipidemia   . Hypertension   . Marijuana abuse    occaisionally  . Noncompliance with medication regimen   . Tobacco abuse    5/day    Family History   family history includes Diabetes in his father; Hypertension in his mother; Kidney failure in his father.  Prior Rehab/Hospitalizations Has the patient had major surgery during 100 days prior to admission? No  Current Medications Please see MAR from [facility]  Patients Current Diet:   Heart Healthy, carb modified, thin liquids  Precautions / Restrictions Precautions Precautions: Fall Precaution Comments: Pt fell out of bed in hospital room. Multiple falls past 2 days Restrictions Weight Bearing Restrictions: No   Has the patient had 2 or more falls or a fall with injury in the past year?No.  Mom reports 1 fall at the time of the strokes  Prior Activity Level Community (5-7x/wk): Went out daily and was driving up until 2 days PTA.  Prior Functional Level Self Care: Did the patient need help bathing, dressing, using the toilet or eating? Independent  Indoor Mobility: Did the patient need assistance with walking from room  to room (with or without device)? Independent  Stairs: Did the patient need assistance with internal or external stairs (with or without device)? Independent  Functional Cognition: Did the patient need help planning regular tasks such as shopping or remembering to take medications? Independent  Home Assistive Devices / Equipment Home Assistive Devices/Equipment: None Home Equipment: None  Prior Device Use: Indicate devices/aids used by the patient prior to current illness, exacerbation or injury? None   Prior Functional Level Current Functional Level  Bed Mobility  Independent  Other(Supervision)   Transfers  Independent  Min assist   Mobility - Walk/Wheelchair  Independent  Min assist(Ambulated 45 feet RW with min assist.)   Upper Body Dressing  Independent  Min assist(min guard assist.)   Lower Body Dressing  Independent  Mod assist   Grooming  Independent  Other(Set up)   Eating/Drinking  Independent  Other(supervision)   Toilet Transfer  Independent  Min assist   Bladder Continence   WDL  Voiding WDL   Bowel Management  WDL  Last BM 02/19/18   Stair Climbing  Independent Other(Not tried)   Counsellor  Verbal   Memory  WDL  WDL   Cooking/Meal Prep  Independent      Housework  Independent    Money Management  Independent    Driving  Yes, driving independently     Special needs/care consideration BiPAP/CPAP No CPM No Continuous Drip IV 0.9% NS 100 mL/hr Dialysis No        Life Vest No Oxygen No Special Bed No Trach Size No Wound Vac (area) No     Skin No                              Bowel mgmt: Last BM 02/19/18 Bladder mgmt: Voiding WDL Diabetic mgmt Yes, on insulin at home  Previous Home Environment Living Arrangements: Parent, Other relatives  Lives With: Family Available Help at Discharge: Family, Available 24 hours/day Type of Home: Mobile home Home  Layout: One level Home Access: Stairs to enter Entrance Stairs-Rails: Right Entrance Stairs-Number of Steps: 2 Bathroom Accessibility: Yes [place]: No  Discharge Living Setting Plans for Discharge Living Setting: Lives with (comment), Mobile Home(Lives with mom and 66 yo autistic sister.) Type of Home at Discharge: Mobile home(Double wide) Discharge Home Layout: One level Discharge Home Access: Stairs to enter Entrance Stairs-Number of Steps: 3-4 steps at the front entry Does the patient have any problems obtaining your medications?: No  Social/Family/Support Systems Patient Roles: Other (Comment)(Has a mom, autistic sister) Contact Information: Deetta Perla - mother Anticipated Caregiver: mother Anticipated Caregiver's Contact Information: [place] - mom - [phone] Ability/Limitations of Caregiver: Mom does not work.  Mom  cares for her mildly autistic daughter at home. Caregiver Availability: 24/7 Discharge Plan Discussed with Primary Caregiver: Yes Is Caregiver In Agreement with Plan?: Yes Does Caregiver/Family have Issues with Lodging/Transportation while Pt is in Rehab?: No  Goals/Additional Needs Patient/Family Goal for Rehab: PT/OT mod I and supervision goals Expected length of stay: 10-14 days Cultural Considerations: Member [employer] Needs: Heart healthy, carb mod, thin liquids Equipment Needs: TBD Additional Information: Patient is disabled from diabetes. Pt/Family Agrees to Admission and willing to participate: Yes Program Orientation Provided & Reviewed with Pt/Caregiver Including Roles  & Responsibilities: Yes  Patient Condition: I have reviewed all clinicals.  I have spoken with patient and with his mother.  Patient will benefit and can tolerate 3 hours of therapy a day.  He needs the coordinated approach of the rehab team to regain functional independence prior to home with his mother.  I have shared all information with  rehab MD and have approval for acute inpatient rehab admission.  Preadmission Screen Completed By:  Retta Diones, with brief updates by Gunnar Fusi 02/22/2018 9:11 AM ______________________________________________________________________   Discussed status with Dr. Posey Pronto on 02/22/18 at 0911 and received telephone approval for admission today.  Admission Coordinator:  Gunnar Fusi, time 0911/Date 02/22/18   Assessment/Plan: Diagnosis: Acute bilateral brain infarcts 1. Does the need for close, 24 hr/day  Medical supervision in concert with the patient's rehab needs make it unreasonable for this patient to be served in a less intensive setting? Yes 2. Co-Morbidities requiring supervision/potential complications: tobacco abuse, noncompliance, marijuana abuse, HTN, HLD, DM 3. Due to safety, disease management and patient education, does the patient require 24 hr/day rehab nursing? Yes 4. Does the patient require coordinated care of a physician, rehab nurse, PT (1-2 hrs/day, 5 days/week) and OT (1-2 hrs/day, 5 days/week) to address physical and functional deficits in the context of the above medical diagnosis(es)? Yes Addressing deficits in the following areas: balance, endurance, locomotion, strength, transferring, bathing, dressing, toileting and psychosocial support 5. Can the patient actively participate in an intensive therapy program of at least 3 hrs of therapy 5 days a week? Yes 6. The potential for patient to make measurable gains while on inpatient rehab is excellent 7. Anticipated functional outcomes upon discharge from inpatients are: supervision and min assist PT, supervision and min assist OT, n/a SLP 8. Estimated rehab length of stay to reach the above functional goals is: 13-17 days. 9. Does the patient have adequate social supports to accommodate these discharge functional goals? Yes 10. Anticipated D/C setting: Home 11. Anticipated post D/C treatments: HH therapy and Home  excercise program 12. Overall Rehab/Functional Prognosis: good    RECOMMENDATIONS: This patient's condition is appropriate for continued rehabilitative care in the following setting: CIR Patient has agreed to participate in recommended program. Yes Note that insurance prior authorization may be required for reimbursement for recommended care.   Delice Lesch, MD, ABPMR 02/22/18 Gunnar Fusi 02/22/2018          Revision History

## 2018-02-22 NOTE — Discharge Summary (Signed)
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 daily aspirin 162 mg p.o. daily Norvasc 5 mg p.o. daily and Levemir 21 units before meals twice daily along with Humalog 6 units before meals 3 times daily Discharge Diagnoses:  Active Problems:   Essential hypertension, benign   Type 1 diabetes mellitus with complication (HCC)   DKA (diabetic ketoacidoses) ([place])   CVA (cerebral vascular accident) [facility])   Discharge Condition: Good  Filed Weights   02/16/18 1828 02/17/18 0820  Weight: 72.6 kg (160 lb) 72.6 kg (160 lb)    History of present illness:  The patient is a 32 year old black male with juvenile type 1 diabetes who has chronic noncompliance has history of hypertension hyperlipidemia who presented with an episode of DKA after not taking his insulin for 36 hours was admitted to ICU put on Glucomander protocol his glycemic crisis was resolved within 12 hours his anion gap had narrowed he was placed on long and short-term acting insulin in the form of Levemir AC twice daily and sliding scale insulin his had multiple episodes of hypoglycemia post DKA was transferred to the floor where was discovered he had multiple difficulties with ambulating and right-sided weakness CT scan initially showed a small lacunar infarct subsequent  MRI revealed 3 areas of acute infarct in his brain he was seen in consultation by neurology who increased his aspirin to 162 mg/day he was placed on Lipitor 20 for his lipid control Levemir was titrated downward to 21-minute units before meals 3 times daily and Humalog was given 6 units before meals 3 times daily likewise placed on lisinopril 20 a day and Norvasc 5 a day for hypertensive control he was hemodynamically stable he was able to be discharged he understood the need for rehab and to tighten up his control of diabetes and other factors for this reason he was transferred to [facility] inpatient rehab to begin his physical as well as educational rehab on his medical conditions Hospital Course:  See HPI above  Procedures:    Consultations: Neurology and diabetic nurse coordinator physical therapy speech therapy Discharge Instructions  Discharge Instructions    Discharge instructions   Complete by:  As directed    Discharge patient   Complete by:  As directed    Discharge disposition:  [place] Not Defined   Discharge patient date:  02/22/2018     Allergies as of 02/22/2018      Reactions   Orange    Increases blood sugar immediately      Medication List    STOP taking these medications   LEVEMIR FLEXTOUCH 100 UNIT/ML Pen Generic drug:  Insulin Detemir Replaced by:  insulin detemir 100 UNIT/ML injection   naproxen 500 MG tablet Commonly known as:  NAPROSYN     TAKE these medications   amLODipine 5 MG tablet Commonly known as:  NORVASC Take 1 tablet (5 mg  total) by mouth daily.   aspirin 81 MG tablet Take 2 tablets (162 mg total) by mouth daily. Start taking on:  02/23/2018   atorvastatin 20 MG tablet Commonly known as:  LIPITOR Take 1 tablet (20 mg total) by mouth daily at 6 PM.   insulin aspart 100 UNIT/ML injection Commonly known as:  novoLOG Inject 6 Units into the skin 3 (three) times daily with meals.   insulin detemir 100 UNIT/ML  injection Commonly known as:  LEVEMIR Inject 0.21 mLs (21 Units total) into the skin daily with supper. Replaces:  LEVEMIR FLEXTOUCH 100 UNIT/ML Pen   lisinopril 20 MG tablet Commonly known as:  PRINIVIL,ZESTRIL Take 1 tablet (20 mg total) by mouth daily. Start taking on:  02/23/2018 What changed:    medication strength  how much to take      Allergies  Allergen Reactions  . Orange     Increases blood sugar immediately       The results of significant diagnostics from this hospitalization (including imaging, microbiology, ancillary and laboratory) are listed below for reference.    Significant Diagnostic Studies: Dg Chest 2 View  Result Date: 02/16/2018 CLINICAL DATA:  Stroke.  No chest pain. EXAM: CHEST - 2 VIEW COMPARISON:  July 19, 2016 FINDINGS: The heart size and mediastinal contours are within normal limits. Both lungs are clear. The visualized skeletal structures are unremarkable. IMPRESSION: No active cardiopulmonary disease. Electronically Signed   By: Dorise Bullion III M.D   On: 02/16/2018 22:48   Ct Head Wo Contrast  Result Date: 02/16/2018 CLINICAL DATA:  Progressively worsening right hand numbness since yesterday. History of diabetes. EXAM: CT HEAD WITHOUT CONTRAST TECHNIQUE: Contiguous axial images were obtained from the base of the skull through the vertex without intravenous contrast. COMPARISON:  MRI of the head January 09, 2014 FINDINGS: BRAIN: No intraparenchymal hemorrhage, mass effect nor midline shift. The ventricles and sulci are normal. No acute large vascular territory infarcts. No abnormal extra-axial fluid collections. New patchy hypodensity LEFT posterior limb of the internal capsule. Basal cisterns are patent. VASCULAR: Mild calcific atherosclerosis carotid siphons. SKULL/SOFT TISSUES: No skull fracture. No significant soft tissue swelling. ORBITS/SINUSES: The included ocular globes and orbital contents are normal.LEFT middle ear and mastoid effusion.  Status post bilateral ocular lens implants. OTHER: None. IMPRESSION: 1. New LEFT internal capsule white matter changes seen with chronic small vessel ischemic disease though, given RIGHT-sided numbness this may be acute. 2. LEFT middle ear and mastoid effusion. 3. Mild atherosclerosis. Electronically Signed   By: Elon Alas M.D.   On: 02/16/2018 19:57   [name] OJ Contrast  Result Date: 02/20/2018 CLINICAL DATA:  Acute right cerebellar, left thalamic, and left frontal lobe infarcts. Abnormal MRA complicated by motion artifact. Follow-up MRA. EXAM: MRA HEAD WITHOUT CONTRAST TECHNIQUE: Angiographic images of the Circle of Willis were obtained using MRA technique without intravenous contrast. COMPARISON:  MRI and MRA head 02/17/2018 FINDINGS: Atherosclerotic irregularity is present at the cavernous and precavernous internal carotid arteries bilaterally. There is less patient motion on today's study. No significant stenosis is present relative to the more distal vessels. The A1 and M1 segments are normal. No definite anterior communicating artery is present. MCA bifurcations are intact. ACA and MCA branch vessels are within normal limits. The left vertebral artery is the dominant vessel. PICA origin is visualized and normal. Right AICA is dominant. The basilar artery is normal. Both posterior cerebral arteries originate from basilar tip. There is some attenuation of distal PCA  branch vessels bilaterally without a significant proximal stenosis or occlusion. No aneurysms are present. IMPRESSION: 1. Atherosclerotic changes within the cavernous and precavernous internal carotid arteries bilaterally, left greater than right, without significant stenosis relative to the more distal vessels. 2. Mild distal small vessel disease most evident and posterior circulation without other significant proximal stenosis, aneurysm, or branch vessel occlusion. Electronically Signed   By: San Morelle M.D.   On:  02/20/2018 09:11   Mr Brain Wo Contrast  Result Date: 02/17/2018 CLINICAL DATA:  32 year old male with progressive right side numbness in the arm and leg since yesterday. Diabetes. EXAM: MRI HEAD WITHOUT CONTRAST MRA HEAD WITHOUT CONTRAST TECHNIQUE: Multiplanar, multiecho pulse sequences of the brain and surrounding structures were obtained without intravenous contrast. Angiographic images of the head were obtained using MRA technique without contrast. COMPARISON:  Head CT without contrast 02/16/2018. Cervical spine MRI 11/19/2015. Brain MRI 01/09/2014. FINDINGS: MRI HEAD FINDINGS Brain: Oval 17 millimeter focus of restricted diffusion in the lateral left thalamus and posterior limb left internal capsule as seen on series 5, image 49. Associated T2 and FLAIR hyperintensity. No hemorrhage or mass effect. There are also 2 smaller, up to 10 millimeters, subcortical white matter foci of restricted diffusion in the anterior left frontal lobe (series 3, image 82). Similar T2 and FLAIR hyperintensity with no hemorrhage or mass effect. Furthermore, there is a 12 millimeter area of abnormal trace diffusion in the right superior cerebellum (series 3, image 73) which appears T2 and FLAIR hyperintense but isointense on ADC compatible with a small subacute infarct. No hemorrhage or mass effect. Multiple chronic small lacunar infarcts bilateral cerebellum and dorsal right pons are new since 2013. Outside of the acute findings the deep gray matter nuclei remain normal. Periventricular white matter T2 and FLAIR hyperintensity has progressed since 2013 outside of the acute areas, including the right periatrial white matter on series 13, image 25. No cerebral cortical encephalomalacia identified. No chronic cerebral blood products identified. No midline shift, mass effect, evidence of mass lesion, ventriculomegaly, extra-axial collection or acute intracranial hemorrhage. Cervicomedullary junction and pituitary are within normal  limits. Vascular: Major intracranial vascular flow voids are stable since 2015. Skull and upper cervical spine: Negative visible cervical spine. Visualized bone marrow signal is within normal limits. Sinuses/Orbits: Stable orbits soft tissues with postoperative changes to both globes. Mild paranasal sinus mucosal thickening has regressed. Other: Chronic left mastoid effusion is stable to mildly improved. Trace right mastoid fluid is stable. Grossly normal visible internal auditory structures. Grossly negative scalp and face soft tissues. MRA HEAD FINDINGS Antegrade flow in the posterior circulation with codominant distal vertebral arteries. No stenosis. Normal left PICA origin. The right AICA may be dominant. Patent vertebrobasilar junction and basilar artery without stenosis. Patent SCA and PCA origins. Both posterior communicating arteries are present. Bilateral PCA branches are within normal limits. Antegrade flow in both ICA siphons with atherosclerotic versus artifactual bilateral cavernous and proximal supraclinoid siphon irregularity and narrowing (series 103, image 7). Normal ophthalmic and posterior communicating artery origins. Normal appearance of the supraclinoid ICAs. Patent carotid termini. Normal MCA and ACA origins. Anterior communicating artery and visible ACA branches are normal. The left MCA bifurcates early. The left M1, bifurcation, and visible left MCA branches are within normal limits. The right MCA M1, bifurcation, and visible right MCA branches are within normal limits. IMPRESSION: 1. Acute lacunar infarct of the Left lateral thalamus/internal capsule corresponding to the CT finding yesterday. No associated hemorrhage or mass effect. 2. Additional small  acute lacunar infarcts in the anterior left frontal lobe subcortical white matter (Left MCA territory). Subacute infarct of the Right superior cerebellum (Right SCA territory). No hemorrhage or mass effect. 3. Multiple underlying chronic  lacunar infarcts in the cerebellum and the right pons are new since 2015. 4. Intracranial MRA remarkable for moderate irregularity and stenosis of the bilateral cavernous ICAs. This is suspicious for advanced ICA atherosclerosis in this clinical setting, but might be exacerbated by artifact from hyperplastic sphenoid paranasal sinuses. Electronically Signed   By: Genevie Ann M.D.   On: 02/17/2018 07:30   US Carotid Bilateral (at [facility] Only)  Result Date: 02/17/2018 CLINICAL DATA:  Acute cerebral infarction. EXAM: BILATERAL CAROTID DUPLEX ULTRASOUND TECHNIQUE: Pearline Cables scale imaging, color Doppler and duplex ultrasound were performed of bilateral carotid and vertebral arteries in the neck. COMPARISON:  None. FINDINGS: Criteria: Quantification of carotid stenosis is based on velocity parameters that correlate the residual internal carotid diameter with NASCET-based stenosis levels, using the diameter of the distal internal carotid lumen as the denominator for stenosis measurement. The following velocity measurements were obtained: RIGHT ICA:  77/23 cm/sec CCA:  008/67 cm/sec SYSTOLIC ICA/CCA RATIO:  0.6 ECA:  217 cm/sec LEFT ICA:  80/23 cm/sec CCA:  619/50 cm/sec SYSTOLIC ICA/CCA RATIO:  0.7 ECA:  114 cm/sec RIGHT CAROTID ARTERY: No focal plaque identified. Normal velocities and waveforms. No evidence of right carotid stenosis. RIGHT VERTEBRAL ARTERY: Antegrade flow with normal waveform and velocity. LEFT CAROTID ARTERY: Trace plaque at the level of the left carotid bulb. No evidence of ICA plaque or stenosis. LEFT VERTEBRAL ARTERY: Antegrade flow with normal waveform and velocity. IMPRESSION: Minimal plaque at the level of the left carotid bulb. No evidence of bilateral ICA plaque or stenosis. Electronically Signed   By: Aletta Edouard M.D.   On: 02/17/2018 08:22   Mr Jodene Nam Head/brain DT Cm  Result Date: 02/17/2018 CLINICAL DATA:  32 year old male with progressive right side numbness in the arm and leg since  yesterday. Diabetes. EXAM: MRI HEAD WITHOUT CONTRAST MRA HEAD WITHOUT CONTRAST TECHNIQUE: Multiplanar, multiecho pulse sequences of the brain and surrounding structures were obtained without intravenous contrast. Angiographic images of the head were obtained using MRA technique without contrast. COMPARISON:  Head CT without contrast 02/16/2018. Cervical spine MRI 11/19/2015. Brain MRI 01/09/2014. FINDINGS: MRI HEAD FINDINGS Brain: Oval 17 millimeter focus of restricted diffusion in the lateral left thalamus and posterior limb left internal capsule as seen on series 5, image 49. Associated T2 and FLAIR hyperintensity. No hemorrhage or mass effect. There are also 2 smaller, up to 10 millimeters, subcortical white matter foci of restricted diffusion in the anterior left frontal lobe (series 3, image 82). Similar T2 and FLAIR hyperintensity with no hemorrhage or mass effect. Furthermore, there is a 12 millimeter area of abnormal trace diffusion in the right superior cerebellum (series 3, image 73) which appears T2 and FLAIR hyperintense but isointense on ADC compatible with a small subacute infarct. No hemorrhage or mass effect. Multiple chronic small lacunar infarcts bilateral cerebellum and dorsal right pons are new since 2013. Outside of the acute findings the deep gray matter nuclei remain normal. Periventricular white matter T2 and FLAIR hyperintensity has progressed since 2013 outside of the acute areas, including the right periatrial white matter on series 13, image 25. No cerebral cortical encephalomalacia identified. No chronic cerebral blood products identified. No midline shift, mass effect, evidence of mass lesion, ventriculomegaly, extra-axial collection or acute intracranial hemorrhage. Cervicomedullary junction and pituitary are  within normal limits. Vascular: Major intracranial vascular flow voids are stable since 2015. Skull and upper cervical spine: Negative visible cervical spine. Visualized bone  marrow signal is within normal limits. Sinuses/Orbits: Stable orbits soft tissues with postoperative changes to both globes. Mild paranasal sinus mucosal thickening has regressed. Other: Chronic left mastoid effusion is stable to mildly improved. Trace right mastoid fluid is stable. Grossly normal visible internal auditory structures. Grossly negative scalp and face soft tissues. MRA HEAD FINDINGS Antegrade flow in the posterior circulation with codominant distal vertebral arteries. No stenosis. Normal left PICA origin. The right AICA may be dominant. Patent vertebrobasilar junction and basilar artery without stenosis. Patent SCA and PCA origins. Both posterior communicating arteries are present. Bilateral PCA branches are within normal limits. Antegrade flow in both ICA siphons with atherosclerotic versus artifactual bilateral cavernous and proximal supraclinoid siphon irregularity and narrowing (series 103, image 7). Normal ophthalmic and posterior communicating artery origins. Normal appearance of the supraclinoid ICAs. Patent carotid termini. Normal MCA and ACA origins. Anterior communicating artery and visible ACA branches are normal. The left MCA bifurcates early. The left M1, bifurcation, and visible left MCA branches are within normal limits. The right MCA M1, bifurcation, and visible right MCA branches are within normal limits. IMPRESSION: 1. Acute lacunar infarct of the Left lateral thalamus/internal capsule corresponding to the CT finding yesterday. No associated hemorrhage or mass effect. 2. Additional small acute lacunar infarcts in the anterior left frontal lobe subcortical white matter (Left MCA territory). Subacute infarct of the Right superior cerebellum (Right SCA territory). No hemorrhage or mass effect. 3. Multiple underlying chronic lacunar infarcts in the cerebellum and the right pons are new since 2015. 4. Intracranial MRA remarkable for moderate irregularity and stenosis of the bilateral  cavernous ICAs. This is suspicious for advanced ICA atherosclerosis in this clinical setting, but might be exacerbated by artifact from hyperplastic sphenoid paranasal sinuses. Electronically Signed   By: Genevie Ann M.D.   On: 02/17/2018 07:30    Microbiology: No results found for this or any previous visit (from the past 240 hour(s)).   Labs: Basic Metabolic Panel: Recent Labs  Lab 02/17/18 1034 02/17/18 1438 02/18/18 0643 02/19/18 0559 02/20/18 0629  NA 136 135 141 137 141  K 3.7 3.7 3.5 3.7 3.8  CL 106 104 109 106 109  CO2 24 24 25 24 26   GLUCOSE 121* 123* 30* 197* 44*  BUN 28* 26* 17 11 15   CREATININE 1.79* 1.70* 1.58* 1.34* 1.51*  CALCIUM 8.9 8.9 9.1 8.8* 9.2   Liver Function Tests: Recent Labs  Lab 02/16/18 1910 02/19/18 1201  AST 21 23  ALT 19 16*  ALKPHOS 105 76  BILITOT 2.2* 0.7  PROT 8.2* 7.0  ALBUMIN 3.8 3.1*   No results for input(s): LIPASE, AMYLASE in the last 168 hours. No results for input(s): AMMONIA in the last 168 hours. CBC: Recent Labs  Lab 02/16/18 1910  WBC 14.3*  NEUTROABS 11.1*  HGB 13.1  HCT 41.4  MCV 92.4  PLT 310   Cardiac Enzymes: Recent Labs  Lab 02/16/18 1909  TROPONINI <0.03   BNP: BNP (last 3 results) No results for input(s): BNP in the last 8760 hours.  ProBNP (last 3 results) No results for input(s): PROBNP in the last 8760 hours.  CBG: Recent Labs  Lab 02/21/18 1107 02/21/18 1621 02/21/18 2018 02/22/18 0804 02/22/18 0843  GLUCAP 305* 192* 81 41* 110*       Signed:  Domingue Coltrain M   Pager: [phone]  02/22/2018, 11:01 AM

## 2018-02-22 NOTE — Progress Notes (Signed)
Pt d/c'd to [facility] via [facility]. No distress noted. Report called previously to [facility].

## 2018-02-22 NOTE — Progress Notes (Signed)
Inpatient Diabetes Program Recommendations  AACE/ADA: New Consensus Statement on Inpatient Glycemic Control (2015)  Target Ranges:  Prepandial:   less than 140 mg/dL      Peak postprandial:   less than 180 mg/dL (1-2 hours)      Critically ill patients:  140 - 180 mg/dL   Results for Nathaniel [name], Nathaniel [name] (MRN [id number]) as of 02/22/2018 09:11  Ref. Range 02/21/2018 07:47 02/21/2018 08:54 02/21/2018 08:55 02/21/2018 11:07 02/21/2018 16:21 02/21/2018 20:18 02/22/2018 08:04 02/22/2018 08:43  Glucose-Capillary Latest Ref Range: 65 - 99 mg/dL 31 (LL) 168 (H) 170 (H) 305 (H) 192 (H) 81 41 (LL) 110 (H)   Review of Glycemic Control  Diabetes history:DM1 (makes no insulin; requires basal, correction, and meal coverage insulin) Outpatient Diabetes medications:Levemir 50 units BID Current orders for Inpatient glycemic control:Levemir 21 units daily, Novolog 0-9 units TID with meals, Novolog 0-5 units QHS,, Novolog 6 units TID with meals for meal coverage  Inpatient Diabetes Program Recommendations: Basal: Fasting glucose 41 mg/dl today. Please consider decreasing Levemir to 16 units daily.  Thanks, Barnie Alderman, RN, MSN, CDE Diabetes Coordinator Inpatient Diabetes Program [phone] (Team Pager from 8am to 5pm)

## 2018-02-22 NOTE — Progress Notes (Signed)
Inpatient Diabetes Program Recommendations  AACE/ADA: New Consensus Statement on Inpatient Glycemic Control (2019)  Target Ranges:  Prepandial:   less than 140 mg/dL      Peak postprandial:   less than 180 mg/dL (1-2 hours)      Critically ill patients:  140 - 180 mg/dL  Results for DAMONTRE, MILLEA (MRN [id number]) as of 02/22/2018 15:26  Ref. Range 02/22/2018 08:04 02/22/2018 08:43 02/22/2018 11:33  Glucose-Capillary Latest Ref Range: 65 - 99 mg/dL 41 (LL) 110 (H) 380 (H)   Results for JAVARIUS, TSOSIE (MRN [id number]) as of 02/22/2018 09:11  Ref. Range 02/21/2018 07:47 02/21/2018 08:54 02/21/2018 08:55 02/21/2018 11:07 02/21/2018 16:21 02/21/2018 20:18 02/22/2018 08:04 02/22/2018 08:43  Glucose-Capillary Latest Ref Range: 65 - 99 mg/dL 31 (LL) 168 (H) 170 (H) 305 (H) 192 (H) 81 41 (LL) 110 (H)   Review of Glycemic Control  Diabetes history:DM1 (makes no insulin; requires basal, correction, and meal coverage insulin) Outpatient Diabetes medications:Levemir 50 units BID Current orders for Inpatient glycemic control:Levemir 16 units daily, Novolog 0-9 units TID with meals, Novolog 0-5 units QHS,, Novolog 6 units TID with meals for meal coverage   NOTE: Noted consult for Diabetes Coordinator. Inpatient Diabetes Coordinator spoke with patient on 02/20/2018 regarding A1C and control. Patient has DM1 and therefore makes no insulin. Patient will require insulin for basal, correction, and meal coverage. Prior to hospitalization patient was taking large doses of basal insulin which he was using to cover all insulin needs and having frequent hypoglycemia and was not monitoring glucose at all. Noted glucose of 41 mg/dl this morning and glucose up to 380 mg/dl at 11:33 am today. Hyperglycemia noted at 11:33 am is likely due to patient eating meal and not getting any meal coverage since his fasting glucose was 41 mg/dl this morning. Noted Levemir was decreased to 16 units daily as recommended. Agree with current insulin  regimen. Will continue to follow daily and make further recommendations if needed based on glycemic trends. Once patient is ready to be discharged, strongly recommend patient be discharged on basal/bolus regimen similar to inpatient insulin orders if glucose is fairly well controlled.  Thanks, Barnie Alderman, RN, MSN, CDE Diabetes Coordinator Inpatient Diabetes Program [phone] (Team Pager from 8am to 5pm)

## 2018-02-23 ENCOUNTER — Inpatient Hospital Stay (HOSPITAL_COMMUNITY): Payer: Medicare HMO | Admitting: Occupational Therapy

## 2018-02-23 ENCOUNTER — Inpatient Hospital Stay (HOSPITAL_COMMUNITY): Payer: Medicare HMO | Admitting: Physical Therapy

## 2018-02-23 DIAGNOSIS — E1010 Type 1 diabetes mellitus with ketoacidosis without coma: Secondary | ICD-10-CM

## 2018-02-23 DIAGNOSIS — E119 Type 2 diabetes mellitus without complications: Secondary | ICD-10-CM

## 2018-02-23 DIAGNOSIS — I6381 Other cerebral infarction due to occlusion or stenosis of small artery: Secondary | ICD-10-CM

## 2018-02-23 DIAGNOSIS — F17200 Nicotine dependence, unspecified, uncomplicated: Secondary | ICD-10-CM

## 2018-02-23 DIAGNOSIS — E782 Mixed hyperlipidemia: Secondary | ICD-10-CM

## 2018-02-23 DIAGNOSIS — E785 Hyperlipidemia, unspecified: Secondary | ICD-10-CM

## 2018-02-23 DIAGNOSIS — Z8249 Family history of ischemic heart disease and other diseases of the circulatory system: Secondary | ICD-10-CM

## 2018-02-23 DIAGNOSIS — E162 Hypoglycemia, unspecified: Secondary | ICD-10-CM

## 2018-02-23 DIAGNOSIS — A530 Latent syphilis, unspecified as early or late: Secondary | ICD-10-CM

## 2018-02-23 DIAGNOSIS — I63512 Cerebral infarction due to unspecified occlusion or stenosis of left middle cerebral artery: Secondary | ICD-10-CM

## 2018-02-23 DIAGNOSIS — Z833 Family history of diabetes mellitus: Secondary | ICD-10-CM

## 2018-02-23 DIAGNOSIS — I10 Essential (primary) hypertension: Secondary | ICD-10-CM

## 2018-02-23 DIAGNOSIS — E1065 Type 1 diabetes mellitus with hyperglycemia: Secondary | ICD-10-CM

## 2018-02-23 DIAGNOSIS — F17210 Nicotine dependence, cigarettes, uncomplicated: Secondary | ICD-10-CM

## 2018-02-23 DIAGNOSIS — A523 Neurosyphilis, unspecified: Secondary | ICD-10-CM

## 2018-02-23 DIAGNOSIS — Z91018 Allergy to other foods: Secondary | ICD-10-CM

## 2018-02-23 DIAGNOSIS — I6340 Cerebral infarction due to embolism of unspecified cerebral artery: Secondary | ICD-10-CM

## 2018-02-23 DIAGNOSIS — Z841 Family history of disorders of kidney and ureter: Secondary | ICD-10-CM

## 2018-02-23 DIAGNOSIS — R739 Hyperglycemia, unspecified: Secondary | ICD-10-CM

## 2018-02-23 LAB — COMPREHENSIVE METABOLIC PANEL
ALK PHOS: 76 U/L (ref 38–126)
ALT: 21 U/L (ref 17–63)
AST: 23 U/L (ref 15–41)
Albumin: 2.9 g/dL — ABNORMAL LOW (ref 3.5–5.0)
Anion gap: 9 (ref 5–15)
BILIRUBIN TOTAL: 0.8 mg/dL (ref 0.3–1.2)
BUN: 15 mg/dL (ref 6–20)
CALCIUM: 9.4 mg/dL (ref 8.9–10.3)
CO2: 28 mmol/L (ref 22–32)
CREATININE: 1.66 mg/dL — AB (ref 0.61–1.24)
Chloride: 106 mmol/L (ref 101–111)
GFR calc Af Amer: >60 mL/min (ref >60)
GFR, EST NON AFRICAN AMERICAN: 54 mL/min — AB (ref >60)
GLUCOSE: 78 mg/dL (ref 65–99)
POTASSIUM: 4 mmol/L (ref 3.5–5.1)
Sodium: 143 mmol/L (ref 135–145)
TOTAL PROTEIN: 6.2 g/dL — AB (ref 6.5–8.1)

## 2018-02-23 LAB — GLUCOSE, CAPILLARY
GLUCOSE-CAPILLARY: 347 mg/dL — AB (ref 65–99)
GLUCOSE-CAPILLARY: 44 mg/dL — AB (ref 65–99)
GLUCOSE-CAPILLARY: 594 mg/dL — AB (ref 65–99)
GLUCOSE-CAPILLARY: 431 mg/dL — AB (ref 65–99)
GLUCOSE-CAPILLARY: 303 mg/dL — AB (ref 65–99)
GLUCOSE-CAPILLARY: 154 mg/dL — AB (ref 65–99)
Glucose-Capillary: 119 mg/dL — ABNORMAL HIGH (ref 65–99)
Glucose-Capillary: 51 mg/dL — ABNORMAL LOW (ref 65–99)
Glucose-Capillary: 566 mg/dL (ref 65–99)
Glucose-Capillary: 254 mg/dL — ABNORMAL HIGH (ref 65–99)
Glucose-Capillary: 218 mg/dL — ABNORMAL HIGH (ref 65–99)

## 2018-02-23 LAB — CBC WITH DIFFERENTIAL/PLATELET
Abs Immature Granulocytes: 0 10*3/uL (ref 0.0–0.1)
Basophils Absolute: 0.1 10*3/uL (ref 0.0–0.1)
Basophils Relative: 1 %
EOS PCT: 6 %
Eosinophils Absolute: 0.4 10*3/uL (ref 0.0–0.7)
HEMATOCRIT: 36.7 % — AB (ref 39.0–52.0)
HEMOGLOBIN: 11.9 g/dL — AB (ref 13.0–17.0)
Immature Granulocytes: 0 %
LYMPHS ABS: 2.5 10*3/uL (ref 0.7–4.0)
LYMPHS PCT: 33 %
MCH: 28.6 pg (ref 26.0–34.0)
MCHC: 32.4 g/dL (ref 30.0–36.0)
MCV: 88.2 fL (ref 78.0–100.0)
MONOS PCT: 5 %
Monocytes Absolute: 0.4 10*3/uL (ref 0.1–1.0)
NEUTROS PCT: 55 %
Neutro Abs: 4.2 10*3/uL (ref 1.7–7.7)
Platelets: 240 10*3/uL (ref 150–400)
RBC: 4.16 MIL/uL — ABNORMAL LOW (ref 4.22–5.81)
RDW: 14.2 % (ref 11.5–15.5)
WBC: 7.7 10*3/uL (ref 4.0–10.5)

## 2018-02-23 LAB — GLUCOSE, RANDOM: GLUCOSE: 479 mg/dL — AB (ref 65–99)

## 2018-02-23 MED ORDER — ENOXAPARIN SODIUM 40 MG/0.4ML ~~LOC~~ SOLN
40.0000 mg | SUBCUTANEOUS | Status: DC
  Administered 2018-02-23 – 2018-03-07 (×13): 40 mg via SUBCUTANEOUS
  Filled 2018-02-23 (×13): qty 0.4

## 2018-02-23 MED ORDER — INSULIN ASPART 100 UNIT/ML ~~LOC~~ SOLN
4.0000 [IU] | Freq: Three times a day (TID) | SUBCUTANEOUS | Status: DC
  Administered 2018-02-23 – 2018-02-27 (×10): 4 [IU] via SUBCUTANEOUS

## 2018-02-23 MED ORDER — INSULIN DETEMIR 100 UNIT/ML ~~LOC~~ SOLN
6.0000 [IU] | Freq: Two times a day (BID) | SUBCUTANEOUS | Status: DC
  Administered 2018-02-23 – 2018-02-24 (×2): 6 [IU] via SUBCUTANEOUS
  Filled 2018-02-23 (×2): qty 0.06

## 2018-02-23 MED ORDER — PENICILLIN G POT IN DEXTROSE 60000 UNIT/ML IV SOLN
3.0000 10*6.[IU] | INTRAVENOUS | Status: DC
  Filled 2018-02-23 (×3): qty 50

## 2018-02-23 MED ORDER — INSULIN DETEMIR 100 UNIT/ML ~~LOC~~ SOLN
12.0000 [IU] | Freq: Every day | SUBCUTANEOUS | Status: DC
  Filled 2018-02-23: qty 0.12

## 2018-02-23 MED ORDER — INSULIN DETEMIR 100 UNIT/ML ~~LOC~~ SOLN
6.0000 [IU] | Freq: Every day | SUBCUTANEOUS | Status: DC
  Administered 2018-02-23: 6 [IU] via SUBCUTANEOUS
  Filled 2018-02-23: qty 0.06

## 2018-02-23 MED ORDER — ENOXAPARIN SODIUM 40 MG/0.4ML ~~LOC~~ SOLN: 40.0000 mg | SUBCUTANEOUS | Status: DC

## 2018-02-23 MED ORDER — ASPIRIN EC 81 MG PO TBEC: 81.0000 mg | DELAYED_RELEASE_TABLET | Freq: Every day | ORAL | Status: DC

## 2018-02-23 MED ORDER — SODIUM CHLORIDE 0.9 % IV SOLN
INTRAVENOUS | Status: AC
  Administered 2018-02-23 – 2018-03-05 (×59): via INTRAVENOUS
  Filled 2018-02-23 (×71): qty 50

## 2018-02-23 MED ORDER — PENICILLIN G BENZATHINE 1200000 UNIT/2ML IM SUSP
2.4000 10*6.[IU] | INTRAMUSCULAR | Status: DC
Start: 2018-02-23 — End: 2018-03-07
  Administered 2018-02-23 – 2018-03-02 (×2): 2.4 10*6.[IU] via INTRAMUSCULAR
  Filled 2018-02-23 (×5): qty 4

## 2018-02-23 MED ORDER — GLUCOSE 40 % PO GEL: 1.0000 | Freq: Once | ORAL | Status: DC

## 2018-02-23 MED ORDER — GLUCOSE 40 % PO GEL
ORAL | Status: AC
  Filled 2018-02-23: qty 1

## 2018-02-23 MED ORDER — ASPIRIN EC 81 MG PO TBEC
81.0000 mg | DELAYED_RELEASE_TABLET | Freq: Every day | ORAL | Status: DC
  Administered 2018-02-24 – 2018-03-08 (×13): 81 mg via ORAL
  Filled 2018-02-23 (×13): qty 1

## 2018-02-23 MED ORDER — CLOPIDOGREL BISULFATE 75 MG PO TABS
75.0000 mg | ORAL_TABLET | Freq: Every day | ORAL | Status: DC
  Administered 2018-02-23 – 2018-03-08 (×14): 75 mg via ORAL
  Filled 2018-02-23 (×14): qty 1

## 2018-02-23 NOTE — Progress Notes (Signed)
Hypoglycemic Event  CBG: 58  Treatment: snack  Symptoms: none  Follow-up CBG: Time: CBG Result:92  Possible Reasons for Event: poor controlled DM  Comments/MD notified:Dan Sebring

## 2018-02-23 NOTE — Progress Notes (Addendum)
Hypoglycemic Event  CBG: 49  Treatment: snack  Symptoms: none, hungry  Follow-up CBG: Time: 0706 CBG Result:51, ordered to wait for further orders after recheck Re check 0715 CBG result 119  Possible Reasons for Event: new admit/ DM type 1, uncontrolled/ DM coordinator consult pending  Comments/MD notified: Oliver Pila

## 2018-02-23 NOTE — Evaluation (Signed)
Physical Therapy Assessment and Plan  Patient Details  Name: Nathaniel [name] MRN: [id number] Date of Birth: 1986-05-19  PT Diagnosis: Abnormality of gait, Ataxic gait, Coordination disorder, Hemiparesis dominant and Impaired sensation Rehab Potential: Good ELOS: 10-12 days   Today's Date: 02/23/2018 PT Individual Time: 0920-1000 PT Individual Time Calculation (min): 40 min    Problem List:  Patient Active Problem List   Diagnosis Date Noted  . Acute ischemic left MCA stroke ([place]) 02/22/2018  . Abnormality of gait following cerebrovascular accident (CVA)   . 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 Essential hypertension   . Dyslipidemia   . Syphilis   . DKA (diabetic ketoacidoses) ([place]) 02/16/2018  . CVA (cerebral vascular accident) ([place]) 02/16/2018  . Acute renal failure (ARF) ([place]) 10/21/2016  . Diabetic ketoacidosis without coma associated with type 1 diabetes mellitus ([place])   . Hyperbilirubinemia   . Hyponatremia 04/15/2016  . DM (diabetes mellitus) type 1, uncontrolled, with ketoacidosis ([place]) 04/15/2016  . Hyperglycemia 04/15/2016  . AKI (acute kidney injury) ([place]) 01/04/2016  . Malignant hypertension 01/04/2016  . Noncompliance with medications 01/04/2016  . Intractable nausea and vomiting 04/01/2015  . Gastroparesis 04/01/2015  . Type 1 diabetes mellitus with complication ([place]) 50/27/7412  . Chronic hypertension   . Nausea with vomiting   . Abscess, gluteal, right 06/21/2014  . Nausea and vomiting 04/08/2013  . Hyperglycemia without ketosis 04/08/2013  . Essential hypertension, benign 04/08/2013  . Hypercalcemia 07/29/2011  . Vomiting 07/28/2011  . Leukocytosis 07/28/2011  . Hypokalemia 07/28/2011  . DKA, type 1 ([place]) 07/27/2011  . Dehydration 07/27/2011  . Tobacco abuse 07/27/2011  . Marijuana abuse 07/27/2011    Past Medical History:  Past Medical History:  Diagnosis Date  . Diabetes mellitus    lantus/novolog  .  Hyperlipidemia   . Hypertension   . Marijuana abuse    occaisionally  . Noncompliance with medication regimen   . Tobacco abuse    5/day   Past Surgical History:  Past Surgical History:  Procedure Laterality Date  . EYE SURGERY      Assessment & Plan Clinical Impression: a 32 y.o. male, With history of hypertension, hyperlipidemia, diabetes mellitus came to hospital with complaints of right-sided numbness for past 2 days.  Patient says that he had some weakness of right arm and leg.  Denies any headache.  No facial droop.  No slurred speech.  No previous history of stroke.  CT scan of the head reviewed, showing bilateral infarcts.  Per report, new left internal capsule white matter changes seen with chronic small vessel ischemic disease though given right-sided numbness this may be acute. Patient also found to be in DKA, blood glucose elevated to 840 with anion gap of 19 patient started on IV insulin DKA protocol.  MRI showed acute lacunar infarct of the left lateral thalamus/internal capsule corresponding to the CT finding yesterday. No associated hemorrhage or mass effect. 2. Additional small acute lacunar infarcts in the anterior left frontal lobe subcortical white matter (Left MCA territory). Subacute infarct of the Right superior cerebellum (Right SCA territory). No hemorrhage or mass effect.  PT/OT/SLP evaluations completed with recommendations for acute inpatient rehab admission.  Patient transferred to CIR on 02/22/2018 .   Patient currently requires min with mobility secondary to muscle weakness, impaired timing and sequencing, abnormal tone, unbalanced muscle activation, ataxia and decreased coordination and decreased standing balance, decreased postural control, hemiplegia and decreased balance strategies.  Prior to hospitalization, patient was  independent  with mobility and lived with Family in a Mobile home home.  Home access is 3Stairs to enter.  Patient will benefit from skilled  PT intervention to maximize safe functional mobility, minimize fall risk and decrease caregiver burden for planned discharge with 24/7 assist available if needed..  Anticipate patient will benefit from follow up [place] at discharge.  PT - End of Session Activity Tolerance: Tolerates 30+ min activity with multiple rests PT Assessment Rehab Potential (ACUTE/IP ONLY): Good PT Barriers to Discharge: Medication compliance PT Patient demonstrates impairments in the following area(s): Balance;Endurance;Motor;Sensory;Safety PT Transfers Functional Problem(s): Bed Mobility;Bed to Chair;Car;Furniture;Floor PT Locomotion Functional Problem(s): Ambulation;Stairs PT Plan PT Intensity: Minimum of 1-2 x/day ,45 to 90 minutes PT Frequency: 5 out of 7 days PT Duration Estimated Length of Stay: 10-12 days PT Treatment/Interventions: Ambulation/gait training;Community reintegration;DME/adaptive equipment instruction;Neuromuscular re-education;Psychosocial support;Stair training;UE/LE Strength taining/ROM;Balance/vestibular training;Discharge planning;Functional electrical stimulation;Therapeutic Activities;UE/LE Coordination activities;Functional mobility training;Patient/family education;Splinting/orthotics;Therapeutic Exercise PT Transfers Anticipated Outcome(s): mod I PT Locomotion Anticipated Outcome(s): mod I with LRAD PT Recommendation Follow Up Recommendations: Home health PT Patient destination: Home Equipment Recommended: To be determined  Skilled Therapeutic Intervention No c/o pain, but reporting CBG was in the 40s and he was awaiting a second tray of breakfast.  Missed first 20 minutes due to pt eating for blood sugar control and conversation with PA.  Session focus on initial PT evaluation, pt education on role of PT, goals, ELOS, and rehab process.  Transfers throughout with min assist and RW, min cues for hand placement.  Gait up 50' with RW and very slow gait speed, verbal cues for upright posture,  and occasional cues for R foot placement.  Pt returned to room in w/c at end of session, positioned upright with call bell in reach and needs met.   PT Evaluation Precautions/Restrictions Precautions Precautions: Fall Precaution Comments: RLE ataxia and hemiparesis Restrictions Weight Bearing Restrictions: No General PT Amount of Missed Time (min): 20 Minutes PT Missed Treatment Reason: Nursing care;Other (Comment)(eating 2nd breakfast 2/2 low BGS) Vital Signs Pain Pain Assessment Pain Scale: 0-10 Pain Score: 0-No pain Home Living/Prior Functioning Home Living Available Help at Discharge: Family;Available 24 hours/day Type of Home: Mobile home Home Access: Stairs to enter Entrance Stairs-Number of Steps: 3 Entrance Stairs-Rails: Can reach both Home Layout: One level  Lives With: Family Prior Function Level of Independence: Independent with gait;Independent with transfers  Able to Take Stairs?: Yes Driving: Yes Vocation: On disability Vision/Perception  Perception Perception: Within Functional Limits Praxis Praxis: Intact  Cognition Overall Cognitive Status: Within Functional Limits for tasks assessed Arousal/Alertness: Awake/alert Orientation Level: Oriented X4 Memory: Appears intact Awareness: Appears intact Problem Solving: Appears intact Safety/Judgment: Appears intact Sensation Sensation Light Touch: Impaired Detail Light Touch Impaired Details: Impaired RLE Additional Comments: decreased sensation to LT on RLE, absent on bottom on R foot Coordination Gross Motor Movements are Fluid and Coordinated: No Fine Motor Movements are Fluid and Coordinated: No Coordination and Movement Description: RLE ataxia Heel Shin Test: R=L Motor  Motor Motor: Ataxia  Mobility Bed Mobility Bed Mobility: Supine to Sit Supine to Sit: Supervision/Verbal cueing Transfers Transfers: Transfer Sit to Stand: Minimal Assistance - Patient > 75% Stand to Sit: Minimal Assistance -  Patient > 75% Transfer (Assistive device): Rolling walker Locomotion  Gait Ambulation: Yes Gait Assistance: Contact Guard/Touching assist Ambulation Distance (Feet): 50 Feet Assistive device: Rolling walker Ambulation/Gait Assistance Details: Verbal cues for technique;Verbal cues for gait pattern;Verbal cues for safe use of DME/AE;Verbal cues for precautions/safety;Verbal cues for  sequencing;Tactile cues for posture Gait Gait: Yes Gait Pattern: Ataxic Stairs / Additional Locomotion Stairs: No Wheelchair Mobility Wheelchair Mobility: No  Trunk/Postural Assessment  Cervical Assessment Cervical Assessment: Within Functional Limits Thoracic Assessment Thoracic Assessment: Within Functional Limits Lumbar Assessment Lumbar Assessment: Within Functional Limits Postural Control Postural Control: Within Functional Limits  Balance Balance Balance Assessed: Yes Static Sitting Balance Static Sitting - Balance Support: Feet supported;No upper extremity supported Static Sitting - Level of Assistance: 6: Modified independent (Device/Increase time) Static Standing Balance Static Standing - Balance Support: Bilateral upper extremity supported;During functional activity Static Standing - Level of Assistance: 4: Min assist Dynamic Standing Balance Dynamic Standing - Balance Support: Bilateral upper extremity supported;During functional activity Dynamic Standing - Level of Assistance: 4: Min assist Extremity Assessment      RLE Assessment RLE Assessment: Exceptions to [facility] General Strength Comments: generalized weakness and decreased control, but WFL for mobility observed LLE Assessment LLE Assessment: Exceptions to [facility] General Strength Comments: generalized weakness, but WFL for mobility observed   See Function Navigator for Current Functional Status.   Refer to Care Plan for Long Term Goals  Recommendations for other services: None   Discharge Criteria: Patient will be discharged  from PT if patient refuses treatment 3 consecutive times without medical reason, if treatment goals not met, if there is a change in medical status, if patient makes no progress towards goals or if patient is discharged from hospital.  The above assessment, treatment plan, treatment alternatives and goals were discussed and mutually agreed upon: by patient  Michel Santee 02/23/2018, 12:07 PM

## 2018-02-23 NOTE — Progress Notes (Signed)
Pt CBG 556 before eating lunch. Asymptomatic. Notified Pam, NP. Order to give scheduled SSI and Novolog. Recheck CBG in 1 hr, 2 hr, and 3hr. Will notify of results. Pamella Pert, LPN

## 2018-02-23 NOTE — Progress Notes (Signed)
Physical Therapy Session Note  Patient Details  Name: Nathaniel [name] MRN: [id number] Date of Birth: 1985-11-09  Today's Date: 02/23/2018 PT Individual Time: 1030-1130 PT Individual Time Calculation (min): 60 min   Short Term Goals: Week 1:  PT Short Term Goal 1 (Week 1): Pt will transfer with supervision.  PT Short Term Goal 2 (Week 1): Pt will ambulate 150' with supervision and LRAD.  PT Short Term Goal 3 (Week 1): Pt will negotiate 8 steps with supervision. PT Short Term Goal 4 (Week 1): Pt will complete formal balance outcome measure.   Skilled Therapeutic Interventions/Progress Updates:  Pt received seated in w/c in room, agreeable to PT. No complaints of pain. Car transfer with mod A to stand from low car height to RW, v/c for safe transfer technique. Ascend/descend 4 stairs with 2 handrails and mod A for balance and to advance RLE when descending, R hip hiking noted when ascending stairs. Ambulation x 50 ft with RW and min A, DF assist wrap to RLE. Pt exhibits poor control of RLE with gait and needs increased time to place foot safely during gait. Pt also exhibits decreased B step length and gait speed. Pt left seated in w/c in room with needs in reach.  Therapy Documentation Precautions:  Precautions Precautions: Fall Precaution Comments: RLE ataxia and hemiparesis Restrictions Weight Bearing Restrictions: No General: PT Amount of Missed Time (min): 20 Minutes PT Missed Treatment Reason: Nursing care;Other (Comment)(eating 2nd breakfast 2/2 low BGS)  See Function Navigator for Current Functional Status.   Therapy/Group: Individual Therapy  Excell Seltzer, PT, DPT  02/23/2018, 3:37 PM

## 2018-02-23 NOTE — Progress Notes (Addendum)
Levemir adjusted to 6 units bid per Ms. Byrd's recommendations. Patient ate 2 breakfasts this am which caused BS to rise to almost 600. Current SSI with 4 units has been effective and BS dropping by 100 points every hour.   RN brought to attention that patient to start PCN G in 50 ml dextrose every 4 hours--contacted pharmacy to see if alternative IVF could be substituted. Contacted diabetic coordinator for ideas if pharmacy not able to do this ---she recommended checking BS every 4 hours with SSI if needed.

## 2018-02-23 NOTE — Evaluation (Signed)
Occupational Therapy Assessment and Plan  Patient Details  Name: Nathaniel [name] MRN: [id number] Date of Birth: December 05, 1985  OT Diagnosis: ataxia, hemiplegia affecting dominant side and muscle weakness (generalized) Rehab Potential: Rehab Potential (ACUTE ONLY): Excellent ELOS: 10-12 days   Today's Date: 02/23/2018 OT Individual Time: 1300-1400 OT Individual Time Calculation (min): 60 min     Problem List:  Patient Active Problem List   Diagnosis Date Noted  . Acute ischemic left MCA stroke ([place]) 02/22/2018  . Abnormality of gait following cerebrovascular accident (CVA)   . 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 Essential hypertension   . Dyslipidemia   . Syphilis   . DKA (diabetic ketoacidoses) ([place]) 02/16/2018  . CVA (cerebral vascular accident) ([place]) 02/16/2018  . Acute renal failure (ARF) ([place]) 10/21/2016  . Diabetic ketoacidosis without coma associated with type 1 diabetes mellitus ([place])   . Hyperbilirubinemia   . Hyponatremia 04/15/2016  . DM (diabetes mellitus) type 1, uncontrolled, with ketoacidosis ([place]) 04/15/2016  . Hyperglycemia 04/15/2016  . AKI (acute kidney injury) ([place]) 01/04/2016  . Malignant hypertension 01/04/2016  . Noncompliance with medications 01/04/2016  . Intractable nausea and vomiting 04/01/2015  . Gastroparesis 04/01/2015  . Type 1 diabetes mellitus with complication ([place]) 53/97/6734  . Chronic hypertension   . Nausea with vomiting   . Abscess, gluteal, right 06/21/2014  . Nausea and vomiting 04/08/2013  . Hyperglycemia without ketosis 04/08/2013  . Essential hypertension, benign 04/08/2013  . Hypercalcemia 07/29/2011  . Vomiting 07/28/2011  . Leukocytosis 07/28/2011  . Hypokalemia 07/28/2011  . DKA, type 1 ([place]) 07/27/2011  . Dehydration 07/27/2011  . Tobacco abuse 07/27/2011  . Marijuana abuse 07/27/2011    Past Medical History:  Past Medical History:  Diagnosis Date  . Diabetes mellitus     lantus/novolog  . Hyperlipidemia   . Hypertension   . Marijuana abuse    occaisionally  . Noncompliance with medication regimen   . Tobacco abuse    5/day   Past Surgical History:  Past Surgical History:  Procedure Laterality Date  . EYE SURGERY      Assessment & Plan Clinical Impression: Nathaniel [name] is a 32 year old male with history of T1DM poorly controlled with multiple admissions for DKA, HTN, non-compliance who was admitted on 02/17/2108 with progressive right sided numbness and weakness X  2 days. He was found to have DKA with BS 840, anion gap 19 and acute on chronic renal failure with BUN/SCr/K - 32/2.20/5.5.  History taken from chart review. He was started on DKA protocol and fluid for hydration. CT head reviewed, showing left internal capsule infarct. MRI brain done revealing acute lacunar infarct of left lateral thalamus/internal capsule, additional small acute lacunar infarcts in subcortical  left frontal lobe, subacute infarct in right superior cerebellum and multiple chronic lacunae in cerebellum and pons. MRA brain showed atherosclerotic changes without significant stenosis. Carotid dopplers without significant ICA stenosis. 2 D echo showed EF 55- 60% with no wall abnormality.   HIV NR. 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 Diabetic coordinator following of input. He continues to be limited by right sided weakness and sensory deficits. CIR recommended due to functional decline.  Patient transferred to CIR on 02/22/2018 .    Patient currently requires mod with basic self-care skills secondary to muscle weakness, decreased cardiorespiratoy endurance, ataxia and decreased coordination and decreased standing balance, decreased  postural control, hemiplegia and decreased balance strategies.  Prior to hospitalization, patient could complete ADLs/IADLs with independent .  Patient will benefit from  skilled intervention to decrease level of assist with basic self-care skills, increase independence with basic self-care skills and increase level of independence with iADL prior to discharge home with care partner.  Anticipate patient will require intermittent supervision and follow up home health.  OT - End of Session Activity Tolerance: Tolerates 10 - 20 min activity with multiple rests Endurance Deficit: Yes OT Assessment Rehab Potential (ACUTE ONLY): Excellent OT Patient demonstrates impairments in the following area(s): Balance;Perception;Safety;Sensory;Endurance;Motor OT Basic ADL's Functional Problem(s): Grooming;Bathing;Dressing;Toileting OT Advanced ADL's Functional Problem(s): Simple Meal Preparation;Laundry OT Transfers Functional Problem(s): Toilet;Tub/Shower OT Additional Impairment(s): Fuctional Use of Upper Extremity OT Plan OT Intensity: Minimum of 1-2 x/day, 45 to 90 minutes OT Frequency: 5 out of 7 days OT Duration/Estimated Length of Stay: 10-12 days OT Treatment/Interventions: Balance/vestibular training;Discharge planning;Functional electrical stimulation;Pain management;Self Care/advanced ADL retraining;Therapeutic Activities;UE/LE Coordination activities;Therapeutic Exercise;Patient/family education;Functional mobility training;Disease mangement/prevention;Neuromuscular re-education;Psychosocial support;UE/LE Strength taining/ROM;DME/adaptive equipment instruction;Community reintegration OT Self Feeding Anticipated Outcome(s): Mod I OT Basic Self-Care Anticipated Outcome(s): Mod I OT Toileting Anticipated Outcome(s): Mod I OT Bathroom Transfers Anticipated Outcome(s): Supervision OT Recommendation Recommendations for Other Services: Therapeutic Recreation consult Therapeutic Recreation Interventions: Pet therapy;Kitchen group;Stress management;Outing/community reintergration Patient destination: Home Follow Up Recommendations: Home health OT Equipment Recommended: To  be determined   Skilled Therapeutic Intervention Pt seen for OT eval and ADL bathing/dressing session. Pt sitting up in w/c upon arrival with NT present getting BS, 519- RN made aware and alerted PA, cleared to cont with therapy as pt able. Pt ambulated throughout room with RW and min progressing to mod A with fatigue and very slow rate of ambulation speed. He required mod A for functional transfers and assist for controlled descent. He required heavy steadying assist while completing hygiene following toileting task.   HE bathed seated on tub bench, lateral leans for buttock hygiene. Able to use R UE throughout functional task at diminished level with several instances of dropping items.  He returned to w/c to dress, standing with mod steadying assist and use of RW to pull pants up, maintaining one UE support on RW at all times. Pt left seated in w/c at end of session, all needs in reach. Education provided throughout session regarding role of OT, POC, OT/PT goals, CVA recovery, continuum of care, and d/c planning.   OT Evaluation Precautions/Restrictions  Precautions Precautions: Fall Precaution Comments: RLE ataxia and hemiparesis Restrictions Weight Bearing Restrictions: No General Chart Reviewed: Yes Pain   No/denies pain Home Living/Prior Functioning Home Living Family/patient expects to be discharged to:: Private residence Living Arrangements: Parent Available Help at Discharge: Family, Available 24 hours/day Type of Home: Mobile home Home Access: Stairs to enter [employer] of Steps: 3 Entrance Stairs-Rails: Can reach both Home Layout: One level Bathroom Shower/Tub: Optometrist: Yes  Lives With: Family IADL History Homemaking Responsibilities: Yes IADL Comments: Babysits his brother's kids often. Enjoys outdoor activities Prior Function Level of Independence: Independent with gait, Independent with  transfers, Independent with basic ADLs, Independent with homemaking with ambulation  Able to Take Stairs?: Yes Driving: Yes Vocation: On disability Vision Baseline Vision/History: Wears glasses Wears Glasses: Reading only Patient Visual Report: No change from baseline Vision Assessment?: No apparent visual deficits Perception  Perception: Within Functional Limits Praxis Praxis: Intact Cognition Overall Cognitive Status: Within Functional Limits for tasks assessed Arousal/Alertness: Awake/alert Orientation Level: Person;Place;Situation  Person: Oriented Place: Oriented Situation: Oriented Year: 2019 Month: June Day of Week: Correct Memory: Appears intact Immediate Memory Recall: Sock;Bed;Blue Memory Recall: Sock;Blue;Bed Memory Recall Sock: Without Cue Memory Recall Blue: Without Cue Memory Recall Bed: Without Cue Awareness: Appears intact Problem Solving: Appears intact Safety/Judgment: Appears intact Sensation Sensation Light Touch: Impaired Detail Light Touch Impaired Details: Impaired RLE;Impaired RUE Proprioception: Appears Intact Additional Comments: Reports "tingling/numbness" in R shoulder to hand as well as R LE Coordination Gross Motor Movements are Fluid and Coordinated: No Fine Motor Movements are Fluid and Coordinated: No Coordination and Movement Description: R hemeperesis and ataxia Finger Nose Finger Test: Decreased speed and accuracy R UE Motor  Motor Motor: Ataxia;Hemiplegia Motor - Skilled Clinical Observations: R hemepersis and ataxia Mobility  Bed Mobility Bed Mobility: Supine to Sit Supine to Sit: Supervision/Verbal cueing Transfers Sit to Stand: Minimal Assistance - Patient > 75% Stand to Sit: Minimal Assistance - Patient > 75%  Trunk/Postural Assessment  Cervical Assessment Cervical Assessment: Within Functional Limits Thoracic Assessment Thoracic Assessment: Within Functional Limits Lumbar Assessment Lumbar Assessment: Within  Functional Limits Postural Control Postural Control: Within Functional Limits  Balance Balance Balance Assessed: Yes Static Sitting Balance Static Sitting - Balance Support: Feet supported;No upper extremity supported Static Sitting - Level of Assistance: 6: Modified independent (Device/Increase time) Dynamic Sitting Balance Dynamic Sitting - Balance Support: During functional activity;Feet supported Dynamic Sitting - Level of Assistance: 5: Stand by assistance Sitting balance - Comments: Sitting during bathing tasks Static Standing Balance Static Standing - Balance Support: During functional activity;Right upper extremity supported;Left upper extremity supported Static Standing - Level of Assistance: 4: Min assist Dynamic Standing Balance Dynamic Standing - Balance Support: During functional activity;Right upper extremity supported;Left upper extremity supported Dynamic Standing - Level of Assistance: 4: Min assist;3: Mod assist Dynamic Standing - Comments: Standing during LB dressing tasks Extremity/Trunk Assessment RUE Assessment RUE Assessment: Exceptions to [facility] Active Range of Motion (AROM) Comments: WFL General Strength Comments: 4/5 throughout RUE Body System: Neuro LUE Assessment LUE Assessment: Within Functional Limits   See Function Navigator for Current Functional Status.   Refer to Care Plan for Long Term Goals  Recommendations for other services: Neuropsych and Therapeutic Recreation  Pet therapy, Kitchen group, Stress management and Outing/community reintegration   Discharge Criteria: Patient will be discharged from OT if patient refuses treatment 3 consecutive times without medical reason, if treatment goals not met, if there is a change in medical status, if patient makes no progress towards goals or if patient is discharged from hospital.  The above assessment, treatment plan, treatment alternatives and goals were discussed and mutually agreed upon: by  patient  Mersades Barbaro L 02/23/2018, 3:53 PM

## 2018-02-23 NOTE — Progress Notes (Signed)
Discussed results of RPR with patient. He was not aware of results and denies hx of STDs, prior treatments or infections. He is not aware of any prior partners with hx of STDs.   Per discussion with Dr. Erlinda Hong by Dr. Naaman Plummer and self--will contact ID for input on treatment, further work up, need for LP?Nathaniel [name]

## 2018-02-23 NOTE — Progress Notes (Addendum)
Inpatient Diabetes Program Recommendations  AACE/ADA: New Consensus Statement on Inpatient Glycemic Control (2019)  Target Ranges:  Prepandial:   less than 140 mg/dL      Peak postprandial:   less than 180 mg/dL (1-2 hours)      Critically ill patients:  140 - 180 mg/dL  Results for GARNER, DULLEA (MRN [id number]) as of 02/23/2018 14:29  Ref. Range 02/23/2018 12:00 02/23/2018 13:07 02/23/2018 14:25  Glucose-Capillary Latest Ref Range: 65 - 99 mg/dL 566 (HH) 594 (HH) 431 (H)   Results for JAMARRION, BUDAI (MRN [id number]) as of 02/23/2018 07:28  Ref. Range 02/22/2018 08:04 02/22/2018 08:43 02/22/2018 11:33 02/22/2018 17:43 02/22/2018 21:24 02/22/2018 22:19 02/23/2018 06:47 02/23/2018 07:04 02/23/2018 07:25  Glucose-Capillary Latest Ref Range: 65 - 99 mg/dL 41 (LL) 110 (H) 380 (H) 133 (H) 58 (L) 92 44 (LL) 51 (L) 119 (H)   Review of Glycemic Control  Diabetes history:DM1 (makes no insulin; requires basal, correction, and meal coverage insulin) Outpatient Diabetes medications:Levemir50 units BID Current orders for Inpatient glycemic control:Levemir16 unitsdaily, Novolog 0-9units TID with meals, Novolog 0-5 units QHS, Novolog 6 units TID with meals for meal coverage   Inpatient Diabetes Program Recommendations: Insulin-Basal: Noted Levemir was decreased to 12 units daily and Levemir 6 units was given today at 9:11 am today. Therefore, recommend discontinuing Levemir 12 units daily and Levemir 6 units daily and place a new order for Levemir 6 units BID (10 am and 10 pm).   NOTE: Noted consult for Diabetes Coordinator. Inpatient Diabetes Coordinator spoke with patient on 02/20/2018 regarding A1C and control. Patient has DM1 and therefore makes no insulin. Patient will require insulin for basal, correction, and meal coverage and is very sensitive to insulin. Prior to hospitalization patient was taking large doses of basal insulin which he was using to cover all insulin needs and having frequent hypoglycemia and  was not monitoring glucose at all.  Will continue to follow daily and make further recommendations if needed based on glycemic trends. Once patient is ready to be discharged, strongly recommend patient be discharged on basal/bolus regimen similar to inpatient insulin orders if glucose is fairly well controlled  Addendum 02/23/18@14 :25-Spoke with patient again regarding DM and inpatient glycemic control. Patient denies having any symptoms of hypoglycemia since he was admitted.  Patient reports that last night when his glucose was 58 mg/dl at bedtime he was given graham crackers, peanut butter, and a regular coke. Patient's glucose was 44 mg/dl this morning and he reports he was again given snacks, breakfast, and extra breakfast food. Anticipate glucose went up to 566 mg/dl at 12:00 due to receiving so many carbohydrates for hypoglycemia and for breakfast and did not receive any Novolog to cover any carbohydrates consumed. Discussed current insulin regimen with patient and informed patient of recommendation to change Levemir to 6 units BID. Patient verbalized understanding of information discussed and agreeable to recommendations being made. When treating hypoglycemia, it should be treated using the rule of 15 (15 grams of carbohydrates and what 15 mins and recheck). If glucose is still low after treatment of hypoglycemia then it would be retreated again with 15 grams of carbs. 15 grams of carbs will typically bring up glucose about 50 mg/dl. Discussed recommendations with RN and plan to call Pam, PA to ask about changing Levemir.  Thanks, Barnie Alderman, RN, MSN, CDE Diabetes Coordinator Inpatient Diabetes Program [phone] (Team Pager from 8am to 5pm)

## 2018-02-23 NOTE — Progress Notes (Signed)
Occupational Therapy Session Note  Patient Details  Name: GYAN CAMBRE MRN: [id number] Date of Birth: December 09, 1985  Today's Date: 02/23/2018 OT Individual Time: 6578-4696 OT Individual Time Calculation (min): 30 min    Skilled Therapeutic Interventions/Progress Updates:    Focus on sit to stands and controlled stands to sit with and without UE support; requiring min A / contact guard. Pt able to propel with bilateral LEs with extra time to address strengthening and coordination. Engaged in [facility] task focusing grasp and coordination with large peg board with eyes closed and open.  Able to complete both ways with extra time.   Pt able to ambulate from dayroom back to room with min A with RW at a very slow pace with increased focused on his movements.  Left with nursing at end of session.    Therapy Documentation Precautions:  Precautions Precautions: Fall Precaution Comments: RLE ataxia and hemiparesis Restrictions Weight Bearing Restrictions: No   Pain: Pain Assessment Pain Scale: 0-10  See Function Navigator for Current Functional Status.   Therapy/Group: Individual Therapy  Willeen Cass [facility] 02/23/2018, 3:20 PM

## 2018-02-23 NOTE — Consult Note (Signed)
[facility] for Infectious Disease    Date of Admission:  02/22/2018                   Reason for Consult: Syphilis / Possible Neurosyphilis Referring Provider: Naaman Plummer Primary Care Provider: [name], MD   Assessment/Plan:  [name] is a 32 y/o male with previous history of Type 1 diabetes, hypertension and hyperlipidemia who was admitted to [facility] with right sided numbness and weakness and found to have a lacunar stroke as well as multiple chronic lacunar strokes. 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 He is currently on Plavix and aspirin for stroke prevention making an LP challenging. Thus it appears to be best to treat empirically for late latent syphilis as he has no current symptoms and for neurosyphilis given his recent stroke.   1. Start Penicillin 3 million units q 4 IV for 10 days. 2. Start Bicillin injections 2.4 million units weekly for 3 weeks.  3. Encouraged safe sex practices with condom usage.  4. Follow up with primary care following completion in about 3-6 months post treatment to recheck RPR titer.    Active Problems:   Acute ischemic left MCA stroke ([place])   . aspirin  300 mg Rectal Daily   Or  . aspirin  162 mg Oral Daily  . atorvastatin  20 mg Oral q1800  . enoxaparin (LOVENOX) injection  40 mg Subcutaneous Q24H  . insulin aspart  0-5 Units Subcutaneous QHS  . insulin aspart  0-9 Units Subcutaneous TID WC  . insulin aspart  4 Units Subcutaneous TID WC  . insulin detemir  12 Units Subcutaneous Q supper  . insulin detemir  6 Units Subcutaneous Daily  . lisinopril  20 mg Oral Daily     HPI: Nathaniel [name] is a 32 y.o. male with previous medical history of marijuana use, hypertension, hyperlipidemia and diabetes was evaluated in the ED and  admitted to the hospital with 2 days of right sided numbness and some weakness of the right arm and leg. CT with new left internal capsule white matter changes seen with chronic small vessel disease. MRI showed acute lacunar infarct of the left lateral thalamus/internal capsule as well as multiple underlying chronic lacunar infarcts in the cerebellum. He was also noted to be in DKA. He was outside the window for thromolytic or endovascular therapy. 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 ID has been consulted for treatment recommendations as this is a new diagnosis for the patient. He is currently sexually active and has been with 2 partners recently that he has had unprotected intercourse with. No previous history of STD that he is aware of and currently is asymptomatic other than his stroke. He has never been tested for STD in the past. HIV on admission was negative.    Review of Systems: Review of Systems  Constitutional: Negative for chills, diaphoresis, fever and weight loss.  Respiratory: Negative for cough, shortness of breath and wheezing.   Cardiovascular: Negative for chest pain and leg swelling.  Gastrointestinal: Negative for diarrhea, nausea and vomiting.  Skin: Negative for rash.  Neurological: Positive for focal weakness. Negative for dizziness and headaches.     Past Medical History:  Diagnosis Date  .  Diabetes mellitus    lantus/novolog  . Hyperlipidemia   . Hypertension   . Marijuana abuse    occaisionally  . Noncompliance with medication regimen   . Tobacco abuse    5/day    Social History   Tobacco Use  . Smoking status: Current Every Day Smoker    Packs/day: 0.25    Years: 8.00    Pack years: 2.00    Types: Cigarettes  . Smokeless tobacco: Never Used  Substance Use Topics  . Alcohol use: No  . Drug use: Yes    Types: Marijuana    Comment: last use yesterday    Family History  Problem Relation Age of Onset  . Diabetes  Father   . Kidney failure Father        last 4 mnths of life  . Hypertension Mother     Allergies  Allergen Reactions  . Orange     Increases blood sugar immediately     OBJECTIVE: Blood pressure (!) 147/95, pulse 87, temperature 98.5 F (36.9 C), temperature source Oral, resp. rate 18, height 6\' 2"  (1.88 m), weight 159 lb 4.8 oz (72.3 kg), SpO2 100 %.  Physical Exam  Constitutional: He is oriented to person, place, and time. He appears well-developed and well-nourished. No distress.  Cardiovascular: Normal rate, regular rhythm, normal heart sounds and intact distal pulses. Exam reveals no gallop and no friction rub.  No murmur heard. Pulmonary/Chest: Effort normal and breath sounds normal. No stridor. No respiratory distress. He has no wheezes. He has no rales. He exhibits no tenderness.  Neurological: He is alert and oriented to person, place, and time.  Decreased sensation and strength in right upper and lower extremity.   Skin: Skin is warm and dry. No rash noted.  Psychiatric: He has a normal mood and affect. His behavior is normal. Judgment and thought content normal.    Lab Results Lab Results  Component Value Date   WBC 7.7 02/23/2018   HGB 11.9 (L) 02/23/2018   HCT 36.7 (L) 02/23/2018   MCV 88.2 02/23/2018   PLT 240 02/23/2018    Lab Results  Component Value Date   CREATININE 1.66 (H) 02/23/2018   BUN 15 02/23/2018   NA 143 02/23/2018   K 4.0 02/23/2018   CL 106 02/23/2018   CO2 28 02/23/2018    Lab Results  Component Value Date   ALT 21 02/23/2018   AST 23 02/23/2018   ALKPHOS 76 02/23/2018   BILITOT 0.8 02/23/2018     Microbiology: No results found for this or any previous visit (from the past 240 hour(s)).   Terri Piedra, NP [facility] [phone] Pager  02/23/2018  10:26 AM

## 2018-02-23 NOTE — Consult Note (Addendum)
Stroke Neurology Consultation Note  Consult Requested by: Dr Naaman Plummer  Reason for Consult: stroke  Consult Date: 02/23/18  The history was obtained from the pt.  During history and examination, all items were able to obtain unless otherwise noted.  History of Present Illness:  Nathaniel [name] is a 31 y.o. African American male with PMH of uncontrolled type I DM, recurrent DKAs, HTN, HLD, and current smoker admitted to [facility] on 02/16/18 for 2 day hx of right UE tingling/numbness, right LE weakness and gait instability. His glucose was 840 on presentation and was in DKA. MRI showed acute and subacute left inferior frontal, left thalamus/PLIC infarcts, right SCA and left PICA small/punctate infarcts, embolic pattern, but also considered could be small vessel lacunar strokes. Also chronic infarcts at right pons and left cerebellar. MRA b/l moderate to severe stenosis of b/l ICA siphons. CUS unremarkable and EF 55-60%. LDL 201 and A1C 10.0. His RPR was 1:2 positive and confirmation test was positive, however, he denies any STD hx and was not treated for such in the past. UDS positive for THC.  He was put on ASA 162 and lipitor 20 on discharge to CIR at [facility]. Over the course in CIR, his glucose still not in control, fluctuating from hypoglycemia and hyperglycemia. Neurology was consulted for further evaluation of stroke.    Past Medical History:  Diagnosis Date  . Diabetes mellitus    lantus/novolog  . Hyperlipidemia   . Hypertension   . Marijuana abuse    occaisionally  . Noncompliance with medication regimen   . Tobacco abuse    5/day    Past Surgical History:  Procedure Laterality Date  . EYE SURGERY      Family History  Problem Relation Age of Onset  . Diabetes Father   . Kidney failure Father        last 4 mnths of life  . Hypertension Mother     Social History:  reports that he has been smoking cigarettes.  He has a 2.00 pack-year smoking history. He has never used smokeless  tobacco. He reports that he has current or past drug history. Drug: Marijuana. He reports that he does not drink alcohol.  Allergies:  Allergies  Allergen Reactions  . Orange     Increases blood sugar immediately     No current facility-administered medications on file prior to encounter.    Current Outpatient Medications on File Prior to Encounter  Medication Sig Dispense Refill  . amLODipine (NORVASC) 5 MG tablet Take 1 tablet (5 mg total) by mouth daily. 30 tablet 3  . aspirin 81 MG tablet Take 2 tablets (162 mg total) by mouth daily. 30 tablet 12  . atorvastatin (LIPITOR) 20 MG tablet Take 1 tablet (20 mg total) by mouth daily at 6 PM. 30 tablet 6  . insulin aspart (NOVOLOG) 100 UNIT/ML injection Inject 6 Units into the skin 3 (three) times daily with meals. 10 mL 11  . insulin detemir (LEVEMIR) 100 UNIT/ML injection Inject 0.21 mLs (21 Units total) into the skin daily with supper. 10 mL 11  . lisinopril (PRINIVIL,ZESTRIL) 20 MG tablet Take 1 tablet (20 mg total) by mouth daily. 30 tablet 5    Review of Systems: A full ROS was attempted today and was able to be performed.  Systems assessed include - Constitutional, Eyes,  HENT, Respiratory, Cardiovascular, Gastrointestinal, Genitourinary, Integument/breast, Hematologic/lymphatic, Musculoskeletal, Neurological, Behavioral/Psych, Endocrine, Allergic/Immunologic - with pertinent responses as per HPI.  Physical Examination: Temp:  [98.4 F (  36.9 C)-98.5 F (36.9 C)] 98.5 F (36.9 C) (06/06 0423) Pulse Rate:  [79-98] 87 (06/06 0423) Resp:  [17-18] 18 (06/06 0423) BP: (123-165)/(79-95) 147/95 (06/06 0423) SpO2:  [98 %-100 %] 100 % (06/06 0423) Weight:  [159 lb 4.8 oz (72.3 kg)] 159 lb 4.8 oz (72.3 kg) (06/05 1400)  General - well nourished, well developed, in no apparent distress.    Ophthalmologic - fundi not visualized due to noncooperation.    Cardiovascular - regular rate and rhythm  Mental Status -  Level of arousal and  orientation to time, place, and person were intact. Language including expression, naming, repetition, comprehension was assessed and found intact. Fund of Knowledge was assessed and was intact.  Cranial Nerves II - XII - II - Vision intact OU. III, IV, VI - Extraocular movements intact. V - Facial sensation intact bilaterally. VII - Facial movement intact bilaterally. VIII - Hearing & vestibular intact bilaterally. X - Palate elevates symmetrically. XI - Chin turning & shoulder shrug intact bilaterally. XII - Tongue protrusion intact.  Motor Strength - The patient's strength was normal in all extremities except right hand mild dexterity difficulty and right LE 4/5 and pronator drift was absent.   Motor Tone & Bulk - Muscle tone was assessed at the neck and appendages and was normal.  Bulk was normal and fasciculations were absent.   Reflexes - The patient's reflexes were normal in all extremities and he had no pathological reflexes.  Sensory - Light touch, temperature/pinprick were assessed and were normal, however, pt felt left UE tingling.    Coordination - The patient had mild dysmetria at LUE and ataxia at LLE, slight out of proportional to the weakness.  Tremor was absent.  Gait and Station - deferred  Data Reviewed: I have personally reviewed the radiological images below and agree with the radiology interpretations.  Dg Chest 2 View  Result Date: 02/16/2018 CLINICAL DATA:  Stroke.  No chest pain. EXAM: CHEST - 2 VIEW COMPARISON:  July 19, 2016 FINDINGS: The heart size and mediastinal contours are within normal limits. Both lungs are clear. The visualized skeletal structures are unremarkable. IMPRESSION: No active cardiopulmonary disease. Electronically Signed   By: Dorise Bullion III M.D   On: 02/16/2018 22:48   Ct Head Wo Contrast  Result Date: 02/16/2018 CLINICAL DATA:  Progressively worsening right hand numbness since yesterday. History of diabetes. EXAM: CT HEAD  WITHOUT CONTRAST TECHNIQUE: Contiguous axial images were obtained from the base of the skull through the vertex without intravenous contrast. COMPARISON:  MRI of the head January 09, 2014 FINDINGS: BRAIN: No intraparenchymal hemorrhage, mass effect nor midline shift. The ventricles and sulci are normal. No acute large vascular territory infarcts. No abnormal extra-axial fluid collections. New patchy hypodensity LEFT posterior limb of the internal capsule. Basal cisterns are patent. VASCULAR: Mild calcific atherosclerosis carotid siphons. SKULL/SOFT TISSUES: No skull fracture. No significant soft tissue swelling. ORBITS/SINUSES: The included ocular globes and orbital contents are normal.LEFT middle ear and mastoid effusion. Status post bilateral ocular lens implants. OTHER: None. IMPRESSION: 1. New LEFT internal capsule white matter changes seen with chronic small vessel ischemic disease though, given RIGHT-sided numbness this may be acute. 2. LEFT middle ear and mastoid effusion. 3. Mild atherosclerosis. Electronically Signed   By: Elon Alas M.D.   On: 02/16/2018 19:57   [name] OA Contrast  Result Date: 02/20/2018 CLINICAL DATA:  Acute right cerebellar, left thalamic, and left frontal lobe infarcts. Abnormal MRA complicated by motion artifact.  Follow-up MRA. EXAM: MRA HEAD WITHOUT CONTRAST TECHNIQUE: Angiographic images of the Circle of Willis were obtained using MRA technique without intravenous contrast. COMPARISON:  MRI and MRA head 02/17/2018 FINDINGS: Atherosclerotic irregularity is present at the cavernous and precavernous internal carotid arteries bilaterally. There is less patient motion on today's study. No significant stenosis is present relative to the more distal vessels. The A1 and M1 segments are normal. No definite anterior communicating artery is present. MCA bifurcations are intact. ACA and MCA branch vessels are within normal limits. The left vertebral artery is the dominant vessel.  PICA origin is visualized and normal. Right AICA is dominant. The basilar artery is normal. Both posterior cerebral arteries originate from basilar tip. There is some attenuation of distal PCA branch vessels bilaterally without a significant proximal stenosis or occlusion. No aneurysms are present. IMPRESSION: 1. Atherosclerotic changes within the cavernous and precavernous internal carotid arteries bilaterally, left greater than right, without significant stenosis relative to the more distal vessels. 2. Mild distal small vessel disease most evident and posterior circulation without other significant proximal stenosis, aneurysm, or branch vessel occlusion. Electronically Signed   By: San Morelle M.D.   On: 02/20/2018 09:11   Mr Brain Wo Contrast  Result Date: 02/17/2018 CLINICAL DATA:  31 year old male with progressive right side numbness in the arm and leg since yesterday. Diabetes. EXAM: MRI HEAD WITHOUT CONTRAST MRA HEAD WITHOUT CONTRAST TECHNIQUE: Multiplanar, multiecho pulse sequences of the brain and surrounding structures were obtained without intravenous contrast. Angiographic images of the head were obtained using MRA technique without contrast. COMPARISON:  Head CT without contrast 02/16/2018. Cervical spine MRI 11/19/2015. Brain MRI 01/09/2014. FINDINGS: MRI HEAD FINDINGS Brain: Oval 17 millimeter focus of restricted diffusion in the lateral left thalamus and posterior limb left internal capsule as seen on series 5, image 49. Associated T2 and FLAIR hyperintensity. No hemorrhage or mass effect. There are also 2 smaller, up to 10 millimeters, subcortical white matter foci of restricted diffusion in the anterior left frontal lobe (series 3, image 82). Similar T2 and FLAIR hyperintensity with no hemorrhage or mass effect. Furthermore, there is a 12 millimeter area of abnormal trace diffusion in the right superior cerebellum (series 3, image 73) which appears T2 and FLAIR hyperintense but  isointense on ADC compatible with a small subacute infarct. No hemorrhage or mass effect. Multiple chronic small lacunar infarcts bilateral cerebellum and dorsal right pons are new since 2013. Outside of the acute findings the deep gray matter nuclei remain normal. Periventricular white matter T2 and FLAIR hyperintensity has progressed since 2013 outside of the acute areas, including the right periatrial white matter on series 13, image 25. No cerebral cortical encephalomalacia identified. No chronic cerebral blood products identified. No midline shift, mass effect, evidence of mass lesion, ventriculomegaly, extra-axial collection or acute intracranial hemorrhage. Cervicomedullary junction and pituitary are within normal limits. Vascular: Major intracranial vascular flow voids are stable since 2015. Skull and upper cervical spine: Negative visible cervical spine. Visualized bone marrow signal is within normal limits. Sinuses/Orbits: Stable orbits soft tissues with postoperative changes to both globes. Mild paranasal sinus mucosal thickening has regressed. Other: Chronic left mastoid effusion is stable to mildly improved. Trace right mastoid fluid is stable. Grossly normal visible internal auditory structures. Grossly negative scalp and face soft tissues. MRA HEAD FINDINGS Antegrade flow in the posterior circulation with codominant distal vertebral arteries. No stenosis. Normal left PICA origin. The right AICA may be dominant. Patent vertebrobasilar junction and basilar artery without stenosis. Patent  SCA and PCA origins. Both posterior communicating arteries are present. Bilateral PCA branches are within normal limits. Antegrade flow in both ICA siphons with atherosclerotic versus artifactual bilateral cavernous and proximal supraclinoid siphon irregularity and narrowing (series 103, image 7). Normal ophthalmic and posterior communicating artery origins. Normal appearance of the supraclinoid ICAs. Patent carotid  termini. Normal MCA and ACA origins. Anterior communicating artery and visible ACA branches are normal. The left MCA bifurcates early. The left M1, bifurcation, and visible left MCA branches are within normal limits. The right MCA M1, bifurcation, and visible right MCA branches are within normal limits. IMPRESSION: 1. Acute lacunar infarct of the Left lateral thalamus/internal capsule corresponding to the CT finding yesterday. No associated hemorrhage or mass effect. 2. Additional small acute lacunar infarcts in the anterior left frontal lobe subcortical white matter (Left MCA territory). Subacute infarct of the Right superior cerebellum (Right SCA territory). No hemorrhage or mass effect. 3. Multiple underlying chronic lacunar infarcts in the cerebellum and the right pons are new since 2015. 4. Intracranial MRA remarkable for moderate irregularity and stenosis of the bilateral cavernous ICAs. This is suspicious for advanced ICA atherosclerosis in this clinical setting, but might be exacerbated by artifact from hyperplastic sphenoid paranasal sinuses. Electronically Signed   By: Genevie Ann M.D.   On: 02/17/2018 07:30   US Carotid Bilateral (at [facility] Only)  Result Date: 02/17/2018 CLINICAL DATA:  Acute cerebral infarction. EXAM: BILATERAL CAROTID DUPLEX ULTRASOUND TECHNIQUE: Pearline Cables scale imaging, color Doppler and duplex ultrasound were performed of bilateral carotid and vertebral arteries in the neck. COMPARISON:  None. FINDINGS: Criteria: Quantification of carotid stenosis is based on velocity parameters that correlate the residual internal carotid diameter with NASCET-based stenosis levels, using the diameter of the distal internal carotid lumen as the denominator for stenosis measurement. The following velocity measurements were obtained: RIGHT ICA:  77/23 cm/sec CCA:  149/70 cm/sec SYSTOLIC ICA/CCA RATIO:  0.6 ECA:  217 cm/sec LEFT ICA:  80/23 cm/sec CCA:  263/78 cm/sec SYSTOLIC ICA/CCA RATIO:  0.7 ECA:   114 cm/sec RIGHT CAROTID ARTERY: No focal plaque identified. Normal velocities and waveforms. No evidence of right carotid stenosis. RIGHT VERTEBRAL ARTERY: Antegrade flow with normal waveform and velocity. LEFT CAROTID ARTERY: Trace plaque at the level of the left carotid bulb. No evidence of ICA plaque or stenosis. LEFT VERTEBRAL ARTERY: Antegrade flow with normal waveform and velocity. IMPRESSION: Minimal plaque at the level of the left carotid bulb. No evidence of bilateral ICA plaque or stenosis. Electronically Signed   By: Aletta Edouard M.D.   On: 02/17/2018 08:22   Mr Jodene Nam Head/brain HY Cm  Result Date: 02/17/2018 CLINICAL DATA:  32 year old male with progressive right side numbness in the arm and leg since yesterday. Diabetes. EXAM: MRI HEAD WITHOUT CONTRAST MRA HEAD WITHOUT CONTRAST TECHNIQUE: Multiplanar, multiecho pulse sequences of the brain and surrounding structures were obtained without intravenous contrast. Angiographic images of the head were obtained using MRA technique without contrast. COMPARISON:  Head CT without contrast 02/16/2018. Cervical spine MRI 11/19/2015. Brain MRI 01/09/2014. FINDINGS: MRI HEAD FINDINGS Brain: Oval 17 millimeter focus of restricted diffusion in the lateral left thalamus and posterior limb left internal capsule as seen on series 5, image 49. Associated T2 and FLAIR hyperintensity. No hemorrhage or mass effect. There are also 2 smaller, up to 10 millimeters, subcortical white matter foci of restricted diffusion in the anterior left frontal lobe (series 3, image 82). Similar T2 and FLAIR hyperintensity with no hemorrhage or mass  effect. Furthermore, there is a 12 millimeter area of abnormal trace diffusion in the right superior cerebellum (series 3, image 73) which appears T2 and FLAIR hyperintense but isointense on ADC compatible with a small subacute infarct. No hemorrhage or mass effect. Multiple chronic small lacunar infarcts bilateral cerebellum and dorsal right  pons are new since 2013. Outside of the acute findings the deep gray matter nuclei remain normal. Periventricular white matter T2 and FLAIR hyperintensity has progressed since 2013 outside of the acute areas, including the right periatrial white matter on series 13, image 25. No cerebral cortical encephalomalacia identified. No chronic cerebral blood products identified. No midline shift, mass effect, evidence of mass lesion, ventriculomegaly, extra-axial collection or acute intracranial hemorrhage. Cervicomedullary junction and pituitary are within normal limits. Vascular: Major intracranial vascular flow voids are stable since 2015. Skull and upper cervical spine: Negative visible cervical spine. Visualized bone marrow signal is within normal limits. Sinuses/Orbits: Stable orbits soft tissues with postoperative changes to both globes. Mild paranasal sinus mucosal thickening has regressed. Other: Chronic left mastoid effusion is stable to mildly improved. Trace right mastoid fluid is stable. Grossly normal visible internal auditory structures. Grossly negative scalp and face soft tissues. MRA HEAD FINDINGS Antegrade flow in the posterior circulation with codominant distal vertebral arteries. No stenosis. Normal left PICA origin. The right AICA may be dominant. Patent vertebrobasilar junction and basilar artery without stenosis. Patent SCA and PCA origins. Both posterior communicating arteries are present. Bilateral PCA branches are within normal limits. Antegrade flow in both ICA siphons with atherosclerotic versus artifactual bilateral cavernous and proximal supraclinoid siphon irregularity and narrowing (series 103, image 7). Normal ophthalmic and posterior communicating artery origins. Normal appearance of the supraclinoid ICAs. Patent carotid termini. Normal MCA and ACA origins. Anterior communicating artery and visible ACA branches are normal. The left MCA bifurcates early. The left M1, bifurcation, and  visible left MCA branches are within normal limits. The right MCA M1, bifurcation, and visible right MCA branches are within normal limits. IMPRESSION: 1. Acute lacunar infarct of the Left lateral thalamus/internal capsule corresponding to the CT finding yesterday. No associated hemorrhage or mass effect. 2. Additional small acute lacunar infarcts in the anterior left frontal lobe subcortical white matter (Left MCA territory). Subacute infarct of the Right superior cerebellum (Right SCA territory). No hemorrhage or mass effect. 3. Multiple underlying chronic lacunar infarcts in the cerebellum and the right pons are new since 2015. 4. Intracranial MRA remarkable for moderate irregularity and stenosis of the bilateral cavernous ICAs. This is suspicious for advanced ICA atherosclerosis in this clinical setting, but might be exacerbated by artifact from hyperplastic sphenoid paranasal sinuses. Electronically Signed   By: Genevie Ann M.D.   On: 02/17/2018 07:30   TTE - Left ventricle: The cavity size was normal. Wall thickness was   increased in a pattern of mild LVH. Systolic function was normal.   The estimated ejection fraction was in the range of 55% to 60%.   Wall motion was normal; there were no regional wall motion   abnormalities. Left ventricular diastolic function parameters   were normal for the patient&'s age. - Mitral valve: Mildly thickened leaflets. There was mild   regurgitation. - Right atrium: Central venous pressure (est): 3 mm Hg. - Atrial septum: No defect or patent foramen ovale was identified. - Tricuspid valve: There was trivial regurgitation. - Pericardium, extracardiac: There was no pericardial effusion.  Assessment and Plan:  [name] is a 32 y.o. male with history  of uncontrolled type I DM, recurrent DKAs, HTN, HLD, and current smoker admitted to [facility] on 02/16/18 for 2 day hx of right UE tingling/numbness, right LE weakness and gait instability. Found to be in DKA. MRI  showed acute and subacute left inferior frontal, left thalamus/PLIC infarcts, right SCA and left PICA small/punctate infarcts, embolic pattern, but also considered could be small vessel lacunar strokes. Also chronic infarcts at right pons and left cerebellar. MRA b/l moderate to severe stenosis of b/l ICA siphons. CUS unremarkable and EF 55-60%. LDL 201 and A1C 10.0. His RPR was 1:2 positive and confirmation test was positive, however, he denies any STD hx and was not treated for such in the past. UDS positive for THC.  He was put on ASA 162 and lipitor 20 on discharge to CIR at [facility]. His glucose still not in control, fluctuating from hypoglycemia and hyperglycemia.   His strokes etiology could be multifactorial: 1. could be synchronized small vessel infarcts given several stroke risk factors including uncontrolled DM, significant HLD, smoker, HTN.  MRA has showed b/l moderate to severe stenosis b/l ICA siphones 2. RPR 1:2 and confirmation test positive, untreated syphilis, neurosyphilis has to be in mind. ID on board, he ideally needs LP, however due to lovenox use and LP deemed challenging by ID. He is now getting treated for neurosyphilis.  3. Young pt with embolic pattern stroke, needs to rule out paradoxical emboli, will do TCD bubble study. If positive, LE venous doppler needs to perform 4. Young pt with embolic pattern, hypercoagulable state in this case less likely but needs to ruled out. homocysteine was normal, will do check antiphospholipid antibodies.  5. Given the young age, afib much less likely. However, since he has multiple atherosclerosis risk factors, 30 day cardiac event monitoring as outpt may be considered.   - recommend DAPT with ASA 81 and plavix 75mg  for 3 weeks and then ASA alone - continue statin for stroke prevention - agree with ID for neurosyphilis empiric treatment - will do TCD bubble study tomorrow - antiphospholipid antibodies ordered - consider outpt 30 day cardiac  event monitoring to rule out afib - continue aggressive PT/OT - stroke risk factor modification - DM, HTN, HLD - smoking cessation - will follow   Thank you for this consultation and allowing [place] to participate in the care of this patient.  Rosalin Hawking, MD PhD Stroke Neurology 02/23/2018 4:42 PM    To contact Stroke Continuity provider, please refer to [email or web address] After hours, contact General Neurology

## 2018-02-23 NOTE — Progress Notes (Addendum)
[facility] PHYSICAL MEDICINE & REHABILITATION     PROGRESS NOTE    Subjective/Complaints: Feeling fairly well. Sugars were low again this morning down to the 40's. Eating breakfast when I arrived  ROS: Patient denies fever, rash, sore throat, blurred vision, nausea, vomiting, diarrhea, cough, shortness of breath or chest pain, joint or back pain, headache, or mood change.   Objective:  No results found. Recent Labs    02/23/18 0718  WBC 7.7  HGB 11.9*  HCT 36.7*  PLT 240   Recent Labs    02/23/18 0718  NA 143  K 4.0  CL 106  GLUCOSE 78  BUN 15  CREATININE 1.66*  CALCIUM 9.4   CBG (last 3)  Recent Labs    02/23/18 0647 02/23/18 0704 02/23/18 0725  GLUCAP 44* 51* 119*    Wt Readings from Last 3 Encounters:  02/22/18 72.3 kg (159 lb 4.8 oz)  02/17/18 72.6 kg (160 lb)  10/23/16 67.2 kg (148 lb 2.4 oz)     Intake/Output Summary (Last 24 hours) at 02/23/2018 0828 Last data filed at 02/23/2018 0800 Gross per 24 hour  Intake 120 ml  Output 500 ml  Net -380 ml    Vital Signs: Blood pressure (!) 147/95, pulse 87, temperature 98.5 F (36.9 C), temperature source Oral, resp. rate 18, height 6\' 2"  (1.88 m), weight 72.3 kg (159 lb 4.8 oz), SpO2 100 %. Physical Exam:  Constitutional: No distress . Vital signs reviewed. HEENT: EOMI, oral membranes moist Neck: supple Cardiovascular: RRR without murmur. No JVD    Respiratory: CTA Bilaterally without wheezes or rales. Normal effort    GI: BS +, non-tender, non-distended  Musculoskeletal:  No edema or tenderness in extremities  Neurological: He is alert and oriented to person, place, and time.  Motor: Right upper extremity/right lower extremity: 4+/5 proximal to distal with decreased [place].  LUE and LLE: 5/5 proximal to distal Sensation diminished to light touch right upper extremity. Intact RLE Skin: Skin is warm and dry.  Psychiatric: He has a normal mood and affect. His behavior is normal.      Assessment/Plan: 1. 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 Physiatrist is providing close team supervision and 24 hour management of active medical problems listed below. Physiatrist and rehab team continue to assess barriers to discharge/monitor patient progress toward functional and medical goals.  Function:  Bathing Bathing position      Bathing parts      Bathing assist        Upper Body Dressing/Undressing Upper body dressing                    Upper body assist        Lower Body Dressing/Undressing Lower body dressing                                  Lower body assist        Toileting Toileting          Toileting assist     Transfers Chair/bed transfer             Locomotion Ambulation           Wheelchair          Cognition Comprehension Comprehension assist level: Follows complex conversation/direction with no assist  Expression Expression assist level: Expresses  complex ideas: With no assist  Social Interaction Social Interaction assist level: Interacts appropriately with others - No medications needed.  Problem Solving Problem solving assist level: Solves complex problems: Recognizes & self-corrects  Memory Memory assist level: Complete Independence: No helper  Medical Problem List and Plan: 1.  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   -beginning therapies today  -will request neuro input re: bi-cerebral ? Embolic strokes. ?TEE 2.  DVT Prophylaxis/Anticoagulation: Pharmaceutical: Lovenox 3. Pain Management: N/A 4. Mood: LCSW to follow for evaluation and support.  5. Neuropsych: This patient is capable of making decisions on his own behalf. 6. Skin/Wound Care: routine pressure relief measures.  7. Fluids/Electrolytes/Nutrition: Monitor I/O. Check lytes in am.  8.  T1DM with DKA:  Multiple episodes of DKA with Hgb A1C- 10. Question compliance.  Continue Levemir at nights with meal coverage. Blood sugars very labile 40 - 300 range   -requested DM coordinator input  -will decrease mealtime covg to 4u, decr pm levemir to12u and add am levemir 6u  -discussed importance of regular and appropriate po intake 9. HTN: borderline control, labile     -Cr trending back up, has CKD as well  -recheck bmet tomorrow 10. Dyslipidemia: On lipitor 11. Leucocytosis: afebrile, wbc'x 7.7 12. Acute on chronic renal failure?: Encourage fluid intake.  13. Positive RPR/T Pallidum: Pt without symptoms or obvious clinical signs. Will ask ID for input  LOS (Days) [place] EVALUATION WAS PERFORMED  Meredith Staggers, MD 02/23/2018 8:28 AM

## 2018-02-23 NOTE — Plan of Care (Signed)
  Problem: Consults Goal: RH STROKE PATIENT EDUCATION Description See Patient Education module for education specifics  Outcome: Progressing   Problem: RH SKIN INTEGRITY Goal: RH STG SKIN FREE OF INFECTION/BREAKDOWN Description No new breakdown with Mod I assist   Outcome: Progressing   Problem: RH SAFETY Goal: RH STG ADHERE TO SAFETY PRECAUTIONS W/ASSISTANCE/DEVICE Description STG Adhere to Safety Precautions With Mod I Assistance/Device.  Outcome: Progressing   Problem: RH COGNITION-NURSING Goal: RH STG ANTICIPATES NEEDS/CALLS FOR ASSIST W/ASSIST/CUES Description STG Anticipates Needs/Calls for Assist With Mod I Assistance/Cues.  Outcome: Progressing   Problem: RH PAIN MANAGEMENT Goal: RH STG PAIN MANAGED AT OR BELOW PT'S PAIN GOAL Description < 2 out of 10.   Outcome: Progressing

## 2018-02-24 ENCOUNTER — Inpatient Hospital Stay (HOSPITAL_COMMUNITY): Payer: Medicare HMO

## 2018-02-24 ENCOUNTER — Inpatient Hospital Stay (HOSPITAL_COMMUNITY): Payer: Medicare HMO | Admitting: Physical Therapy

## 2018-02-24 DIAGNOSIS — N183 Chronic kidney disease, stage 3 unspecified: Secondary | ICD-10-CM

## 2018-02-24 DIAGNOSIS — A539 Syphilis, unspecified: Secondary | ICD-10-CM

## 2018-02-24 DIAGNOSIS — Z8673 Personal history of transient ischemic attack (TIA), and cerebral infarction without residual deficits: Secondary | ICD-10-CM

## 2018-02-24 DIAGNOSIS — I63512 Cerebral infarction due to unspecified occlusion or stenosis of left middle cerebral artery: Secondary | ICD-10-CM

## 2018-02-24 DIAGNOSIS — E1122 Type 2 diabetes mellitus with diabetic chronic kidney disease: Secondary | ICD-10-CM

## 2018-02-24 LAB — GLUCOSE, CAPILLARY
GLUCOSE-CAPILLARY: 226 mg/dL — AB (ref 65–99)
GLUCOSE-CAPILLARY: 301 mg/dL — AB (ref 65–99)
Glucose-Capillary: 260 mg/dL — ABNORMAL HIGH (ref 65–99)
Glucose-Capillary: 425 mg/dL — ABNORMAL HIGH (ref 65–99)
Glucose-Capillary: 268 mg/dL — ABNORMAL HIGH (ref 65–99)

## 2018-02-24 MED ORDER — INSULIN DETEMIR 100 UNIT/ML ~~LOC~~ SOLN
10.0000 [IU] | Freq: Two times a day (BID) | SUBCUTANEOUS | Status: DC
  Administered 2018-02-24 – 2018-02-27 (×6): 10 [IU] via SUBCUTANEOUS
  Filled 2018-02-24 (×6): qty 0.1

## 2018-02-24 MED ORDER — AMLODIPINE BESYLATE 5 MG PO TABS
5.0000 mg | ORAL_TABLET | Freq: Every day | ORAL | Status: DC
Start: 2018-02-24 — End: 2018-02-28
  Administered 2018-02-24 – 2018-02-28 (×5): 5 mg via ORAL
  Filled 2018-02-24 (×5): qty 1

## 2018-02-24 MED ORDER — ATORVASTATIN CALCIUM 80 MG PO TABS
80.0000 mg | ORAL_TABLET | Freq: Every day | ORAL | Status: DC
  Administered 2018-02-24 – 2018-03-07 (×12): 80 mg via ORAL
  Filled 2018-02-24 (×12): qty 1

## 2018-02-24 MED ORDER — INSULIN ASPART 100 UNIT/ML ~~LOC~~ SOLN
0.0000 [IU] | Freq: Every day | SUBCUTANEOUS | Status: DC
  Administered 2018-02-24: 3 [IU] via SUBCUTANEOUS
  Administered 2018-02-24: 5 [IU] via SUBCUTANEOUS
  Administered 2018-02-24: 7 [IU] via SUBCUTANEOUS
  Administered 2018-02-25 (×3): 1 [IU] via SUBCUTANEOUS
  Administered 2018-02-25: 5 [IU] via SUBCUTANEOUS
  Administered 2018-02-26: 1 [IU] via SUBCUTANEOUS
  Administered 2018-02-26: 9 [IU] via SUBCUTANEOUS
  Administered 2018-02-26: 2 [IU] via SUBCUTANEOUS
  Administered 2018-02-26: 9 [IU] via SUBCUTANEOUS
  Administered 2018-02-27 (×2): 2 [IU] via SUBCUTANEOUS

## 2018-02-24 NOTE — Progress Notes (Signed)
[facility] PHYSICAL MEDICINE & REHABILITATION     PROGRESS NOTE    Subjective/Complaints: Sugars remain labile. 260 this am with follow up pre-breakfast 425. Pt denies new complaints  ROS: Patient denies fever, rash, sore throat, blurred vision, nausea, vomiting, diarrhea, cough, shortness of breath or chest pain, joint or back pain, headache, or mood change.   Objective:  No results found. Recent Labs    02/23/18 0718  WBC 7.7  HGB 11.9*  HCT 36.7*  PLT 240   Recent Labs    02/23/18 0718 02/23/18 1318  NA 143  --   K 4.0  --   CL 106  --   GLUCOSE 78 479*  BUN 15  --   CREATININE 1.66*  --   CALCIUM 9.4  --    CBG (last 3)  Recent Labs    02/23/18 2114 02/24/18 0702 02/24/18 0842  GLUCAP 154* 260* 425*    Wt Readings from Last 3 Encounters:  02/22/18 72.3 kg (159 lb 4.8 oz)  02/17/18 72.6 kg (160 lb)  10/23/16 67.2 kg (148 lb 2.4 oz)     Intake/Output Summary (Last 24 hours) at 02/24/2018 0952 Last data filed at 02/24/2018 0700 Gross per 24 hour  Intake 650 ml  Output 1200 ml  Net -550 ml    Vital Signs: Blood pressure (!) 147/94, pulse 81, temperature 98 F (36.7 C), temperature source Oral, resp. rate 19, height 6\' 2"  (1.88 m), weight 72.3 kg (159 lb 4.8 oz), SpO2 100 %. Physical Exam:  Constitutional: No distress . Vital signs reviewed. HEENT: EOMI, oral membranes moist Neck: supple Cardiovascular: RRR without murmur. No JVD    Respiratory: CTA Bilaterally without wheezes or rales. Normal effort    GI: BS +, non-tender, non-distended   Musculoskeletal:  No edema or tenderness in extremities  Neurological: He is alert and oriented to person, place, and time.  Motor: Right upper extremity/right lower extremity: 4+/5 proximal to distal with decreased [place].  LUE and LLE: 5/5 proximal to distal Sensation diminished to light touch right upper extremity. RLE generally normal Skin: Skin is warm and dry.  Psychiatric: He has a normal mood and affect.  His behavior is normal.     Assessment/Plan: 1. 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 Physiatrist is providing close team supervision and 24 hour management of active medical problems listed below. Physiatrist and rehab team continue to assess barriers to discharge/monitor patient progress toward functional and medical goals.  Function:  Bathing Bathing position   Position: Shower  Bathing parts Body parts bathed by patient: Right arm, Right lower leg, Left arm, Chest, Left lower leg, Back, Abdomen, Buttocks, Right upper leg, Left upper leg, Front perineal area    Bathing assist Assist Level: Touching or steadying assistance(Pt > 75%)      Upper Body Dressing/Undressing Upper body dressing   What is the patient wearing?: Pull over shirt/dress     Pull over shirt/dress - Perfomed by patient: Thread/unthread right sleeve, Thread/unthread left sleeve, Put head through opening Pull over shirt/dress - Perfomed by helper: Pull shirt over trunk        Upper body assist        Lower Body Dressing/Undressing Lower body dressing   What is the patient wearing?: Underwear, Pants, Socks, Shoes Underwear - Performed by patient: Thread/unthread right underwear leg, Thread/unthread left underwear leg, Pull underwear up/down   Pants- Performed by  patient: Thread/unthread right pants leg, Thread/unthread left pants leg, Pull pants up/down         Socks - Performed by helper: Don/doff right sock, Don/doff left sock   Shoes - Performed by helper: Don/doff right shoe, Don/doff left shoe, Fasten right, Fasten left          Lower body assist Assist for lower body dressing: Touching or steadying assistance (Pt > 75%)      Toileting Toileting   Toileting steps completed by patient: Performs perineal hygiene Toileting steps completed by helper: Adjust clothing prior to  toileting, Adjust clothing after toileting Toileting Assistive Devices: Grab bar or rail  Toileting assist     Transfers Chair/bed transfer   Chair/bed transfer method: Ambulatory Chair/bed transfer assist level: Touching or steadying assistance (Pt > 75%) Chair/bed transfer assistive device: Armrests, Medical sales representative     Max distance: 50' Assist level: Touching or steadying assistance (Pt > 75%)   Wheelchair          Cognition Comprehension Comprehension assist level: Follows complex conversation/direction with no assist  Expression Expression assist level: Expresses complex ideas: With no assist  Social Interaction Social Interaction assist level: Interacts appropriately with others - No medications needed.  Problem Solving Problem solving assist level: Solves complex problems: With extra time  Memory Memory assist level: Assistive device: No helper  Medical Problem List and Plan: 1.  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   -continue therapies  -appreciate neurology work up and recs 2.  DVT Prophylaxis/Anticoagulation: Pharmaceutical: Lovenox 3. Pain Management: N/A 4. Mood: LCSW to follow for evaluation and support.  5. Neuropsych: This patient is capable of making decisions on his own behalf. 6. Skin/Wound Care: routine pressure relief measures.  7. Fluids/Electrolytes/Nutrition: Monitor I/O. Check lytes in am.  8. T1DM with DKA:  Multiple episodes of DKA with Hgb A1C- 10. Question compliance.  Continue Levemir at nights with meal coverage. Blood sugars very labile 40 - 300 range   -appreciate DM coordinator input  -  mealtime covg 4u, TID with meals  - levemir 6u BID. May increase evening dose to 8-10 u based on how sugars look today  -discussed importance of regular and appropriate po intake again with patient 9. HTN: borderline control, labile     -  CKD  l  -recheck bmet Monday 10. Dyslipidemia: On lipitor 11.  Leucocytosis: afebrile, wbc'x 7.7 12. Acute on chronic renal failure?: Encourage fluid intake.  13. Positive RPR/T Pallidum: Appreciate ID and neuro input  -given risks of LP ID recs treatment   - continue IV Pen G q4  LOS (Days) 2 A FACE TO FACE EVALUATION WAS PERFORMED  Meredith Staggers, MD 02/24/2018 9:52 AM

## 2018-02-24 NOTE — Progress Notes (Signed)
Social Work Social Work Assessment and Plan  Patient Details  Name: Nathaniel [name] MRN: [id number] Date of Birth: Mar 29, 1986  Today's Date: 02/24/2018  Problem List:  Patient Active Problem List   Diagnosis Date Noted  . Hypoglycemia   . Neurosyphilis   . Mixed hyperlipidemia   . Acute ischemic left MCA stroke ([place]) 02/22/2018  . Abnormality of gait following cerebrovascular accident (CVA)   . Type 1 diabetes mellitus with peripheral circulatory complications ([place])   . Essential hypertension   . Dyslipidemia   . Syphilis   . DKA (diabetic ketoacidoses) ([place]) 02/16/2018  . CVA (cerebral vascular accident) ([place]) 02/16/2018  . Acute renal failure (ARF) ([place]) 10/21/2016  . Diabetic ketoacidosis without coma associated with type 1 diabetes mellitus ([place])   . Hyperbilirubinemia   . Hyponatremia 04/15/2016  . DM (diabetes mellitus) type 1, uncontrolled, with ketoacidosis ([place]) 04/15/2016  . Hyperglycemia 04/15/2016  . AKI (acute kidney injury) ([place]) 01/04/2016  . Malignant hypertension 01/04/2016  . Noncompliance with medications 01/04/2016  . Intractable nausea and vomiting 04/01/2015  . Gastroparesis 04/01/2015  . Type 1 diabetes mellitus with complication ([place]) 87/56/4332  . Chronic hypertension   . Nausea with vomiting   . Abscess, gluteal, right 06/21/2014  . Nausea and vomiting 04/08/2013  . Hyperglycemia without ketosis 04/08/2013  . Essential hypertension, benign 04/08/2013  . Hypercalcemia 07/29/2011  . Vomiting 07/28/2011  . Leukocytosis 07/28/2011  . Hypokalemia 07/28/2011  . DKA, type 1 ([place]) 07/27/2011  . Dehydration 07/27/2011  . Smoker 07/27/2011  . Marijuana abuse 07/27/2011   Past Medical History:  Past Medical History:  Diagnosis Date  . Diabetes mellitus    lantus/novolog  . Hyperlipidemia   . Hypertension   . Marijuana abuse    occaisionally  . Noncompliance with medication regimen   . Tobacco abuse    5/day   Past Surgical History:  Past  Surgical History:  Procedure Laterality Date  . EYE SURGERY     Social History:  reports that he has been smoking cigarettes.  He has a 2.00 pack-year smoking history. He has never used smokeless tobacco. He reports that he has current or past drug history. Drug: Marijuana. He reports that he does not drink alcohol.  Family / Support Systems Marital Status: Single Patient Roles: Other (Comment)(son, brother) Other Supports: mother, Nathaniel [name] @ (H) [phone] or ([phone] Anticipated Caregiver: mother Ability/Limitations of Caregiver: Mom does not work.  Mom cares for her mildly autistic daughter at home. Caregiver Availability: 24/7 Family Dynamics: Patient reports very of family and mother are very encouraging.  Mother states she is prepared to provide any assistance needed and discharge.  Social History Preferred language: English Religion:  Cultural Background: NA Education: HS Read: Yes Write: Yes Employment Status: Disabled Date Retired/Disabled/Unemployed: Has been receiving disability benefits since childhood Legal Hisotry/Current Legal Issues: None Guardian/Conservator: None-Per MD, patient is capable of making decisions on his own behalf.   Abuse/Neglect Abuse/Neglect Assessment Can Be Completed: Yes Physical Abuse: Denies Verbal Abuse: Denies Sexual Abuse: Denies Exploitation of patient/patient's resources: Denies Self-Neglect: Denies  Emotional Status Pt's affect, behavior adn adjustment status: Pt very pleasant and completes assessment interview without any difficulty.  Patient does report disbelief "that I actually had a stroke and  more than one."  Patient also speaks openly about his new diagnosis of syphilis and awareness that he will need to be followed for management of this.  Patient denies any significant emotional distress, however, may benefit from referral for  neuropsychology. Recent Psychosocial Issues: None Pyschiatric History:  None Substance Abuse History: None  Patient / Family Perceptions, Expectations & Goals Pt/Family understanding of illness & functional limitations: Patient and mother both have good understanding of his strokes and resulting functional limitations/need for CIR. Premorbid pt/family roles/activities: Patient completely independent PTA Anticipated changes in roles/activities/participation: Little change anticipated with goals being set for modified independent. Pt/family expectations/goals: "I really hope I get my arm and leg working."  [employer]: None Premorbid Home Care/DME Agencies: None Transportation available at discharge: yes Resource referrals recommended: Neuropsychology, Support group (specify)  Discharge Planning Living Arrangements: Parent Support Systems: Parent, Other relatives Type of Residence: Private residence Insurance Resources: Medicare(A[employer]na Medicare) Financial Resources: SSI Financial Screen Referred: No Living Expenses: Lives with family Money Management: Patient Does the patient have any problems obtaining your medications?: No Home Management: Patient and mother share responsibilities Patient/Family Preliminary Plans: Patient to return home with mother as primary support person. Social Work Anticipated Follow Up Needs: HH/OP Expected length of stay: 10-12 days  Clinical Impression Unfortunate gentleman here after suffering multiple strokes which are likely related to his diabetes.  He has also received a new diagnosis of syphilis on which she is being educated.  He is very pleasant and  completes the assessment interview without difficulty.  Patient has very good support from mother who is available to provide 24/7 care.  Social work will follow for support and discharge planning needs.  Nathaniel [name] 02/24/2018, 12:30 PM

## 2018-02-24 NOTE — Care Management (Signed)
Inpatient [facility] Individual Statement of Services  Patient Name:  Nathaniel [name]  Date:  02/24/2018  Welcome to the [facility].  Our goal is to provide you with an individualized program based on your diagnosis and situation, designed to meet your specific needs.  With this comprehensive rehabilitation program, you will be expected to participate in at least 3 hours of rehabilitation therapies Monday-Friday, with modified therapy programming on the weekends.  Your rehabilitation program will include the following services:  Physical Therapy (PT), Occupational Therapy (OT), 24 hour per day rehabilitation nursing, Therapeutic Recreaction (TR), Neuropsychology, Case Management (Social Worker), Rehabilitation Medicine, Nutrition Services and Pharmacy Services  Weekly team conferences will be held on Tuesdays to discuss your progress.  Your Social Worker will talk with you frequently to get your input and to update you on team discussions.  Team conferences with you and your family in attendance may also be held.  Expected length of stay: 10-12 days    Overall anticipated outcome: modified independent  Depending on your progress and recovery, your program may change. Your Social Worker will coordinate services and will keep you informed of any changes. Your Social Worker's name and contact numbers are listed  below.  The following services may also be recommended but are not provided by the [facility] will be made to provide these services after discharge if needed.  Arrangements include referral to agencies that provide these services.  Your insurance has been verified to be:  [employer] Your primary doctor is:  [name]  Pertinent information will be shared with your doctor and your  insurance company.  Social Worker:  [place], [place] or ([phone]   Information discussed with and copy given to patient by: Lennart Pall, 02/24/2018, 1:08 PM

## 2018-02-24 NOTE — IPOC Note (Signed)
Overall Plan of Care [facility]) Patient Details Name: Nathaniel [name] MRN: [id number] DOB: 07-16-1986  Admitting Diagnosis: <principal problem not specified>left MCA infarct  Hospital Problems: Active Problems:   Smoker   Acute ischemic left MCA stroke (HCC)   Hypoglycemia   Neurosyphilis   Mixed hyperlipidemia     Functional Problem List: Nursing Endurance, Medication Management, Motor, Sensory, Nutrition  PT Balance, Endurance, Motor, Sensory, Safety  OT Balance, Perception, Safety, Sensory, Endurance, Motor  SLP    TR         Basic ADL's: OT Grooming, Bathing, Dressing, Toileting     Advanced  ADL's: OT Simple Meal Preparation, Laundry     Transfers: PT Bed Mobility, Bed to Chair, Musician, [employer], Futures trader, Metallurgist: PT Ambulation, Stairs     Additional Impairments: OT Fuctional Use of Upper Extremity  SLP        TR      Anticipated Outcomes Item Anticipated Outcome  Self Feeding Mod I  Swallowing      Basic self-care  Mod I  Toileting  Mod I   Bathroom Transfers Supervision  Bowel/Bladder  Pt will manage bowel and bladder with Mod I assist at discharge.   Transfers  mod I  Locomotion  mod I with LRAD  Communication     Cognition     Pain  Pt will manage pain at 2 or less on a scale of 0-10.   Safety/Judgment  Pt will remain free of skin breakdown and infection with Mod I assist    Therapy Plan: PT Intensity: Minimum of 1-2 x/day ,45 to 90 minutes PT Frequency: 5 out of 7 days PT Duration Estimated Length of Stay: 10-12 days OT Intensity: Minimum of 1-2 x/day, 45 to 90 minutes OT Frequency: 5 out of 7 days OT Duration/Estimated Length of Stay: 10-12 days      Team Interventions: Nursing Interventions Discharge Planning, Disease Management/Prevention, Medication Management  PT interventions Ambulation/gait training, Community reintegration, DME/adaptive equipment instruction, Neuromuscular re-education,  Psychosocial support, Stair training, UE/LE Strength taining/ROM, Training and development officer, Discharge planning, Functional electrical stimulation, Therapeutic Activities, UE/LE Coordination activities, Functional mobility training, Patient/family education, Splinting/orthotics, Therapeutic Exercise  OT Interventions Balance/vestibular training, Discharge planning, Functional electrical stimulation, Pain management, Self Care/advanced ADL retraining, Therapeutic Activities, UE/LE Coordination activities, Therapeutic Exercise, Patient/family education, Functional mobility training, Disease mangement/prevention, Neuromuscular re-education, Psychosocial support, UE/LE Strength taining/ROM, DME/adaptive equipment instruction, Community reintegration  SLP Interventions    TR Interventions    SW/CM Interventions Discharge Planning, Psychosocial Support, Patient/Family Education   Barriers to Discharge MD  Medical stability  Nursing      PT Medication compliance    OT      SLP      SW       Team Discharge Planning: Destination: PT-Home ,OT- Home , SLP-  Projected Follow-up: PT-Home health PT, OT-  Home health OT, SLP-  Projected Equipment Needs: PT-To be determined, OT- To be determined, SLP-  Equipment Details: PT- , OT-  Patient/family involved in discharge planning: PT- Patient,  OT-Patient, SLP-   MD ELOS: 10-12 days Medical Rehab Prognosis:  Excellent Assessment: The patient has been admitted for CIR therapies with the diagnosis of left MCA infarct. The team will be addressing functional mobility, strength, stamina, balance, safety, adaptive techniques and equipment, self-care, bowel and bladder mgt, patient and caregiver education, NMR, ego support, community reentry. Goals have been set at mod I for mobility and self-care. Care complicated with syphilis  diagnosis, ? Neurological involvement.    Meredith Staggers, MD, FAAPMR      See Team Conference Notes for weekly updates to the  plan of care

## 2018-02-24 NOTE — Progress Notes (Signed)
Physical Therapy Session Note  Patient Details  Name: Nathaniel [name] MRN: [id number] Date of Birth: 07-30-1986  Today's Date: 02/24/2018 PT Individual Time: 5189-8421 PT Individual Time Calculation (min): 30 min   Short Term Goals: Week 1:  PT Short Term Goal 1 (Week 1): Pt will transfer with supervision.  PT Short Term Goal 2 (Week 1): Pt will ambulate 150' with supervision and LRAD.  PT Short Term Goal 3 (Week 1): Pt will negotiate 8 steps with supervision. PT Short Term Goal 4 (Week 1): Pt will complete formal balance outcome measure.   Skilled Therapeutic Interventions/Progress Updates:   Pt reported no c/o pain prior to session. PT initiated squat pivot transfer from EOB>w/c providing v/c's for safety consideration for successful task completion. PT initiated gait training using RW and steadying assist for 10' distance. Pt demo'd dec cadence, step length, and forward trunk lean. PT provided tactile cues to anterior shoulder to initiate upright posture in which Pt responded well to. Pt required vc's for proper sequencing to perform sitting EOM using RW requiring steadying assist for turning. PT initiated D1 assisted functional mobility activity to B LE's in supine for x10 reps for improved motor control. PT notes tightness in B internal rotators, hip flexors, and hamstrings, which possibly contributes to flexed posture during gait. PT then initiated resisted D1 in supine to both LE's for x10 reps. PT provided contract-relax stretching to L LE internal rotators in which PT noted were especially tight. PT provided hip flexor stretching with Pt and education regarding the importance of stretching B hip flexors in either prone or supine with extremity placed off edge of bed safely for 3 sets/30 sec duration. PT then initiated gait training from EOM>w/c using RW providing vc's for leading with right LE in a step-to-gait pattern allowing Pt to see his R LE while stepping to provide visual feedback  to compensate for lack of proprioceptive awareness. Pt reported he felt his second walk of 10' back to his RW "felt better". PT notes Pt flexed posture has improved during second gait trial. Pt required vc's for proper sequencing of RW when turning to be seated in w/c which may be attributed to potential motor planning deficits. PT positioned Pt in seated in his w/c in room with call bell in reach and all needs met. Pt's mother and sister were also present in the room during this time.  Therapy Documentation Precautions:  Precautions Precautions: Fall Precaution Comments: RLE ataxia and hemiparesis Restrictions Weight Bearing Restrictions: No   See Function Navigator for Current Functional Status.   Therapy/Group: Individual Therapy  Floreen Comber 02/24/2018, 4:59 PM

## 2018-02-24 NOTE — Progress Notes (Signed)
Physical Therapy Session Note  Patient Details  Name: Nathaniel [name] MRN: [id number] Date of Birth: 02-Jul-1986  Today's Date: 02/24/2018 PT Individual Time: [phone] PT Time Calculation: 60 min  Short Term Goals: Week 1:  PT Short Term Goal 1 (Week 1): Pt will transfer with supervision.  PT Short Term Goal 2 (Week 1): Pt will ambulate 150' with supervision and LRAD.  PT Short Term Goal 3 (Week 1): Pt will negotiate 8 steps with supervision. PT Short Term Goal 4 (Week 1): Pt will complete formal balance outcome measure.   Skilled Therapeutic Interventions/Progress Updates:    no c/o pain.  Session focus on NMR for ambulation and balance, and activity tolerance.    Pt transported to/from therapy gym in w/c for energy conservation.  Pt completes TUG with RW, and overall min assist for balance, average across 3 trials was 1 min 45 seconds.  Educated pt on results and falls risk, recommendation for use of RW and continued assist from staff while balance and safety progresses.    NMR in standing with alternating cone taps and toe taps to 4" box with bilateral HHA (mod A +2), progress to min assist HHA +1 with practice.  Pt completes 3 trials of toe taps to fatigue, with seated rest breaks in between.  Utilized 2# weight on RLE for improved proprioception.  NMR with step ups to 6" step with RLE, PT providing manual facilitation at quad/hamstrings for improved coordination and reduced genu recurvatum.    Pt returned to room at end of session, positioned upright in w/c with call bell in reach and needs met.    Therapy Documentation Precautions:  Precautions Precautions: Fall Precaution Comments: RLE ataxia and hemiparesis Restrictions Weight Bearing Restrictions: No   See Function Navigator for Current Functional Status.   Therapy/Group: Individual Therapy  Michel Santee 02/24/2018, 4:08 PM

## 2018-02-24 NOTE — Progress Notes (Signed)
Pt left unit via bed with transport down to CT for transcranial. No c/o pain. Will f/up when return. [facility] LPN

## 2018-02-24 NOTE — Progress Notes (Signed)
Occupational Therapy Session Note  Patient Details  Name: Nathaniel [name] MRN: [id number] Date of Birth: 09-04-1986  Today's Date: 02/24/2018 OT Individual Time: 8209-9068 OT Individual Time Calculation (min): 44 min    Short Term Goals: Week 1:  OT Short Term Goal 1 (Week 1): Pt will complete toilet transfer with min A  OT Short Term Goal 2 (Week 1): Pt will don B shoes with supervision using AE PRN.  OT Short Term Goal 3 (Week 1): Pt will complete functional ambulate within room with CGA using LRAD. OT Short Term Goal 4 (Week 1): Pt will use R UE at non-dominant level with cuing for encouragement OT Short Term Goal 5 (Week 1): Pt will complete standing grooming task with supervision  Skilled Therapeutic Interventions/Progress Updates:    1;1. No pain reported. Pt eating breakfast EOB with supervision. OT applies red foam handle for improved control over utensil. Pt completes stand pivot transfer to w/c from EOB without AD with VC for hand placement on arm rest. Pt completes [facility] activities of buttons and palm<>finger translation and rotation of checkers with Vc for decreasing compensatory strategies. Exited session with pt saetaed in w/c with chair alarm on, call light in reach and all needs met  Therapy Documentation Precautions:  Precautions Precautions: Fall Precaution Comments: RLE ataxia and hemiparesis Restrictions Weight Bearing Restrictions: No General:    See Function Navigator for Current Functional Status.   Therapy/Group: Individual Therapy  Tonny Branch 02/24/2018, 2:31 PM

## 2018-02-24 NOTE — Progress Notes (Signed)
Occupational Therapy Session Note  Patient Details  Name: Nathaniel [name] MRN: [id number] Date of Birth: [birth year]  Today's Date: 02/24/2018 OT Individual Time: 1219-7588 OT Individual Time Calculation (min): 56 min    Short Term Goals: Week 1:  OT Short Term Goal 1 (Week 1): Pt will complete toilet transfer with min A  OT Short Term Goal 2 (Week 1): Pt will don B shoes with supervision using AE PRN.  OT Short Term Goal 3 (Week 1): Pt will complete functional ambulate within room with CGA using LRAD. OT Short Term Goal 4 (Week 1): Pt will use R UE at non-dominant level with cuing for encouragement OT Short Term Goal 5 (Week 1): Pt will complete standing grooming task with supervision  Skilled Therapeutic Interventions/Progress Updates:    1;1. Pt completes stand pivot transfer with min-mod A for stand pivot transfers with VC for hand placement on grab bar and walker throughout session with facilitation at hips and R knee for balance and support. Pt completes bathing at seated level leaning laterally to wash buttocks with Vc for seated figure 4 to wash feet as well as encouraged use of RUE for all washing tasks. Pt dresses at sit to stand level at sink with VC for hemi dressing with min A for standing balance at sink. IV looking to be draining blood. RN alerted to check on it. Exited session wihtpt seated in w/c, call light in reach and set up with breakgast tray and NT in room.   Therapy Documentation Precautions:  Precautions Precautions: Fall Precaution Comments: RLE ataxia and hemiparesis Restrictions Weight Bearing Restrictions: No General:   Vital Signs: Therapy Vitals Temp: 98 F (36.7 C) Temp Source: Oral Pulse Rate: 81 Resp: 19 BP: (!) 147/94 Patient Position (if appropriate): Lying Oxygen Therapy SpO2: 100 % O2 Device: Room Air Pain: Pain Assessment Pain Scale: 0-10 Pain Score: 0-No pain  See Function Navigator for Current Functional  Status.   Therapy/Group: Individual Therapy  Tonny Branch 02/24/2018, 7:57 AM

## 2018-02-24 NOTE — Plan of Care (Signed)
  Problem: Consults Goal: RH STROKE PATIENT EDUCATION Description See Patient Education module for education specifics  Outcome: Progressing   Problem: RH SKIN INTEGRITY Goal: RH STG SKIN FREE OF INFECTION/BREAKDOWN Description No new breakdown with Mod I assist   Outcome: Progressing   Problem: RH SAFETY Goal: RH STG ADHERE TO SAFETY PRECAUTIONS W/ASSISTANCE/DEVICE Description STG Adhere to Safety Precautions With Mod I Assistance/Device.  Outcome: Progressing   Problem: RH COGNITION-NURSING Goal: RH STG ANTICIPATES NEEDS/CALLS FOR ASSIST W/ASSIST/CUES Description STG Anticipates Needs/Calls for Assist With Mod I Assistance/Cues.  Outcome: Progressing   Problem: RH PAIN MANAGEMENT Goal: RH STG PAIN MANAGED AT OR BELOW PT'S PAIN GOAL Description < 2 out of 10.   Outcome: Progressing

## 2018-02-24 NOTE — Progress Notes (Signed)
STROKE TEAM PROGRESS NOTE   SUBJECTIVE (INTERVAL HISTORY) Pt was seen at vascular lab for TCD bubble study which was negative for PFO.  Overall he feels his condition is stable. Working with PT/OT. Had penicillin for neurosyphilis treatment, no side effects.   OBJECTIVE Temp:  [98 F (36.7 C)-98.8 F (37.1 C)] 98 F (36.7 C) (06/07 0507) Pulse Rate:  [81-102] 81 (06/07 0507) Resp:  [18-19] 19 (06/07 0507) BP: (130-155)/(83-94) 147/94 (06/07 0507) SpO2:  [100 %] 100 % (06/07 0507)  Recent Labs  Lab 02/23/18 1705 02/23/18 2114 02/24/18 0702 02/24/18 0842 02/24/18 1153  GLUCAP 218* 154* 260* 425* 268*   Recent Labs  Lab 02/18/18 0643 02/19/18 0559 02/20/18 0629 02/23/18 0718 02/23/18 1318  NA 141 137 141 143  --   K 3.5 3.7 3.8 4.0  --   CL 109 106 109 106  --   CO2 25 24 26 28   --   GLUCOSE 30* 197* 44* 78 479*  BUN 17 11 15 15   --   CREATININE 1.58* 1.34* 1.51* 1.66*  --   CALCIUM 9.1 8.8* 9.2 9.4  --    Recent Labs  Lab 02/19/18 1201 02/23/18 0718  AST 23 23  ALT 16* 21  ALKPHOS 76 76  BILITOT 0.7 0.8  PROT 7.0 6.2*  ALBUMIN 3.1* 2.9*   Recent Labs  Lab 02/23/18 0718  WBC 7.7  NEUTROABS 4.2  HGB 11.9*  HCT 36.7*  MCV 88.2  PLT 240   No results for input(s): CKTOTAL, CKMB, CKMBINDEX, TROPONINI in the last 168 hours. No results for input(s): LABPROT, INR in the last 72 hours. No results for input(s): COLORURINE, LABSPEC, [place], GLUCOSEU, HGBUR, BILIRUBINUR, KETONESUR, PROTEINUR, UROBILINOGEN, NITRITE, LEUKOCYTESUR in the last 72 hours.  Invalid input(s): APPERANCEUR     Component Value Date/Time   CHOL 260 (H) 02/19/2018 0559   TRIG 93 02/19/2018 0559   HDL 40 (L) 02/19/2018 0559   CHOLHDL 6.5 02/19/2018 0559   VLDL 19 02/19/2018 0559   LDLCALC 201 (H) 02/19/2018 0559   Lab Results  Component Value Date   HGBA1C 10.0 (H) 02/19/2018      Component Value Date/Time   LABOPIA NONE DETECTED 02/16/2018 1909   COCAINSCRNUR NONE DETECTED  02/16/2018 1909   LABBENZ NONE DETECTED 02/16/2018 1909   AMPHETMU NONE DETECTED 02/16/2018 1909   THCU POSITIVE (A) 02/16/2018 1909   LABBARB NONE DETECTED 02/16/2018 1909    No results for input(s): ETH in the last 168 hours.  I have personally reviewed the radiological images below and agree with the radiology interpretations.  Dg Chest 2 View  Result Date: 02/16/2018 CLINICAL DATA:  Stroke.  No chest pain. EXAM: CHEST - 2 VIEW COMPARISON:  July 19, 2016 FINDINGS: The heart size and mediastinal contours are within normal limits. Both lungs are clear. The visualized skeletal structures are unremarkable. IMPRESSION: No active cardiopulmonary disease. Electronically Signed   By: Dorise Bullion III M.D   On: 02/16/2018 22:48   Ct Head Wo Contrast  Result Date: 02/16/2018 CLINICAL DATA:  Progressively worsening right hand numbness since yesterday. History of diabetes. EXAM: CT HEAD WITHOUT CONTRAST TECHNIQUE: Contiguous axial images were obtained from the base of the skull through the vertex without intravenous contrast. COMPARISON:  MRI of the head January 09, 2014 FINDINGS: BRAIN: No intraparenchymal hemorrhage, mass effect nor midline shift. The ventricles and sulci are normal. No acute large vascular territory infarcts. No abnormal extra-axial fluid collections. New patchy hypodensity LEFT posterior limb  of the internal capsule. Basal cisterns are patent. VASCULAR: Mild calcific atherosclerosis carotid siphons. SKULL/SOFT TISSUES: No skull fracture. No significant soft tissue swelling. ORBITS/SINUSES: The included ocular globes and orbital contents are normal.LEFT middle ear and mastoid effusion. Status post bilateral ocular lens implants. OTHER: None. IMPRESSION: 1. New LEFT internal capsule white matter changes seen with chronic small vessel ischemic disease though, given RIGHT-sided numbness this may be acute. 2. LEFT middle ear and mastoid effusion. 3. Mild atherosclerosis. Electronically  Signed   By: Elon Alas M.D.   On: 02/16/2018 19:57   [name] DV Contrast  Result Date: 02/20/2018 CLINICAL DATA:  Acute right cerebellar, left thalamic, and left frontal lobe infarcts. Abnormal MRA complicated by motion artifact. Follow-up MRA. EXAM: MRA HEAD WITHOUT CONTRAST TECHNIQUE: Angiographic images of the Circle of Willis were obtained using MRA technique without intravenous contrast. COMPARISON:  MRI and MRA head 02/17/2018 FINDINGS: Atherosclerotic irregularity is present at the cavernous and precavernous internal carotid arteries bilaterally. There is less patient motion on today's study. No significant stenosis is present relative to the more distal vessels. The A1 and M1 segments are normal. No definite anterior communicating artery is present. MCA bifurcations are intact. ACA and MCA branch vessels are within normal limits. The left vertebral artery is the dominant vessel. PICA origin is visualized and normal. Right AICA is dominant. The basilar artery is normal. Both posterior cerebral arteries originate from basilar tip. There is some attenuation of distal PCA branch vessels bilaterally without a significant proximal stenosis or occlusion. No aneurysms are present. IMPRESSION: 1. Atherosclerotic changes within the cavernous and precavernous internal carotid arteries bilaterally, left greater than right, without significant stenosis relative to the more distal vessels. 2. Mild distal small vessel disease most evident and posterior circulation without other significant proximal stenosis, aneurysm, or branch vessel occlusion. Electronically Signed   By: San Morelle M.D.   On: 02/20/2018 09:11   Mr Brain Wo Contrast  Result Date: 02/17/2018 CLINICAL DATA:  32 year old male with progressive right side numbness in the arm and leg since yesterday. Diabetes. EXAM: MRI HEAD WITHOUT CONTRAST MRA HEAD WITHOUT CONTRAST TECHNIQUE: Multiplanar, multiecho pulse sequences of the brain and  surrounding structures were obtained without intravenous contrast. Angiographic images of the head were obtained using MRA technique without contrast. COMPARISON:  Head CT without contrast 02/16/2018. Cervical spine MRI 11/19/2015. Brain MRI 01/09/2014. FINDINGS: MRI HEAD FINDINGS Brain: Oval 17 millimeter focus of restricted diffusion in the lateral left thalamus and posterior limb left internal capsule as seen on series 5, image 49. Associated T2 and FLAIR hyperintensity. No hemorrhage or mass effect. There are also 2 smaller, up to 10 millimeters, subcortical white matter foci of restricted diffusion in the anterior left frontal lobe (series 3, image 82). Similar T2 and FLAIR hyperintensity with no hemorrhage or mass effect. Furthermore, there is a 12 millimeter area of abnormal trace diffusion in the right superior cerebellum (series 3, image 73) which appears T2 and FLAIR hyperintense but isointense on ADC compatible with a small subacute infarct. No hemorrhage or mass effect. Multiple chronic small lacunar infarcts bilateral cerebellum and dorsal right pons are new since 2013. Outside of the acute findings the deep gray matter nuclei remain normal. Periventricular white matter T2 and FLAIR hyperintensity has progressed since 2013 outside of the acute areas, including the right periatrial white matter on series 13, image 25. No cerebral cortical encephalomalacia identified. No chronic cerebral blood products identified. No midline shift, mass effect, evidence of mass  lesion, ventriculomegaly, extra-axial collection or acute intracranial hemorrhage. Cervicomedullary junction and pituitary are within normal limits. Vascular: Major intracranial vascular flow voids are stable since 2015. Skull and upper cervical spine: Negative visible cervical spine. Visualized bone marrow signal is within normal limits. Sinuses/Orbits: Stable orbits soft tissues with postoperative changes to both globes. Mild paranasal sinus  mucosal thickening has regressed. Other: Chronic left mastoid effusion is stable to mildly improved. Trace right mastoid fluid is stable. Grossly normal visible internal auditory structures. Grossly negative scalp and face soft tissues. MRA HEAD FINDINGS Antegrade flow in the posterior circulation with codominant distal vertebral arteries. No stenosis. Normal left PICA origin. The right AICA may be dominant. Patent vertebrobasilar junction and basilar artery without stenosis. Patent SCA and PCA origins. Both posterior communicating arteries are present. Bilateral PCA branches are within normal limits. Antegrade flow in both ICA siphons with atherosclerotic versus artifactual bilateral cavernous and proximal supraclinoid siphon irregularity and narrowing (series 103, image 7). Normal ophthalmic and posterior communicating artery origins. Normal appearance of the supraclinoid ICAs. Patent carotid termini. Normal MCA and ACA origins. Anterior communicating artery and visible ACA branches are normal. The left MCA bifurcates early. The left M1, bifurcation, and visible left MCA branches are within normal limits. The right MCA M1, bifurcation, and visible right MCA branches are within normal limits. IMPRESSION: 1. Acute lacunar infarct of the Left lateral thalamus/internal capsule corresponding to the CT finding yesterday. No associated hemorrhage or mass effect. 2. Additional small acute lacunar infarcts in the anterior left frontal lobe subcortical white matter (Left MCA territory). Subacute infarct of the Right superior cerebellum (Right SCA territory). No hemorrhage or mass effect. 3. Multiple underlying chronic lacunar infarcts in the cerebellum and the right pons are new since 2015. 4. Intracranial MRA remarkable for moderate irregularity and stenosis of the bilateral cavernous ICAs. This is suspicious for advanced ICA atherosclerosis in this clinical setting, but might be exacerbated by artifact from hyperplastic  sphenoid paranasal sinuses. Electronically Signed   By: Genevie Ann M.D.   On: 02/17/2018 07:30   US Carotid Bilateral (at [facility] Only)  Result Date: 02/17/2018 CLINICAL DATA:  Acute cerebral infarction. EXAM: BILATERAL CAROTID DUPLEX ULTRASOUND TECHNIQUE: Pearline Cables scale imaging, color Doppler and duplex ultrasound were performed of bilateral carotid and vertebral arteries in the neck. COMPARISON:  None. FINDINGS: Criteria: Quantification of carotid stenosis is based on velocity parameters that correlate the residual internal carotid diameter with NASCET-based stenosis levels, using the diameter of the distal internal carotid lumen as the denominator for stenosis measurement. The following velocity measurements were obtained: RIGHT ICA:  77/23 cm/sec CCA:  676/19 cm/sec SYSTOLIC ICA/CCA RATIO:  0.6 ECA:  217 cm/sec LEFT ICA:  80/23 cm/sec CCA:  509/32 cm/sec SYSTOLIC ICA/CCA RATIO:  0.7 ECA:  114 cm/sec RIGHT CAROTID ARTERY: No focal plaque identified. Normal velocities and waveforms. No evidence of right carotid stenosis. RIGHT VERTEBRAL ARTERY: Antegrade flow with normal waveform and velocity. LEFT CAROTID ARTERY: Trace plaque at the level of the left carotid bulb. No evidence of ICA plaque or stenosis. LEFT VERTEBRAL ARTERY: Antegrade flow with normal waveform and velocity. IMPRESSION: Minimal plaque at the level of the left carotid bulb. No evidence of bilateral ICA plaque or stenosis. Electronically Signed   By: Aletta Edouard M.D.   On: 02/17/2018 08:22   Mr Jodene Nam Head/brain IZ Cm  Result Date: 02/17/2018 CLINICAL DATA:  32 year old male with progressive right side numbness in the arm and leg since yesterday. Diabetes. EXAM: MRI  HEAD WITHOUT CONTRAST MRA HEAD WITHOUT CONTRAST TECHNIQUE: Multiplanar, multiecho pulse sequences of the brain and surrounding structures were obtained without intravenous contrast. Angiographic images of the head were obtained using MRA technique without contrast. COMPARISON:   Head CT without contrast 02/16/2018. Cervical spine MRI 11/19/2015. Brain MRI 01/09/2014. FINDINGS: MRI HEAD FINDINGS Brain: Oval 17 millimeter focus of restricted diffusion in the lateral left thalamus and posterior limb left internal capsule as seen on series 5, image 49. Associated T2 and FLAIR hyperintensity. No hemorrhage or mass effect. There are also 2 smaller, up to 10 millimeters, subcortical white matter foci of restricted diffusion in the anterior left frontal lobe (series 3, image 82). Similar T2 and FLAIR hyperintensity with no hemorrhage or mass effect. Furthermore, there is a 12 millimeter area of abnormal trace diffusion in the right superior cerebellum (series 3, image 73) which appears T2 and FLAIR hyperintense but isointense on ADC compatible with a small subacute infarct. No hemorrhage or mass effect. Multiple chronic small lacunar infarcts bilateral cerebellum and dorsal right pons are new since 2013. Outside of the acute findings the deep gray matter nuclei remain normal. Periventricular white matter T2 and FLAIR hyperintensity has progressed since 2013 outside of the acute areas, including the right periatrial white matter on series 13, image 25. No cerebral cortical encephalomalacia identified. No chronic cerebral blood products identified. No midline shift, mass effect, evidence of mass lesion, ventriculomegaly, extra-axial collection or acute intracranial hemorrhage. Cervicomedullary junction and pituitary are within normal limits. Vascular: Major intracranial vascular flow voids are stable since 2015. Skull and upper cervical spine: Negative visible cervical spine. Visualized bone marrow signal is within normal limits. Sinuses/Orbits: Stable orbits soft tissues with postoperative changes to both globes. Mild paranasal sinus mucosal thickening has regressed. Other: Chronic left mastoid effusion is stable to mildly improved. Trace right mastoid fluid is stable. Grossly normal visible  internal auditory structures. Grossly negative scalp and face soft tissues. MRA HEAD FINDINGS Antegrade flow in the posterior circulation with codominant distal vertebral arteries. No stenosis. Normal left PICA origin. The right AICA may be dominant. Patent vertebrobasilar junction and basilar artery without stenosis. Patent SCA and PCA origins. Both posterior communicating arteries are present. Bilateral PCA branches are within normal limits. Antegrade flow in both ICA siphons with atherosclerotic versus artifactual bilateral cavernous and proximal supraclinoid siphon irregularity and narrowing (series 103, image 7). Normal ophthalmic and posterior communicating artery origins. Normal appearance of the supraclinoid ICAs. Patent carotid termini. Normal MCA and ACA origins. Anterior communicating artery and visible ACA branches are normal. The left MCA bifurcates early. The left M1, bifurcation, and visible left MCA branches are within normal limits. The right MCA M1, bifurcation, and visible right MCA branches are within normal limits. IMPRESSION: 1. Acute lacunar infarct of the Left lateral thalamus/internal capsule corresponding to the CT finding yesterday. No associated hemorrhage or mass effect. 2. Additional small acute lacunar infarcts in the anterior left frontal lobe subcortical white matter (Left MCA territory). Subacute infarct of the Right superior cerebellum (Right SCA territory). No hemorrhage or mass effect. 3. Multiple underlying chronic lacunar infarcts in the cerebellum and the right pons are new since 2015. 4. Intracranial MRA remarkable for moderate irregularity and stenosis of the bilateral cavernous ICAs. This is suspicious for advanced ICA atherosclerosis in this clinical setting, but might be exacerbated by artifact from hyperplastic sphenoid paranasal sinuses. Electronically Signed   By: Genevie Ann M.D.   On: 02/17/2018 07:30   TTE - Left ventricle: The cavity  size was normal. Wall thickness  was   increased in a pattern of mild LVH. Systolic function was normal.   The estimated ejection fraction was in the range of 55% to 60%.   Wall motion was normal; there were no regional wall motion   abnormalities. Left ventricular diastolic function parameters   were normal for the patient&'s age. - Mitral valve: Mildly thickened leaflets. There was mild   regurgitation. - Right atrium: Central venous pressure (est): 3 mm Hg. - Atrial septum: No defect or patent foramen ovale was identified. - Tricuspid valve: There was trivial regurgitation. - Pericardium, extracardiac: There was no pericardial effusion.  TCD bubble study Negative for PFO   PHYSICAL EXAM  Temp:  [98 F (36.7 C)-98.8 F (37.1 C)] 98 F (36.7 C) (06/07 0507) Pulse Rate:  [81-102] 81 (06/07 0507) Resp:  [18-19] 19 (06/07 0507) BP: (130-155)/(83-94) 147/94 (06/07 0507) SpO2:  [100 %] 100 % (06/07 0507)  General - well nourished, well developed, in no apparent distress.    Ophthalmologic - fundi not visualized due to noncooperation.    Cardiovascular - regular rate and rhythm  Mental Status -  Level of arousal and orientation to time, place, and person were intact. Language including expression, naming, repetition, comprehension was assessed and found intact. Fund of Knowledge was assessed and was intact.  Cranial Nerves II - XII - II - Vision intact OU. III, IV, VI - Extraocular movements intact. V - Facial sensation intact bilaterally. VII - Facial movement intact bilaterally. VIII - Hearing & vestibular intact bilaterally. X - Palate elevates symmetrically. XI - Chin turning & shoulder shrug intact bilaterally. XII - Tongue protrusion intact.  Motor Strength - The patient's strength was normal in all extremities except right hand mild dexterity difficulty and right LE 4/5 and pronator drift was absent.   Motor Tone & Bulk - Muscle tone was assessed at the neck and appendages and was normal.   Bulk was normal and fasciculations were absent.   Reflexes - The patient's reflexes were normal in all extremities and he had no pathological reflexes.  Sensory - Light touch, temperature/pinprick were assessed and were normal, however, pt felt left UE tingling.    Coordination - The patient had mild dysmetria at LUE and ataxia at LLE, slight out of proportional to the weakness.  Tremor was absent.  Gait and Station - deferred   ASSESSMENT/PLAN [name] is a 32 y.o. male with history of uncontrolled type I DM, recurrent DKAs, HTN, HLD, and current smoker admitted to [facility] on 02/16/18 for 2 day hx of right UE tingling/numbness, right LE weakness and gait instability. Found to be in DKA.UDS positive for THC.  He was put on ASA 162 and lipitor 20 on discharge to CIR at [facility]. His glucose still not in control, fluctuating from hypoglycemia and hyperglycemia.   Stroke: acute and subacute left inferior frontal, left thalamus/PLIC infarcts, right SCA and left PICA small/punctate infarcts. In such young pt, stroke etiology uncertain but DDx include neurosyphilis, synchronized small vessel lacunar strokes given multiple uncontrolled risk factors. cardioembolic and hypercoagulable state are less likely but need to ruled out.  Resultant mild right sided hemiparesis  MRI showed acute and subacute left inferior frontal, left thalamus/PLIC infarcts, right SCA and left PICA small/punctate infarcts. Also chronic infarcts at right pons and left cerebellar.   MRA b/l moderate to severe stenosis of b/l ICA siphons.   Carotid Doppler  unremarkable  2D Echo  EF 55-60%  TCD bubble study no PFO  Recommend 30 day cardiac event monitoring to rule out afib. Although young age but multiple uncontrolled risk farctors.   antiphopholipid antibodies pending  LDL 201  HgbA1c 10.0  lovenox for VTE prophylaxis  No antithrombotic prior to admission, now on aspirin 81 mg daily and clopidogrel 75 mg  daily. Continue DAPT for 3 weeks and then ASA alone.   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   ID consulted  LP challenging in pt with plavix and lovenox  Currently on empiric treatment of neurosyphilis   Diabetes type I, uncontrolled in DKA  HgbA1c 10.0 goal < 7.0  Uncontrolled  Frequent DKA admissions in the past  Currently on levemir  CBG monitoring  SSI  DM education and close PCP follow up  Hypertension Stable on the high side  Long term BP goal normotensive  On clonidine and amlodipine  Hyperlipidemia  Home meds:  none   LDL 201, goal < 70  Now on lipitor 80  Continue statin at discharge  Tobacco abuse  Current smoker  Smoking cessation counseling provided  Pt is willing to quit  Other Stroke Risk Factors  UDS - THC positive  Other Active Problems  CKD stage III - elevated Cre  Hospital day # 2  Neurology will sign off. Please call with questions. Pt will follow up with stroke clinic NP at [facility] in about 4 weeks. Thanks for the consult.  Rosalin Hawking, MD PhD Stroke Neurology 02/24/2018 3:00 PM  I spent  35 minutes in total face-to-face time with the patient, more than 50% of which was spent in counseling and coordination of care, reviewing test results, images and medication, and discussing the diagnosis of stroke in young with many risk factors, neurosyphilis, treatment plan and potential prognosis. This patient's care requiresreview of multiple databases, neurological assessment, discussion with family, other specialists and medical decision making of high complexity.       To contact Stroke Continuity provider, please refer to [email or web address] After hours, contact  General Neurology

## 2018-02-24 NOTE — Progress Notes (Signed)
    [facility] for Infectious Disease   Reason for visit: Follow up on syphillis  Interval History: on treatment  Physical Exam: Constitutional:  Vitals:   02/24/18 0507 02/24/18 1513  BP: (!) 147/94 115/78  Pulse: 81 97  Resp: 19 16  Temp: 98 F (36.7 C) 98.3 F (36.8 C)  SpO2: 100% 100%   patient appears in NAD  Impression: possible neurosyphilis with recent stroke.    Plan: 1. Continue IV penicillin for 10 days. Still needs weekly Bicillin IM 2.4 million units weekly x 3.  He has had one dose already.  Next due 6/13 and then 6/20.    We are available if needed.  thanks

## 2018-02-24 NOTE — Progress Notes (Signed)
*  PRELIMINARY RESULTS* Vascular Ultrasound Transcranial Doppler with Bubbles has been completed with Dr. Erlinda Hong. No evidence of high intensity transient signals (HITS) at rest or with Valsalva. Therefore, there is no evidence of patent foramen ovale (PFO).  02/24/2018 1:30 PM Maudry Mayhew, BS, RVT, RDCS, RDMS

## 2018-02-25 ENCOUNTER — Inpatient Hospital Stay (HOSPITAL_COMMUNITY): Payer: Medicare HMO | Admitting: Physical Therapy

## 2018-02-25 ENCOUNTER — Inpatient Hospital Stay (HOSPITAL_COMMUNITY): Payer: Medicare HMO | Admitting: Occupational Therapy

## 2018-02-25 LAB — LUPUS ANTICOAGULANT PANEL
DRVVT: 32.4 s (ref 0.0–47.0)
PTT Lupus Anticoagulant: 34.6 s (ref 0.0–51.9)

## 2018-02-25 LAB — GLUCOSE, CAPILLARY
GLUCOSE-CAPILLARY: 270 mg/dL — AB (ref 65–99)
GLUCOSE-CAPILLARY: 190 mg/dL — AB (ref 65–99)
Glucose-Capillary: 142 mg/dL — ABNORMAL HIGH (ref 65–99)
Glucose-Capillary: 130 mg/dL — ABNORMAL HIGH (ref 65–99)
Glucose-Capillary: 142 mg/dL — ABNORMAL HIGH (ref 65–99)

## 2018-02-25 NOTE — Plan of Care (Signed)
  Problem: Consults Goal: RH STROKE PATIENT EDUCATION Description See Patient Education module for education specifics  Outcome: Progressing   Problem: RH SKIN INTEGRITY Goal: RH STG SKIN FREE OF INFECTION/BREAKDOWN Description No new breakdown with Mod I assist   Outcome: Progressing   Problem: RH SAFETY Goal: RH STG ADHERE TO SAFETY PRECAUTIONS W/ASSISTANCE/DEVICE Description STG Adhere to Safety Precautions With Mod I Assistance/Device.  Outcome: Progressing   Problem: RH COGNITION-NURSING Goal: RH STG ANTICIPATES NEEDS/CALLS FOR ASSIST W/ASSIST/CUES Description STG Anticipates Needs/Calls for Assist With Mod I Assistance/Cues.  Outcome: Progressing   Problem: RH PAIN MANAGEMENT Goal: RH STG PAIN MANAGED AT OR BELOW PT'S PAIN GOAL Description < 2 out of 10.   Outcome: Progressing

## 2018-02-25 NOTE — Progress Notes (Signed)
Occupational Therapy Session Note  Patient Details  Name: Nathaniel [name] MRN: [id number] Date of Birth: 03/26/1986  Today's Date: 02/25/2018 OT Individual Time: 5638-7564 OT Individual Time Calculation (min): 71 min   Short Term Goals: Week 1:  OT Short Term Goal 1 (Week 1): Pt will complete toilet transfer with min A  OT Short Term Goal 2 (Week 1): Pt will don B shoes with supervision using AE PRN.  OT Short Term Goal 3 (Week 1): Pt will complete functional ambulate within room with CGA using LRAD. OT Short Term Goal 4 (Week 1): Pt will use R UE at non-dominant level with cuing for encouragement OT Short Term Goal 5 (Week 1): Pt will complete standing grooming task with supervision  Skilled Therapeutic Interventions/Progress Updates:    Pt greeted EOB after breakfast. No c/o pain. Tx focus on Rt NMR, functional transfers, and sit<stands during toileting, bathing, and dressing tasks at shower level. All functional stand pivot transfers completed with steady assist using grab bars or RW. After completing toileting tasks with steady assist post BM, he ambulated short distance to TTB using grab bars with Min A as well. Bathing completed with supervision while seated, with cues to use R UE at dominant level. Dressing completed at sit<stand level from w/c using RW once he gathered clothing himself, lifting large trash bag and retrieving clothing items with Rt. Cues provided for improving midline orientation/hand placement during sit<stand transitions due to Rt leaning/pushing. He opted to complete oral care/grooming tasks w/c level vs standing due to fatigue. These tasks were completed with extra time. At end of session pt was left with all needs within reach, verbalizing that he will call for staff assist for transfer needs.    Therapy Documentation Precautions:  Precautions Precautions: Fall Precaution Comments: RLE ataxia and hemiparesis Restrictions Weight Bearing Restrictions: No Vital  Signs: Therapy Vitals Pulse Rate: (!) 106 Resp: 18 BP: 135/86 Patient Position (if appropriate): Sitting Oxygen Therapy SpO2: 100 % O2 Device: Room Air Pain: Pain Assessment Pain Scale: 0-10 Pain Score: 0-No pain ADL:      See Function Navigator for Current Functional Status.   Therapy/Group: Individual Therapy  Wilburn Keir A Marilla Boddy 02/25/2018, 12:57 PM

## 2018-02-25 NOTE — Progress Notes (Signed)
Physical Therapy Session Note  Patient Details  Name: Nathaniel [name] MRN: [id number] Date of Birth: Feb 26, 1986  Today's Date: 02/25/2018 PT Individual Time: 1105-1200 AND PT Individual Time Calculation (min): 55 min   Short Term Goals: Week 1:  PT Short Term Goal 1 (Week 1): Pt will transfer with supervision.  PT Short Term Goal 2 (Week 1): Pt will ambulate 150' with supervision and LRAD.  PT Short Term Goal 3 (Week 1): Pt will negotiate 8 steps with supervision. PT Short Term Goal 4 (Week 1): Pt will complete formal balance outcome measure.   Skilled Therapeutic Interventions/Progress Updates:  Session 1.   Pt received sitting in WC and agreeable to PT  Gait training with RW x 115f with min assist from PT. Variable gait training with RW, forward/backward 2x 376feach direction. Side stepping at rail in hall 2x [id number]. Min assist from PT throughout variable gait training with min-mod cues from PT for improved heel contact, step width, and and increased foot clearance on the RLE.   Static balance training instructed by PT standing on red Wedge 5 x 10-20sec with 1-0 UE support on min-mod assist as well as moderate cues from improved use of ankle strategy to maintain balance.   Patient returned to room and left sitting in WC [facility] call bell in reach and all needs met.    Session 2.  Pt received supine in bed and agreeable to PT. Supine>sit transfer with supervision assist and min cues for use of bed features. Stand pivot transfer to WC [facility] min assist and UE support on RW.    PT instructed pt in stair negoition x 8 steps with min assist, and moderate cues for proper step to gait pattern and safety awareness for UE placement.   Gait training over unlevel surface x [id number] min assist and RW and min cues for AD management. .   Sitting balance and UE coordination task to catch and toss ball 2 x 2 minutes (underhand progressing to overhead) min cues for decreased compensatory  movements through trunk.  Static standing balance progressing to Dynamic standing without UE support with forward and lateral reaches.  2 x 2-3 min with min assist from PT and moderate cues for use of ankle strategy to prevent posterior LOB.   Biodex balance training exercise with moderate cues for use of ankle strategy to improve weight shift over the R forefoot. LOS x2, AP/lateral weight shifting 2 x 1.5 minutes with min assist and intermittent UE support.   Patient returned to room and [facility] and all needs met.          Therapy Documentation Precautions:  Precautions Precautions: Fall Precaution Comments: RLE ataxia and hemiparesis Restrictions Weight Bearing Restrictions: No Vital Signs: Therapy Vitals Temp: 98.3 F (36.8 C) Pulse Rate: (!) 103 Resp: 16 BP: 133/84 Patient Position (if appropriate): Sitting Oxygen Therapy SpO2: 100 % O2 Device: Room Air Pain: denies  See Function Navigator for Current Functional Status.   Therapy/Group: Individual Therapy  AustLorie Phenix/2019, 4:09 PM

## 2018-02-25 NOTE — Progress Notes (Signed)
[place] PHYSICAL MEDICINE & REHABILITATION     PROGRESS NOTE    Subjective/Complaints: Sugars remain labile. 260 this am with follow up pre-breakfast 425. Pt denies new complaints  ROS: Patient denies fever, rash, sore throat, blurred vision, nausea, vomiting, diarrhea, cough, shortness of breath or chest pain, joint or back pain, headache, or mood change.   Objective:  No results found. Recent Labs    02/23/18 0718  WBC 7.7  HGB 11.9*  HCT 36.7*  PLT 240   Recent Labs    02/23/18 0718 02/23/18 1318  NA 143  --   K 4.0  --   CL 106  --   GLUCOSE 78 479*  BUN 15  --   CREATININE 1.66*  --   CALCIUM 9.4  --    CBG (last 3)  Recent Labs    02/24/18 2115 02/25/18 0357 02/25/18 0624  GLUCAP 301* 142* 130*    Wt Readings from Last 3 Encounters:  02/22/18 72.3 kg (159 lb 4.8 oz)  02/17/18 72.6 kg (160 lb)  10/23/16 67.2 kg (148 lb 2.4 oz)     Intake/Output Summary (Last 24 hours) at 02/25/2018 0826 Last data filed at 02/25/2018 0700 Gross per 24 hour  Intake 480 ml  Output 1800 ml  Net -1320 ml    Vital Signs: Blood pressure 133/85, pulse 87, temperature 98.1 F (36.7 C), resp. rate 18, height 6\' 2"  (1.88 m), weight 72.3 kg (159 lb 4.8 oz), SpO2 100 %. Physical Exam:  Constitutional: No distress . Vital signs reviewed. HEENT: EOMI, oral membranes moist Neck: supple Cardiovascular: RRR without murmur. No JVD    Respiratory: CTA Bilaterally without wheezes or rales. Normal effort    GI: BS +, non-tender, non-distended   Musculoskeletal:  No edema or tenderness in extremities  Neurological: He is alert and oriented to person, place, and time.  Motor: Right upper extremity/right lower extremity: 4+/5 proximal to distal with decreased [place].  LUE and LLE: 5/5 proximal to distal Sensation diminished to light touch right upper extremity. RLE generally normal Skin: Skin is warm and dry.  Psychiatric: He has a normal mood and affect. His behavior is normal.      Assessment/Plan: 1. 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 Physiatrist is providing close team supervision and 24 hour management of active medical problems listed below. Physiatrist and rehab team continue to assess barriers to discharge/monitor patient progress toward functional and medical goals.  Function:  Bathing Bathing position   Position: Shower  Bathing parts Body parts bathed by patient: Right arm, Right lower leg, Left arm, Chest, Left lower leg, Back, Abdomen, Buttocks, Right upper leg, Left upper leg, Front perineal area    Bathing assist Assist Level: Touching or steadying assistance(Pt > 75%)      Upper Body Dressing/Undressing Upper body dressing   What is the patient wearing?: Pull over shirt/dress     Pull over shirt/dress - Perfomed by patient: Thread/unthread right sleeve, Thread/unthread left sleeve, Put head through opening Pull over shirt/dress - Perfomed by helper: Pull shirt over trunk        Upper body assist        Lower Body Dressing/Undressing Lower body dressing   What is the patient wearing?: Underwear, Pants, Socks, Shoes Underwear - Performed by patient: Thread/unthread right underwear leg, Thread/unthread left underwear leg, Pull underwear up/down   Pants- Performed by patient: Thread/unthread right pants  leg, Thread/unthread left pants leg, Pull pants up/down         Socks - Performed by helper: Don/doff right sock, Don/doff left sock   Shoes - Performed by helper: Don/doff right shoe, Don/doff left shoe, Fasten right, Fasten left          Lower body assist Assist for lower body dressing: Touching or steadying assistance (Pt > 75%)      Toileting Toileting   Toileting steps completed by patient: Adjust clothing prior to toileting, Performs perineal hygiene, Adjust clothing after toileting Toileting steps  completed by helper: Adjust clothing after toileting Toileting Assistive Devices: Grab bar or rail  Toileting assist Assist level: Touching or steadying assistance (Pt.75%)   Transfers Chair/bed transfer   Chair/bed transfer method: Squat pivot Chair/bed transfer assist level: Supervision or verbal cues Chair/bed transfer assistive device: Armrests, Medical sales representative     Max distance: 10' Assist level: Touching or steadying assistance (Pt > 75%)   Wheelchair          Cognition Comprehension Comprehension assist level: Follows basic conversation/direction with no assist  Expression Expression assist level: Expresses basic needs/ideas: With no assist  Social Interaction Social Interaction assist level: Interacts appropriately with others - No medications needed.  Problem Solving Problem solving assist level: Solves complex problems: With extra time  Memory Memory assist level: Complete Independence: No helper  Medical Problem List and Plan: 1.  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   -continue therapies  -appreciate neurology work up and recs--follow up in 4 weeks   -DAPT for 3 weeks then ASA alone   -30 day cardiac monitoring recommended 2.  DVT Prophylaxis/Anticoagulation: Pharmaceutical: Lovenox 3. Pain Management: N/A 4. Mood: LCSW to follow for evaluation and support.  5. Neuropsych: This patient is capable of making decisions on his own behalf. 6. Skin/Wound Care: routine pressure relief measures.  7. Fluids/Electrolytes/Nutrition: Monitor I/O. Check lytes in am.  8. T1DM with DKA:  Multiple episodes of DKA with Hgb A1C- 10. Question compliance.    -some improvement overnight-   -appreciate DM coordinator input  -  mealtime covg 4u, TID with meals  - levemir 6u BID. May increase evening dose to 8-10 u based on how sugars look today  -continue current regimen and observe 9. HTN: borderline control, labile     -  CKD   l  -recheck bmet Monday 10. Dyslipidemia: On lipitor 11. Leucocytosis: afebrile, wbc'x 7.7 12. Acute on chronic renal failure?: Encourage fluid intake.  13. Positive RPR/T Pallidum: Appreciate ID  input  -given risks of LP ID recs treatment   - continue IV Pen G q4 for 10 days total (6/6)   -Weekly Bicillin IM x3 doses.  He is due again 6/13 and 6/20  LOS (Days) 3 A FACE TO FACE EVALUATION WAS PERFORMED  Meredith Staggers, MD 02/25/2018 8:26 AM

## 2018-02-26 ENCOUNTER — Inpatient Hospital Stay (HOSPITAL_COMMUNITY): Payer: Medicare HMO

## 2018-02-26 ENCOUNTER — Inpatient Hospital Stay (HOSPITAL_COMMUNITY): Payer: Medicare HMO | Admitting: Occupational Therapy

## 2018-02-26 LAB — GLUCOSE, CAPILLARY
GLUCOSE-CAPILLARY: 65 mg/dL (ref 65–99)
GLUCOSE-CAPILLARY: 475 mg/dL — AB (ref 65–99)
Glucose-Capillary: 177 mg/dL — ABNORMAL HIGH (ref 65–99)
Glucose-Capillary: 137 mg/dL — ABNORMAL HIGH (ref 65–99)
Glucose-Capillary: 46 mg/dL — ABNORMAL LOW (ref 65–99)
Glucose-Capillary: 497 mg/dL — ABNORMAL HIGH (ref 65–99)

## 2018-02-26 NOTE — Progress Notes (Signed)
[place] PHYSICAL MEDICINE & REHABILITATION     PROGRESS NOTE    Subjective/Complaints: Overall patient states that he is happy with his progress.  Feels that his sugars have been improving also  ROS: Patient denies fever, rash, sore throat, blurred vision, nausea, vomiting, diarrhea, cough, shortness of breath or chest pain, joint or back pain, headache, or mood change.   Objective:  No results found. No results for input(s): WBC, HGB, HCT, PLT in the last 72 hours. Recent Labs    02/23/18 1318  GLUCOSE 479*   CBG (last 3)  Recent Labs    02/25/18 2125 02/26/18 0455 02/26/18 0655  GLUCAP 190* 177* 137*    Wt Readings from Last 3 Encounters:  02/22/18 72.3 kg (159 lb 4.8 oz)  02/17/18 72.6 kg (160 lb)  10/23/16 67.2 kg (148 lb 2.4 oz)     Intake/Output Summary (Last 24 hours) at 02/26/2018 0811 Last data filed at 02/26/2018 0600 Gross per 24 hour  Intake 840 ml  Output 1400 ml  Net -560 ml    Vital Signs: Blood pressure (!) 154/96, pulse 74, temperature 97.9 F (36.6 C), temperature source Oral, resp. rate 18, height 6\' 2"  (1.88 m), weight 72.3 kg (159 lb 4.8 oz), SpO2 100 %. Physical Exam:  Constitutional: No distress . Vital signs reviewed. HEENT: EOMI, oral membranes moist Neck: supple Cardiovascular: RRR without murmur. No JVD    Respiratory: CTA Bilaterally without wheezes or rales. Normal effort    GI: BS +, non-tender, non-distended  Musculoskeletal:  No edema or tenderness in extremities  Neurological: He is alert and oriented to person, place, and time.  Motor: Right upper extremity/right lower extremity: 4+/5 proximal to distal with decreased [place]. --Stable exam LUE and LLE: 5/5 proximal to distal Sensation diminished to light touch right upper extremity. RLE generally normal Skin: Skin is warm and dry.  Psychiatric: He has a normal mood and affect. His behavior is normal.     Assessment/Plan: 1. 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 Physiatrist is providing close team supervision and 24 hour management of active medical problems listed below. Physiatrist and rehab team continue to assess barriers to discharge/monitor patient progress toward functional and medical goals.  Function:  Bathing Bathing position   Position: Shower  Bathing parts Body parts bathed by patient: Right arm, Right lower leg, Left arm, Chest, Left lower leg, Back, Abdomen, Buttocks, Right upper leg, Left upper leg, Front perineal area    Bathing assist Assist Level: Supervision or verbal cues      Upper Body Dressing/Undressing Upper body dressing   What is the patient wearing?: Pull over shirt/dress     Pull over shirt/dress - Perfomed by patient: Thread/unthread right sleeve, Thread/unthread left sleeve, Put head through opening Pull over shirt/dress - Perfomed by helper: Pull shirt over trunk        Upper body assist        Lower Body Dressing/Undressing Lower body dressing   What is the patient wearing?: Underwear, Pants, Non-skid slipper socks Underwear - Performed by patient: Thread/unthread right underwear leg, Thread/unthread left underwear leg, Pull underwear up/down   Pants- Performed by patient: Thread/unthread right pants leg, Thread/unthread left pants leg, Pull pants up/down   Non-skid slipper socks- Performed by patient: Don/doff right sock, Don/doff left sock     Socks - Performed by helper: Don/doff right sock, Don/doff left sock   Shoes -  Performed by helper: Don/doff right shoe, Don/doff left shoe, Fasten right, Fasten left          Lower body assist Assist for lower body dressing: Touching or steadying assistance (Pt > 75%)      Toileting Toileting Toileting activity did not occur: No continent bowel/bladder event Toileting steps completed by patient: Adjust clothing prior to toileting, Performs  perineal hygiene(no clothing to adjust post toileting) Toileting steps completed by helper: Adjust clothing after toileting Toileting Assistive Devices: Grab bar or rail  Toileting assist Assist level: Touching or steadying assistance (Pt.75%)   Transfers Chair/bed transfer   Chair/bed transfer method: Stand pivot Chair/bed transfer assist level: Touching or steadying assistance (Pt > 75%) Chair/bed transfer assistive device: Armrests, Medical sales representative     Max distance: 119ft  Assist level: Touching or steadying assistance (Pt > 75%)   Wheelchair          Cognition Comprehension Comprehension assist level: Follows basic conversation/direction with no assist  Expression Expression assist level: Expresses basic needs/ideas: With no assist  Social Interaction Social Interaction assist level: Interacts appropriately with others - No medications needed.  Problem Solving Problem solving assist level: Solves complex problems: With extra time  Memory Memory assist level: Complete Independence: No helper  Medical Problem List and Plan: 1.  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   -continue therapies  -appreciate neurology work up and recs--follow up in 4 weeks   -DAPT for 3 weeks then ASA alone   -30 day cardiac monitoring recommended 2.  DVT Prophylaxis/Anticoagulation: Pharmaceutical: Lovenox 3. Pain Management: N/A 4. Mood: LCSW to follow for evaluation and support.  5. Neuropsych: This patient is capable of making decisions on his own behalf. 6. Skin/Wound Care: routine pressure relief measures.  7. Fluids/Electrolytes/Nutrition: Monitor I/O. Check lytes in am.  8. T1DM with DKA:  Multiple episodes of DKA with Hgb A1C- 10.       -Sugars improving   -Have really stressed the importance of diet  -  mealtime covg 4u, TID with meals  - levemir 10u BID.    -continue current regimen and observe 9. HTN: borderline control, labile      -  CKD  l  -recheck bmet Monday 10. Dyslipidemia: On lipitor 11. Leucocytosis: afebrile, wbc'x 7.7 12. Acute on chronic renal failure?: Encourage fluid intake.  13. Positive RPR/T Pallidum: Appreciate ID  input  -given risks of LP ID recs treatment   - continue IV Pen G q4 for 10 days total (6/6)   -Weekly Bicillin IM x3 doses.  He is due again 6/13 and 6/20  LOS (Days) 4 A FACE TO FACE EVALUATION WAS PERFORMED  Meredith Staggers, MD 02/26/2018 8:11 AM

## 2018-02-26 NOTE — Progress Notes (Signed)
Physical Therapy Session Note  Patient Details  Name: Nathaniel [name] MRN: [id number] Date of Birth: 11-14-1985  Today's Date: 02/26/2018 PT Individual Time: 1410-1450 PT Individual Time Calculation (min): 40 min   Short Term Goals: Week 1:  PT Short Term Goal 1 (Week 1): Pt will transfer with supervision.  PT Short Term Goal 2 (Week 1): Pt will ambulate 150' with supervision and LRAD.  PT Short Term Goal 3 (Week 1): Pt will negotiate 8 steps with supervision. PT Short Term Goal 4 (Week 1): Pt will complete formal balance outcome measure.   Skilled Therapeutic Interventions/Progress Updates:   Pt in bed, ready to start session.  Upon sitting up on EOB pt stated that he was dizzy, and waited about 2 minutes before attempting to stand.  Pt attempted to pull up on RW unsafely; multiple cues for pushing up with at least 1 hand.  When sitting down, pt forgot to reach back for w/c, and drifted R, requiring assistance for wt shifting to midline. Pt needed cues throughout session for hand placemment.  neuromuscular re-education via forced use, multimodal cues for bil LE alternating reciprocal movement seated in w/c, using Kinetron at 30 cm/sec x 20 cycles x 1; using RLE unilaterally at 50 cm/sec x 20 cycles, without use of UEs.  PT instructed and assisted pt in seated R hamstring and heel cord stretch, x 30 seconds x 3. Balance challenge and sustained stretch bil heel cords and hamstrings in standing on blue wedge, with bil UE support on RW, and multimodal cues for hip extension, back extension and reduced reliance on bil UEs.  Sit> stand pushing up with bil hands, with cues for forward wt shift was effective in his maintaining midline orientation.   Gait training with RW on level tile, min guard> min assist, with mod cues for upright trunk, hip extension, forward gaze and increased velocity and fluidity of movement.   Pt left resting in w/c with seat alarm set and all needs within reach.     Therapy Documentation Precautions:  Precautions Precautions: Fall Precaution Comments: RLE ataxia and hemiparesis Restrictions Weight Bearing Restrictions: No   Pain: none per pt     See Function Navigator for Current Functional Status.   Therapy/Group: Individual Therapy  Anjel Pardo 02/26/2018, 3:06 PM

## 2018-02-26 NOTE — Significant Event (Signed)
Hypoglycemic Event  CBG: 46  Treatment: 15 GM carbohydrate snack  Symptoms: Hungry  Follow-up CBG: Time:1222 CBG Result:65  Possible Reasons for Event: Medication regimen: levemir, sliding scale, meal coverage  Comments/MD notified:MD notified. Orders given not to continue rechecking and to treat moderately to avoid hyperglycemia.     Barrett Shell

## 2018-02-26 NOTE — Progress Notes (Addendum)
Occupational Therapy Session Note  Patient Details  Name: Nathaniel [name] MRN: [id number] Date of Birth: Apr 18, 1986  Today's Date: 02/26/2018 OT Individual Time: 0905-1002 OT Individual Time Calculation (min): 57 min   Short Term Goals: Week 1:  OT Short Term Goal 1 (Week 1): Pt will complete toilet transfer with min A  OT Short Term Goal 2 (Week 1): Pt will don B shoes with supervision using AE PRN.  OT Short Term Goal 3 (Week 1): Pt will complete functional ambulate within room with CGA using LRAD. OT Short Term Goal 4 (Week 1): Pt will use R UE at non-dominant level with cuing for encouragement OT Short Term Goal 5 (Week 1): Pt will complete standing grooming task with supervision  Skilled Therapeutic Interventions/Progress Updates:    Pt greeted supine in bed. Opting to wait until afternoon for shower (with staff) when mom arrives with new clothes. Squat pivot<w/c with steady assist. After completing grooming oral care tasks w/c level at sink (declined standing), he self propelled to dayroom for bilateral UE strengthening. For remainder of session, pt stood at elevated table while engaged in [employer]. Tx focus on standing tolerance and R UE coordination when manipulating small tile pieces. Mod vcs throughout for decreasing compensatory strategies with Lt. He relies on visual feedback for FM tasks due to sensory deficits. Unable to retrieve tiles from small bag with vision occluded, and therefore OT placed a handful of tiles on the table for him to select from as needed. Able to stand for 15 minute windows before requiring seated rest. Afterwards he self propelled back to room and transferred to bed via squat pivot with steady assist. Pt was left in bed with all needs within reach and bed alarm set.   Therapy Documentation Precautions:  Precautions Precautions: Fall Precaution Comments: RLE ataxia and hemiparesis Restrictions Weight Bearing Restrictions: No Vital Signs: Therapy  Vitals BP: (!) 134/92 Pain: No c/o pain during session    ADL:      See Function Navigator for Current Functional Status.   Therapy/Group: Individual Therapy  Nathaniel [name] 02/26/2018, 12:36 PM

## 2018-02-26 NOTE — Progress Notes (Signed)
Patient's blood sugar 475. MD notified. Orders given not to take Stat CBG. Reporting to oncoming RN for monitoring

## 2018-02-27 ENCOUNTER — Inpatient Hospital Stay (HOSPITAL_COMMUNITY): Payer: Medicare HMO

## 2018-02-27 ENCOUNTER — Encounter (HOSPITAL_COMMUNITY): Payer: Medicare HMO | Admitting: Psychology

## 2018-02-27 ENCOUNTER — Inpatient Hospital Stay (HOSPITAL_COMMUNITY): Payer: Medicare HMO | Admitting: Occupational Therapy

## 2018-02-27 ENCOUNTER — Inpatient Hospital Stay (HOSPITAL_COMMUNITY): Payer: Medicare HMO | Admitting: Physical Therapy

## 2018-02-27 DIAGNOSIS — N183 Chronic kidney disease, stage 3 (moderate): Secondary | ICD-10-CM

## 2018-02-27 LAB — CBC
HCT: 30.7 % — ABNORMAL LOW (ref 39.0–52.0)
HEMOGLOBIN: 9.9 g/dL — AB (ref 13.0–17.0)
MCH: 28.6 pg (ref 26.0–34.0)
MCHC: 32.2 g/dL (ref 30.0–36.0)
MCV: 88.7 fL (ref 78.0–100.0)
PLATELETS: 250 10*3/uL (ref 150–400)
RBC: 3.46 MIL/uL — AB (ref 4.22–5.81)
RDW: 13.9 % (ref 11.5–15.5)
WBC: 9.3 10*3/uL (ref 4.0–10.5)

## 2018-02-27 LAB — CARDIOLIPIN ANTIBODIES, IGG, IGM, IGA
Anticardiolipin IgA: <9 APL U/mL (ref 0–11)
Anticardiolipin IgG: <9 GPL U/mL (ref 0–14)
Anticardiolipin IgM: 57 MPL U/mL — ABNORMAL HIGH (ref 0–12)

## 2018-02-27 LAB — GLUCOSE, CAPILLARY
GLUCOSE-CAPILLARY: 179 mg/dL — AB (ref 65–99)
GLUCOSE-CAPILLARY: 70 mg/dL (ref 65–99)
Glucose-Capillary: 152 mg/dL — ABNORMAL HIGH (ref 65–99)
Glucose-Capillary: 39 mg/dL — CL (ref 65–99)
Glucose-Capillary: 371 mg/dL — ABNORMAL HIGH (ref 65–99)
Glucose-Capillary: 379 mg/dL — ABNORMAL HIGH (ref 65–99)

## 2018-02-27 LAB — BASIC METABOLIC PANEL
ANION GAP: 8 (ref 5–15)
BUN: 27 mg/dL — ABNORMAL HIGH (ref 6–20)
CALCIUM: 8.9 mg/dL (ref 8.9–10.3)
CHLORIDE: 107 mmol/L (ref 101–111)
CO2: 24 mmol/L (ref 22–32)
CREATININE: 1.78 mg/dL — AB (ref 0.61–1.24)
GFR calc non Af Amer: 49 mL/min — ABNORMAL LOW (ref >60)
GFR, EST AFRICAN AMERICAN: 57 mL/min — AB (ref >60)
Glucose, Bld: 196 mg/dL — ABNORMAL HIGH (ref 65–99)
Potassium: 3.9 mmol/L (ref 3.5–5.1)
SODIUM: 139 mmol/L (ref 135–145)

## 2018-02-27 LAB — BETA-2-GLYCOPROTEIN I ABS, IGG/M/A
BETA-2-GLYCOPROTEIN I IGA: 98 GPI IgA units — AB (ref 0–25)
Beta-2 Glyco I IgG: 36 GPI IgG units — ABNORMAL HIGH (ref 0–20)
Beta-2-Glycoprotein I IgM: <9 GPI IgM units (ref 0–32)

## 2018-02-27 MED ORDER — INSULIN ASPART 100 UNIT/ML ~~LOC~~ SOLN
0.0000 [IU] | Freq: Three times a day (TID) | SUBCUTANEOUS | Status: DC
  Administered 2018-02-27: 9 [IU] via SUBCUTANEOUS
  Administered 2018-02-28: 1 [IU] via SUBCUTANEOUS
  Administered 2018-02-28 (×3): 3 [IU] via SUBCUTANEOUS
  Administered 2018-03-01: 2 [IU] via SUBCUTANEOUS
  Administered 2018-03-01: 7 [IU] via SUBCUTANEOUS
  Administered 2018-03-01: 3 [IU] via SUBCUTANEOUS
  Administered 2018-03-01: 2 [IU] via SUBCUTANEOUS
  Administered 2018-03-02: 5 [IU] via SUBCUTANEOUS
  Administered 2018-03-02: 2 [IU] via SUBCUTANEOUS
  Administered 2018-03-02: 3 [IU] via SUBCUTANEOUS
  Administered 2018-03-03 (×2): 5 [IU] via SUBCUTANEOUS
  Administered 2018-03-03: 2 [IU] via SUBCUTANEOUS
  Administered 2018-03-03: 5 [IU] via SUBCUTANEOUS
  Administered 2018-03-04: 3 [IU] via SUBCUTANEOUS
  Administered 2018-03-04 – 2018-03-05 (×3): 9 [IU] via SUBCUTANEOUS
  Administered 2018-03-06: 3 [IU] via SUBCUTANEOUS
  Administered 2018-03-06: 7 [IU] via SUBCUTANEOUS
  Administered 2018-03-07 (×2): 9 [IU] via SUBCUTANEOUS
  Administered 2018-03-07: 2 [IU] via SUBCUTANEOUS
  Administered 2018-03-07: 7 [IU] via SUBCUTANEOUS
  Administered 2018-03-08 (×2): 5 [IU] via SUBCUTANEOUS

## 2018-02-27 MED ORDER — INSULIN ASPART 100 UNIT/ML ~~LOC~~ SOLN
4.0000 [IU] | Freq: Two times a day (BID) | SUBCUTANEOUS | Status: DC
  Administered 2018-02-27 – 2018-03-08 (×18): 4 [IU] via SUBCUTANEOUS

## 2018-02-27 MED ORDER — INSULIN DETEMIR 100 UNIT/ML ~~LOC~~ SOLN
12.0000 [IU] | Freq: Two times a day (BID) | SUBCUTANEOUS | Status: DC
  Administered 2018-02-27 – 2018-03-02 (×6): 12 [IU] via SUBCUTANEOUS
  Filled 2018-02-27 (×7): qty 0.12

## 2018-02-27 NOTE — Progress Notes (Signed)
At HS CBG=497. Patient ate seafood plate along with mac n cheese that family brought in. Paged Dr. Naaman Plummer R/T CBG. Orders to follow SSI sensitive scale that's ordered, give 9units of Novolog. Spoke at length R/T diet. "Ok, I'm finished eating tonight." Continent B&B, using urinal. Denies pain. [name]

## 2018-02-27 NOTE — Progress Notes (Signed)
Physical Therapy Session Note  Patient Details  Name: Nathaniel [name] MRN: [id number] Date of Birth: 1986/02/24  Today's Date: 02/27/2018 PT Individual Time: 0905-1000 PT Individual Time Calculation (min): 55 min   Short Term Goals: Week 1:  PT Short Term Goal 1 (Week 1): Pt will transfer with supervision.  PT Short Term Goal 2 (Week 1): Pt will ambulate 150' with supervision and LRAD.  PT Short Term Goal 3 (Week 1): Pt will negotiate 8 steps with supervision. PT Short Term Goal 4 (Week 1): Pt will complete formal balance outcome measure.   Skilled Therapeutic Interventions/Progress Updates: Pt presented sitting EOB with nsg present agreeable to therapy. Pt donned shoes with minA with pt donning R shoe in figure four position. Performed stand pivot transfer with minA and increased time as pt was able to verbalize sequencing prior to performing activity. Pt transported to rehab gym for time management and participated in [place] via forced use. Pt performed toe taps to target with RLE onto 6in step. Pt able to place correctly on step however required increased time returning foot to floor, PTA placed marked target on floor and pt improved foot placement with decreased time. Performed standing clothes pin placement onto basketball net with RLE using blue/green/black clothespins. Pt noted to increase wt shift to R with fatigue with pt unaware but able to correct with verbal cues. Second and third trial pt performed with contralateral and ipsilateral support of LE on 4in step with use of RW and no LOB. Pt performed sit to/from stand with BUE on armrests x 4 throughout balance activities initially min assist fading to min guard. Pt transported to day room and participated in NuStep L2 x 6 min for endurance and reciprocal movement with good tolerance.  Performed stand pivot to/from w/c with min guard and increased time. Pt returned to room and remained in w/c with call bell within reach and needs met.       Therapy Documentation Precautions:  Precautions Precautions: Fall Precaution Comments: RLE ataxia and hemiparesis Restrictions Weight Bearing Restrictions: No  See Function Navigator for Current Functional Status.   Therapy/Group: Individual Therapy  Yitty Roads  Greyden Besecker, PTA  02/27/2018, 12:18 PM

## 2018-02-27 NOTE — Progress Notes (Signed)
Hypoglycemic Event  CBG: 39  Treatment: 15 GM carbohydrate snack  Symptoms: None  Follow-up CBG: Time:1235 CBG Result:70  Possible Reasons for Event: Change in activity  Comments/MD notified: Pam PA- requesting to get rid of meal coverage for breakfast.    Claude Manges

## 2018-02-27 NOTE — Progress Notes (Addendum)
Physical Therapy Session Note  Patient Details  Name: Nathaniel [name] MRN: [id number] Date of Birth: Sep 21, 1985  Today's Date: 02/27/2018 PT Individual Time: 1015-1105 PT Individual Time Calculation (min): 50 min   Short Term Goals: Week 1:  PT Short Term Goal 1 (Week 1): Pt will transfer with supervision.  PT Short Term Goal 2 (Week 1): Pt will ambulate 150' with supervision and LRAD.  PT Short Term Goal 3 (Week 1): Pt will negotiate 8 steps with supervision. PT Short Term Goal 4 (Week 1): Pt will complete formal balance outcome measure.   Skilled Therapeutic Interventions/Progress Updates:  W/c propulsion using bil UEs or bil LEs for neuro re-ed R extemities. Stand pivot with RW w/c> mat with min guard, mod cues.  Sitting edge of mat, reciprocal scooting with multimodal cues and demo for activation of R pelvic muscles. Manual cues to facilitate r pelvic protraction.  Sustained stretch and balance challenge standing on wedge, focusing on hip and back extension x 103minute. R pelvic retraction still noted in standing.  When standing up, pt benefits from pushing up with bil hands to load RLE, and when sitting down, pt benefits from looking back to L and reaching back with L hand to ensure that hips do not drift to R.   Extensive education with pt regarding use of his vision to check position of RLe during sit>< stand, since his sensation is so poor.   With external perturbations in standing, pt demonstrates minimal L ankle strategy, absent R ankle strategy, bil hip strategy and bil stepping strategy.  With manual cues and resistance, bil hip strategy emerged, but is slow and inadequate.   Pt passed to next therapist, Amy, OT.     Therapy Documentation Precautions:  Precautions Precautions: Fall Precaution Comments: RLE ataxia and hemiparesis Restrictions Weight Bearing Restrictions: No   Pain: none per pt      See Function Navigator for Current Functional  Status.   Therapy/Group: Individual Therapy  [name] 02/27/2018, 2:44 PM

## 2018-02-27 NOTE — Plan of Care (Signed)
  Problem: Consults Goal: RH STROKE PATIENT EDUCATION Description See Patient Education module for education specifics  Outcome: Progressing   Problem: RH SKIN INTEGRITY Goal: RH STG SKIN FREE OF INFECTION/BREAKDOWN Description No new breakdown with Mod I assist   Outcome: Progressing   Problem: RH SAFETY Goal: RH STG ADHERE TO SAFETY PRECAUTIONS W/ASSISTANCE/DEVICE Description STG Adhere to Safety Precautions With Mod I Assistance/Device.  Outcome: Progressing   Problem: RH COGNITION-NURSING Goal: RH STG ANTICIPATES NEEDS/CALLS FOR ASSIST W/ASSIST/CUES Description STG Anticipates Needs/Calls for Assist With Mod I Assistance/Cues.  Outcome: Progressing   Problem: RH PAIN MANAGEMENT Goal: RH STG PAIN MANAGED AT OR BELOW PT'S PAIN GOAL Description < 2 out of 10.   Outcome: Progressing

## 2018-02-27 NOTE — Progress Notes (Signed)
Pt family bringing pt fast food and foods not appropriate for diet. Pt educated. Claude Manges, LPN

## 2018-02-27 NOTE — Progress Notes (Signed)
[facility] PHYSICAL MEDICINE & REHABILITATION     PROGRESS NOTE    Subjective/Complaints: Sugars remain labile.  Nurse told me last evening that the patient's family brought in all kinds of inappropriate food for him which he ate which drove his blood sugar up to 470.  ROS: Patient denies fever, rash, sore throat, blurred vision, nausea, vomiting, diarrhea, cough, shortness of breath or chest pain, joint or back pain, headache, or mood change.   Objective:  No results found. Recent Labs    02/27/18 0513  WBC 9.3  HGB 9.9*  HCT 30.7*  PLT 250   Recent Labs    02/27/18 0513  NA 139  K 3.9  CL 107  GLUCOSE 196*  BUN 27*  CREATININE 1.78*  CALCIUM 8.9   CBG (last 3)  Recent Labs    02/26/18 2115 02/27/18 0408 02/27/18 0627  GLUCAP 497* 179* 152*    Wt Readings from Last 3 Encounters:  02/22/18 72.3 kg (159 lb 4.8 oz)  02/17/18 72.6 kg (160 lb)  10/23/16 67.2 kg (148 lb 2.4 oz)     Intake/Output Summary (Last 24 hours) at 02/27/2018 1029 Last data filed at 02/27/2018 0858 Gross per 24 hour  Intake 2040 ml  Output 2825 ml  Net -785 ml    Vital Signs: Blood pressure (!) 138/99, pulse 83, temperature 98.4 F (36.9 C), temperature source Oral, resp. rate 15, height 6\' 2"  (1.88 m), weight 72.3 kg (159 lb 4.8 oz), SpO2 100 %. Physical Exam:  Constitutional: No distress . Vital signs reviewed. HEENT: EOMI, oral membranes moist Neck: supple Cardiovascular: RRR without murmur. No JVD    Respiratory: CTA Bilaterally without wheezes or rales. Normal effort    GI: BS +, non-tender, non-distended  Musculoskeletal:  No edema or tenderness in extremities  Neurological: He is alert and oriented to person, place, and time.  Motor: Right upper extremity/right lower extremity: 4+/5 proximal to distal with decreased [place]. Stable.  LUE and LLE: 5/5 proximal to distal Sensation diminished to light touch right upper extremity.  Skin: Skin is warm and dry.  Psychiatric: He  has a normal mood and affect. His behavior is normal.     Assessment/Plan: 1. 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 Physiatrist is providing close team supervision and 24 hour management of active medical problems listed below. Physiatrist and rehab team continue to assess barriers to discharge/monitor patient progress toward functional and medical goals.  Function:  Bathing Bathing position   Position: Shower  Bathing parts Body parts bathed by patient: Right arm, Right lower leg, Left arm, Chest, Left lower leg, Back, Abdomen, Buttocks, Right upper leg, Left upper leg, Front perineal area    Bathing assist Assist Level: Supervision or verbal cues      Upper Body Dressing/Undressing Upper body dressing   What is the patient wearing?: Pull over shirt/dress     Pull over shirt/dress - Perfomed by patient: Thread/unthread right sleeve, Thread/unthread left sleeve, Put head through opening Pull over shirt/dress - Perfomed by helper: Pull shirt over trunk        Upper body assist        Lower Body Dressing/Undressing Lower body dressing   What is the patient wearing?: Underwear, Pants, Non-skid slipper socks Underwear - Performed by patient: Thread/unthread right underwear leg, Thread/unthread left underwear leg, Pull underwear up/down   Pants- Performed by patient: Thread/unthread right pants leg, Thread/unthread  left pants leg, Pull pants up/down   Non-skid slipper socks- Performed by patient: Don/doff right sock, Don/doff left sock     Socks - Performed by helper: Don/doff right sock, Don/doff left sock   Shoes - Performed by helper: Don/doff right shoe, Don/doff left shoe, Fasten right, Fasten left          Lower body assist Assist for lower body dressing: Touching or steadying assistance (Pt > 75%)      Toileting Toileting Toileting activity did  not occur: No continent bowel/bladder event Toileting steps completed by patient: Adjust clothing prior to toileting, Performs perineal hygiene Toileting steps completed by helper: Adjust clothing after toileting Toileting Assistive Devices: Grab bar or rail  Toileting assist Assist level: Supervision or verbal cues   Transfers Chair/bed transfer   Chair/bed transfer method: Stand pivot Chair/bed transfer assist level: Touching or steadying assistance (Pt > 75%) Chair/bed transfer assistive device: Armrests, Medical sales representative     Max distance: 100 Assist level: Touching or steadying assistance (Pt > 75%)   Wheelchair          Cognition Comprehension Comprehension assist level: Follows basic conversation/direction with no assist  Expression Expression assist level: Expresses basic needs/ideas: With no assist  Social Interaction Social Interaction assist level: Interacts appropriately with others - No medications needed.  Problem Solving Problem solving assist level: Solves complex problems: With extra time  Memory Memory assist level: Complete Independence: No helper  Medical Problem List and Plan: 1.  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   -continue therapies  -appreciate neurology work up and recs--follow up in 4 weeks   -DAPT for 3 weeks then ASA alone   -30 day cardiac monitoring recommended 2.  DVT Prophylaxis/Anticoagulation: Pharmaceutical: Lovenox 3. Pain Management: N/A 4. Mood: LCSW to follow for evaluation and support.  5. Neuropsych: This patient is capable of making decisions on his own behalf. 6. Skin/Wound Care: routine pressure relief measures.  7. Fluids/Electrolytes/Nutrition: Monitor I/O. Check lytes in am.  8. T1DM with DKA:  Multiple episodes of DKA with Hgb A1C- 10.       -Sugars improving   -Have  stressed the importance of diet to no avail  -  mealtime covg 4u, TID with meals  - increase levemir 12u  BID.     9. HTN: borderline control, labile     -  CKD  l  -BUN/Cr increased---push fluids  -follow up again tomorrow  10. Dyslipidemia: On lipitor 11. Leucocytosis: afebrile, wbc'x 7.7 12. Acute on chronic renal failure?: Encourage fluid intake.  13. Positive RPR/T Pallidum: Appreciate ID  input  -given risks of LP ID recs treatment   - continue IV Pen G q4 for 10 days total (6/6)   -Weekly Bicillin IM x3 doses.  He is due again 6/13 and 6/20  LOS (Days) 5 A FACE TO FACE EVALUATION WAS PERFORMED  Meredith Staggers, MD 02/27/2018 10:29 AM

## 2018-02-27 NOTE — Progress Notes (Signed)
Occupational Therapy Session Note  Patient Details  Name: Nathaniel [name] MRN: [id number] Date of Birth: 07-31-1986  Today's Date: 02/27/2018 OT Individual Time: 1105-1205 OT Individual Time Calculation (min): 60 min    Short Term Goals: Week 1:  OT Short Term Goal 1 (Week 1): Pt will complete toilet transfer with min A  OT Short Term Goal 2 (Week 1): Pt will don B shoes with supervision using AE PRN.  OT Short Term Goal 3 (Week 1): Pt will complete functional ambulate within room with CGA using LRAD. OT Short Term Goal 4 (Week 1): Pt will use R UE at non-dominant level with cuing for encouragement OT Short Term Goal 5 (Week 1): Pt will complete standing grooming task with supervision  Skilled Therapeutic Interventions/Progress Updates:    Pt seen for OT session focusing on neuro re-ed with functional ambulation and transfers. Pt received sitting in w/c with hand off from PT. Pt agreeable to tx session and denying pain.  Short distance ambulation trials with 3# ankle weight placed on R ankle for increased proprioceptive input and facilitate clearance of L LE during step once weight removed. Following x3 ambulation trials with weight, removed weight with excellent results of increased R foot clearance during step and increased accuracy of placement during steps. Multimodal cuing provided throughout ambulation to normalize walking pattern as well as mirror for visual feedback. Pt voiced feeling difference and carry over with ankle weight vs not. Completed x12 seated anterior weight shifts with chair placed in front of pt to decrease risk of fall focusing on "nose over toes" for functional sit>stand. Pt ambulated back to room at end of session using RW with min A requiring significantly increased time due to slow ambulation speed secondary to weakness and ataxia.  Pt left seated EOB at end of session, set-up with meal tray and all needs in reach. Bed alarm on  Therapy  Documentation Precautions:  Precautions Precautions: Fall Precaution Comments: RLE ataxia and hemiparesis Restrictions Weight Bearing Restrictions: No Pain:   No/denies pain  See Function Navigator for Current Functional Status.   Therapy/Group: Individual Therapy  Zaleigh Bermingham L 02/27/2018, 12:43 PM

## 2018-02-27 NOTE — Progress Notes (Signed)
Occupational Therapy Session Note  Patient Details  Name: Nathaniel [name] MRN: [id number] Date of Birth: Dec 21, 1985  Today's Date: 02/27/2018 OT Individual Time: 1300-1350 OT Individual Time Calculation (min): 50 min    Short Term Goals: Week 1:  OT Short Term Goal 1 (Week 1): Pt will complete toilet transfer with min A  OT Short Term Goal 2 (Week 1): Pt will don B shoes with supervision using AE PRN.  OT Short Term Goal 3 (Week 1): Pt will complete functional ambulate within room with CGA using LRAD. OT Short Term Goal 4 (Week 1): Pt will use R UE at non-dominant level with cuing for encouragement OT Short Term Goal 5 (Week 1): Pt will complete standing grooming task with supervision  Skilled Therapeutic Interventions/Progress Updates:    Pt resting in bed upon arrival.  Pt declined bathing/dressing tasks stating that he would take shower and change clothes later when his Mom brought him clean clothing.  OT intervention with focus on discharge planning and RUE [facility] and strengthening tasks.  Pt issued soft theraputty with beads.  Pt removed small beads and replaced in theraputty using RUE only.  Pt also engaged in theraputty strengthening tasks for fingers.  Pt also engaged in graded clothes pin tasks with focus on strengthening and manipulation.  Pt uses compensatory strategies to accomplish tasks.  Pt encouraged to complete tasks with correct technique even though it might be difficult.  Pt acknowledge recommendation.  Pt remained seated EOB with bed alarm activated. All needs within reach.   Therapy Documentation Precautions:  Precautions Precautions: Fall Precaution Comments: RLE ataxia and hemiparesis Restrictions Weight Bearing Restrictions: No Pain:  Pt denies pain  See Function Navigator for Current Functional Status.   Therapy/Group: Individual Therapy  Leroy Libman 02/27/2018, 1:52 PM

## 2018-02-27 NOTE — Consult Note (Signed)
Neuropsychological Consultation   Patient:   Nathaniel Nathaniel [name]   DOB:   10-21-1985  MR Number:  [id number]  Location:  [facility] [address] [id number] [place] [address] Dept: [place]: [phone]           Date of Service:   02/27/2018  Start Time:   8 AM End Time:   9 AM  Provider/Observer:  Ilean Skill, Psy.D.       Clinical Neuropsychologist       Billing Code/Service: [phone] 4 Units  Chief Complaint:    Nathaniel Nathaniel [name] is a 32 year old male with history of T1DM and poorly controlled with multiple prior admissions for DKA, HTN, and non-compliance.  Admitted on 02/16/18 with progressive right sided numbness and weakness x 2 days.  In DKA.  CT of head reviewed showing left internal capsule infarct.  MRI revealed acute lacunar infarct of left lateral thalamus/internal capsul, additional small acute lacunar infarcts in subcortical left frontal lobe, subacute infarct in right superior cerebellum and multiple chronic prior strokes.  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  .    Reason for Service:  The patient was referred for neuropsychological consultation due to coping and adjustment issues following stroke and poorly controlled T1DM.  Below is the HPI for the current admission.     HPI: Nathaniel Nathaniel [name] is a 32 year old male with history of T1DM poorly controlled with multiple admissions for DKA, HTN, non-compliance who was admitted on 02/17/2108 with progressive right sided numbness and weakness X  2 days. He was found to have DKA with BS 840, anion gap 19 and acute on chronic renal failure with BUN/SCr/K - 32/2.20/5.5.  History taken from chart review. He was started on DKA protocol and fluid for hydration. CT head reviewed, showing left internal capsule infarct. MRI brain done revealing acute lacunar infarct of left lateral thalamus/internal capsule, additional small acute  lacunar infarcts in subcortical  left frontal lobe, subacute infarct in right superior cerebellum and multiple chronic lacunae in cerebellum and pons. MRA brain showed atherosclerotic changes without significant stenosis. Carotid dopplers without significant ICA stenosis. 2 D echo showed EF 55- 60% with no wall abnormality.    Current Status:  Nathaniel Nathaniel [name] reports that he is coping with some of the recent events but was shocked to find out that he had had prior strokes.  He was aware of the extreme flucuations in his blood glucose levels in past but was well versed in how to rescue self but not able to explain how to keep from needing how to be rescued.  The patient does have ongoing but improved motor function with the best recovery in his right hand.  Patient's mental status good, language good and memory within normal limits.  Patient reports a lot of stress coming from his friend Nathaniel Nathaniel [name] who he feels obligated to help as this friend has "saved his life several times" due to diabetic crisis.  However, his friend has been involved in a lot of risk taking behaviors and the friend is currently in jail for 3 months.    Behavioral Observation: Nathaniel Nathaniel [name]  presents as a 32 y.o.-year-old Right African American Male who appeared his stated age. his dress was Appropriate and he was Well Groomed and his manners were Appropriate to the situation.  his participation was indicative of Appropriate and Attentive behaviors.  There were any physical disabilities noted.  he displayed an appropriate level of cooperation and motivation.     Interactions:    Active Appropriate and Attentive  Attention:   within normal limits and attention span and concentration were age appropriate  Memory:   within normal limits; recent and remote memory intact  Visuo-spatial:  within normal limits  Speech (Volume):  normal  Speech:   normal; normal  Thought Process:  Coherent and Relevant  Though Content:  WNL; not  suicidal and not homicidal  Orientation:   person, place, time/date and situation  Judgment:   Fair  Planning:   Fair  Affect:    Appropriate  Mood:    Anxious  Insight:   Good  Intelligence:   normal  Medical History:   Past Medical History:  Diagnosis Date  . Diabetes mellitus    lantus/novolog  . Hyperlipidemia   . Hypertension   . Marijuana abuse    occaisionally  . Noncompliance with medication regimen   . Tobacco abuse    5/day    Psychiatric History:  No prior history of depression or anxiety.  Family Med/Psych History:  Family History  Problem Relation Age of Onset  . Diabetes Father   . Kidney failure Father        last 4 mnths of life  . Hypertension Mother     Risk of Suicide/Violence: Nathaniel [name] Patient denies SI or HI  Impression/DX:  Nathaniel Nathaniel [name] is a 32 year old male with history of T1DM and poorly controlled with multiple prior admissions for DKA, HTN, and non-compliance.  Admitted on 02/16/18 with progressive right sided numbness and weakness x 2 days.  In DKA.  CT of head reviewed showing left internal capsule infarct.  MRI revealed acute lacunar infarct of left lateral thalamus/internal capsul, additional small acute lacunar infarcts in subcortical left frontal lobe, subacute infarct in right superior cerebellum and multiple chronic prior strokes.  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  Nathaniel Nathaniel [name] reports that he is coping with some of the recent events but was shocked to find out that he had had prior strokes.  He was aware of the extreme flucuations in his blood glucose levels in past but was well versed in how to rescue self but not able to explain how to keep from needing how to be rescued.  The patient does have ongoing but improved motor function with the best recovery in his right hand.  Patient's mental status good, language good and memory within normal limits.  Patient reports a lot of stress coming from his friend Nathaniel Nathaniel [name] who  he feels obligated to help as this friend has "saved his life several times" due to diabetic crisis.  However, his friend has been involved in a lot of risk taking behaviors and the friend is currently in jail for 3 months.  Disposition/Plan:  Will see the patient again later this week or first of next week.  Diagnosis:    Acute ischemic left MCA stroke [facility]) - Plan: Ambulatory referral to Neurology         Electronically Signed   _______________________ Ilean Skill, Psy.D.

## 2018-02-28 ENCOUNTER — Inpatient Hospital Stay (HOSPITAL_COMMUNITY): Payer: Medicare HMO

## 2018-02-28 ENCOUNTER — Inpatient Hospital Stay (HOSPITAL_COMMUNITY): Payer: Medicare HMO | Admitting: Physical Therapy

## 2018-02-28 LAB — BASIC METABOLIC PANEL
Anion gap: 8 (ref 5–15)
BUN: 21 mg/dL — AB (ref 6–20)
CALCIUM: 9.6 mg/dL (ref 8.9–10.3)
CO2: 23 mmol/L (ref 22–32)
Chloride: 107 mmol/L (ref 101–111)
Creatinine, Ser: 1.71 mg/dL — ABNORMAL HIGH (ref 0.61–1.24)
GFR calc Af Amer: 60 mL/min — ABNORMAL LOW (ref >60)
GFR, EST NON AFRICAN AMERICAN: 52 mL/min — AB (ref >60)
GLUCOSE: 146 mg/dL — AB (ref 65–99)
Potassium: 5.4 mmol/L — ABNORMAL HIGH (ref 3.5–5.1)
SODIUM: 138 mmol/L (ref 135–145)

## 2018-02-28 LAB — GLUCOSE, CAPILLARY
GLUCOSE-CAPILLARY: 138 mg/dL — AB (ref 65–99)
GLUCOSE-CAPILLARY: 296 mg/dL — AB (ref 65–99)
GLUCOSE-CAPILLARY: 246 mg/dL — AB (ref 65–99)
GLUCOSE-CAPILLARY: 226 mg/dL — AB (ref 65–99)
Glucose-Capillary: 141 mg/dL — ABNORMAL HIGH (ref 65–99)

## 2018-02-28 MED ORDER — AMLODIPINE BESYLATE 10 MG PO TABS
10.0000 mg | ORAL_TABLET | Freq: Every day | ORAL | Status: DC
  Administered 2018-03-01 – 2018-03-08 (×8): 10 mg via ORAL
  Filled 2018-02-28 (×8): qty 1

## 2018-02-28 MED ORDER — AMLODIPINE BESYLATE 5 MG PO TABS
5.0000 mg | ORAL_TABLET | Freq: Once | ORAL | Status: AC
Start: 2018-02-28 — End: 2018-02-28
  Administered 2018-02-28: 5 mg via ORAL
  Filled 2018-02-28: qty 1

## 2018-02-28 NOTE — Progress Notes (Signed)
Staff observed patient blowing smoke out the open window. Patient acknowledge that he had has a "small" cigarette. Educated patient on hospital policy and safety measures. Facilities alerted, window sealed. Patient verbalized understanding and compliance. Will continue to monitor and report to oncoming RN.

## 2018-02-28 NOTE — Progress Notes (Addendum)
Physical Therapy Session Note  Patient Details  Name: Nathaniel [name] MRN: [id number] Date of Birth: July 11, 1986  Today's Date: 02/28/2018 PT Individual Time:  -      Short Term Goals: Week 2:     Skilled Therapeutic Interventions/Progress Updates:    Pt was received in bed reported R shoulder stiffness this morning with increasing pain with movement to 3-4/10 on NPRS scale. Pt reports this discomfort started in AM with no known origin. Pt self propelled w/c <> gym using all extremities for propulsion. PT initiated stand pivot transfer from EOM<> w/c and Pt demo'd carryover with proper sequencing and safety considerations for successful task completion. PT initiated quadriped activity to promote UE reaching and hip extension, with flexed knees, in weight bearing position for improved motor control. Pt responded positively to exercise and demo'd motor learning with inc repetition of task. PT then initiated standing dynamic balancing activity incorporating diagonal weight shifting with hip extension using therapy ball on mat as pre-gait activity for 2 sets/10 reps leading with B LE. PT utilized 1# ankle wt to R LE during this activity for improved proprioceptive awareness. PT provided min A for steadying and mod tactile facilitation for anterior weight shift with proper hip extension. PT left patient in room with tray table and call bell in reach and all needs met.   Therapy Documentation Precautions:  Precautions Precautions: Fall Precaution Comments: RLE ataxia and hemiparesis Restrictions Weight Bearing Restrictions: No     See Function Navigator for Current Functional Status.   Therapy/Group: Individual Therapy  Floreen Comber 02/28/2018, 9:58 AM

## 2018-02-28 NOTE — Progress Notes (Addendum)
Nathaniel [name] PHYSICAL MEDICINE & REHABILITATION     PROGRESS NOTE    Subjective/Complaints: Pt up with OT. No new complaints  ROS: Patient denies fever, rash, sore throat, blurred vision, nausea, vomiting, diarrhea, cough, shortness of breath or chest pain, joint or back pain, headache, or mood change.    Objective:  No results found. Recent Labs    02/27/18 0513  WBC 9.3  HGB 9.9*  HCT 30.7*  PLT 250   Recent Labs    02/27/18 0513  NA 139  K 3.9  CL 107  GLUCOSE 196*  BUN 27*  CREATININE 1.78*  CALCIUM 8.9   CBG (last 3)  Recent Labs    02/27/18 2123 02/28/18 0431 02/28/18 0631  GLUCAP 379* 138* 141*    Wt Readings from Last 3 Encounters:  02/22/18 72.3 kg (159 lb 4.8 oz)  02/17/18 72.6 kg (160 lb)  10/23/16 67.2 kg (148 lb 2.4 oz)     Intake/Output Summary (Last 24 hours) at 02/28/2018 0852 Last data filed at 02/28/2018 0546 Gross per 24 hour  Intake 900 ml  Output 2000 ml  Net -1100 ml    Vital Signs: Blood pressure (!) 153/106, pulse 81, temperature 97.9 F (36.6 C), temperature source Oral, resp. rate 17, height 6\' 2"  (1.88 m), weight 72.3 kg (159 lb 4.8 oz), SpO2 100 %. Physical Exam:  Constitutional: No distress . Vital signs reviewed. HEENT: EOMI, oral membranes moist Neck: supple Cardiovascular: RRR without murmur. No JVD    Respiratory: CTA Bilaterally without wheezes or rales. Normal effort    GI: BS +, non-tender, non-distended  Musculoskeletal:  No edema or tenderness in extremities  Neurological: He is alert and oriented to person, place, and time.  Motor: Right upper extremity/right lower extremity: 4+/5 proximal to distal with decreased [place]. Good sitting balance  LUE and LLE: 5/5 proximal to distal Sensation diminished to light touch right upper extremity.  Skin: Skin is warm and dry.  Psychiatric: He has a normal mood and affect. His behavior is normal.     Assessment/Plan: 1. 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 Physiatrist is providing close team supervision and 24 hour management of active medical problems listed below. Physiatrist and rehab team continue to assess barriers to discharge/monitor patient progress toward functional and medical goals.  Function:  Bathing Bathing position   Position: Shower  Bathing parts Body parts bathed by patient: Right arm, Right lower leg, Left arm, Chest, Left lower leg, Back, Abdomen, Buttocks, Right upper leg, Left upper leg, Front perineal area    Bathing assist Assist Level: Supervision or verbal cues      Upper Body Dressing/Undressing Upper body dressing   What is the patient wearing?: Pull over shirt/dress     Pull over shirt/dress - Perfomed by patient: Thread/unthread right sleeve, Thread/unthread left sleeve, Put head through opening, Pull shirt over trunk Pull over shirt/dress - Perfomed by helper: Pull shirt over trunk        Upper body assist Assist Level: Supervision or verbal cues      Lower Body Dressing/Undressing Lower body dressing   What is the patient wearing?: Underwear, Pants, Socks, Shoes Underwear - Performed by patient: Thread/unthread right underwear leg, Thread/unthread left underwear leg, Pull underwear up/down   Pants- Performed by patient: Thread/unthread right pants leg, Thread/unthread left pants leg, Pull pants up/down   Non-skid slipper socks- Performed by patient: Don/doff right  sock, Don/doff left sock   Socks - Performed by patient: Don/doff right sock, Don/doff left sock Socks - Performed by helper: Don/doff right sock, Don/doff left sock Shoes - Performed by patient: Don/doff right shoe, Don/doff left shoe Shoes - Performed by helper: Fasten right, Fasten left          Lower body assist Assist for lower body dressing: Touching or steadying assistance (Pt > 75%)      Toileting Toileting  Toileting activity did not occur: No continent bowel/bladder event Toileting steps completed by patient: Adjust clothing prior to toileting, Performs perineal hygiene Toileting steps completed by helper: Adjust clothing after toileting Toileting Assistive Devices: Grab bar or rail  Toileting assist Assist level: Supervision or verbal cues   Transfers Chair/bed transfer   Chair/bed transfer method: Stand pivot Chair/bed transfer assist level: Touching or steadying assistance (Pt > 75%) Chair/bed transfer assistive device: Armrests, Medical sales representative     Max distance: 100 Assist level: Touching or steadying assistance (Pt > 75%)   Wheelchair   Type: Manual Max wheelchair distance: 100 Assist Level: Touching or steadying assistance (Pt > 75%)  Cognition Comprehension Comprehension assist level: Follows basic conversation/direction with no assist  Expression Expression assist level: Expresses basic needs/ideas: With no assist  Social Interaction Social Interaction assist level: Interacts appropriately with others - No medications needed.  Problem Solving Problem solving assist level: Solves complex problems: With extra time  Memory Memory assist level: Recognizes or recalls 50 - 74% of the time/requires cueing 25 - 49% of the time  Medical Problem List and Plan: 1.  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   -continue therapies  -appreciate neurology work up and recs--follow up in 4 weeks   -DAPT for 3 weeks then ASA alone   -30 day cardiac monitoring recommended 2.  DVT Prophylaxis/Anticoagulation: Pharmaceutical: Lovenox 3. Pain Management: N/A 4. Mood: LCSW to follow for evaluation and support.  5. Neuropsych: This patient is capable of making decisions on his own behalf. 6. Skin/Wound Care: routine pressure relief measures.  7. Fluids/Electrolytes/Nutrition: Monitor I/O. Check lytes in am.  8. T1DM with DKA:  Multiple episodes of  DKA with Hgb A1C- 10.       -Sugars improving   -Have  stressed the importance of diet repeatedly  -  mealtime covg 4u, TID with meals  - increased levemir 12u BID.  --no changes today  -I spoke with Dr. Dwyane Dee who saw this patient before. He is willing to see him again as an outpt. Will make referral.  9. HTN: bp's trending up---increase norvasc to 10mg      -  CKD  l  -BUN/Cr increased---push fluids  -follow up lab pending today  10. Dyslipidemia: On lipitor 11. Leucocytosis: afebrile, wbc'x 7.7 12. Acute on chronic renal failure?: Encourage fluid intake.  13. Positive RPR/T Pallidum: Appreciate ID  input  -given risks of LP ID recs treatment   - continue IV Pen G q4 for 10 days total (6/6)---if he leaves soon, he will HH IV abx -Weekly Bicillin IM x3 doses.  He is due again 6/13 and 6/20  LOS (Days) 6 A [place] EVALUATION WAS PERFORMED  Meredith Staggers, MD 02/28/2018 8:52 AM

## 2018-02-28 NOTE — Progress Notes (Signed)
Inpatient Diabetes Program Recommendations  AACE/ADA: New Consensus Statement on Inpatient Glycemic Control (2019)  Target Ranges:  Prepandial:   less than 140 mg/dL      Peak postprandial:   less than 180 mg/dL (1-2 hours)      Critically ill patients:  140 - 180 mg/dL   Results for IKEY, OMARY (MRN [id number]) as of 02/28/2018 10:06  Ref. Range 02/27/2018 04:08 02/27/2018 06:27 02/27/2018 12:06 02/27/2018 12:35 02/27/2018 16:42 02/27/2018 21:23 02/28/2018 04:31 02/28/2018 06:31  Glucose-Capillary Latest Ref Range: 65 - 99 mg/dL 179 (H)  Novolog 2 untis 152 (H)  Novolog 6 units  Levemir 12 units 39 (LL) 70 371 (H)  Novolog 13 units 379 (H)  Novolog 5 units  Levemir 12 units 138 (H) 141 (H)   Review of Glycemic Control  Diabetes history:DM1 (makes no insulin; sensitive to insulin; requires basal, correction, and meal coverage insulin) Outpatient Diabetes medications:Levemir50 units BID Current orders for Inpatient glycemic control:Levemir12units BID, Novolog 0-9units TID with meals, Novolog 0-5 units QHS, Novolog 4 units TID with meals for meal coverage  Inpatient Diabetes Program Recommendations:  Insulin-Basal: Noted Levemir was increased to 12 units BID on 02/27/18.  Correction (SSI): Noted patient received Novolog 2 units at 4:20 am on 02/27/18 and Novolog 6 units (4 units  for meal coverage and 2 units for correction) at 7:43 am on 02/27/18.  Following glucose down to 39 mg/dl at 12:06 on 02/27/18. Anticipate hypoglycemia was due Novolog. Would NOT recommend patient receive Novolog correction during the night and noted Novolog correction 5 times a day was discontinued on 02/27/18. Insulin - Meal Coverage: Noted glucose up to 371 mg/dl at 16:42 mg/dl on 02/27/18. Elevated glucose likely due to over treatment of hypoglycemia and eating lunch an not receiving any meal coverage insulin.  Bedside Nursing: When treating hypoglycemia, it should be treated using the rule of 15 (15  grams of carbohydrates and what 15 mins and recheck). If glucose is still low after treatment of hypoglycemia then it would be retreated again with 15 grams of carbs. 15 grams of carbs will typically bring up glucose about 50 mg/dl.   NOTE: Do NOT recommend any insulin order changes at this time. Patient has Type 1 DM and makes no insulin and he is very sensitive to insulin. Since patient makes no insulin he requires basal, correction, and meal coverage insulin. If patient eats carbohydrates he will need insulin to cover carbohydrates. Nursing please be mindful of hypoglycemia treatment with 15 grams of carbohydrates because if hypoglycemia is treated with excessive carbohydrates then glucose is going to become very elevated.  Thanks, Barnie Alderman, RN, MSN, CDE Diabetes Coordinator Inpatient Diabetes Program [phone] (Team Pager from 8am to 5pm)

## 2018-02-28 NOTE — Progress Notes (Signed)
Physical Therapy Session Note  Patient Details  Name: Nathaniel [name] MRN: [id number] Date of Birth: 08-17-1986  Today's Date: 02/28/2018 PT Individual Time: 1405-1450 PT Individual Time Calculation (min): 45 min   Short Term Goals: Week 1:  PT Short Term Goal 1 (Week 1): Pt will transfer with supervision.  PT Short Term Goal 2 (Week 1): Pt will ambulate 150' with supervision and LRAD.  PT Short Term Goal 3 (Week 1): Pt will negotiate 8 steps with supervision. PT Short Term Goal 4 (Week 1): Pt will complete formal balance outcome measure.   Skilled Therapeutic Interventions/Progress Updates: Pt presented in w/c hand off from OT agreeable to therapy. Session focused on gait activities. Pt ambulated approx 129f with RW with visual feedback for foot placement for x 296falong green line. Noted fair carryover with remaining distance as pt with controledl foot placement. Ambulated 5073fith 3lb cuff and kicking yoga block for coordination and visual feedback. Pt able to demonstrate improved control hitting yoga block target and placing foot on ground. Performed heel cord stretch on red wedge 1 min x 3. While on red wedge worked on static balance removing AD then incorporating UE movement. Pt initially requiring minA for pelvic correction. Pt ambulated additional 100f81fth RW with noted improvement of hip flexion, step length, and foot placement. As pt fatigued pt noted to have decreased foot clearance with toe drag. Pt returned to w/c and transported back to room. Pt remained in w/c at end of session with call bell within reach and needs met.      Therapy Documentation Precautions:  Precautions Precautions: Fall Precaution Comments: RLE ataxia and hemiparesis Restrictions Weight Bearing Restrictions: No General:   Vital Signs: Therapy Vitals Temp: 98.3 F (36.8 C) Pulse Rate: (!) 108 Resp: 17 BP: (!) 155/97 Patient Position (if appropriate): Sitting Oxygen Therapy SpO2: 100 % O2  Device: Room Air  See Function Navigator for Current Functional Status.   Therapy/Group: Individual Therapy  Nike Southwell 02/28/2018, 3:57 PM

## 2018-02-28 NOTE — Progress Notes (Signed)
Occupational Therapy Session Note  Patient Details  Name: Nathaniel [name] MRN: [id number] Date of Birth: 1985/11/10  Today's Date: 02/28/2018 OT Individual Time: 0700-0800 OT Individual Time Calculation (min): 60 min    Short Term Goals: Week 1:  OT Short Term Goal 1 (Week 1): Pt will complete toilet transfer with min A  OT Short Term Goal 2 (Week 1): Pt will don B shoes with supervision using AE PRN.  OT Short Term Goal 3 (Week 1): Pt will complete functional ambulate within room with CGA using LRAD. OT Short Term Goal 4 (Week 1): Pt will use R UE at non-dominant level with cuing for encouragement OT Short Term Goal 5 (Week 1): Pt will complete standing grooming task with supervision  Skilled Therapeutic Interventions/Progress Updates:    OT intervention with focus on functional amb with RW, functional transfers, BADL retraining, RUE functional use, safety awareness, and discharge planning to increase independence with BADLs. Pt with very deliberate transitional movements with transfers and ambulation. Pt amb with RW to bathroom with steady A.  Pt relies on BUE strength to pull on grab bar when standing in shower.  Pt completed dressing tasks with sit<>stand from EOB requiring assistance to fasten shoes.  Pt required min verbal cues to use RUE in functional tasks.  Pt c/o numbness and decreased sensation in RUE with some ataxia noted when completing bathing/dressing tasks.  Pt remained seated EOB with bed alarm activated and RN attending.   Therapy Documentation Precautions:  Precautions Precautions: Fall Precaution Comments: RLE ataxia and hemiparesis Restrictions Weight Bearing Restrictions: No Pain: Pain Assessment Pain Scale: 0-10 Pain Score: 0-No pain  See Function Navigator for Current Functional Status.   Therapy/Group: Individual Therapy  Leroy Libman 02/28/2018, 8:10 AM

## 2018-02-28 NOTE — Progress Notes (Signed)
Occupational Therapy Session Note  Patient Details  Name: PINCHOS TOPEL MRN: [id number] Date of Birth: 05-27-1986  Today's Date: 02/28/2018 OT Individual Time: 1330-1355 OT Individual Time Calculation (min): 25 min    Short Term Goals: Week 1:  OT Short Term Goal 1 (Week 1): Pt will complete toilet transfer with min A  OT Short Term Goal 2 (Week 1): Pt will don B shoes with supervision using AE PRN.  OT Short Term Goal 3 (Week 1): Pt will complete functional ambulate within room with CGA using LRAD. OT Short Term Goal 4 (Week 1): Pt will use R UE at non-dominant level with cuing for encouragement OT Short Term Goal 5 (Week 1): Pt will complete standing grooming task with supervision  Skilled Therapeutic Interventions/Progress Updates:    OT intervention with focus on standing balance and RUE NMR/function.  Pt stood with RW to grasp/retrieve cards with RUE from card board.  Pt with deliberate but accurate movements retrieving/replacing cards.  Pt also removed caps from medicine bottles, placed one bead in each, and replaced cap on bottles.  Pt completed all tasks with more than a reasonable amount of time.  Pt remained in w/c in gym (chair alarm activated) awaiting next therapy.   Therapy Documentation Precautions:  Precautions Precautions: Fall Precaution Comments: RLE ataxia and hemiparesis Restrictions Weight Bearing Restrictions: No Pain:  Pt denies pain  See Function Navigator for Current Functional Status.   Therapy/Group: Individual Therapy  Leroy Libman 02/28/2018, 2:31 PM

## 2018-03-01 ENCOUNTER — Inpatient Hospital Stay (HOSPITAL_COMMUNITY): Payer: Medicare HMO | Admitting: Physical Therapy

## 2018-03-01 ENCOUNTER — Inpatient Hospital Stay (HOSPITAL_COMMUNITY): Payer: Medicare HMO | Admitting: *Deleted

## 2018-03-01 ENCOUNTER — Inpatient Hospital Stay (HOSPITAL_COMMUNITY): Payer: Medicare HMO | Admitting: Occupational Therapy

## 2018-03-01 LAB — GLUCOSE, CAPILLARY
GLUCOSE-CAPILLARY: 329 mg/dL — AB (ref 65–99)
Glucose-Capillary: 186 mg/dL — ABNORMAL HIGH (ref 65–99)
Glucose-Capillary: 152 mg/dL — ABNORMAL HIGH (ref 65–99)
Glucose-Capillary: 235 mg/dL — ABNORMAL HIGH (ref 65–99)
Glucose-Capillary: 199 mg/dL — ABNORMAL HIGH (ref 65–99)

## 2018-03-01 MED ORDER — PREMIER PROTEIN SHAKE
2.0000 [oz_av] | Freq: Four times a day (QID) | ORAL | Status: DC
  Administered 2018-03-01 – 2018-03-02 (×2): 2 [oz_av] via ORAL
  Filled 2018-03-01 (×10): qty 325.31

## 2018-03-01 NOTE — Plan of Care (Signed)
  Problem: Consults Goal: RH STROKE PATIENT EDUCATION Description See Patient Education module for education specifics  Outcome: Progressing   Problem: RH SKIN INTEGRITY Goal: RH STG SKIN FREE OF INFECTION/BREAKDOWN Description No new breakdown with Mod I assist   Outcome: Progressing   Problem: RH SAFETY Goal: RH STG ADHERE TO SAFETY PRECAUTIONS W/ASSISTANCE/DEVICE Description STG Adhere to Safety Precautions With Mod I Assistance/Device.  Outcome: Progressing   Problem: RH COGNITION-NURSING Goal: RH STG ANTICIPATES NEEDS/CALLS FOR ASSIST W/ASSIST/CUES Description STG Anticipates Needs/Calls for Assist With Mod I Assistance/Cues.  Outcome: Progressing   Problem: RH PAIN MANAGEMENT Goal: RH STG PAIN MANAGED AT OR BELOW PT'S PAIN GOAL Description < 2 out of 10.   Outcome: Progressing

## 2018-03-01 NOTE — Progress Notes (Signed)
Physical Therapy Session Note  Patient Details  Name: Nathaniel [name] MRN: [id number] Date of Birth: October 14, 1985  Today's Date: 03/01/2018 PT Individual Time: 1600-1630   Short Term Goals:  Week 1:  PT Short Term Goal 1 (Week 1): Pt will transfer with supervision.  PT Short Term Goal 2 (Week 1): Pt will ambulate 150' with supervision and LRAD.  PT Short Term Goal 3 (Week 1): Pt will negotiate 8 steps with supervision. PT Short Term Goal 4 (Week 1): Pt will complete formal balance outcome measure.   Skilled Therapeutic Interventions/Progress Updates:     Pt reported no pain prior to start of his treatment session this afternoon. PT initiated balance training with biodex to improve weight shifting and ankle strategy. Pt performed 1 trial of limits of stability on static scoring 57% accuracy; and two subsequent trials on level 12 scoring 44% and 57% accuracy demonstrating carry over and motor learning with increased task repetition. PT then initiated maze control x2 trials. Pt score 85% accuracy when machine set on static and 64% accuracy on level 12. Pt required multimodal cues for proper LE placement and achieving adequate posterior leaning with neutral spine for successful task completion. Pt consistently performed SPT using RW with min A for steadying demonstrating proper sequencing and control with transfers. Pt was returned to room with nursing staff preparing to assess his blood sugar levels. Pt was positioned EOM sitting with all needs met.  Therapy Documentation Precautions:  Precautions Precautions: Fall Precaution Comments: RLE ataxia and hemiparesis Restrictions Weight Bearing Restrictions: No      See Function Navigator for Current Functional Status.   Therapy/Group: Individual Therapy  Floreen Comber 03/01/2018, 4:47 PM

## 2018-03-01 NOTE — Progress Notes (Signed)
Occupational Therapy Session Note  Patient Details  Name: Nathaniel [name] MRN: [id number] Date of Birth: 1985/09/26  Today's Date: 03/01/2018 OT Individual Time: 1400-1500 OT Individual Time Calculation (min): 60 min    Short Term Goals: Week 1:  OT Short Term Goal 1 (Week 1): Pt will complete toilet transfer with min A  OT Short Term Goal 2 (Week 1): Pt will don B shoes with supervision using AE PRN.  OT Short Term Goal 3 (Week 1): Pt will complete functional ambulate within room with CGA using LRAD. OT Short Term Goal 4 (Week 1): Pt will use R UE at non-dominant level with cuing for encouragement OT Short Term Goal 5 (Week 1): Pt will complete standing grooming task with supervision  Skilled Therapeutic Interventions/Progress Updates:    Upon entering the room, pt supine in bed with no c/o pain and agreeable to OT intervention this session. Pt performed stand pivot into wheelchair with RW and min A. OT assisted to gym via wheelchair for time management. Pt standing from wheelchair with min A to engage in standing dynavision task. Pt standing for 3 minutes with use of R UE only to hit targets and able to hit 68 targets with reaction time of 2.7 seconds average. Pt taking seated rest break and standing again but on airex wedge to increase balance challenge. Pt with posterior lean and needing min A to correct. Pt able to hit 27 targets within 1 minute and increase average reaction time to 2.2 seconds. No knee buckling during standing activity. Pt returning to wheelchair and seated at table top. OT provided paper handout of theraputty exercises with use of yellow, soft putty. Pt returning demonstrations with min verbal cues for proper technique. Pt remained in wheelchair in room at end of session with chair alarm activated and call bell within reach.   Therapy Documentation Precautions:  Precautions Precautions: Fall Precaution Comments: RLE ataxia and hemiparesis Restrictions Weight  Bearing Restrictions: No General:   Vital Signs: Therapy Vitals Temp: 98.4 F (36.9 C) Temp Source: Oral Pulse Rate: (!) 101 Resp: 19 BP: (!) 155/92 Patient Position (if appropriate): Sitting Oxygen Therapy SpO2: 100 % O2 Device: Room Air  See Function Navigator for Current Functional Status.   Therapy/Group: Individual Therapy  Gypsy Decant 03/01/2018, 4:30 PM

## 2018-03-01 NOTE — Progress Notes (Signed)
Physical Therapy Session Note  Patient Details  Name: Nathaniel [name] MRN: [id number] Date of Birth: August 28, 1986  Today's Date: 03/01/2018 PT Individual Time: 0900-1000 PT Individual Time Calculation (min): 60 min   Short Term Goals: Week 1:  PT Short Term Goal 1 (Week 1): Pt will transfer with supervision.  PT Short Term Goal 2 (Week 1): Pt will ambulate 150' with supervision and LRAD.  PT Short Term Goal 3 (Week 1): Pt will negotiate 8 steps with supervision. PT Short Term Goal 4 (Week 1): Pt will complete formal balance outcome measure.   Skilled Therapeutic Interventions/Progress Updates:    Pt reported no pain prior to start of his treatment session indicating his R shoulder pain from yesterday has subsided. PT initiated dynamic standing balance activity for NMR using bean bag toss with incorporation of proper loading response, hip extension, and reciprocal UE arm swing to facilitate proper pre gait sequencing and muscle activation. PT provided min A for steadying t/o activity duration and provided multimodal cueing for sequencing of LE muscle activation. PT initiated gait training with RW and min A for steadying for 29' providing multimodal cueing for heel strike and loading/ off loading during midstance. Pt demo'd improved carry over from dynamic standing balance activity during gait training. Pt continues to demonstrate reciprocal stepping pattern with slow gait speed. Pt experienced R knee buckling x1 episode with ability to self correct without LOB episode. PT initiated LE strengthening for 4x15 sets of hip bridging in hooklying on mat. PT progressed Pt from no resistance for one set to green t-band distal thigh placement for increased abductor activation and further progressed to single LE eccentric lower for improved motor control. Pt continues to demonstrate improved performance although continues to rely on external cueing for successful task completion. Pt performed SPT t/o treatment  session with min A for steadying and VC's for proper sequencing and extremity placement for safety consideration. PT ended session with Pt seated in w/c with tray table and call bell in reach and all needs met.     Therapy Documentation Precautions:  Precautions Precautions: Fall Precaution Comments: RLE ataxia and hemiparesis Restrictions Weight Bearing Restrictions: No   See Function Navigator for Current Functional Status.   Therapy/Group: Individual Therapy  Floreen Comber 03/01/2018, 1:01 PM

## 2018-03-01 NOTE — Progress Notes (Signed)
[facility] PHYSICAL MEDICINE & REHABILITATION     PROGRESS NOTE    Subjective/Complaints: Pt in bed. Waiting for breakfast. No new complaints. Smoked part of a friend's cigarette yesterday----didn't like how it made him feel  ROS: Patient denies fever, rash, sore throat, blurred vision, nausea, vomiting, diarrhea, cough, shortness of breath or chest pain, joint or back pain, headache, or mood change.   Objective:  No results found. Recent Labs    02/27/18 0513  WBC 9.3  HGB 9.9*  HCT 30.7*  PLT 250   Recent Labs    02/27/18 0513 02/28/18 0814  NA 139 138  K 3.9 5.4*  CL 107 107  GLUCOSE 196* 146*  BUN 27* 21*  CREATININE 1.78* 1.71*  CALCIUM 8.9 9.6   CBG (last 3)  Recent Labs    02/28/18 2111 03/01/18 0359 03/01/18 0629  GLUCAP 226* 186* 152*    Wt Readings from Last 3 Encounters:  02/22/18 72.3 kg (159 lb 4.8 oz)  02/17/18 72.6 kg (160 lb)  10/23/16 67.2 kg (148 lb 2.4 oz)     Intake/Output Summary (Last 24 hours) at 03/01/2018 0849 Last data filed at 03/01/2018 0601 Gross per 24 hour  Intake 480 ml  Output 1675 ml  Net -1195 ml    Vital Signs: Blood pressure (!) 142/99, pulse 86, temperature 98 F (36.7 C), temperature source Oral, resp. rate 17, height 6\' 2"  (1.88 m), weight 72.3 kg (159 lb 4.8 oz), SpO2 100 %. Physical Exam:  Constitutional: No distress . Vital signs reviewed. HEENT: EOMI, oral membranes moist Neck: supple Cardiovascular: RRR without murmur. No JVD    Respiratory: CTA Bilaterally without wheezes or rales. Normal effort    GI: BS +, non-tender, non-distended  Musculoskeletal:  No edema or tenderness in extremities  Neurological: He is alert and oriented to person, place, and time.  Motor: Right upper extremity/right lower extremity: 4+/5 proximal to distal with decreased [place]. Good sitting balance  LUE and LLE: 5/5 proximal to distal Sensation sl diminished to light touch right upper extremity.  Skin: Skin is warm and dry.   Psychiatric: He has a normal mood and affect. His behavior is normal.     Assessment/Plan: 1. 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 Physiatrist is providing close team supervision and 24 hour management of active medical problems listed below. Physiatrist and rehab team continue to assess barriers to discharge/monitor patient progress toward functional and medical goals.  Function:  Bathing Bathing position   Position: Shower  Bathing parts Body parts bathed by patient: Right arm, Right lower leg, Left arm, Chest, Left lower leg, Back, Abdomen, Buttocks, Right upper leg, Left upper leg, Front perineal area    Bathing assist Assist Level: Supervision or verbal cues      Upper Body Dressing/Undressing Upper body dressing   What is the patient wearing?: Pull over shirt/dress     Pull over shirt/dress - Perfomed by patient: Thread/unthread right sleeve, Thread/unthread left sleeve, Put head through opening, Pull shirt over trunk Pull over shirt/dress - Perfomed by helper: Pull shirt over trunk        Upper body assist Assist Level: Supervision or verbal cues      Lower Body Dressing/Undressing Lower body dressing   What is the patient wearing?: Underwear, Pants, Socks, Shoes Underwear - Performed by patient: Thread/unthread right underwear leg, Thread/unthread left underwear leg, Pull underwear up/down   Pants-  Performed by patient: Thread/unthread right pants leg, Thread/unthread left pants leg, Pull pants up/down   Non-skid slipper socks- Performed by patient: Don/doff right sock, Don/doff left sock   Socks - Performed by patient: Don/doff right sock, Don/doff left sock Socks - Performed by helper: Don/doff right sock, Don/doff left sock Shoes - Performed by patient: Don/doff right shoe, Don/doff left shoe Shoes - Performed by helper: Fasten right, Fasten  left          Lower body assist Assist for lower body dressing: Touching or steadying assistance (Pt > 75%)      Toileting Toileting Toileting activity did not occur: No continent bowel/bladder event Toileting steps completed by patient: Adjust clothing prior to toileting, Performs perineal hygiene, Adjust clothing after toileting Toileting steps completed by helper: Adjust clothing after toileting Toileting Assistive Devices: Grab bar or rail  Toileting assist Assist level: Supervision or verbal cues   Transfers Chair/bed transfer   Chair/bed transfer method: Squat pivot, Stand pivot Chair/bed transfer assist level: Touching or steadying assistance (Pt > 75%) Chair/bed transfer assistive device: Medical sales representative     Max distance: 100 Assist level: Touching or steadying assistance (Pt > 75%)   Wheelchair   Type: Manual Max wheelchair distance: 34' Assist Level: Supervision or verbal cues  Cognition Comprehension Comprehension assist level: Follows basic conversation/direction with no assist  Expression Expression assist level: Expresses basic needs/ideas: With no assist  Social Interaction Social Interaction assist level: Interacts appropriately with others - No medications needed.  Problem Solving Problem solving assist level: Solves complex problems: With extra time  Memory Memory assist level: Recognizes or recalls 50 - 74% of the time/requires cueing 25 - 49% of the time  Medical Problem List and Plan: 1.  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   -continue therapies  -appreciate neurology work up and recs--follow up in 4 weeks   -DAPT for 3 weeks then ASA alone   -30 day cardiac monitoring recommended  -had an extensive talk with pt regarding stroke risk factors including tobacco use and diabetes and their potential to cause further neurological complications and other [place] 2.  DVT Prophylaxis/Anticoagulation:  Pharmaceutical: Lovenox 3. Pain Management: N/A 4. Mood: LCSW to follow for evaluation and support.  5. Neuropsych: This patient is capable of making decisions on his own behalf. 6. Skin/Wound Care: routine pressure relief measures.  7. Fluids/Electrolytes/Nutrition: Monitor I/O. Check lytes in am.  8. T1DM with DKA:  Multiple episodes of DKA with Hgb A1C- 10.       -Sugars improving, a little less stabile over last 24  -Have  stressed the importance of diet/consistency repeatedly  -  mealtime covg 4u, TID with meals  - levemir 12u BID.  --no changes today  - Dr. Dwyane Dee who saw this patient before is willing to see him again as an outpt.     -will make referral  9. HTN: bp's trending up---increase norvasc to 10mg      -  CKD  l  -BUN a little better today, Cr stable 10. Dyslipidemia: On lipitor 11. Leucocytosis: afebrile, wbc'x 7.7 12. Acute on chronic renal failure?: Encourage fluid intake.  13. Positive RPR/T Pallidum: Appreciate ID  help  -given risks of LP ID recs treatment   - continue IV Pen G q4 for 10 days total (6/6)---thru 6/15  -Weekly Bicillin IM x3 doses.  He is due again 6/13 and 6/20  LOS (Days) 7 A FACE TO  FACE EVALUATION WAS PERFORMED  Greater than 50% of time during this encounter was spent counseling patient/family in regard to diabetes and stroke risk factors.    Meredith Staggers, MD 03/01/2018 8:49 AM

## 2018-03-01 NOTE — Progress Notes (Addendum)
Occupational Therapy Session Note  Patient Details  Name: Nathaniel [name] MRN: [id number] Date of Birth: [birth year]  Today's Date: 03/01/2018 OT Individual Time: 1100-1200 OT Individual Time Calculation (min): 60 min    Short Term Goals: Week 1:  OT Short Term Goal 1 (Week 1): Pt will complete toilet transfer with min A  OT Short Term Goal 2 (Week 1): Pt will don B shoes with supervision using AE PRN.  OT Short Term Goal 3 (Week 1): Pt will complete functional ambulate within room with CGA using LRAD. OT Short Term Goal 4 (Week 1): Pt will use R UE at non-dominant level with cuing for encouragement OT Short Term Goal 5 (Week 1): Pt will complete standing grooming task with supervision  Skilled Therapeutic Interventions/Progress Updates:    1:1 Self care retraining at shower level. Focus on functional ambulation without AD around the room with gait belt and mod A with external cues for more neutral pelvis and maintaining upright trunk posture. Pt with improvement in controlled and smoother movements in right hand with washing and applying lotion. At times pt perfers to use left hand for ease however with encouragement able to complete a self care tasks (doff shoes and socks) with right hand.   Continued to focus on dynamic standing balance and trunk control and posture at the rail in the hallway with focus on maintaining neutral pelvis (decr anterior pelvic tilt ) and forward trunk posture with steps forward with frequent hand support but working on correcting positioning with weight shifts rather than UE support.   Therapy Documentation Precautions:  Precautions Precautions: Fall Precaution Comments: RLE ataxia and hemiparesis Restrictions Weight Bearing Restrictions: No Pain: Pain Assessment Pain Scale: 0-10 Pain Score: 0-No pain  See Function Navigator for Current Functional Status.   Therapy/Group: Individual Therapy  Willeen Cass [facility] 03/01/2018, 11:26 AM

## 2018-03-01 NOTE — Patient Care Conference (Signed)
Inpatient RehabilitationTeam Conference and Plan of Care Update Date: 02/28/2018   Time: 2:15 PM    Patient Name: Nathaniel [name]      Medical Record Number: [id number]  Date of Birth: [birth year] Sex: Male         Room/Bed: 4W09C/4W09C-01 Payor Info: Payor: [employer] MEDICARE / Plan: [employer] MEDICARE HMO/PPO / Product Type: *No Product type* /    Admitting Diagnosis: multiple Infarcts  Admit Date/Time:  02/22/2018  2:07 PM Admission Comments: No comment available   Primary Diagnosis:  <principal problem not specified> Principal Problem: <principal problem not specified>  Patient Active Problem List   Diagnosis Date Noted  . Stage 3 chronic kidney disease ([place])   . Hypoglycemia   . Neurosyphilis   . Mixed hyperlipidemia   . Acute ischemic left MCA stroke ([place]) 02/22/2018  . Abnormality of gait following cerebrovascular accident (CVA)   . 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 Essential hypertension   . Dyslipidemia   . Syphilis   . DKA (diabetic ketoacidoses) ([place]) 02/16/2018  . CVA (cerebral vascular accident) ([place]) 02/16/2018  . Acute renal failure (ARF) ([place]) 10/21/2016  . Diabetic ketoacidosis without coma associated with type 1 diabetes mellitus ([place])   . Hyperbilirubinemia   . Hyponatremia 04/15/2016  . DM (diabetes mellitus) type 1, uncontrolled, with ketoacidosis ([place]) 04/15/2016  . Hyperglycemia 04/15/2016  . AKI (acute kidney injury) ([place]) 01/04/2016  . Malignant hypertension 01/04/2016  . Noncompliance with medications 01/04/2016  . Intractable nausea and vomiting 04/01/2015  . Gastroparesis 04/01/2015  . Type 1 diabetes mellitus with complication ([place]) 87/86/7672  . Chronic hypertension   . Nausea with vomiting   . Abscess, gluteal, right 06/21/2014  . Nausea and vomiting 04/08/2013  . Hyperglycemia without ketosis 04/08/2013  . Essential hypertension, benign 04/08/2013  . Hypercalcemia 07/29/2011  . Vomiting 07/28/2011  .  Leukocytosis 07/28/2011  . Hypokalemia 07/28/2011  . DKA, type 1 ([place]) 07/27/2011  . Dehydration 07/27/2011  . Smoker 07/27/2011  . Marijuana abuse 07/27/2011    Expected Discharge Date: Expected Discharge Date: 03/08/18  Team Members Present: Physician leading conference: Dr. Alger Simons Social Worker Present: Lennart Pall, LCSW Nurse Present: Benjie Karvonen, RN PT Present: Dwyane Dee, PT OT Present: Roanna Epley, [place], OT SLP Present: Weston Anna, SLP PPS Coordinator present : Daiva Nakayama, RN, CRRN     Current Status/Progress Goal Weekly Team Focus  Medical   left CVA with right HP. sugars difficult to control, pt with poor awareness of DM control  improve glycemic control  glycemic mgt, adjustment of regimen. bp control   Bowel/Bladder   continent of b/b   remain continent of b/b min assist  monitor b/b laxatives prn, toilet Q2H with min assist   Swallow/Nutrition/ Hydration             ADL's   min A/supervision overall  bathing-independenct; dressing I with AE/AD; toileting-I with AD; shower transfer-supervision; homemaking tasks-supervision  RUE NMR; functional tranfsers; BADL retraining   Mobility   min guard overall, can be close supervision for transfers  mod I but may need to downgrade to supervision given poor carry over in sessions  balance, neuromuscular re-ed, safety awareness, all functional mobility, strengthening, activity tolerance   Communication             Safety/Cognition/ Behavioral Observations            Pain   denies any pain at present  pt will have pain <=  2/10  assess pain Qshift and prn   Skin   skin intact  skin will be free of breakdown or infection  assess skin Qshift and prn    Rehab Goals Patient on target to meet rehab goals: Yes *See Care Plan and progress notes for long and short-term goals.     Barriers to Discharge  Current Status/Progress Possible Resolutions Date Resolved   Physician    Medical  stability               Nursing                  PT                    OT                  SLP                SW                Discharge Planning/Teaching Needs:  Plan to d/c home with mother who can provide 24/7 support.  Teaching needs TBD - may need some supervision   Team Discussion:  MD notes significant DM/ brittle.  Poor insight and understanding of DM management.  Dr. Dwyane Dee has agreed to accept pt back to his practive.  Cont b/b.  Min/ min-guard overall with PT/OT.  Needs cues for thorough cleaning.  Limited prgress with PT and may need to downgrade some goals to supervision.  Revisions to Treatment Plan:  May need to down grade some PT goals.    Continued Need for Acute Rehabilitation Level of Care: The patient requires daily medical management by a physician with specialized training in physical medicine and rehabilitation for the following conditions: Daily direction of a multidisciplinary physical rehabilitation program to ensure safe treatment while eliciting the highest outcome that is of practical value to the patient.: Yes Daily medical management of patient stability for increased activity during participation in an intensive rehabilitation regime.: Yes Daily analysis of laboratory values and/or radiology reports with any subsequent need for medication adjustment of medical intervention for : Post surgical problems;Diabetes problems;Neurological problems  Kyrianna Barletta 03/01/2018, 11:35 AM

## 2018-03-02 ENCOUNTER — Inpatient Hospital Stay (HOSPITAL_COMMUNITY): Payer: Medicare HMO | Admitting: Physical Therapy

## 2018-03-02 ENCOUNTER — Inpatient Hospital Stay (HOSPITAL_COMMUNITY): Payer: Medicare HMO

## 2018-03-02 LAB — GLUCOSE, CAPILLARY
GLUCOSE-CAPILLARY: 314 mg/dL — AB (ref 65–99)
GLUCOSE-CAPILLARY: 213 mg/dL — AB (ref 65–99)
GLUCOSE-CAPILLARY: 167 mg/dL — AB (ref 65–99)
GLUCOSE-CAPILLARY: 168 mg/dL — AB (ref 65–99)
Glucose-Capillary: 295 mg/dL — ABNORMAL HIGH (ref 65–99)

## 2018-03-02 MED ORDER — INSULIN DETEMIR 100 UNIT/ML ~~LOC~~ SOLN
14.0000 [IU] | Freq: Two times a day (BID) | SUBCUTANEOUS | Status: DC
  Administered 2018-03-02 – 2018-03-05 (×6): 14 [IU] via SUBCUTANEOUS
  Filled 2018-03-02 (×7): qty 0.14

## 2018-03-02 MED ORDER — PREMIER PROTEIN SHAKE
11.0000 [oz_av] | Freq: Four times a day (QID) | ORAL | Status: DC
  Administered 2018-03-06 – 2018-03-07 (×2): 11 [oz_av] via ORAL
  Filled 2018-03-02 (×31): qty 325.31

## 2018-03-02 NOTE — Progress Notes (Signed)
RN provided extensive education regarding management of BP and DM with medication and diet. Also educated on importance of smoking cessation with pt having high risk factors of having strokes. RN will continue to educate on proper health management.

## 2018-03-02 NOTE — Progress Notes (Signed)
[facility] PHYSICAL MEDICINE & REHABILITATION     PROGRESS NOTE    Subjective/Complaints: Pt eating breakfast when I entered. Concerned that when he eats he has bowel incontinence. Sometimes bowels keep him up at night.   ROS: Patient denies fever, rash, sore throat, blurred vision, nausea, vomiting, diarrhea, cough, shortness of breath or chest pain, joint or back pain, headache, or mood change.    Objective:  No results found. No results for input(s): WBC, HGB, HCT, PLT in the last 72 hours. Recent Labs    02/28/18 0814  NA 138  K 5.4*  CL 107  GLUCOSE 146*  BUN 21*  CREATININE 1.71*  CALCIUM 9.6   CBG (last 3)  Recent Labs    03/02/18 0418 03/02/18 0640 03/02/18 1118  GLUCAP 314* 295* 213*    Wt Readings from Last 3 Encounters:  02/22/18 72.3 kg (159 lb 4.8 oz)  02/17/18 72.6 kg (160 lb)  10/23/16 67.2 kg (148 lb 2.4 oz)     Intake/Output Summary (Last 24 hours) at 03/02/2018 1157 Last data filed at 03/02/2018 1042 Gross per 24 hour  Intake 960 ml  Output 1350 ml  Net -390 ml    Vital Signs: Blood pressure 126/76, pulse 84, temperature 97.7 F (36.5 C), temperature source Oral, resp. rate 18, height 6\' 2"  (1.88 m), weight 72.3 kg (159 lb 4.8 oz), SpO2 100 %. Physical Exam:  Constitutional: No distress . Vital signs reviewed. HEENT: EOMI, oral membranes moist Neck: supple Cardiovascular: RRR without murmur. No JVD    Respiratory: CTA Bilaterally without wheezes or rales. Normal effort    GI: BS +, non-tender, non-distended  Musculoskeletal:  No edema or tenderness in extremities  Neurological: He is alert and oriented to person, place, and time.  Motor: Right upper extremity/right lower extremity: 4+/5 proximal to distal with decreased [place]. Good sitting balance  LUE and LLE: 5/5 proximal to distal Sensation sl diminished to light touch right upper extremity. No change Skin: Skin is warm and dry.  Psychiatric: He has a normal mood and affect. His  behavior is normal.     Assessment/Plan: 1. 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 Physiatrist is providing close team supervision and 24 hour management of active medical problems listed below. Physiatrist and rehab team continue to assess barriers to discharge/monitor patient progress toward functional and medical goals.  Function:  Bathing Bathing position   Position: Shower  Bathing parts Body parts bathed by patient: Right arm, Right lower leg, Left arm, Chest, Left lower leg, Back, Abdomen, Buttocks, Right upper leg, Left upper leg, Front perineal area    Bathing assist Assist Level: Supervision or verbal cues      Upper Body Dressing/Undressing Upper body dressing   What is the patient wearing?: Pull over shirt/dress     Pull over shirt/dress - Perfomed by patient: Thread/unthread right sleeve, Thread/unthread left sleeve, Put head through opening, Pull shirt over trunk Pull over shirt/dress - Perfomed by helper: Pull shirt over trunk        Upper body assist Assist Level: Supervision or verbal cues      Lower Body Dressing/Undressing Lower body dressing   What is the patient wearing?: Underwear, Pants, Socks, Shoes Underwear - Performed by patient: Thread/unthread right underwear leg, Thread/unthread left underwear leg, Pull underwear up/down   Pants- Performed by patient: Thread/unthread right pants leg, Thread/unthread left pants leg, Pull pants up/down  Non-skid slipper socks- Performed by patient: Don/doff right sock, Don/doff left sock   Socks - Performed by patient: Don/doff right sock, Don/doff left sock Socks - Performed by helper: Don/doff right sock, Don/doff left sock Shoes - Performed by patient: Don/doff right shoe, Don/doff left shoe Shoes - Performed by helper: Fasten right, Fasten left          Lower body assist Assist for lower  body dressing: Touching or steadying assistance (Pt > 75%)      Toileting Toileting Toileting activity did not occur: No continent bowel/bladder event Toileting steps completed by patient: Adjust clothing prior to toileting, Performs perineal hygiene, Adjust clothing after toileting Toileting steps completed by helper: Adjust clothing after toileting Toileting Assistive Devices: Grab bar or rail  Toileting assist Assist level: Supervision or verbal cues   Transfers Chair/bed transfer   Chair/bed transfer method: Stand pivot Chair/bed transfer assist level: Touching or steadying assistance (Pt > 75%) Chair/bed transfer assistive device: Medical sales representative     Max distance: 10' Assist level: Touching or steadying assistance (Pt > 75%)   Wheelchair   Type: Manual Max wheelchair distance: 34' Assist Level: Supervision or verbal cues  Cognition Comprehension Comprehension assist level: Follows basic conversation/direction with no assist  Expression Expression assist level: Expresses basic needs/ideas: With no assist  Social Interaction Social Interaction assist level: Interacts appropriately with others - No medications needed.  Problem Solving Problem solving assist level: Solves complex problems: With extra time  Memory Memory assist level: Recognizes or recalls 50 - 74% of the time/requires cueing 25 - 49% of the time  Medical Problem List and Plan: 1.  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   -continue therapies  -appreciate neurology work up and recs--follow up in 4 weeks from admit   -DAPT for 3 weeks then ASA alone   -30 day cardiac monitoring recommended 2.  DVT Prophylaxis/Anticoagulation: Pharmaceutical: Lovenox 3. Pain Management: N/A 4. Mood: LCSW to follow for evaluation and support.  5. Neuropsych: This patient is capable of making decisions on his own behalf. 6. Skin/Wound Care: routine pressure relief measures.  7.  Fluids/Electrolytes/Nutrition: Monitor I/O. Check lytes in am.  8. T1DM with DKA:  Multiple episodes of DKA with Hgb A1C- 10.       -Sugars improving, a little less stabile over last 24  -Have  stressed the importance of diet/consistency repeatedly  -  mealtime covg 4u, TID with meals  - levemir: increase to 14 u bid  - Dr. Dwyane Dee who saw this patient before is willing to see him again as an outpt.     -will make referral   9. HTN: bp's trending up---increase norvasc to 10mg      -  CKD  l  -BUN a little better ,  Cr stable 10. Dyslipidemia: On lipitor 11. Leucocytosis: afebrile, wbc'x 7.7 12. Acute on chronic renal failure?: Encourage fluid intake.  13. Positive RPR/T Pallidum: Appreciate ID  help  -given risks of LP ID recs treatment   - continue IV Pen G q4 for 10 days total (6/6)---thru 6/15  -Weekly Bicillin IM x3 doses.  He is due again 6/13 and 6/20  LOS (Days) 8 A FACE TO FACE EVALUATION WAS PERFORMED  Greater than 50% of time during this encounter was spent counseling patient/family in regard to diabetes and stroke risk factors.    Meredith Staggers, MD 03/02/2018 11:57 AM

## 2018-03-02 NOTE — Progress Notes (Signed)
Occupational Therapy Session Note  Patient Details  Name: Nathaniel [name] MRN: [id number] Date of Birth: 10-23-1985  Today's Date: 03/02/2018 OT Individual Time: 1315-1400 OT Individual Time Calculation (min): 45 min    Short Term Goals: Week 1:  OT Short Term Goal 1 (Week 1): Pt will complete toilet transfer with min A  OT Short Term Goal 2 (Week 1): Pt will don B shoes with supervision using AE PRN.  OT Short Term Goal 3 (Week 1): Pt will complete functional ambulate within room with CGA using LRAD. OT Short Term Goal 4 (Week 1): Pt will use R UE at non-dominant level with cuing for encouragement OT Short Term Goal 5 (Week 1): Pt will complete standing grooming task with supervision  Skilled Therapeutic Interventions/Progress Updates:    OT intervention with focus on RUE [facility] and in hand manipulation to increase independence with BADLs. Pt engaged in theraputty tasks for strengthening and removing small beads from putty.  Pt also engaged in placing/removing small pegs from peg board.  Pt issued pen and pencil with built-up grip to facilitate grasp.  Pt able to write name (printing and cursive). Pt instructed to continue practicing handwriting.  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  Pt with improved RUE FMC and gross motor control.  Pt states his RUE/hand fatigues quickly but he has noted improvement also.   Therapy Documentation Precautions:  Precautions Precautions: Fall Precaution Comments: RLE ataxia and hemiparesis Restrictions Weight Bearing Restrictions: No Vital Signs: Therapy Vitals Pulse Rate: 100 Resp: 18 BP: (!) 146/87 Patient Position (if appropriate): Sitting Oxygen Therapy SpO2: 98 % O2 Device: Room Air Pain:  Pt denies pain  See Function Navigator for Current Functional Status.   Therapy/Group: Individual Therapy  Leroy Libman 03/02/2018, 2:47 PM

## 2018-03-02 NOTE — Progress Notes (Signed)
Physical Therapy Weekly Progress Note  Patient Details  Name: Nathaniel [name] MRN: [id number] Date of Birth: 05/16/1986  Beginning of progress report period: February 22, 2018 End of progress report period: March 02, 2018  Today's Date: 03/02/2018 PT Individual Time: 5859-2924 & 4628-6381 PT Individual Time Calculation (min): 55 min and 60 min  Patient has met 1 of 4 short term goals.  Pt is making slow progress and continues to require up to min A during gait for steadying and demonstrates poor carry over for safety requiring reinforcement for cueing from session to session.  Patient continues to demonstrate the following deficits muscle weakness and muscle joint tightness, impaired timing and sequencing, unbalanced muscle activation, motor apraxia, decreased coordination and decreased motor planning, decreased problem solving, decreased safety awareness and decreased memory and decreased standing balance, decreased postural control, hemiplegia and decreased balance strategies and therefore will continue to benefit from skilled PT intervention to increase functional independence with mobility.  Patient progressing toward long term goals..  Continue plan of care.  PT Short Term Goals Week 1:  PT Short Term Goal 1 (Week 1): Pt will transfer with supervision.  PT Short Term Goal 1 - Progress (Week 1): Progressing toward goal PT Short Term Goal 2 (Week 1): Pt will ambulate 150' with supervision and LRAD.  PT Short Term Goal 2 - Progress (Week 1): Not met PT Short Term Goal 3 (Week 1): Pt will negotiate 8 steps with supervision. PT Short Term Goal 3 - Progress (Week 1): Not met PT Short Term Goal 4 (Week 1): Pt will complete formal balance outcome measure.  PT Short Term Goal 4 - Progress (Week 1): Met Week 2:  PT Short Term Goal 1 (Week 2): =LTG due to ELOS  Skilled Therapeutic Interventions/Progress Updates:  Session 1:  Pt reported stomach discomfort prior to the start of his treatment  session indicating nausea began 6/12 afternoon. Pt requested a shower and PT assisted with set up of clothes, towels, and other necessities. Pt consistently performs STS and ambulation with RW requiring min A from PT for steadying and safe placement of RW when navigating floor inclines or tight spaces. Pt continues to require multimodal cueing for turning to sit safely with RW and assist with proper sequencing for safety considerations. Pt demonstrates ability to perform self removal of clothing and seated shower with supervision and improved trunk control required for dynamic sitting balance on shower chair. Pt able to vocalize how he wants the shower set up and express basic needs and wants. PT reinforced to Pt to pull string if he needs help and to not ambulate without assistance to decrease fall risk potential. Pt was left with occupational therapy to finish up shower and dressing.   Session 2:  Pt reported slight stomach discomfort prior to the start of his treatment session. PT implemented gait training for one trial x 60 ft with close supervision using RW with incorporation of retrograde ambulation for increased difficulty with task. Pt required multimodal cueing for adequate step length, hip extension, and unloading during midstance with adequate lateral weight shift. PT further progressed Pt to cone weaving for navigation using RW in tight spaces and ambulation over uneven compliant surface. Pt required VC's for lifting RW onto compliant surface while maintaining proximity to AD for safety consideration. PT implemented R and L side stepping focusing on pelvic stability providing tactile facilitation of anterior pelvic tilt to maintain upright posture with stepping. Pt used UE assist on handrails for steadying  during task. Pt demo'd inc difficulty when leading with RLE possibly due to proprioceptive deficit requiring VC's for initiating bigger steps. PT further progressed Pt to side stepping in both  directions with incorporation of stepping over cones. Pt demo'd dec hip, knee, and ankle flexion when leading with RLE utilizing compensatory techniques requiring tactile facilitation at pelvis. Pt continues to demonstrate dec cadence, step length, and heel strike and will benefit from further gait training and neuromuscular re-education to optimize gait pattern. Pt self propelled w/c 75 feet back to room with Mod I. Pt was left in room with call bell and tray table in reach and all needs met.  Therapy Documentation Precautions:  Precautions Precautions: Fall Precaution Comments: RLE ataxia and hemiparesis Restrictions Weight Bearing Restrictions: No   See Function Navigator for Current Functional Status.  Therapy/Group: Individual Therapy  Floreen Comber 03/02/2018, 3:20 PM

## 2018-03-02 NOTE — Progress Notes (Signed)
Occupational Therapy Session Note  Patient Details  Name: Nathaniel [name] MRN: [id number] Date of Birth: 02/06/1986  Today's Date: 03/02/2018 OT Individual Time: 1000-1100 OT Individual Time Calculation (min): 60 min    Short Term Goals: Week 1:  OT Short Term Goal 1 (Week 1): Pt will complete toilet transfer with min A  OT Short Term Goal 2 (Week 1): Pt will don B shoes with supervision using AE PRN.  OT Short Term Goal 3 (Week 1): Pt will complete functional ambulate within room with CGA using LRAD. OT Short Term Goal 4 (Week 1): Pt will use R UE at non-dominant level with cuing for encouragement OT Short Term Goal 5 (Week 1): Pt will complete standing grooming task with supervision  Skilled Therapeutic Interventions/Progress Updates:    Pt initially engaged in BADL retraining including bathing at shower level and dressing with sit<>stand from chair.  Pt completed all tasks with steady A for standing only.  Pt able to tie his shoes without assistance this morning.  Pt amb back to room and brushed his teeth while standing at sink.  Pt transitioned to day room and engaged in [facility] tasks.  9 hole peg test: R-1:36.71, L-42.75 secs. Pt practiced with nuts and bolts and small pegs.  Pt remained in w/c and returned to room.  Chair alarm activated.   Therapy Documentation Precautions:  Precautions Precautions: Fall Precaution Comments: RLE ataxia and hemiparesis Restrictions Weight Bearing Restrictions: No   Pain:  Pt denies pain  See Function Navigator for Current Functional Status.   Therapy/Group: Individual Therapy  Leroy Libman 03/02/2018, 12:14 PM

## 2018-03-02 NOTE — Progress Notes (Signed)
Social Work Patient ID: Nathaniel [name], male   DOB: Mar 16, 1986, 32 y.o.   MRN: [id number]   Have reviewed team conference with both pt and his mother (via phone).  Both aware and agreeable with targeted d/c date of 6/19 and supervision - mod independent goals overall. Pt very pleased with progress.  DM education continues to be a primary focus.  Continue to follow.  Izza Bickle, LCSW

## 2018-03-03 ENCOUNTER — Inpatient Hospital Stay (HOSPITAL_COMMUNITY): Payer: Medicare HMO

## 2018-03-03 ENCOUNTER — Inpatient Hospital Stay (HOSPITAL_COMMUNITY): Payer: Medicare HMO | Admitting: Physical Therapy

## 2018-03-03 LAB — GLUCOSE, CAPILLARY
GLUCOSE-CAPILLARY: 296 mg/dL — AB (ref 65–99)
GLUCOSE-CAPILLARY: 265 mg/dL — AB (ref 65–99)
Glucose-Capillary: 167 mg/dL — ABNORMAL HIGH (ref 65–99)
Glucose-Capillary: 261 mg/dL — ABNORMAL HIGH (ref 65–99)

## 2018-03-03 NOTE — Progress Notes (Signed)
Occupational Therapy Weekly Progress Note  Patient Details  Name: [name] MRN: [id number] Date of Birth: 1986/01/31  Beginning of progress report period: February 23, 2018 End of progress report period: March 03, 2018  Patient has met 5 of 5 short term goals.  Pt has made steady progress with BADLs since admission.  Pt initiates use of RUE for all functional tasks and is currently using RUE at diminished level.  Pt requires min verbal cues to initiate use of RUE.  Pt requires steady A for functional transfers and functional amb with RW.  Pt completes bathing/dressing tasks at supervision level except for standing balance to pull up pants.    Patient continues to demonstrate the following deficits: ataxia, hemiplegia affecting dominant side and muscle weakness (generalized) and therefore will continue to benefit from skilled OT intervention to enhance overall performance with BADL, iADL and Reduce care partner burden.  Patient progressing toward long term goals..  Continue plan of care.  OT Short Term Goals Week 1:  OT Short Term Goal 1 (Week 1): Pt will complete toilet transfer with min A  OT Short Term Goal 1 - Progress (Week 1): Met OT Short Term Goal 2 (Week 1): Pt will don B shoes with supervision using AE PRN.  OT Short Term Goal 2 - Progress (Week 1): Met OT Short Term Goal 3 (Week 1): Pt will complete functional ambulate within room with CGA using LRAD. OT Short Term Goal 3 - Progress (Week 1): Met OT Short Term Goal 4 (Week 1): Pt will use R UE at non-dominant level with cuing for encouragement OT Short Term Goal 4 - Progress (Week 1): Met OT Short Term Goal 5 (Week 1): Pt will complete standing grooming task with supervision OT Short Term Goal 5 - Progress (Week 1): Met Week 2:  OT Short Term Goal 1 (Week 2): STG=LTG secondary to ELOS     Therapy Documentation Precautions:  Precautions Precautions: Fall Precaution Comments: RLE ataxia and  hemiparesis Restrictions Weight Bearing Restrictions: No    See Function Navigator for Current Functional Status.   Therapy/Group: Individual Therapy  Leroy Libman 03/03/2018, 8:26 AM

## 2018-03-03 NOTE — Progress Notes (Signed)
[facility] PHYSICAL MEDICINE & REHABILITATION     PROGRESS NOTE    Subjective/Complaints: Up with OT working on putty. No new problems reported  ROS: Patient denies fever, rash, sore throat, blurred vision, nausea, vomiting, diarrhea, cough, shortness of breath or chest pain, joint or back pain, headache, or mood change.    Objective:  No results found. No results for input(s): WBC, HGB, HCT, PLT in the last 72 hours. No results for input(s): NA, K, CL, GLUCOSE, BUN, CREATININE, CALCIUM in the last 72 hours.  Invalid input(s): CO CBG (last 3)  Recent Labs    03/02/18 1655 03/02/18 2042 03/03/18 0700  GLUCAP 167* 168* 167*    Wt Readings from Last 3 Encounters:  02/22/18 72.3 kg (159 lb 4.8 oz)  02/17/18 72.6 kg (160 lb)  10/23/16 67.2 kg (148 lb 2.4 oz)     Intake/Output Summary (Last 24 hours) at 03/03/2018 1053 Last data filed at 03/03/2018 0842 Gross per 24 hour  Intake 590 ml  Output 940 ml  Net -350 ml    Vital Signs: Blood pressure 137/87, pulse 91, temperature 98.1 F (36.7 C), resp. rate 17, height 6\' 2"  (1.88 m), weight 72.3 kg (159 lb 4.8 oz), SpO2 99 %. Physical Exam:  Constitutional: No distress . Vital signs reviewed. HEENT: EOMI, oral membranes moist Neck: supple Cardiovascular: RRR without murmur. No JVD    Respiratory: CTA Bilaterally without wheezes or rales. Normal effort    GI: BS +, non-tender, non-distended  Musculoskeletal:  No edema or tenderness in extremities  Neurological: He is alert and oriented to person, place, and time.  Motor: Right upper extremity/right lower extremity: 4+/5 proximal to distal with decreased Bliss. Good sitting balance  LUE and LLE: 5/5 proximal to distal. Right sided sensory loss  Skin: Skin is warm and dry.  Psychiatric: He has a normal mood and affect. His behavior is normal.     Assessment/Plan: 1. 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 Physiatrist is providing close team supervision and 24 hour management of active medical problems listed below. Physiatrist and rehab team continue to assess barriers to discharge/monitor patient progress toward functional and medical goals.  Function:  Bathing Bathing position   Position: Shower  Bathing parts Body parts bathed by patient: Right arm, Right lower leg, Left arm, Chest, Left lower leg, Back, Abdomen, Buttocks, Right upper leg, Left upper leg, Front perineal area    Bathing assist Assist Level: Supervision or verbal cues      Upper Body Dressing/Undressing Upper body dressing   What is the patient wearing?: Pull over shirt/dress     Pull over shirt/dress - Perfomed by patient: Thread/unthread right sleeve, Thread/unthread left sleeve, Put head through opening, Pull shirt over trunk Pull over shirt/dress - Perfomed by helper: Pull shirt over trunk        Upper body assist Assist Level: Supervision or verbal cues      Lower Body Dressing/Undressing Lower body dressing   What is the patient wearing?: Underwear, Pants, Socks, Shoes Underwear - Performed by patient: Thread/unthread right underwear leg, Thread/unthread left underwear leg, Pull underwear up/down   Pants- Performed by patient: Thread/unthread right pants leg, Thread/unthread left pants leg, Pull pants up/down   Non-skid slipper socks- Performed by patient: Don/doff right sock, Don/doff left sock   Socks - Performed by patient: Don/doff right sock, Don/doff left sock Socks - Performed by helper: Don/doff  right sock, Don/doff left sock Shoes - Performed by patient: Don/doff right shoe, Don/doff left shoe, Fasten right, Fasten left Shoes - Performed by helper: Fasten right, Fasten left          Lower body assist Assist for lower body dressing: Touching or steadying assistance (Pt > 75%)      Toileting Toileting Toileting activity did not occur: No  continent bowel/bladder event Toileting steps completed by patient: Adjust clothing prior to toileting, Performs perineal hygiene, Adjust clothing after toileting Toileting steps completed by helper: Adjust clothing after toileting Toileting Assistive Devices: Grab bar or rail  Toileting assist Assist level: Supervision or verbal cues   Transfers Chair/bed transfer   Chair/bed transfer method: Stand pivot Chair/bed transfer assist level: Supervision or verbal cues Chair/bed transfer assistive device: Armrests, Medical sales representative     Max distance: 60 Assist level: Supervision or verbal cues   Wheelchair   Type: Manual Max wheelchair distance: 75 Assist Level: No help, No cues, assistive device, takes more than reasonable amount of time  Cognition Comprehension Comprehension assist level: Follows basic conversation/direction with no assist  Expression Expression assist level: Expresses basic needs/ideas: With no assist  Social Interaction Social Interaction assist level: Interacts appropriately with others - No medications needed.  Problem Solving Problem solving assist level: Solves complex problems: With extra time  Memory Memory assist level: Recognizes or recalls 50 - 74% of the time/requires cueing 25 - 49% of the time  Medical Problem List and Plan: 1.  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   -continue therapies  -appreciate neurology work up and recs--follow up in 4 weeks from admit   -DAPT for 3 weeks then ASA alone   -30 day cardiac monitoring recommended 2.  DVT Prophylaxis/Anticoagulation: Pharmaceutical: Lovenox 3. Pain Management: N/A 4. Mood: LCSW to follow for evaluation and support.  5. Neuropsych: This patient is capable of making decisions on his own behalf. 6. Skin/Wound Care: routine pressure relief measures.  7. Fluids/Electrolytes/Nutrition: Monitor I/O. Check lytes in am.  8. T1DM with DKA:  Multiple  episodes of DKA with Hgb A1C- 10.       -sugars showing some improvement over last 24-48hr  -Have  stressed the importance of diet/consistency repeatedly  -  mealtime covg 4u, TID with meals  - levemir: increased to 14 u bid on 6/13  - Dr. Dwyane Dee who saw this patient before is willing to see him again as an outpt.     -will make outpt referral   9. HTN: bp's trending up---increase norvasc to 10mg      -  CKD  l  -recheck Monday 10. Dyslipidemia: On lipitor 11. Leucocytosis: afebrile, wbc'x 7.7 12. Acute on chronic renal failure?: Encourage fluid intake.  13. Positive RPR/T Pallidum: Appreciate ID  help  -given risks of LP ID recs treatment   - continue IV Pen G q4 for 10 days total (6/6)---thru 6/15  -Weekly Bicillin IM x3 doses.  He is due again 6/13 and 6/20  LOS (Days) [place] EVALUATION WAS PERFORMED     Meredith Staggers, MD 03/03/2018 10:53 AM

## 2018-03-03 NOTE — Progress Notes (Signed)
Occupational Therapy Session Note  Patient Details  Name: Nathaniel [name] MRN: [id number] Date of Birth: May 31, 1986  Today's Date: 03/03/2018 OT Individual Time: 0800-0930 OT Individual Time Calculation (min): 90 min    Short Term Goals: Week 2:  OT Short Term Goal 1 (Week 2): STG=LTG secondary to ELOS  Skilled Therapeutic Interventions/Progress Updates:    Pt resting in bed upon arrival with IV running.  OT intervention with focus on RUE [facility], gross motor tasks, and strengthening.  Pt engaged in removing small beads from theraputty, threading large beads on string (holding beads in LUE and threading with RUE), handwriting, and strengthening with theraputty.  Pt independently followed handout for theraputty tasks. Pt remained seated EOB with bed alarm activated.   Therapy Documentation Precautions:  Precautions Precautions: Fall Precaution Comments: RLE ataxia and hemiparesis Restrictions Weight Bearing Restrictions: No Pain:  Pt denies pain  See Function Navigator for Current Functional Status.   Therapy/Group: Individual Therapy  Leroy Libman 03/03/2018, 9:33 AM

## 2018-03-03 NOTE — Progress Notes (Signed)
Physical Therapy Session Note  Patient Details  Name: Nathaniel [name] MRN: [id number] Date of Birth: 24-Jul-1986  Today's Date: 03/03/2018 PT Individual Time: 1000-1100 PT Individual Time Calculation (min): 60 min   Short Term Goals: Week 2:  PT Short Term Goal 1 (Week 2): =LTG due to ELOS  Skilled Therapeutic Interventions/Progress Updates:    Pt reported no pain or nausea prior to his treatment session this morning. PT initiated standing LE alternating hip flexion in // bars for 2 sets/20reps providing steadying assist and tubing for visual cue for performance of adequate hip flexion. PT provided min VC's for hip extension to improve standing posture and ankle DF for increased LE muscle activation. Pt required seated rest break in b/w each set. PT initiated step ups to 6 in step leading with B LE's for 2 sets/5 reps. Pt reported ascending with R LE was more difficult requiring PT to provide VC's for proper lateral weight shift to R LE to facilitate advancement of L LE onto step. Pt demo'd x2 episodes of  R LE knee buckling with ability to self recover without LOB. Pt ambulated 155' using RW and close supervision demonstrating slow reciprocal stepping pattern requiring tactile cues to perform hip extension to reduce forward flexed trunk posturing and optimize gait pattern. Pt was returned to his room with call bell and tray table in reach and all needs met.   Therapy Documentation Precautions:  Precautions Precautions: Fall Precaution Comments: RLE ataxia and hemiparesis Restrictions Weight Bearing Restrictions: No   See Function Navigator for Current Functional Status.   Therapy/Group: Individual Therapy  Floreen Comber 03/03/2018, 4:36 PM

## 2018-03-03 NOTE — Progress Notes (Signed)
Occupational Therapy Session Note  Patient Details  Name: Nathaniel [name] MRN: [id number] Date of Birth: 03/23/1986  Today's Date: 03/03/2018 OT Individual Time: 1595-3967 OT Individual Time Calculation (min): 54 min    Short Term Goals: Week 2:  OT Short Term Goal 1 (Week 2): STG=LTG secondary to ELOS  Skilled Therapeutic Interventions/Progress Updates:    OT intervention with focus on BADL retraining, functional amb with RW, functional transfers, activity tolerance, sit<>stand, standing balance, and safety awareness to increase independence with BADLs. Pt completed all bathing and dressing tasks at supervision level this afternoon.  Pt requires more than a reasonable amount of time to complete all tasks.  Pt amb with RW to bathroom at supervision level.  Pt required steady A for transfer into shower stall but transferred out without assistance.  Pt's actions are very deliberate and pt states he "just wants to be careful." Pt uses RUE at diminished level during all functional tasks.  Pt remained seated EOB awaiting RN to start IV. Bed alarm activated and all needs within reach.   Therapy Documentation Precautions:  Precautions Precautions: Fall Precaution Comments: RLE ataxia and hemiparesis Restrictions Weight Bearing Restrictions: No   Pain:  Pt denies pain  See Function Navigator for Current Functional Status.   Therapy/Group: Individual Therapy  Leroy Libman 03/03/2018, 1:59 PM

## 2018-03-04 ENCOUNTER — Inpatient Hospital Stay (HOSPITAL_COMMUNITY): Payer: Medicare HMO

## 2018-03-04 LAB — GLUCOSE, CAPILLARY
GLUCOSE-CAPILLARY: 106 mg/dL — AB (ref 65–99)
GLUCOSE-CAPILLARY: 388 mg/dL — AB (ref 65–99)
Glucose-Capillary: 215 mg/dL — ABNORMAL HIGH (ref 65–99)
Glucose-Capillary: 314 mg/dL — ABNORMAL HIGH (ref 65–99)

## 2018-03-04 MED ORDER — LIVING WELL WITH DIABETES BOOK
Freq: Once | Status: AC
  Administered 2018-03-04: 17:00:00
  Filled 2018-03-04 (×2): qty 1

## 2018-03-04 NOTE — Progress Notes (Signed)
Occupational Therapy Note  Patient Details  Name: Nathaniel [name] MRN: [id number] Date of Birth: 08-12-1986  Today's Date: 03/04/2018 OT Missed Time: 25 Minutes Missed Time Reason: Patient fatigue;Pain  Pt missed 45 mins skilled OT services.  Pt laying in bed with c/o shoulder pain and fatigue.  Pt stated he just needed to rest.  RN aware.   Leotis Shames [facility] 03/04/2018, 12:57 PM

## 2018-03-04 NOTE — Progress Notes (Signed)
Patient ID: Nathaniel [name], male   DOB: 08-01-1986, 32 y.o.   MRN: [id number]   Nathaniel [name] is a 32 y.o. male who is admitted for CIR with right-sided weakness and sensory deficits secondary to bilateral CVAs  Past Medical History:  Diagnosis Date  . Diabetes mellitus    lantus/novolog  . Hyperlipidemia   . Hypertension   . Marijuana abuse    occaisionally  . Noncompliance with medication regimen   . Tobacco abuse    5/day     Subjective: No new complaints. No new problems. Slept poorly.  Objective: Vital signs in last 24 hours: Temp:  [97.5 F (36.4 C)-98.1 F (36.7 C)] 98.1 F (36.7 C) (06/15 0514) Pulse Rate:  [91-109] 91 (06/15 0514) Resp:  [16-18] 16 (06/15 0514) BP: (118-183)/(70-108) 118/70 (06/15 0514) SpO2:  [100 %] 100 % (06/15 0514) Weight change:  Last BM Date: 03/03/18  Intake/Output from previous day: 06/14 0701 - 06/15 0700 In: 1266.7 [P.O.:440; I.V.:826.7] Out: 1200 [Urine:1200] Last cbgs: CBG (last 3)  Recent Labs    03/03/18 1632 03/03/18 2214 03/04/18 0706  GLUCAP 265* 261* 106*   Patient Vitals for the past 24 hrs:  BP Temp Temp src Pulse Resp SpO2  03/04/18 0514 118/70 98.1 F (36.7 C) - 91 16 100 %  03/03/18 2352 (!) 143/88 - - 91 - -  03/03/18 2217 (!) 183/108 (!) 97.5 F (36.4 C) - (!) 109 18 100 %  03/03/18 1819 119/75 98 F (36.7 C) Oral 91 18 100 %    Physical Exam General: No apparent distress   HEENT: Moist Lungs: Normal effort. Lungs clear to auscultation, no crackles or wheezes. Cardiovascular: Regular rate and rhythm, no edema Abdomen: S/NT/ND; BS(+) Musculoskeletal:  unchanged Neurological: No new neurological deficits with right-sided weakness.  Able to ambulate with a walker Extremities-no edema Skin: clear   Mental state: Alert, oriented, cooperative    Lab Results: BMET    Component Value Date/Time   NA 138 02/28/2018 0814   K 5.4 (H) 02/28/2018 0814   CL 107 02/28/2018 0814   CO2 23  02/28/2018 0814   GLUCOSE 146 (H) 02/28/2018 0814   BUN 21 (H) 02/28/2018 0814   CREATININE 1.71 (H) 02/28/2018 0814   CALCIUM 9.6 02/28/2018 0814   GFRNONAA 52 (L) 02/28/2018 0814   GFRAA 60 (L) 02/28/2018 0814   CBC    Component Value Date/Time   WBC 9.3 02/27/2018 0513   RBC 3.46 (L) 02/27/2018 0513   HGB 9.9 (L) 02/27/2018 0513   HCT 30.7 (L) 02/27/2018 0513   HCT 32.8 (L) 04/15/2016 1650   PLT 250 02/27/2018 0513   MCV 88.7 02/27/2018 0513   MCH 28.6 02/27/2018 0513   MCHC 32.2 02/27/2018 0513   RDW 13.9 02/27/2018 0513   LYMPHSABS 2.5 02/23/2018 0718   MONOABS 0.4 02/23/2018 0718   EOSABS 0.4 02/23/2018 0718   BASOSABS 0.1 02/23/2018 0718    Medications: I have reviewed the patient's current medications.  Assessment/Plan:  Functional deficits with right hemiparesis secondary to by cerebral infarcts.  Continue CIR T1DM.  Blood sugars remain quite labile.  Fasting blood sugar this morning 106 Hypertension.  Blood pressures remain labile.  Blood pressure well controlled this a.m. after titration of amlodipine dyslipidemia.  Continue statin therapy Positive RPR.  Continue penicillin treatment as outlined by ID     Length of stay, days: 10  Marletta Lor , MD 03/04/2018, 10:02 AM

## 2018-03-05 LAB — GLUCOSE, CAPILLARY
Glucose-Capillary: 389 mg/dL — ABNORMAL HIGH (ref 65–99)
Glucose-Capillary: 400 mg/dL — ABNORMAL HIGH (ref 65–99)
Glucose-Capillary: 266 mg/dL — ABNORMAL HIGH (ref 65–99)

## 2018-03-05 MED ORDER — INSULIN DETEMIR 100 UNIT/ML ~~LOC~~ SOLN
16.0000 [IU] | Freq: Two times a day (BID) | SUBCUTANEOUS | Status: DC
  Administered 2018-03-05 – 2018-03-08 (×6): 16 [IU] via SUBCUTANEOUS
  Filled 2018-03-05 (×7): qty 0.16

## 2018-03-05 NOTE — Progress Notes (Signed)
Patient ID: Nathaniel [name], male   DOB: [birth year], 32 y.o.   MRN: [id number]   Nathaniel [name] is a 32 y.o. male admitted for CIR with right-sided weakness and sensory deficits secondary to bilateral CVAs  Past Medical History:  Diagnosis Date  . Diabetes mellitus    lantus/novolog  . Hyperlipidemia   . Hypertension   . Marijuana abuse    occaisionally  . Noncompliance with medication regimen   . Tobacco abuse    5/day     Subjective: No new complaints. No new problems.  Mild GI upset yesterday which has resolved.  Comfortable night  Objective: Vital signs in last 24 hours: Temp:  [98.1 F (36.7 C)-98.4 F (36.9 C)] 98.4 F (36.9 C) (06/16 0511) Pulse Rate:  [85-118] 85 (06/16 0511) Resp:  [18] 18 (06/16 0511) BP: (131-157)/(78-89) 157/87 (06/16 0511) SpO2:  [100 %] 100 % (06/16 0511) Weight change:  Last BM Date: 03/03/18  Intake/Output from previous day: 06/15 0701 - 06/16 0700 In: 720 [P.O.:720] Out: 2125 [Urine:2125] Last cbgs: CBG (last 3)  Recent Labs    03/04/18 1131 03/04/18 1651 03/04/18 2121  GLUCAP 215* 388* 314*   Patient Vitals for the past 24 hrs:  BP Temp Pulse Resp SpO2  03/05/18 0511 (!) 157/87 98.4 F (36.9 C) 85 18 100 %  03/04/18 2019 (!) 154/89 98.3 F (36.8 C) (!) 118 18 100 %  03/04/18 1433 131/78 98.1 F (36.7 C) (!) 102 - 100 %     Physical Exam General: No apparent distress   HEENT: Oropharynx clear Lungs: Normal effort. Lungs clear to auscultation, no crackles or wheezes. Cardiovascular: Regular rate and rhythm, no edema Abdomen: S/NT/ND; BS(+) Musculoskeletal:  unchanged Neurological: No new neurological deficits with persistent right-sided weakness Extremities.  No edema Skin: clear   Mental state: Alert, oriented, cooperative    Lab Results: BMET    Component Value Date/Time   NA 138 02/28/2018 0814   K 5.4 (H) 02/28/2018 0814   CL 107 02/28/2018 0814   CO2 23 02/28/2018 0814   GLUCOSE 146 (H)  02/28/2018 0814   BUN 21 (H) 02/28/2018 0814   CREATININE 1.71 (H) 02/28/2018 0814   CALCIUM 9.6 02/28/2018 0814   GFRNONAA 52 (L) 02/28/2018 0814   GFRAA 60 (L) 02/28/2018 0814   CBC    Component Value Date/Time   WBC 9.3 02/27/2018 0513   RBC 3.46 (L) 02/27/2018 0513   HGB 9.9 (L) 02/27/2018 0513   HCT 30.7 (L) 02/27/2018 0513   HCT 32.8 (L) 04/15/2016 1650   PLT 250 02/27/2018 0513   MCV 88.7 02/27/2018 0513   MCH 28.6 02/27/2018 0513   MCHC 32.2 02/27/2018 0513   RDW 13.9 02/27/2018 0513   LYMPHSABS 2.5 02/23/2018 0718   MONOABS 0.4 02/23/2018 0718   EOSABS 0.4 02/23/2018 0718   BASOSABS 0.1 02/23/2018 0718     Medications: I have reviewed the patient's current medications.  Assessment/Plan:  Functional deficits with right-sided weakness following bilateral CVAs.  Continue CIR DVT prophylaxis.  Continue Lovenox T1DM with history of DKA.  Blood sugars remain consistently greater than 200.  Will titrate Levemir and continue mealtime coverage Essential hypertension.  Blood pressure seems to be trending up.  We will continue to observe.  Continue amlodipine 10 Chronic kidney disease.  Recheck labs in a.m.    Length of stay, days: 11  Marletta Lor , MD 03/05/2018, 9:15 AM

## 2018-03-06 ENCOUNTER — Inpatient Hospital Stay (HOSPITAL_COMMUNITY): Payer: Medicare HMO

## 2018-03-06 ENCOUNTER — Inpatient Hospital Stay (HOSPITAL_COMMUNITY): Payer: Medicare HMO | Admitting: Physical Therapy

## 2018-03-06 LAB — GLUCOSE, CAPILLARY
GLUCOSE-CAPILLARY: 248 mg/dL — AB (ref 65–99)
GLUCOSE-CAPILLARY: 320 mg/dL — AB (ref 65–99)
Glucose-Capillary: 70 mg/dL (ref 65–99)
Glucose-Capillary: 124 mg/dL — ABNORMAL HIGH (ref 65–99)

## 2018-03-06 LAB — CBC
HEMATOCRIT: 32.9 % — AB (ref 39.0–52.0)
HEMOGLOBIN: 10.8 g/dL — AB (ref 13.0–17.0)
MCH: 28.4 pg (ref 26.0–34.0)
MCHC: 32.8 g/dL (ref 30.0–36.0)
MCV: 86.6 fL (ref 78.0–100.0)
Platelets: 282 10*3/uL (ref 150–400)
RBC: 3.8 MIL/uL — ABNORMAL LOW (ref 4.22–5.81)
RDW: 13.5 % (ref 11.5–15.5)
WBC: 9.6 10*3/uL (ref 4.0–10.5)

## 2018-03-06 LAB — BASIC METABOLIC PANEL
ANION GAP: 7 (ref 5–15)
BUN: 37 mg/dL — ABNORMAL HIGH (ref 6–20)
CHLORIDE: 107 mmol/L (ref 101–111)
CO2: 25 mmol/L (ref 22–32)
Calcium: 9.6 mg/dL (ref 8.9–10.3)
Creatinine, Ser: 2.04 mg/dL — ABNORMAL HIGH (ref 0.61–1.24)
GFR, EST AFRICAN AMERICAN: 48 mL/min — AB (ref >60)
GFR, EST NON AFRICAN AMERICAN: 42 mL/min — AB (ref >60)
Glucose, Bld: 82 mg/dL (ref 65–99)
POTASSIUM: 4.1 mmol/L (ref 3.5–5.1)
SODIUM: 139 mmol/L (ref 135–145)

## 2018-03-06 MED ORDER — MUSCLE RUB 10-15 % EX CREA
TOPICAL_CREAM | CUTANEOUS | Status: DC | PRN
  Administered 2018-03-06: 18:00:00 via TOPICAL
  Filled 2018-03-06: qty 85

## 2018-03-06 NOTE — Progress Notes (Signed)
Physical Therapy Session Note  Patient Details  Name: Nathaniel [name] MRN: [id number] Date of Birth: [birth year]  Today's Date: 03/06/2018 PT Individual Time: 1411-1506 PT Individual Time Calculation (min): 55 min   Short Term Goals: Week 2:  PT Short Term Goal 1 (Week 2): =LTG due to ELOS   Skilled Therapeutic Interventions/Progress Updates:  Pt received in w/c, requesting a minute to utilize urinal, and did so with Mod I with continent void. No c/o pain reported. Pt transported to/from gym via w/c total assist for time management. Discussed signs of another stroke, as well as stroke risk factors so pt is aware upon d/c. Pt ambulates 100 ft with RW & supervision, then negotiates obstacle course (3" step, stepping over poles, weaving between cones) with supervision. Pt ambulates at a significantly decreased gait speed and forward flexed posture, with decreased dorsiflexion RLE which pt compensates for by increased R hip/knee flexion, pt with minimal to no R heel strike. Educated pt on removing any obstacles in floor to reduce tripping hazards at home; also educated on sequencing for negotiating 3" step in obstacle course. Pt very appreciative of all education during session. At end of session pt left sitting in w/c with chair alarm donned & needs within reach.  Therapy Documentation Precautions:  Precautions Precautions: Fall Precaution Comments: RLE ataxia and hemiparesis Restrictions Weight Bearing Restrictions: No   See Function Navigator for Current Functional Status.   Therapy/Group: Individual Therapy  Waunita Schooner 03/06/2018, 3:08 PM

## 2018-03-06 NOTE — Progress Notes (Signed)
[facility] PHYSICAL MEDICINE & REHABILITATION     PROGRESS NOTE    Subjective/Complaints: Developed soreness in right shoulder over weekend  ROS: Patient denies fever, rash, sore throat, blurred vision, nausea, vomiting, diarrhea, cough, shortness of breath or chest pain, joint or back pain, headache, or mood change.   Objective:  No results found. Recent Labs    03/06/18 0525  WBC 9.6  HGB 10.8*  HCT 32.9*  PLT 282   Recent Labs    03/06/18 0525  NA 139  K 4.1  CL 107  GLUCOSE 82  BUN 37*  CREATININE 2.04*  CALCIUM 9.6   CBG (last 3)  Recent Labs    03/05/18 2055 03/06/18 0653 03/06/18 1130  GLUCAP 266* 70 248*    Wt Readings from Last 3 Encounters:  02/22/18 72.3 kg (159 lb 4.8 oz)  02/17/18 72.6 kg (160 lb)  10/23/16 67.2 kg (148 lb 2.4 oz)     Intake/Output Summary (Last 24 hours) at 03/06/2018 1625 Last data filed at 03/06/2018 1250 Gross per 24 hour  Intake 600 ml  Output 1600 ml  Net -1000 ml    Vital Signs: Blood pressure 140/90, pulse 78, temperature 97.8 F (36.6 C), temperature source Oral, resp. rate 18, height 6\' 2"  (1.88 m), weight 72.3 kg (159 lb 4.8 oz), SpO2 100 %. Physical Exam:  Constitutional: No distress . Vital signs reviewed. HEENT: EOMI, oral membranes moist Neck: supple Cardiovascular: RRR without murmur. No JVD    Respiratory: CTA Bilaterally without wheezes or rales. Normal effort    GI: BS +, non-tender, non-distended  Musculoskeletal:  No edema or tenderness in extremities  Neurological: He is alert and oriented to person, place, and time.  Motor: Right upper extremity/right lower extremity: 4+/5 proximal to distal with decreased [place]. Good sitting balance  LUE and LLE: 5/5 proximal to distal. Right sided sensory loss  Skin: Skin is warm and dry.  Psychiatric: He has a normal mood and affect. His behavior is normal.     Assessment/Plan: 1. Right hemiparesis secondary to bi-cerebral infarcts which require 3+ hours  per day of interdisciplinary therapy in a comprehensive inpatient rehab setting. Physiatrist is providing close team supervision and 24 hour management of active medical problems listed below. Physiatrist and rehab team continue to assess barriers to discharge/monitor patient progress toward functional and medical goals.  Function:  Bathing Bathing position   Position: Shower  Bathing parts Body parts bathed by patient: Right arm, Right lower leg, Left arm, Chest, Left lower leg, Back, Abdomen, Buttocks, Right upper leg, Left upper leg, Front perineal area    Bathing assist Assist Level: Supervision or verbal cues      Upper Body Dressing/Undressing Upper body dressing   What is the patient wearing?: Pull over shirt/dress     Pull over shirt/dress - Perfomed by patient: Thread/unthread right sleeve, Thread/unthread left sleeve, Put head through opening, Pull shirt over trunk Pull over shirt/dress - Perfomed by helper: Pull shirt over trunk        Upper body assist Assist Level: Supervision or verbal cues      Lower Body Dressing/Undressing Lower body dressing   What is the patient wearing?: Underwear, Pants, Socks, Shoes Underwear - Performed by patient: Thread/unthread right underwear leg, Thread/unthread left underwear leg, Pull underwear up/down   Pants- Performed by patient: Thread/unthread right pants leg, Thread/unthread left pants leg, Pull pants up/down   Non-skid slipper socks- Performed by patient: Don/doff right sock, Don/doff left sock  Socks - Performed by patient: Don/doff right sock, Don/doff left sock Socks - Performed by helper: Don/doff right sock, Don/doff left sock Shoes - Performed by patient: Don/doff right shoe, Don/doff left shoe, Fasten right, Fasten left Shoes - Performed by helper: Fasten right, Fasten left          Lower body assist Assist for lower body dressing: Supervision or verbal cues      Toileting Toileting Toileting activity did  not occur: No continent bowel/bladder event Toileting steps completed by patient: Adjust clothing prior to toileting Toileting steps completed by helper: Performs perineal hygiene, Adjust clothing after toileting Toileting Assistive Devices: Grab bar or rail  Toileting assist Assist level: Supervision or verbal cues   Transfers Chair/bed transfer   Chair/bed transfer method: Stand pivot Chair/bed transfer assist level: Supervision or verbal cues Chair/bed transfer assistive device: Armrests, Medical sales representative     Max distance: 100 ft  Assist level: Supervision or verbal cues   Wheelchair   Type: Manual Max wheelchair distance: 75 Assist Level: No help, No cues, assistive device, takes more than reasonable amount of time  Cognition Comprehension Comprehension assist level: Understands complex 90% of the time/cues 10% of the time  Expression Expression assist level: Expresses complex 90% of the time/cues < 10% of the time  Social Interaction Social Interaction assist level: Interacts appropriately 90% of the time - Needs monitoring or encouragement for participation or interaction.  Problem Solving Problem solving assist level: Solves complex problems: With extra time  Memory Memory assist level: Recognizes or recalls 90% of the time/requires cueing < 10% of the time  Medical Problem List and Plan: 1.  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   -continue therapies  -appreciate neurology work up and recs--follow up in 4 weeks from admit   -DAPT for 3 weeks then ASA alone   -30 day cardiac monitoring recommended 2.  DVT Prophylaxis/Anticoagulation: Pharmaceutical: Lovenox 3. Pain Management: N/A 4. Mood: LCSW to follow for evaluation and support.  5. Neuropsych: This patient is capable of making decisions on his own behalf. 6. Skin/Wound Care: routine pressure relief measures.  7. Fluids/Electrolytes/Nutrition: Monitor I/O.   8. T1DM  with DKA:  Multiple episodes of DKA with Hgb A1C- 10.       -   -  mealtime covg 4u, TID with meals  - levemir: increased to 16 u bid    - Dr. Dwyane Dee who saw this patient before is willing to see him again as an outpt.     -will make outpt referral   9. HTN: continue norvasc to 10mg      -  CKD  l  -bun/cr trending up. Hold ACE, re-check tomorrow  -will need outpt follow up of kidney function  10. Dyslipidemia: On lipitor 11. Leucocytosis: afebrile, wbc'x 7.7 12. Acute on chronic renal failure?: Encourage fluid intake.  13. Positive RPR/T Pallidum: Appreciate ID  help  -given risks of LP ID recs treatment   -   IV Pen G q4 for 10 days total (6/6)-- now complete  -Weekly Bicillin IM x3 doses.  He is due again   6/20 ( will give him a dose on 6/19 the day of discharge)  LOS (Days) [place]     Meredith Staggers, MD 03/06/2018 4:25 PM

## 2018-03-06 NOTE — Progress Notes (Signed)
Occupational Therapy Session Note  Patient Details  Name: Nathaniel [name] MRN: [id number] Date of Birth: 07/10/1986  Today's Date: 03/06/2018 OT Individual Time: 1100-1145 OT Individual Time Calculation (min): 45 min  and Today's Date: 03/06/2018 OT Missed Time: 33 Minutes Missed Time Reason: Patient unwilling/refused to participate without medical reason(visiting with family)   Short Term Goals: Week 2:  OT Short Term Goal 1 (Week 2): STG=LTG secondary to ELOS  Skilled Therapeutic Interventions/Progress Updates:    Pt resting in w/c upon arrival with family present.  Pt stated his family was visiting and he would "take a shower later." continued with discharge planning and family education.  Recommended 24 hour supervision. Family and pt verbalized understanding.  Pt missed 45 mins skilled OT services 2/2 pt wishing to visit with family.   Therapy Documentation Precautions:  Precautions Precautions: Fall Precaution Comments: RLE ataxia and hemiparesis Restrictions Weight Bearing Restrictions: No General: General OT Amount of Missed Time: 45 Minutes    Pain:  Pt c/o R shoulder discomfort noted but improved  See Function Navigator for Current Functional Status.   Therapy/Group: Individual Therapy  Leroy Libman 03/06/2018, 11:58 AM

## 2018-03-06 NOTE — Progress Notes (Signed)
Physical Therapy Session Note  Patient Details  Name: Nathaniel [name] MRN: [id number] Date of Birth: August 29, 1986  Today's Date: 03/06/2018 PT Individual Time: 0900-1000 PT Individual Time Calculation (min): 60 min   Short Term Goals: Week 2:  PT Short Term Goal 1 (Week 2): =LTG due to ELOS  Skilled Therapeutic Interventions/Progress Updates:    Pt reported R shoulder pain near anterior deltoid region prior to the start of his treatment session indicating he was waiting on nursing to bring him Bengay or "muscle rub" to alleviate pain and inflammation. PT educated Pt regarding the benefits of kinesio-taping to alleviate pain symptoms and allow for improved mobility to R shoulder. Pt indicated willingness to try kinesio-taping and will use the cream provided by nursing if the tape fails to alleviate pain symptoms. PT applied kinesio-tape with support strip on posterior deltoid and pec with decompression strip across [facility] joint. Educated on signs of intolerance and contraindications. Today's treatment session focusing on LE strengthening and dynamic standing balance. Pt performed hook-lying bridging with green t-band for greater hip abductor activation with exercise performance for 2 sets/ 20 reps. Pt then performed dynamic standing balance activity incorporating functional reaching and tossing of the bean bag to placed target with steadying assist provided by therapist t/o task duration. Pt noted increased difficulty with tossing bean bag with L UE due to lack of sensation and fine motor movement. Pt consistently performs stand pivot transfers t/o treatment duration from w/c <> EOM with RW with supervision. Pt was returned to his room with call bell and tray table in reach and all needs met.  Therapy Documentation Precautions:  Precautions Precautions: Fall Precaution Comments: RLE ataxia and hemiparesis Restrictions Weight Bearing Restrictions: No   See Function Navigator for Current Functional  Status.   Therapy/Group: Individual Therapy  Floreen Comber 03/06/2018, 12:52 PM

## 2018-03-07 ENCOUNTER — Inpatient Hospital Stay (HOSPITAL_COMMUNITY): Payer: Medicare HMO | Admitting: Physical Therapy

## 2018-03-07 ENCOUNTER — Inpatient Hospital Stay (HOSPITAL_COMMUNITY): Payer: Medicare HMO

## 2018-03-07 LAB — GLUCOSE, CAPILLARY
GLUCOSE-CAPILLARY: 162 mg/dL — AB (ref 65–99)
GLUCOSE-CAPILLARY: 334 mg/dL — AB (ref 65–99)
GLUCOSE-CAPILLARY: 390 mg/dL — AB (ref 65–99)
Glucose-Capillary: 381 mg/dL — ABNORMAL HIGH (ref 65–99)

## 2018-03-07 LAB — BASIC METABOLIC PANEL
ANION GAP: 9 (ref 5–15)
BUN: 39 mg/dL — ABNORMAL HIGH (ref 6–20)
CO2: 22 mmol/L (ref 22–32)
Calcium: 9.5 mg/dL (ref 8.9–10.3)
Chloride: 107 mmol/L (ref 101–111)
Creatinine, Ser: 2.06 mg/dL — ABNORMAL HIGH (ref 0.61–1.24)
GFR calc Af Amer: 48 mL/min — ABNORMAL LOW (ref >60)
GFR, EST NON AFRICAN AMERICAN: 41 mL/min — AB (ref >60)
GLUCOSE: 190 mg/dL — AB (ref 65–99)
POTASSIUM: 4.4 mmol/L (ref 3.5–5.1)
Sodium: 138 mmol/L (ref 135–145)

## 2018-03-07 MED ORDER — MUSCLE RUB 10-15 % EX CREA: 1.0000 "application " | TOPICAL_CREAM | CUTANEOUS | 0 refills | Status: DC | PRN

## 2018-03-07 MED ORDER — PENICILLIN G BENZATHINE 1200000 UNIT/2ML IM SUSP
2.4000 10*6.[IU] | Freq: Once | INTRAMUSCULAR | Status: AC
  Administered 2018-03-08: 2.4 10*6.[IU] via INTRAMUSCULAR
  Filled 2018-03-07: qty 4

## 2018-03-07 MED ORDER — ASPIRIN 81 MG PO TBEC: 81.0000 mg | DELAYED_RELEASE_TABLET | Freq: Every day | ORAL | 0 refills | Status: DC

## 2018-03-07 MED ORDER — CLOPIDOGREL BISULFATE 75 MG PO TABS: 75.0000 mg | ORAL_TABLET | Freq: Every day | ORAL | 0 refills | Status: DC

## 2018-03-07 MED ORDER — ACETAMINOPHEN 325 MG PO TABS: 325.0000 mg | ORAL_TABLET | ORAL | Status: DC | PRN

## 2018-03-07 MED ORDER — AMLODIPINE BESYLATE 10 MG PO TABS: 10.0000 mg | ORAL_TABLET | Freq: Every day | ORAL | 0 refills | Status: DC

## 2018-03-07 MED ORDER — ATORVASTATIN CALCIUM 80 MG PO TABS: 80.0000 mg | ORAL_TABLET | Freq: Every day | ORAL | 0 refills | Status: DC

## 2018-03-07 NOTE — Progress Notes (Signed)
Physical Therapy Discharge Summary  Patient Details  Name: Nathaniel [name] MRN: [id number] Date of Birth: 1986-03-02  Today's Date: 03/07/2018 PT Individual Time: 0900-1000 PT Individual Time Calculation (min): 60 min    Patient has met 8 of 8 long term goals due to improved activity tolerance, improved balance, improved postural control, increased strength, decreased pain, ability to compensate for deficits, improved awareness and improved coordination.  Patient to discharge at an ambulatory level Modified Independent.   Patient's care partner is independent to provide the necessary physical assistance at discharge.  Skilled Intervention: Pt reported decreased R shoulder pain prior to the start of his session attributing pain relief 2/2 kinesiotaping. Pt demonstrated ability to self propel w/c utilizing UE's and LE's for 150 feet Mod I. Ambulated 150 feet Mod I with improved control and confidence with ambulation. Pt performed stairs with step to pattern for 12 steps ascending with L LE and descending with R LE with supervision for safety consideration with task completion. PT demonstrated optimal floor recovery technique and Pt able to demonstrate and verbalize fall recovery successfully indicating it did not feel "uncomfortable" or "new" to him. Pt was educated about safe discharge and how to navigate around his environment removing unnecessary obstacles and maintaining proximity to RW t/o ambulation and transfers. Pt demo'd improvement in balance, gait, motor control and coordination and readiness for home d/c. Pt was returned to his room with call bell and tray table in reach and all needs met.   Recommendation:  Patient will benefit from ongoing skilled PT services in home health setting to continue to advance safe functional mobility, address ongoing impairments in motor coordination, motor control, balance, strength, ambulation, cognition and minimize fall risk.  Equipment: rolling  walker and w/c  Reasons for discharge: treatment goals met  Patient/family agrees with progress made and goals achieved: Yes  PT Discharge Precautions/Restrictions Precautions Precautions: Fall Precaution Comments: RLE ataxia and hemiparesis Restrictions Weight Bearing Restrictions: No Pain Pain Assessment Pain Scale: 0-10 Pain Score: 2  Pain Type: Acute pain Pain Location: Shoulder Pain Orientation: Right Pain Descriptors / Indicators: Aching;Tightness Pain Onset: Gradual Pain Intervention(s): (PT applied kinesiotape on 6/17 which Pt states has helped alleviate pain symptoms) Multiple Pain Sites: No Vision/Perception  Perception Perception: Within Functional Limits Praxis Praxis: Impaired Praxis Impairment Details: Motor planning  Cognition Overall Cognitive Status: Within Functional Limits for tasks assessed Arousal/Alertness: Awake/alert Orientation Level: Oriented X4 Memory: Appears intact Awareness: Appears intact Problem Solving: Impaired Problem Solving Impairment: Verbal complex;Functional complex Safety/Judgment: Appears intact Sensation Sensation Light Touch: Impaired Detail Light Touch Impaired Details: Impaired RLE(dec sensation in R LE with difficulty identifying LT distal>proximal) Hot/Cold: Not tested Proprioception: Not tested Stereognosis: Not tested Coordination Gross Motor Movements are Fluid and Coordinated: Yes Fine Motor Movements are Fluid and Coordinated: Not tested Heel Shin Test: R=L Motor  Motor Motor: Ataxia;Hemiplegia;Motor apraxia Motor - Discharge Observations: Pt demonstrates difficulty with sequencing movements during floor recovery activity requiring VC's from PT for safe extremity placement  Mobility Transfers Transfers: Stand Pivot Transfers Sit to Stand: Independent with assistive device Stand to Sit: Independent with assistive device Stand Pivot Transfers: Independent with assistive device Transfer (Assistive device):  Rolling walker Locomotion  Gait Ambulation: Yes Gait Assistance: Independent with assistive device Gait Distance (Feet): 150 Feet Assistive device: Rolling walker Gait Assistance Details: Pt continues to demonstrate slow gait speed but improved motor control Gait Gait: Yes Gait velocity: slow Stairs / Additional Locomotion Stairs: Yes Stairs Assistance: Supervision/Verbal cueing Stair Management Technique: Two  rails Number of Stairs: 12 Height of Stairs: 6 Curb: Supervision/Verbal Location manager Mobility: Yes Wheelchair Assistance: Independent with Camera operator: Both upper extremities;Both lower extermities Wheelchair Parts Management: Needs assistance;Other (comment)(requires assistance for leg rest application/removal ) Distance: 150  Trunk/Postural Assessment  Cervical Assessment Cervical Assessment: Within Functional Limits Thoracic Assessment Thoracic Assessment: Within Functional Limits Lumbar Assessment Lumbar Assessment: Within Functional Limits Postural Control Postural Control: Within Functional Limits  Balance Balance Balance Assessed: No Extremity Assessment  RUE Assessment RUE Assessment: Not tested LUE Assessment LUE Assessment: Not tested RLE Assessment RLE Assessment: Exceptions to [facility] General Strength Comments: strength 4/5 WFL but Pt demonstrates improved motor control with isolated movements LLE Assessment LLE Assessment: Within Functional Limits   See Function Navigator for Current Functional Status.  Floreen Comber 03/07/2018, 10:06 AM

## 2018-03-07 NOTE — Progress Notes (Signed)
Occupational Therapy Discharge Summary  Patient Details  Name: Nathaniel [name] MRN: [id number] Date of Birth: [birth year]  Today's Date: 03/07/2018      Patient has met 12 of 12 long term goals due to improved activity tolerance, improved balance, postural control, ability to compensate for deficits, functional use of  RIGHT upper, RIGHT lower, LEFT upper and LEFT lower extremity, improved awareness and improved coordination.  Pt made steady progress with BADLs during this admission.  Pt is mod I/I with assistive device for bathing/dressing and toileting tasks.  Pt completes simple meal prep and laundry tasks without assistance.  Pt exhibits safe behaviors during BADLs. Pt requires more than a reasonable amount of time to complete all tasks. Patient to discharge at overall Modified Independent level.  Patient's care partner is independent to provide the necessary physical assistance at discharge.      Recommendation:  Patient will benefit from ongoing skilled OT services in home health setting to continue to advance functional skills in the area of BADL, iADL and Reduce care partner burden.  Equipment: tub transfer bench  Reasons for discharge: treatment goals met and discharge from hospital  Patient/family agrees with progress made and goals achieved: Yes  OT Discharge PPain Pain Assessment Pain Scale: 0-10 Pain Score: 2  Pain Location: Abdomen Pain Orientation: Anterior Pain Descriptors / Indicators: Aching Patients Stated Pain Goal: 0 Pain Intervention(s): Medication (See eMAR)   Vision Baseline Vision/History: Wears glasses Wears Glasses: Reading only Patient Visual Report: No change from baseline Vision Assessment?: No apparent visual deficits Perception  Perception: Within Functional Limits Praxis Praxis: Impaired Praxis Impairment Details: Motor planning Cognition Overall Cognitive Status: Within Functional Limits for tasks assessed Arousal/Alertness:  Awake/alert Orientation Level: Oriented X4 Attention: Sustained Sustained Attention: Appears intact Memory: Appears intact Awareness: Appears intact Problem Solving: Impaired Problem Solving Impairment: Verbal complex;Functional complex Safety/Judgment: Appears intact Sensation Sensation Light Touch: Impaired Detail Light Touch Impaired Details: Impaired RLE;Impaired RUE Hot/Cold: Appears Intact Proprioception: Appears Intact Stereognosis: Not tested Additional Comments: Reports "tingling/numbness" in R shoulder to hand as well as R LE Coordination Gross Motor Movements are Fluid and Coordinated: Yes Fine Motor Movements are Fluid and Coordinated: No    Trunk/Postural Assessment  Cervical Assessment Cervical Assessment: Within Functional Limits Thoracic Assessment Thoracic Assessment: Within Functional Limits Lumbar Assessment Lumbar Assessment: Within Functional Limits Postural Control Postural Control: Within Functional Limits  Balance Static Sitting Balance Static Sitting - Balance Support: Feet supported;No upper extremity supported Static Sitting - Level of Assistance: 6: Modified independent (Device/Increase time) Dynamic Sitting Balance Dynamic Sitting - Balance Support: During functional activity;Feet supported Dynamic Sitting - Level of Assistance: 6: Modified independent (Device/Increase time) Extremity/Trunk Assessment RUE Assessment RUE Assessment: Exceptions to [facility] Active Range of Motion (AROM) Comments: WFL General Strength Comments: 4/5 throughout RUE Body System: Neuro Brunstrum levels for arm and hand: Arm;Hand Brunstrum level for arm: Stage V Relative Independence from Synergy Brunstrum level for hand: Stage VI Isolated joint movements LUE Assessment LUE Assessment: Within Functional Limits   See Function Navigator for Current Functional Status.  Leotis Shames [facility] 03/07/2018, 3:07 PM

## 2018-03-07 NOTE — Progress Notes (Signed)
Results for KAIN, MILOSEVIC (MRN [id number]) as of 03/07/2018 12:53  Ref. Range 03/06/2018 11:30 03/06/2018 16:38 03/06/2018 21:01 03/07/2018 06:58 03/07/2018 11:34  Glucose-Capillary Latest Ref Range: 65 - 99 mg/dL 248 (H) 320 (H) 124 (H) 162 (H) 334 (H)  Noted that postprandial blood sugars continue to be elevated. Recommend adding Novolog 4 units meal coverage TID (current orders are for lunch and supper) if blood sugar > 80 mg/dl and eats at least 50% of meals.  Harvel Ricks RN BSN CDE Diabetes Coordinator Pager: [phone]  8am-5pm

## 2018-03-07 NOTE — Progress Notes (Addendum)
Occupational Therapy Session Note  Patient Details  Name: Nathaniel [name] MRN: [id number] Date of Birth: [birth year]  Today's Date: 03/07/2018 OT Individual Time: 3009-2330 OT Individual Time Calculation (min): 58 min    Short Term Goals: Week 2:  OT Short Term Goal 1 (Week 2): STG=LTG secondary to ELOS  Skilled Therapeutic Interventions/Progress Updates:    Pt engaged in ongoing BADL retraining with focus on safety awareness, standing balance, functional amb with RW, and activity tolerance to increase independence with BADLs.  Pt amb with RW to gather clothing prior to entering bathroom for shower.  Pt completed shower and dressing tasks without assistance at mod I/I with assistive device.  Pt requires more than a reasonable amount of time to complete all tasks.  Pt remained in w/c with all needs within reach. Pt transitioned to ADL apartment for simple snack prep and kitchen tasks.  Pt also simulated laundry tasks at mod I level. Pt pleased with progress and ready for discharge tomorrow.   Therapy Documentation Precautions:  Precautions Precautions: Fall Precaution Comments: RLE ataxia and hemiparesis Restrictions Weight Bearing Restrictions: No Pain:  Pt denies pain  See Function Navigator for Current Functional Status.   Therapy/Group: Individual Therapy  Leroy Libman 03/07/2018, 9:02 AM

## 2018-03-07 NOTE — Discharge Summary (Signed)
Physician Discharge Summary  Patient ID: Nathaniel [name] MRN: [id number] DOB/AGE: Dec 14, 1985 32 y.o.  Admit date: 02/22/2018 Discharge date: 03/08/2018  Discharge Diagnoses:  Principal Problem:   Acute ischemic left MCA stroke [facility]) Active Problems:   Smoker   Chronic hypertension   Hypoglycemia   Neurosyphilis   Mixed hyperlipidemia   Stage 3 chronic kidney disease ([place])   Discharged Condition: stable   Significant Diagnostic Studies:  Mr Brain 37 Contrast  Result Date: 02/17/2018 CLINICAL DATA:  32 year old male with progressive right side numbness in the arm and leg since yesterday. Diabetes. EXAM: MRI HEAD WITHOUT CONTRAST MRA HEAD WITHOUT CONTRAST TECHNIQUE: Multiplanar, multiecho pulse sequences of the brain and surrounding structures were obtained without intravenous contrast. Angiographic images of the head were obtained using MRA technique without contrast. COMPARISON:  Head CT without contrast 02/16/2018. Cervical spine MRI 11/19/2015. Brain MRI 01/09/2014. FINDINGS: MRI HEAD FINDINGS Brain: Oval 17 millimeter focus of restricted diffusion in the lateral left thalamus and posterior limb left internal capsule as seen on series 5, image 49. Associated T2 and FLAIR hyperintensity. No hemorrhage or mass effect. There are also 2 smaller, up to 10 millimeters, subcortical white matter foci of restricted diffusion in the anterior left frontal lobe (series 3, image 82). Similar T2 and FLAIR hyperintensity with no hemorrhage or mass effect. Furthermore, there is a 12 millimeter area of abnormal trace diffusion in the right superior cerebellum (series 3, image 73) which appears T2 and FLAIR hyperintense but isointense on ADC compatible with a small subacute infarct. No hemorrhage or mass effect. Multiple chronic small lacunar infarcts bilateral cerebellum and dorsal right pons are new since 2013. Outside of the acute findings the deep gray matter nuclei remain normal. Periventricular white  matter T2 and FLAIR hyperintensity has progressed since 2013 outside of the acute areas, including the right periatrial white matter on series 13, image 25. No cerebral cortical encephalomalacia identified. No chronic cerebral blood products identified. No midline shift, mass effect, evidence of mass lesion, ventriculomegaly, extra-axial collection or acute intracranial hemorrhage. Cervicomedullary junction and pituitary are within normal limits. Vascular: Major intracranial vascular flow voids are stable since 2015. Skull and upper cervical spine: Negative visible cervical spine. Visualized bone marrow signal is within normal limits. Sinuses/Orbits: Stable orbits soft tissues with postoperative changes to both globes. Mild paranasal sinus mucosal thickening has regressed. Other: Chronic left mastoid effusion is stable to mildly improved. Trace right mastoid fluid is stable. Grossly normal visible internal auditory structures. Grossly negative scalp and face soft tissues. MRA HEAD FINDINGS Antegrade flow in the posterior circulation with codominant distal vertebral arteries. No stenosis. Normal left PICA origin. The right AICA may be dominant. Patent vertebrobasilar junction and basilar artery without stenosis. Patent SCA and PCA origins. Both posterior communicating arteries are present. Bilateral PCA branches are within normal limits. Antegrade flow in both ICA siphons with atherosclerotic versus artifactual bilateral cavernous and proximal supraclinoid siphon irregularity and narrowing (series 103, image 7). Normal ophthalmic and posterior communicating artery origins. Normal appearance of the supraclinoid ICAs. Patent carotid termini. Normal MCA and ACA origins. Anterior communicating artery and visible ACA branches are normal. The left MCA bifurcates early. The left M1, bifurcation, and visible left MCA branches are within normal limits. The right MCA M1, bifurcation, and visible right MCA branches are within  normal limits. IMPRESSION: 1. Acute lacunar infarct of the Left lateral thalamus/internal capsule corresponding to the CT finding yesterday. No associated hemorrhage or mass effect. 2. Additional small acute lacunar infarcts  in the anterior left frontal lobe subcortical white matter (Left MCA territory). Subacute infarct of the Right superior cerebellum (Right SCA territory). No hemorrhage or mass effect. 3. Multiple underlying chronic lacunar infarcts in the cerebellum and the right pons are new since 2015. 4. Intracranial MRA remarkable for moderate irregularity and stenosis of the bilateral cavernous ICAs. This is suspicious for advanced ICA atherosclerosis in this clinical setting, but might be exacerbated by artifact from hyperplastic sphenoid paranasal sinuses. Electronically Signed   By: Genevie Ann M.D.   On: 02/17/2018 07:30    Labs:  Basic Metabolic Panel: Recent Labs  Lab 03/06/18 0525 03/07/18 0628 03/08/18 0602  NA 139 138 137  K 4.1 4.4 4.2  CL 107 107 108  CO2 25 22 22   GLUCOSE 82 190* 278*  BUN 37* 39* 40*  CREATININE 2.04* 2.06* 1.99*  CALCIUM 9.6 9.5 9.0    CBC: Recent Labs  Lab 03/06/18 0525  WBC 9.6  HGB 10.8*  HCT 32.9*  MCV 86.6  PLT 282    CBG: Recent Labs  Lab 03/07/18 1134 03/07/18 1625 03/07/18 2113 03/08/18 0636 03/08/18 1228  GLUCAP 334* 390* 381* 274* 259*    Brief HPI:   Nathaniel [name] is a 32 year old male with history of T1DM--poorly controlled, HTN, non-compliance who was admitted on 02/16/18 with progressive right sided weakness and numbness. He was found to have DKA with BS 840 as well s acute on chronic renal failure. He was treated with DKA protocol and IVF with improvement. MRI brain done revealing acute lacunar infarct of left lateral thalamus and internal capsule, additional small acute lacunar infarct and subcortical left frontal lobe, subacute infarct in right superior cerebellum and multiple chronic lacunae.  Carotid Dopplers were  negative for ICA stenosis.  2D echo showed EF of 55 to 60% and no wall abnormality noted.  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  He continued to have issues with malignant hypertension as well as wide fluctuations in blood sugars.  Patient was noted to have decline in functional status and was limited by right-sided weakness with sensory deficits.  CIR was recommended for follow-up therapy.   Hospital Course: Nathaniel [name] was admitted to rehab 02/22/2018 for inpatient therapies to consist of PT  and OT at least three hours five days a week. Past admission physiatrist, therapy team and rehab RN have worked together to provide customized collaborative inpatient rehab. His blood sugars continued to be labile in part due to dietary indiscretion and he has been educated multiple times on appropriate diet, importance of risk factor modification as well as compliance.  Meal coverage has been decreased to prevent hypoglycemic episodes.  Blood pressures control has improved with increase in Norvasc. Lisinopril was added briefly but discontinued due to worsening of renal status.  He is continent of bowel and bladder.     Neurology and ID was consulted for input due to bicerebral strokes with positive RPR. Dr. Erlinda Hong recommended ASA/Plavix X 3 weeks followed by ASA alone. TCD bubble study was negative for HITS and 30 day cardiac monitor recommended at discharge. Dr. Linus Salmons was recommended treating patient empirically for neurosyphillis as LP contraindicated on ASA/Plavix. He was treated with IV PCN for 10 days and bicillin 2.4 millions units weekly X 3--last dose given on 6/19 prior to discharge. He has made good gains during his rehab stay and is modified independent at discharge. He will continue to receive follow up HHPT, [place] and [place] by  [facility] after discharge.   Rehab course: During patient's stay in rehab weekly team conferences were held to monitor patient's progress, set goals and  discuss barriers to discharge. At admission, patient required mod assist with basic self care tasks and min assist with mobility.  He  has had improvement in activity tolerance, balance, postural control as well as ability to compensate for deficits.  He is able to complete ADL tasks at modified independent level.  He is able to ambulate 150' with RW and has had  improvement in control of BLE.  He is able to climb 12 stairs with supervision. Family education was completed regarding all aspects of care and  Mobility.    Disposition:  Home  Diet: Heart healthy/Carb modified.   Special Instructions: 1. Monitor BS ac/hs and take insulin as prescribed. 2.  Stop Plavix after 6/27. 3. HHRN to draw BMET on 06/24 with results to Dr. Naaman Plummer and Dr. [name].   Discharge Instructions    Ambulatory referral to Cardiology   Complete by:  As directed    Needs 30 day event monitor due to bicerebral strokes   Ambulatory referral to Endocrinology   Complete by:  As directed    Brittle diabetic--discharge date 6/19   Ambulatory referral to Neurology   Complete by:  As directed    Follow up with stroke clinic NP (Jessica Vanschaick or Cecille Rubin, if both not available, consider Zachery Dauer, or [facility]) at [facility] in about 4 weeks. Thanks.     Allergies as of 03/08/2018      Reactions   Orange    Increases blood sugar immediately      Medication List    STOP taking these medications   aspirin 81 MG tablet Replaced by:  aspirin 81 MG EC tablet   lisinopril 20 MG tablet Commonly known as:  PRINIVIL,ZESTRIL     TAKE these medications   acetaminophen 325 MG tablet Commonly known as:  TYLENOL Take 1-2 tablets (325-650 mg total) by mouth every 4 (four) hours as needed for mild pain.   amLODipine 10 MG tablet Commonly known as:  NORVASC Take 1 tablet (10 mg total) by mouth daily. What changed:    medication strength  how much to take   aspirin 81 MG EC tablet Take 1 tablet (81 mg total)  by mouth daily. Replaces:  aspirin 81 MG tablet   atorvastatin 80 MG tablet Commonly known as:  LIPITOR Take 1 tablet (80 mg total) by mouth daily at 6 PM. What changed:    medication strength  how much to take   clopidogrel 75 MG tablet Commonly known as:  PLAVIX Take 1 tablet (75 mg total) by mouth daily.   insulin aspart 100 UNIT/ML injection Commonly known as:  novoLOG Use 5 units with lunch and supper What changed:    how much to take  how to take this  when to take this  additional instructions   insulin detemir 100 UNIT/ML injection Commonly known as:  LEVEMIR Use 16 units with breakfast and 16 units with supper daily What changed:    how much to take  how to take this  when to take this  additional instructions   MUSCLE RUB 10-15 % Crea Apply 1 application topically as needed for muscle pain. To left shoulder      Follow-up Information    [facility]. Schedule an appointment as soon as possible for a visit in 4 week(s).  Specialty:  Radiology Contact information: [address] [place] [phone]       [name], MD. Daphane Shepherd on 03/14/2018.   Specialty:  Internal Medicine Why:  @ 9AM Contact information: [place] [address] [phone]        Meredith Staggers, MD Follow up.   Specialty:  Physical Medicine and Rehabilitation Why:  office will call you with follow up appointment Contact information: [address] [place] [address] [phone]        Elayne Snare, MD Follow up on 05/02/2018.   Specialty:  Endocrinology Why:  Be there at 10:30 am for appointment at 11 am  Contact information: [address] [place] [address] [phone]        Thayer Headings, MD. Call in 1 day(s).   Specialty:  Infectious Diseases Why:  for follow up appointment Contact information: [address] [address] [phone]           Signed: Bary Leriche 03/08/2018, 6:21 PM

## 2018-03-07 NOTE — Progress Notes (Signed)
[facility] PHYSICAL MEDICINE & REHABILITATION     PROGRESS NOTE    Subjective/Complaints: Up in shower. No new problems overnight  ROS: Patient denies fever, rash, sore throat, blurred vision, nausea, vomiting, diarrhea, cough, shortness of breath or chest pain, joint or back pain, headache, or mood change.    Objective:  No results found. Recent Labs    03/06/18 0525  WBC 9.6  HGB 10.8*  HCT 32.9*  PLT 282   Recent Labs    03/06/18 0525 03/07/18 0628  NA 139 138  K 4.1 4.4  CL 107 107  GLUCOSE 82 190*  BUN 37* 39*  CREATININE 2.04* 2.06*  CALCIUM 9.6 9.5   CBG (last 3)  Recent Labs    03/06/18 1638 03/06/18 2101 03/07/18 0658  GLUCAP 320* 124* 162*    Wt Readings from Last 3 Encounters:  02/22/18 72.3 kg (159 lb 4.8 oz)  02/17/18 72.6 kg (160 lb)  10/23/16 67.2 kg (148 lb 2.4 oz)     Intake/Output Summary (Last 24 hours) at 03/07/2018 0923 Last data filed at 03/07/2018 0700 Gross per 24 hour  Intake 240 ml  Output 800 ml  Net -560 ml    Vital Signs: Blood pressure 123/85, pulse 85, temperature 97.8 F (36.6 C), temperature source Oral, resp. rate 17, height 6\' 2"  (1.88 m), weight 72.3 kg (159 lb 4.8 oz), SpO2 100 %. Physical Exam:  Constitutional: No distress . Vital signs reviewed. HEENT: EOMI, oral membranes moist Neck: supple Cardiovascular: RRR without murmur. No JVD    Respiratory: CTA Bilaterally without wheezes or rales. Normal effort    GI: BS +, non-tender, non-distended  Musculoskeletal:  No edema or tenderness in extremities  Neurological: He is alert and oriented to person, place, and time.  Motor: Right upper extremity/right lower extremity: 4+/5 proximal to distal with decreased Tat Momoli. Good sitting balance  LUE and LLE: 5/5 proximal to distal. Right sided sensory loss  Skin: Skin is warm and dry.  Psychiatric: He has a normal mood and affect. His behavior is normal.     Assessment/Plan: 1. Right hemiparesis secondary to  bi-cerebral infarcts which require 3+ hours per day of interdisciplinary therapy in a comprehensive inpatient rehab setting. Physiatrist is providing close team supervision and 24 hour management of active medical problems listed below. Physiatrist and rehab team continue to assess barriers to discharge/monitor patient progress toward functional and medical goals.  Function:  Bathing Bathing position   Position: Shower  Bathing parts Body parts bathed by patient: Right arm, Right lower leg, Left arm, Chest, Left lower leg, Back, Abdomen, Buttocks, Right upper leg, Left upper leg, Front perineal area    Bathing assist Assist Level: More than reasonable time      Upper Body Dressing/Undressing Upper body dressing   What is the patient wearing?: Pull over shirt/dress     Pull over shirt/dress - Perfomed by patient: Thread/unthread right sleeve, Thread/unthread left sleeve, Put head through opening, Pull shirt over trunk Pull over shirt/dress - Perfomed by helper: Pull shirt over trunk        Upper body assist Assist Level: No help, No cues      Lower Body Dressing/Undressing Lower body dressing   What is the patient wearing?: Underwear, Pants, Socks, Shoes Underwear - Performed by patient: Thread/unthread right underwear leg, Thread/unthread left underwear leg, Pull underwear up/down   Pants- Performed by patient: Thread/unthread right pants leg, Thread/unthread left pants leg, Pull pants up/down   Non-skid slipper socks- Performed  by patient: Don/doff right sock, Don/doff left sock   Socks - Performed by patient: Don/doff right sock, Don/doff left sock Socks - Performed by helper: Don/doff right sock, Don/doff left sock Shoes - Performed by patient: Don/doff right shoe, Don/doff left shoe, Fasten right, Fasten left Shoes - Performed by helper: Fasten right, Fasten left          Lower body assist Assist for lower body dressing: Assistive device      Toileting Toileting  Toileting activity did not occur: No continent bowel/bladder event Toileting steps completed by patient: Adjust clothing prior to toileting, Performs perineal hygiene, Adjust clothing after toileting Toileting steps completed by helper: Performs perineal hygiene, Adjust clothing after toileting Toileting Assistive Devices: Grab bar or rail  Toileting assist Assist level: No help/no cues   Transfers Chair/bed transfer   Chair/bed transfer method: Stand pivot Chair/bed transfer assist level: Supervision or verbal cues Chair/bed transfer assistive device: Armrests, Medical sales representative     Max distance: 100 ft  Assist level: Supervision or verbal cues   Wheelchair   Type: Manual Max wheelchair distance: 75 Assist Level: No help, No cues, assistive device, takes more than reasonable amount of time  Cognition Comprehension Comprehension assist level: Understands complex 90% of the time/cues 10% of the time  Expression Expression assist level: Expresses complex 90% of the time/cues < 10% of the time  Social Interaction Social Interaction assist level: Interacts appropriately 90% of the time - Needs monitoring or encouragement for participation or interaction.  Problem Solving Problem solving assist level: Solves complex problems: With extra time  Memory Memory assist level: Recognizes or recalls 90% of the time/requires cueing < 10% of the time  Medical Problem List and Plan: 1.  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   -continue therapies  -appreciate neurology work up and recs--follow up in 4 weeks from admit   -DAPT for 3 weeks then ASA alone   -30 day cardiac monitoring recommended as outpt 2.  DVT Prophylaxis/Anticoagulation: Pharmaceutical: Lovenox 3. Pain Management: N/A 4. Mood: LCSW to follow for evaluation and support.  5. Neuropsych: This patient is capable of making decisions on his own behalf. 6. Skin/Wound Care: routine  pressure relief measures.  7. Fluids/Electrolytes/Nutrition: Monitor I/O.   8. T1DM with DKA:  Multiple episodes of DKA with Hgb A1C- 10.    -  mealtime covg 4u, TID with meals  - levemir: increased to 16 u bid  With some improvement  - Dr. Dwyane Dee who saw this patient before is willing to see him again as an outpt.     -outpt referral   9. HTN: continue norvasc to 10mg      -  CKD  l  -bun/cr trending up. (sl increase/stable today)  -ACE stopped  -follow up again tomorrow  -will need outpt follow up of kidney function  10. Dyslipidemia: On lipitor 11. Leucocytosis: afebrile, wbc'x 7.7 12. Acute on chronic renal failure?: Encourage fluid intake.  See above, Abx related? 13. Positive RPR/T Pallidum: Appreciate ID  help  -given risks of LP ID recs treatment   -   IV Pen G q4 for 10 days total (6/6)-- now complete  -Weekly Bicillin IM x3 doses.  He is due again   6/20 (will give him a dose on 6/19 the day of discharge)  LOS (Days) [place] EVALUATION WAS PERFORMED     Meredith Staggers, MD 03/07/2018 9:23 AM

## 2018-03-07 NOTE — Progress Notes (Signed)
Physical Therapy Session Note  Patient Details  Name: Nathaniel [name] MRN: [id number] Date of Birth: 10-Jun-1986  Today's Date: 03/07/2018 PT Individual Time: 1400-1445 PT Individual Time Calculation (min): 45 min  and Today's Date: 03/07/2018 PT Missed Time: 30 Minutes Missed Time Reason: Patient unwilling to participate;Pain  Short Term Goals: Week 2:  PT Short Term Goal 1 (Week 2): =LTG due to ELOS  Skilled Therapeutic Interventions/Progress Updates:   Pt in supine upon arrival and c/o stomach pain. Requesting to hold therapy until he got some medicine and it could take effect. RN present providing pain medication. Pt missed 30 min of skilled PT 2/2 stomach pain. Returned to room 30 min later and pt gathering his clothes to take to bathroom to change and shower. Assisted w/ opening bathroom door for him and verbal cues for technique to carry clothes while using RW for gait. Pt toileted and took shower at modified independent level w/ use of shower chair and RW for gait in and out of bathroom. Pt demonstrating appropriate safety awareness during self-care tasks, requiring no verbal cues from therapist for safety. Ended session sitting EOB w/ bed alarm engaged, all needs met.   Therapy Documentation Precautions:  Precautions Precautions: Fall Precaution Comments: RLE ataxia and hemiparesis Restrictions Weight Bearing Restrictions: No  See Function Navigator for Current Functional Status.   Therapy/Group: Individual Therapy  Shonika Kolasinski K Arnette 03/07/2018, 2:45 PM

## 2018-03-08 ENCOUNTER — Other Ambulatory Visit: Payer: Self-pay | Admitting: *Deleted

## 2018-03-08 DIAGNOSIS — I63512 Cerebral infarction due to unspecified occlusion or stenosis of left middle cerebral artery: Secondary | ICD-10-CM

## 2018-03-08 LAB — BASIC METABOLIC PANEL
ANION GAP: 7 (ref 5–15)
BUN: 40 mg/dL — ABNORMAL HIGH (ref 6–20)
CO2: 22 mmol/L (ref 22–32)
Calcium: 9 mg/dL (ref 8.9–10.3)
Chloride: 108 mmol/L (ref 101–111)
Creatinine, Ser: 1.99 mg/dL — ABNORMAL HIGH (ref 0.61–1.24)
GFR calc Af Amer: 50 mL/min — ABNORMAL LOW (ref >60)
GFR, EST NON AFRICAN AMERICAN: 43 mL/min — AB (ref >60)
GLUCOSE: 278 mg/dL — AB (ref 65–99)
POTASSIUM: 4.2 mmol/L (ref 3.5–5.1)
Sodium: 137 mmol/L (ref 135–145)

## 2018-03-08 LAB — GLUCOSE, CAPILLARY
GLUCOSE-CAPILLARY: 259 mg/dL — AB (ref 65–99)
Glucose-Capillary: 274 mg/dL — ABNORMAL HIGH (ref 65–99)

## 2018-03-08 MED ORDER — INSULIN DETEMIR 100 UNIT/ML ~~LOC~~ SOLN: SUBCUTANEOUS | 11 refills | Status: DC

## 2018-03-08 MED ORDER — INSULIN ASPART 100 UNIT/ML ~~LOC~~ SOLN: SUBCUTANEOUS | 11 refills | Status: DC

## 2018-03-08 NOTE — Progress Notes (Signed)
[facility]IC[facility] & REHABILITATION     PROGRESS NOTE    Subjective/Complaints: Complains of left shoulder pain. Both sore today. Able to work through  ROS: Patient denies fever, rash, sore throat, blurred vision, nausea, vomiting, diarrhea, cough, shortness of breath or chest pain, joint or back pain, headache, or mood change.   Objective:  No results found. Recent Labs    03/06/18 0525  WBC 9.6  HGB 10.8*  HCT 32.9*  PLT 282   Recent Labs    03/07/18 0628 03/08/18 0602  NA 138 137  K 4.4 4.2  CL 107 108  GLUCOSE 190* 278*  BUN 39* 40*  CREATININE 2.06* 1.99*  CALCIUM 9.5 9.0   CBG (last 3)  Recent Labs    03/07/18 1625 03/07/18 2113 03/08/18 0636  GLUCAP 390* 381* 274*    Wt Readings from Last 3 Encounters:  03/08/18 72.8 kg (160 lb 7.9 oz)  02/17/18 72.6 kg (160 lb)  10/23/16 67.2 kg (148 lb 2.4 oz)     Intake/Output Summary (Last 24 hours) at 03/08/2018 0916 Last data filed at 03/08/2018 0900 Gross per 24 hour  Intake 702 ml  Output 1950 ml  Net -1248 ml    Vital Signs: Blood pressure 114/71, pulse 93, temperature 98 F (36.7 C), temperature source Oral, resp. rate 18, height 6\' 2"  (1.88 m), weight 72.8 kg (160 lb 7.9 oz), SpO2 100 %. Physical Exam:  Constitutional: No distress . Vital signs reviewed. HEENT: EOMI, oral membranes moist Neck: supple Cardiovascular: RRR without murmur. No JVD    Respiratory: CTA Bilaterally without wheezes or rales. Normal effort    GI: BS +, non-tender, non-distended  Musculoskeletal:  B/L shoulder pain with IR/ER  Neurological: He is alert and oriented to person, place, and time.  Motor: Right upper extremity/right lower extremity: 4+/5 proximal to distal with decreased [place]. Good sitting balance  LUE and LLE: 5/5 proximal to distal. Right sided sensory loss  Skin: Skin is warm and dry.  Psychiatric: He has a normal mood and affect. His behavior is normal.     Assessment/Plan: 1. 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 Physiatrist is providing close team supervision and 24 hour management of active medical problems listed below. Physiatrist and rehab team continue to assess barriers to discharge/monitor patient progress toward functional and medical goals.  Function:  Bathing Bathing position   Position: Shower  Bathing parts Body parts bathed by patient: Right arm, Right lower leg, Left arm, Chest, Left lower leg, Back, Abdomen, Buttocks, Right upper leg, Left upper leg, Front perineal area    Bathing assist Assist Level: More than reasonable time      Upper Body Dressing/Undressing Upper body dressing   What is the patient wearing?: Pull over shirt/dress     Pull over shirt/dress - Perfomed by patient: Thread/unthread right sleeve, Thread/unthread left sleeve, Put head through opening, Pull shirt over trunk Pull over shirt/dress - Perfomed by helper: Pull shirt over trunk        Upper body assist Assist Level: No help, No cues      Lower Body Dressing/Undressing Lower body dressing   What is the patient wearing?: Underwear, Pants, Socks, Shoes Underwear - Performed by patient: Thread/unthread right underwear leg, Thread/unthread left underwear leg, Pull underwear up/down   Pants- Performed by patient: Thread/unthread right pants leg, Thread/unthread left pants leg, Pull pants up/down   Non-skid  slipper socks- Performed by patient: Don/doff right sock, Don/doff left sock   Socks - Performed by patient: Don/doff right sock, Don/doff left sock Socks - Performed by helper: Don/doff right sock, Don/doff left sock Shoes - Performed by patient: Don/doff right shoe, Don/doff left shoe, Fasten right, Fasten left Shoes - Performed by helper: Fasten right, Fasten left          Lower body assist Assist for lower body dressing: Assistive device       Toileting Toileting Toileting activity did not occur: No continent bowel/bladder event Toileting steps completed by patient: Adjust clothing prior to toileting Toileting steps completed by helper: Performs perineal hygiene, Adjust clothing after toileting Toileting Assistive Devices: Grab bar or rail  Toileting assist Assist level: No help/no cues   Transfers Chair/bed transfer   Chair/bed transfer method: Stand pivot Chair/bed transfer assist level: No Help, no cues, assistive device, takes more than a reasonable amount of time Chair/bed transfer assistive device: Armrests, Medical sales representative     Max distance: 150 Assist level: No help, No cues, assistive device, takes more than a reasonable amount of time   Wheelchair   Type: Manual Max wheelchair distance: 150 Assist Level: No help, No cues, assistive device, takes more than reasonable amount of time  Cognition Comprehension Comprehension assist level: Understands complex 90% of the time/cues 10% of the time  Expression Expression assist level: Expresses complex 90% of the time/cues < 10% of the time  Social Interaction Social Interaction assist level: Interacts appropriately 90% of the time - Needs monitoring or encouragement for participation or interaction.  Problem Solving Problem solving assist level: Solves complex problems: With extra time  Memory Memory assist level: Recognizes or recalls 90% of the time/requires cueing < 10% of the time  Medical Problem List and Plan: 1.  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   -continue therapies  -appreciate neurology work up and recs--follow up in 4 weeks from admit   -DAPT for 3 weeks then ASA alone   -30 day cardiac monitoring recommended as outpt  -Patient to see Rehab MD/provider in the office for transitional care encounter in 1-2 weeks.  2.  DVT Prophylaxis/Anticoagulation: Pharmaceutical: Lovenox 3. Pain Management: N/A 4.  Mood: LCSW to follow for evaluation and support.  5. Neuropsych: This patient is capable of making decisions on his own behalf. 6. Skin/Wound Care: routine pressure relief measures.  7. Fluids/Electrolytes/Nutrition: Monitor I/O.   8. T1DM with DKA:  Multiple episodes of DKA with Hgb A1C- 10.    -  mealtime covg 4u, TID with meals  - levemir: increased to 16 u bid  With some improvement  - Dr. Dwyane Dee who saw this patient before is willing to see him again as an outpt.     -outpt referral made  9. HTN: continue norvasc to 10mg      -  CKD  l  -bun 40/1.99  -ACE stopped  -will need outpt follow up of kidney function--RECHECK MONDAY 10. Dyslipidemia: On lipitor 11. Leucocytosis: afebrile, wbc'x 7.7 12. Acute on chronic renal failurE: Encourage fluid intake.  May be Abx related?  -outpt lab work  - 81. Positive RPR/T Pallidum: Appreciate ID  help  -given risks of LP ID recs treatment   -   IV Pen G q4 for 10 days total (6/6)-- now complete  -Weekly Bicillin IM x3 doses.  He is due again   6/20 (will give him last dose  today)  -needs outpt ID follow up   LOS (Days) [place] EVALUATION WAS PERFORMED     Meredith Staggers, MD 03/08/2018 9:16 AM

## 2018-03-08 NOTE — Progress Notes (Signed)
Social Work Patient ID: Nathaniel [name], male   DOB: Apr 02, 1986, 32 y.o.   MRN: [id number]  Discussion in team conference revealed that pt is on track for d/c after meeting mod I goals.  CSW arranged HH and DME, as well as f/u appt with PCP.  CSW remains available as needed.

## 2018-03-08 NOTE — Progress Notes (Signed)
Pt discharged from unit via wheelchair by NT. Belongings and discharge instructions in tow. Discharge instructions reviewed by PA and pt verbalizes understanding. Claude Manges, LPN

## 2018-03-08 NOTE — Progress Notes (Signed)
Social Work Discharge Note  The overall goal for the admission was met for:   Discharge location: Yes - home  Length of Stay: Yes - 14 days  Discharge activity level: Yes - modified independent  Home/community participation: Yes  Services provided included: MD, RD, PT, OT, RN, Pharmacy, [place]: Private Insurance: [employer] Medicare  Follow-up services arranged: Home Health: PT/OT/RN from [facility], DME: 16"x18" wheelchair; rolling walker; tub transfer bench from [facility] and Patient/Family has no preference for HH/DME agencies  Comments (or additional information):  Patient/Family verbalized understanding of follow-up arrangements: Yes  Individual responsible for coordination of the follow-up plan: pt and his mother  Confirmed correct DME delivered: Trey Sailors 03/08/2018    Taniah Reinecke, Silvestre Mesi

## 2018-03-08 NOTE — Discharge Instructions (Signed)
Inpatient Rehab Discharge Instructions  Nathaniel [name] Discharge date and time: 03/08/18   Activities/Precautions/ Functional Status: Activity: no lifting, driving, or strenuous exercise  till cleared by MD Diet: cardiac diet and diabetic diet Wound Care: none needed   Functional status:  ___ No restrictions     ___ Walk up steps independently ___ 24/7 supervision/assistance   ___ Walk up steps with assistance _X__ Intermittent supervision/assistance  ___ Bathe/dress independently _X__ Walk with walker     ___ Bathe/dress with assistance ___ Walk Independently    ___ Shower independently ___ Walk with assistance    ___ Shower with assistance _X__ No alcohol     ___ Return to work/school ________   Special Instructions: 1. Monitor blood sugars at least before meals and at bedtime. Always check your blood sugar if you feel there is something wrong  2. Your blood sugars are getting better but insulin may need to be adjusted depending on your home diet and activity. Be sure to take your blood sugar record with you on follow up appointment with Dr. [name] next week.    COMMUNITY REFERRALS UPON DISCHARGE:   Home Health:   PT     OT     RN     Agency:  [facility] Phone:  [phone] Medical Equipment/Items Ordered:  [phone] wheelchair with cushion; rolling walker; tub transfer bench  Agency/Supplier:  [facility]         Phone:  [phone]   GENERAL COMMUNITY RESOURCES FOR PATIENT/FAMILY: Support Groups:  [facility] Support [facility] (for survivors, family members, and caregivers)                              Meets the third Monday of each month from 1:30pm - 2:30pm                              [place], [address], [place]                              For information, call Ihor Austin at [phone] or Mauricio Po at [phone]    STROKE/TIA DISCHARGE INSTRUCTIONS SMOKING Cigarette smoking nearly  doubles your risk of having a stroke & is the single most alterable risk factor  If you smoke or have smoked in the last 12 months, you are advised to quit smoking for your health.  Most of the excess cardiovascular risk related to smoking disappears within a year of stopping.  Ask you doctor about anti-smoking medications  [facility] Quit Line: 1-800-QUIT NOW  Free Smoking Cessation Classes [phone]  CHOLESTEROL Know your levels; limit fat & cholesterol in your diet  Lipid Panel     Component Value Date/Time   CHOL 260 (H) 02/19/2018 0559   TRIG 93 02/19/2018 0559   HDL 40 (L) 02/19/2018 0559   CHOLHDL 6.5 02/19/2018 0559   VLDL 19 02/19/2018 0559   LDLCALC 201 (H) 02/19/2018 0559      Many patients benefit from treatment even if their cholesterol is at goal.  Goal: Total Cholesterol (CHOL) less than 160  Goal:  Triglycerides (TRIG) less than 150  Goal:  HDL greater than 40  Goal:  LDL (LDLCALC) less than 100   BLOOD PRESSURE American Stroke Association blood pressure target is  less that 120/80 mm/Hg  Your discharge blood pressure is:  BP: 114/71  Monitor your blood pressure  Limit your salt and alcohol intake  Many individuals will require more than one medication for high blood pressure  DIABETES (A1c is a blood sugar average for last 3 months) Goal HGBA1c is under 7% (HBGA1c is blood sugar average for last 3 months)  Diabetes:     Lab Results  Component Value Date   HGBA1C 10.0 (H) 02/19/2018     Your HGBA1c can be lowered with medications, healthy diet, and exercise.  Check your blood sugar as directed by your physician  Call your physician if you experience unexplained or low blood sugars.  PHYSICAL ACTIVITY/REHABILITATION Goal is 30 minutes at least 4 days per week  Activity: No driving, Therapies: see above Return to work: N/A  Activity decreases your risk of heart attack and stroke and makes your heart stronger.  It helps control your weight and blood  pressure; helps you relax and can improve your mood.  Participate in a regular exercise program.  Talk with your doctor about the best form of exercise for you (dancing, walking, swimming, cycling).  DIET/WEIGHT Goal is to maintain a healthy weight  Your discharge diet is:  Diet Order           Diet heart healthy/carb modified Room service appropriate? Yes; Fluid consistency: Thin  Diet effective now         liquids Your height is:  Height: 6\' 2"  (188 cm) Your current weight is: Weight: 72.8 kg (160 lb 7.9 oz) Your Body Mass Index (BMI) is:  BMI (Calculated): 20.6  Following the type of diet specifically designed for you will help prevent another stroke.  You are at goal weight   Your goal Body Mass Index (BMI) is 19-24.  Healthy food habits can help reduce 3 risk factors for stroke:  High cholesterol, hypertension, and excess weight.  RESOURCES Stroke/Support Group:  Call [phone]   STROKE EDUCATION PROVIDED/REVIEWED AND GIVEN TO PATIENT Stroke warning signs and symptoms How to activate emergency medical system (call 911). Medications prescribed at discharge. Need for follow-up after discharge. Personal risk factors for stroke. Pneumonia vaccine given:  Flu vaccine given:  My questions have been answered, the writing is legible, and I understand these instructions.  I will adhere to these goals & educational materials that have been provided to me after my discharge from the hospital.     My questions have been answered and I understand these instructions. I will adhere to these goals and the provided educational materials after my discharge from the hospital.  Patient/Caregiver Signature _______________________________ Date __________  Clinician Signature _______________________________________ Date __________  Please bring this form and your medication list with you to all your follow-up doctor's appointments.

## 2018-03-08 NOTE — Patient Care Conference (Signed)
Inpatient RehabilitationTeam Conference and Plan of Care Update Date: 03/07/2018   Time: 2:00 PM    Patient Name: Nathaniel [name]      Medical Record Number: [id number]  Date of Birth: May 22, 1986 Sex: Male         Room/Bed: 4W09C/4W09C-01 Payor Info: Payor: AET[employer] MEDICARE / Plan: AET[employer] MEDICARE HMO/PPO / Product Type: *No Product type* /    Admitting Diagnosis: multiple Infarcts  Admit Date/Time:  02/22/2018  2:07 PM Admission Comments: No comment available   Primary Diagnosis:  <principal problem not specified> Principal Problem: <principal problem not specified>  Patient Active Problem List   Diagnosis Date Noted  . Stage 3 chronic kidney disease ([place])   . Hypoglycemia   . Neurosyphilis   . Mixed hyperlipidemia   . Acute ischemic left MCA stroke ([place]) 02/22/2018  . Abnormality of gait following cerebrovascular accident (CVA)   . Type 1 diabetes mellitus with peripheral circulatory complications ([place])   . Essential hypertension   . Dyslipidemia   . Syphilis   . DKA (diabetic ketoacidoses) ([place]) 02/16/2018  . CVA (cerebral vascular accident) ([place]) 02/16/2018  . Acute renal failure (ARF) ([place]) 10/21/2016  . Diabetic ketoacidosis without coma associated with type 1 diabetes mellitus ([place])   . Hyperbilirubinemia   . Hyponatremia 04/15/2016  . DM (diabetes mellitus) type 1, uncontrolled, with ketoacidosis ([place]) 04/15/2016  . Hyperglycemia 04/15/2016  . AKI (acute kidney injury) ([place]) 01/04/2016  . Malignant hypertension 01/04/2016  . Noncompliance with medications 01/04/2016  . Intractable nausea and vomiting 04/01/2015  . Gastroparesis 04/01/2015  . Type 1 diabetes mellitus with complication ([place]) 29/47/6546  . Chronic hypertension   . Nausea with vomiting   . Abscess, gluteal, right 06/21/2014  . Nausea and vomiting 04/08/2013  . Hyperglycemia without ketosis 04/08/2013  . Essential hypertension, benign 04/08/2013  . Hypercalcemia 07/29/2011  . Vomiting 07/28/2011  .  Leukocytosis 07/28/2011  . Hypokalemia 07/28/2011  . DKA, type 1 ([place]) 07/27/2011  . Dehydration 07/27/2011  . Smoker 07/27/2011  . Marijuana abuse 07/27/2011    Expected Discharge Date: Expected Discharge Date: 03/08/18  Team Members Present: Physician leading conference: Dr. Alger Simons Social Worker Present: Lennart Pall, LCSW Nurse Present: Dorien Chihuahua, RN PT Present: Dwyane Dee, PT OT Present: Roanna Epley, [place], OT PPS Coordinator present : Daiva Nakayama, RN, CRRN     Current Status/Progress Goal Weekly Team Focus  Medical   sugars showing some improvement but remain difficult to control. worsening renal function  see prior  ongoing DM mgt, follow labs, med adjustments   Bowel/Bladder   continent of b/b. LBM 6/16  remain continent of b/b with min assist  monitor b/b; laxative prn, toilet Q2h with min assist   Swallow/Nutrition/ Hydration             ADL's   supervision overall  bathing-independenct; dressing I with AE/AD; toileting-I with AD; shower transfer-supervision; homemaking tasks-supervision  RUE NMR, educaiton, BADL retraining   Mobility   close supervision on stairs, mod I transfers and gait with RW  mod I with RW, supervision in community  d/c Wednesday   Communication             Safety/Cognition/ Behavioral Observations            Pain   denies pain/discomfort  pain <=2/10  assess pain qshift and prn   Skin   skin intact  skin will remain free of breakdown/infection  assess skin qshift and PRN    Rehab  Goals Patient on target to meet rehab goals: Yes Rehab Goals Revised: none *See Care Plan and progress notes for long and short-term goals.     Barriers to Discharge  Current Status/Progress Possible Resolutions Date Resolved   Physician    Medical stability        medical mgt as in PMR progress notes      Nursing                  PT                    OT                  SLP                SW                 Discharge Planning/Teaching Needs:  Plan to d/c home with mother who can provide 24/7 support.  Pt's care partner is able to provide level of care needed and pt can direct his care.   Team Discussion:  Pt having some kidney issues and will need these to be followed closely as an outpt.  Pt's DM also being monitored here.  Pt with stomachache on day of conference.  Pt has met therapy goals and is ready for d/c.  Revisions to Treatment Plan:  none    Continued Need for Acute Rehabilitation Level of Care: The patient requires daily medical management by a physician with specialized training in physical medicine and rehabilitation for the following conditions: Daily direction of a multidisciplinary physical rehabilitation program to ensure safe treatment while eliciting the highest outcome that is of practical value to the patient.: Yes Daily medical management of patient stability for increased activity during participation in an intensive rehabilitation regime.: Yes Daily analysis of laboratory values and/or radiology reports with any subsequent need for medication adjustment of medical intervention for : Post surgical problems;Wound care problems  Danila Eddie, Silvestre Mesi 03/08/2018, 9:21 AM

## 2018-03-09 ENCOUNTER — Telehealth: Payer: Self-pay | Admitting: Registered Nurse

## 2018-03-09 ENCOUNTER — Telehealth: Payer: Self-pay

## 2018-03-09 NOTE — Telephone Encounter (Signed)
Event monitor ordered per request

## 2018-03-09 NOTE — Telephone Encounter (Signed)
Transitional Care call Transitional Care Call Questions answered by Mother : Ms. Deetta Perla  Patient name: Nathaniel [name]  DOB: 1986/04/30 1. Are you/is patient experiencing any problems since coming home? No a. Are there any questions regarding any aspect of care? No 2. Are there any questions regarding medications administration/dosing? No a. Are meds being taken as prescribed? Yes, Instructed to Discontinued Plavix after 03/16/2018 b. "Patient should review meds with caller to confirm" Medication List Reviewed 3. Have there been any falls? No 4. Has Home Health been to the house and/or have they contacted you? Yes, [facility]: BMP will be drawn on 03/13/2018 a. If not, have you tried to contact them? NA b. Can we help you contact them? NA 5. Are bowels and bladder emptying properly? Yes a. Are there any unexpected incontinence issues? No b. If applicable, is patient following bowel/bladder programs? NA 6. Any fevers, problems with breathing, unexpected pain? No 7. Are there any skin problems or new areas of breakdown? No 8. Has the patient/family member arranged specialty MD follow up (ie cardiology/neurology/renal/surgical/etc.)?  Ms. Wynetta Emery will schedule follow up appointments she states.  a. Can we help arrange? NA 9. Does the patient need any other services or support that we can help arrange? NO 10. Are caregivers following through as expected in assisting the patient? Yes 11. Has the patient quit smoking, drinking alcohol, or using drugs as recommended? Ms. Wynetta Emery states [name] doesn't smoke,   Appointment date/time 03/15/2018, arrival time 2:00 for 2:20 appointment with Dr. Naaman Plummer. At [facility]

## 2018-03-11 ENCOUNTER — Ambulatory Visit (INDEPENDENT_AMBULATORY_CARE_PROVIDER_SITE_OTHER): Payer: Medicare HMO

## 2018-03-11 DIAGNOSIS — I63512 Cerebral infarction due to unspecified occlusion or stenosis of left middle cerebral artery: Secondary | ICD-10-CM | POA: Diagnosis not present

## 2018-03-15 ENCOUNTER — Encounter: Payer: Medicare HMO | Attending: Physical Medicine & Rehabilitation | Admitting: Physical Medicine & Rehabilitation

## 2018-03-15 ENCOUNTER — Encounter: Payer: Self-pay | Admitting: Physical Medicine & Rehabilitation

## 2018-03-15 VITALS — BP 117/74 | HR 98 | Ht 74.0 in | Wt 158.0 lb

## 2018-03-15 DIAGNOSIS — Z9114 Patient's other noncompliance with medication regimen: Secondary | ICD-10-CM | POA: Diagnosis not present

## 2018-03-15 DIAGNOSIS — Z800 Family history of malignant neoplasm of digestive organs: Secondary | ICD-10-CM | POA: Diagnosis not present

## 2018-03-15 DIAGNOSIS — Z794 Long term (current) use of insulin: Secondary | ICD-10-CM | POA: Diagnosis not present

## 2018-03-15 DIAGNOSIS — E1051 Type 1 diabetes mellitus with diabetic peripheral angiopathy without gangrene: Secondary | ICD-10-CM | POA: Diagnosis not present

## 2018-03-15 DIAGNOSIS — R531 Weakness: Secondary | ICD-10-CM | POA: Insufficient documentation

## 2018-03-15 DIAGNOSIS — Z841 Family history of disorders of kidney and ureter: Secondary | ICD-10-CM | POA: Diagnosis not present

## 2018-03-15 DIAGNOSIS — A523 Neurosyphilis, unspecified: Secondary | ICD-10-CM | POA: Diagnosis not present

## 2018-03-15 DIAGNOSIS — I129 Hypertensive chronic kidney disease with stage 1 through stage 4 chronic kidney disease, or unspecified chronic kidney disease: Secondary | ICD-10-CM | POA: Insufficient documentation

## 2018-03-15 DIAGNOSIS — N181 Chronic kidney disease, stage 1: Secondary | ICD-10-CM | POA: Insufficient documentation

## 2018-03-15 DIAGNOSIS — Z833 Family history of diabetes mellitus: Secondary | ICD-10-CM | POA: Diagnosis not present

## 2018-03-15 DIAGNOSIS — Z09 Encounter for follow-up examination after completed treatment for conditions other than malignant neoplasm: Secondary | ICD-10-CM | POA: Diagnosis not present

## 2018-03-15 DIAGNOSIS — E1010 Type 1 diabetes mellitus with ketoacidosis without coma: Secondary | ICD-10-CM | POA: Insufficient documentation

## 2018-03-15 DIAGNOSIS — I63512 Cerebral infarction due to unspecified occlusion or stenosis of left middle cerebral artery: Secondary | ICD-10-CM | POA: Insufficient documentation

## 2018-03-15 DIAGNOSIS — Z8249 Family history of ischemic heart disease and other diseases of the circulatory system: Secondary | ICD-10-CM | POA: Insufficient documentation

## 2018-03-15 DIAGNOSIS — F17210 Nicotine dependence, cigarettes, uncomplicated: Secondary | ICD-10-CM | POA: Insufficient documentation

## 2018-03-15 MED ORDER — ASPIRIN EC 325 MG PO TBEC: 325.0000 mg | DELAYED_RELEASE_TABLET | Freq: Every day | ORAL | 0 refills | Status: DC

## 2018-03-15 NOTE — Patient Instructions (Signed)
1. STOP SMOKING!  2. NEED TO KEEP WRITTEN TRACK OF YOUR SUGARS AND THINGS, FOODS WHICH AFFECT THE READINGS UP OR DOWN  3. NEED TO CALL NEUROLOGY FOR APPT  4. I NEED TO SEE YOUR LABWORK   PLEASE FEEL FREE TO CALL OUR OFFICE WITH ANY PROBLEMS OR QUESTIONS ([phone])

## 2018-03-15 NOTE — Progress Notes (Signed)
Subjective:    Patient ID: Nathaniel [name], male    DOB: [birth year], 32 y.o.   MRN: [id number]  HPI  Mr. 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  He was discharged from inpatient rehab on June 19.  He has been home for about a week.  Mother resents with him in the office today.  He has been working with home health therapy on gait and balance.  His parent primarily been walking at home on his own and not using his wheelchair.  He notes improved strength and function in his right leg.  He still has some difficulties initiating movements at times but to much lesser extent.  Sensation has improved in his right leg but still is an issue in the right arm with some tingling noted.  Bowel bladder function have been within normal limits.  He has continued to smoke against advice.  He states his sugars still remain labile and can range from 50-300 within hours.  He is taking NovoLog 3 times daily with meals as well as his Levemir twice daily.  He is not keeping a record written log of the sugars.  Mood has been good.  He remains very upbeat.  Sleep is stable.  He denies substantial pain at this point.  Pain Inventory Average Pain 3 Pain Right Now 0 My pain is intermittent, dull and aching  In the last 24 hours, has pain interfered with the following? General activity 5 Relation with others 5 Enjoyment of life 5 What TIME of day is your pain at its worst? morning Sleep (in general) Fair  Pain is worse with: some activites Pain improves with: rest, heat/ice, therapy/exercise, pacing activities and medication Relief from Meds: 10  Mobility walk with assistance use a walker ability to climb steps?  yes use a wheelchair needs help with transfers  Function not employed: date last employed .  Neuro/Psych numbness tingling trouble walking  Prior Studies Any changes since last visit?  no  Physicians involved in your care Any  changes since last visit?  no   Family History  Problem Relation Age of Onset  . Diabetes Father   . Kidney failure Father        last 4 mnths of life  . Hypertension Mother   . Diabetes Maternal Grandmother   . Pancreatic cancer Maternal Grandfather   . Diabetes Paternal Grandfather    Social History   Socioeconomic History  . Marital status: Single    Spouse name: Not on file  . Number of children: Not on file  . Years of education: Not on file  . Highest education level: Not on file  Occupational History  . Occupation: disabled  Social Needs  . Financial resource strain: Not on file  . Food insecurity:    Worry: Not on file    Inability: Not on file  . Transportation needs:    Medical: Not on file    Non-medical: Not on file  Tobacco Use  . Smoking status: Current Every Day Smoker    Packs/day: 0.25    Years: 8.00    Pack years: 2.00    Types: Cigarettes  . Smokeless tobacco: Never Used  Substance and Sexual Activity  . Alcohol use: No  . Drug use: Yes    Types: Marijuana    Comment: last use yesterday  . Sexual activity: Yes    Birth control/protection: Condom  Lifestyle  . Physical activity:  Days per week: Not on file    Minutes per session: Not on file  . Stress: Not on file  Relationships  . Social connections:    Talks on phone: Not on file    Gets together: Not on file    Attends religious service: Not on file    Active member of club or organization: Not on file    Attends meetings of clubs or organizations: Not on file    Relationship status: Not on file  Other Topics Concern  . Not on file  Social History Narrative  . Not on file   Past Surgical History:  Procedure Laterality Date  . EYE SURGERY     Past Medical History:  Diagnosis Date  . Diabetes mellitus    lantus/novolog  . Hyperlipidemia   . Hypertension   . Marijuana abuse    occaisionally  . Noncompliance with medication regimen   . Tobacco abuse    5/day   Ht 6\' 2"   (1.88 m)   Wt 158 lb (71.7 kg)   BMI 20.29 kg/m   Opioid Risk Score:   Fall Risk Score:  `1  Depression screen PHQ 2/9  No flowsheet data found. 2444  Review of Systems  Constitutional: Negative.   HENT: Negative.   Eyes: Negative.   Respiratory: Negative.   Cardiovascular: Negative.   Gastrointestinal: Negative.   Endocrine: Negative.   Genitourinary: Negative.   Musculoskeletal: Positive for back pain.  Skin: Negative.   Allergic/Immunologic: Negative.   Neurological: Positive for numbness.  Hematological: Negative.   Psychiatric/Behavioral: Negative.   All other systems reviewed and are negative.      Objective:   Physical Exam  General: No acute distress.  Smells of cigarette smoke HEENT: EOMI, oral membranes moist Cards: reg rate  Chest: normal effort Abdomen: Soft, NT, ND Skin: dry, intact Extremities: no edema Neurological: He is alert and oriented to person, place, and time.  Motor: right sensory loss Upper and lower 1/2. Motor 3-4/5 RUE and RLE.  Nonfocal cranial nerve exam Skin: Skin is warm and dry.  Psychiatric: He has a normal mood and affect. His behavior is normal.          Assessment & Plan:  1. Right sided weakness and sensory deficits secondary to bilateral CVA.                 -continue HH therapies.                          -DAPT for 3 weeks then ASA alone--convert today to ECASA 325mg              -30 day cardiac monitoring recommended as outpt per neurology  -Discussed smoking cessation 2.  T1DM with DKA: Multiple episodes of DKA with Hgb A1C- 10.               -  mealtime covg 4u, TID with meals             - levemir: increased to 16 u bid                - Dr. Dwyane Dee  Follow up in August  -discussed importance of diet regulation  -need to keep a written log of sugars and meals 9. HTN: continue norvasc to 10mg                -  CKD  l             -  bun/cr have been trending up prior to discharge             -need recent  labwork from home health 10. Positive RPR/T Pallidum:               -s/p IV pen and bicillin per ID  -FOLLOW up per primary  Thirty minutes of face to face patient care time were spent during this visit. All questions were encouraged and answered. Follow up in 6 weeks.

## 2018-04-06 ENCOUNTER — Telehealth: Payer: Self-pay

## 2018-04-06 NOTE — Telephone Encounter (Signed)
-----   Message from Herminio Commons, MD sent at 04/06/2018  3:48 PM EDT ----- No abnormal heart rhythms.

## 2018-04-06 NOTE — Telephone Encounter (Signed)
Called pt, mother states he is in his room. I asked her to have him return call.

## 2018-04-10 ENCOUNTER — Encounter: Payer: Self-pay | Admitting: Adult Health

## 2018-04-10 ENCOUNTER — Ambulatory Visit: Payer: Medicare HMO | Admitting: Adult Health

## 2018-04-10 VITALS — BP 131/72 | HR 89 | Ht 73.0 in | Wt 157.0 lb

## 2018-04-10 DIAGNOSIS — E1051 Type 1 diabetes mellitus with diabetic peripheral angiopathy without gangrene: Secondary | ICD-10-CM | POA: Diagnosis not present

## 2018-04-10 DIAGNOSIS — E782 Mixed hyperlipidemia: Secondary | ICD-10-CM

## 2018-04-10 DIAGNOSIS — I10 Essential (primary) hypertension: Secondary | ICD-10-CM | POA: Diagnosis not present

## 2018-04-10 DIAGNOSIS — I63512 Cerebral infarction due to unspecified occlusion or stenosis of left middle cerebral artery: Secondary | ICD-10-CM

## 2018-04-10 NOTE — Progress Notes (Signed)
[facility] [address] [place]. [place] [address] [phone]       OFFICE FOLLOW UP NOTE  [name] Date of Birth:  [birth year] Medical Record Number:  [id number]   Reason for Referral:  hospital stroke follow up  CHIEF COMPLAINT:  Chief Complaint  Patient presents with  . Follow-up    Stroke hospital follow up saw Dr. Erlinda Hong in hospital , pt with [name] his mom    HPI: Nathaniel [name] is being seen today for initial visit in the office for left inferior frontal, left thalamic/PLIC infarct, right SCA and left PICA small/punctate infarcts on 02/16/2018. History obtained from patient and chart review. Reviewed all radiology images and labs personally.  Mr. [name] is a 32 y.o. male with history of uncontrolled type I DM, recurrent DKAs, HTN, HLD, and current smoker who was admitted to [facility] on 02/16/18 for 2 day hx of right UEtingling/numbness, right LE weakness and gait instability.Found to bein DKA with a glucose level of 840.  MRI head reviewed and showed acute and subacute left inferior frontal, left thalamus/PLIC infarcts, right ICA and left PICA small/punctate infarcts with an embolic pattern but also considered small vessel lacunar strokes.  MRA showed bilateral moderate to severe stenosis of bilateral ICA siphons.  Carotid ultrasound unremarkable.  2D echo showed EF of 55 to 60%.  LDL 201 and A1c 10.0.  UDS positive for THC.  He was started on aspirin 162 mg and Lipitor 20 mg on discharge to [facility].  Over the course and CIR, his glucose still uncontrolled will fluctuating from hypoglycemia to hyperglycemia.  Neurology was reconsulted for further evaluation of stroke.  Etiology of his strokes could be multifactorial.  Could be synchronized small vessel infarcts given several stroke risk factors including uncontrolled DM, significant HLD, smoker and HTN.  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  Unable to undergo LP while inpatient due to Lovenox use and patient was treated for neurosyphilis.  Due to young patient with embolic pattern, TCD bubble study was performed which was negative.  It was recommended DAPT for 3 weeks and then aspirin alone.  Possibly a 30-day cardiac monitor outpatient to rule out atrial fibrillation as cause of stroke.  Due to elevated LDL at 201, recommended to increase Lipitor to 80 mg.  Smoking cessation with counseling provided.  Patient was discharged home in stable condition.  Patient is being seen today for hospital follow-up and is accompanied by his mother.  He continues to have issues with his balance but overall has been recovering well.  He recently has completed home PT/OT but continues to use rolling walker due to balance issues.  Patient did undergo 30-day cardiac monitor which was negative for atrial fibrillation.  Completed 3 weeks of DAPT with aspirin and Plavix and has continued on aspirin only without side effects of bleeding or bruising.  Continues to take Lipitor without side effects of myalgias.  Blood glucose levels have continued to be uncontrolled as this morning BG low 40s but can also quickly be elevated.  He has been told in the past that he is a "brittle diabetic" and continues to follow with endocrinologist regarding diabetic management.  Blood pressure today satisfactory at 131/72.  He does continue to smoke but has greatly reduced amount from smoking previous half pack to 1 pack/day to now 3 to 4 cigarettes/day.  Patient is planning on continuing to decrease this amount  until he is able to quit.  Patient questioning when he can start driving again.  Denies new or worsening stroke/TIA symptoms.  ROS:   14 system review of systems performed and negative with exception of no complaints  PMH:  Past Medical History:  Diagnosis Date  . Diabetes mellitus    lantus/novolog  . Hyperlipidemia   . Hypertension   . Marijuana abuse     occaisionally  . Noncompliance with medication regimen   . Stroke ([place])   . Tobacco abuse    5/day    PSH:  Past Surgical History:  Procedure Laterality Date  . EYE SURGERY      Social History:  Social History   Socioeconomic History  . Marital status: Single    Spouse name: Not on file  . Number of children: Not on file  . Years of education: Not on file  . Highest education level: Not on file  Occupational History  . Occupation: disabled  Social Needs  . Financial resource strain: Not on file  . Food insecurity:    Worry: Not on file    Inability: Not on file  . Transportation needs:    Medical: Not on file    Non-medical: Not on file  Tobacco Use  . Smoking status: Current Every Day Smoker    Packs/day: 0.25    Years: 8.00    Pack years: 2.00    Types: Cigarettes  . Smokeless tobacco: Never Used  . Tobacco comment: smoke 3 to 4 per day  Substance and Sexual Activity  . Alcohol use: No  . Drug use: Yes    Types: Marijuana    Comment: last use yesterday, every other days   . Sexual activity: Yes    Birth control/protection: Condom  Lifestyle  . Physical activity:    Days per week: Not on file    Minutes per session: Not on file  . Stress: Not on file  Relationships  . Social connections:    Talks on phone: Not on file    Gets together: Not on file    Attends religious service: Not on file    Active member of club or organization: Not on file    Attends meetings of clubs or organizations: Not on file    Relationship status: Not on file  . Intimate partner violence:    Fear of current or ex partner: Not on file    Emotionally abused: Not on file    Physically abused: Not on file    Forced sexual activity: Not on file  Other Topics Concern  . Not on file  Social History Narrative  . Not on file    Family History:  Family History  Problem Relation Age of Onset  . Diabetes Father   . Kidney failure Father        last 4 mnths of life  .  Hypertension Mother   . Diabetes Maternal Grandmother   . Pancreatic cancer Maternal Grandfather   . Diabetes Paternal Grandfather     Medications:   Current Outpatient Medications on File Prior to Visit  Medication Sig Dispense Refill  . acetaminophen (TYLENOL) 325 MG tablet Take 1-2 tablets (325-650 mg total) by mouth every 4 (four) hours as needed for mild pain.    Marland Kitchen amLODipine (NORVASC) 10 MG tablet Take 1 tablet (10 mg total) by mouth daily. 30 tablet 0  . aspirin EC 325 MG tablet Take 1 tablet (325 mg total) by mouth daily. [place]  tablet 0  . atorvastatin (LIPITOR) 80 MG tablet Take 1 tablet (80 mg total) by mouth daily at 6 PM. 30 tablet 0  . insulin aspart (NOVOLOG) 100 UNIT/ML injection Use 5 units with lunch and supper 10 mL 11  . insulin detemir (LEVEMIR) 100 UNIT/ML injection Use 16 units with breakfast and 16 units with supper daily (Patient taking differently: Take  50 units in am and 10-40 at night its a range) 10 mL 11  . lisinopril (PRINIVIL,ZESTRIL) 20 MG tablet Take 20 mg by mouth daily.    . Menthol-Methyl Salicylate (MUSCLE RUB) 10-15 % CREA Apply 1 application topically as needed for muscle pain. To left shoulder  0  . clopidogrel (PLAVIX) 75 MG tablet Take 75 mg by mouth daily.  5   No current facility-administered medications on file prior to visit.     Allergies:   Allergies  Allergen Reactions  . Fructose Other (See Comments)    Increase of blood sugar      Physical Exam  Vitals:   04/10/18 1524  BP: 131/72  Pulse: 89  Weight: 157 lb (71.2 kg)  Height: 6\' 1"  (1.854 m)   Body mass index is 20.71 kg/m. No exam data present  General: well developed, well nourished, seated, pleasant young African-American male, in no evident distress Head: head normocephalic and atraumatic.   Neck: supple with no carotid or supraclavicular bruits Cardiovascular: regular rate and rhythm, no murmurs Musculoskeletal: no deformity Skin:  no rash/petichiae Vascular:   Normal pulses all extremities  Neurologic Exam Mental Status: Awake and fully alert. Oriented to place and time. Recent and remote memory intact. Attention span, concentration and fund of knowledge appropriate. Mood and affect appropriate.  Cranial Nerves: Fundoscopic exam reveals sharp disc margins. Pupils equal, briskly reactive to light. Extraocular movements full without nystagmus. Visual fields full to confrontation. Hearing intact. Facial sensation intact. Face, tongue, palate moves normally and symmetrically.  Motor: Normal bulk and tone. Normal strength in all tested extremity muscles except for mild weakness in RLE with mild ataxia due to weakness Sensory.: intact to touch , pinprick , position and vibratory sensation.  Coordination: Rapid alternating movements normal in all extremities. Finger-to-nose and heel-to-shin performed accurately bilaterally. Gait and Station: Arises from chair without difficulty. Stance is normal. Gait demonstrates slow cautious steps with a hunched gait and assistance of rolling walker. Reflexes: 1+ and symmetric. Toes downgoing.    NIHSS  1 Modified Rankin  2    Diagnostic Data (Labs, Imaging, Testing)  CT HEAD WO CONTRAST 02/16/18 IMPRESSION: 1. New LEFT internal capsule white matter changes seen with chronic small vessel ischemic disease though, given RIGHT-sided numbness this may be acute. 2. LEFT middle ear and mastoid effusion. 3. Mild atherosclerosis.  MR BRAIN WO CONTRAST MR MRA HEAD WO CONTRAST 02/17/18 IMPRESSION: 1. Acute lacunar infarct of the Left lateral thalamus/internal capsule corresponding to the CT finding yesterday. No associated hemorrhage or mass effect. 2. Additional small acute lacunar infarcts in the anterior left frontal lobe subcortical white matter (Left MCA territory). Subacute infarct of the Right superior cerebellum (Right SCA territory). No hemorrhage or mass effect. 3. Multiple underlying chronic lacunar  infarcts in the cerebellum and the right pons are new since 2015. 4. Intracranial MRA remarkable for moderate irregularity and stenosis of the bilateral cavernous ICAs. This is suspicious for advanced ICA atherosclerosis in this clinical setting, but might be exacerbated by artifact from hyperplastic sphenoid paranasal sinuses.  ECHOCARDIOGRAM 02/17/18 Study Conclusions - Left ventricle: The  cavity size was normal. Wall thickness was   increased in a pattern of mild LVH. Systolic function was normal.   The estimated ejection fraction was in the range of 55% to 60%.   Wall motion was normal; there were no regional wall motion   abnormalities. Left ventricular diastolic function parameters   were normal for the patient&'s age. - Mitral valve: Mildly thickened leaflets. There was mild   regurgitation. - Right atrium: Central venous pressure (est): 3 mm Hg. - Atrial septum: No defect or patent foramen ovale was identified. - Tricuspid valve: There was trivial regurgitation. - Pericardium, extracardiac: There was no pericardial effusion.    ASSESSMENT: TATEN MERROW is a 32 y.o. year old male here with cryptogenic multiple infarcts on 02/16/2018. Vascular risk factors include HLD, HTN and DM.     PLAN: -Continue aspirin 325 mg daily  and Lipitor for secondary stroke prevention -Referral placed for continued outpatient PT/OT at [facility] outpatient rehabilitation center at [facility] -Patient not cleared to begin driving at this time due to continued mild right lower extremity weakness and as patient has difficulty controlling right foot at times -advised patient to continue PT/OT and they will give him the okay to start driving when safe -F/u with PCP regarding your HLD and HTN management -f/u with endocrinologist regarding diabetic management -continue to monitor BP at home -Maintain strict control of hypertension with blood pressure goal below 130/90, diabetes with hemoglobin  A1c goal below 6.5% and cholesterol with LDL cholesterol (bad cholesterol) goal below 70 mg/dL. I also advised the patient to eat a healthy diet with plenty of whole grains, cereals, fruits and vegetables, exercise regularly and maintain ideal body weight.  Follow up in 6 months or call earlier if needed   Greater than 50% of time during this 25 minute visit was spent on counseling,explanation of diagnosis of cryptogenic multiple infarcts, reviewing risk factor management of HLD, HTN and DM, planning of further management, discussion with patient and family and coordination of care    Venancio Poisson, [facility]  [facility] [address] [place], [place] [address]  Phone [phone] Fax [phone]

## 2018-04-10 NOTE — Patient Instructions (Addendum)
Continue aspirin 325 mg daily  and lipitor  for secondary stroke prevention  Referred to PT/OT at [facility] [place] to follow up with PCP regarding cholesterol and blood pressure management   Continue to follow up with endocrinologist for diabetes management  Maintain strict control of hypertension with blood pressure goal below 130/90, diabetes with hemoglobin A1c goal below 6.5% and cholesterol with LDL cholesterol (bad cholesterol) goal below 70 mg/dL. I also advised the patient to eat a healthy diet with plenty of whole grains, cereals, fruits and vegetables, exercise regularly and maintain ideal body weight.  Followup in the future with me in 6 months or call earlier if needed        Thank you for coming to see [place] at [facility]. I hope we have been able to provide you high quality care today.  You may receive a patient satisfaction survey over the next few weeks. We would appreciate your feedback and comments so that we may continue to improve ourselves and the health of our patients.

## 2018-04-11 NOTE — Progress Notes (Signed)
I agree with the above plan 

## 2018-04-21 ENCOUNTER — Other Ambulatory Visit: Payer: Self-pay | Admitting: Physical Medicine & Rehabilitation

## 2018-04-21 DIAGNOSIS — I63512 Cerebral infarction due to unspecified occlusion or stenosis of left middle cerebral artery: Secondary | ICD-10-CM

## 2018-04-26 ENCOUNTER — Encounter: Payer: Medicare HMO | Attending: Physical Medicine & Rehabilitation | Admitting: Physical Medicine & Rehabilitation

## 2018-04-26 ENCOUNTER — Encounter: Payer: Self-pay | Admitting: Physical Medicine & Rehabilitation

## 2018-04-26 VITALS — BP 143/98 | HR 89 | Ht 74.0 in | Wt 158.0 lb

## 2018-04-26 DIAGNOSIS — E1051 Type 1 diabetes mellitus with diabetic peripheral angiopathy without gangrene: Secondary | ICD-10-CM

## 2018-04-26 DIAGNOSIS — F17210 Nicotine dependence, cigarettes, uncomplicated: Secondary | ICD-10-CM | POA: Insufficient documentation

## 2018-04-26 DIAGNOSIS — E1010 Type 1 diabetes mellitus with ketoacidosis without coma: Secondary | ICD-10-CM | POA: Insufficient documentation

## 2018-04-26 DIAGNOSIS — I129 Hypertensive chronic kidney disease with stage 1 through stage 4 chronic kidney disease, or unspecified chronic kidney disease: Secondary | ICD-10-CM | POA: Diagnosis not present

## 2018-04-26 DIAGNOSIS — Z9114 Patient's other noncompliance with medication regimen: Secondary | ICD-10-CM | POA: Diagnosis not present

## 2018-04-26 DIAGNOSIS — R531 Weakness: Secondary | ICD-10-CM | POA: Diagnosis not present

## 2018-04-26 DIAGNOSIS — N181 Chronic kidney disease, stage 1: Secondary | ICD-10-CM | POA: Insufficient documentation

## 2018-04-26 DIAGNOSIS — Z794 Long term (current) use of insulin: Secondary | ICD-10-CM | POA: Insufficient documentation

## 2018-04-26 DIAGNOSIS — Z8249 Family history of ischemic heart disease and other diseases of the circulatory system: Secondary | ICD-10-CM | POA: Insufficient documentation

## 2018-04-26 DIAGNOSIS — Z833 Family history of diabetes mellitus: Secondary | ICD-10-CM | POA: Diagnosis not present

## 2018-04-26 DIAGNOSIS — Z09 Encounter for follow-up examination after completed treatment for conditions other than malignant neoplasm: Secondary | ICD-10-CM | POA: Insufficient documentation

## 2018-04-26 DIAGNOSIS — Z800 Family history of malignant neoplasm of digestive organs: Secondary | ICD-10-CM | POA: Insufficient documentation

## 2018-04-26 DIAGNOSIS — Z841 Family history of disorders of kidney and ureter: Secondary | ICD-10-CM | POA: Insufficient documentation

## 2018-04-26 DIAGNOSIS — I63512 Cerebral infarction due to unspecified occlusion or stenosis of left middle cerebral artery: Secondary | ICD-10-CM | POA: Diagnosis not present

## 2018-04-26 DIAGNOSIS — I10 Essential (primary) hypertension: Secondary | ICD-10-CM

## 2018-04-26 NOTE — Patient Instructions (Signed)
PLEASE FEEL FREE TO CALL OUR OFFICE WITH ANY PROBLEMS OR QUESTIONS ([phone])      

## 2018-04-26 NOTE — Progress Notes (Signed)
Subjective:    Patient ID: Nathaniel [name], male    DOB: 1986/04/09, 32 y.o.   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  I last saw him about a month or so ago.  He has completed home health therapies.  He still is using his rolling walker but has noticed some improvement.  He does do some walking for short distances without device.  Denies any falls.  He states that home health therapy has mentioned outpatient rehab.  It appears that neurology made a referral to outpatient rehab as well.  They live in [place] and prefer to have therapy closer to home.  I asked Nathaniel [name] about his sugar control he states that he checks his sugars daily.  Yet he did not check his this morning.  He states his sugars have been better controlled sometimes still ranging from in the 30s and 40s up to 400 however.  He has an appointment to see endocrinology next week.  He remains on insulin as prescribed.   Pain Inventory Average Pain 0 Pain Right Now 0 My pain is na  In the last 24 hours, has pain interfered with the following? General activity 0 Relation with others 0 Enjoyment of life 0 What TIME of day is your pain at its worst? na Sleep (in general) Fair  Pain is worse with: na Pain improves with: na Relief from Meds: na  Mobility walk without assistance ability to climb steps?  yes do you drive?  no  Function disabled: date disabled .  Neuro/Psych tingling trouble walking  Prior Studies Any changes since last visit?  no  Physicians involved in your care Any changes since last visit?  no   Family History  Problem Relation Age of Onset  . Diabetes Father   . Kidney failure Father        last 4 mnths of life  . Hypertension Mother   . Diabetes Maternal Grandmother   . Pancreatic cancer Maternal Grandfather   . Diabetes Paternal Grandfather    Social History   Socioeconomic History  . Marital status: Single    Spouse name: Not on file  . Number  of children: Not on file  . Years of education: Not on file  . Highest education level: Not on file  Occupational History  . Occupation: disabled  Social Needs  . Financial resource strain: Not on file  . Food insecurity:    Worry: Not on file    Inability: Not on file  . Transportation needs:    Medical: Not on file    Non-medical: Not on file  Tobacco Use  . Smoking status: Current Every Day Smoker    Packs/day: 0.25    Years: 8.00    Pack years: 2.00    Types: Cigarettes  . Smokeless tobacco: Never Used  . Tobacco comment: smoke 3 to 4 per day  Substance and Sexual Activity  . Alcohol use: No  . Drug use: Yes    Types: Marijuana    Comment: last use yesterday, every other days   . Sexual activity: Yes    Birth control/protection: Condom  Lifestyle  . Physical activity:    Days per week: Not on file    Minutes per session: Not on file  . Stress: Not on file  Relationships  . Social connections:    Talks on phone: Not on file    Gets together: Not on file  Attends religious service: Not on file    Active member of club or organization: Not on file    Attends meetings of clubs or organizations: Not on file    Relationship status: Not on file  Other Topics Concern  . Not on file  Social History Narrative  . Not on file   Past Surgical History:  Procedure Laterality Date  . EYE SURGERY     Past Medical History:  Diagnosis Date  . Diabetes mellitus    lantus/novolog  . Hyperlipidemia   . Hypertension   . Marijuana abuse    occaisionally  . Noncompliance with medication regimen   . Stroke ([place])   . Tobacco abuse    5/day   Ht 6\' 2"  (1.88 m)   Wt 158 lb (71.7 kg)   BMI 20.29 kg/m   Opioid Risk Score:   Fall Risk Score:  `1  Depression screen PHQ 2/9  No flowsheet data found.   Review of Systems  Constitutional: Negative.   HENT: Negative.   Eyes: Negative.   Respiratory: Negative.   Cardiovascular: Negative.   Gastrointestinal: Negative.    Endocrine: Negative.   Genitourinary: Negative.   Musculoskeletal: Positive for gait problem.  Skin: Negative.   Allergic/Immunologic: Negative.   Neurological: Positive for numbness.  Hematological: Negative.   Psychiatric/Behavioral: Negative.        Objective:   Physical Exam  General: No acute distress HEENT: EOMI, oral membranes moist Cards: reg rate  Chest: normal effort Abdomen: Soft, NT, ND Skin: dry, intact Extremities: no edema  Neurological: He is alert and oriented to person, place, and time.  Motor: right sensory loss Upper and lower 1/2--seems somewhat improved but he still lacks proprioception especially with his right leg..  Right upper extremity motor exam has improved to 4- to 4 out of 5 right upper and right lower extremities.  He walks with some circumduction in his gait and tends to wipe his heel out laterally to help with clearance of the foot.  Does fairly well when holding the walker but struggles without a device to advance. Nonfocal cranial nerve exam Skin: Skin is warm and dry.  Psychiatric: He has a normal mood and affect. His behavior is normal.          Assessment & Plan:  1. Right sided weakness and sensory deficits secondary to bilateral CVA. -make referral to outpt therapies at [facility]  -needs walker at all times  -no driving.   2.  T1DM with DKA: Multiple episodes of DKA with Hgb A1C- 10.  - mealtime covg 4u, TID with meals - levemir: 16 u bid   - Dr. Dwyane Dee  Follow up next week             -reiterated importance of diet regulation             -needs log of sugars and meals 9. HTN: continue norvasc to 10mg   -CKD l -strict control discussed 10. Positive RPR/T Pallidum:  -s/p IV pen and bicillin per ID               15 minutes of face to face patient care time were spent during this visit. All questions were encouraged and answered.  Follow up in 2 months.

## 2018-05-01 ENCOUNTER — Encounter: Payer: Self-pay | Admitting: Endocrinology

## 2018-05-01 NOTE — Progress Notes (Deleted)
Patient ID: Nathaniel [name], male   DOB: 1986/02/16, 32 y.o.   MRN: [id number]          Reason for Appointment : Consultation for Type 1 Diabetes  History of Present Illness          Diagnosis: Type 1 diabetes mellitus, date of diagnosis:          Previous history:    Recent history:     INSULIN regimen is:   Current management, blood sugar patterns and problems identified:            Glucose monitoring:  is being done 3 times a day         Glucometer: One Touch.      Blood Glucose readings from meter download:    PRE-MEAL Fasting Lunch Dinner Bedtime Overall  Glucose range:       Mean/median:        POST-MEAL PC Breakfast PC Lunch PC Dinner  Glucose range:     Mean/median:       Hypoglycemia:  occurs at Factors causing hyperglycemia: Symptoms of hypoglycemia: Treatment of hypoglycemia:          Self-care: The diet that the patient has been following is:  Mealtimes are: Breakfast at  Lunch: Dinner:          Exercise:          Dietician consultation: Most recent:.         CDE consultation:  Diabetes labs:  Lab Results  Component Value Date   HGBA1C 10.0 (H) 02/19/2018   HGBA1C 10.1 (H) 02/17/2018   HGBA1C 10.3 (H) 10/21/2016   Lab Results  Component Value Date   LDLCALC 201 (H) 02/19/2018   CREATININE 1.99 (H) 03/08/2018    No results found for: MICRALBCREAT   Allergies as of 05/02/2018      Reactions   Fructose Other (See Comments)   Increase of blood sugar      Medication List        Accurate as of 05/01/18  9:34 PM. Always use your most recent med list.          acetaminophen 325 MG tablet Commonly known as:  TYLENOL Take 1-2 tablets (325-650 mg total) by mouth every 4 (four) hours as needed for mild pain.   amLODipine 10 MG tablet Commonly known as:  NORVASC Take 1 tablet (10 mg total) by mouth daily.   aspirin 325 MG EC tablet TAKE 1 TABLET BY MOUTH EVERY DAY   atorvastatin 80 MG tablet Commonly known as:  LIPITOR Take  1 tablet (80 mg total) by mouth daily at 6 PM.   clopidogrel 75 MG tablet Commonly known as:  PLAVIX Take 75 mg by mouth daily.   insulin aspart 100 UNIT/ML injection Commonly known as:  novoLOG Use 5 units with lunch and supper   insulin detemir 100 UNIT/ML injection Commonly known as:  LEVEMIR Use 16 units with breakfast and 16 units with supper daily   lisinopril 20 MG tablet Commonly known as:  PRINIVIL,ZESTRIL Take 20 mg by mouth daily.   MUSCLE RUB 10-15 % Crea Apply 1 application topically as needed for muscle pain. To left shoulder       Allergies:  Allergies  Allergen Reactions  . Fructose Other (See Comments)    Increase of blood sugar     Past Medical History:  Diagnosis Date  . Diabetes mellitus    lantus/novolog  . Hyperlipidemia   . Hypertension   .  Marijuana abuse    occaisionally  . Noncompliance with medication regimen   . Stroke ([place])   . Tobacco abuse    5/day    Past Surgical History:  Procedure Laterality Date  . EYE SURGERY      Family History  Problem Relation Age of Onset  . Diabetes Father   . Kidney failure Father        last 4 mnths of life  . Hypertension Mother   . Diabetes Maternal Grandmother   . Pancreatic cancer Maternal Grandfather   . Diabetes Paternal Grandfather     Social History:  reports that he has been smoking cigarettes. He has a 2.00 pack-year smoking history. He has never used smokeless tobacco. He reports that he has current or past drug history. Drug: Marijuana. He reports that he does not drink alcohol.      Review of Systems      Lipids:    LABS:  No visits with results within 1 Week(s) from this visit.  Latest known visit with results is:  No results displayed because visit has over 200 results.      Physical Examination:  There were no vitals taken for this visit.  GENERAL:  HEENT:         Eye exam shows normal external appearance. Fundus exam shows no retinopathy. Oral exam shows  normal mucosa .  NECK:         there is no lymphadenopathy.   Thyroid is not enlarged and no nodules felt.   LUNGS:         Chest is symmetrical. Lungs are clear to auscultation.Marland Kitchen   HEART:         Heart sounds:  S1 and S2 are normal. No murmurs or clicks heard., no S3 or S4.   ABDOMEN:  no distention present. Liver and spleen are not palpable. No other mass or tenderness present.  EXTREMITIES:     There is no edema. No skin lesions present.Marland Kitchen  NEUROLOGICAL:        Vibration sense is *** reduced in toes. Ankle jerks are normal bilaterally.      Diabetic Foot Exam - Simple   No data filed          MUSCULOSKELETAL:       There is no enlargement or deformity of the joints.  SKIN:       No rash, lesions or abnormal pigmentation       ASSESSMENT:  Diabetes type 1,  Problems identified:     Complications:  PLAN:   There are no Patient Instructions on file for this visit.    Elayne Snare 05/01/2018, 9:34 PM   Note: This note was prepared with Dragon voice recognition system technology. Any transcriptional errors that result from this process are unintentional.

## 2018-05-02 ENCOUNTER — Ambulatory Visit: Payer: Medicare HMO | Admitting: Endocrinology

## 2018-05-11 ENCOUNTER — Ambulatory Visit (HOSPITAL_COMMUNITY): Payer: Medicare HMO

## 2018-05-11 ENCOUNTER — Ambulatory Visit (HOSPITAL_COMMUNITY): Payer: Medicare HMO | Attending: Adult Health | Admitting: Occupational Therapy

## 2018-05-11 ENCOUNTER — Other Ambulatory Visit: Payer: Self-pay

## 2018-05-11 ENCOUNTER — Encounter (HOSPITAL_COMMUNITY): Payer: Self-pay

## 2018-05-11 DIAGNOSIS — R2689 Other abnormalities of gait and mobility: Secondary | ICD-10-CM | POA: Insufficient documentation

## 2018-05-11 DIAGNOSIS — R278 Other lack of coordination: Secondary | ICD-10-CM

## 2018-05-11 DIAGNOSIS — I63512 Cerebral infarction due to unspecified occlusion or stenosis of left middle cerebral artery: Secondary | ICD-10-CM | POA: Diagnosis present

## 2018-05-11 NOTE — Therapy (Signed)
[facility] [address] [place], [place], [address] Phone: [phone]   Fax:  [phone]  Occupational Therapy Evaluation  Patient Details  Name: Nathaniel [name] MRN: [id number] Date of Birth: 11/13/1985 Referring Provider: Venancio Poisson, NP   Encounter Date: 05/11/2018  OT End of Session - 05/11/18 1626    Visit Number  1    Number of Visits  1    Date for OT Re-Evaluation  05/12/18    Authorization Type  [employer]-$40 copay    Authorization Time Period  no visit limit    OT Start Time  1436    OT Stop Time  1515    OT Time Calculation (min)  39 min    Activity Tolerance  Patient tolerated treatment well    Behavior During Therapy  [facility] for tasks assessed/performed       Past Medical History:  Diagnosis Date  . Diabetes mellitus    lantus/novolog  . Hyperlipidemia   . Hypertension   . Marijuana abuse    occaisionally  . Noncompliance with medication regimen   . Stroke ([place])   . Tobacco abuse    5/day    Past Surgical History:  Procedure Laterality Date  . EYE SURGERY      There were no vitals filed for this visit.  Subjective Assessment - 05/11/18 1619    Subjective   S: I can dress myself.     Patient is accompained by:  Family member   Mother   Pertinent History  Pt is a 32 y/o male s/p left MCA CVA on 02/16/2018 resulting in right hemiparesis and coordination deficits. Pt was at [facility] from 6/5-6/19 then discharged home and received [facility] services. Pt was referred to occupational therapy for evaluation and treatment by Venancio Poisson, NP.     Patient Stated Goals  To improve my writing and drawing    Currently in Pain?  No/denies        [facility] OT Assessment - 05/11/18 1433      Assessment   Medical Diagnosis  s/p left CVA    Referring Provider  Venancio Poisson, NP    Onset Date/Surgical Date  02/16/18    Hand Dominance  Right    Prior Therapy  CIR at [facility] rehab      Precautions   Precautions  Fall      Balance Screen   Has the patient fallen in the past 6 months  Yes    How many times?  1    Has the patient had a decrease in activity level because of a fear of falling?   No    Is the patient reluctant to leave their home because of a fear of falling?   No      Prior Function   Level of Independence  Needs assistance with ADLs    Vocation  On disability    Leisure  drawing-sketching, playing video games, playing with nephews      ADL   ADL comments  Pt requires assistance with activites such as running water for a shower/bath, meal preparation, and additional mobility tasks. He is independent with basic ADL completion      Written Expression   Dominant Hand  Right      Cognition   Overall Cognitive Status  Within Functional Limits for tasks assessed      Sensation   Light Touch  Appears Intact    Semmes Weinstein Monofilament Scale  Diminished Light Touch      Coordination   9 Hole Peg Test  Right;Left    Right 9 Hole Peg Test  1\' 02"     Left 9 Hole Peg Test  58"      ROM / Strength   AROM / PROM / Strength  Strength      Strength   Overall Strength Comments  Assessed seated, er/IR adducted    Strength Assessment Site  Shoulder;Elbow;Hand;Wrist    Right/Left Shoulder  Right    Right Shoulder Flexion  4+/5    Right Shoulder ABduction  4+/5    Right Shoulder Internal Rotation  5/5    Right Shoulder External Rotation  5/5    Right/Left Elbow  Right    Right Elbow Flexion  4+/5    Right Elbow Extension  4+/5    Right/Left Wrist  Right    Right Wrist Flexion  5/5    Right Wrist Extension  5/5    Right Wrist Radial Deviation  5/5    Right Wrist Ulnar Deviation  5/5    Right/Left hand  Right;Left    Right Hand Gross Grasp  Functional    Right Hand Grip (lbs)  87    Right Hand Lateral Pinch  15 lbs    Right Hand 3 Point Pinch  12 lbs    Left Hand Gross Grasp  Functional    Left Hand Grip (lbs)  68    Left Hand Lateral Pinch  15 lbs    Left  Hand 3 Point Pinch  15 lbs                      OT Education - 05/11/18 1624    Education Details  provided pt with lined paper to practice handwriting, progressing from thick lines to thin lines. Educated on practicing handwriting and drawing at home to improve    Person(s) Educated  Patient;Parent(s)    Methods  Explanation;Handout    Comprehension  Verbalized understanding                 Plan - 05/11/18 1627    Clinical Impression Statement  A: Pt is a 32 y/o male s/p left MCA CVA on 02/16/18 presenting for evaluation of right weakness. Pt demonstrating RUE strength and coordination WNL, he is independent in ADL completion as well. Pt reports his primary concern now is his walking and ability to draw. Educated pt on tips and strategies for improving drawing    Occupational Profile and client history currently impacting functional performance  Pt is motivated to return to highest level of functioning at home and in community    Occupational performance deficits (Please refer to evaluation for details):  IADL's    Rehab Potential  Good    OT Frequency  One time visit    OT Treatment/Interventions  Patient/family education    Plan  P: Pt educated on tips/strategies for handwriting and drawing. No further OT services necessary at this time    Clinical Decision Making  Limited treatment options, no task modification necessary       Patient will benefit from skilled therapeutic intervention in order to improve the following deficits and impairments:  Decreased coordination  Visit Diagnosis: Acute ischemic left MCA stroke [facility])  Other lack of coordination    Problem List Patient Active Problem List   Diagnosis Date Noted  . Stage 3 chronic kidney disease ([place])   . Hypoglycemia   .  Neurosyphilis   . Mixed hyperlipidemia   . Acute ischemic left MCA stroke ([place]) 02/22/2018  . Abnormality of gait following cerebrovascular accident (CVA)   . Type 1 diabetes  mellitus with peripheral circulatory complications ([place])   . Essential hypertension   . Dyslipidemia   . Syphilis   . DKA (diabetic ketoacidoses) ([place]) 02/16/2018  . CVA (cerebral vascular accident) ([place]) 02/16/2018  . Acute renal failure (ARF) ([place]) 10/21/2016  . Diabetic ketoacidosis without coma associated with type 1 diabetes mellitus ([place])   . Hyperbilirubinemia   . Hyponatremia 04/15/2016  . DM (diabetes mellitus) type 1, uncontrolled, with ketoacidosis ([place]) 04/15/2016  . Hyperglycemia 04/15/2016  . AKI (acute kidney injury) ([place]) 01/04/2016  . Malignant hypertension 01/04/2016  . Noncompliance with medications 01/04/2016  . Intractable nausea and vomiting 04/01/2015  . Gastroparesis 04/01/2015  . Type 1 diabetes mellitus with complication ([place]) 91/69/4503  . Chronic hypertension   . Nausea with vomiting   . Abscess, gluteal, right 06/21/2014  . Nausea and vomiting 04/08/2013  . Hyperglycemia without ketosis 04/08/2013  . Essential hypertension, benign 04/08/2013  . Hypercalcemia 07/29/2011  . Vomiting 07/28/2011  . Leukocytosis 07/28/2011  . Hypokalemia 07/28/2011  . DKA, type 1 ([place]) 07/27/2011  . Dehydration 07/27/2011  . Smoker 07/27/2011  . Marijuana abuse 07/27/2011   Guadelupe Sabin, OTR/L  [phone] 05/11/2018, 4:31 PM  [facility] [address] [place], [place], [address] Phone: [phone]   Fax:  [phone]  Name: HAYLEN SHELNUTT MRN: [id number] Date of Birth: Jun 25, 1986

## 2018-05-11 NOTE — Therapy (Signed)
[place], [place], [address] Phone: [phone]   Fax:  [phone]  Physical Therapy Evaluation  Patient Details  Name: KAYCEN WHITWORTH MRN: [id number] Date of Birth: 1986-03-19 Referring Provider: Venancio Poisson, NP   Encounter Date: 05/11/2018  PT End of Session - 05/11/18 1655    Visit Number  1    Number of Visits  13    Date for PT Re-Evaluation  06/22/18    Authorization Type  [employer] Medicare HMO (no visit limit, no auth required)    Authorization Time Period  05/11/18 - 06/23/18    Authorization - Visit Number  1    Authorization - Number of Visits  10    PT Start Time  1519    PT Stop Time  0932    PT Time Calculation (min)  45 min    Activity Tolerance  Patient tolerated treatment well    Behavior During Therapy  [facility] for tasks assessed/performed       Past Medical History:  Diagnosis Date  . Diabetes mellitus    lantus/novolog  . Hyperlipidemia   . Hypertension   . Marijuana abuse    occaisionally  . Noncompliance with medication regimen   . Stroke ([place])   . Tobacco abuse    5/day    Past Surgical History:  Procedure Laterality Date  . EYE SURGERY      There were no vitals filed for this visit.   Subjective Assessment - 05/11/18 1657    Subjective  Patient reports his greatest limitation remains walking and balance. He states he has trouble coordinating his steps and that he still feels weaker on his Rt side. He has assistance 24/7 available form his mother who he lives with. He reports he had a follow up recently with his MD from [facility], and had a follow up with Venancio Poisson for neurology. He will see her again in 6 months. He was unable to attend his endocrinology appointment last week and is working on re-scheduling this now.     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 Pt was at [facility] from 6/5-6/19 then discharged home and received [facility] services    Limitations  Lifting;Standing;Walking;Writing;House hold activities    How long can you sit comfortably?  unlimited    How long can you stand comfortably?  several minutes    How long can you walk comfortably?  household distances    Patient Stated Goals  to be able to walk without a device    Currently in Pain?  No/denies         [facility] PT Assessment - 05/11/18 1524      Assessment   Medical Diagnosis  S/P left MCA stroke     Referring Provider  Venancio Poisson, NP    Onset Date/Surgical Date  02/16/18    Hand Dominance  Right    Prior Therapy  CIR at [facility] rehab      Precautions   Precautions  Fall      Restrictions   Weight Bearing Restrictions  No      Balance Screen   Has the patient fallen in the past 6 months  Yes    How many times?  2   [facility]   Has the patient had a  decrease in activity level because of a fear of falling?   No    Is the patient reluctant to leave their home because of a fear of falling?   No      Home Social worker  Private residence    Living Arrangements  Parent    Available Help at Discharge  Family    Type of [place] to enter    Entrance Stairs-Number of Steps  4    Entrance Stairs-Rails  Can reach both    [place]  One level    [place] - 2 wheels;Wheelchair - manual;Tub bench   standing in shower now     Prior Function   Level of Independence  Needs assistance with ADLs    Vocation  On disability    Leisure  drawing-sketching, playing video games, playing with nephews      Cognition   Overall Cognitive Status  Within Functional Limits for tasks assessed      Observation/Other Assessments   Focus on Therapeutic Outcomes (FOTO)   take next session      Sensation   Proprioception  Impaired Detail    Proprioception Impaired Details   Impaired RLE      Functional Tests   Functional tests  Single leg stance      Single Leg Stance   Comments  Rt LE = 4 seconds; Lt LE = 5 seconds      Posture/Postural Control   Posture/Postural Control  No significant limitations      ROM / Strength   AROM / PROM / Strength  Strength      Strength   Strength Assessment Site  Hip;Knee;Ankle    Right Hip Flexion  4/5    Right Hip Extension  4/5    Right Hip ABduction  4/5    Left Hip Flexion  4+/5    Left Hip Extension  4+/5    Left Hip ABduction  4+/5    Right/Left Knee  Right;Left    Right Knee Flexion  4/5    Right Knee Extension  4/5    Left Knee Flexion  4+/5    Left Knee Extension  5/5    Right Ankle Dorsiflexion  4/5    Left Ankle Dorsiflexion  4+/5      Transfers   Five time sit to stand comments   28.7 without UE support      Ambulation/Gait   Ambulation/Gait  Yes    Ambulation/Gait Assistance  6: Modified independent (Device/Increase time)    Ambulation Distance (Feet)  202 Feet   2 MWT   Assistive device  Rolling walker    Gait Pattern  Step-through pattern;Decreased stride length;Right circumduction;Scissoring;Ataxic;Decreased hip/knee flexion - right;Poor foot clearance - right    Ambulation Surface  Level;Indoor    Gait velocity  0.51 m/s    Stairs  Yes    Stairs Assistance  6: Modified independent (Device/Increase time)    Stair Management Technique  Two rails;Alternating pattern;Forwards    Number of Stairs  4    Height of Stairs  6    Gait Comments  patient with slow gait velocity and some ataxia with Rt LE.      Standardized Balance Assessment   Standardized Balance Assessment  Dynamic Gait Index      Dynamic Gait Index   Level Surface  Mild Impairment    Change in Gait Speed  Mild Impairment    Gait with Horizontal Head Turns  Moderate Impairment    Gait with Vertical Head Turns  Moderate Impairment    Gait and Pivot Turn  Moderate Impairment    Step Over Obstacle  Moderate Impairment     Step Around Obstacles  Mild Impairment    Steps  Mild Impairment    Total Score  12    DGI comment:  12/21 = indicates patient is at risk for falling during ambulation      Functional Gait  Assessment   Gait assessed   Yes    Gait Level Surface  Walks 20 ft, slow speed, abnormal gait pattern, evidence for imbalance or deviates 10-15 in outside of the 12 in walkway width. Requires more than 7 sec to ambulate 20 ft.    Change in Gait Speed  Makes only minor adjustments to walking speed, or accomplishes a change in speed with significant gait deviations, deviates 10-15 in outside the 12 in walkway width, or changes speed but loses balance but is able to recover and continue walking.    Gait with Horizontal Head Turns  Performs head turns with moderate changes in gait velocity, slows down, deviates 10-15 in outside 12 in walkway width but recovers, can continue to walk.    Gait with Vertical Head Turns  Performs task with moderate change in gait velocity, slows down, deviates 10-15 in outside 12 in walkway width but recovers, can continue to walk.    Gait and Pivot Turn  Turns slowly, requires verbal cueing, or requires several small steps to catch balance following turn and stop    Step Over Obstacle  Is able to step over one shoe box (4.5 in total height) but must slow down and adjust steps to clear box safely. May require verbal cueing.    Gait with Narrow Base of Support  Ambulates 4-7 steps.    Gait with Eyes Closed  Walks 20 ft, slow speed, abnormal gait pattern, evidence for imbalance, deviates 10-15 in outside 12 in walkway width. Requires more than 9 sec to ambulate 20 ft.    Ambulating Backwards  Walks 20 ft, uses assistive device, slower speed, mild gait deviations, deviates 6-10 in outside 12 in walkway width.    Steps  Alternating feet, must use rail.    Total Score  12    FGA comment:  12/30 =  indicated he is at risk for falls while ambulating       Objective measurements completed  on examination: See above findings.     Balance Exercises - 05/11/18 1717      Balance Exercises: Standing   Tandem Stance  Eyes open;Intermittent upper extremity support;2 reps;15 secs   bil LE altnating foot alignment   SLS  Eyes open;Solid surface;Intermittent upper extremity support;2 reps;10 secs   2 reps Bil LE       PT Education - 05/11/18 1718    Education Details  Educated on initial HEP and on exam findings. Educated on appropriate POC and timeline of therapy.    Person(s) Educated  Patient    Methods  Explanation;Handout    Comprehension  Verbalized understanding;Returned demonstration       PT Short Term Goals - 05/11/18 1724      PT SHORT TERM GOAL #1   Title  Patient will be indepndent with HEP, updated PRN, to imrpvoe balance, coordination, and strength to improve independence with functional mobility.     Time  3  Period  Weeks    Status  New    Target Date  06/01/18      PT SHORT TERM GOAL #2   Title  Patient will improve DGI by 4 points and FGA by 4 points to demonstrate significnat improvement in balance during ambulation.     Time  3    Period  Weeks    Status  New      PT SHORT TERM GOAL #3   Title  Patient will improve MMT for all limited groups by 1/2 grade or greater and will improve 5x sit to stand by 6 seconds to demonstrate improved functional stregth and independence with mobilty.    Time  3    Period  Weeks    Status  New        PT Long Term Goals - 05/11/18 1731      PT LONG TERM GOAL #1   Title  Patient will ambulate at 1.0 m/s during 2MWT with LRAD to improve community acces with ambulation and reduce fall risk based on gati velocity.    Time  6    Period  Weeks    Status  New    Target Date  06/22/18      PT LONG TERM GOAL #2   Title  Patient will improve DGI by 8 points (20/24) and FGA by 8 (20/30) points to demonstrate significnat improvement in balance during ambulation.     Time  6    Period  Weeks    Status  New       PT LONG TERM GOAL #3   Title  Patient will perform SLS on bil LE for 30 seconds to demonstrate improved balance to improve quality and safety with giat and functional stair mobility.    Time  6    Period  Weeks    Status  New        Plan - 05/11/18 1720    Clinical Impression Statement  Mr. Overbeck presents to OPPT for evaluation following Lt MCA stroke on 02/16/18. He has had extensive therapy in Acute, CIR, and HH setting and has progressed to Mod I level with functional transfers and gait with RW. His greatest impairments include Rt LE weakness, impaired proprioception of Rt LE, decreased coordination, impaired balance, and testing indicates he is at risk for falls with gait and mobility. He will benefit from skilled PT interventions to address the above limitations and progress independence and safety with functional mobility.     History and Personal Factors relevant to plan of care:  s/p left MCA CVA on 02/16/2018 resulting in right hemiparesis and coordination deficits. Pt was at [facility] CIR from 6/5-6/19 then discharged home and received [facility] services    Clinical Presentation  Stable    Clinical Presentation due to:  MMT, FGA, DGI, SLS, 2MWT, clinical judgement    Clinical Decision Making  Low    Rehab Potential  Good    PT Frequency  2x / week    PT Duration  6 weeks    PT Treatment/Interventions  ADLs/Self Care Home Management;Aquatic Therapy;Electrical Stimulation;DME Instruction;Gait training;Stair training;Functional mobility training;Therapeutic activities;Therapeutic exercise;Balance training;Neuromuscular re-education;Patient/family education;Manual techniques;Passive range of motion;Taping    PT Next Visit Plan  Review evaluation and goals with patient. Review balance exercises for HEP. Initiate gait trainer for sequencing step pattern. Trial SPC with gait. Perform SLS balance training.    PT Home Exercise Plan  Eval: Tandem and SLS at counter;  Consulted and Agree with Plan of  Care  Patient;Family member/caregiver    Family Member Consulted  patient's mother "Laverta Baltimore"       Patient will benefit from skilled therapeutic intervention in order to improve the following deficits and impairments:  Abnormal gait, Improper body mechanics, Decreased coordination, Decreased mobility, Decreased activity tolerance, Decreased endurance, Decreased strength, Decreased balance, Difficulty walking  Visit Diagnosis: Acute ischemic left MCA stroke (HCC)  Other abnormalities of gait and mobility  Other lack of coordination     Problem List Patient Active Problem List   Diagnosis Date Noted  . Stage 3 chronic kidney disease ([place])   . Hypoglycemia   . Neurosyphilis   . Mixed hyperlipidemia   . Acute ischemic left MCA stroke ([place]) 02/22/2018  . Abnormality of gait following cerebrovascular accident (CVA)   . Type 1 diabetes mellitus with peripheral circulatory complications ([place])   . Essential hypertension   . Dyslipidemia   . Syphilis   . DKA (diabetic ketoacidoses) ([place]) 02/16/2018  . CVA (cerebral vascular accident) ([place]) 02/16/2018  . Acute renal failure (ARF) ([place]) 10/21/2016  . Diabetic ketoacidosis without coma associated with type 1 diabetes mellitus ([place])   . Hyperbilirubinemia   . Hyponatremia 04/15/2016  . DM (diabetes mellitus) type 1, uncontrolled, with ketoacidosis ([place]) 04/15/2016  . Hyperglycemia 04/15/2016  . AKI (acute kidney injury) ([place]) 01/04/2016  . Malignant hypertension 01/04/2016  . Noncompliance with medications 01/04/2016  . Intractable nausea and vomiting 04/01/2015  . Gastroparesis 04/01/2015  . Type 1 diabetes mellitus with complication ([place]) 91/66/0600  . Chronic hypertension   . Nausea with vomiting   . Abscess, gluteal, right 06/21/2014  . Nausea and vomiting 04/08/2013  . Hyperglycemia without ketosis 04/08/2013  . Essential hypertension, benign 04/08/2013  . Hypercalcemia 07/29/2011  . Vomiting 07/28/2011  . Leukocytosis  07/28/2011  . Hypokalemia 07/28/2011  . DKA, type 1 ([place]) 07/27/2011  . Dehydration 07/27/2011  . Smoker 07/27/2011  . Marijuana abuse 07/27/2011    Kipp Brood, PT, DPT Physical Therapist with [facility]  05/11/2018 5:45 PM    [place] [address] [place], [place], [address] Phone: [phone]   Fax:  [phone]  Name: YAZID POP MRN: [id number] Date of Birth: February 18, 1986

## 2018-05-12 ENCOUNTER — Ambulatory Visit (HOSPITAL_COMMUNITY): Payer: Medicare HMO

## 2018-05-12 ENCOUNTER — Encounter (HOSPITAL_COMMUNITY): Payer: Self-pay

## 2018-05-12 DIAGNOSIS — I63512 Cerebral infarction due to unspecified occlusion or stenosis of left middle cerebral artery: Secondary | ICD-10-CM

## 2018-05-12 DIAGNOSIS — R278 Other lack of coordination: Secondary | ICD-10-CM

## 2018-05-12 DIAGNOSIS — R2689 Other abnormalities of gait and mobility: Secondary | ICD-10-CM

## 2018-05-12 NOTE — Patient Instructions (Signed)
Tandem Stance    Right foot in front of left, heel touching toe both feet "straight ahead". Stand on Foot Triangle of Support with both feet. Balance in this position 30 seconds. 3 sets each side Do with left foot in front of right.  Copyright  VHI. All rights reserved.   SINGLE LIMB STANCE    Stance: single leg on floor. Raise leg. Hold 30 seconds. Repeat with other leg. 5 reps per set, 1-2 sets per day, 5 days per week  Copyright  VHI. All rights reserved.

## 2018-05-12 NOTE — Therapy (Signed)
[place], [place], [address] Phone: [phone]   Fax:  [phone]  Physical Therapy Treatment  Patient Details  Name: Nathaniel [name] MRN: [id number] Date of Birth: 05/05/1986 Referring Provider: Venancio Poisson, NP   Encounter Date: 05/12/2018  PT End of Session - 05/12/18 1740    Visit Number  2    Number of Visits  13    Date for PT Re-Evaluation  06/22/18    Authorization Type  [employer] Medicare HMO (no visit limit, no auth required)    Authorization Time Period  05/11/18 - 06/23/18    Authorization - Visit Number  2    Authorization - Number of Visits  10    PT Start Time  8768    PT Stop Time  1820    PT Time Calculation (min)  45 min    Equipment Utilized During Treatment  Gait belt    Activity Tolerance  Patient limited by fatigue;Patient tolerated treatment well    Behavior During Therapy  [facility] for tasks assessed/performed       Past Medical History:  Diagnosis Date  . Diabetes mellitus    lantus/novolog  . Hyperlipidemia   . Hypertension   . Marijuana abuse    occaisionally  . Noncompliance with medication regimen   . Stroke ([place])   . Tobacco abuse    5/day    Past Surgical History:  Procedure Laterality Date  . EYE SURGERY      There were no vitals filed for this visit.  Subjective Assessment - 05/12/18 1739    Subjective  Pt stated he is feeling good today, no reports of pain or recent falls.  Main difficulty with walking and balance.      Patient Stated Goals  to be able to walk without a device    Currently in Pain?  No/denies                       [facility] Adult PT Treatment/Exercise - 05/12/18 0001      Ambulation/Gait   Ambulation Distance (Feet)  226 Feet    Assistive device  Straight cane    Gait Pattern  Step-through pattern;Decreased stride length;Right circumduction;Scissoring;Ataxic;Decreased hip/knee flexion - right;Poor foot clearance - right    Ambulation Surface  Level;Indoor    Gait Comments  cueuing for sequence; slow gait velocity and some NBOS/ataxia with Rt LE      Exercises   Exercises  Knee/Hip      Knee/Hip Exercises: Standing   Hip Flexion  Both;10 reps;Knee bent   marching 5" holds   Hip ADduction  Both;10 reps    Functional Squat  10 reps    SLS  5x          Balance Exercises - 05/12/18 1814      Balance Exercises: Standing   Tandem Stance  Eyes open;Intermittent upper extremity support;3 reps;30 secs   intermittent HHA by counter for HEP   SLS  Eyes open;Intermittent upper extremity support;Solid surface;5 reps    Marching Limitations  10x 5" with HHA        PT Education - 05/12/18 [place]    Education Details  Reviewed goals and copy of eval given to pt.  Established HEP and encouraged pt to complete infront of counter for safety.  Educated proper hand use with SPC and rational     Person(s) Educated  Patient    Methods  Explanation;Demonstration;Handout  Comprehension  Verbalized understanding;Returned demonstration       PT Short Term Goals - 05/11/18 1724      PT SHORT TERM GOAL #1   Title  Patient will be indepndent with HEP, updated PRN, to imrpvoe balance, coordination, and strength to improve independence with functional mobility.     Time  3    Period  Weeks    Status  New    Target Date  06/01/18      PT SHORT TERM GOAL #2   Title  Patient will improve DGI by 4 points and FGA by 4 points to demonstrate significnat improvement in balance during ambulation.     Time  3    Period  Weeks    Status  New      PT SHORT TERM GOAL #3   Title  Patient will improve MMT for all limited groups by 1/2 grade or greater and will improve 5x sit to stand by 6 seconds to demonstrate improved functional stregth and independence with mobilty.    Time  3    Period  Weeks    Status  New        PT Long Term Goals - 05/11/18 1731      PT LONG TERM GOAL #1   Title  Patient will ambulate at 1.0 m/s  during 2MWT with LRAD to improve community acces with ambulation and reduce fall risk based on gati velocity.    Time  6    Period  Weeks    Status  New    Target Date  06/22/18      PT LONG TERM GOAL #2   Title  Patient will improve DGI by 8 points (20/24) and FGA by 8 (20/30) points to demonstrate significnat improvement in balance during ambulation.     Time  6    Period  Weeks    Status  New      PT LONG TERM GOAL #3   Title  Patient will perform SLS on bil LE for 30 seconds to demonstrate improved balance to improve quality and safety with giat and functional stair mobility.    Time  6    Period  Weeks    Status  New            Plan - 05/12/18 1824    Clinical Impression Statement  Reviewed goals and copy of eval given to pt.  Session focus with gait training with SPC and balance activities.  Pt educated on proper sequence with cane as well as proper UE use, pt stated he is Rt handed and had some difficulty wiht placement with Lt UE use sequencing/coordination.  Balance activities for SLS with min A or HHA required for LOB.  Establisted HEP including tandem stance and SLS, pt able to demonstrate and verbalize the exercises given for home.  Pt also reports they have given him squats with HHPT, review form with cueing for mechanics.  EOS pt limited by fatigue, no reoprts of pain through session.      Rehab Potential  Good    PT Frequency  2x / week    PT Duration  6 weeks    PT Treatment/Interventions  ADLs/Self Care Home Management;Aquatic Therapy;Electrical Stimulation;DME Instruction;Gait training;Stair training;Functional mobility training;Therapeutic activities;Therapeutic exercise;Balance training;Neuromuscular re-education;Patient/family education;Manual techniques;Passive range of motion;Taping    PT Next Visit Plan  Review compliancei wth HEP.  Next session initiate gait trainer for sequencing step pattern. Trial SPC with gait. Perform SLS balance  training.    PT Home  Exercise Plan  Eval: Tandem and SLS at counter;        Patient will benefit from skilled therapeutic intervention in order to improve the following deficits and impairments:  Abnormal gait, Improper body mechanics, Decreased coordination, Decreased mobility, Decreased activity tolerance, Decreased endurance, Decreased strength, Decreased balance, Difficulty walking  Visit Diagnosis: Acute ischemic left MCA stroke (HCC)  Other abnormalities of gait and mobility  Other lack of coordination     Problem List Patient Active Problem List   Diagnosis Date Noted  . Stage 3 chronic kidney disease ([place])   . Hypoglycemia   . Neurosyphilis   . Mixed hyperlipidemia   . Acute ischemic left MCA stroke ([place]) 02/22/2018  . Abnormality of gait following cerebrovascular accident (CVA)   . 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 Essential hypertension   . Dyslipidemia   . Syphilis   . DKA (diabetic ketoacidoses) ([place]) 02/16/2018  . CVA (cerebral vascular accident) ([place]) 02/16/2018  . Acute renal failure (ARF) ([place]) 10/21/2016  . Diabetic ketoacidosis without coma associated with type 1 diabetes mellitus ([place])   . Hyperbilirubinemia   . Hyponatremia 04/15/2016  . DM (diabetes mellitus) type 1, uncontrolled, with ketoacidosis ([place]) 04/15/2016  . Hyperglycemia 04/15/2016  . AKI (acute kidney injury) ([place]) 01/04/2016  . Malignant hypertension 01/04/2016  . Noncompliance with medications 01/04/2016  . Intractable nausea and vomiting 04/01/2015  . Gastroparesis 04/01/2015  . Type 1 diabetes mellitus with complication ([place]) 43/15/4008  . Chronic hypertension   . Nausea with vomiting   . Abscess, gluteal, right 06/21/2014  . Nausea and vomiting 04/08/2013  . Hyperglycemia without ketosis 04/08/2013  . Essential hypertension, benign 04/08/2013  . Hypercalcemia 07/29/2011  . Vomiting 07/28/2011  . Leukocytosis 07/28/2011  . Hypokalemia 07/28/2011  . DKA,  type 1 ([place]) 07/27/2011  . Dehydration 07/27/2011  . Smoker 07/27/2011  . Marijuana abuse 07/27/2011   Ihor Austin, [place]; [place]  Aldona Lento 05/12/2018, 6:32 PM  [place] [address] [place], [place], [address] Phone: [phone]   Fax:  [phone]  Name: Nathaniel [name] MRN: [id number] Date of Birth: 21-Apr-1986

## 2018-05-16 ENCOUNTER — Ambulatory Visit (HOSPITAL_COMMUNITY): Payer: Medicare HMO

## 2018-05-16 ENCOUNTER — Encounter (HOSPITAL_COMMUNITY): Payer: Self-pay

## 2018-05-16 DIAGNOSIS — I63512 Cerebral infarction due to unspecified occlusion or stenosis of left middle cerebral artery: Secondary | ICD-10-CM

## 2018-05-16 DIAGNOSIS — R2689 Other abnormalities of gait and mobility: Secondary | ICD-10-CM

## 2018-05-16 DIAGNOSIS — R278 Other lack of coordination: Secondary | ICD-10-CM

## 2018-05-16 NOTE — Therapy (Signed)
[place], [place], [address] Phone: [phone]   Fax:  [phone]  Physical Therapy Treatment  Patient Details  Name: Nathaniel [name] MRN: [id number] Date of Birth: 1986/08/23 Referring Provider: Venancio Poisson, NP   Encounter Date: 05/16/2018  PT End of Session - 05/16/18 1704    Visit Number  3    Number of Visits  13    Date for PT Re-Evaluation  06/22/18    Authorization Type  [employer] Medicare HMO (no visit limit, no auth required)    Authorization Time Period  05/11/18 - 06/23/18    Authorization - Visit Number  3    Authorization - Number of Visits  10    PT Start Time  5681   5' on Nustep, not included in charges   PT Stop Time  1740    PT Time Calculation (min)  50 min    Equipment Utilized During Treatment  Gait belt    Activity Tolerance  Patient limited by fatigue;Patient tolerated treatment well    Behavior During Therapy  [facility] for tasks assessed/performed       Past Medical History:  Diagnosis Date  . Diabetes mellitus    lantus/novolog  . Hyperlipidemia   . Hypertension   . Marijuana abuse    occaisionally  . Noncompliance with medication regimen   . Stroke ([place])   . Tobacco abuse    5/day    Past Surgical History:  Procedure Laterality Date  . EYE SURGERY      There were no vitals filed for this visit.  Subjective Assessment - 05/16/18 1646    Subjective  Pt stated he is feeling good today, no reoprts of pain of recent fall.     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 Pt was at [facility] from 6/5-6/19 then discharged home and received [facility] services    Patient Stated Goals  to be able to walk without a device    Currently in Pain?  No/denies                       [facility] Adult PT Treatment/Exercise - 05/16/18 0001      Ambulation/Gait   Ambulation Distance (Feet)  226 Feet    Assistive device   Straight cane    Gait Pattern  Step-through pattern;Decreased stride length;Right circumduction;Scissoring;Ataxic;Decreased hip/knee flexion - right;Poor foot clearance - right    Ambulation Surface  Level;Indoor    Gait Comments  min cueing for sequence; increased gait velocity though continues to be slow; 3 point then 2 point sequence      Exercises   Exercises  Knee/Hip      Knee/Hip Exercises: Aerobic   Tread Mill  off balance, unable to complete gait training today    Nustep  hill L2 x 5' UE/LE for coordination      Knee/Hip Exercises: Standing   Hip Flexion  Both;10 reps;Knee bent    Hip ADduction  Both;15 reps    Functional Squat  15 reps    SLS  5x          Balance Exercises - 05/16/18 1721      Balance Exercises: Standing   Tandem Stance  Eyes open;Intermittent upper extremity support;3 reps;30 secs    SLS  Eyes open;Solid surface;Intermittent upper extremity support;5 reps   3" max   Sidestepping  2 reps;Limitations  in // bars   Marching Limitations  10x 5" with 1 HHA          PT Short Term Goals - 05/11/18 1724      PT SHORT TERM GOAL #1   Title  Patient will be indepndent with HEP, updated PRN, to imrpvoe balance, coordination, and strength to improve independence with functional mobility.     Time  3    Period  Weeks    Status  New    Target Date  06/01/18      PT SHORT TERM GOAL #2   Title  Patient will improve DGI by 4 points and FGA by 4 points to demonstrate significnat improvement in balance during ambulation.     Time  3    Period  Weeks    Status  New      PT SHORT TERM GOAL #3   Title  Patient will improve MMT for all limited groups by 1/2 grade or greater and will improve 5x sit to stand by 6 seconds to demonstrate improved functional stregth and independence with mobilty.    Time  3    Period  Weeks    Status  New        PT Long Term Goals - 05/11/18 1731      PT LONG TERM GOAL #1   Title  Patient will ambulate at 1.0 m/s  during 2MWT with LRAD to improve community acces with ambulation and reduce fall risk based on gati velocity.    Time  6    Period  Weeks    Status  New    Target Date  06/22/18      PT LONG TERM GOAL #2   Title  Patient will improve DGI by 8 points (20/24) and FGA by 8 (20/30) points to demonstrate significnat improvement in balance during ambulation.     Time  6    Period  Weeks    Status  New      PT LONG TERM GOAL #3   Title  Patient will perform SLS on bil LE for 30 seconds to demonstrate improved balance to improve quality and safety with giat and functional stair mobility.    Time  6    Period  Weeks    Status  New            Plan - 05/16/18 1741    Clinical Impression Statement  Session focus wiht coordination activities to improve sequence and overall gait mechanics.  Gait training with SPC, pt able to recall 3 point sequence with minimal cueing required, continues with slow velocity though increased this session.  Progressed balance activities with sidestepping as well as began Nustep for coordination.  No reports of pain, was limited by fatigue at EOS.      Rehab Potential  Good    PT Frequency  2x / week    PT Duration  6 weeks    PT Next Visit Plan  Next session initiate gait trainer for sequencing step pattern. Trial SPC with gait. Perform SLS balance training.  Add step up next session functional strengthening with SLS.      PT Home Exercise Plan  Eval: Tandem and SLS at counter;        Patient will benefit from skilled therapeutic intervention in order to improve the following deficits and impairments:  Abnormal gait, Improper body mechanics, Decreased coordination, Decreased mobility, Decreased activity tolerance, Decreased endurance, Decreased strength, Decreased balance, Difficulty walking  Visit Diagnosis: Acute ischemic left MCA stroke (HCC)  Other abnormalities of gait and mobility  Other lack of coordination     Problem List Patient Active  Problem List   Diagnosis Date Noted  . Stage 3 chronic kidney disease ([place])   . Hypoglycemia   . Neurosyphilis   . Mixed hyperlipidemia   . Acute ischemic left MCA stroke ([place]) 02/22/2018  . Abnormality of gait following cerebrovascular accident (CVA)   . Type 1 diabetes mellitus with peripheral circulatory complications ([place])   . Essential hypertension   . Dyslipidemia   . Syphilis   . DKA (diabetic ketoacidoses) ([place]) 02/16/2018  . CVA (cerebral vascular accident) ([place]) 02/16/2018  . Acute renal failure (ARF) ([place]) 10/21/2016  . Diabetic ketoacidosis without coma associated with type 1 diabetes mellitus ([place])   . Hyperbilirubinemia   . Hyponatremia 04/15/2016  . DM (diabetes mellitus) type 1, uncontrolled, with ketoacidosis ([place]) 04/15/2016  . Hyperglycemia 04/15/2016  . AKI (acute kidney injury) ([place]) 01/04/2016  . Malignant hypertension 01/04/2016  . Noncompliance with medications 01/04/2016  . Intractable nausea and vomiting 04/01/2015  . Gastroparesis 04/01/2015  . Type 1 diabetes mellitus with complication ([place]) 35/32/9924  . Chronic hypertension   . Nausea with vomiting   . Abscess, gluteal, right 06/21/2014  . Nausea and vomiting 04/08/2013  . Hyperglycemia without ketosis 04/08/2013  . Essential hypertension, benign 04/08/2013  . Hypercalcemia 07/29/2011  . Vomiting 07/28/2011  . Leukocytosis 07/28/2011  . Hypokalemia 07/28/2011  . DKA, type 1 ([place]) 07/27/2011  . Dehydration 07/27/2011  . Smoker 07/27/2011  . Marijuana abuse 07/27/2011   Ihor Austin, [place]; [place]  Aldona Lento 05/16/2018, 5:54 PM  [place] [address] [place], [place], [address] Phone: [phone]   Fax:  [phone]  Name: OVIDE DUSEK MRN: [id number] Date of Birth: [birth year]

## 2018-05-18 ENCOUNTER — Ambulatory Visit (HOSPITAL_COMMUNITY): Payer: Medicare HMO

## 2018-05-18 ENCOUNTER — Encounter (HOSPITAL_COMMUNITY): Payer: Self-pay

## 2018-05-18 DIAGNOSIS — R2689 Other abnormalities of gait and mobility: Secondary | ICD-10-CM

## 2018-05-18 DIAGNOSIS — I63512 Cerebral infarction due to unspecified occlusion or stenosis of left middle cerebral artery: Secondary | ICD-10-CM | POA: Diagnosis not present

## 2018-05-18 DIAGNOSIS — R278 Other lack of coordination: Secondary | ICD-10-CM

## 2018-05-18 NOTE — Therapy (Signed)
[place], [place], [address] Phone: [phone]   Fax:  [phone]  Physical Therapy Treatment  Patient Details  Name: Nathaniel [name] MRN: [id number] Date of Birth: [birth year] Referring Provider: Venancio Poisson, NP   Encounter Date: 05/18/2018  PT End of Session - 05/18/18 1707    Visit Number  4    Number of Visits  13    Date for PT Re-Evaluation  06/22/18    Authorization Type  [employer]na Medicare HMO (no visit limit, no auth required)    Authorization Time Period  05/11/18 - 06/23/18    Authorization - Visit Number  4    Authorization - Number of Visits  10    PT Start Time  7106    PT Stop Time  1743    PT Time Calculation (min)  50 min    Equipment Utilized During Treatment  Gait belt    Activity Tolerance  Patient limited by fatigue;Patient tolerated treatment well    Behavior During Therapy  [facility] for tasks assessed/performed       Past Medical History:  Diagnosis Date  . Diabetes mellitus    lantus/novolog  . Hyperlipidemia   . Hypertension   . Marijuana abuse    occaisionally  . Noncompliance with medication regimen   . Stroke ([place])   . Tobacco abuse    5/day    Past Surgical History:  Procedure Laterality Date  . EYE SURGERY      There were no vitals filed for this visit.  Subjective Assessment - 05/18/18 1653    Subjective  Pt stated he is feeling good today, has been compliant wiht HEP daily    Patient Stated Goals  to be able to walk without a device    Currently in Pain?  No/denies                       [facility] Adult PT Treatment/Exercise - 05/18/18 0001      Ambulation/Gait   Ambulation Distance (Feet)  226 Feet    Assistive device  Straight cane    Gait Pattern  Step-through pattern;Decreased stride length;Right circumduction;Scissoring;Ataxic;Decreased hip/knee flexion - right;Poor foot clearance - right    Ambulation Surface  Level;Indoor    Gait  Comments  improved sequence, continues to have slow velocity and unsteadiness      Exercises   Exercises  Knee/Hip      Knee/Hip Exercises: Aerobic   Tread Mill  Gait trainer height 6\' 2"  at 1.5 x 39min; main cueing for increased Lt LE stride length      Knee/Hip Exercises: Standing   Heel Raises  20 reps    Heel Raises Limitations  toe raises with slope    Forward Step Up  Both;10 reps;Hand Hold: 1;Step Height: 4";Step Height: 6"    Forward Step Up Limitations  increased to 6in with 1 HHA    Functional Squat  15 reps          Balance Exercises - 05/18/18 1747      Balance Exercises: Standing   Standing Eyes Opened  Narrow base of support (BOS);Solid surface   2 min no HHA   Tandem Stance  Eyes open;Foam/compliant surface;3 reps;30 secs    SLS  Eyes open;Solid surface;Intermittent upper extremity support;5 reps   2-3" max BLE   Sidestepping  3 reps;Theraband   RTB in // bars no HHA   Marching Limitations  15x 5"  with 1 HHA          PT Short Term Goals - 05/11/18 1724      PT SHORT TERM GOAL #1   Title  Patient will be indepndent with HEP, updated PRN, to imrpvoe balance, coordination, and strength to improve independence with functional mobility.     Time  3    Period  Weeks    Status  New    Target Date  06/01/18      PT SHORT TERM GOAL #2   Title  Patient will improve DGI by 4 points and FGA by 4 points to demonstrate significnat improvement in balance during ambulation.     Time  3    Period  Weeks    Status  New      PT SHORT TERM GOAL #3   Title  Patient will improve MMT for all limited groups by 1/2 grade or greater and will improve 5x sit to stand by 6 seconds to demonstrate improved functional stregth and independence with mobilty.    Time  3    Period  Weeks    Status  New        PT Long Term Goals - 05/11/18 1731      PT LONG TERM GOAL #1   Title  Patient will ambulate at 1.0 m/s during 2MWT with LRAD to improve community acces with ambulation  and reduce fall risk based on gati velocity.    Time  6    Period  Weeks    Status  New    Target Date  06/22/18      PT LONG TERM GOAL #2   Title  Patient will improve DGI by 8 points (20/24) and FGA by 8 (20/30) points to demonstrate significnat improvement in balance during ambulation.     Time  6    Period  Weeks    Status  New      PT LONG TERM GOAL #3   Title  Patient will perform SLS on bil LE for 30 seconds to demonstrate improved balance to improve quality and safety with giat and functional stair mobility.    Time  6    Period  Weeks    Status  New            Plan - 05/18/18 1750    Clinical Impression Statement  Began session with gait trainer on treadmill with main cueing to increased Lt LE stance phase and stride length.  Pt improving sequence wiht SPC, does continue to ambulate slow velocity and some unsteadiness with min guard.  Continued proximal strengthening and balance activities, improved stability but does require min guard for safety.  Added step ups for functional strengthening and SLS with gait.  No reorts of pain, was limited by fatigue at EOS.      Rehab Potential  Good    PT Frequency  2x / week    PT Duration  6 weeks    PT Treatment/Interventions  ADLs/Self Care Home Management;Aquatic Therapy;Electrical Stimulation;DME Instruction;Gait training;Stair training;Functional mobility training;Therapeutic activities;Therapeutic exercise;Balance training;Neuromuscular re-education;Patient/family education;Manual techniques;Passive range of motion;Taping    PT Next Visit Plan  Continue gait trainer and SPC with gait.  SLS balance training and functional strengthening activities with SLS     PT Home Exercise Plan  Eval: Tandem and SLS at counter;        Patient will benefit from skilled therapeutic intervention in order to improve the following deficits and impairments:  Abnormal  gait, Improper body mechanics, Decreased coordination, Decreased mobility,  Decreased activity tolerance, Decreased endurance, Decreased strength, Decreased balance, Difficulty walking  Visit Diagnosis: Acute ischemic left MCA stroke (HCC)  Other abnormalities of gait and mobility  Other lack of coordination     Problem List Patient Active Problem List   Diagnosis Date Noted  . Stage 3 chronic kidney disease ([place])   . Hypoglycemia   . Neurosyphilis   . Mixed hyperlipidemia   . Acute ischemic left MCA stroke ([place]) 02/22/2018  . Abnormality of gait following cerebrovascular accident (CVA)   . 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 Essential hypertension   . Dyslipidemia   . Syphilis   . DKA (diabetic ketoacidoses) ([place]) 02/16/2018  . CVA (cerebral vascular accident) ([place]) 02/16/2018  . Acute renal failure (ARF) ([place]) 10/21/2016  . Diabetic ketoacidosis without coma associated with type 1 diabetes mellitus ([place])   . Hyperbilirubinemia   . Hyponatremia 04/15/2016  . DM (diabetes mellitus) type 1, uncontrolled, with ketoacidosis ([place]) 04/15/2016  . Hyperglycemia 04/15/2016  . AKI (acute kidney injury) ([place]) 01/04/2016  . Malignant hypertension 01/04/2016  . Noncompliance with medications 01/04/2016  . Intractable nausea and vomiting 04/01/2015  . Gastroparesis 04/01/2015  . Type 1 diabetes mellitus with complication ([place]) 67/61/9509  . Chronic hypertension   . Nausea with vomiting   . Abscess, gluteal, right 06/21/2014  . Nausea and vomiting 04/08/2013  . Hyperglycemia without ketosis 04/08/2013  . Essential hypertension, benign 04/08/2013  . Hypercalcemia 07/29/2011  . Vomiting 07/28/2011  . Leukocytosis 07/28/2011  . Hypokalemia 07/28/2011  . DKA, type 1 ([place]) 07/27/2011  . Dehydration 07/27/2011  . Smoker 07/27/2011  . Marijuana abuse 07/27/2011   Ihor Austin, [place]; [place]  Aldona Lento 05/18/2018, 5:56 PM  [place] [address] [place], [place], [address] Phone: [phone]   Fax:  [phone]  Name: Nathaniel [name] MRN: [id number] Date of Birth: 1986/03/07

## 2018-05-23 ENCOUNTER — Ambulatory Visit (HOSPITAL_COMMUNITY): Payer: Medicare HMO | Attending: Adult Health

## 2018-05-23 ENCOUNTER — Encounter (HOSPITAL_COMMUNITY): Payer: Self-pay

## 2018-05-23 DIAGNOSIS — I63512 Cerebral infarction due to unspecified occlusion or stenosis of left middle cerebral artery: Secondary | ICD-10-CM

## 2018-05-23 DIAGNOSIS — R2689 Other abnormalities of gait and mobility: Secondary | ICD-10-CM | POA: Diagnosis present

## 2018-05-23 DIAGNOSIS — R278 Other lack of coordination: Secondary | ICD-10-CM | POA: Diagnosis present

## 2018-05-23 NOTE — Therapy (Signed)
[place], [place], [address] Phone: [phone]   Fax:  [phone]  Physical Therapy Treatment  Patient Details  Name: Nathaniel [name] MRN: [id number] Date of Birth: [birth year] Referring Provider: Venancio Poisson, NP   Encounter Date: 05/23/2018  PT End of Session - 05/23/18 1535    Visit Number  5    Number of Visits  13    Date for PT Re-Evaluation  06/22/18    Authorization Type  [employer]na Medicare HMO (no visit limit, no auth required)    Authorization Time Period  05/11/18 - 06/23/18    Authorization - Visit Number  5    Authorization - Number of Visits  10    PT Start Time  1519    PT Stop Time  1600    PT Time Calculation (min)  41 min    Equipment Utilized During Treatment  Gait belt    Activity Tolerance  Patient limited by fatigue;Patient tolerated treatment well    Behavior During Therapy  [facility] for tasks assessed/performed       Past Medical History:  Diagnosis Date  . Diabetes mellitus    lantus/novolog  . Hyperlipidemia   . Hypertension   . Marijuana abuse    occaisionally  . Noncompliance with medication regimen   . Stroke ([place])   . Tobacco abuse    5/day    Past Surgical History:  Procedure Laterality Date  . EYE SURGERY      There were no vitals filed for this visit.  Subjective Assessment - 05/23/18 1534    Subjective  Pt arrived wiht SPC in hand, stated he practiced walking a lot over weekend.  No reoprts of pain today.  Did report his sugar was low this morning, was shaky, has eaten and feels better this afternoon.    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 Pt was at [facility] from 6/5-6/19 then discharged home and received [facility] services    Patient Stated Goals  to be able to walk without a device    Currently in Pain?  No/denies                       [facility] Adult PT Treatment/Exercise - 05/23/18  0001      Ambulation/Gait   Ambulation Distance (Feet)  226 Feet    Assistive device  Straight cane    Gait Pattern  Step-through pattern;Decreased stride length;Right circumduction;Scissoring;Ataxic;Decreased hip/knee flexion - right;Poor foot clearance - right    Ambulation Surface  Level;Indoor      Exercises   Exercises  Knee/Hip      Knee/Hip Exercises: Standing   Heel Raises  20 reps    Heel Raises Limitations  toe raises with slope    Forward Lunges  Both;10 reps    Forward Step Up Limitations  resume next session with 6in    Functional Squat  15 reps    SLS  5x    Gait Training  assessed gait mechanics 2RT 7in step height meet to pattern 1 HR required          Balance Exercises - 05/23/18 1603      Balance Exercises: Standing   Tandem Stance  Eyes open;Foam/compliant surface;3 reps;30 secs    SLS  Eyes open;Solid surface;Intermittent upper extremity support;5 reps   Lt 7", Rt 5" max   Sidestepping  2 reps  down blue line   Marching Limitations  15x 5" with 1 HHA          PT Short Term Goals - 05/11/18 1724      PT SHORT TERM GOAL #1   Title  Patient will be indepndent with HEP, updated PRN, to imrpvoe balance, coordination, and strength to improve independence with functional mobility.     Time  3    Period  Weeks    Status  New    Target Date  06/01/18      PT SHORT TERM GOAL #2   Title  Patient will improve DGI by 4 points and FGA by 4 points to demonstrate significnat improvement in balance during ambulation.     Time  3    Period  Weeks    Status  New      PT SHORT TERM GOAL #3   Title  Patient will improve MMT for all limited groups by 1/2 grade or greater and will improve 5x sit to stand by 6 seconds to demonstrate improved functional stregth and independence with mobilty.    Time  3    Period  Weeks    Status  New        PT Long Term Goals - 05/11/18 1731      PT LONG TERM GOAL #1   Title  Patient will ambulate at 1.0 m/s during 2MWT  with LRAD to improve community acces with ambulation and reduce fall risk based on gati velocity.    Time  6    Period  Weeks    Status  New    Target Date  06/22/18      PT LONG TERM GOAL #2   Title  Patient will improve DGI by 8 points (20/24) and FGA by 8 (20/30) points to demonstrate significnat improvement in balance during ambulation.     Time  6    Period  Weeks    Status  New      PT LONG TERM GOAL #3   Title  Patient will perform SLS on bil LE for 30 seconds to demonstrate improved balance to improve quality and safety with giat and functional stair mobility.    Time  6    Period  Weeks    Status  New            Plan - 05/23/18 1538    Clinical Impression Statement  Pt arrived with [facility], presents with improved 2 point sequence and increased velocity, does continue to present wiht some unsteadiness though no LOB during gait.  Continued session focus with balance activities and funcitonal strengthening.  Pt continues to have difficulty with SLS activities, able to reduce HHA wiht some activities this session.  Added lunges for strengthening with cueing for mechanics.  No reports of pain through session, visible muscle fatigue noted at EOS.  Progressed to sidestepping out of //bars with min A, pt unsteady though no LOB.    Rehab Potential  Good    PT Frequency  2x / week    PT Duration  6 weeks    PT Treatment/Interventions  ADLs/Self Care Home Management;Aquatic Therapy;Electrical Stimulation;DME Instruction;Gait training;Stair training;Functional mobility training;Therapeutic activities;Therapeutic exercise;Balance training;Neuromuscular re-education;Patient/family education;Manual techniques;Passive range of motion;Taping    PT Next Visit Plan  Continue gait trainer and SPC with gait.  SLS balance training and functional strengthening activities with SLS     PT Home Exercise Plan  Eval: Tandem and SLS at counter;  Patient will benefit from skilled therapeutic  intervention in order to improve the following deficits and impairments:  Abnormal gait, Improper body mechanics, Decreased coordination, Decreased mobility, Decreased activity tolerance, Decreased endurance, Decreased strength, Decreased balance, Difficulty walking  Visit Diagnosis: Acute ischemic left MCA stroke (HCC)  Other abnormalities of gait and mobility  Other lack of coordination     Problem List Patient Active Problem List   Diagnosis Date Noted  . Stage 3 chronic kidney disease ([place])   . Hypoglycemia   . Neurosyphilis   . Mixed hyperlipidemia   . Acute ischemic left MCA stroke ([place]) 02/22/2018  . Abnormality of gait following cerebrovascular accident (CVA)   . 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 Essential hypertension   . Dyslipidemia   . Syphilis   . DKA (diabetic ketoacidoses) ([place]) 02/16/2018  . CVA (cerebral vascular accident) ([place]) 02/16/2018  . Acute renal failure (ARF) ([place]) 10/21/2016  . Diabetic ketoacidosis without coma associated with type 1 diabetes mellitus ([place])   . Hyperbilirubinemia   . Hyponatremia 04/15/2016  . DM (diabetes mellitus) type 1, uncontrolled, with ketoacidosis ([place]) 04/15/2016  . Hyperglycemia 04/15/2016  . AKI (acute kidney injury) ([place]) 01/04/2016  . Malignant hypertension 01/04/2016  . Noncompliance with medications 01/04/2016  . Intractable nausea and vomiting 04/01/2015  . Gastroparesis 04/01/2015  . Type 1 diabetes mellitus with complication ([place]) 34/91/7915  . Chronic hypertension   . Nausea with vomiting   . Abscess, gluteal, right 06/21/2014  . Nausea and vomiting 04/08/2013  . Hyperglycemia without ketosis 04/08/2013  . Essential hypertension, benign 04/08/2013  . Hypercalcemia 07/29/2011  . Vomiting 07/28/2011  . Leukocytosis 07/28/2011  . Hypokalemia 07/28/2011  . DKA, type 1 ([place]) 07/27/2011  . Dehydration 07/27/2011  . Smoker 07/27/2011  . Marijuana abuse 07/27/2011    Ihor Austin, [place]; [place]  Aldona Lento 05/23/2018, 4:32 PM  [place] [address] [place], [place], [address] Phone: [phone]   Fax:  [phone]  Name: Nathaniel [name] MRN: [id number] Date of Birth: [birth year]

## 2018-05-25 ENCOUNTER — Ambulatory Visit (HOSPITAL_COMMUNITY): Payer: Medicare HMO | Admitting: Physical Therapy

## 2018-05-25 DIAGNOSIS — R2689 Other abnormalities of gait and mobility: Secondary | ICD-10-CM

## 2018-05-25 DIAGNOSIS — R278 Other lack of coordination: Secondary | ICD-10-CM

## 2018-05-25 DIAGNOSIS — I63512 Cerebral infarction due to unspecified occlusion or stenosis of left middle cerebral artery: Secondary | ICD-10-CM | POA: Diagnosis not present

## 2018-05-25 NOTE — Therapy (Signed)
[place], [place], [address] Phone: [phone]   Fax:  [phone]  Physical Therapy Treatment  Patient Details  Name: Nathaniel [name] MRN: [id number] Date of Birth: 26-Jun-1986 Referring Provider: Venancio Poisson, NP   Encounter Date: 05/25/2018  PT End of Session - 05/25/18 1644    Visit Number  6    Number of Visits  13    Date for PT Re-Evaluation  06/22/18    Authorization Type  [employer] Medicare HMO (no visit limit, no auth required)    Authorization Time Period  05/11/18 - 06/23/18    Authorization - Visit Number  6    Authorization - Number of Visits  10    PT Start Time  1608    PT Stop Time  1648    PT Time Calculation (min)  40 min    Equipment Utilized During Treatment  Gait belt    Activity Tolerance  Patient limited by fatigue;Patient tolerated treatment well    Behavior During Therapy  [facility] for tasks assessed/performed       Past Medical History:  Diagnosis Date  . Diabetes mellitus    lantus/novolog  . Hyperlipidemia   . Hypertension   . Marijuana abuse    occaisionally  . Noncompliance with medication regimen   . Stroke ([place])   . Tobacco abuse    5/day    Past Surgical History:  Procedure Laterality Date  . EYE SURGERY      There were no vitals filed for this visit.  Subjective Assessment - 05/25/18 1643    Subjective  Pt states he is doing well today and having no issues.  Continues to do his exercises.      Currently in Pain?  No/denies                       [facility] Adult PT Treatment/Exercise - 05/25/18 0001      Ambulation/Gait   Ambulation Distance (Feet)  226 Feet    Assistive device  Straight cane    Gait Pattern  Step-through pattern;Decreased stride length;Right circumduction;Scissoring;Ataxic;Decreased hip/knee flexion - right;Poor foot clearance - right      Knee/Hip Exercises: Standing   Heel Raises  20 reps    Heel Raises Limitations  toe  raises with slope    Hip Flexion  Both;15 reps    Forward Lunges  Both;10 reps    Hip ADduction  --    Hip Abduction  Both;15 reps    Forward Step Up  Both;10 reps;Hand Hold: 1;Step Height: 6"    Forward Step Up Limitations  in bars with 1 UE assist    Functional Squat  15 reps    SLS  5x   max of 4"   Gait Training  vectors 1 HHA with 3" holds 10 reps on each LE    Other Standing Knee Exercises  side stepping in bars no UE assist               PT Short Term Goals - 05/11/18 1724      PT SHORT TERM GOAL #1   Title  Patient will be indepndent with HEP, updated PRN, to imrpvoe balance, coordination, and strength to improve independence with functional mobility.     Time  3    Period  Weeks    Status  New    Target Date  06/01/18      PT SHORT TERM  GOAL #2   Title  Patient will improve DGI by 4 points and FGA by 4 points to demonstrate significnat improvement in balance during ambulation.     Time  3    Period  Weeks    Status  New      PT SHORT TERM GOAL #3   Title  Patient will improve MMT for all limited groups by 1/2 grade or greater and will improve 5x sit to stand by 6 seconds to demonstrate improved functional stregth and independence with mobilty.    Time  3    Period  Weeks    Status  New        PT Long Term Goals - 05/11/18 1731      PT LONG TERM GOAL #1   Title  Patient will ambulate at 1.0 m/s during 2MWT with LRAD to improve community acces with ambulation and reduce fall risk based on gati velocity.    Time  6    Period  Weeks    Status  New    Target Date  06/22/18      PT LONG TERM GOAL #2   Title  Patient will improve DGI by 8 points (20/24) and FGA by 8 (20/30) points to demonstrate significnat improvement in balance during ambulation.     Time  6    Period  Weeks    Status  New      PT LONG TERM GOAL #3   Title  Patient will perform SLS on bil LE for 30 seconds to demonstrate improved balance to improve quality and safety with giat and  functional stair mobility.    Time  6    Period  Weeks    Status  New            Plan - 05/25/18 1644    Clinical Impression Statement  continued focus on improving LE strength and functional independence.  Added vectors today to help improve SLS stabiliy and began side stepping in parallel bars with min use of UE's.  Lunges with cues for form due to tight ankles and tendency to rise up on toes.  Noted fatigue during session, however denied need for rest break. Overall doing well and able to self correct LOB that incurs.    Rehab Potential  Good    PT Frequency  2x / week    PT Duration  6 weeks    PT Treatment/Interventions  ADLs/Self Care Home Management;Aquatic Therapy;Electrical Stimulation;DME Instruction;Gait training;Stair training;Functional mobility training;Therapeutic activities;Therapeutic exercise;Balance training;Neuromuscular re-education;Patient/family education;Manual techniques;Passive range of motion;Taping    PT Next Visit Plan  Continue gait trainer and SPC with gait.  SLS balance training and functional strengthening activities with SLS     PT Home Exercise Plan  Eval: Tandem and SLS at counter;        Patient will benefit from skilled therapeutic intervention in order to improve the following deficits and impairments:  Abnormal gait, Improper body mechanics, Decreased coordination, Decreased mobility, Decreased activity tolerance, Decreased endurance, Decreased strength, Decreased balance, Difficulty walking  Visit Diagnosis: Acute ischemic left MCA stroke (HCC)  Other abnormalities of gait and mobility  Other lack of coordination     Problem List Patient Active Problem List   Diagnosis Date Noted  . Stage 3 chronic kidney disease ([place])   . Hypoglycemia   . Neurosyphilis   . Mixed hyperlipidemia   . Acute ischemic left MCA stroke ([place]) 02/22/2018  . Abnormality of gait following cerebrovascular accident (CVA)   .  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)   . Essential hypertension   . Dyslipidemia   . Syphilis   . DKA (diabetic ketoacidoses) ([place]) 02/16/2018  . CVA (cerebral vascular accident) ([place]) 02/16/2018  . Acute renal failure (ARF) ([place]) 10/21/2016  . Diabetic ketoacidosis without coma associated with type 1 diabetes mellitus ([place])   . Hyperbilirubinemia   . Hyponatremia 04/15/2016  . DM (diabetes mellitus) type 1, uncontrolled, with ketoacidosis ([place]) 04/15/2016  . Hyperglycemia 04/15/2016  . AKI (acute kidney injury) ([place]) 01/04/2016  . Malignant hypertension 01/04/2016  . Noncompliance with medications 01/04/2016  . Intractable nausea and vomiting 04/01/2015  . Gastroparesis 04/01/2015  . Type 1 diabetes mellitus with complication ([place]) 49/82/6415  . Chronic hypertension   . Nausea with vomiting   . Abscess, gluteal, right 06/21/2014  . Nausea and vomiting 04/08/2013  . Hyperglycemia without ketosis 04/08/2013  . Essential hypertension, benign 04/08/2013  . Hypercalcemia 07/29/2011  . Vomiting 07/28/2011  . Leukocytosis 07/28/2011  . Hypokalemia 07/28/2011  . DKA, type 1 ([place]) 07/27/2011  . Dehydration 07/27/2011  . Smoker 07/27/2011  . Marijuana abuse 07/27/2011   Teena Irani, PTA/CLT [phone]  Teena Irani 05/25/2018, 4:52 PM  [place] [address] [place], [place], [address] Phone: [phone]   Fax:  [phone]  Name: LOUAY MYRIE MRN: [id number] Date of Birth: 1986/07/07

## 2018-05-30 ENCOUNTER — Encounter (HOSPITAL_COMMUNITY): Payer: Self-pay | Admitting: Physical Therapy

## 2018-05-30 ENCOUNTER — Ambulatory Visit (HOSPITAL_COMMUNITY): Payer: Medicare HMO | Admitting: Physical Therapy

## 2018-05-30 DIAGNOSIS — R278 Other lack of coordination: Secondary | ICD-10-CM

## 2018-05-30 DIAGNOSIS — I63512 Cerebral infarction due to unspecified occlusion or stenosis of left middle cerebral artery: Secondary | ICD-10-CM | POA: Diagnosis not present

## 2018-05-30 DIAGNOSIS — R2689 Other abnormalities of gait and mobility: Secondary | ICD-10-CM

## 2018-05-30 NOTE — Therapy (Signed)
[place] [name], [place], [address] Phone: [phone]   Fax:  [phone]  Physical Therapy Treatment  Patient Details  Name: Nathaniel [name] MRN: [id number] Date of Birth: [birth year] Referring Provider: Venancio Poisson, NP   Encounter Date: 05/30/2018  PT End of Session - 05/30/18 1445    Visit Number  7    Number of Visits  13    Date for PT Re-Evaluation  06/22/18    Authorization Type  [employer] Medicare HMO (no visit limit, no auth required)    Authorization Time Period  05/11/18 - 06/23/18    Authorization - Visit Number  7    Authorization - Number of Visits  10    PT Start Time  8841    PT Stop Time  1516    PT Time Calculation (min)  40 min    Equipment Utilized During Treatment  Gait belt    Activity Tolerance  Patient tolerated treatment well    Behavior During Therapy  [facility] for tasks assessed/performed       Past Medical History:  Diagnosis Date  . Diabetes mellitus    lantus/novolog  . Hyperlipidemia   . Hypertension   . Marijuana abuse    occaisionally  . Noncompliance with medication regimen   . Stroke ([place])   . Tobacco abuse    5/day    Past Surgical History:  Procedure Laterality Date  . EYE SURGERY      There were no vitals filed for this visit.  Subjective Assessment - 05/30/18 1442    Subjective  Patient stated he feels that he has made good progress with therapy. Patient stated he does his exercises at home all the time.     Currently in Pain?  No/denies                       [facility] Adult PT Treatment/Exercise - 05/30/18 0001      Ambulation/Gait   Ambulation Distance (Feet)  226 Feet    Assistive device  Straight cane    Gait Pattern  Step-through pattern;Decreased stride length;Right circumduction;Scissoring;Ataxic;Decreased hip/knee flexion - right;Poor foot clearance - right    Gait Comments  Cues to try and keep feet parallel with walking      Knee/Hip  Exercises: Standing   Heel Raises  20 reps    Heel Raises Limitations  toe raises with slope    Hip Flexion  Both;15 reps    Forward Lunges  Both;10 reps    Forward Step Up  Both;10 reps;Hand Hold: 1;Step Height: 6"    Forward Step Up Limitations  in bars with 1 UE assist    Functional Squat  15 reps   Right lower extremity slightly behind left   Gait Training  vectors 1 HHA with 5" holds 10 reps on each LE    Other Standing Knee Exercises  side stepping in bars no UE assist  x4 roundtrips          Balance Exercises - 05/30/18 1506      Balance Exercises: Standing   Tandem Stance  Eyes open;Foam/compliant surface;3 reps;30 secs   inside parallel bars   Sidestepping  3 reps;Limitations   Sidestepping 3 Roundtrips over 3 6-inch hurdles         PT Short Term Goals - 05/11/18 1724      PT SHORT TERM GOAL #1   Title  Patient will be indepndent with HEP,  updated PRN, to imrpvoe balance, coordination, and strength to improve independence with functional mobility.     Time  3    Period  Weeks    Status  New    Target Date  06/01/18      PT SHORT TERM GOAL #2   Title  Patient will improve DGI by 4 points and FGA by 4 points to demonstrate significnat improvement in balance during ambulation.     Time  3    Period  Weeks    Status  New      PT SHORT TERM GOAL #3   Title  Patient will improve MMT for all limited groups by 1/2 grade or greater and will improve 5x sit to stand by 6 seconds to demonstrate improved functional stregth and independence with mobilty.    Time  3    Period  Weeks    Status  New        PT Long Term Goals - 05/11/18 1731      PT LONG TERM GOAL #1   Title  Patient will ambulate at 1.0 m/s during 2MWT with LRAD to improve community acces with ambulation and reduce fall risk based on gati velocity.    Time  6    Period  Weeks    Status  New    Target Date  06/22/18      PT LONG TERM GOAL #2   Title  Patient will improve DGI by 8 points (20/24)  and FGA by 8 (20/30) points to demonstrate significnat improvement in balance during ambulation.     Time  6    Period  Weeks    Status  New      PT LONG TERM GOAL #3   Title  Patient will perform SLS on bil LE for 30 seconds to demonstrate improved balance to improve quality and safety with giat and functional stair mobility.    Time  6    Period  Weeks    Status  New            Plan - 05/30/18 1535    Clinical Impression Statement  This session continued with established plan of care. Progressed patient further with functional squats by having patient perform squats with the right lower extremity behind the left to work the right side more. In addition, progressed side stepping inside of parallel bars with patient stepping over 6'' hurdles. Patient required intermittent upper extremity assistance to perform sidestepping over hurdles. Plan for re-assessment at next session.     Rehab Potential  Good    PT Frequency  2x / week    PT Duration  6 weeks    PT Treatment/Interventions  ADLs/Self Care Home Management;Aquatic Therapy;Electrical Stimulation;DME Instruction;Gait training;Stair training;Functional mobility training;Therapeutic activities;Therapeutic exercise;Balance training;Neuromuscular re-education;Patient/family education;Manual techniques;Passive range of motion;Taping    PT Next Visit Plan  Re-assess next session. Continue gait trainer and SPC with gait.  SLS balance training and functional strengthening activities with SLS     PT Home Exercise Plan  Eval: Tandem and SLS at counter;        Patient will benefit from skilled therapeutic intervention in order to improve the following deficits and impairments:  Abnormal gait, Improper body mechanics, Decreased coordination, Decreased mobility, Decreased activity tolerance, Decreased endurance, Decreased strength, Decreased balance, Difficulty walking  Visit Diagnosis: Acute ischemic left MCA stroke (HCC)  Other  abnormalities of gait and mobility  Other lack of coordination     Problem List  Patient Active Problem List   Diagnosis Date Noted  . Stage 3 chronic kidney disease ([place])   . Hypoglycemia   . Neurosyphilis   . Mixed hyperlipidemia   . Acute ischemic left MCA stroke ([place]) 02/22/2018  . Abnormality of gait following cerebrovascular accident (CVA)   . Type 1 diabetes mellitus with peripheral circulatory complications ([place])   . Essential hypertension   . Dyslipidemia   . Syphilis   . DKA (diabetic ketoacidoses) ([place]) 02/16/2018  . CVA (cerebral vascular accident) ([place]) 02/16/2018  . Acute renal failure (ARF) ([place]) 10/21/2016  . Diabetic ketoacidosis without coma associated with type 1 diabetes mellitus ([place])   . Hyperbilirubinemia   . Hyponatremia 04/15/2016  . DM (diabetes mellitus) type 1, uncontrolled, with ketoacidosis ([place]) 04/15/2016  . Hyperglycemia 04/15/2016  . AKI (acute kidney injury) ([place]) 01/04/2016  . Malignant hypertension 01/04/2016  . Noncompliance with medications 01/04/2016  . Intractable nausea and vomiting 04/01/2015  . Gastroparesis 04/01/2015  . Type 1 diabetes mellitus with complication ([place]) 02/40/9735  . Chronic hypertension   . Nausea with vomiting   . Abscess, gluteal, right 06/21/2014  . Nausea and vomiting 04/08/2013  . Hyperglycemia without ketosis 04/08/2013  . Essential hypertension, benign 04/08/2013  . Hypercalcemia 07/29/2011  . Vomiting 07/28/2011  . Leukocytosis 07/28/2011  . Hypokalemia 07/28/2011  . DKA, type 1 ([place]) 07/27/2011  . Dehydration 07/27/2011  . Smoker 07/27/2011  . Marijuana abuse 07/27/2011   Clarene Critchley PT, DPT 3:40 PM, 05/30/18 [place], [place], [address] Phone: [phone]   Fax:  [phone]  Name: HENDRYX RICKE MRN: [id number] Date of Birth: 04/02/1986

## 2018-06-01 ENCOUNTER — Ambulatory Visit (HOSPITAL_COMMUNITY): Payer: Medicare HMO

## 2018-06-01 ENCOUNTER — Encounter (HOSPITAL_COMMUNITY): Payer: Self-pay

## 2018-06-01 DIAGNOSIS — I63512 Cerebral infarction due to unspecified occlusion or stenosis of left middle cerebral artery: Secondary | ICD-10-CM

## 2018-06-01 DIAGNOSIS — R278 Other lack of coordination: Secondary | ICD-10-CM

## 2018-06-01 DIAGNOSIS — R2689 Other abnormalities of gait and mobility: Secondary | ICD-10-CM

## 2018-06-01 NOTE — Therapy (Signed)
[place], [place], [address] Phone: [phone]   Fax:  [phone]   Progress Note Reporting Period 05/11/18 to 06/01/18  See note below for Objective Data and Assessment of Progress/Goals.    Physical Therapy Treatment  Patient Details  Name: Nathaniel [name] MRN: [id number] Date of Birth: 24-Dec-1985 Referring Provider: Venancio Poisson, NP   Encounter Date: 06/01/2018  PT End of Session - 06/01/18 1517    Visit Number  8    Number of Visits  13   mini reassess complete 06/01/18   Date for PT Re-Evaluation  06/22/18    Authorization Type  [employer] Medicare HMO (no visit limit, no auth required)    Authorization Time Period  05/11/18 - 06/23/18    Authorization - Visit Number  0    Authorization - Number of Visits  10    PT Start Time  7989    PT Stop Time  1557    PT Time Calculation (min)  40 min    Equipment Utilized During Treatment  Gait belt    Activity Tolerance  Patient tolerated treatment well    Behavior During Therapy  WFL for tasks assessed/performed       Past Medical History:  Diagnosis Date  . Diabetes mellitus    lantus/novolog  . Hyperlipidemia   . Hypertension   . Marijuana abuse    occaisionally  . Noncompliance with medication regimen   . Stroke ([place])   . Tobacco abuse    5/day    Past Surgical History:  Procedure Laterality Date  . EYE SURGERY      There were no vitals filed for this visit.  Subjective Assessment - 06/01/18 1518    Subjective  Pt states that he's doing well. He feels his walking has improved.     Currently in Pain?  No/denies         [facility] PT Assessment - 06/01/18 0001      Assessment   Medical Diagnosis  S/P left MCA stroke     Referring Provider  Venancio Poisson, NP    Onset Date/Surgical Date  02/16/18    Prior Therapy  CIR at [facility], [facility]      Functional Tests   Functional tests  Single leg stance      Single Leg Stance   Comments   R: 7sec L: 5 sec   was 4 sec R, 5 sec L     Strength   Right Hip Flexion  5/5   was 4   Right Hip Extension  4/5   was4   Right Hip ABduction  4+/5   was 4   Left Hip Flexion  5/5   was 4+   Left Hip Extension  4+/5   was 4+   Left Hip ABduction  4+/5   was 4+   Right Knee Flexion  4+/5   was 4   Right Knee Extension  5/5   was 4   Left Knee Flexion  4+/5   was 4+   Right Ankle Dorsiflexion  4+/5   was 4   Left Ankle Dorsiflexion  5/5   was 4+     Transfers   Five time sit to stand comments   19.5sec, no UE   was 28.7sec without UE     Ambulation/Gait   Ambulation Distance (Feet)  270 Feet   2MWT; was 253f with RW   Assistive device  Straight cane  Gait Pattern  Step-through pattern;Decreased stride length;Right circumduction;Scissoring;Ataxic;Decreased hip/knee flexion - right;Poor foot clearance - right    Gait velocity  0.35ms   wa 0.588m     Standardized Balance Assessment   Standardized Balance Assessment  Dynamic Gait Index      Dynamic Gait Index   Level Surface  Mild Impairment    Change in Gait Speed  Mild Impairment    Gait with Horizontal Head Turns  Mild Impairment    Gait with Vertical Head Turns  Moderate Impairment    Gait and Pivot Turn  Moderate Impairment    Step Over Obstacle  Moderate Impairment    Step Around Obstacles  Mild Impairment    Steps  Mild Impairment    Total Score  13    DGI comment:  was 12/24      Functional Gait  Assessment   Gait assessed   Yes    Gait Level Surface  Walks 20 ft, slow speed, abnormal gait pattern, evidence for imbalance or deviates 10-15 in outside of the 12 in walkway width. Requires more than 7 sec to ambulate 20 ft.    Change in Gait Speed  Able to change speed, demonstrates mild gait deviations, deviates 6-10 in outside of the 12 in walkway width, or no gait deviations, unable to achieve a major change in velocity, or uses a change in velocity, or uses an assistive device.    Gait with Horizontal  Head Turns  Performs head turns smoothly with slight change in gait velocity (eg, minor disruption to smooth gait path), deviates 6-10 in outside 12 in walkway width, or uses an assistive device.    Gait with Vertical Head Turns  Performs task with moderate change in gait velocity, slows down, deviates 10-15 in outside 12 in walkway width but recovers, can continue to walk.    Gait and Pivot Turn  Turns slowly, requires verbal cueing, or requires several small steps to catch balance following turn and stop    Step Over Obstacle  Is able to step over one shoe box (4.5 in total height) but must slow down and adjust steps to clear box safely. May require verbal cueing.    Gait with Narrow Base of Support  Ambulates 4-7 steps.    Gait with Eyes Closed  Walks 20 ft, slow speed, abnormal gait pattern, evidence for imbalance, deviates 10-15 in outside 12 in walkway width. Requires more than 9 sec to ambulate 20 ft.    Ambulating Backwards  Walks 20 ft, uses assistive device, slower speed, mild gait deviations, deviates 6-10 in outside 12 in walkway width.    Steps  Alternating feet, must use rail.    Total Score  14    FGA comment:  was 12/30             PT Education - 06/01/18 1611    Education Details  reassessment findings    Person(s) Educated  Patient    Methods  Explanation    Comprehension  Verbalized understanding         PT Short Term Goals - 06/01/18 1520      PT SHORT TERM GOAL #1   Title  Patient will be indepndent with HEP, updated PRN, to imrpvoe balance, coordination, and strength to improve independence with functional mobility.     Time  3    Period  Weeks    Status  Achieved      PT SHORT TERM GOAL #2   Title  Patient will improve DGI by 4 points and FGA by 4 points to demonstrate significnat improvement in balance during ambulation.     Baseline  9/12: DGI improved by 1 point, FGA by 2 points    Time  3    Period  Weeks    Status  On-going      PT SHORT TERM  GOAL #3   Title  Patient will improve MMT for all limited groups by 1/2 grade or greater and will improve 5x sit to stand by 6 seconds to demonstrate improved functional stregth and independence with mobilty.    Baseline  9/12: see MMT, 5xSTS improved by 9 sec    Time  3    Period  Weeks    Status  Partially Met        PT Long Term Goals - 06/01/18 1521      PT LONG TERM GOAL #1   Title  Patient will ambulate at 1.0 m/s during 2MWT with LRAD to improve community acces with ambulation and reduce fall risk based on gati velocity.    Baseline  9/12: 239f, SPC, at 0.680m    Time  6    Period  Weeks    Status  On-going      PT LONG TERM GOAL #2   Title  Patient will improve DGI by 8 points (20/24) and FGA by 8 (20/30) points to demonstrate significnat improvement in balance during ambulation.     Baseline  9/12: DGI improved by 1 point, FGA by 2 points    Time  6    Period  Weeks    Status  On-going      PT LONG TERM GOAL #3   Title  Patient will perform SLS on bil LE for 30 seconds to demonstrate improved balance to improve quality and safety with giat and functional stair mobility.    Baseline  9/12: R: 7 sec, L: 5 sec    Time  6    Period  Weeks    Status  On-going            Plan - 06/01/18 1603    Clinical Impression Statement  PT reassessed pt's goals and outcome measures this date. Pt has made progress towards all goals as illustrated above. His overall MMT has improved and his 5xSTS improved by 9sec. His overall walking speed has improved though is still deficient along with gait deviations that remain. His balance is still his main limitation as his static balance slightly improved on the R and remained unchanged on the L and his dynamic balance made minimal progress AEB DGI and FGA improvements; his DGI improved by 1 point (from 12/24 to 13/24) and his FGA improved by 2 points (from 12/30 to 14/30), indicating he is still at risk for falls during ambulation. Pt did  have 1 moderate LOB during FGA, however, PT had positioned pt next to the wall for safety during the tandem gait portion of the test and he was able to use the wall to stabilize himself. Overall, pt is progressing but needs continued skilled PT intervention to address continued limitations in order to maximize his balance and decrease his risk for falls.     Rehab Potential  Good    PT Frequency  2x / week    PT Duration  6 weeks    PT Treatment/Interventions  ADLs/Self Care Home Management;Aquatic Therapy;Electrical Stimulation;DME Instruction;Gait training;Stair training;Functional mobility training;Therapeutic activities;Therapeutic exercise;Balance training;Neuromuscular re-education;Patient/family education;Manual techniques;Passive range of  motion;Taping    PT Next Visit Plan   Continue gait trainer and SPC with gait. continue dynamic balance activities, SLS balance training and functional strengthening activities with SLS     PT Home Exercise Plan  Eval: Tandem and SLS at counter;     Consulted and Agree with Plan of Care  Patient       Patient will benefit from skilled therapeutic intervention in order to improve the following deficits and impairments:  Abnormal gait, Improper body mechanics, Decreased coordination, Decreased mobility, Decreased activity tolerance, Decreased endurance, Decreased strength, Decreased balance, Difficulty walking  Visit Diagnosis: Acute ischemic left MCA stroke (HCC)  Other abnormalities of gait and mobility  Other lack of coordination     Problem List Patient Active Problem List   Diagnosis Date Noted  . Stage 3 chronic kidney disease ([place])   . Hypoglycemia   . Neurosyphilis   . Mixed hyperlipidemia   . Acute ischemic left MCA stroke ([place]) 02/22/2018  . Abnormality of gait following cerebrovascular accident (CVA)   . 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 Essential hypertension   . Dyslipidemia   .  Syphilis   . DKA (diabetic ketoacidoses) ([place]) 02/16/2018  . CVA (cerebral vascular accident) ([place]) 02/16/2018  . Acute renal failure (ARF) ([place]) 10/21/2016  . Diabetic ketoacidosis without coma associated with type 1 diabetes mellitus ([place])   . Hyperbilirubinemia   . Hyponatremia 04/15/2016  . DM (diabetes mellitus) type 1, uncontrolled, with ketoacidosis ([place]) 04/15/2016  . Hyperglycemia 04/15/2016  . AKI (acute kidney injury) ([place]) 01/04/2016  . Malignant hypertension 01/04/2016  . Noncompliance with medications 01/04/2016  . Intractable nausea and vomiting 04/01/2015  . Gastroparesis 04/01/2015  . Type 1 diabetes mellitus with complication ([place]) 72/53/6644  . Chronic hypertension   . Nausea with vomiting   . Abscess, gluteal, right 06/21/2014  . Nausea and vomiting 04/08/2013  . Hyperglycemia without ketosis 04/08/2013  . Essential hypertension, benign 04/08/2013  . Hypercalcemia 07/29/2011  . Vomiting 07/28/2011  . Leukocytosis 07/28/2011  . Hypokalemia 07/28/2011  . DKA, type 1 ([place]) 07/27/2011  . Dehydration 07/27/2011  . Smoker 07/27/2011  . Marijuana abuse 07/27/2011       Geraldine Solar PT, DPT  [place] [address] [place], [place], [address] Phone: [phone]   Fax:  [phone]  Name: Nathaniel [name] MRN: [id number] Date of Birth: 1985-11-12

## 2018-06-06 ENCOUNTER — Ambulatory Visit (HOSPITAL_COMMUNITY): Payer: Medicare HMO | Admitting: Physical Therapy

## 2018-06-06 DIAGNOSIS — I63512 Cerebral infarction due to unspecified occlusion or stenosis of left middle cerebral artery: Secondary | ICD-10-CM

## 2018-06-06 DIAGNOSIS — R2689 Other abnormalities of gait and mobility: Secondary | ICD-10-CM

## 2018-06-06 DIAGNOSIS — R278 Other lack of coordination: Secondary | ICD-10-CM

## 2018-06-06 NOTE — Therapy (Signed)
[place], [place], [address] Phone: [phone]   Fax:  [phone]  Physical Therapy Treatment  Patient Details  Name: Nathaniel [name] MRN: [id number] Date of Birth: [birth year] Referring Provider: Venancio Poisson, NP   Encounter Date: 06/06/2018  PT End of Session - 06/06/18 1618    Visit Number  9    Number of Visits  13   mini reassess complete 06/01/18   Date for PT Re-Evaluation  06/22/18    Authorization Type  [employer] Medicare HMO (no visit limit, no auth required)    Authorization Time Period  05/11/18 - 06/23/18    Authorization - Visit Number  0    Authorization - Number of Visits  10    PT Start Time  1606    PT Stop Time  1649    PT Time Calculation (min)  43 min    Equipment Utilized During Treatment  Gait belt    Activity Tolerance  Patient tolerated treatment well    Behavior During Therapy  [facility] for tasks assessed/performed       Past Medical History:  Diagnosis Date  . Diabetes mellitus    lantus/novolog  . Hyperlipidemia   . Hypertension   . Marijuana abuse    occaisionally  . Noncompliance with medication regimen   . Stroke ([place])   . Tobacco abuse    5/day    Past Surgical History:  Procedure Laterality Date  . EYE SURGERY      There were no vitals filed for this visit.  Subjective Assessment - 06/06/18 1617    Subjective  PT reports no issues today.      Currently in Pain?  No/denies                       [facility] Adult PT Treatment/Exercise - 06/06/18 0001      Knee/Hip Exercises: Standing   Heel Raises  20 reps    Heel Raises Limitations  toe raises with slope    Hip Flexion  Both;20 reps    Forward Lunges  Both;15 reps;Limitations    Forward Lunges Limitations  no UE's balancing self, completing slowly and controlled    Hip Abduction  Both;15 reps    Forward Step Up  Both;Hand Hold: 1;Step Height: 6";15 reps    Forward Step Up Limitations  in bars  with 1 UE assist    Functional Squat  15 reps    Other Standing Knee Exercises  sidestepping over 3 6" hurdles 3RT in bars               PT Short Term Goals - 06/01/18 1520      PT SHORT TERM GOAL #1   Title  Patient will be indepndent with HEP, updated PRN, to imrpvoe balance, coordination, and strength to improve independence with functional mobility.     Time  3    Period  Weeks    Status  Achieved      PT SHORT TERM GOAL #2   Title  Patient will improve DGI by 4 points and FGA by 4 points to demonstrate significnat improvement in balance during ambulation.     Baseline  9/12: DGI improved by 1 point, FGA by 2 points    Time  3    Period  Weeks    Status  On-going      PT SHORT TERM GOAL #3   Title  Patient will  improve MMT for all limited groups by 1/2 grade or greater and will improve 5x sit to stand by 6 seconds to demonstrate improved functional stregth and independence with mobilty.    Baseline  9/12: see MMT, 5xSTS improved by 9 sec    Time  3    Period  Weeks    Status  Partially Met        PT Long Term Goals - 06/01/18 1521      PT LONG TERM GOAL #1   Title  Patient will ambulate at 1.0 m/s during 2MWT with LRAD to improve community acces with ambulation and reduce fall risk based on gati velocity.    Baseline  9/12: 229f, SPC, at 0.629m    Time  6    Period  Weeks    Status  On-going      PT LONG TERM GOAL #2   Title  Patient will improve DGI by 8 points (20/24) and FGA by 8 (20/30) points to demonstrate significnat improvement in balance during ambulation.     Baseline  9/12: DGI improved by 1 point, FGA by 2 points    Time  6    Period  Weeks    Status  On-going      PT LONG TERM GOAL #3   Title  Patient will perform SLS on bil LE for 30 seconds to demonstrate improved balance to improve quality and safety with giat and functional stair mobility.    Baseline  9/12: R: 7 sec, L: 5 sec    Time  6    Period  Weeks    Status  On-going             Plan - 06/06/18 1621    Clinical Impression Statement  Continued with focus on improving balance to further progress safety and stability with ambulation and related tasks. Able to increase reps today with most all exercises and completed all without rest break needed.  Pt without LOB during activity today.     Rehab Potential  Good    PT Frequency  2x / week    PT Duration  6 weeks    PT Treatment/Interventions  ADLs/Self Care Home Management;Aquatic Therapy;Electrical Stimulation;DME Instruction;Gait training;Stair training;Functional mobility training;Therapeutic activities;Therapeutic exercise;Balance training;Neuromuscular re-education;Patient/family education;Manual techniques;Passive range of motion;Taping    PT Next Visit Plan   Continue gait trainer and SPC with gait. continue dynamic balance activities, SLS balance training and functional strengthening activities with SLS.      PT Home Exercise Plan  Eval: Tandem and SLS at counter;     Consulted and Agree with Plan of Care  Patient       Patient will benefit from skilled therapeutic intervention in order to improve the following deficits and impairments:  Abnormal gait, Improper body mechanics, Decreased coordination, Decreased mobility, Decreased activity tolerance, Decreased endurance, Decreased strength, Decreased balance, Difficulty walking  Visit Diagnosis: Acute ischemic left MCA stroke (HCC)  Other abnormalities of gait and mobility  Other lack of coordination     Problem List Patient Active Problem List   Diagnosis Date Noted  . Stage 3 chronic kidney disease ([place]  . Hypoglycemia   . Neurosyphilis   . Mixed hyperlipidemia   . Acute ischemic left MCA stroke ([place]06/01/2018  . Abnormality of gait following cerebrovascular accident (CVA)   . 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 Essential hypertension   . Dyslipidemia   . Syphilis   .  DKA (diabetic  ketoacidoses) ([place]) 02/16/2018  . CVA (cerebral vascular accident) ([place]) 02/16/2018  . Acute renal failure (ARF) ([place]) 10/21/2016  . Diabetic ketoacidosis without coma associated with type 1 diabetes mellitus ([place])   . Hyperbilirubinemia   . Hyponatremia 04/15/2016  . DM (diabetes mellitus) type 1, uncontrolled, with ketoacidosis ([place]) 04/15/2016  . Hyperglycemia 04/15/2016  . AKI (acute kidney injury) ([place]) 01/04/2016  . Malignant hypertension 01/04/2016  . Noncompliance with medications 01/04/2016  . Intractable nausea and vomiting 04/01/2015  . Gastroparesis 04/01/2015  . Type 1 diabetes mellitus with complication ([place]) 97/10/6376  . Chronic hypertension   . Nausea with vomiting   . Abscess, gluteal, right 06/21/2014  . Nausea and vomiting 04/08/2013  . Hyperglycemia without ketosis 04/08/2013  . Essential hypertension, benign 04/08/2013  . Hypercalcemia 07/29/2011  . Vomiting 07/28/2011  . Leukocytosis 07/28/2011  . Hypokalemia 07/28/2011  . DKA, type 1 ([place]) 07/27/2011  . Dehydration 07/27/2011  . Smoker 07/27/2011  . Marijuana abuse 07/27/2011   Teena Irani, PTA/CLT [phone]  Teena Irani 06/06/2018, 4:48 PM  [facility] [address] [place], [place], [address] Phone: [phone]   Fax:  [phone]  Name: ROGER KETTLES MRN: [id number] Date of Birth: Jan 02, 1986

## 2018-06-08 ENCOUNTER — Ambulatory Visit (HOSPITAL_COMMUNITY): Payer: Medicare HMO | Admitting: Physical Therapy

## 2018-06-08 DIAGNOSIS — R278 Other lack of coordination: Secondary | ICD-10-CM

## 2018-06-08 DIAGNOSIS — I63512 Cerebral infarction due to unspecified occlusion or stenosis of left middle cerebral artery: Secondary | ICD-10-CM

## 2018-06-08 DIAGNOSIS — R2689 Other abnormalities of gait and mobility: Secondary | ICD-10-CM

## 2018-06-08 NOTE — Therapy (Signed)
[place], [place], [address] Phone: [phone]   Fax:  [phone]  Physical Therapy Treatment  Patient Details  Name: Nathaniel [name] MRN: [id number] Date of Birth: [birth year] Referring Provider: Venancio Poisson, NP   Encounter Date: 06/08/2018  PT End of Session - 06/08/18 1647    Visit Number  10   PN completed visit #8   Number of Visits  13   mini reassess complete 06/01/18   Date for PT Re-Evaluation  06/22/18    Authorization Type  [employer] Medicare HMO (no visit limit, no auth required)    Authorization Time Period  05/11/18 - 06/23/18    Authorization - Visit Number  0    Authorization - Number of Visits  10    PT Start Time  1610    PT Stop Time  1655    PT Time Calculation (min)  45 min    Equipment Utilized During Treatment  Gait belt    Activity Tolerance  Patient tolerated treatment well    Behavior During Therapy  [facility] for tasks assessed/performed       Past Medical History:  Diagnosis Date  . Diabetes mellitus    lantus/novolog  . Hyperlipidemia   . Hypertension   . Marijuana abuse    occaisionally  . Noncompliance with medication regimen   . Stroke ([place])   . Tobacco abuse    5/day    Past Surgical History:  Procedure Laterality Date  . EYE SURGERY      There were no vitals filed for this visit.  Subjective Assessment - 06/08/18 1618    Subjective  PT states he was not sore or had any issues following last session.  No pain today.      Currently in Pain?  No/denies                       [facility] Adult PT Treatment/Exercise - 06/08/18 0001      Knee/Hip Exercises: Standing   Heel Raises  20 reps    Heel Raises Limitations  toe raises with slope    Hip Flexion  Both;20 reps    Forward Lunges  Both;15 reps;Limitations    Forward Lunges Limitations  no UE's balancing self, completing slowly and controlled    Forward Step Up  Both;Hand Hold: 1;Step Height:  6";15 reps    Forward Step Up Limitations  in bars with 1 UE assist    Gait Training  vectors 1 HHA with 5" holds 10 reps on each LE    Other Standing Knee Exercises  sidestepping over 2 6" hurdle, 1 12" hurdle 2RT in bars    Other Standing Knee Exercises  standing on foam at bars, UE flexion with 1# cane 10 reps, tandem 3X 15-30" hlds each               PT Short Term Goals - 06/01/18 1520      PT SHORT TERM GOAL #1   Title  Patient will be indepndent with HEP, updated PRN, to imrpvoe balance, coordination, and strength to improve independence with functional mobility.     Time  3    Period  Weeks    Status  Achieved      PT SHORT TERM GOAL #2   Title  Patient will improve DGI by 4 points and FGA by 4 points to demonstrate significnat improvement in balance during ambulation.  Baseline  9/12: DGI improved by 1 point, FGA by 2 points    Time  3    Period  Weeks    Status  On-going      PT SHORT TERM GOAL #3   Title  Patient will improve MMT for all limited groups by 1/2 grade or greater and will improve 5x sit to stand by 6 seconds to demonstrate improved functional stregth and independence with mobilty.    Baseline  9/12: see MMT, 5xSTS improved by 9 sec    Time  3    Period  Weeks    Status  Partially Met        PT Long Term Goals - 06/01/18 1521      PT LONG TERM GOAL #1   Title  Patient will ambulate at 1.0 m/s during 2MWT with LRAD to improve community acces with ambulation and reduce fall risk based on gati velocity.    Baseline  9/12: 265f, SPC, at 0.658m    Time  6    Period  Weeks    Status  On-going      PT LONG TERM GOAL #2   Title  Patient will improve DGI by 8 points (20/24) and FGA by 8 (20/30) points to demonstrate significnat improvement in balance during ambulation.     Baseline  9/12: DGI improved by 1 point, FGA by 2 points    Time  6    Period  Weeks    Status  On-going      PT LONG TERM GOAL #3   Title  Patient will perform SLS on bil  LE for 30 seconds to demonstrate improved balance to improve quality and safety with giat and functional stair mobility.    Baseline  9/12: R: 7 sec, L: 5 sec    Time  6    Period  Weeks    Status  On-going            Plan - 06/08/18 1649    Clinical Impression Statement  increased challlenge today by adding 1 12" hurdle ot step over actvity and also added static stance on foam with UE flexion to increase confidence/stabiliyt without UE assist. Pt with noted challenge completing this.  No rest breaks needed during session today.     Rehab Potential  Good    PT Frequency  2x / week    PT Duration  6 weeks    PT Treatment/Interventions  ADLs/Self Care Home Management;Aquatic Therapy;Electrical Stimulation;DME Instruction;Gait training;Stair training;Functional mobility training;Therapeutic activities;Therapeutic exercise;Balance training;Neuromuscular re-education;Patient/family education;Manual techniques;Passive range of motion;Taping    PT Next Visit Plan   Continue gait trainer and SPC with gait. continue dynamic balance activities, SLS balance training and functional strengthening activities with SLS.      PT Home Exercise Plan  Eval: Tandem and SLS at counter;     Consulted and Agree with Plan of Care  Patient       Patient will benefit from skilled therapeutic intervention in order to improve the following deficits and impairments:  Abnormal gait, Improper body mechanics, Decreased coordination, Decreased mobility, Decreased activity tolerance, Decreased endurance, Decreased strength, Decreased balance, Difficulty walking  Visit Diagnosis: Acute ischemic left MCA stroke (HCC)  Other abnormalities of gait and mobility  Other lack of coordination     Problem List Patient Active Problem List   Diagnosis Date Noted  . Stage 3 chronic [place] disease [place]  . Hypoglycemia   . Neurosyphilis   . Mixed  hyperlipidemia   . Acute ischemic left MCA stroke ([place]) 02/22/2018  .  Abnormality of gait following cerebrovascular accident (CVA)   . Type 1 diabetes mellitus with peripheral circulatory complications ([place])   . Essential hypertension   . Dyslipidemia   . Syphilis   . DKA (diabetic ketoacidoses) ([place]) 02/16/2018  . CVA (cerebral vascular accident) ([place]) 02/16/2018  . Acute renal failure (ARF) ([place]) 10/21/2016  . Diabetic ketoacidosis without coma associated with type 1 diabetes mellitus ([place])   . Hyperbilirubinemia   . Hyponatremia 04/15/2016  . DM (diabetes mellitus) type 1, uncontrolled, with ketoacidosis ([place]) 04/15/2016  . Hyperglycemia 04/15/2016  . AKI (acute kidney injury) ([place]) 01/04/2016  . Malignant hypertension 01/04/2016  . Noncompliance with medications 01/04/2016  . Intractable nausea and vomiting 04/01/2015  . Gastroparesis 04/01/2015  . Type 1 diabetes mellitus with complication ([place]) 16/96/7893  . Chronic hypertension   . Nausea with vomiting   . Abscess, gluteal, right 06/21/2014  . Nausea and vomiting 04/08/2013  . Hyperglycemia without ketosis 04/08/2013  . Essential hypertension, benign 04/08/2013  . Hypercalcemia 07/29/2011  . Vomiting 07/28/2011  . Leukocytosis 07/28/2011  . Hypokalemia 07/28/2011  . DKA, type 1 ([place]) 07/27/2011  . Dehydration 07/27/2011  . Smoker 07/27/2011  . Marijuana abuse 07/27/2011   Teena Irani, PTA/CLT [phone]  Teena Irani 06/08/2018, 5:00 PM  [facility] [address] [place], [place], [address] Phone: [phone]   Fax:  [phone]  Name: DEMIR TITSWORTH MRN: [id number] Date of Birth: 1986/07/25

## 2018-06-13 ENCOUNTER — Ambulatory Visit (HOSPITAL_COMMUNITY): Payer: Medicare HMO | Admitting: Physical Therapy

## 2018-06-13 ENCOUNTER — Telehealth (HOSPITAL_COMMUNITY): Payer: Self-pay | Admitting: Physical Therapy

## 2018-06-13 NOTE — Telephone Encounter (Signed)
His sugar dropped to 42 and he will not be here today

## 2018-06-15 ENCOUNTER — Other Ambulatory Visit: Payer: Self-pay

## 2018-06-15 ENCOUNTER — Ambulatory Visit (HOSPITAL_COMMUNITY): Payer: Medicare HMO

## 2018-06-15 ENCOUNTER — Encounter (HOSPITAL_COMMUNITY): Payer: Self-pay

## 2018-06-15 DIAGNOSIS — R278 Other lack of coordination: Secondary | ICD-10-CM

## 2018-06-15 DIAGNOSIS — R2689 Other abnormalities of gait and mobility: Secondary | ICD-10-CM

## 2018-06-15 DIAGNOSIS — I63512 Cerebral infarction due to unspecified occlusion or stenosis of left middle cerebral artery: Secondary | ICD-10-CM | POA: Diagnosis not present

## 2018-06-15 NOTE — Therapy (Signed)
[place], [place], [address] Phone: [phone]   Fax:  [phone]  Physical Therapy Treatment  Patient Details  Name: Nathaniel [name] MRN: [id number] Date of Birth: 1985-12-29 Referring Provider (PT): Venancio Poisson, NP   Encounter Date: 06/15/2018  PT End of Session - 06/15/18 1607    Visit Number  11   PN completed visit #8   Number of Visits  13   mini reassess complete 06/01/18   Date for PT Re-Evaluation  06/22/18    Authorization Type  [employer] Medicare HMO (no visit limit, no auth required)    Authorization Time Period  05/11/18 - 06/23/18    Authorization - Visit Number  1    Authorization - Number of Visits  10    PT Start Time  1518    PT Stop Time  1559    PT Time Calculation (min)  41 min    Equipment Utilized During Treatment  Gait belt    Activity Tolerance  Patient tolerated treatment well    Behavior During Therapy  [facility] for tasks assessed/performed       Past Medical History:  Diagnosis Date  . Diabetes mellitus    lantus/novolog  . Hyperlipidemia   . Hypertension   . Marijuana abuse    occaisionally  . Noncompliance with medication regimen   . Stroke ([place])   . Tobacco abuse    5/day    Past Surgical History:  Procedure Laterality Date  . EYE SURGERY      There were no vitals filed for this visit.  Subjective Assessment - 06/15/18 1530    Subjective  Patient reports his blood sugar was better today at 140 this morning when he checked it. he reports he is feeling well.     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 Pt was at [facility] from 6/5-6/19 then discharged home and received [facility]    Limitations  Lifting;Standing;Walking;Writing;House hold activities    Patient Stated Goals  to be able to walk without a device    Currently in Pain?  No/denies       [facility] Adult PT Treatment/Exercise - 06/15/18 0001       Ambulation/Gait   Ambulation/Gait  Yes    Ambulation/Gait Assistance  6: Modified independent (Device/Increase time)    Ambulation Distance (Feet)  226 Feet    Assistive device  Straight cane    Gait Pattern  Step-through pattern;Decreased stride length;Right circumduction;Ataxic;Decreased hip/knee flexion - right;Poor foot clearance - right    Ambulation Surface  Level;Indoor    Gait Comments  cues to attempt gait without SPC and patient maintained step through pattern with increased unsteadiness and hesitancy to step      Neuro Re-ed    Neuro Re-ed Details   Ladder drill for gait training: single foot to a box, for step length/width training with SPC; 4x RT. Continued with small lap in gym durign 4th rep and patient was able to maintain step sequence and timing outside of ladder      Exercises   Exercises  Knee/Hip      Knee/Hip Exercises: Aerobic   Tread Mill  Gait trainer height '6\' 2"'  at 1.5 x 42mn; main cueing for increased Lt LE stride length; initially with bil UE support, transitioned to Rt UE support      Knee/Hip Exercises: Standing   Heel Raises  Both;1 set;20 reps;3  seconds   on incline   Heel Raises Limitations  toe raises, 1x 20 reps on decline    SLS with Vectors  5reps Bil LE, with single UE support at counter, 3 way vector iwth 3 second holds for HEP instruction    Other Standing Knee Exercises  side stepping at counter, 2x for HEP instruction       Balance Exercises - 06/15/18 1615      Balance Exercises: Standing   Step Over Hurdles / Cones  Forward step over wiht SPC, 8 hurdles alt (6"/12") 2x and 2x with 4x 6" hurdles; 2x Rt/Lt lateral step over 4x 6" hurdles (gradually decreasing UE support throughout)        PT Education - 06/15/18 1607    Education Details  updated HEP and educated on exercises throughout session.    Person(s) Educated  Patient    Methods  Explanation;Handout;Demonstration;Verbal cues    Comprehension  Verbalized  understanding;Returned demonstration       PT Short Term Goals - 06/01/18 1520      PT SHORT TERM GOAL #1   Title  Patient will be indepndent with HEP, updated PRN, to imrpvoe balance, coordination, and strength to improve independence with functional mobility.     Time  3    Period  Weeks    Status  Achieved      PT SHORT TERM GOAL #2   Title  Patient will improve DGI by 4 points and FGA by 4 points to demonstrate significnat improvement in balance during ambulation.     Baseline  9/12: DGI improved by 1 point, FGA by 2 points    Time  3    Period  Weeks    Status  On-going      PT SHORT TERM GOAL #3   Title  Patient will improve MMT for all limited groups by 1/2 grade or greater and will improve 5x sit to stand by 6 seconds to demonstrate improved functional stregth and independence with mobilty.    Baseline  9/12: see MMT, 5xSTS improved by 9 sec    Time  3    Period  Weeks    Status  Partially Met        PT Long Term Goals - 06/01/18 1521      PT LONG TERM GOAL #1   Title  Patient will ambulate at 1.0 m/s during 2MWT with LRAD to improve community acces with ambulation and reduce fall risk based on gati velocity.    Baseline  9/12: 215f, SPC, at 0.641m    Time  6    Period  Weeks    Status  On-going      PT LONG TERM GOAL #2   Title  Patient will improve DGI by 8 points (20/24) and FGA by 8 (20/30) points to demonstrate significnat improvement in balance during ambulation.     Baseline  9/12: DGI improved by 1 point, FGA by 2 points    Time  6    Period  Weeks    Status  On-going      PT LONG TERM GOAL #3   Title  Patient will perform SLS on bil LE for 30 seconds to demonstrate improved balance to improve quality and safety with giat and functional stair mobility.    Baseline  9/12: R: 7 sec, L: 5 sec    Time  6    Period  Weeks    Status  On-going  Plan - 06/15/18 1608    Clinical Impression Statement  This session focused on gait training and  balance interventions. Patient continued with gait trainer and was able to perform with single UE assistance. He also attempted gait without SPC  in open environment but demonstrated increased step width, uneven step length and unsteadiness. He performed ladder drill as visual cue to improve step width and length, gait improved with cue to increase speed and patient's hesitancy was increasing unsteadiness. He performed forward/lateral step over for hurdles of varying heights and requires UE support but was able to decrease support throughout activity with min assist/guard. HEP was updated today with additional balance exercises at counter. He will continue to benefit from skilled PT interventions to address impairments and progress independence with functional mobility.     Rehab Potential  Good    PT Frequency  2x / week    PT Duration  6 weeks    PT Treatment/Interventions  ADLs/Self Care Home Management;Aquatic Therapy;Electrical Stimulation;DME Instruction;Gait training;Stair training;Functional mobility training;Therapeutic activities;Therapeutic exercise;Balance training;Neuromuscular re-education;Patient/family education;Manual techniques;Passive range of motion;Taping    PT Next Visit Plan  Continue gait trainer and SPC with gait. Continue dynamic balance activities. Continue with ladder step training to improve step length and width.      PT Home Exercise Plan  Eval: Tandem and SLS at counter; 06/15/18 - side step at counter, SLS with vectors at counter    Consulted and Agree with Plan of Care  Patient       Patient will benefit from skilled therapeutic intervention in order to improve the following deficits and impairments:  Abnormal gait, Improper body mechanics, Decreased coordination, Decreased mobility, Decreased activity tolerance, Decreased endurance, Decreased strength, Decreased balance, Difficulty walking  Visit Diagnosis: Acute ischemic left MCA stroke (HCC)  Other abnormalities of  gait and mobility  Other lack of coordination     Problem List Patient Active Problem List   Diagnosis Date Noted  . Stage 3 chronic kidney disease ([place])   . Hypoglycemia   . Neurosyphilis   . Mixed hyperlipidemia   . Acute ischemic left MCA stroke ([place]) 02/22/2018  . Abnormality of gait following cerebrovascular accident (CVA)   . 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 Essential hypertension   . Dyslipidemia   . Syphilis   . DKA (diabetic ketoacidoses) ([place]) 02/16/2018  . CVA (cerebral vascular accident) ([place]) 02/16/2018  . Acute renal failure (ARF) ([place]) 10/21/2016  . Diabetic ketoacidosis without coma associated with type 1 diabetes mellitus ([place])   . Hyperbilirubinemia   . Hyponatremia 04/15/2016  . DM (diabetes mellitus) type 1, uncontrolled, with ketoacidosis ([place]) 04/15/2016  . Hyperglycemia 04/15/2016  . AKI (acute kidney injury) ([place]) 01/04/2016  . Malignant hypertension 01/04/2016  . Noncompliance with medications 01/04/2016  . Intractable nausea and vomiting 04/01/2015  . Gastroparesis 04/01/2015  . Type 1 diabetes mellitus with complication ([place]) 03/55/9741  . Chronic hypertension   . Nausea with vomiting   . Abscess, gluteal, right 06/21/2014  . Nausea and vomiting 04/08/2013  . Hyperglycemia without ketosis 04/08/2013  . Essential hypertension, benign 04/08/2013  . Hypercalcemia 07/29/2011  . Vomiting 07/28/2011  . Leukocytosis 07/28/2011  . Hypokalemia 07/28/2011  . DKA, type 1 ([place]) 07/27/2011  . Dehydration 07/27/2011  . Smoker 07/27/2011  . Marijuana abuse 07/27/2011    Kipp Brood, PT, DPT Physical Therapist with [facility]  06/15/2018 4:17 PM    [place], [place], [address] Phone: [phone]   Fax:  [phone]  Name: Nathaniel [name] MRN: [id number] Date of Birth: 20-Jul-1986

## 2018-06-15 NOTE — Patient Instructions (Signed)
  Exercises  Side Stepping with Counter Support - 10 reps - 3 sets - 1x daily - 7x weekly  Standing 3-Way Kick - 5 reps - 3 sets - 3 seconds hold - 1x daily - 7x weekly

## 2018-06-20 ENCOUNTER — Other Ambulatory Visit: Payer: Self-pay

## 2018-06-20 ENCOUNTER — Encounter (HOSPITAL_COMMUNITY): Payer: Self-pay

## 2018-06-20 ENCOUNTER — Encounter: Payer: Self-pay | Admitting: Endocrinology

## 2018-06-20 ENCOUNTER — Ambulatory Visit (HOSPITAL_COMMUNITY): Payer: Medicare HMO | Attending: Adult Health

## 2018-06-20 DIAGNOSIS — R2689 Other abnormalities of gait and mobility: Secondary | ICD-10-CM | POA: Diagnosis present

## 2018-06-20 DIAGNOSIS — I63512 Cerebral infarction due to unspecified occlusion or stenosis of left middle cerebral artery: Secondary | ICD-10-CM

## 2018-06-20 DIAGNOSIS — R278 Other lack of coordination: Secondary | ICD-10-CM | POA: Insufficient documentation

## 2018-06-20 NOTE — Therapy (Signed)
[place], [place], [address] Phone: [phone]   Fax:  [phone]  Physical Therapy Treatment  Patient Details  Name: Nathaniel [name] MRN: [id number] Date of Birth: 18-Mar-1986 Referring Provider (PT): Venancio Poisson, NP   Encounter Date: 06/20/2018  PT End of Session - 06/20/18 1755    Visit Number  12   PN completed visit #8   Number of Visits  13   mini reassess complete 06/01/18   Date for PT Re-Evaluation  06/22/18    Authorization Type  [employer] HMO (no visit limit, no auth required)    Authorization Time Period  05/11/18 - 06/23/18    Authorization - Visit Number  4    Authorization - Number of Visits  10    PT Start Time  6468    PT Stop Time  1728    PT Time Calculation (min)  42 min    Equipment Utilized During Treatment  Gait belt    Activity Tolerance  Patient tolerated treatment well    Behavior During Therapy  [facility] for tasks assessed/performed       Past Medical History:  Diagnosis Date  . Diabetes mellitus    lantus/novolog  . Hyperlipidemia   . Hypertension   . Marijuana abuse    occaisionally  . Noncompliance with medication regimen   . Stroke ([place])   . Tobacco abuse    5/day    Past Surgical History:  Procedure Laterality Date  . EYE SURGERY      There were no vitals filed for this visit.  Subjective Assessment - 06/20/18 1746    Subjective  Patient reports he is feelign well today. He states he went to see Dr. [name] today and that the visit went well. He does not have to go back for a couple months.    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 Pt was at [facility] from 6/5-6/19 then discharged home and received [facility] services    Limitations  Lifting;Standing;Walking;Writing;House hold activities    Patient Stated Goals  to be able to walk without a device    Currently in Pain?  No/denies         [facility] Adult PT Treatment/Exercise - 06/20/18 0001      Neuro Re-ed    Neuro Re-ed Details   Ladder drill for gait training: single foot to a box, for step length/width training without SPC; 4x RT.  Lateral stepping 2x RT to Rt/Lt without SPC.       Balance Exercises - 06/20/18 1747      Balance Exercises: Standing   Tandem Stance  Eyes open;Foam/compliant surface;Other reps (comment);Intermittent upper extremity support   x 10 bil horizontal head turns   Stepping Strategy  Anterior;Posterior;Lateral;10 reps   four square step 10 (clockwise/counter-clockwise)   Partial Tandem Stance  Eyes open;Foam/compliant surface;Intermittent upper extremity support;Other reps (comment);2 reps   with bil UE flex, 10 reps, 2# dowel, alt foot align   Other Standing Exercises  Stepping strategies: stepping ispilateral foot to color spot and contralateral reachign with boom whacker (bilateral directions) 2x 10        PT Education - 06/20/18 1754    Education Details  Educated on new exercises and stepping strategies as well as balance strategies.    Person(s) Educated  Patient    Methods  Explanation;Tactile cues;Verbal cues    Comprehension  Verbalized understanding;Returned demonstration  PT Short Term Goals - 06/01/18 1520      PT SHORT TERM GOAL #1   Title  Patient will be indepndent with HEP, updated PRN, to imrpvoe balance, coordination, and strength to improve independence with functional mobility.     Time  3    Period  Weeks    Status  Achieved      PT SHORT TERM GOAL #2   Title  Patient will improve DGI by 4 points and FGA by 4 points to demonstrate significnat improvement in balance during ambulation.     Baseline  9/12: DGI improved by 1 point, FGA by 2 points    Time  3    Period  Weeks    Status  On-going      PT SHORT TERM GOAL #3   Title  Patient will improve MMT for all limited groups by 1/2 grade or greater and will improve 5x sit to stand by 6 seconds to  demonstrate improved functional stregth and independence with mobilty.    Baseline  9/12: see MMT, 5xSTS improved by 9 sec    Time  3    Period  Weeks    Status  Partially Met        PT Long Term Goals - 06/01/18 1521      PT LONG TERM GOAL #1   Title  Patient will ambulate at 1.0 m/s during 2MWT with LRAD to improve community acces with ambulation and reduce fall risk based on gati velocity.    Baseline  9/12: 259f, SPC, at 0.673m    Time  6    Period  Weeks    Status  On-going      PT LONG TERM GOAL #2   Title  Patient will improve DGI by 8 points (20/24) and FGA by 8 (20/30) points to demonstrate significnat improvement in balance during ambulation.     Baseline  9/12: DGI improved by 1 point, FGA by 2 points    Time  6    Period  Weeks    Status  On-going      PT LONG TERM GOAL #3   Title  Patient will perform SLS on bil LE for 30 seconds to demonstrate improved balance to improve quality and safety with giat and functional stair mobility.    Baseline  9/12: R: 7 sec, L: 5 sec    Time  6    Period  Weeks    Status  On-going        Plan - 06/20/18 1756    Clinical Impression Statement  Continued with focus on balance training and coordination training. He demonstrated improved stepping strategies with the ladder drill while walking forward but continues to require min assist to prevent LOB with side stepping. He performed dual task tandem balance challenges today as well and had greater difficulty with Rt foot positioned back requiring manual facilitation for weight shift. Patient initiated step strategies with reaching today and his mother participated to assist with the exercise. He will benefit from re-assessment next session and continuation of therapy given good progress so far. He will continue to benefit from skilled PT interventions to address impairments and progress independence with functional mobility.    Rehab Potential  Good    PT Frequency  2x / week    PT  Duration  6 weeks    PT Treatment/Interventions  ADLs/Self Care Home Management;Aquatic Therapy;Electrical Stimulation;DME Instruction;Gait training;Stair training;Functional mobility training;Therapeutic activities;Therapeutic exercise;Balance training;Neuromuscular re-education;Patient/family education;Manual techniques;Passive  range of motion;Taping    PT Next Visit Plan  Continue with ladder drills for coordination and stepping drills with colored spots on floor. Patient does well with visual cues. Progress gait training without SPC and with facilitation of arm swing.     PT Home Exercise Plan  Eval: Tandem and SLS at counter; 06/15/18 - side step at counter, SLS with vectors at counter    Consulted and Agree with Plan of Care  Patient    Family Member Consulted  patient's mother "Laverta Baltimore"       Patient will benefit from skilled therapeutic intervention in order to improve the following deficits and impairments:  Abnormal gait, Improper body mechanics, Decreased coordination, Decreased mobility, Decreased activity tolerance, Decreased endurance, Decreased strength, Decreased balance, Difficulty walking  Visit Diagnosis: Acute ischemic left MCA stroke (HCC)  Other abnormalities of gait and mobility  Other lack of coordination     Problem List Patient Active Problem List   Diagnosis Date Noted  . Stage 3 chronic kidney disease ([place])   . Hypoglycemia   . Neurosyphilis   . Mixed hyperlipidemia   . Acute ischemic left MCA stroke ([place]) 02/22/2018  . Abnormality of gait following cerebrovascular accident (CVA)   . Type 1 diabetes mellitus with peripheral circulatory complications ([place])   . Essential hypertension   . Dyslipidemia   . Syphilis   . DKA (diabetic ketoacidoses) ([place]) 02/16/2018  . CVA (cerebral vascular accident) ([place]) 02/16/2018  . Acute renal failure (ARF) ([place]) 10/21/2016  . Diabetic ketoacidosis without coma associated with type 1 diabetes mellitus ([place])   .  Hyperbilirubinemia   . Hyponatremia 04/15/2016  . DM (diabetes mellitus) type 1, uncontrolled, with ketoacidosis ([place]) 04/15/2016  . Hyperglycemia 04/15/2016  . AKI (acute kidney injury) ([place]) 01/04/2016  . Malignant hypertension 01/04/2016  . Noncompliance with medications 01/04/2016  . Intractable nausea and vomiting 04/01/2015  . Gastroparesis 04/01/2015  . Type 1 diabetes mellitus with complication ([place]) 71/16/5790  . Chronic hypertension   . Nausea with vomiting   . Abscess, gluteal, right 06/21/2014  . Nausea and vomiting 04/08/2013  . Hyperglycemia without ketosis 04/08/2013  . Essential hypertension, benign 04/08/2013  . Hypercalcemia 07/29/2011  . Vomiting 07/28/2011  . Leukocytosis 07/28/2011  . Hypokalemia 07/28/2011  . DKA, type 1 ([place]) 07/27/2011  . Dehydration 07/27/2011  . Smoker 07/27/2011  . Marijuana abuse 07/27/2011    Kipp Brood, PT, DPT Physical Therapist with [facility]  06/20/2018 6:06 PM    [place] [address] [place], [place], [address] Phone: [phone]   Fax:  [phone]  Name: Nathaniel [name] MRN: [id number] Date of Birth: 1986-03-29

## 2018-06-20 NOTE — Progress Notes (Signed)
Patient ID: Nathaniel [name], male   DOB: April 26, 1986, 32 y.o.   MRN: [id number]           Reason for Appointment : Consultation for Type 1 Diabetes  History of Present Illness   Referring HCP: [place]         Diagnosis: Type 1 diabetes mellitus, date of diagnosis:  ?  2004        Previous history:  He has had recurrent hospitalizations for ketoacidosis especially in 2017 and 2018, last episode was in 01/2018 Overall has had consistently poor control with A1c as high as 16 In the past he has required large doses of at least basal insulin, taking up to 200 units of Lantus daily at some point  Recent history:     INSULIN regimen is: Levemir 60-70 units a.m.--50 units at 7 pm.. Novolog 5-30 units mostly twice a day  His most recent A1c is 10.2 and has been about the same  Current management, blood sugar patterns and problems identified:    He says he takes 60 or 70 units of insulin in the morning with his Levemir based on his blood sugar level  Although he thinks he is taking his NovoLog with every meal he will sometimes take only 5 units before eating and then take additional doses depending on what his blood sugars are after eating      He thinks he is mostly going by a sliding scale that he does not have in front of him today  He has various glucose monitors at home and does not appear to be using the same monitor consistently  His [employer] monitor has only sporadic readings from last month and none recorded for the last 10 days  He does not rotate his injection site and mostly using the right abdomen   Glucose monitoring:  is being done 3 times a day         Glucometer:  [employer] brand      Blood Glucose readings from meter review   Hypoglycemia:  occurs at variable times, occasionally at night also Factors causing hyperglycemia: Excessive NovoLog insulin Symptoms of hypoglycemia: Feeling tired, sweaty  Treatment of hypoglycemia: Glucose tablets, usually 2 at a time and  use        Self-care: The diet that the patient has been following is: None  Mealtimes are: Very variable, mostly eating 2 meals a day              Dietician consultation: Most recent: years ago.         CDE consultation: Years ago  Diabetes labs:  Lab Results  Component Value Date   HGBA1C 10.0 (H) 02/19/2018   HGBA1C 10.1 (H) 02/17/2018   HGBA1C 10.3 (H) 10/21/2016   Lab Results  Component Value Date   LDLCALC 201 (H) 02/19/2018   CREATININE 1.99 (H) 03/08/2018    No results found for: MICRALBCREAT   Allergies as of 06/21/2018      Reactions   Fructose Other (See Comments)   Increase of blood sugar      Medication List        Accurate as of 06/21/18  1:03 PM. Always use your most recent med list.          acetaminophen 325 MG tablet Commonly known as:  TYLENOL Take 1-2 tablets (325-650 mg total) by mouth every 4 (four) hours as needed for mild pain.   amLODipine 10 MG tablet Commonly known as:  NORVASC Take  1 tablet (10 mg total) by mouth daily.   aspirin 325 MG EC tablet TAKE 1 TABLET BY MOUTH EVERY DAY   atorvastatin 80 MG tablet Commonly known as:  LIPITOR Take 1 tablet (80 mg total) by mouth daily at 6 PM.   cloNIDine 0.1 mg/24hr patch Commonly known as:  CATAPRES - Dosed in mg/24 hr Place 1 patch (0.1 mg total) onto the skin once a week.   clopidogrel 75 MG tablet Commonly known as:  PLAVIX Take 75 mg by mouth daily.   FREESTYLE LIBRE 14 DAY READER Devi 1 Device by Does not apply route once for 1 dose.   FREESTYLE LIBRE 14 DAY SENSOR Misc 1 Units by Does not apply route every 14 (fourteen) days.   insulin aspart 100 UNIT/ML injection Commonly known as:  novoLOG Use 5 units with lunch and supper   Insulin Degludec 200 UNIT/ML Sopn Inject 96 Units into the skin daily.   insulin detemir 100 UNIT/ML injection Commonly known as:  LEVEMIR Use 16 units with breakfast and 16 units with supper daily   lisinopril 20 MG tablet Commonly known  as:  PRINIVIL,ZESTRIL Take 20 mg by mouth daily.       Allergies:  Allergies  Allergen Reactions  . Fructose Other (See Comments)    Increase of blood sugar     Past Medical History:  Diagnosis Date  . Diabetes mellitus    lantus/novolog  . Hyperlipidemia   . Hypertension   . Marijuana abuse    occaisionally  . Noncompliance with medication regimen   . Stroke ([place])   . Tobacco abuse    5/day    Past Surgical History:  Procedure Laterality Date  . EYE SURGERY      Family History  Problem Relation Age of Onset  . Diabetes Father   . Kidney failure Father        last 4 mnths of life  . Hypertension Mother   . Diabetes Maternal Grandmother   . Pancreatic cancer Maternal Grandfather   . Diabetes Paternal Grandfather     Social History:  reports that he has been smoking cigarettes. He has a 2.00 pack-year smoking history. He has never used smokeless tobacco. He reports that he has current or past drug history. Drug: Marijuana. He reports that he does not drink alcohol.      Review of Systems  HENT: Negative for trouble swallowing.   Eyes: Negative for blurred vision.  Respiratory: Negative for shortness of breath.   Cardiovascular: Negative for leg swelling.  Gastrointestinal: Negative for diarrhea.  Endocrine: Negative for fatigue.  Genitourinary: Negative for frequency.  Musculoskeletal: Negative for joint pain.  Neurological: Positive for weakness. Negative for numbness and tingling.       Right-sided weakness is slightly better, still has some difficulty with balance and is getting physical therapy after his stroke in June        Lipids: After his CVA in June he was started on Lipitor 80 mg, recent LDL 157 from PCP  Lab Results  Component Value Date   CHOL 260 (H) 02/19/2018   HDL 40 (L) 02/19/2018   LDLCALC 201 (H) 02/19/2018   TRIG 93 02/19/2018   CHOLHDL 6.5 02/19/2018   HYPERTENSION: Has been on treatment for several years, recently no change  made by PCP even though he was told that his blood pressure was high   BP Readings from Last 3 Encounters:  06/21/18 (!) 170/110  04/26/18 (!) 143/98  04/10/18 131/72  Kidney disease: He is not aware of his kidney function being impaired and appears to be more consistently worse since about 2018 Creatinine on 06/20/2018 is 2.1 with GFR 48  Lab Results  Component Value Date   CREATININE 1.99 (H) 03/08/2018   CREATININE 2.06 (H) 03/07/2018   CREATININE 2.04 (H) 03/06/2018    LABS:  No visits with results within 1 Week(s) from this visit.  Latest known visit with results is:  No results displayed because visit has over 200 results.      Physical Examination:  BP (!) 170/110   Pulse 97   Ht 6\' 2"  (1.88 m)   Wt 151 lb (68.5 kg)   SpO2 99%   BMI 19.39 kg/m   GENERAL:  Relatively asthenic  HEENT:         Eye exam shows normal external appearance. Fundus exam shows no retinopathy. Oral exam shows normal mucosa .  NECK:         there is no lymphadenopathy.   Thyroid is not enlarged but a small nodule of 1.5 cm protein in the right lower lobe  LUNGS:         Chest is symmetrical. Lungs are clear to auscultation.Marland Kitchen   HEART:         Heart sounds:  S1 and S2 are normal. No murmurs or clicks heard., no S3 or S4.   ABDOMEN:  no distention present. Liver and spleen are not palpable. No other mass or tenderness present.  EXTREMITIES:     There is no edema. No skin lesions present.Marland Kitchen  NEUROLOGICAL:        Vibration sense is moderately reduced in toes. Ankle jerks are absent bilaterally.      Motor power not assessed He has difficulty getting up from sitting position without support, ambulating with a cane  Diabetic Foot Exam - Simple   Simple Foot Form Diabetic Foot exam was performed with the following findings:  Yes   Visual Inspection No deformities, no ulcerations, no other skin breakdown bilaterally:  Yes Sensation Testing Intact to touch and monofilament testing bilaterally:   Yes Pulse Check See comments:  Yes Comments Decreased pulses on the right foot         MUSCULOSKELETAL:       There is no enlargement or deformity of the joints.  SKIN:       No rash, lesions or abnormal pigmentation       ASSESSMENT:  Diabetes type 1,  Problems identified:  Erratic glucose monitoring and not able to get any consistent blood sugar records today  No consistent blood sugar pattern identified by history  He has very labile blood sugars reportedly but recently appears to have high readings  Very irregular mealtime and insulin schedules  Likely not taking enough insulin to cover his meals and mostly taking NovoLog postprandially when blood sugars are going up  This may occasionally cause tendency to hypoglycemia later on  History of significant hypoglycemia sometimes requiring assistance although reportedly has not had any hypoglycemic unresponsiveness  Not taking basal insulin at consistent times in the mornings and also taking variable doses  Lipohypertrophy of the lateral right abdomen and continuing to inject in that area  Complications: Nephropathy, likely has underlying retinopathy  HYPERTENSION: Poorly controlled  Chronic kidney disease: Recommend that he be starting to see a nephrologist, will defer to PCP  HYPERCHOLESTEROLEMIA: Appears to be inadequately controlled with LDL 157 on high-dose Lipitor  Right-sided thyroid nodule: Consider thyroid ultrasound, will  reexamine on the next visit  PLAN:   1. Glucose monitoring: More consistent information on glucose patterns will be obtained by using the freestyle libre sensor and discussed with the patient how this works.  He was given information on this and a coupon to get a free meter and 1 months supply of sensors . Patient advised to check readings consistently fasting, before meals and also some 2 hours after meals . Discussed blood sugar targets both fasting and after meals  2.  Diabetes  education: . Patient will need considerable diabetes education both with regards to meal planning, help with estimating mealtime insulin doses and introduction to at carbohydrate counting.  Referral has been done and he will call to schedule appointment  3.  Lifestyle changes: . Dietary changes needed: He needs to cut back on high fat foods which make blood sugar control difficult and needs balanced meals with some protein:   4.  Medication changes needed: . LEVEMIR will be changed to [facility] for more consistent 24-hour action as well as likely better efficacy We will change to 90 units daily to start with and take it consistently in the evening Given him flowsheet to titrate the insulin dose by 5 units every 3 days until morning sugars are at least under 150 and preferably under 130 However can reduce the dose if after time morning sugars are below 90  5.  Preventive care needed:  . He needs to set up an eye exam  6.  Follow-up: 2 weeks  HYPERTENSION: Because of his marked hypertension who associated with renal failure he will need to add clonidine 0.1 mg patch weekly in addition to his lisinopril and Norvasc .  LIPIDS: Deferred to PCP, likely needs addition of Zetia with persistent hypercholesterolemia and history of CVA   Patient Instructions  Change LEVEMIR to Tresiba 90 units daily in the evening and adjust every 3 days x 5 units until morning sugars at least under 150 If blood sugar in the mornings are below 100 and go down  by 10 units  NOVOLOG: Take this only before eating based on what you are eating and blood sugar level  For small meals take 10 units and larger meals take 20 units, take right before eating Additional insulin to be added at mealtime for high sugars:  Take additional 5 units for every 50 points over 150: Blood sugar 200+: Additional 10 units Blood sugar 250: Additional 15 units Blood sugar 300: Additional 20 units Blood sugar 400: Additional 25  units  If the blood sugar is high 2 to 3 hours after eating may take extra insulin only 50% of the above sliding scale  Avoid high fat foods, stop eating [place] fries and other fried foods as much as possible  For low blood sugars use at least 3-4 glucose tablets at a time  Schedule exam with eye doctor   Counseling time on subjects discussed in assessment and plan sections is over 50% of today's 60 minute visit   Elayne Snare 06/21/2018, 1:03 PM   Copy of consultation sent to referring physician   Note: This note was prepared with Dragon voice recognition system technology. Any transcriptional errors that result from this process are unintentional.

## 2018-06-21 ENCOUNTER — Encounter: Payer: Self-pay | Admitting: Endocrinology

## 2018-06-21 ENCOUNTER — Ambulatory Visit (INDEPENDENT_AMBULATORY_CARE_PROVIDER_SITE_OTHER): Payer: Medicare HMO | Admitting: Endocrinology

## 2018-06-21 VITALS — BP 170/110 | HR 97 | Ht 74.0 in | Wt 151.0 lb

## 2018-06-21 DIAGNOSIS — E1065 Type 1 diabetes mellitus with hyperglycemia: Secondary | ICD-10-CM | POA: Diagnosis not present

## 2018-06-21 LAB — POCT GLYCOSYLATED HEMOGLOBIN (HGB A1C): Hemoglobin A1C: 10.2 % — AB (ref 4.0–5.6)

## 2018-06-21 LAB — GLUCOSE, POCT (MANUAL RESULT ENTRY): POC GLUCOSE: 595 mg/dL — AB (ref 70–99)

## 2018-06-21 MED ORDER — FREESTYLE LIBRE 14 DAY SENSOR MISC: 1.0000 [IU] | 4 refills | Status: DC

## 2018-06-21 MED ORDER — FREESTYLE LIBRE 14 DAY READER DEVI: 1.0000 | Freq: Once | 0 refills | Status: AC

## 2018-06-21 MED ORDER — CLONIDINE 0.1 MG/24HR TD PTWK: 0.1000 mg | MEDICATED_PATCH | TRANSDERMAL | 12 refills | Status: DC

## 2018-06-21 MED ORDER — INSULIN DEGLUDEC 200 UNIT/ML ~~LOC~~ SOPN: 95.0000 [IU] | PEN_INJECTOR | Freq: Every day | SUBCUTANEOUS | 1 refills | Status: DC

## 2018-06-21 NOTE — Patient Instructions (Addendum)
Change LEVEMIR to Tresiba 90 units daily in the evening and adjust every 3 days x 5 units until morning sugars at least under 150 If blood sugar in the mornings are below 100 and go down  by 10 units  NOVOLOG: Take this only before eating based on what you are eating and blood sugar level  For small meals take 10 units and larger meals take 20 units, take right before eating Additional insulin to be added at mealtime for high sugars:  Take additional 5 units for every 50 points over 150: Blood sugar 200+: Additional 10 units Blood sugar 250: Additional 15 units Blood sugar 300: Additional 20 units Blood sugar 400: Additional 25 units  If the blood sugar is high 2 to 3 hours after eating may take extra insulin only 50% of the above sliding scale  Avoid high fat foods, stop eating [place] fries and other fried foods as much as possible  For low blood sugars use at least 3-4 glucose tablets at a time  Schedule exam with eye doctor

## 2018-06-22 ENCOUNTER — Ambulatory Visit (HOSPITAL_COMMUNITY): Payer: Medicare HMO

## 2018-06-22 ENCOUNTER — Other Ambulatory Visit: Payer: Self-pay

## 2018-06-22 ENCOUNTER — Encounter (HOSPITAL_COMMUNITY): Payer: Self-pay

## 2018-06-22 DIAGNOSIS — I63512 Cerebral infarction due to unspecified occlusion or stenosis of left middle cerebral artery: Secondary | ICD-10-CM

## 2018-06-22 DIAGNOSIS — R2689 Other abnormalities of gait and mobility: Secondary | ICD-10-CM

## 2018-06-22 DIAGNOSIS — R278 Other lack of coordination: Secondary | ICD-10-CM

## 2018-06-22 NOTE — Therapy (Addendum)
[facility] [address] [place], [place], [address] Phone: [phone]   Fax:  [phone]  Physical Therapy Treatment/Progress Note  Patient Details  Name: Nathaniel [name] MRN: [id number] Date of Birth: August 10, 1986 Referring Provider (PT): Venancio Poisson, NP   Encounter Date: 06/22/2018   Progress Note Reporting Period 06/01/18 to 06/22/18  See note below for Objective Data and Assessment of Progress/Goals.     PT End of Session - 06/22/18 1809    Visit Number  13   PN completed visit #8   Number of Visits  17   mini reassess complete 06/01/18   Date for PT Re-Evaluation  07/06/18    Authorization Type  [employer] Medicare HMO (no visit limit, no auth required)    Authorization Time Period  05/11/18 - 17/0/01; second cert = 74/9/44-96/75/91    Authorization - Visit Number  1    Authorization - Number of Visits  10    PT Start Time  1652    PT Stop Time  1730    PT Time Calculation (min)  38 min    Equipment Utilized During Treatment  Gait belt    Activity Tolerance  Patient tolerated treatment well    Behavior During Therapy  WFL for tasks assessed/performed       Past Medical History:  Diagnosis Date  . Diabetes mellitus    lantus/novolog  . Hyperlipidemia   . Hypertension   . Marijuana abuse    occaisionally  . Noncompliance with medication regimen   . Stroke ([place])   . Tobacco abuse    5/day    Past Surgical History:  Procedure Laterality Date  . EYE SURGERY      There were no vitals filed for this visit.  Subjective Assessment - 06/22/18 1757    Subjective  Patient denies pain upon arrival and reports he is doing well. He states he had a visit with his endocrinolgist for diabetes management yesterday and they are going to change his medication.     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 Pt was at [facility] from 6/5-6/19 then discharged home and  received [facility] services    Limitations  Lifting;Standing;Walking;Writing;House hold activities    Patient Stated Goals  to be able to walk without a device    Currently in Pain?  No/denies         [facility] PT Assessment - 06/22/18 0001      Assessment   Medical Diagnosis  S/P left MCA stroke     Referring Provider (PT)  Venancio Poisson, NP    Onset Date/Surgical Date  02/16/18    Next MD Visit  --   seeign Dr. [name] again in 2 weeks   Prior Therapy  CIR at [facility], [facility] rehab      Precautions   Precautions  Fall      Restrictions   Weight Bearing Restrictions  No      Prior Function   Level of Independence  Needs assistance with ADLs    Vocation  On disability    Leisure  drawing-sketching, playing video games, playing with nephews      Cognition   Overall Cognitive Status  Within Functional Limits for tasks assessed      Functional Tests   Functional tests  Single leg stance      Single Leg Stance   Comments  R: 7sec L: 5 sec  was Rt LE = 4 seconds; Lt LE = 5 seconds     Strength   Right Hip Flexion  5/5   was 4   Right Hip Extension  4+/5   was 4   Right Hip ABduction  5/5   was 4   Left Hip Flexion  5/5   was 4+   Left Hip Extension  4+/5   was 4+   Left Hip ABduction  4+/5   was 4+   Right Knee Flexion  4+/5   was 4   Right Knee Extension  5/5   was 4   Left Knee Flexion  5/5   was 4+   Left Knee Extension  5/5   was 5   Right Ankle Dorsiflexion  5/5   was 4   Left Ankle Dorsiflexion  5/5   was 4+     Transfers   Five time sit to stand comments   18.6 sec, no UE   was 28.7 on 05/11/18; was 19.5 on 06/01/18     Ambulation/Gait   Ambulation/Gait  Yes    Ambulation/Gait Assistance  6: Modified independent (Device/Increase time)    Ambulation Distance (Feet)  --   2MWT   Assistive device  Straight cane    Gait Pattern  Step-through pattern;Decreased stride length;Right circumduction;Ataxic;Decreased hip/knee flexion - right;Poor foot clearance -  right    Ambulation Surface  Level;Indoor    Gait velocity  0.71 m/s    Stairs  Yes    Stairs Assistance  6: Modified independent (Device/Increase time)    Stair Management Technique  One rail Left    Number of Stairs  4    Height of Stairs  6      Standardized Balance Assessment   Standardized Balance Assessment  Dynamic Gait Index      Dynamic Gait Index   Level Surface  Mild Impairment    Change in Gait Speed  Mild Impairment    Gait with Horizontal Head Turns  Mild Impairment    Gait with Vertical Head Turns  Moderate Impairment    Gait and Pivot Turn  Moderate Impairment    Step Over Obstacle  Moderate Impairment    Step Around Obstacles  Mild Impairment    Steps  Mild Impairment    Total Score  13    DGI comment:  was 12/21 at eval on 05/11/18      Functional Gait  Assessment   Gait assessed   Yes    Gait Level Surface  Walks 20 ft in less than 7 sec but greater than 5.5 sec, uses assistive device, slower speed, mild gait deviations, or deviates 6-10 in outside of the 12 in walkway width.    Change in Gait Speed  Able to change speed, demonstrates mild gait deviations, deviates 6-10 in outside of the 12 in walkway width, or no gait deviations, unable to achieve a major change in velocity, or uses a change in velocity, or uses an assistive device.    Gait with Horizontal Head Turns  Performs head turns smoothly with slight change in gait velocity (eg, minor disruption to smooth gait path), deviates 6-10 in outside 12 in walkway width, or uses an assistive device.    Gait with Vertical Head Turns  Performs task with moderate change in gait velocity, slows down, deviates 10-15 in outside 12 in walkway width but recovers, can continue to walk.    Gait and Pivot Turn  Turns slowly, requires verbal  cueing, or requires several small steps to catch balance following turn and stop    Step Over Obstacle  Is able to step over one shoe box (4.5 in total height) but must slow down and adjust  steps to clear box safely. May require verbal cueing.    Gait with Narrow Base of Support  Ambulates 4-7 steps.    Gait with Eyes Closed  Walks 20 ft, slow speed, abnormal gait pattern, evidence for imbalance, deviates 10-15 in outside 12 in walkway width. Requires more than 9 sec to ambulate 20 ft.    Ambulating Backwards  Walks 20 ft, uses assistive device, slower speed, mild gait deviations, deviates 6-10 in outside 12 in walkway width.    Steps  Alternating feet, must use rail.    Total Score  15    FGA comment:  was 12/30 on 05/11/18       [facility] Adult PT Treatment/Exercise - 06/22/18 0001      Neuro Re-ed    Neuro Re-ed Details   Gait training: 200' gait with manual facilitation of bil arm swim to improve balance iwthout assitive device durign gait. Therapist 1 facilitated UE swimg with contralateral LE stepping using dowel that patient was holding onto. 2nd therapist) Ihor Austin, PTA) provided min guard assist for safety during gait training), patient was able to perform Rt hand turns during bout and have no overt LOB that would have required assistance.       Knee/Hip Exercises: Aerobic   Tread Mill  Gait trainer height '6\' 2"'  at 1.5 x 6 min; main cueing for increased Lt LE stride length; initially with bil UE support, transitioned to 30 sec bouts of 1 UE support with cue to facilitate contralateral arm swing (last 1 minute performed at 1.0 mph with no UE support and min assist/guard for safety.       PT Education - 06/22/18 1808    Education Details  educated on progress towards goals at this time and plan to continue for 2 more weeks with focus of coordination training and gait training.     Person(s) Educated  Patient    Methods  Explanation    Comprehension  Verbalized understanding       PT Short Term Goals - 06/22/18 1818      PT SHORT TERM GOAL #1   Title  Patient will be indepndent with HEP, updated PRN, to imrpvoe balance, coordination, and strength to improve  independence with functional mobility.     Time  3    Period  Weeks    Status  Achieved      PT SHORT TERM GOAL #2   Title  Patient will improve DGI by 4 points and FGA by 4 points to demonstrate significnat improvement in balance during ambulation.     Baseline  DGI improved by 1 point, FGA by 3 points    Time  3    Period  Weeks    Status  On-going      PT SHORT TERM GOAL #3   Title  Patient will improve MMT for all limited groups by 1/2 grade or greater and will improve 5x sit to stand by 6 seconds to demonstrate improved functional stregth and independence with mobilty.    Baseline  see MMT, 5xSTS improved by 9 sec    Time  3    Period  Weeks    Status  Partially Met        PT Long Term Goals -  06/22/18 1819      PT LONG TERM GOAL #1   Title  Patient will ambulate at 1.0 m/s during 2MWT with LRAD to improve community acces with ambulation and reduce fall risk based on gati velocity.    Baseline  0.71 m/s    Time  6    Period  Weeks    Status  On-going      PT LONG TERM GOAL #2   Title  Patient will improve DGI by 8 points (20/24) and FGA by 8 (20/30) points to demonstrate significnat improvement in balance during ambulation.     Baseline  DGI improved by 1 point, FGA by 3 points    Time  6    Period  Weeks    Status  On-going      PT LONG TERM GOAL #3   Title  Patient will perform SLS on bil LE for 30 seconds to demonstrate improved balance to improve quality and safety with giat and functional stair mobility.    Baseline  R: 7 sec, L: 5 sec    Time  6    Period  Weeks    Status  On-going        Plan - 06/22/18 1810    Clinical Impression Statement  Re-assessment performed this session and patient has made progress towards several goals. He is progressing gradually since last re-assessment and his greatest limitation remains coordination impairments. Testing indicate he is still at risk for falling based on FGA, DGI, gait velocity, and SLS. He has demonstrated  good improvements in step pattern and had decreased scissoring steps and improve step width throughout gait. He has made good progress with coordination/sequencing drills and interventions in recent therapy session and will benefit from ongoing skilled PT intervention to address coordination deficits and provide advanced HEP targeting this impairments. EOS he performed gait trainer with emphasis on UE swing and decreased external support. HE also performed overground training with manual facilitation of arm swing to LE stepping. This improved his stability with gait and he had no overt LOB. He will benefit from additional therapy to progress to advanced independent program and further reduce fall risk.     Rehab Potential  Good    PT Frequency  2x / week    PT Duration  6 weeks    PT Treatment/Interventions  ADLs/Self Care Home Management;Aquatic Therapy;Electrical Stimulation;DME Instruction;Gait training;Stair training;Functional mobility training;Therapeutic activities;Therapeutic exercise;Balance training;Neuromuscular re-education;Patient/family education;Manual techniques;Passive range of motion;Taping    PT Next Visit Plan  Continue with ladder drills for coordination and stepping drills with colored spots on floor. Continue gait with manual facilitation of arm swing. Patient does well with visual cues. Focus on sequencing and coordination drills/exercises that teach a reciprocal pattern for UE/LE.    PT Home Exercise Plan  Eval: Tandem and SLS at counter; 06/15/18 - side step at counter, SLS with vectors at counter    Consulted and Agree with Plan of Care  Patient    Family Member Consulted  patient's mother "Laverta Baltimore"       Patient will benefit from skilled therapeutic intervention in order to improve the following deficits and impairments:  Abnormal gait, Improper body mechanics, Decreased coordination, Decreased mobility, Decreased activity tolerance, Decreased endurance, Decreased strength,  Decreased balance, Difficulty walking  Visit Diagnosis: Acute ischemic left MCA stroke (HCC)  Other abnormalities of gait and mobility  Other lack of coordination     Problem List Patient Active Problem List   Diagnosis Date Noted  .  Stage 3 chronic kidney disease ([place])   . Hypoglycemia   . Neurosyphilis   . Mixed hyperlipidemia   . Acute ischemic left MCA stroke ([place]) 02/22/2018  . Abnormality of gait following cerebrovascular accident (CVA)   . Type 1 diabetes mellitus with peripheral circulatory complications ([place])   . Essential hypertension   . Dyslipidemia   . Syphilis   . DKA (diabetic ketoacidoses) ([place]) 02/16/2018  . CVA (cerebral vascular accident) ([place]) 02/16/2018  . Acute renal failure (ARF) ([place]) 10/21/2016  . Diabetic ketoacidosis without coma associated with type 1 diabetes mellitus ([place])   . Hyperbilirubinemia   . Hyponatremia 04/15/2016  . DM (diabetes mellitus) type 1, uncontrolled, with ketoacidosis ([place]) 04/15/2016  . Hyperglycemia 04/15/2016  . AKI (acute kidney injury) ([place]) 01/04/2016  . Malignant hypertension 01/04/2016  . Noncompliance with medications 01/04/2016  . Intractable nausea and vomiting 04/01/2015  . Gastroparesis 04/01/2015  . Type 1 diabetes mellitus with complication ([place]) 18/98/4210  . Chronic hypertension   . Nausea with vomiting   . Abscess, gluteal, right 06/21/2014  . Nausea and vomiting 04/08/2013  . Hyperglycemia without ketosis 04/08/2013  . Essential hypertension, benign 04/08/2013  . Hypercalcemia 07/29/2011  . Vomiting 07/28/2011  . Leukocytosis 07/28/2011  . Hypokalemia 07/28/2011  . DKA, type 1 ([place]) 07/27/2011  . Dehydration 07/27/2011  . Smoker 07/27/2011  . Marijuana abuse 07/27/2011    Kipp Brood, PT, DPT Physical Therapist with [facility]  06/22/2018 6:11 PM    [facility] [address] [place], [place], [address] Phone:  [phone]   Fax:  [phone]  Name: Nathaniel [name] MRN: [id number] Date of Birth: 1986/06/26

## 2018-06-26 ENCOUNTER — Other Ambulatory Visit: Payer: Self-pay

## 2018-06-26 ENCOUNTER — Encounter (HOSPITAL_COMMUNITY): Payer: Self-pay

## 2018-06-26 ENCOUNTER — Ambulatory Visit (HOSPITAL_COMMUNITY): Payer: Medicare HMO

## 2018-06-26 DIAGNOSIS — I63512 Cerebral infarction due to unspecified occlusion or stenosis of left middle cerebral artery: Secondary | ICD-10-CM

## 2018-06-26 DIAGNOSIS — R2689 Other abnormalities of gait and mobility: Secondary | ICD-10-CM

## 2018-06-26 DIAGNOSIS — R278 Other lack of coordination: Secondary | ICD-10-CM

## 2018-06-26 NOTE — Therapy (Signed)
[place], [place], [address] Phone: [phone]   Fax:  [phone]  Physical Therapy Treatment  Patient Details  Name: Nathaniel [name] MRN: [id number] Date of Birth: Dec 26, 1985 Referring Provider (PT): Venancio Poisson, NP   Encounter Date: 06/26/2018  PT End of Session - 06/26/18 1750    Visit Number  14   PN completed visit #8   Number of Visits  17   mini reassess complete 06/01/18   Date for PT Re-Evaluation  07/06/18    Authorization Type  [employer] Medicare HMO (no visit limit, no auth required)    Authorization Time Period  05/11/18 - 11/22/44; second cert = 56/8/12-75/17/00    Authorization - Visit Number  2    Authorization - Number of Visits  10    PT Start Time  1659   pt arrived late   PT Stop Time  1730    PT Time Calculation (min)  31 min    Equipment Utilized During Treatment  Gait belt    Activity Tolerance  Patient tolerated treatment well    Behavior During Therapy  [facility] for tasks assessed/performed       Past Medical History:  Diagnosis Date  . Diabetes mellitus    lantus/novolog  . Hyperlipidemia   . Hypertension   . Marijuana abuse    occaisionally  . Noncompliance with medication regimen   . Stroke ([place])   . Tobacco abuse    5/day    Past Surgical History:  Procedure Laterality Date  . EYE SURGERY      There were no vitals filed for this visit.  Subjective Assessment - 06/26/18 1749    Subjective  Patient denies pain today and denies falls this weekend. He reports he had a good weekend and spent time iwth his nieces and nephews.    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 Pt was at [facility] CIR from 6/5-6/19 then discharged home and received [facility] services    Limitations  Lifting;Standing;Walking;Writing;House hold activities    Patient Stated Goals  to be able to walk without a device    Currently in Pain?   No/denies        [facility] Adult PT Treatment/Exercise - 06/26/18 0001      Neuro Re-ed    Neuro Re-ed Details   Gait training: 400' gait with manual facilitation of bil arm swim to improve balance iwthout assitive device durign gait. Therapist 1 facilitated UE swimg with contralateral LE stepping using dowel that patient was holding onto. 2nd therapist) (Amy Mare Ferrari, PTA) provided min guard assist for safety during gait training), patient was able to perform Rt hand turns during bout and have no overt LOB that would have required assistance.       Exercises   Exercises  Knee/Hip      Knee/Hip Exercises: Standing   Rocker Board  2 minutes;Limitations    Rocker Board Limitations  2x 1 minutes lateral for symmetrical movement pattern    Other Standing Knee Exercises  Ladder drill for gait training: single foot to a box, for step length/width training without SPC; 6x RT.       Balance Exercises - 06/26/18 1800      Balance Exercises: Standing   Stepping Strategy  Anterior;Posterior   2x 1 minute to maintain midline   Step Over Hurdles / Cones  10x bil LE; Lateral stepping over 2 (6") hurdles  to tap colored spot on floor for coordination/step sequencing. 10x bil LE; forward stepping over 1 (6") hurdles to tap colored spot on floor for coordination/step sequencing.        PT Education - 06/26/18 1749    Education Details  Educated on exercises throughout and on sequencing.     Person(s) Educated  Patient    Methods  Explanation    Comprehension  Verbalized understanding       PT Short Term Goals - 06/22/18 1818      PT SHORT TERM GOAL #1   Title  Patient will be indepndent with HEP, updated PRN, to imrpvoe balance, coordination, and strength to improve independence with functional mobility.     Time  3    Period  Weeks    Status  Achieved      PT SHORT TERM GOAL #2   Title  Patient will improve DGI by 4 points and FGA by 4 points to demonstrate significnat improvement in balance  during ambulation.     Baseline  DGI improved by 1 point, FGA by 3 points    Time  3    Period  Weeks    Status  On-going      PT SHORT TERM GOAL #3   Title  Patient will improve MMT for all limited groups by 1/2 grade or greater and will improve 5x sit to stand by 6 seconds to demonstrate improved functional stregth and independence with mobilty.    Baseline  see MMT, 5xSTS improved by 9 sec    Time  3    Period  Weeks    Status  Partially Met        PT Long Term Goals - 06/22/18 1819      PT LONG TERM GOAL #1   Title  Patient will ambulate at 1.0 m/s during 2MWT with LRAD to improve community acces with ambulation and reduce fall risk based on gati velocity.    Baseline  0.71 m/s    Time  6    Period  Weeks    Status  On-going      PT LONG TERM GOAL #2   Title  Patient will improve DGI by 8 points (20/24) and FGA by 8 (20/30) points to demonstrate significnat improvement in balance during ambulation.     Baseline  DGI improved by 1 point, FGA by 3 points    Time  6    Period  Weeks    Status  On-going      PT LONG TERM GOAL #3   Title  Patient will perform SLS on bil LE for 30 seconds to demonstrate improved balance to improve quality and safety with giat and functional stair mobility.    Baseline  R: 7 sec, L: 5 sec    Time  6    Period  Weeks    Status  On-going        Plan - 06/26/18 1750    Clinical Impression Statement  Patient arrived late this session so therapy was limited today due to time constraints. Session focused on coordination training and balance activities. He continued with ladder drills for equal step width/length training using SPC. He also performed over-ground training with manual facilitation of arm swing to LE stepping. Rockerboard was added this session as well as patient has tendency to weight shift posteriorly and loose his balance; he required minimal cues to encourage weight shift anteriorly. He will benefit from additional therapy to  progress to advanced independent program and further reduce fall risk.    Rehab Potential  Good    PT Frequency  2x / week    PT Duration  6 weeks    PT Treatment/Interventions  ADLs/Self Care Home Management;Aquatic Therapy;Electrical Stimulation;DME Instruction;Gait training;Stair training;Functional mobility training;Therapeutic activities;Therapeutic exercise;Balance training;Neuromuscular re-education;Patient/family education;Manual techniques;Passive range of motion;Taping    PT Next Visit Plan  Continue with ladder drills for coordination and stepping drills with colored spots on floor. Continue gait with manual facilitation of arm swing. Patient does well with visual cues. Focus on sequencing and coordination drills/exercises that teach a reciprocal pattern for UE/LE. Add nustep at EOS for reciprocal pattern next session with UE/LE.    PT Home Exercise Plan  Eval: Tandem and SLS at counter; 06/15/18 - side step at counter, SLS with vectors at counter    Consulted and Agree with Plan of Care  Patient    Family Member Consulted  patient's mother "Laverta [place]"       Patient will benefit from skilled therapeutic intervention in order to improve the following deficits and impairments:  Abnormal gait, Improper body mechanics, Decreased coordination, Decreased mobility, Decreased activity tolerance, Decreased endurance, Decreased strength, Decreased balance, Difficulty walking  Visit Diagnosis: Acute ischemic left MCA stroke (HCC)  Other abnormalities of gait and mobility  Other lack of coordination     Problem List Patient Active Problem List   Diagnosis Date Noted  . Stage 3 chronic kidney disease ([place])   . Hypoglycemia   . Neurosyphilis   . Mixed hyperlipidemia   . Acute ischemic left MCA stroke ([place]) 02/22/2018  . Abnormality of gait following cerebrovascular accident (CVA)   . Type 1 diabetes mellitus with peripheral circulatory complications ([place])   . Essential hypertension   .  Dyslipidemia   . Syphilis   . DKA (diabetic ketoacidoses) ([place]) 02/16/2018  . CVA (cerebral vascular accident) ([place]) 02/16/2018  . Acute renal failure (ARF) ([place]) 10/21/2016  . Diabetic ketoacidosis without coma associated with type 1 diabetes mellitus ([place])   . Hyperbilirubinemia   . Hyponatremia 04/15/2016  . DM (diabetes mellitus) type 1, uncontrolled, with ketoacidosis ([place]) 04/15/2016  . Hyperglycemia 04/15/2016  . AKI (acute kidney injury) ([place]) 01/04/2016  . Malignant hypertension 01/04/2016  . Noncompliance with medications 01/04/2016  . Intractable nausea and vomiting 04/01/2015  . Gastroparesis 04/01/2015  . Type 1 diabetes mellitus with complication ([place]) 88/28/0034  . Chronic hypertension   . Nausea with vomiting   . Abscess, gluteal, right 06/21/2014  . Nausea and vomiting 04/08/2013  . Hyperglycemia without ketosis 04/08/2013  . Essential hypertension, benign 04/08/2013  . Hypercalcemia 07/29/2011  . Vomiting 07/28/2011  . Leukocytosis 07/28/2011  . Hypokalemia 07/28/2011  . DKA, type 1 ([place]) 07/27/2011  . Dehydration 07/27/2011  . Smoker 07/27/2011  . Marijuana abuse 07/27/2011    Kipp Brood, PT, DPT Physical Therapist with [facility]  06/26/2018 6:03 PM    [facility] [address] [place], [place], [address] Phone: [phone]   Fax:  [phone]  Name: Nathaniel [name] MRN: [id number] Date of Birth: 04-12-1986

## 2018-06-28 ENCOUNTER — Encounter: Payer: Medicare HMO | Attending: Physical Medicine & Rehabilitation | Admitting: Physical Medicine & Rehabilitation

## 2018-06-28 ENCOUNTER — Encounter: Payer: Self-pay | Admitting: Physical Medicine & Rehabilitation

## 2018-06-28 VITALS — BP 170/122 | HR 92 | Resp 14 | Ht 74.0 in | Wt 154.0 lb

## 2018-06-28 DIAGNOSIS — I63512 Cerebral infarction due to unspecified occlusion or stenosis of left middle cerebral artery: Secondary | ICD-10-CM

## 2018-06-28 DIAGNOSIS — Z8249 Family history of ischemic heart disease and other diseases of the circulatory system: Secondary | ICD-10-CM | POA: Insufficient documentation

## 2018-06-28 DIAGNOSIS — E1051 Type 1 diabetes mellitus with diabetic peripheral angiopathy without gangrene: Secondary | ICD-10-CM | POA: Diagnosis not present

## 2018-06-28 DIAGNOSIS — I10 Essential (primary) hypertension: Secondary | ICD-10-CM

## 2018-06-28 DIAGNOSIS — Z794 Long term (current) use of insulin: Secondary | ICD-10-CM | POA: Diagnosis not present

## 2018-06-28 DIAGNOSIS — I129 Hypertensive chronic kidney disease with stage 1 through stage 4 chronic kidney disease, or unspecified chronic kidney disease: Secondary | ICD-10-CM | POA: Diagnosis not present

## 2018-06-28 DIAGNOSIS — Z833 Family history of diabetes mellitus: Secondary | ICD-10-CM | POA: Diagnosis not present

## 2018-06-28 DIAGNOSIS — Z800 Family history of malignant neoplasm of digestive organs: Secondary | ICD-10-CM | POA: Insufficient documentation

## 2018-06-28 DIAGNOSIS — N181 Chronic kidney disease, stage 1: Secondary | ICD-10-CM | POA: Insufficient documentation

## 2018-06-28 DIAGNOSIS — F17210 Nicotine dependence, cigarettes, uncomplicated: Secondary | ICD-10-CM | POA: Insufficient documentation

## 2018-06-28 DIAGNOSIS — Z9114 Patient's other noncompliance with medication regimen: Secondary | ICD-10-CM | POA: Insufficient documentation

## 2018-06-28 DIAGNOSIS — Z09 Encounter for follow-up examination after completed treatment for conditions other than malignant neoplasm: Secondary | ICD-10-CM | POA: Diagnosis not present

## 2018-06-28 DIAGNOSIS — R531 Weakness: Secondary | ICD-10-CM | POA: Diagnosis not present

## 2018-06-28 DIAGNOSIS — E1010 Type 1 diabetes mellitus with ketoacidosis without coma: Secondary | ICD-10-CM | POA: Insufficient documentation

## 2018-06-28 DIAGNOSIS — Z841 Family history of disorders of kidney and ureter: Secondary | ICD-10-CM | POA: Insufficient documentation

## 2018-06-28 NOTE — Progress Notes (Signed)
Subjective:    Patient ID: Nathaniel [name], male    DOB: 01/16/1986, 32 y.o.   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  I last saw him in August.  He is now involved in outpatient therapies at any [facility]. He is progressing with his cane in therapy. No falls at home. He is independent with ADL's.  His blood pressure has been running a bit high. He sees endocrinology for his diabetes for his glycemic control. They also was given a catapres patch for better bp control. He has not put it on consistently because he doesn't know "where or how long to wear."  He has had drainage in his right eye  For a day and a half.   Pain Inventory Average Pain 0 Pain Right Now 0 My pain is no pain  In the last 24 hours, has pain interfered with the following? General activity 0 Relation with others 0 Enjoyment of life 0 What TIME of day is your pain at its worst? no pain Sleep (in general) Good  Pain is worse with: no pain Pain improves with: no pain Relief from Meds: no pain  Mobility walk with assistance use a walker ability to climb steps?  yes do you drive?  no Do you have any goals in this area?  yes  Function disabled: date disabled . Do you have any goals in this area?  yes  Neuro/Psych No problems in this area trouble walking  Prior Studies Any changes since last visit?  no  Physicians involved in your care Any changes since last visit?  no   Family History  Problem Relation Age of Onset  . Diabetes Father   . Kidney failure Father        last 4 mnths of life  . Hypertension Mother   . Diabetes Maternal Grandmother   . Pancreatic cancer Maternal Grandfather   . Diabetes Paternal Grandfather    Social History   Socioeconomic History  . Marital status: Single    Spouse name: Not on file  . Number of children: Not on file  . Years of education: Not on file  . Highest education level: Not on file  Occupational History   . Occupation: disabled  Social Needs  . Financial resource strain: Not on file  . Food insecurity:    Worry: Not on file    Inability: Not on file  . Transportation needs:    Medical: Not on file    Non-medical: Not on file  Tobacco Use  . Smoking status: Current Every Day Smoker    Packs/day: 0.25    Years: 8.00    Pack years: 2.00    Types: Cigarettes  . Smokeless tobacco: Never Used  . Tobacco comment: smoke 3 to 4 per day  Substance and Sexual Activity  . Alcohol use: No  . Drug use: Yes    Types: Marijuana    Comment: last use yesterday, every other days   . Sexual activity: Yes    Birth control/protection: Condom  Lifestyle  . Physical activity:    Days per week: Not on file    Minutes per session: Not on file  . Stress: Not on file  Relationships  . Social connections:    Talks on phone: Not on file    Gets together: Not on file    Attends religious service: Not on file    Active member of club or  organization: Not on file    Attends meetings of clubs or organizations: Not on file    Relationship status: Not on file  Other Topics Concern  . Not on file  Social History Narrative  . Not on file   Past Surgical History:  Procedure Laterality Date  . EYE SURGERY     Past Medical History:  Diagnosis Date  . Diabetes mellitus    lantus/novolog  . Hyperlipidemia   . Hypertension   . Marijuana abuse    occaisionally  . Noncompliance with medication regimen   . Stroke ([place])   . Tobacco abuse    5/day   BP (!) 170/122   Pulse 92   Resp 14   Ht 6\' 2"  (1.88 m) Comment: patient reports  Wt 154 lb (69.9 kg)   SpO2 97%   BMI 19.77 kg/m    Opioid Risk Score:   Fall Risk Score:  `1  Depression screen PHQ 2/9  No flowsheet data found.  Review of Systems  Constitutional: Negative.   HENT: Negative.   Eyes: Negative.   Respiratory: Negative.   Cardiovascular: Negative.   Gastrointestinal: Negative.   Endocrine:       Diabetes   Genitourinary:  Negative.   Musculoskeletal: Positive for gait problem.  Skin: Negative.   Allergic/Immunologic: Negative.   All other systems reviewed and are negative.      Objective:   Physical Exam  General: No acute distress HEENT: EOMI, oral membranes moist, right eyelid is swollen, sclera is slightly irritated.  There is no signs of physical irritant under the eyelid nor abnormal drainage. Cards: reg rate  Chest: normal effort Abdomen: Soft, NT, ND Skin: dry, intact Extremities: no edema  Neurological: He is alert and oriented to person, place, and time.  Improving strength right upper and right lower extremities.  Still has decreased light touch and proprioception in the right upper right lower extremity.  Walks with a bit of a steppage gait using his cane but balance overall much improved.  Strength is 5 out of 5 throughout in all 4 limbs currently.  Cognition and awareness near baseline. Skin: Skin is warm and dry.  Psychiatric: Affect pleasant and cooperative       Assessment & Plan:  1. Right sided weakness and sensory deficits secondary to bilateral CVA. -make referral to outpt therapies at [facility]            - continue with cane.   -Still no driving  2. T1DM with DKA: per endocrine  -seems like control is improving.  3.  Right eye irritation: Appears to be only irritated at this point.  I see no signs of infection.  Recommended use of eyedrops and observation only for now.  If drainage begins to develop he should call this office. 4. HTN: continue norvasc to 10mg   -CKD l -reinforced how catapres patch should be applied 5. Positive RPR/T Pallidum:  -s/p IV pen and bicillin per ID   15 minutes of face to face patient care time were spent during this visit. All questions were encouraged and answered.Follow up in  3 months.

## 2018-06-28 NOTE — Patient Instructions (Signed)
TRY TO SOME VISINE (OR SIMILAR) DROPS TO LUBRICATE YOUR RIGHT EYE. IF IT STARTS TO DEVELOP THICK DRAINAGE, YOU MIGHT NEED ANTIBIOTICS FOR IT (HOPEFULLY NOT).     CHANGE YOUR CATAPRES PATCH WEEKLY!!!

## 2018-06-29 ENCOUNTER — Ambulatory Visit (HOSPITAL_COMMUNITY): Payer: Medicare HMO

## 2018-06-29 ENCOUNTER — Encounter (HOSPITAL_COMMUNITY): Payer: Self-pay

## 2018-06-29 DIAGNOSIS — I63512 Cerebral infarction due to unspecified occlusion or stenosis of left middle cerebral artery: Secondary | ICD-10-CM | POA: Diagnosis not present

## 2018-06-29 DIAGNOSIS — R278 Other lack of coordination: Secondary | ICD-10-CM

## 2018-06-29 DIAGNOSIS — R2689 Other abnormalities of gait and mobility: Secondary | ICD-10-CM

## 2018-06-29 NOTE — Therapy (Signed)
[place], [place], [address] Phone: [phone]   Fax:  [phone]  Physical Therapy Treatment  Patient Details  Name: Nathaniel [name] MRN: [id number] Date of Birth: [birth year] Referring Provider (PT): Venancio Poisson, NP   Encounter Date: 06/29/2018  PT End of Session - 06/29/18 1654    Visit Number  15    Number of Visits  17    Date for PT Re-Evaluation  07/06/18   Minireassess complete visit #8 on 06/01/18   Authorization Type  [employer] Medicare HMO (no visit limit, no auth required)    Authorization Time Period  05/11/18 - 30/0/76; second cert = 22/6/33-35/45/62    Authorization - Visit Number  3    Authorization - Number of Visits  10    PT Start Time  5638    PT Stop Time  9373   5' on Nustep at EOS, not included in charges   PT Time Calculation (min)  45 min    Equipment Utilized During Treatment  Gait belt    Activity Tolerance  Patient tolerated treatment well    Behavior During Therapy  [facility] for tasks assessed/performed       Past Medical History:  Diagnosis Date  . Diabetes mellitus    lantus/novolog  . Hyperlipidemia   . Hypertension   . Marijuana abuse    occaisionally  . Noncompliance with medication regimen   . Stroke ([place])   . Tobacco abuse    5/day    Past Surgical History:  Procedure Laterality Date  . EYE SURGERY      There were no vitals filed for this visit.  Subjective Assessment - 06/29/18 1653    Subjective  Pt stated he is feeling good today no report of pain or fall.  Reports he went to MD yesterday and has change in insulin, has been checked sugar levels.      Patient Stated Goals  to be able to walk without a device    Currently in Pain?  No/denies                       [facility] Adult PT Treatment/Exercise - 06/29/18 0001      Neuro Re-ed    Neuro Re-ed Details   Gait training: 400' gait with manual facilitation of bil arm swim to improve  balance iwthout assitive device durign gait. Therapist 1 facilitated UE swimg with contralateral LE stepping using dowel that patient was holding onto. 2nd therapist) (Amy Mare Ferrari, PTA) provided min guard assist for safety during gait training), patient was able to perform Rt hand turns during bout and have no overt LOB that would have required assistance.       Exercises   Exercises  Knee/Hip      Knee/Hip Exercises: Aerobic   Nustep  hill L2 x 5' UE/LE for coordination      Knee/Hip Exercises: Standing   Rocker Board  2 minutes;Limitations    Rocker Board Limitations  2x 1 minutes lateral for symmetrical movement pattern    Other Standing Knee Exercises  Ladder drill for gait training: single foot to a box, for step length/width training without SPC; 6x RT.          Balance Exercises - 06/29/18 1744      Balance Exercises: Standing   Stepping Strategy  Anterior;Posterior   2x 1 minute to maintain midline   Step Over Hurdles / Cones  10x bil  LE; Lateral stepping over 2 (6") hurdles to tap colored spot on floor for coordination/step sequencing. 10x bil LE; forward stepping over 1 (6") hurdles to tap colored spot on floor for coordination/step sequencing.           PT Short Term Goals - 06/22/18 1818      PT SHORT TERM GOAL #1   Title  Patient will be indepndent with HEP, updated PRN, to imrpvoe balance, coordination, and strength to improve independence with functional mobility.     Time  3    Period  Weeks    Status  Achieved      PT SHORT TERM GOAL #2   Title  Patient will improve DGI by 4 points and FGA by 4 points to demonstrate significnat improvement in balance during ambulation.     Baseline  DGI improved by 1 point, FGA by 3 points    Time  3    Period  Weeks    Status  On-going      PT SHORT TERM GOAL #3   Title  Patient will improve MMT for all limited groups by 1/2 grade or greater and will improve 5x sit to stand by 6 seconds to demonstrate improved  functional stregth and independence with mobilty.    Baseline  see MMT, 5xSTS improved by 9 sec    Time  3    Period  Weeks    Status  Partially Met        PT Long Term Goals - 06/22/18 1819      PT LONG TERM GOAL #1   Title  Patient will ambulate at 1.0 m/s during 2MWT with LRAD to improve community acces with ambulation and reduce fall risk based on gati velocity.    Baseline  0.71 m/s    Time  6    Period  Weeks    Status  On-going      PT LONG TERM GOAL #2   Title  Patient will improve DGI by 8 points (20/24) and FGA by 8 (20/30) points to demonstrate significnat improvement in balance during ambulation.     Baseline  DGI improved by 1 point, FGA by 3 points    Time  6    Period  Weeks    Status  On-going      PT LONG TERM GOAL #3   Title  Patient will perform SLS on bil LE for 30 seconds to demonstrate improved balance to improve quality and safety with giat and functional stair mobility.    Baseline  R: 7 sec, L: 5 sec    Time  6    Period  Weeks    Status  On-going            Plan - 06/29/18 1731    Clinical Impression Statement  Session focus on balance training, sequence and coordination training.  Continued wiht manual facilitation of arm swing wiht use of dowel rods and additional therapist (min guard) for safey.  Pt able to demonstrate improved UE/LE sequence at EOS, encouraged to continue practicing over weekend with [facility]; verbalized understanding/agreement.  Resumed Nustep at EOS to improve UE/LE coordination to follow through with gait.    Rehab Potential  Good    PT Frequency  2x / week    PT Duration  6 weeks    PT Treatment/Interventions  ADLs/Self Care Home Management;Aquatic Therapy;Electrical Stimulation;DME Instruction;Gait training;Stair training;Functional mobility training;Therapeutic activities;Therapeutic exercise;Balance training;Neuromuscular re-education;Patient/family education;Manual techniques;Passive range of motion;Taping  PT Next Visit  Plan  Continue with ladder drills for coordination and stepping drills with colored spots on floor. Continue gait with manual facilitation of arm swing. Patient does well with visual cues. Focus on sequencing and coordination drills/exercises that teach a reciprocal pattern for UE/LE. Continue nustep for reciprocal pattern next session with UE/LE.    PT Home Exercise Plan  Eval: Tandem and SLS at counter; 06/15/18 - side step at counter, SLS with vectors at counter       Patient will benefit from skilled therapeutic intervention in order to improve the following deficits and impairments:  Abnormal gait, Improper body mechanics, Decreased coordination, Decreased mobility, Decreased activity tolerance, Decreased endurance, Decreased strength, Decreased balance, Difficulty walking  Visit Diagnosis: Acute ischemic left MCA stroke (HCC)  Other abnormalities of gait and mobility  Other lack of coordination     Problem List Patient Active Problem List   Diagnosis Date Noted  . Stage 3 chronic kidney disease ([place])   . Hypoglycemia   . Neurosyphilis   . Mixed hyperlipidemia   . Acute ischemic left MCA stroke ([place]) 02/22/2018  . Abnormality of gait following cerebrovascular accident (CVA)   . Type 1 diabetes mellitus with peripheral circulatory complications ([place])   . Essential hypertension   . Dyslipidemia   . Syphilis   . DKA (diabetic ketoacidoses) ([place]) 02/16/2018  . CVA (cerebral vascular accident) ([place]) 02/16/2018  . Acute renal failure (ARF) ([place]) 10/21/2016  . Diabetic ketoacidosis without coma associated with type 1 diabetes mellitus ([place])   . Hyperbilirubinemia   . Hyponatremia 04/15/2016  . DM (diabetes mellitus) type 1, uncontrolled, with ketoacidosis ([place]) 04/15/2016  . Hyperglycemia 04/15/2016  . AKI (acute kidney injury) ([place]) 01/04/2016  . Malignant hypertension 01/04/2016  . Noncompliance with medications 01/04/2016  . Intractable nausea and vomiting 04/01/2015  .  Gastroparesis 04/01/2015  . Type 1 diabetes mellitus with complication ([place]) 09/81/1914  . Chronic hypertension   . Nausea with vomiting   . Abscess, gluteal, right 06/21/2014  . Nausea and vomiting 04/08/2013  . Hyperglycemia without ketosis 04/08/2013  . Essential hypertension, benign 04/08/2013  . Hypercalcemia 07/29/2011  . Vomiting 07/28/2011  . Leukocytosis 07/28/2011  . Hypokalemia 07/28/2011  . DKA, type 1 ([place]) 07/27/2011  . Dehydration 07/27/2011  . Smoker 07/27/2011  . Marijuana abuse 07/27/2011   Ihor Austin, [place]; [place]  Aldona Lento 06/29/2018, 5:45 PM  [place] [address] [place], [place], [address] Phone: [phone]   Fax:  [phone]  Name: TREG DIEMER MRN: [id number] Date of Birth: 08/04/1986

## 2018-07-04 ENCOUNTER — Encounter (HOSPITAL_COMMUNITY): Payer: Self-pay | Admitting: Physical Therapy

## 2018-07-04 ENCOUNTER — Ambulatory Visit (HOSPITAL_COMMUNITY): Payer: Medicare HMO | Admitting: Physical Therapy

## 2018-07-04 DIAGNOSIS — R278 Other lack of coordination: Secondary | ICD-10-CM

## 2018-07-04 DIAGNOSIS — I63512 Cerebral infarction due to unspecified occlusion or stenosis of left middle cerebral artery: Secondary | ICD-10-CM | POA: Diagnosis not present

## 2018-07-04 DIAGNOSIS — R2689 Other abnormalities of gait and mobility: Secondary | ICD-10-CM

## 2018-07-04 NOTE — Therapy (Addendum)
[place], [place], [address] Phone: [phone]   Fax:  [phone]  Physical Therapy Treatment  Patient Details  Name: Nathaniel [name] MRN: [id number] Date of Birth: 01-27-1986 Referring Provider (PT): Venancio Poisson, NP   Encounter Date: 07/04/2018  PT End of Session - 07/04/18 1656    Visit Number  16    Number of Visits  17    Date for PT Re-Evaluation  07/06/18   Minireassess complete visit #8 on 06/01/18   Authorization Type  [employer] Medicare HMO (no visit limit, no auth required)    Authorization Time Period  05/11/18 - 84/5/36; second cert = 46/8/03-21/22/48    Authorization - Visit Number  4    Authorization - Number of Visits  10    PT Start Time  2500    PT Stop Time  1730    PT Time Calculation (min)  40 min    Equipment Utilized During Treatment  Gait belt    Activity Tolerance  Patient tolerated treatment well    Behavior During Therapy  [facility] for tasks assessed/performed       Past Medical History:  Diagnosis Date  . Diabetes mellitus    lantus/novolog  . Hyperlipidemia   . Hypertension   . Marijuana abuse    occaisionally  . Noncompliance with medication regimen   . Stroke ([place])   . Tobacco abuse    5/day    Past Surgical History:  Procedure Laterality Date  . EYE SURGERY      There were no vitals filed for this visit.  Subjective Assessment - 07/04/18 1652    Subjective  Patient reported that he has been doing all his exercises at home when able.     Patient Stated Goals  to be able to walk without a device    Currently in Pain?  No/denies                       [facility] Adult PT Treatment/Exercise - 07/04/18 0001      Exercises   Exercises  Knee/Hip      Knee/Hip Exercises: Standing   Rocker Board  2 minutes;Limitations    Rocker Board Limitations  2x 1 minutes lateral for symmetrical movement pattern    SLS with Vectors  5reps Bil LE, with single UE  support at counter, 3 way vector iwth 3 second holds     Other Standing Knee Exercises  Standing hip extension/Abduction each LE 2x10 with 2# ankle weight      Knee/Hip Exercises: Seated   Long Arc Quad  Strengthening;Right;2 sets;10 reps;Weights    Long Arc Quad Weight  2 lbs.   2# ankle weight   Marching  Right    Marching Limitations  Marching over 6'' hurdle x 20 for improved strength and proprioception of foot placement     Sit to stand from standard height chair with yellow weighted ball 1x15     Balance Exercises - 07/04/18 1707      Balance Exercises: Standing   Step Over Hurdles / Cones  10x bil LE; Lateral stepping over 2 (6") hurdles to tap colored spot on floor for coordination/step sequencing. 10x bil LE; forward stepping over 1 (6") hurdles to tap colored spot on floor for coordination/step sequencing.    With UE assistance       PT Education - 07/04/18 1655    Education Details  Described purpose and technique  of interventions throughout session.     Person(s) Educated  Patient    Methods  Explanation    Comprehension  Verbalized understanding       PT Short Term Goals - 06/22/18 1818      PT SHORT TERM GOAL #1   Title  Patient will be indepndent with HEP, updated PRN, to imrpvoe balance, coordination, and strength to improve independence with functional mobility.     Time  3    Period  Weeks    Status  Achieved      PT SHORT TERM GOAL #2   Title  Patient will improve DGI by 4 points and FGA by 4 points to demonstrate significnat improvement in balance during ambulation.     Baseline  DGI improved by 1 point, FGA by 3 points    Time  3    Period  Weeks    Status  On-going      PT SHORT TERM GOAL #3   Title  Patient will improve MMT for all limited groups by 1/2 grade or greater and will improve 5x sit to stand by 6 seconds to demonstrate improved functional stregth and independence with mobilty.    Baseline  see MMT, 5xSTS improved by 9 sec    Time  3     Period  Weeks    Status  Partially Met        PT Long Term Goals - 06/22/18 1819      PT LONG TERM GOAL #1   Title  Patient will ambulate at 1.0 m/s during 2MWT with LRAD to improve community acces with ambulation and reduce fall risk based on gati velocity.    Baseline  0.71 m/s    Time  6    Period  Weeks    Status  On-going      PT LONG TERM GOAL #2   Title  Patient will improve DGI by 8 points (20/24) and FGA by 8 (20/30) points to demonstrate significnat improvement in balance during ambulation.     Baseline  DGI improved by 1 point, FGA by 3 points    Time  6    Period  Weeks    Status  On-going      PT LONG TERM GOAL #3   Title  Patient will perform SLS on bil LE for 30 seconds to demonstrate improved balance to improve quality and safety with giat and functional stair mobility.    Baseline  R: 7 sec, L: 5 sec    Time  6    Period  Weeks    Status  On-going            Plan - 07/04/18 1748    Clinical Impression Statement  This session focused on improving patient's balance and functional lower extremity strengthening. Patient performed isolated strengthening exercises for bilateral hip extension and hip abduction in standing as well as added sit to stand exercise with overhead lifting of a yellow weighted ball to improve patient's balance and strength with functional activity. Also added seated hip flexion marching over 6'' hurdle with the right lower extremity to limit compensation with other joints in the seated position. Patient required verbal and tactile cueing throughout session for proper form with exercises throughout.     Rehab Potential  Good    PT Frequency  2x / week    PT Duration  6 weeks    PT Treatment/Interventions  ADLs/Self Care Home Management;Aquatic Therapy;Electrical Stimulation;DME Instruction;Gait training;Stair  training;Functional mobility training;Therapeutic activities;Therapeutic exercise;Balance training;Neuromuscular  re-education;Patient/family education;Manual techniques;Passive range of motion;Taping    PT Next Visit Plan  Re-assessment. Continue with ladder drills for coordination and stepping drills with colored spots on floor. Continue gait with manual facilitation of arm swing. Patient does well with visual cues. Focus on sequencing and coordination drills/exercises that teach a reciprocal pattern for UE/LE. Continue nustep for reciprocal pattern next session with UE/LE.    PT Home Exercise Plan  Eval: Tandem and SLS at counter; 06/15/18 - side step at counter, SLS with vectors at counter       Patient will benefit from skilled therapeutic intervention in order to improve the following deficits and impairments:  Abnormal gait, Improper body mechanics, Decreased coordination, Decreased mobility, Decreased activity tolerance, Decreased endurance, Decreased strength, Decreased balance, Difficulty walking  Visit Diagnosis: Acute ischemic left MCA stroke (HCC)  Other abnormalities of gait and mobility  Other lack of coordination     Problem List Patient Active Problem List   Diagnosis Date Noted  . Stage 3 chronic kidney disease ([place])   . Hypoglycemia   . Neurosyphilis   . Mixed hyperlipidemia   . Acute ischemic left MCA stroke ([place]) 02/22/2018  . Abnormality of gait following cerebrovascular accident (CVA)   . Type 1 diabetes mellitus with peripheral circulatory complications ([place])   . Essential hypertension   . Dyslipidemia   . Syphilis   . DKA (diabetic ketoacidoses) ([place]) 02/16/2018  . CVA (cerebral vascular accident) ([place]) 02/16/2018  . Acute renal failure (ARF) ([place]) 10/21/2016  . Diabetic ketoacidosis without coma associated with type 1 diabetes mellitus ([place])   . Hyperbilirubinemia   . Hyponatremia 04/15/2016  . DM (diabetes mellitus) type 1, uncontrolled, with ketoacidosis ([place]) 04/15/2016  . Hyperglycemia 04/15/2016  . AKI (acute kidney injury) ([place]) 01/04/2016  . Malignant  hypertension 01/04/2016  . Noncompliance with medications 01/04/2016  . Intractable nausea and vomiting 04/01/2015  . Gastroparesis 04/01/2015  . Type 1 diabetes mellitus with complication ([place]) 18/29/9371  . Chronic hypertension   . Nausea with vomiting   . Abscess, gluteal, right 06/21/2014  . Nausea and vomiting 04/08/2013  . Hyperglycemia without ketosis 04/08/2013  . Essential hypertension, benign 04/08/2013  . Hypercalcemia 07/29/2011  . Vomiting 07/28/2011  . Leukocytosis 07/28/2011  . Hypokalemia 07/28/2011  . DKA, type 1 ([place]) 07/27/2011  . Dehydration 07/27/2011  . Smoker 07/27/2011  . Marijuana abuse 07/27/2011    Clarene Critchley PT, DPT 5:50 PM, 07/04/18 [place], [place], [address] Phone: [phone]   Fax:  [phone]  Name: JAYTEN GABBARD MRN: [id number] Date of Birth: [birth year]

## 2018-07-06 ENCOUNTER — Other Ambulatory Visit: Payer: Self-pay

## 2018-07-06 ENCOUNTER — Encounter (HOSPITAL_COMMUNITY): Payer: Self-pay

## 2018-07-06 ENCOUNTER — Encounter: Payer: Self-pay | Admitting: Internal Medicine

## 2018-07-06 ENCOUNTER — Ambulatory Visit (INDEPENDENT_AMBULATORY_CARE_PROVIDER_SITE_OTHER): Payer: Medicare HMO | Admitting: Internal Medicine

## 2018-07-06 ENCOUNTER — Ambulatory Visit (HOSPITAL_COMMUNITY): Payer: Medicare HMO

## 2018-07-06 VITALS — BP 160/90 | HR 118 | Ht 74.0 in | Wt 159.8 lb

## 2018-07-06 DIAGNOSIS — R2689 Other abnormalities of gait and mobility: Secondary | ICD-10-CM

## 2018-07-06 DIAGNOSIS — E10649 Type 1 diabetes mellitus with hypoglycemia without coma: Secondary | ICD-10-CM | POA: Diagnosis not present

## 2018-07-06 DIAGNOSIS — I63512 Cerebral infarction due to unspecified occlusion or stenosis of left middle cerebral artery: Secondary | ICD-10-CM

## 2018-07-06 DIAGNOSIS — R278 Other lack of coordination: Secondary | ICD-10-CM

## 2018-07-06 DIAGNOSIS — E1065 Type 1 diabetes mellitus with hyperglycemia: Secondary | ICD-10-CM | POA: Diagnosis not present

## 2018-07-06 MED ORDER — INSULIN ASPART 100 UNIT/ML FLEXPEN: 6.0000 [IU] | PEN_INJECTOR | Freq: Three times a day (TID) | SUBCUTANEOUS | 11 refills | Status: DC

## 2018-07-06 MED ORDER — "PEN NEEDLES 3/16"" 31G X 5 MM MISC": 11 refills | Status: DC

## 2018-07-06 MED ORDER — INSULIN DEGLUDEC 200 UNIT/ML ~~LOC~~ SOPN: 18.0000 [IU] | PEN_INJECTOR | Freq: Every day | SUBCUTANEOUS | 1 refills | Status: DC

## 2018-07-06 NOTE — Progress Notes (Signed)
Name: Nathaniel [name]  Age/ Sex: 32 y.o., male   MRN/ DOB: [id number], Mar 20, 1986     PCP: [name], MD   Reason for Endocrinology Evaluation: Type 1 Diabetes Mellitus  Initial Endocrine Consultative Visit: 06/21/2018    PATIENT IDENTIFIER: [name] is a 32 y.o.  male with a past medical history of T1DM, HTN and CVA (right hemiparesis). The patient has followed with Endocrinology clinic since 06/21/2018 for consultative assistance with management of his diabetes.  DIABETIC HISTORY:  [name] was diagnosed with Type 1 DM at age 18, and has been on insulin ever since. . Hishemoglobin A1c has ranged from 9.3% in 2016, peaking at 12.8% in 2011.   SUBJECTIVE:   During the last visit (06/21/2018): His A1c was 10.2%. His insulin regimen was Levemir 60-70 units AM and 50 units QPM. He was also on Novolog BID 5-30 units. This was changed to Degludec 90 units daily with titration instructions and Novolog 10-20 units based on meal size.   Today (07/07/2018): [name] is here today accompanied by his Mother. Since starting the tresiba he has been having daily hypoglycemia associated with convulsions. His BG's as per mother were in 30's-40's. Resulting in a head injury yesterday. The patient is symptomatic with these episodes, with symptoms of feeling cold, clammy with slurred speech. Otherwise, the patient has not required any recent emergency interventions for hypoglycemia and has not had recent hospitalizations secondary to hyper or hypoglycemic episodes.   He has not been taking Novolog since starting [name] due to fear of hypoglycemia.  He checks his glucose on average once a day. Yesterday was 42 mg/dL   Patient corrects hypoglycemia by drinking a fruit juice followed by crackers at times.   ROS: As per HPI and as detailed below: Review of Systems  Constitutional: Positive  for malaise/fatigue. Negative for weight loss.  HENT: Negative for ear pain and sinus pain.   Eyes: Negative for double vision and pain.  Respiratory: Negative for cough and shortness of breath.   Cardiovascular: Negative for chest pain and palpitations.  Gastrointestinal: Negative for abdominal pain and nausea.  Genitourinary: Negative for frequency.  Skin:       Left forehead skin abrasion   Neurological: Positive for seizures and weakness. Negative for tingling.  Endo/Heme/Allergies: Negative for polydipsia.      HOME DIABETES REGIMEN:  Basal: Tresiba 96 units QHS Bolus: Novolog 10-20 units - Not taking  Supplemental: Not using    Daily aspirin: Yes Statin: Yes ACE-I/ARB: yes    METER DOWNLOAD SUMMARY: Did not bring meter   GLUCOSE LOG - Memory recall  Fasting 30's-40's mg/dL Sunday : 40, 51, 52      DIABETIC COMPLICATIONS: Microvascular complications:    Denies: Neuropathy  Last Eye Exam: Completed greater than 1 year ago  Macrovascular complications:   CVA  Denies: CAD, PVD   HISTORY:  Past Medical History:  Past Medical History:  Diagnosis Date  . Diabetes mellitus    lantus/novolog  . Hyperlipidemia   . Hypertension   . Marijuana abuse    occaisionally  . Noncompliance with medication regimen   . Stroke ([place])   . Tobacco abuse    5/day   Past Surgical History:  Past Surgical History:  Procedure Laterality Date  . EYE SURGERY      Social History:  reports that he has been smoking cigarettes. He has a 2.00 pack-year smoking history. He has never used smokeless tobacco. He reports that  he has current or past drug history. Drug: Marijuana. He reports that he does not drink alcohol. Family History:  Family History  Problem Relation Age of Onset  . Diabetes Father   . Kidney failure Father        last 4 mnths of life  . Hypertension Mother   . Diabetes Maternal Grandmother   . Pancreatic cancer Maternal Grandfather   . Diabetes  Paternal Grandfather      HOME MEDICATIONS: Allergies as of 07/06/2018      Reactions   Fructose Other (See Comments)   Increase of blood sugar      Medication List        Accurate as of 07/06/18 11:59 PM. Always use your most recent med list.          acetaminophen 325 MG tablet Commonly known as:  TYLENOL Take 1-2 tablets (325-650 mg total) by mouth every 4 (four) hours as needed for mild pain.   amLODipine 10 MG tablet Commonly known as:  NORVASC Take 1 tablet (10 mg total) by mouth daily.   aspirin 325 MG EC tablet TAKE 1 TABLET BY MOUTH EVERY DAY   atorvastatin 80 MG tablet Commonly known as:  LIPITOR Take 1 tablet (80 mg total) by mouth daily at 6 PM.   cloNIDine 0.1 mg/24hr patch Commonly known as:  CATAPRES - Dosed in mg/24 hr Place 1 patch (0.1 mg total) onto the skin once a week.   clopidogrel 75 MG tablet Commonly known as:  PLAVIX Take 75 mg by mouth daily.   insulin aspart 100 UNIT/ML FlexPen Commonly known as:  NOVOLOG Inject 6 Units into the skin 3 (three) times daily with meals.   Insulin Degludec 200 UNIT/ML Sopn Inject 18 Units into the skin daily.   lisinopril 20 MG tablet Commonly known as:  PRINIVIL,ZESTRIL Take 20 mg by mouth daily.   Pen Needles 3/16" 31G X 5 MM Misc Four times day        OBJECTIVE:   Vital Signs: BP (!) 160/90   Pulse (!) 118   Ht 6\' 2"  (1.88 m)   Wt 72.5 kg   BMI 20.52 kg/m    Exam: General: Pt appears well and is in NAD  Hydration: Well-hydrated with moist mucous membranes and good skin turgor  HEENT: Head: Unremarkable with good dentition. Oropharynx clear without exudate.  Eyes: External eye exam normal without stare, lid lag or exophthalmos.  EOM intact.  PERRL.  Neck: General: Supple without adenopathy. Thyroid: Thyroid size normal.  No goiter or nodules appreciated. No thyroid bruit.  Lungs: Clear with good BS bilat with no rales, rhonchi, or wheezes  Heart: RRR with normal S1 and S2 and no  gallops; no murmurs; no rub  Abdomen: Normoactive bowel sounds, soft, nontender, without masses or organomegaly palpable  Extremities: No pretibial edema. No tremor. Normal strength and motion throughout.   Skin: Patient with a 1 cm left forehead skin abrasion.  No Acanthosis nigricans/skin tags.   Neuro: MS is good with appropriate affect, pt is alert and Ox3      DATA REVIEWED: A1c:  Results for ZARIUS, FURR (MRN [id number]) as of 07/07/2018 16:21  Ref. Range 06/21/2018 13:05  Hemoglobin A1C Latest Ref Range: 4.0 - 5.6 % 10.2 (A)  Results for BROLIN, DAMBROSIA (MRN [id number]) as of 07/07/2018 16:21  Ref. Range 03/08/2018 06:02  Sodium Latest Ref Range: 135 - 145 mmol/L 137  Potassium Latest Ref Range: 3.5 - 5.1 mmol/L 4.2  Chloride Latest Ref Range: 101 - 111 mmol/L 108  CO2 Latest Ref Range: 22 - 32 mmol/L 22  Glucose Latest Ref Range: 65 - 99 mg/dL 278 (H)  BUN Latest Ref Range: 6 - 20 mg/dL 40 (H)  Creatinine Latest Ref Range: 0.61 - 1.24 mg/dL 1.99 (H)  Calcium Latest Ref Range: 8.9 - 10.3 mg/dL 9.0  Anion gap Latest Ref Range: 5 - 15  7  GFR, Est Non African American Latest Ref Range: >60 mL/min 43 (L)  GFR, Est African American Latest Ref Range: >60 mL/min 50 (L)      ASSESSMENT / PLAN / RECOMMENDATIONS:   1) Type 1 Diabetes Mellitus, poorly controlled, with microvascular and macrovascular complications - Most recent A1c of 10.2 %. Goal A1c < 7.0 %.    - Patient with recurrent hypoglycemia on Tresiba 96 units daily . He has not been prandial insulin - Discussed pharmacokinetics of basal/bolus insulin. I explained the importance of taking prandial insulin each time he eats - Patient advised to eat 3 consistent meals and avoid snacking.  - It seems that over the years he has been riding on his basal as he can not recall the last time he used Novolog and this is how he ends up on mega doses of basal insulin.  - I discussed with the patient that I will be starting  him weight base and will proceed from there. He was advised to call the office with BG's consistently > 300 or < 70 and NOT to wait until his next visit - he was also advised to bring glucose meter on next visit. Patient has not started using Freestyle Libre.     MEDICATIONS: -  Decrease Tresiba to 18 units QHS - Start Novolog 6 units TID QAC    EDUCATION / INSTRUCTIONS:  BG monitoring instructions: Patient is instructed to check his blood sugars 4 times a day, Before meals and at bedtime.  Call [facility] if: BG persistently < 70 or > 300. . I reviewed the Rule of 15 for the treatment of hypoglycemia in detail with the patient. Literature supplied.   3) Lipids: Patient is on a statin.    4) Hypertension: Above the goal of < 140/90 mmHg. Will defer to PCP.   5) Tobacco abuse: Over 3 minutes was spent counseling about tobacco cessation.   Signed electronically by: Mack Guise, MD  [facility] Endocrinology  [facility] [address], [place], [place] [address] Phone: [phone] FAX: [phone]    CC: [name], MD [place] [address] Phone: [phone]  Fax: [phone]  Return to Endocrinology clinic as below: Future Appointments  Date Time Provider [place]  07/10/2018  4:45 PM Debara Pickett, PT [facility]RE[facility] None  07/14/2018  5:30 PM Susy Frizzle, PTA [facility] None  07/17/2018  4:45 PM Debara Pickett, PT [facility]RE[facility] None  07/18/2018  3:30 PM Anne Shutter, RD [place] NDM  07/19/2018  4:00 PM Shammleffer, Melanie Crazier, MD [facility] None  07/20/2018  4:45 PM Susy Frizzle, PTA [facility] None  09/27/2018  3:00 PM Meredith Staggers, MD CPR[facility]PR[facility] CPR  10/16/2018  3:15 PM Venancio Poisson, NP [facility]NA[facility] None

## 2018-07-06 NOTE — Therapy (Signed)
[place], [place], [address] Phone: [phone]   Fax:  [phone]  Physical Therapy Treatment/Discharge Summary  Patient Details  Name: Nathaniel [name] MRN: [id number] Date of Birth: Apr 17, 1986 Referring Provider (PT): Venancio Poisson, NP   Encounter Date: 07/06/2018   PHYSICAL THERAPY DISCHARGE SUMMARY  Visits from Start of Care:   Current functional level related to goals / functional outcomes: Re-assessment performed today and patient has made excellent progress towards goals. He has met all short term goals and some of his long term goals He was able to ambulate at 1.0 m/s with SPC during 2MWT, he experienced 1 slight LOB but was able to correct his steps independently to regain stability. He has demonstrated improved balance with significant change in DGI and FGA score however remains at fall risk based on the testing. His greatest limitation remains with coordination and timing, HEP was updated today to provide exercises targeting this impairment. Based on his progress and functional status he will be discharged today following therapy. He has reported feeling confident with continuing on his own. 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     Remaining deficits: See details below   Education / Equipment:  Educated on progress towards goals and improvements in balance and gait this date. Educated patient on additional exercises for at home and provided handout on brain injury support group.   Plan: Patient agrees to discharge.  Patient goals were partially met. Patient is being discharged due to meeting the stated rehab goals.  ?????       PT End of Session - 07/06/18 1704    Visit Number  17    Number of Visits  17    Date for PT Re-Evaluation  07/06/18   Minireassess complete visit #8 on 06/01/18   Authorization Type  [employer] Medicare HMO (no visit limit, no  auth required)    Authorization Time Period  05/11/18 - 46/5/03; second cert = 54/6/56-81/27/51    Authorization - Visit Number  5    Authorization - Number of Visits  10    PT Start Time  7001    PT Stop Time  1729    PT Time Calculation (min)  39 min    Equipment Utilized During Treatment  Gait belt    Activity Tolerance  Patient tolerated treatment well    Behavior During Therapy  WFL for tasks assessed/performed       Past Medical History:  Diagnosis Date  . Diabetes mellitus    lantus/novolog  . Hyperlipidemia   . Hypertension   . Marijuana abuse    occaisionally  . Noncompliance with medication regimen   . Stroke ([place])   . Tobacco abuse    5/day    Past Surgical History:  Procedure Laterality Date  . EYE SURGERY      There were no vitals filed for this visit.  Subjective Assessment - 07/06/18 1800    Subjective  Patient reports he is feeling well. He states he went to the endocrinologist today and they have adjusted his insulin medication again as he recently has been having convulsions due to hypoglycemic episodes. He states he had one last night and hit his head against something but is not sure what. He denies any other injuries.     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 Pt was at [facility]  CIR from 6/5-6/19 then discharged home and received [facility] services    Patient Stated Goals  to be able to walk without a device    Currently in Pain?  No/denies         [facility] PT Assessment - 07/06/18 0001      Assessment   Medical Diagnosis  S/P left MCA stroke     Referring Provider (PT)  Venancio Poisson, NP    Onset Date/Surgical Date  02/16/18    Prior Therapy  CIR at [facility] rehab      Precautions   Precautions  Fall      Restrictions   Weight Bearing Restrictions  No      Cognition   Overall Cognitive Status  Within Functional Limits for tasks assessed      Functional Tests   Functional tests   Single leg stance      Single Leg Stance   Comments  R: 7sec L: 5 sec   was Rt LE = 4 seconds; Lt LE = 5 seconds     Strength   Right Hip Flexion  5/5   4   Right Hip Extension  5/5   4   Right Hip ABduction  5/5   4   Left Hip Flexion  5/5   4+   Left Hip Extension  5/5   4+   Left Hip ABduction  5/5   4+   Right Knee Flexion  5/5   4   Right Knee Extension  5/5   4   Left Knee Flexion  5/5   4+   Left Knee Extension  5/5   5   Right Ankle Dorsiflexion  5/5   4   Left Ankle Dorsiflexion  5/5   4+     Transfers   Five time sit to stand comments   21.3 no UE support   28.7     Ambulation/Gait   Ambulation/Gait  Yes    Ambulation/Gait Assistance  6: Modified independent (Device/Increase time)    Ambulation Distance (Feet)  392 Feet   2MWT   Assistive device  Straight cane    Gait Pattern  Step-through pattern;Decreased stride length;Right circumduction;Ataxic;Decreased hip/knee flexion - right;Poor foot clearance - right    Ambulation Surface  Level;Indoor    Gait velocity  1.0 m/s    Stairs  Yes    Stairs Assistance  6: Modified independent (Device/Increase time)    Stair Management Technique  One rail Left    Number of Stairs  4    Height of Stairs  6    Gait Comments  patient initiating Lt UE arm swimg with Rt LE during gait demonstrating good carryover from prior sessions      Standardized Balance Assessment   Standardized Balance Assessment  Dynamic Gait Index      Dynamic Gait Index   Level Surface  Mild Impairment    Change in Gait Speed  Mild Impairment    Gait with Horizontal Head Turns  Mild Impairment    Gait with Vertical Head Turns  Mild Impairment    Gait and Pivot Turn  Mild Impairment    Step Over Obstacle  Mild Impairment    Step Around Obstacles  Mild Impairment    Steps  Mild Impairment    Total Score  16    DGI comment:  was 12/21 at eval, patietn remains at risk for falls based on score      Functional Gait  Assessment   Gait  assessed   Yes    Gait Level Surface  Walks 20 ft in less than 7 sec but greater than 5.5 sec, uses assistive device, slower speed, mild gait deviations, or deviates 6-10 in outside of the 12 in walkway width.    Change in Gait Speed  Able to change speed, demonstrates mild gait deviations, deviates 6-10 in outside of the 12 in walkway width, or no gait deviations, unable to achieve a major change in velocity, or uses a change in velocity, or uses an assistive device.    Gait with Horizontal Head Turns  Performs head turns smoothly with slight change in gait velocity (eg, minor disruption to smooth gait path), deviates 6-10 in outside 12 in walkway width, or uses an assistive device.    Gait with Vertical Head Turns  Performs task with slight change in gait velocity (eg, minor disruption to smooth gait path), deviates 6 - 10 in outside 12 in walkway width or uses assistive device    Gait and Pivot Turn  Pivot turns safely in greater than 3 sec and stops with no loss of balance, or pivot turns safely within 3 sec and stops with mild imbalance, requires small steps to catch balance.    Step Over Obstacle  Is able to step over one shoe box (4.5 in total height) but must slow down and adjust steps to clear box safely. May require verbal cueing.    Gait with Narrow Base of Support  Ambulates less than 4 steps heel to toe or cannot perform without assistance.    Gait with Eyes Closed  Walks 20 ft, slow speed, abnormal gait pattern, evidence for imbalance, deviates 10-15 in outside 12 in walkway width. Requires more than 9 sec to ambulate 20 ft.    Ambulating Backwards  Walks 20 ft, uses assistive device, slower speed, mild gait deviations, deviates 6-10 in outside 12 in walkway width.    Steps  Alternating feet, must use rail.    Total Score  16    FGA comment:  was 12/30 at eval, patietn remains at risk for falls based on score       [facility] Adult PT Treatment/Exercise - 07/06/18 0001      Knee/Hip  Exercises: Standing   Other Standing Knee Exercises  stair taps with music for sequencing/timing: 20 reps on 6" step with Lt UE support    Other Standing Knee Exercises  Cane step over with intermittent counter support, 10 reps bil directions       PT Education - 07/06/18 1742    Education Details  Educated on progress towards goals and improvements in balance and gait this date. Educated patient on additional exercises for at home and provided handout on brain injury support group.     Person(s) Educated  Patient    Methods  Explanation    Comprehension  Verbalized understanding       PT Short Term Goals - 07/06/18 1648      PT SHORT TERM GOAL #1   Title  Patient will be indepndent with HEP, updated PRN, to imrpvoe balance, coordination, and strength to improve independence with functional mobility.     Time  3    Period  Weeks    Status  Achieved      PT SHORT TERM GOAL #2   Title  Patient will improve DGI by 4 points and FGA by 4 points to demonstrate significnat improvement in balance during ambulation.  Baseline  DGI improved by 1 point, FGA by 3 points    Time  3    Period  Weeks    Status  Achieved      PT SHORT TERM GOAL #3   Title  Patient will improve MMT for all limited groups by 1/2 grade or greater and will improve 5x sit to stand by 6 seconds to demonstrate improved functional stregth and independence with mobilty.    Baseline  see MMT, 5xSTS improved by 9 sec    Time  3    Period  Weeks    Status  Achieved        PT Long Term Goals - 07/06/18 1648      PT LONG TERM GOAL #1   Title  Patient will ambulate at 1.0 m/s during 2MWT with LRAD to improve community acces with ambulation and reduce fall risk based on gati velocity.    Baseline  0.71 m/s    Time  6    Period  Weeks    Status  Achieved      PT LONG TERM GOAL #2   Title  Patient will improve DGI by 8 points (20/24) and FGA by 8 (20/30) points to demonstrate significnat improvement in balance  during ambulation.     Baseline  DGI improved by 1 point, FGA by 3 points    Time  6    Period  Weeks    Status  Not Met      PT LONG TERM GOAL #3   Title  Patient will perform SLS on bil LE for 30 seconds to demonstrate improved balance to improve quality and safety with giat and functional stair mobility.    Baseline  R: 7 sec, L: 5 sec    Time  6    Period  Weeks    Status  Not Met        Plan - 07/06/18 1745    Clinical Impression Statement  Re-assessment performed today and patient has made excellent progress towards goals. He has met all short term goals and some of his long term goals He was able to ambulate at 1.0 m/s with SPC during 2MWT, he experienced 1 slight LOB but was able to correct his steps independently to regain stability. He has demonstrated improved balance with significant change in DGI and FGA score however remains at fall risk based on the testing. His greatest limitation remains with coordination and timing, HEP was updated today to provide exercises targeting this impairment. Based on his progress and functional status he will be discharged today following therapy. He has reported feeling confident with continuing on his own. 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     Rehab Potential  Good    PT Frequency  2x / week    PT Duration  6 weeks    PT Treatment/Interventions  ADLs/Self Care Home Management;Aquatic Therapy;Electrical Stimulation;DME Instruction;Gait training;Stair training;Functional mobility training;Therapeutic activities;Therapeutic exercise;Balance training;Neuromuscular re-education;Patient/family education;Manual techniques;Passive range of motion;Taping    PT Next Visit Plan  discharge this session.    PT Home Exercise Plan  Eval: Tandem and SLS at counter; 06/15/18 - side step at counter, SLS with vectors at counter; 07/06/18 - side step over cane/cones, stair taps    Consulted and Agree with Plan of Care   Patient;Family member/caregiver    Family Member Consulted  patient's mother "Laverta Baltimore"       Patient will benefit  from skilled therapeutic intervention in order to improve the following deficits and impairments:  Abnormal gait, Improper body mechanics, Decreased coordination, Decreased mobility, Decreased activity tolerance, Decreased endurance, Decreased strength, Decreased balance, Difficulty walking  Visit Diagnosis: Acute ischemic left MCA stroke (HCC)  Other abnormalities of gait and mobility  Other lack of coordination     Problem List Patient Active Problem List   Diagnosis Date Noted  . Stage 3 chronic kidney disease ([place])   . Hypoglycemia   . Neurosyphilis   . Mixed hyperlipidemia   . Acute ischemic left MCA stroke ([place]) 02/22/2018  . Abnormality of gait following cerebrovascular accident (CVA)   . Type 1 diabetes mellitus with peripheral circulatory complications ([place])   . Essential hypertension   . Dyslipidemia   . Syphilis   . DKA (diabetic ketoacidoses) ([place]) 02/16/2018  . CVA (cerebral vascular accident) ([place]) 02/16/2018  . Acute renal failure (ARF) ([place]) 10/21/2016  . Diabetic ketoacidosis without coma associated with type 1 diabetes mellitus ([place])   . Hyperbilirubinemia   . Hyponatremia 04/15/2016  . DM (diabetes mellitus) type 1, uncontrolled, with ketoacidosis ([place]) 04/15/2016  . Hyperglycemia 04/15/2016  . AKI (acute kidney injury) ([place]) 01/04/2016  . Malignant hypertension 01/04/2016  . Noncompliance with medications 01/04/2016  . Intractable nausea and vomiting 04/01/2015  . Gastroparesis 04/01/2015  . Type 1 diabetes mellitus with complication ([place]) 18/86/7737  . Chronic hypertension   . Nausea with vomiting   . Abscess, gluteal, right 06/21/2014  . Nausea and vomiting 04/08/2013  . Hyperglycemia without ketosis 04/08/2013  . Essential hypertension, benign 04/08/2013  . Hypercalcemia 07/29/2011  . Vomiting 07/28/2011  . Leukocytosis 07/28/2011   . Hypokalemia 07/28/2011  . DKA, type 1 ([place]) 07/27/2011  . Dehydration 07/27/2011  . Smoker 07/27/2011  . Marijuana abuse 07/27/2011    Kipp Brood, PT, DPT Physical Therapist with [facility]  07/06/2018 6:08 PM    [facility] [address] [place], [place], [address] Phone: [phone]   Fax:  [phone]  Name: Nathaniel [name] MRN: [id number] Date of Birth: Mar 01, 1986

## 2018-07-06 NOTE — Patient Instructions (Signed)
-   Decrease tresiba to 18 units at bedtime - Start Novolog at  6 units with meals - Check your sugar before each meal and at bedtime - Avoid sugar sweetened beverages and snacking.

## 2018-07-07 ENCOUNTER — Encounter (HOSPITAL_COMMUNITY): Payer: Self-pay

## 2018-07-07 ENCOUNTER — Emergency Department (HOSPITAL_COMMUNITY)
Admission: EM | Admit: 2018-07-07 | Discharge: 2018-07-07 | Disposition: A | Discharge status: Home / Self Care | Payer: Medicare HMO | Attending: Emergency Medicine | Admitting: Emergency Medicine

## 2018-07-07 DIAGNOSIS — E10649 Type 1 diabetes mellitus with hypoglycemia without coma: Secondary | ICD-10-CM | POA: Diagnosis not present

## 2018-07-07 DIAGNOSIS — E785 Hyperlipidemia, unspecified: Secondary | ICD-10-CM | POA: Diagnosis not present

## 2018-07-07 DIAGNOSIS — Z7982 Long term (current) use of aspirin: Secondary | ICD-10-CM | POA: Insufficient documentation

## 2018-07-07 DIAGNOSIS — I10 Essential (primary) hypertension: Secondary | ICD-10-CM | POA: Diagnosis not present

## 2018-07-07 DIAGNOSIS — R4189 Other symptoms and signs involving cognitive functions and awareness: Secondary | ICD-10-CM

## 2018-07-07 DIAGNOSIS — Z794 Long term (current) use of insulin: Secondary | ICD-10-CM | POA: Diagnosis not present

## 2018-07-07 DIAGNOSIS — Z7902 Long term (current) use of antithrombotics/antiplatelets: Secondary | ICD-10-CM | POA: Insufficient documentation

## 2018-07-07 DIAGNOSIS — Z8673 Personal history of transient ischemic attack (TIA), and cerebral infarction without residual deficits: Secondary | ICD-10-CM | POA: Diagnosis not present

## 2018-07-07 DIAGNOSIS — F17210 Nicotine dependence, cigarettes, uncomplicated: Secondary | ICD-10-CM | POA: Diagnosis not present

## 2018-07-07 DIAGNOSIS — Z79899 Other long term (current) drug therapy: Secondary | ICD-10-CM | POA: Insufficient documentation

## 2018-07-07 DIAGNOSIS — E11649 Type 2 diabetes mellitus with hypoglycemia without coma: Secondary | ICD-10-CM

## 2018-07-07 DIAGNOSIS — R4182 Altered mental status, unspecified: Secondary | ICD-10-CM | POA: Diagnosis present

## 2018-07-07 LAB — CBC WITH DIFFERENTIAL/PLATELET
ABS IMMATURE GRANULOCYTES: 0.06 10*3/uL (ref 0.00–0.07)
Basophils Absolute: 0.1 10*3/uL (ref 0.0–0.1)
Basophils Relative: 1 %
EOS ABS: 0.1 10*3/uL (ref 0.0–0.5)
Eosinophils Relative: 0 %
HCT: 37.6 % — ABNORMAL LOW (ref 39.0–52.0)
Hemoglobin: 11.7 g/dL — ABNORMAL LOW (ref 13.0–17.0)
Immature Granulocytes: 0 %
Lymphocytes Relative: 15 %
Lymphs Abs: 2.5 10*3/uL (ref 0.7–4.0)
MCH: 28.5 pg (ref 26.0–34.0)
MCHC: 31.1 g/dL (ref 30.0–36.0)
MCV: 91.7 fL (ref 80.0–100.0)
MONO ABS: 0.8 10*3/uL (ref 0.1–1.0)
MONOS PCT: 5 %
NEUTROS ABS: 13.2 10*3/uL — AB (ref 1.7–7.7)
Neutrophils Relative %: 79 %
PLATELETS: 387 10*3/uL (ref 150–400)
RBC: 4.1 MIL/uL — ABNORMAL LOW (ref 4.22–5.81)
RDW: 13.9 % (ref 11.5–15.5)
WBC: 16.6 10*3/uL — ABNORMAL HIGH (ref 4.0–10.5)
nRBC: 0 % (ref 0.0–0.2)

## 2018-07-07 LAB — CBG MONITORING, ED
Glucose-Capillary: 144 mg/dL — ABNORMAL HIGH (ref 70–99)
Glucose-Capillary: <10 mg/dL — CL (ref 70–99)
Glucose-Capillary: 89 mg/dL (ref 70–99)
Glucose-Capillary: 72 mg/dL (ref 70–99)

## 2018-07-07 LAB — BASIC METABOLIC PANEL
Anion gap: 10 (ref 5–15)
BUN: 15 mg/dL (ref 6–20)
CALCIUM: 9.7 mg/dL (ref 8.9–10.3)
CO2: 25 mmol/L (ref 22–32)
Chloride: 105 mmol/L (ref 98–111)
Creatinine, Ser: 1.64 mg/dL — ABNORMAL HIGH (ref 0.61–1.24)
GFR calc Af Amer: >60 mL/min (ref >60)
GFR, EST NON AFRICAN AMERICAN: 54 mL/min — AB (ref >60)
GLUCOSE: 18 mg/dL — AB (ref 70–99)
Potassium: 4.5 mmol/L (ref 3.5–5.1)
Sodium: 140 mmol/L (ref 135–145)

## 2018-07-07 MED ORDER — GLUCAGON HCL RDNA (DIAGNOSTIC) 1 MG IJ SOLR
INTRAMUSCULAR | Status: AC
  Filled 2018-07-07: qty 1

## 2018-07-07 MED ORDER — ACETAMINOPHEN 500 MG PO TABS
1000.0000 mg | ORAL_TABLET | Freq: Once | ORAL | Status: AC
  Administered 2018-07-07: 1000 mg via ORAL
  Filled 2018-07-07: qty 2

## 2018-07-07 MED ORDER — DEXTROSE 50 % IV SOLN
INTRAVENOUS | Status: AC
  Administered 2018-07-07: 50 mL
  Filled 2018-07-07: qty 50

## 2018-07-07 MED ORDER — SODIUM CHLORIDE 0.9 % IV BOLUS
1000.0000 mL | Freq: Once | INTRAVENOUS | Status: AC
  Administered 2018-07-07: 1000 mL via INTRAVENOUS

## 2018-07-07 NOTE — Progress Notes (Signed)
Thank you for the update! -Jess

## 2018-07-07 NOTE — ED Notes (Signed)
Have notified pt that he MUST eat to maintain his blood sugar. Pt states he will not eat peanut butter, but will eat graham crackers. Have given him graham crackers as well as more OJ. CBG is now 33

## 2018-07-07 NOTE — ED Notes (Signed)
Pt is currently getting dressed.

## 2018-07-07 NOTE — ED Notes (Signed)
Pt ambulated at a normal steady gait

## 2018-07-07 NOTE — ED Notes (Signed)
Pt alert and talking at this time. OJ provided

## 2018-07-07 NOTE — ED Triage Notes (Signed)
Pt brought by mother unresponsive . Blood sugar running in 30's incontinent of urine.

## 2018-07-07 NOTE — ED Notes (Signed)
Pt did not the whole amount of IV fluids

## 2018-07-07 NOTE — Discharge Instructions (Signed)
You were evaluated in the emergency department for an unresponsive episode with your blood sugar was 10.  You improved after you were given some IV sugar.  You seemed to be having more low blood sugar events since she started your new medication.  We recommend that you hold that medication until you are able to talk with your endocrinologist.  Please continue to check your sugars and eat frequent meals.  Return if any concerns.

## 2018-07-07 NOTE — ED Provider Notes (Signed)
[facility] EMERGENCY DEPARTMENT Provider Note   CSN: [id number] Arrival date & time: 07/07/18  1547     History   Chief Complaint Chief Complaint  Patient presents with  . Altered Mental Status    HPI Nathaniel [name] is a 32 y.o. male.  She arrives by private vehicle after being found by mother unresponsive at home.  He is a history of low blood sugars since medication change a few weeks ago.  He is an insulin-dependent diabetic.  Mom said his sugars were in the 30s and she try to give him some oral glucose.  Patient was removed from the vehicle brought into the resuscitation bay unresponsive with intact pulses.  He had some posturing motor activity.  His initial blood sugar here was less than 10.  We established 2 IVs and he was given glucagon and D50 with improvement in his mental status.  He is still slow to respond but is becoming more alert.  Level 5 caveat secondary to acuity of condition.  The history is provided by a parent. The history is limited by the condition of the patient.  Altered Mental Status   This is a recurrent problem. The current episode started less than 1 hour ago. The problem has not changed since onset.Associated symptoms include unresponsiveness. Risk factors include a change in prescription. His past medical history is significant for diabetes and CVA.    Past Medical History:  Diagnosis Date  . Diabetes mellitus    lantus/novolog  . Hyperlipidemia   . Hypertension   . Marijuana abuse    occaisionally  . Noncompliance with medication regimen   . Stroke ([place])   . Tobacco abuse    5/day    Patient Active Problem List   Diagnosis Date Noted  . Stage 3 chronic kidney disease ([place])   . Hypoglycemia   . Neurosyphilis   . Mixed hyperlipidemia   . Acute ischemic left MCA stroke ([place]) 02/22/2018  . Abnormality of gait following cerebrovascular accident (CVA)   . 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  Essential hypertension   . Dyslipidemia   . Syphilis   . DKA (diabetic ketoacidoses) ([place]) 02/16/2018  . CVA (cerebral vascular accident) ([place]) 02/16/2018  . Acute renal failure (ARF) ([place]) 10/21/2016  . Diabetic ketoacidosis without coma associated with type 1 diabetes mellitus ([place])   . Hyperbilirubinemia   . Hyponatremia 04/15/2016  . DM (diabetes mellitus) type 1, uncontrolled, with ketoacidosis ([place]) 04/15/2016  . Hyperglycemia 04/15/2016  . AKI (acute kidney injury) ([place]) 01/04/2016  . Malignant hypertension 01/04/2016  . Noncompliance with medications 01/04/2016  . Intractable nausea and vomiting 04/01/2015  . Gastroparesis 04/01/2015  . Type 1 diabetes mellitus with complication ([place]) 76/54/6503  . Chronic hypertension   . Nausea with vomiting   . Abscess, gluteal, right 06/21/2014  . Nausea and vomiting 04/08/2013  . Hyperglycemia without ketosis 04/08/2013  . Essential hypertension, benign 04/08/2013  . Hypercalcemia 07/29/2011  . Vomiting 07/28/2011  . Leukocytosis 07/28/2011  . Hypokalemia 07/28/2011  . DKA, type 1 ([place]) 07/27/2011  . Dehydration 07/27/2011  . Smoker 07/27/2011  . Marijuana abuse 07/27/2011    Past Surgical History:  Procedure Laterality Date  . EYE SURGERY          Home Medications    Prior to Admission medications   Medication Sig Start Date End Date Taking? Authorizing Provider  acetaminophen (TYLENOL) 325 MG tablet Take 1-2 tablets (325-650 mg total) by  mouth every 4 (four) hours as needed for mild pain. 03/07/18   Love, Ivan Anchors, PA-C  amLODipine (NORVASC) 10 MG tablet Take 1 tablet (10 mg total) by mouth daily. 03/08/18   Bary Leriche, PA-C  aspirin 325 MG EC tablet TAKE 1 TABLET BY MOUTH EVERY DAY 04/21/18   Meredith Staggers, MD  atorvastatin (LIPITOR) 80 MG tablet Take 1 tablet (80 mg total) by mouth daily at 6 PM. 03/07/18   Love, Ivan Anchors, PA-C  cloNIDine (CATAPRES - DOSED IN MG/24 HR) 0.1 mg/24hr patch Place 1 patch (0.1 mg total)  onto the skin once a week. 06/21/18   Elayne Snare, MD  clopidogrel (PLAVIX) 75 MG tablet Take 75 mg by mouth daily. 03/27/18   [provider]  insulin aspart (NOVOLOG) 100 UNIT/ML FlexPen Inject 6 Units into the skin 3 (three) times daily with meals. 07/06/18   Shammleffer, Melanie Crazier, MD  Insulin Degludec (TRESIBA FLEXTOUCH) 200 UNIT/ML SOPN Inject 18 Units into the skin daily. 07/06/18   Shammleffer, Melanie Crazier, MD  Insulin Pen Needle (PEN NEEDLES 3/16") 31G X 5 MM MISC Four times day 07/06/18   Shammleffer, Melanie Crazier, MD  lisinopril (PRINIVIL,ZESTRIL) 20 MG tablet Take 20 mg by mouth daily.    [provider]    Family History Family History  Problem Relation Age of Onset  . Diabetes Father   . Kidney failure Father        last 4 mnths of life  . Hypertension Mother   . Diabetes Maternal Grandmother   . Pancreatic cancer Maternal Grandfather   . Diabetes Paternal Grandfather     Social History Social History   Tobacco Use  . Smoking status: Current Every Day Smoker    Packs/day: 0.25    Years: 8.00    Pack years: 2.00    Types: Cigarettes  . Smokeless tobacco: Never Used  . Tobacco comment: smoke 3 to 4 per day  Substance Use Topics  . Alcohol use: No  . Drug use: Yes    Types: Marijuana    Comment: last use yesterday, every other days      Allergies   Fructose   Review of Systems Review of Systems  Unable to perform ROS: Acuity of condition     Physical Exam Updated Vital Signs BP (!) 195/121 (BP Location: Left Arm)   Pulse (!) 130   Resp 20   SpO2 100%   Physical Exam  Constitutional: He appears well-developed and well-nourished.  HENT:  Head: Normocephalic and atraumatic.  Eyes: Conjunctivae are normal.  Neck: No tracheal deviation present.  Cardiovascular: Regular rhythm. Tachycardia present.  No murmur heard. Pulmonary/Chest: Effort normal and breath sounds normal. No respiratory distress.  Abdominal: Soft. There  is no tenderness.  Musculoskeletal: He exhibits no edema, tenderness or deformity.  Neurological: He has normal strength. He is unresponsive. GCS eye subscore is 1. GCS verbal subscore is 2. GCS motor subscore is 4.  Skin: Skin is warm and dry.  Psychiatric: He has a normal mood and affect.  Nursing note and vitals reviewed.    ED Treatments / Results  Labs (all labs ordered are listed, but only abnormal results are displayed) Labs Reviewed  BASIC METABOLIC PANEL - Abnormal; Notable for the following components:      Result Value   Glucose, Bld 18 (*)    Creatinine, Ser 1.64 (*)    GFR calc non Af Amer 54 (*)    All other components within  normal limits  CBC WITH DIFFERENTIAL/PLATELET - Abnormal; Notable for the following components:   WBC 16.6 (*)    RBC 4.10 (*)    Hemoglobin 11.7 (*)    HCT 37.6 (*)    Neutro Abs 13.2 (*)    All other components within normal limits  CBG MONITORING, ED - Abnormal; Notable for the following components:   Glucose-Capillary <10 (*)    All other components within normal limits  CBG MONITORING, ED - Abnormal; Notable for the following components:   Glucose-Capillary 144 (*)    All other components within normal limits  CBG MONITORING, ED  CBG MONITORING, ED    EKG EKG Interpretation  Date/Time:  Friday July 07 2018 16:05:20 EDT Ventricular Rate:  125 PR Interval:    QRS Duration: 79 QT Interval:  287 QTC Calculation: 414 R Axis:   77 Text Interpretation:  Sinus tachycardia Borderline T wave abnormalities ST elev, probable normal early repol pattern similar pattern to prior 5/19 Confirmed by Aletta Edouard [id number]) on 07/07/2018 4:12:23 PM   Radiology No results found.  Procedures .Critical Care Performed by: Hayden Rasmussen, MD Authorized by: Hayden Rasmussen, MD   Critical care provider statement:    Critical care time (minutes):  30   Critical care time was exclusive of:  Separately billable procedures and treating  other patients   Critical care was necessary to treat or prevent imminent or life-threatening deterioration of the following conditions:  CNS failure or compromise and metabolic crisis   Critical care was time spent personally by me on the following activities:  Evaluation of patient's response to treatment, examination of patient, ordering and performing treatments and interventions, ordering and review of laboratory studies, ordering and review of radiographic studies, pulse oximetry, re-evaluation of patient's condition, obtaining history from patient or surrogate, review of old charts and development of treatment plan with patient or surrogate   I assumed direction of critical care for this patient from another provider in my specialty: no     (including critical care time)  Medications Ordered in ED Medications  dextrose 50 % solution (50 mLs  Given 07/07/18 1557)  acetaminophen (TYLENOL) tablet 1,000 mg (1,000 mg Oral Given 07/07/18 1644)  sodium chloride 0.9 % bolus 1,000 mL (0 mLs Intravenous Stopped 07/07/18 1808)     Initial Impression / Assessment and Plan / ED Course  I have reviewed the triage vital signs and the nursing notes.  Pertinent labs & imaging results that were available during my care of the patient were reviewed by me and considered in my medical decision making (see chart for details).   Patient with acute altered mental status and combativeness, requiring immediate action by ED team to hold down and get iv access to give life saving dextrose in a patient with a critical low blood sugar. He required multiple reevaluations to make sure his mental status improved back to baseline. Was able to eat and ambulate prior to discharge.   Clinical Course as of Jul 08 1305  Fri Jul 07, 2018  1644 Reevaluated patient.  He is now awake alert watching TV.  He is drank some juice and is eaten some crackers.  He is still tachycardic and so we will order some IV fluids.   [MB]      Clinical Course User Index [MB] Hayden Rasmussen, MD     Final Clinical Impressions(s) / ED Diagnoses   Final diagnoses:  Hypoglycemia associated with diabetes ([place])  Unresponsiveness  ED Discharge Orders    None       Hayden Rasmussen, MD 07/08/18 1310

## 2018-07-07 NOTE — ED Notes (Signed)
CRITICAL VALUE ALERT  Critical Value:  Glucose 18  Date & Time Notied:  07/07/18  Provider Notified: Dr. Melina Copa  Orders Received/Actions taken: no new orders

## 2018-07-10 ENCOUNTER — Ambulatory Visit (HOSPITAL_COMMUNITY): Payer: Medicare HMO

## 2018-07-14 ENCOUNTER — Ambulatory Visit (HOSPITAL_COMMUNITY): Payer: Medicare HMO

## 2018-07-17 ENCOUNTER — Ambulatory Visit (HOSPITAL_COMMUNITY): Payer: Medicare HMO

## 2018-07-18 ENCOUNTER — Encounter: Payer: Medicare HMO | Attending: Endocrinology | Admitting: *Deleted

## 2018-07-18 DIAGNOSIS — E1065 Type 1 diabetes mellitus with hyperglycemia: Secondary | ICD-10-CM | POA: Diagnosis present

## 2018-07-18 DIAGNOSIS — Z713 Dietary counseling and surveillance: Secondary | ICD-10-CM | POA: Diagnosis not present

## 2018-07-18 DIAGNOSIS — E1010 Type 1 diabetes mellitus with ketoacidosis without coma: Secondary | ICD-10-CM

## 2018-07-18 NOTE — Patient Instructions (Signed)
Plan:  Aim for 3 Carb Choices per meal (45 grams) +/- 1 either way  Aim for 0-2 Carbs per snack if hungry  Include protein in moderation with your meals and snacks Consider reading food labels for Total Carbohydrate of foods Continue with your activity level by walking or other exercises for 10-15 minutes daily as tolerated Consider checking BG at alternate times per day as directed by MD  Consider taking medication as directed by MD

## 2018-07-19 ENCOUNTER — Encounter: Payer: Self-pay | Admitting: Internal Medicine

## 2018-07-19 ENCOUNTER — Ambulatory Visit (INDEPENDENT_AMBULATORY_CARE_PROVIDER_SITE_OTHER): Payer: Medicare HMO | Admitting: Internal Medicine

## 2018-07-19 VITALS — BP 146/100 | HR 105 | Ht 74.0 in | Wt 165.2 lb

## 2018-07-19 DIAGNOSIS — E1065 Type 1 diabetes mellitus with hyperglycemia: Secondary | ICD-10-CM

## 2018-07-19 LAB — GLUCOSE, POCT (MANUAL RESULT ENTRY): POC GLUCOSE: 415 mg/dL — AB (ref 70–99)

## 2018-07-19 MED ORDER — LISINOPRIL 40 MG PO TABS: 40.0000 mg | ORAL_TABLET | Freq: Every day | ORAL | 3 refills | Status: DC

## 2018-07-19 MED ORDER — GLUCAGON (RDNA) 1 MG IJ KIT: 1.0000 mg | PACK | Freq: Once | INTRAMUSCULAR | 12 refills | Status: DC | PRN

## 2018-07-19 NOTE — Progress Notes (Signed)
Diabetes Self-Management Education  Visit Type: First/Initial  Appt. Start Time: 1530 Appt. End Time: 1700  07/19/2018  [name], identified by name and date of birth, is a 32 y.o. male with a diagnosis of Diabetes: Type 1. He is here with his mother who assists in his care. He has had a recent stroke, walks with a cane, can speak clearly but walks unsteadily. They report frequent hypoglycemia typically in the 40's with one reported at 10 mg/dl at the emergency department. He is now working with [place] office and has an appointment tomorrow for follow up.   ASSESSMENT  There were no vitals taken for this visit. There is no height or weight on file to calculate BMI.  Diabetes Self-Management Education - 07/19/18 1221      Visit Information   Visit Type  First/Initial      Initial Visit   Diabetes Type  Type 1    Are you currently following a meal plan?  No    Are you taking your medications as prescribed?  Yes    Date Diagnosed  2000      Health Coping   How would you rate your overall health?  Fair      Psychosocial Assessment   Patient Belief/Attitude about Diabetes  Other (comment)   hate it   Self-care barriers  Debilitated state due to current medical condition    Self-management support  Family;Friends    Other persons present  Patient;Parent    Patient Concerns  Nutrition/Meal planning;Glycemic Control    Special Needs  Simplified materials    Learning Readiness  Contemplating    How often do you need to have someone help you when you read instructions, pamphlets, or other written materials from your doctor or pharmacy?  1 - Never    What is the last grade level you completed in school?  11th      Pre-Education Assessment   Patient understands the diabetes disease and treatment process.  Needs Review    Patient understands incorporating nutritional management into lifestyle.  Needs Review    Patient undertands incorporating physical activity into  lifestyle.  Needs Review    Patient understands using medications safely.  Needs Review    Patient understands monitoring blood glucose, interpreting and using results  Needs Review    Patient understands prevention, detection, and treatment of acute complications.  Demonstrates understanding / competency    Patient understands prevention, detection, and treatment of chronic complications.  Needs Review    Patient understands how to develop strategies to address psychosocial issues.  Needs Review    Patient understands how to develop strategies to promote health/change behavior.  Needs Review      Complications   How often do you check your blood sugar?  1-2 times/day    Fasting Blood glucose range (mg/dL)  >200;70-129;<70;130-179;180-200    Postprandial Blood glucose range (mg/dL)  >200;<70;70-129;130-179;180-200    Number of hypoglycemic episodes per month  30    Can you tell when your blood sugar is low?  No    Have you had a dilated eye exam in the past 12 months?  No    Have you had a dental exam in the past 12 months?  No    Are you checking your feet?  Yes    How many days per week are you checking your feet?  4      Dietary Intake   Breakfast  sleeps until noon usually  Lunch  large frozen meal such as [employer] or [employer] OR hot meal his mother prepares with meat, starch and vegetables    Dinner  breakfast food occasionally such as oatmeal or cereal OR another frozen dinner    Snack (evening)  noodles, graham crackers, PNB crackers or a sandwich/burger    Beverage(s)  diet soda, unsweet tea, water occasionally      Exercise   Exercise Type  Light (walking / raking leaves)   walks to store, or other places   How many days per week to you exercise?  7    How many minutes per day do you exercise?  10    Total minutes per week of exercise  70      Patient Education   Previous Diabetes Education  Yes (please comment)   early years   Disease state   Factors that  contribute to the development of diabetes;Definition of diabetes, type 1 and 2, and the diagnosis of diabetes    Nutrition management   Role of diet in the treatment of diabetes and the relationship between the three main macronutrients and blood glucose level;Carbohydrate counting;Reviewed blood glucose goals for pre and post meals and how to evaluate the patients' food intake on their blood glucose level.;Food label reading, portion sizes and measuring food.;Meal timing in regards to the patients' current diabetes medication.    Physical activity and exercise   Identified with patient nutritional and/or medication changes necessary with exercise.    Medications  Reviewed patients medication for diabetes, action, purpose, timing of dose and side effects.    Monitoring  Identified appropriate SMBG and/or A1C goals.    Acute complications  Taught treatment of hypoglycemia - the 15 rule.    Psychosocial adjustment  Worked with patient to identify barriers to care and solutions;Role of stress on diabetes;Helped patient identify a support system for diabetes management      Individualized Goals (developed by patient)   Nutrition  General guidelines for healthy choices and portions discussed;Adjust meds/carbs with exercise as discussed    Physical Activity  Exercise 3-5 times per week    Medications  take my medication as prescribed    Monitoring   test blood glucose pre and post meals as discussed      Post-Education Assessment   Patient understands the diabetes disease and treatment process.  Demonstrates understanding / competency    Patient understands incorporating nutritional management into lifestyle.  Needs Review    Patient undertands incorporating physical activity into lifestyle.  Demonstrates understanding / competency    Patient understands using medications safely.  Needs Review    Patient understands monitoring blood glucose, interpreting and using results  Needs Review    Patient  understands prevention, detection, and treatment of acute complications.  Needs Review    Patient understands prevention, detection, and treatment of chronic complications.  Demonstrates understanding / competency    Patient understands how to develop strategies to address psychosocial issues.  Needs Review    Patient understands how to develop strategies to promote health/change behavior.  Needs Review      Outcomes   Expected Outcomes  Demonstrated interest in learning. Expect positive outcomes    Future DMSE  PRN    Program Status  Not Completed       Individualized Plan for Diabetes Self-Management Training:   Learning Objective:  Patient will have a greater understanding of diabetes self-management. Patient education plan is to attend individual and/or group sessions per  assessed needs and concerns.  Upon getting ready to leave, patient became unsteady on his feet. He did not have his meter with him. I checked his BG with our meter: 28 mg/dl. We had patient sit down immediately and provided a regular Dr. Malachi Bonds in 12 oz can. He drank the whole can due to the very low BG, and in 15 minutes he came up to 68 mg/dl. He was feeling much better, communicating appropriately and steady on his feet. His mother stated they had OJ in the car and she does all of the driving. They both decided to leave.   Plan:   Patient Instructions  Plan:  Aim for 3 Carb Choices per meal (45 grams) +/- 1 either way  Aim for 0-2 Carbs per snack if hungry  Include protein in moderation with your meals and snacks Consider reading food labels for Total Carbohydrate of foods Continue with your activity level by walking or other exercises for 10-15 minutes daily as tolerated Consider checking BG at alternate times per day as directed by MD  Consider taking medication as directed by MD  Expected Outcomes:  Demonstrated interest in learning. Expect positive outcomes  Education material provided: Food label  handouts, A1C conversion sheet, Meal plan card, Support group flyer and Carbohydrate counting sheet, Insulin Action handout, Support Group flyer  If problems or questions, patient to contact team via:  Phone  Future DSME appointment: PRN

## 2018-07-19 NOTE — Patient Instructions (Addendum)
-   Tresiba 18 units daily - Novolog 6 units BEFORE each meal  - Check your sugars BEFORE you eat, then TAKE novolog                    - I will increase Lisinopril to 40 mg   - HOW TO TREAT LOW BLOOD SUGARS (Blood sugar LESS THAN 70 MG/DL)  Please follow the RULE OF 15 for the treatment of hypoglycemia treatment (when your (blood sugars are less than 70 mg/dL)    STEP 1: Take 15 grams of carbohydrates when your blood sugar is low, which includes:   3-4 GLUCOSE TABS  OR  3-4 OZ OF JUICE OR REGULAR SODA OR  ONE TUBE OF GLUCOSE GEL     STEP 2: RECHECK blood sugar in 15 MINUTES STEP 3: If your blood sugar is still low at the 15 minute recheck --> then, go back to STEP 1 and treat AGAIN with another 15 grams of carbohydrates.

## 2018-07-19 NOTE — Progress Notes (Signed)
Name: Nathaniel [name]  Age/ Sex: 32 y.o., male   MRN/ DOB: [id number], 1985-11-11     PCP: [name], MD   Reason for Endocrinology Evaluation: Type 1 Diabetes Mellitus  Initial Endocrine Consultative Visit: 06/21/2018    PATIENT IDENTIFIER: Nathaniel [name] is a 32 y.o.  male with a past medical history of T1DM, HTN and CVA (right hemiparesis). The patient has followed with Endocrinology clinic since 06/21/2018 for consultative assistance with management of his diabetes.  DIABETIC HISTORY:  Nathaniel [name] was diagnosed with Type 1 DM at age 88, and has been on insulin ever since. . Hishemoglobin A1c has ranged from 9.3% in 2016, peaking at 12.8% in 2011.   SUBJECTIVE:   During the last visit (07/06/2018): He presented with recurrent hypoglycemia associated with seizures. [name] has been reduced from 96 units to 18 units and encouraged to use Novolog starting at 6 units with meals. Patient was riding on his basal insulin without use of prandial.   Today (07/20/2018): Nathaniel [name] is here today accompanied by his Mother. He has not followed any of the recommendations discussed last visit He continues to ride on his basal. Mother stated he doesn't take Novolog. Patient states he has been taking 10-15 units "based on how his body feels". He continues with hypoglycemia and loss of consciousness, last week ended up in the ED with BG of 10 mg/dL .The patient is symptomatic with these episodes, with symptoms of feeling cold, clammy with slurred speech. Otherwise, the patient has required recent emergency interventions for hypoglycemia and has had recent hospitalizations secondary to hypoglycemic episodes.    Patient corrects hypoglycemia by drinking a fruit juice followed by crackers at times.  This morning he ate pan cakes and a glass of juice stating withOUT prandial insulin, stating because his  glucose yesterday was low.   Last tresiba  Dose was 2 days ago of 50 units.    ROS: As per HPI and as detailed below: Review of Systems  Constitutional: Positive for malaise/fatigue. Negative for weight loss.  Eyes: Negative for double vision and pain.  Respiratory: Negative for cough and shortness of breath.   Cardiovascular: Negative for chest pain and palpitations.  Gastrointestinal: Negative for abdominal pain and nausea.  Genitourinary: Negative for frequency.  Neurological: Positive for seizures and weakness. Negative for tingling.  Endo/Heme/Allergies: Negative for polydipsia.      HOME DIABETES REGIMEN:  Basal: Tresiba 18 units QHS- He is taking 50 units  Bolus: Novolog 6 units- not taking     Daily aspirin: Yes Statin: Yes ACE-I/ARB: yes    METER DOWNLOAD SUMMARY: 10/17-10/30/19  Average 52 mg/dL SD 35 mg/dL Highest 148 mg/dL Lowest 28 mg/dL   80% of readings below 54 mg/dL  90% of readings below 90 mg/dL  10 % within target 70-180 mg/dL     DIABETIC COMPLICATIONS: Microvascular complications:    Denies: Neuropathy  Last Eye Exam: Completed greater than 1 year ago  Macrovascular complications:   CVA  Denies: CAD, PVD   HISTORY:  Past Medical History:  Past Medical History:  Diagnosis Date  . Diabetes mellitus    lantus/novolog  . Hyperlipidemia   . Hypertension   . Marijuana abuse    occaisionally  . Noncompliance with medication regimen   . Stroke ([place])   . Tobacco abuse    5/day   Past Surgical History:  Past Surgical History:  Procedure Laterality Date  . EYE SURGERY      Social  History:  reports that he has been smoking cigarettes. He has a 2.00 pack-year smoking history. He has never used smokeless tobacco. He reports that he has current or past drug history. Drug: Marijuana. He reports that he does not drink alcohol. Family History:  Family History  Problem Relation Age of Onset  . Diabetes Father   . Kidney failure  Father        last 4 mnths of life  . Hypertension Mother   . Diabetes Maternal Grandmother   . Pancreatic cancer Maternal Grandfather   . Diabetes Paternal Grandfather      HOME MEDICATIONS: Allergies as of 07/19/2018      Reactions   Fructose Other (See Comments)   Increase of blood sugar      Medication List        Accurate as of 07/19/18 11:59 PM. Always use your most recent med list.          acetaminophen 325 MG tablet Commonly known as:  TYLENOL Take 1-2 tablets (325-650 mg total) by mouth every 4 (four) hours as needed for mild pain.   amLODipine 10 MG tablet Commonly known as:  NORVASC Take 1 tablet (10 mg total) by mouth daily.   aspirin 325 MG EC tablet TAKE 1 TABLET BY MOUTH EVERY DAY   atorvastatin 80 MG tablet Commonly known as:  LIPITOR Take 1 tablet (80 mg total) by mouth daily at 6 PM.   cloNIDine 0.1 mg/24hr patch Commonly known as:  CATAPRES - Dosed in mg/24 hr Place 1 patch (0.1 mg total) onto the skin once a week.   clopidogrel 75 MG tablet Commonly known as:  PLAVIX Take 75 mg by mouth daily.   glucagon 1 MG injection Inject 1 mg into the vein once as needed for up to 1 dose.   insulin aspart 100 UNIT/ML FlexPen Commonly known as:  NOVOLOG Inject 6 Units into the skin 3 (three) times daily with meals.   Insulin Degludec 200 UNIT/ML Sopn Inject 18 Units into the skin daily.   lisinopril 40 MG tablet Commonly known as:  PRINIVIL,ZESTRIL Take 1 tablet (40 mg total) by mouth daily.   Pen Needles 3/16" 31G X 5 MM Misc Four times day        OBJECTIVE:   Vital Signs: BP (!) 146/100 (BP Location: Left Arm, Patient Position: Sitting)   Pulse (!) 105   Ht 6\' 2"  (1.88 m)   Wt 165 lb 3.2 oz (74.9 kg)   SpO2 98%   BMI 21.21 kg/m    Exam: General: Pt appears well and is in NAD  Hydration: Well-hydrated with moist mucous membranes and good skin turgor  HEENT: Head: Unremarkable with good dentition. Oropharynx clear without  exudate.  Eyes: External eye exam normal without stare, lid lag or exophthalmos.  EOM intact.  PERRL.  Neck: General: Supple without adenopathy. Thyroid: Thyroid size normal.  No goiter or nodules appreciated. No thyroid bruit.  Lungs: Clear with good BS bilat with no rales, rhonchi, or wheezes  Heart: RRR with normal S1 and S2 and no gallops; no murmurs; no rub  Abdomen: Normoactive bowel sounds, soft, nontender, without masses or organomegaly palpable  Extremities: No pretibial edema. No tremor. Normal strength and motion throughout.   Skin: Patient with a 1 cm left forehead skin abrasion.  No Acanthosis nigricans/skin tags.   Neuro: MS is good with appropriate affect, pt is alert and Ox3      DATA REVIEWED:   Results for  JHONATHAN, DESROCHES (MRN [id number]) as of 07/07/2018 16:21  Ref. Range 06/21/2018 13:05  Hemoglobin A1C Latest Ref Range: 4.0 - 5.6 % 10.2 (A)  Results for BADR, PIEDRA (MRN [id number]) as of 07/07/2018 16:21  Ref. Range 03/08/2018 06:02  Sodium Latest Ref Range: 135 - 145 mmol/L 137  Potassium Latest Ref Range: 3.5 - 5.1 mmol/L 4.2  Chloride Latest Ref Range: 101 - 111 mmol/L 108  CO2 Latest Ref Range: 22 - 32 mmol/L 22  Glucose Latest Ref Range: 65 - 99 mg/dL 278 (H)  BUN Latest Ref Range: 6 - 20 mg/dL 40 (H)  Creatinine Latest Ref Range: 0.61 - 1.24 mg/dL 1.99 (H)  Calcium Latest Ref Range: 8.9 - 10.3 mg/dL 9.0  Anion gap Latest Ref Range: 5 - 15  7  GFR, Est Non African American Latest Ref Range: >60 mL/min 43 (L)  GFR, Est African American Latest Ref Range: >60 mL/min 50 (L)      ASSESSMENT / PLAN / RECOMMENDATIONS:   1) Type 1 Diabetes Mellitus, poorly controlled, with microvascular and macrovascular complications - Most recent A1c of 10.2 %. Goal A1c < 7.0 %.    - Patient has very poor insight in to his diabetes.  - Despite extensive discussion of pharmacokinetics of insulin and given written and verbal instructions on new insulin doses, he  somehow was taking 50 units of Tresiba and did not take the prescribed 18 units.  - He continues not to use Novolog as advised.  - I again explained the pharmacokinetics of basal/bolus insulin . And the importance of taking prandial insulin with each meal  - Patient has been to the ED with severe hypoglycemia with a BG of 10 mg/dL. I explained to him that death is a side effects of improper use of insulin.  - Patient advised to eat 3 consistent meals and avoid snacking.  - He continues to try riding on his basal, it may be safer for him to go off [name] if he continues to "wing it " with his dose.  -He and his mother were advised to call the office with BG's consistently > 300 or < 70 and NOT to wait until his next visit - Encouraged more frequent glucose checks.  - Patient and mother understand that he is at risk for hypoglycemia for another 2 days, due to long half life of tresiba.  - His office visit BG of 415 mg/dL is due to eating pancakes and juice with no prandial insulin.    MEDICATIONS: - Tresiba to 18 units QHS -  Novolog 6 units TID QAC    EDUCATION / INSTRUCTIONS:  BG monitoring instructions: Patient is instructed to check his blood sugars 3-4 times a day, Before meals and at bedtime.  Call [place] [facility] if: BG persistently < 70 or > 300. . I reviewed the Rule of 15 for the treatment of hypoglycemia in detail with the patient. Literature supplied.  Thirty minutes were spent with patient , > 50% was spent in answering question,  Counseling and educating the patient about his diabetes.    Signed electronically by: Mack Guise, MD  [facility] Endocrinology  [facility] [address] [place], [place] [address] Phone: [phone] FAX: [phone]    CC: [name], [place] [address] Phone: [phone]  Fax: [phone]  Return to Endocrinology clinic as below: Future Appointments    Date Time Provider [place]  07/26/2018  3:40  PM Shamleffer, Melanie Crazier, MD [facility] None  09/27/2018  3:00 PM Meredith Staggers, MD [facility] CPR  10/16/2018  3:15 PM Venancio Poisson, NP [facility] None

## 2018-07-20 ENCOUNTER — Ambulatory Visit (HOSPITAL_COMMUNITY): Payer: Medicare HMO

## 2018-07-25 NOTE — Progress Notes (Deleted)
Name: Nathaniel [name]  Age/ Sex: 32 y.o., male   MRN/ DOB: [id number], 1986-01-30     PCP: [name], MD   Reason for Endocrinology Evaluation: Type 1 Diabetes Mellitus  Initial Endocrine Consultative Visit: 06/21/2018    PATIENT IDENTIFIER: [name] is a 32 y.o.  male with a past medical history of T1DM, HTN and CVA (right hemiparesis). The patient has followed with Endocrinology clinic since 06/21/2018 for consultative assistance with management of his diabetes.  DIABETIC HISTORY:  Mr. Freiberger was diagnosed with Type 1 DM at age 51, and has been on insulin ever since. . Hishemoglobin A1c has ranged from 9.3% in 2016, peaking at 12.8% in 2011.   SUBJECTIVE:   During the last visit (07/19/2018): He presented with recurrent hypoglycemia again, he did not reduce his [name] to 18 units as advised on prior visit, he was taking [name] 50 units daily, he had not been taking Novolog (per mother) . He had another ED visit for a BG of 10 mg/dL. He was advised to reduce Tresiba to 18 units and start novolog at 6 units with each meal.       Today (07/26/2018): Mr. Hands is here today accompanied by his Mother. He has not followed any of the recommendations discussed last visit He continues to ride on his basal. Mother stated he doesn't take Novolog. Patient states he has been taking 10-15 units "based on how his body feels". Otherwise, the patient has required recent emergency interventions for hypoglycemia and has had recent hospitalizations secondary to hypoglycemic episodes.    Patient corrects hypoglycemia by drinking a fruit juice followed by crackers at times.  This morning he ate pan cakes and a glass of juice stating withOUT prandial insulin, stating because his glucose yesterday was low.     ROS: As per HPI and as detailed below: Review of Systems  Constitutional:  Positive for malaise/fatigue. Negative for weight loss.  Eyes: Negative for double vision and pain.  Respiratory: Negative for cough and shortness of breath.   Cardiovascular: Negative for chest pain and palpitations.  Gastrointestinal: Negative for abdominal pain and nausea.  Genitourinary: Negative for frequency.  Neurological: Positive for seizures and weakness. Negative for tingling.  Endo/Heme/Allergies: Negative for polydipsia.      HOME DIABETES REGIMEN:  Basal: Tresiba 18 units QHS- He is taking 50 units  Bolus: Novolog 6 units- not taking     Daily aspirin: Yes Statin: Yes ACE-I/ARB: yes    METER DOWNLOAD SUMMARY: 10/17-10/30/19  Average 52 mg/dL SD 35 mg/dL Highest 148 mg/dL Lowest 28 mg/dL   80% of readings below 54 mg/dL  90% of readings below 90 mg/dL  10 % within target 70-180 mg/dL     DIABETIC COMPLICATIONS: Microvascular complications:    Denies: Neuropathy  Last Eye Exam: Completed greater than 1 year ago  Macrovascular complications:   CVA  Denies: CAD, PVD   HISTORY:  Past Medical History:  Past Medical History:  Diagnosis Date  . Diabetes mellitus    lantus/novolog  . Hyperlipidemia   . Hypertension   . Marijuana abuse    occaisionally  . Noncompliance with medication regimen   . Stroke ([place])   . Tobacco abuse    5/day   Past Surgical History:  Past Surgical History:  Procedure Laterality Date  . EYE SURGERY      Social History:  reports that he has been smoking cigarettes. He has a 2.00 pack-year smoking history. He has never  used smokeless tobacco. He reports that he has current or past drug history. Drug: Marijuana. He reports that he does not drink alcohol. Family History:  Family History  Problem Relation Age of Onset  . Diabetes Father   . Kidney failure Father        last 4 mnths of life  . Hypertension Mother   . Diabetes Maternal Grandmother   . Pancreatic cancer Maternal Grandfather   . Diabetes Paternal  Grandfather      HOME MEDICATIONS: Allergies as of 07/26/2018      Reactions   Fructose Other (See Comments)   Increase of blood sugar      Medication List        Accurate as of 07/25/18  3:07 PM. Always use your most recent med list.          acetaminophen 325 MG tablet Commonly known as:  TYLENOL Take 1-2 tablets (325-650 mg total) by mouth every 4 (four) hours as needed for mild pain.   amLODipine 10 MG tablet Commonly known as:  NORVASC Take 1 tablet (10 mg total) by mouth daily.   aspirin 325 MG EC tablet TAKE 1 TABLET BY MOUTH EVERY DAY   atorvastatin 80 MG tablet Commonly known as:  LIPITOR Take 1 tablet (80 mg total) by mouth daily at 6 PM.   cloNIDine 0.1 mg/24hr patch Commonly known as:  CATAPRES - Dosed in mg/24 hr Place 1 patch (0.1 mg total) onto the skin once a week.   clopidogrel 75 MG tablet Commonly known as:  PLAVIX Take 75 mg by mouth daily.   glucagon 1 MG injection Inject 1 mg into the vein once as needed for up to 1 dose.   insulin aspart 100 UNIT/ML FlexPen Commonly known as:  NOVOLOG Inject 6 Units into the skin 3 (three) times daily with meals.   Insulin Degludec 200 UNIT/ML Sopn Inject 18 Units into the skin daily.   lisinopril 40 MG tablet Commonly known as:  PRINIVIL,ZESTRIL Take 1 tablet (40 mg total) by mouth daily.   Pen Needles 3/16" 31G X 5 MM Misc Four times day        OBJECTIVE:   Vital Signs: There were no vitals taken for this visit.   Exam: General: Pt appears well and is in NAD  Hydration: Well-hydrated with moist mucous membranes and good skin turgor  HEENT: Head: Unremarkable with good dentition. Oropharynx clear without exudate.  Eyes: External eye exam normal without stare, lid lag or exophthalmos.  EOM intact.  PERRL.  Neck: General: Supple without adenopathy. Thyroid: Thyroid size normal.  No goiter or nodules appreciated. No thyroid bruit.  Lungs: Clear with good BS bilat with no rales, rhonchi, or  wheezes  Heart: RRR with normal S1 and S2 and no gallops; no murmurs; no rub  Abdomen: Normoactive bowel sounds, soft, nontender, without masses or organomegaly palpable  Extremities: No pretibial edema. No tremor. Normal strength and motion throughout.   Skin: Patient with a 1 cm left forehead skin abrasion.  No Acanthosis nigricans/skin tags.   Neuro: MS is good with appropriate affect, pt is alert and Ox3      DATA REVIEWED:   Results for RYDGE, TEXIDOR (MRN [id number]) as of 07/07/2018 16:21  Ref. Range 06/21/2018 13:05  Hemoglobin A1C Latest Ref Range: 4.0 - 5.6 % 10.2 (A)     ASSESSMENT / PLAN / RECOMMENDATIONS:   1) Type 1 Diabetes Mellitus, poorly controlled, with microvascular and macrovascular complications - Most  recent A1c of 10.2 %. Goal A1c < 7.0 %.    - Patient has very poor insight in to his diabetes.  - Despite extensive discussion of pharmacokinetics of insulin and given written and verbal instructions on new insulin doses, he somehow was taking 50 units of Tresiba and did not take the prescribed 18 units.  - He continues not to use Novolog as advised.  - I again explained the pharmacokinetics of basal/bolus insulin . And the importance of taking prandial insulin with each meal  - Patient has been to the ED with severe hypoglycemia with a BG of 10 mg/dL. I explained to him that death is a side effects of improper use of insulin.  - Patient advised to eat 3 consistent meals and avoid snacking.  - He continues to try riding on his basal, it may be safer for him to go off [name] if he continues to "wing it " with his dose.  -He and his mother were advised to call the office with BG's consistently > 300 or < 70 and NOT to wait until his next visit - Encouraged more frequent glucose checks.  - Patient and mother understand that he is at risk for hypoglycemia for another 2 days, due to long half life of tresiba.  - His office visit BG of 415 mg/dL is due to eating  pancakes and juice with no prandial insulin.    MEDICATIONS: - Tresiba to 18 units QHS -  Novolog 6 units TID QAC    EDUCATION / INSTRUCTIONS:  BG monitoring instructions: Patient is instructed to check his blood sugars 3-4 times a day, Before meals and at bedtime.  Call [place] [facility] if: BG persistently < 70 or > 300. . I reviewed the Rule of 15 for the treatment of hypoglycemia in detail with the patient. Literature supplied.  Thirty minutes were spent with patient , > 50% was spent in answering question,  Counseling and educating the patient about his diabetes.    Signed electronically by: Mack Guise, MD  [facility] Endocrinology  [facility] [address] [place], [place] [address] Phone: [phone] FAX: [phone]    CC: [name], [place] [address] Phone: [phone]  Fax: [phone]  Return to Endocrinology clinic as below: Future Appointments  Date Time Provider [place]  07/26/2018  3:40 PM , Melanie Crazier, MD [facility] None  09/27/2018  3:00 PM Meredith Staggers, MD [facility] CPR  10/16/2018  3:15 PM Venancio Poisson, NP [facility] None

## 2018-07-26 ENCOUNTER — Ambulatory Visit: Payer: Medicare HMO | Admitting: Internal Medicine

## 2018-07-26 DIAGNOSIS — Z0289 Encounter for other administrative examinations: Secondary | ICD-10-CM

## 2018-07-27 ENCOUNTER — Telehealth: Payer: Self-pay

## 2018-07-27 NOTE — Telephone Encounter (Signed)
error 

## 2018-07-31 NOTE — Progress Notes (Signed)
Name: Nathaniel [name]  Age/ Sex: 32 y.o., male   MRN/ DOB: [id number], 09-20-1986     PCP: [name], MD   Reason for Endocrinology Evaluation: Type 1 Diabetes Mellitus  Initial Endocrine Consultative Visit: 06/21/2018    PATIENT IDENTIFIER: Nathaniel [name] is a 32 y.o.  male with a past medical history of T1DM, HTN and CVA (right hemiparesis). The patient has followed with Endocrinology clinic since 06/21/2018 for consultative assistance with management of his diabetes.  DIABETIC HISTORY:  Nathaniel [name] was diagnosed with Type 1 DM at age 66, and has been on insulin ever since. Marland Kitchen His hemoglobin A1c has ranged from 9.3% in 2016, peaking at 12.8% in 2011.   SUBJECTIVE:   During the last visit (07/19/2018): He presented with recurrent hypoglycemia again, he did not reduce his [name] to 18 units as advised on prior visit, he was taking [name] 50 units daily, he had not been taking Novolog (per mother) . He had another ED visit for a BG of 10 mg/dL. He was advised to reduce Tresiba to 18 units and start novolog at 6 units with each meal.       Today (08/01/2018): Nathaniel [name] is here today accompanied by his Mother.He has been checking glucose  Once daily. Since his last visit here, his PCP changed basal insulin from tresiba to Levemir 24 units daily. Patients has not started this yet, mother picked up prescription today (received tresiba and Levemir  From pharmacy today). Patient states 18 units of tresiba continued to cause hypoglycemia. He has been taking between 5-10 units of [name]. The prior day he took 5 units of [name]. He was planning on taking ??? 50 units of Levemir. He has been using 8 units of Novolog with supper. He continues to each a bag of chips for snack with no novolog coverage.  Otherwise, the patient has not required recent emergency interventions for hypoglycemia  and has not had recent hospitalizations secondary to hypoglycemic episodes.      ROS: As per HPI and as detailed below: Review of Systems  Constitutional: Negative for weight loss.  Eyes: Negative for double vision and pain.  Respiratory: Negative for cough and shortness of breath.   Cardiovascular: Negative for chest pain and palpitations.  Gastrointestinal: Negative for abdominal pain and nausea.  Neurological: Negative for seizures and headaches.      HOME DIABETES REGIMEN:  Basal: Tresiba 5-10 units QHS-  Bolus: Novolog 8 units- once a day   GLUCOSE LOG :  07/21/18   1:54 pm   84 11/2       10:16 am  82 11/3        5:17 am    53 11/5        1:04 pm   69 11/6        12:29 pm 117 11/7         1:18 pm   218 11/8         11:58 am  48  11/9         11:47 am  62  11/10        3:18 pm   87 11/11        2 pm        176    DIABETIC COMPLICATIONS: Microvascular complications:    Denies: Neuropathy  Last Eye Exam: Completed greater than 1 year ago  Macrovascular complications:   CVA  Denies: CAD, PVD   HISTORY:  Past Medical History:  Past Medical History:  Diagnosis Date  . Diabetes mellitus    lantus/novolog  . Hyperlipidemia   . Hypertension   . Marijuana abuse    occaisionally  . Noncompliance with medication regimen   . Stroke ([place])   . Tobacco abuse    5/day   Past Surgical History:  Past Surgical History:  Procedure Laterality Date  . EYE SURGERY      Social History:  reports that he has been smoking cigarettes. He has a 2.00 pack-year smoking history. He has never used smokeless tobacco. He reports that he has current or past drug history. Drug: Marijuana. He reports that he does not drink alcohol. Family History:  Family History  Problem Relation Age of Onset  . Diabetes Father   . Kidney failure Father        last 4 mnths of life  . Hypertension Mother   . Diabetes Maternal Grandmother   . Pancreatic cancer Maternal Grandfather   .  Diabetes Paternal Grandfather      HOME MEDICATIONS: Allergies as of 08/01/2018      Reactions   Fructose Other (See Comments)   Increase of blood sugar      Medication List        Accurate as of 08/01/18 11:59 PM. Always use your most recent med list.          acetaminophen 325 MG tablet Commonly known as:  TYLENOL Take 1-2 tablets (325-650 mg total) by mouth every 4 (four) hours as needed for mild pain.   amLODipine 10 MG tablet Commonly known as:  NORVASC Take 1 tablet (10 mg total) by mouth daily.   aspirin 325 MG EC tablet TAKE 1 TABLET BY MOUTH EVERY DAY   atorvastatin 80 MG tablet Commonly known as:  LIPITOR Take 1 tablet (80 mg total) by mouth daily at 6 PM.   cloNIDine 0.1 mg/24hr patch Commonly known as:  CATAPRES - Dosed in mg/24 hr Place 1 patch (0.1 mg total) onto the skin once a week.   clopidogrel 75 MG tablet Commonly known as:  PLAVIX Take 75 mg by mouth daily.   glucagon 1 MG injection Inject 1 mg into the vein once as needed for up to 1 dose.   insulin aspart 100 UNIT/ML FlexPen Commonly known as:  NOVOLOG Inject 6 Units into the skin 3 (three) times daily with meals.   Insulin Degludec 200 UNIT/ML Sopn Inject 18 Units into the skin daily.   Insulin Glargine 100 UNIT/ML Solostar Pen Commonly known as:  LANTUS Inject 15 Units into the skin daily.   lisinopril 40 MG tablet Commonly known as:  PRINIVIL,ZESTRIL Take 1 tablet (40 mg total) by mouth daily.   Pen Needles 3/16" 31G X 5 MM Misc Four times day        OBJECTIVE:   Vital Signs: BP (!) 136/92 (BP Location: Left Arm, Patient Position: Sitting, Cuff Size: Normal)   Pulse (!) 111   Ht 6\' 2"  (1.88 m)   Wt 167 lb 9.6 oz (76 kg)   SpO2 96%   BMI 21.52 kg/m    Exam: General: Pt appears well and is in NAD  Lungs: Clear with good BS bilat with no rales, rhonchi, or wheezes  Heart: RRR with normal S1 and S2 and no gallops; no murmurs; no rub  Extremities: No pretibial  edema. No tremor. Normal strength and motion throughout.   Neuro: MS is good with appropriate affect, pt is alert  and Ox3      DATA REVIEWED:   Results for Nathaniel [name], Nathaniel [name] (MRN [id number]) as of 07/07/2018 16:21  Ref. Range 06/21/2018 13:05  Hemoglobin A1C Latest Ref Range: 4.0 - 5.6 % 10.2 (A)     ASSESSMENT / PLAN / RECOMMENDATIONS:   1) Type 1 Diabetes Mellitus, poorly controlled, with microvascular and macrovascular complications - Most recent A1c of 10.2 %. Goal A1c < 7.0 %.     - Pt with poor literacy towards his diabetes.  - He continues to "guess on basal dosing: , states takes between 5-10 units daily of Tresiba. Explained to the patient that basa insulin takes ~ 4 days to reach a steady state. He was going to start Levemir today at 50 units (PCP ordered 24 units daily) - We again discussed the pharmacokinetics of basal/bolus  insulin and the importance of taking prandial insulin with meals.  - Patient advised to eat 3 consistent meals and avoid snacking.  - Encouraged patient to avoid sugar-sweetened beverages.  - Pt is uncomfortable with using tresiba, which is rightfully so, but I do not agree with starting Levemir as it doesn;t last 24 hrs, like other basal insulin, which would require the patient to inject twice a day, historically he has not been very good in giving himself multiple daily injections and I doubt he will be consistent with taking levemir twice a day in addition to novolog, which will increase his risk for DKA.  - I would recommend starting him on Lantus, will obtain authorization from his insurance company.  - In the meantime, and until he gets his Lantus , he will use the [name] as below    MEDICATIONS: - Tresiba 5 units daily -  Novolog 8 units with large meals  - Novolog 4 units with a small meal.    EDUCATION / INSTRUCTIONS:  BG monitoring instructions: Patient is instructed to check his blood sugars 3-4 times a day, Before meals and at  bedtime.  Call [facility] [facility] if: BG persistently < 70 or > 300. . I reviewed the Rule of 15 for the treatment of hypoglycemia in detail with the patient. Literature supplied.  F/u in 4 weeks    Signed electronically by: Mack Guise, MD  [facility] Endocrinology  [address] [place], [place] [address] Phone: [phone] FAX: [phone]    CC: [name], [place] [address] Phone: [phone]  Fax: [phone]  Return to Endocrinology clinic as below: Future Appointments  Date Time Provider [place]  08/29/2018  3:00 PM Shamleffer, Melanie Crazier, MD [facility] None  09/27/2018  3:00 PM Meredith Staggers, MD [facility] CPR  10/16/2018  3:15 PM Venancio Poisson, NP [facility] None

## 2018-08-01 ENCOUNTER — Encounter: Payer: Self-pay | Admitting: Internal Medicine

## 2018-08-01 ENCOUNTER — Ambulatory Visit (INDEPENDENT_AMBULATORY_CARE_PROVIDER_SITE_OTHER): Payer: Medicare HMO | Admitting: Internal Medicine

## 2018-08-01 VITALS — BP 136/92 | HR 111 | Ht 74.0 in | Wt 167.6 lb

## 2018-08-01 DIAGNOSIS — E1051 Type 1 diabetes mellitus with diabetic peripheral angiopathy without gangrene: Secondary | ICD-10-CM

## 2018-08-01 MED ORDER — INSULIN GLARGINE 100 UNIT/ML SOLOSTAR PEN: 15.0000 [IU] | PEN_INJECTOR | Freq: Every day | SUBCUTANEOUS | 3 refills | Status: DC

## 2018-08-01 NOTE — Patient Instructions (Addendum)
-   We will start the authorization process for Lantus  But until the following , take the following   - Tresiba 5 units daily  - Novolog 8 units with a large meal  - Novolog 4 units with small meal

## 2018-08-29 ENCOUNTER — Ambulatory Visit (INDEPENDENT_AMBULATORY_CARE_PROVIDER_SITE_OTHER): Payer: Medicare HMO | Admitting: Internal Medicine

## 2018-08-29 ENCOUNTER — Encounter: Payer: Self-pay | Admitting: Internal Medicine

## 2018-08-29 VITALS — BP 162/98 | HR 100 | Ht 74.0 in | Wt 169.2 lb

## 2018-08-29 DIAGNOSIS — E1051 Type 1 diabetes mellitus with diabetic peripheral angiopathy without gangrene: Secondary | ICD-10-CM

## 2018-08-29 MED ORDER — INSULIN GLARGINE 100 UNIT/ML SOLOSTAR PEN: 10.0000 [IU] | PEN_INJECTOR | Freq: Every day | SUBCUTANEOUS | 3 refills | Status: DC

## 2018-08-29 MED ORDER — GLUCAGON (RDNA) 1 MG IJ KIT: 1.0000 mg | PACK | Freq: Once | INTRAMUSCULAR | 12 refills | Status: DC | PRN

## 2018-08-29 MED ORDER — INSULIN ASPART 100 UNIT/ML FLEXPEN: 8.0000 [IU] | PEN_INJECTOR | Freq: Every day | SUBCUTANEOUS | 11 refills | Status: DC

## 2018-08-29 NOTE — Progress Notes (Signed)
Name: Nathaniel [name]  Age/ Sex: 32 y.o., male   MRN/ DOB: [id number], [birth year]     PCP: [name], MD   Reason for Endocrinology Evaluation: Type 1 Diabetes Mellitus  Initial Endocrine Consultative Visit: 06/21/2018    PATIENT IDENTIFIER: Nathaniel [name] is a 32 y.o.  male with a past medical history of T1DM, HTN and CVA (right hemiparesis). The patient has followed with Endocrinology clinic since 06/21/2018 for consultative assistance with management of his diabetes.  DIABETIC HISTORY:  Mr. Trice was diagnosed with Type 1 DM at age 31, and has been on insulin ever since. Marland Kitchen His hemoglobin A1c has ranged from 9.3% in 2016, peaking at 12.8% in 2011.   SUBJECTIVE:   During the last visit (08/01/2018): PCP had changed his basal insulin from [name] to Levemir , but this was not started yet by the patient. He was continuing on Tresiba, he was taking between 5-10 units of Tresiba , because the 18 units was causing hypoglycemia. I have advised him to take 5 units of Tresiba and 8 units of Novolog with a large meal and 4 units with a small meal. We also started prior authorization process for lantus      Today (08/01/2018): [name] is here today accompanied by his Mother.He has been checking glucose only when symptomatic for hypoglycemia. He has not been taking the [name] as advised at 5 units , he has been taking between 18-20 units daily. Pt continues with recurrent hypoglycemia, mother had to use glucagon since his last visit here for seizure due to hypoglycemia. He skipped [name] on his birthday and BG was "high " on meter. He states he stakes his Novolog when he eats, but he also states, he barely eats anything.   Otherwise, the patient has not required recent hospitalizations secondary to hypoglycemic episodes.      ROS: As per HPI and as detailed below: Review of  Systems  Constitutional: Negative for weight loss.  Eyes: Negative for double vision and pain.  Respiratory: Negative for cough and shortness of breath.   Cardiovascular: Negative for chest pain and palpitations.  Gastrointestinal: Negative for abdominal pain and nausea.  Neurological: Negative for seizures and headaches.      HOME DIABETES REGIMEN:  Basal: Tresiba 18-20 units QHS Bolus: Novolog 4 -8 units- once a day   GLUCOSE LOG :  12/10     7:45 am 35                11 AM 109  12/9        6:33 pm  51                  12:01 pm 47                   8:30 am   34 12/8          7: 37 am 214    HISTORY:  Past Medical History:  Past Medical History:  Diagnosis Date  . Diabetes mellitus    lantus/novolog  . Hyperlipidemia   . Hypertension   . Marijuana abuse    occaisionally  . Noncompliance with medication regimen   . Stroke ([place])   . Tobacco abuse    5/day   Past Surgical History:  Past Surgical History:  Procedure Laterality Date  . EYE SURGERY      Social History:  reports that he has been smoking cigarettes. He has  a 2.00 pack-year smoking history. He has never used smokeless tobacco. He reports that he has current or past drug history. Drug: Marijuana. He reports that he does not drink alcohol. Family History:  Family History  Problem Relation Age of Onset  . Diabetes Father   . Kidney failure Father        last 4 mnths of life  . Hypertension Mother   . Diabetes Maternal Grandmother   . Pancreatic cancer Maternal Grandfather   . Diabetes Paternal Grandfather      HOME MEDICATIONS: Allergies as of 08/29/2018      Reactions   Fructose Other (See Comments)   Increase of blood sugar      Medication List        Accurate as of 08/29/18  3:46 PM. Always use your most recent med list.          acetaminophen 325 MG tablet Commonly known as:  TYLENOL Take 1-2 tablets (325-650 mg total) by mouth every 4 (four) hours as needed for mild pain.     amLODipine 10 MG tablet Commonly known as:  NORVASC Take 1 tablet (10 mg total) by mouth daily.   aspirin 325 MG EC tablet TAKE 1 TABLET BY MOUTH EVERY DAY   atorvastatin 80 MG tablet Commonly known as:  LIPITOR Take 1 tablet (80 mg total) by mouth daily at 6 PM.   cloNIDine 0.1 mg/24hr patch Commonly known as:  CATAPRES - Dosed in mg/24 hr Place 1 patch (0.1 mg total) onto the skin once a week.   clopidogrel 75 MG tablet Commonly known as:  PLAVIX Take 75 mg by mouth daily.   glucagon 1 MG injection Inject 1 mg into the vein once as needed for up to 1 dose.   insulin aspart 100 UNIT/ML FlexPen Commonly known as:  NOVOLOG Inject 6 Units into the skin 3 (three) times daily with meals.   Insulin Degludec 200 UNIT/ML Sopn Inject 18 Units into the skin daily.   Insulin Glargine 100 UNIT/ML Solostar Pen Commonly known as:  LANTUS Inject 15 Units into the skin daily.   lisinopril 40 MG tablet Commonly known as:  PRINIVIL,ZESTRIL Take 1 tablet (40 mg total) by mouth daily.   Pen Needles 3/16" 31G X 5 MM Misc Four times day        OBJECTIVE:   Vital Signs: BP (!) 162/98 (BP Location: Left Arm, Patient Position: Sitting, Cuff Size: Large)   Pulse 100   Ht 6\' 2"  (1.88 m)   Wt 169 lb 3.2 oz (76.7 kg)   BMI 21.72 kg/m    Exam: General: Pt appears well and is in NAD  Lungs: Clear with good BS bilat with no rales, rhonchi, or wheezes  Heart: RRR with normal S1 and S2 and no gallops; no murmurs; no rub  Extremities: No pretibial edema. No tremor. Normal strength and motion throughout.   Neuro: MS is good with appropriate affect, pt is alert and Ox3      DATA REVIEWED:   Results for SANTOS, SOLLENBERGER (MRN [id number]) as of 07/07/2018 16:21  Ref. Range 06/21/2018 13:05  Hemoglobin A1C Latest Ref Range: 4.0 - 5.6 % 10.2 (A)     ASSESSMENT / PLAN / RECOMMENDATIONS:   1) Type 1 Diabetes Mellitus, poorly controlled, with microvascular and macrovascular  complications - Most recent A1c of 10.2 %. Goal A1c < 7.0 %.     - Pt with poor literacy towards his diabetes.  - He continues to "  guess on basal dosing" , states takes between 18-20 units daily of Tresiba, on his last visit he stated he takes between 5-10 units of tresiba. He does not follow any of the instruction.  I am truly not sure what he takes. - We again discussed the pharmacokinetics of basal/bolus  insulin and the importance of taking prandial insulin with meals.  - Patient advised to eat 3 consistent meals and avoid snacking.  - Encouraged patient to avoid sugar-sweetened beverages.  - We have obtained approval from his insurance company to start Lantus,  I have explained to him , the importance of taking Lantus daily as it is 24-hr medicine and if he skips a dose he is at risk for DKA.  - I am concerned about his lack of understanding of insulin use and dangering his life. I am also not sure if this is a comprehension problem vs trying to ride on his basal so he doesn't have to give himself prandial insulin.    MEDICATIONS: - Lantus 10units daily - Novolog 8 units with large meals  - Novolog 4 units with a small meal.    EDUCATION / INSTRUCTIONS:  BG monitoring instructions: Patient is instructed to check his blood sugars 3-4 times a day, Before meals and at bedtime.  Call [facility] [facility] if: BG persistently < 70 or > 300. . I reviewed the Rule of 15 for the treatment of hypoglycemia in detail with the patient. Literature supplied.  F/u in 2 weeks    Signed electronically by: Mack Guise, MD  [facility] Endocrinology  [facility] [address] [place], [place] [address] Phone: [phone] FAX: [phone]    CC: [name], [place] [address] Phone: [phone]  Fax: [phone]  Return to Endocrinology clinic as below: Future Appointments  Date Time Provider [place]    09/27/2018  3:00 PM Meredith Staggers, MD [facility] CPR  10/16/2018  3:15 PM Venancio Poisson, NP [facility] None

## 2018-08-29 NOTE — Patient Instructions (Signed)
-   STOP TRESIBA  - LANTUS 10 units ONCE A DAY  - NOVOLOG 8 units with a large meal, 4 units with a small meal  - HOW TO TREAT LOW BLOOD SUGARS (Blood sugar LESS THAN 70 MG/DL)  Please follow the RULE OF 15 for the treatment of hypoglycemia treatment (when your (blood sugars are less than 70 mg/dL)    STEP 1: Take 15 grams of carbohydrates when your blood sugar is low, which includes:   3-4 GLUCOSE TABS  OR  3-4 OZ OF JUICE OR REGULAR SODA OR  ONE TUBE OF GLUCOSE GEL     STEP 2: RECHECK blood sugar in 15 MINUTES STEP 3: If your blood sugar is still low at the 15 minute recheck --> then, go back to STEP 1 and treat AGAIN with another 15 grams of carbohydrates.

## 2018-08-30 ENCOUNTER — Encounter: Payer: Self-pay | Admitting: Internal Medicine

## 2018-08-30 MED ORDER — INSULIN ASPART 100 UNIT/ML FLEXPEN: 8.0000 [IU] | PEN_INJECTOR | Freq: Three times a day (TID) | SUBCUTANEOUS | 11 refills | Status: DC

## 2018-09-20 DIAGNOSIS — I639 Cerebral infarction, unspecified: Secondary | ICD-10-CM

## 2018-09-20 HISTORY — DX: Cerebral infarction, unspecified: I63.9

## 2018-09-27 ENCOUNTER — Encounter: Payer: Medicare HMO | Attending: Physical Medicine & Rehabilitation | Admitting: Physical Medicine & Rehabilitation

## 2018-09-27 ENCOUNTER — Encounter: Payer: Self-pay | Admitting: Physical Medicine & Rehabilitation

## 2018-09-27 VITALS — BP 174/116 | HR 98 | Ht 74.0 in | Wt 155.0 lb

## 2018-09-27 DIAGNOSIS — Z8249 Family history of ischemic heart disease and other diseases of the circulatory system: Secondary | ICD-10-CM | POA: Insufficient documentation

## 2018-09-27 DIAGNOSIS — I129 Hypertensive chronic kidney disease with stage 1 through stage 4 chronic kidney disease, or unspecified chronic kidney disease: Secondary | ICD-10-CM | POA: Diagnosis not present

## 2018-09-27 DIAGNOSIS — E1010 Type 1 diabetes mellitus with ketoacidosis without coma: Secondary | ICD-10-CM | POA: Insufficient documentation

## 2018-09-27 DIAGNOSIS — R531 Weakness: Secondary | ICD-10-CM | POA: Insufficient documentation

## 2018-09-27 DIAGNOSIS — Z841 Family history of disorders of kidney and ureter: Secondary | ICD-10-CM | POA: Insufficient documentation

## 2018-09-27 DIAGNOSIS — Z09 Encounter for follow-up examination after completed treatment for conditions other than malignant neoplasm: Secondary | ICD-10-CM | POA: Insufficient documentation

## 2018-09-27 DIAGNOSIS — N181 Chronic kidney disease, stage 1: Secondary | ICD-10-CM | POA: Diagnosis not present

## 2018-09-27 DIAGNOSIS — Z794 Long term (current) use of insulin: Secondary | ICD-10-CM | POA: Diagnosis not present

## 2018-09-27 DIAGNOSIS — F17210 Nicotine dependence, cigarettes, uncomplicated: Secondary | ICD-10-CM | POA: Diagnosis not present

## 2018-09-27 DIAGNOSIS — I63512 Cerebral infarction due to unspecified occlusion or stenosis of left middle cerebral artery: Secondary | ICD-10-CM | POA: Insufficient documentation

## 2018-09-27 DIAGNOSIS — E1051 Type 1 diabetes mellitus with diabetic peripheral angiopathy without gangrene: Secondary | ICD-10-CM

## 2018-09-27 DIAGNOSIS — Z800 Family history of malignant neoplasm of digestive organs: Secondary | ICD-10-CM | POA: Insufficient documentation

## 2018-09-27 DIAGNOSIS — I10 Essential (primary) hypertension: Secondary | ICD-10-CM | POA: Diagnosis not present

## 2018-09-27 DIAGNOSIS — Z833 Family history of diabetes mellitus: Secondary | ICD-10-CM | POA: Insufficient documentation

## 2018-09-27 DIAGNOSIS — Z9114 Patient's other noncompliance with medication regimen: Secondary | ICD-10-CM | POA: Insufficient documentation

## 2018-09-27 NOTE — Progress Notes (Signed)
Subjective:     Patient ID: Nathaniel [name], male    DOB: 09-Apr-1986, 33 y.o.   MRN: [id number]  HPI   Nathaniel [name] is here in follow up of his CVA. He has been walking with a straight cane. Sometimes he walks without on even surfaces. He graduated from therapy last month.   From a standpoint he tells me his sugars are improving. He maintains follow up with endocrine.  He states that his sugars can run from 30-400.Marland Kitchen His sugars are under control when he follows directions, per mom.   He has a similar issue with his BP medications and control.  His blood pressure was elevated today.  He told me he could not find his medications and did not take them today.  He still smokes at least a few cigarettes each day.  Pain Inventory Average Pain 0 Pain Right Now 0 My pain is na  In the last 24 hours, has pain interfered with the following? General activity 0 Relation with others 0 Enjoyment of life 0 What TIME of day is your pain at its worst? na Sleep (in general) Good  Pain is worse with: na Pain improves with: na Relief from Meds: na  Mobility walk with assistance  Function Do you have any goals in this area?  yes  Neuro/Psych trouble walking  Prior Studies Any changes since last visit?  no  Physicians involved in your care Any changes since last visit?  no   Family History  Problem Relation Age of Onset  . Diabetes Father   . Kidney failure Father        last 4 mnths of life  . Hypertension Mother   . Diabetes Maternal Grandmother   . Pancreatic cancer Maternal Grandfather   . Diabetes Paternal Grandfather    Social History   Socioeconomic History  . Marital status: Single    Spouse name: Not on file  . Number of children: Not on file  . Years of education: Not on file  . Highest education level: Not on file  Occupational History  . Occupation: disabled  Social Needs  . Financial resource strain: Not on file  . Food insecurity:    Worry: Not on file   Inability: Not on file  . Transportation needs:    Medical: Not on file    Non-medical: Not on file  Tobacco Use  . Smoking status: Current Every Day Smoker    Packs/day: 0.25    Years: 8.00    Pack years: 2.00    Types: Cigarettes  . Smokeless tobacco: Never Used  . Tobacco comment: smoke 3 to 4 per day  Substance and Sexual Activity  . Alcohol use: No  . Drug use: Yes    Types: Marijuana    Comment: last use yesterday, every other days   . Sexual activity: Yes    Birth control/protection: Condom  Lifestyle  . Physical activity:    Days per week: Not on file    Minutes per session: Not on file  . Stress: Not on file  Relationships  . Social connections:    Talks on phone: Not on file    Gets together: Not on file    Attends religious service: Not on file    Active member of club or organization: Not on file    Attends meetings of clubs or organizations: Not on file    Relationship status: Not on file  Other Topics Concern  . Not on file  Social History Narrative  . Not on file   Past Surgical History:  Procedure Laterality Date  . EYE SURGERY     Past Medical History:  Diagnosis Date  . Diabetes mellitus    lantus/novolog  . Hyperlipidemia   . Hypertension   . Marijuana abuse    occaisionally  . Noncompliance with medication regimen   . Stroke ([place])   . Tobacco abuse    5/day   BP (!) 174/116 (BP Location: Left Arm, Patient Position: Sitting, Cuff Size: Normal)   Pulse 98   Ht 6\' 2"  (1.88 m)   Wt 70.3 kg   SpO2 98%   BMI 19.90 kg/m   Opioid Risk Score:   Fall Risk Score:  `1  Depression screen PHQ 2/9  No flowsheet data found.   Review of Systems  Constitutional: Negative.   HENT: Negative.   Eyes: Negative.   Respiratory: Negative.   Cardiovascular: Negative.   Gastrointestinal: Negative.   Endocrine: Negative.   Genitourinary: Negative.   Musculoskeletal: Positive for gait problem.  Skin: Negative.   Allergic/Immunologic: Negative.     Hematological: Negative.   Psychiatric/Behavioral: Negative.        Objective:   Physical Exam General: No acute distress HEENT: EOMI, oral membranes moist Cards: reg rate  Chest: normal effort Abdomen: Soft, NT, ND Skin: dry, intact Extremities: no edema   Neurological: He is alert and oriented to person, place, and time.  Improving strength right upper and right lower extremities.  Patient uses less steppage gait today but does circumduct the leg slightly.  Heel strike is improved. Strength is 5 out of 5 throughout in all 4 limbs currently.  Sensory is 1+ out of 2 right arm and leg. Cognition and awareness near baseline. Skin: Skin is warm and dry.  Psychiatric: Generally pleasant       Assessment & Plan:  1. Right sided weakness and sensory deficits secondary to bilateral CVA. -continue with HEP    -Work on Aeronautical engineer.  May walk without cane on smooth surfaces 2. T1DM with DKA:             -needs to maintain follow up with endocrine and follow directions    -Try to reiterate the importance of improve glucose control in context of stroke risk 3.Eyes: follow up with optho for ?cataracts.  4. HTN: continue norvasc to 10mg   -CKD l -Reviewed importance of blood pressure control in the context of sport risk.  Also discussed tobacco use in this context.     -Blood pressure was rechecked in the office today.   63minutes of face to face patient care time were spent during this visit. All questions were encouraged and answered. Follow-up with me as needed

## 2018-09-27 NOTE — Patient Instructions (Addendum)
   YOU HAVE TO FIND BETTER WAYS TO CONTROL YOUR BLOOD SUGARS AND BLOOD PRESSURE!!!

## 2018-10-02 NOTE — Progress Notes (Signed)
Patient ID: Nathaniel [name], male   DOB: 01/05/1986, 33 y.o.   MRN: [id number]           Reason for Appointment : Follow-up for Type 1 Diabetes  History of Present Illness   Referring HCP: [facility]         Diagnosis: Type 1 diabetes mellitus, date of diagnosis:  ?  2004        Previous history:  He has had recurrent hospitalizations for ketoacidosis especially in 2017 and 2018, last episode was in 01/2018 Overall has had consistently poor control with A1c as high as 16 In the past he has required large doses of at least basal insulin, taking up to 200 units of Lantus daily at some point  Recent history:     INSULIN regimen is: Novolog 4-8 units mostly twice a day. Lantus 12-20 units in the evening  His A1c is 9% compared to 10.2 previously   Current management, blood sugar patterns and problems identified:    He apparently was taking 60 to 70 units of Levemir and nearly the same in the evening on his last visit in October  He was then switched to [name] 90 units once daily  However he thinks that this made his blood sugars get too low and he stopped it  Most recently has been taking LANTUS which was started at only 10 units dosage in December  Currently he is giving conflicting answers about how much insulin he is taking with the Lantus and he said that he may take 10 units and if he feels his blood sugars going up he will take another 10 units  He was told to take 4 to 8 units of NovoLog depending on his meal size but he is taking this only once a day with his main meal in the evening  GLUCOSE readings at home are being checked mostly midday probably prior to his first meal which is at variable times and except for 3 readings his blood sugars have been markedly increased  Only once has had a low sugar of 52 at around 5 PM  He says that he is eating instant oatmeal for breakfast and for snacks he will have a bag of chips but he does not do any insulin coverage for  these  Also his evening meal may be at variable times and with variable content.  He does not look at the amount of carbohydrates but just the meal size  Despite working with a nutritionist in October last year he has not understood how to plan his meals or cover his meals with appropriate analog insulin   Glucose monitoring:  is being done 3 times a day         Glucometer:  [employer] brand      Blood Glucose readings from meter review  Blood sugars midday range 65-500+ 3-6 PM: 52, 383 AVERAGE 284  Hypoglycemia:  As above Factors causing hyperglycemia: Excessive NovoLog insulin Symptoms of hypoglycemia: Feeling tired, sweaty  Treatment of hypoglycemia: Glucose tablets, usually 2 at a time and use        Self-care: The diet that the patient has been following is: None  Mealtimes are: Very variable, mostly eating 2 meals a day Noon and 8 pm               Dietician consultation: Most recent: 06/2018     CDE consultation: Years ago  Diabetes labs:  Lab Results  Component Value Date   HGBA1C  9.0 (A) 10/03/2018   HGBA1C 10.2 (A) 06/21/2018   HGBA1C 10.0 (H) 02/19/2018   Lab Results  Component Value Date   LDLCALC 201 (H) 02/19/2018   CREATININE 1.64 (H) 07/07/2018    No results found for: MICRALBCREAT   Allergies as of 10/03/2018      Reactions   Fructose Other (See Comments)   Increase of blood sugar      Medication List       Accurate as of October 03, 2018  9:17 PM. Always use your most recent med list.        acetaminophen 325 MG tablet Commonly known as:  TYLENOL Take 1-2 tablets (325-650 mg total) by mouth every 4 (four) hours as needed for mild pain.   amLODipine 10 MG tablet Commonly known as:  NORVASC Take 1 tablet (10 mg total) by mouth daily.   aspirin 325 MG EC tablet TAKE 1 TABLET BY MOUTH EVERY DAY   atorvastatin 80 MG tablet Commonly known as:  LIPITOR Take 1 tablet (80 mg total) by mouth daily at 6 PM.   cloNIDine 0.1 mg/24hr  patch Commonly known as:  CATAPRES - Dosed in mg/24 hr Place 1 patch (0.1 mg total) onto the skin once a week.   clopidogrel 75 MG tablet Commonly known as:  PLAVIX Take 75 mg by mouth daily.   glucagon 1 MG injection Inject 1 mg into the vein once as needed for up to 1 dose.   insulin aspart 100 UNIT/ML FlexPen Commonly known as:  NOVOLOG Inject 8 Units into the skin 3 (three) times daily with meals.   insulin degludec 100 UNIT/ML Sopn FlexTouch Pen Commonly known as:  TRESIBA FLEXTOUCH Inject 0.12 mLs (12 Units total) into the skin daily. Inject 12 units under the skin once daily.   lisinopril 40 MG tablet Commonly known as:  PRINIVIL,ZESTRIL Take 1 tablet (40 mg total) by mouth daily.   Pen Needles 3/16" 31G X 5 MM Misc Four times day       Allergies:  Allergies  Allergen Reactions  . Fructose Other (See Comments)    Increase of blood sugar     Past Medical History:  Diagnosis Date  . Diabetes mellitus    lantus/novolog  . Hyperlipidemia   . Hypertension   . Marijuana abuse    occaisionally  . Noncompliance with medication regimen   . Stroke ([place])   . Tobacco abuse    5/day    Past Surgical History:  Procedure Laterality Date  . EYE SURGERY      Family History  Problem Relation Age of Onset  . Diabetes Father   . Kidney failure Father        last 4 mnths of life  . Hypertension Mother   . Diabetes Maternal Grandmother   . Pancreatic cancer Maternal Grandfather   . Diabetes Paternal Grandfather     Social History:  reports that he has been smoking cigarettes. He has a 2.00 pack-year smoking history. He has never used smokeless tobacco. He reports current drug use. Drug: Marijuana. He reports that he does not drink alcohol.      Review of Systems      Lipids: After his CVA in June he was started on Lipitor 80 mg, last LDL 157 from PCP  Lab Results  Component Value Date   CHOL 260 (H) 02/19/2018   HDL 40 (L) 02/19/2018   LDLCALC 201 (H)  02/19/2018   TRIG 93 02/19/2018   CHOLHDL  6.5 02/19/2018   HYPERTENSION: Has been on treatment for several years, Blood pressure medications including lisinopril, clonidine patch and amlodipine are being prescribed by his PCP  BP Readings from Last 3 Encounters:  10/03/18 130/84  09/27/18 (!) 174/116  08/29/18 (!) 162/98   Kidney disease:  Not clear why he has kidney disease, no recent microalbumin available  Lab Results  Component Value Date   CREATININE 1.64 (H) 07/07/2018   CREATININE 1.99 (H) 03/08/2018   CREATININE 2.06 (H) 03/07/2018    LABS:  Office Visit on 10/03/2018  Component Date Value Ref Range Status  . Hemoglobin A1C 10/03/2018 9.0* 4.0 - 5.6 % Final  . POC Glucose 10/03/2018 594* 70 - 99 mg/dl Final    Physical Examination:  BP 130/84 (BP Location: Left Arm, Patient Position: Sitting, Cuff Size: Normal)   Pulse 63   Ht 6\' 2"  (1.88 m)   Wt 153 lb 9.6 oz (69.7 kg)   SpO2 99%   BMI 19.72 kg/m   Small 1-1.5 cm soft left-sided nodule on the thyroid present mostly on swallowing No lymphadenopathy  ASSESSMENT:  Diabetes type 1, persistently poorly controlled  He is on unusually small doses of basal bolus insulin compared to when he was last seen However A1c is slightly better at 9%  Problems identified:  Minimal glucose monitoring with a generic monitor  He is probably checking blood sugars only before his first meal  He is not getting adequate basal insulin with most of his readings being high and only better probably when he takes double the usual amount of Lantus insulin in the evening empirically  Poor understanding of basal and bolus insulin actions  No understanding about adjusting mealtime dose based on carbohydrate counting and need to cover all carbohydrates with mealtime insulin 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 Glucose monitoring: He will check with his insurance to see what brand of monitor is covered and his mother will look into this . Patient advised to check readings fasting, before meals and also some 2 hours after dinner and some after breakfast . Discussed blood sugar targets for his fasting and after meal blood sugars  2.  Diabetes education: . Patient will need much more diabetes education and will set up to follow-up consultation  3.  Lifestyle changes: . Dietary changes needed: Avoid instant oatmeal . He needs to have more balanced snacks   4.  Medication changes needed: Detailed below Since he has been able to get [name] insulin previously and do this because this will be provided better and more consistent even action over 24 hours Flowsheet with detailed instructions for adjusting the dose given and explained to his mother also As below will need to have them cover all his meals with NovoLog along with adjusting the mealtime coverage with additional correction doses  5.  Preventive care needed:  . He needs to set up an eye exam  6.  Follow-up: 4 weeks   Insulin instructions:  TRESIBA insulin: This insulin provides blood sugar control for up to 24 hours.   Start with 12 units at dinner daily and increase by 2 units every 3 days until the waking up sugars are under 130. Then continue the same dose.   If blood sugar is under 90 for 2  days in a row, reduce the dose by 2 units.  Note that this insulin does not control blood sugar when it goes up quickly  NOVOLOG insulin: Take 4 units for eating oatmeal, 3 units for a bag of chips and 8 units for a full meal.  Take this every time you have a significant amount of carbohydrate  If the blood sugar is over the following numbers at mealtimes add additional NovoLog Over 200 add an additional 2  units Over 250 at 4 units If over 300 at 6 units and if over 400 add 8 units more    Patient Instructions  IMPORTANT: Check your blood sugar before eating each meal Also check on waking up daily  Insulin instructions:  TRESIBA insulin: This insulin provides blood sugar control for up to 24 hours.   Start with 12 units at dinner daily and increase by 2 units every 3 days until the waking up sugars are under 130. Then continue the same dose.   If blood sugar is under 90 for 2 days in a row, reduce the dose by 2 units.  Note that this insulin does not control blood sugar when it goes up quickly  NOVOLOG insulin: Take 4 units for eating oatmeal, 3 units for a bag of chips and 8 units for a full meal.  Take this every time you have a significant amount of carbohydrate  If the blood sugar is over the following numbers at mealtimes add additional NovoLog Over 200 add an additional 2 units Over 250 at 4 units If over 300 at 6 units and if over 400 add 8 units more     Counseling time on subjects discussed in assessment and plan sections is over 50% of today's 60 minute visit   Elayne Snare 10/03/2018, 9:17 PM   Copy of consultation sent to referring physician   Note: This note was prepared with Dragon voice recognition system technology. Any transcriptional errors that result from this process are unintentional.

## 2018-10-03 ENCOUNTER — Ambulatory Visit (INDEPENDENT_AMBULATORY_CARE_PROVIDER_SITE_OTHER): Payer: Medicare HMO | Admitting: Endocrinology

## 2018-10-03 ENCOUNTER — Other Ambulatory Visit: Payer: Self-pay

## 2018-10-03 ENCOUNTER — Encounter: Payer: Self-pay | Admitting: Endocrinology

## 2018-10-03 VITALS — BP 130/84 | HR 63 | Ht 74.0 in | Wt 153.6 lb

## 2018-10-03 DIAGNOSIS — E1051 Type 1 diabetes mellitus with diabetic peripheral angiopathy without gangrene: Secondary | ICD-10-CM

## 2018-10-03 LAB — POCT GLYCOSYLATED HEMOGLOBIN (HGB A1C): HEMOGLOBIN A1C: 9 % — AB (ref 4.0–5.6)

## 2018-10-03 LAB — GLUCOSE, POCT (MANUAL RESULT ENTRY): POC Glucose: 594 mg/dl — AB (ref 70–99)

## 2018-10-03 MED ORDER — INSULIN DEGLUDEC 100 UNIT/ML ~~LOC~~ SOPN: 12.0000 [IU] | PEN_INJECTOR | Freq: Every day | SUBCUTANEOUS | 12 refills | Status: DC

## 2018-10-03 NOTE — Patient Instructions (Signed)
IMPORTANT: Check your blood sugar before eating each meal Also check on waking up daily  Insulin instructions:  TRESIBA insulin: This insulin provides blood sugar control for up to 24 hours.   Start with 12 units at dinner daily and increase by 2 units every 3 days until the waking up sugars are under 130. Then continue the same dose.   If blood sugar is under 90 for 2 days in a row, reduce the dose by 2 units.  Note that this insulin does not control blood sugar when it goes up quickly  NOVOLOG insulin: Take 4 units for eating oatmeal, 3 units for a bag of chips and 8 units for a full meal.  Take this every time you have a significant amount of carbohydrate  If the blood sugar is over the following numbers at mealtimes add additional NovoLog Over 200 add an additional 2 units Over 250 at 4 units If over 300 at 6 units and if over 400 add 8 units more

## 2018-10-16 ENCOUNTER — Ambulatory Visit: Payer: Medicare HMO | Admitting: Adult Health

## 2018-10-31 ENCOUNTER — Other Ambulatory Visit: Payer: Self-pay

## 2018-10-31 ENCOUNTER — Ambulatory Visit: Payer: Medicare HMO | Admitting: *Deleted

## 2018-10-31 ENCOUNTER — Ambulatory Visit (INDEPENDENT_AMBULATORY_CARE_PROVIDER_SITE_OTHER): Payer: Medicare HMO | Admitting: Endocrinology

## 2018-10-31 ENCOUNTER — Encounter: Payer: Self-pay | Admitting: Endocrinology

## 2018-10-31 VITALS — BP 160/102 | HR 98 | Ht 74.0 in | Wt 151.2 lb

## 2018-10-31 DIAGNOSIS — E041 Nontoxic single thyroid nodule: Secondary | ICD-10-CM

## 2018-10-31 DIAGNOSIS — E1065 Type 1 diabetes mellitus with hyperglycemia: Secondary | ICD-10-CM

## 2018-10-31 MED ORDER — GLUCOSE BLOOD VI STRP: 1.0000 | ORAL_STRIP | 3 refills | Status: DC | PRN

## 2018-10-31 NOTE — Patient Instructions (Addendum)
Novolog after eating and taking 3-8 units  Check blood sugars on waking up 7 days a week  Also check blood sugars about 2 hours after meals and do this after different meals by rotation  Recommended blood sugar levels on waking up are 90-130 and about 2 hours after meal is 130-180  Please bring your blood sugar monitor to each visit, thank you  If sugar is over 200 before meals add Novolog:  200-250, add 2 more units,  250-300 add 3 units Over 300 add 5   TRresiba 8 units daily

## 2018-10-31 NOTE — Progress Notes (Signed)
Patient ID: Nathaniel [name], male   DOB: [birth year], 33 y.o.   MRN: [id number]           Reason for Appointment : Follow-up for Type 1 Diabetes  History of Present Illness   Referring HCP: Nathaniel [name]         Diagnosis: Type 1 diabetes mellitus, date of diagnosis:  ?  2004        Previous history:  He has had recurrent hospitalizations for ketoacidosis especially in 2017 and 2018, last episode was in 01/2018 Overall has had consistently poor control with A1c as high as 16 In the past he has required large doses of at least basal insulin, taking up to 200 units of Lantus daily at some point  Recent history:     INSULIN regimen is: Novolog 5 units before dinner.  Tresiba 10 units daily  His A1c in 1/20 is 9% compared to 10.2 previously   Current management, blood sugar patterns and problems identified:    He was told to check and see which meter his insurance will cover but he still has not determined this  He was told to bring his monitor for download and has not done so  Also asked to check blood sugars consistently before and after meals and has apparently done only readings in the mornings  He was told to adjust his Nathaniel [name] which was replacing Lantus based on fasting reading  Apparently his blood sugars were relatively low frequently with 12 units Nathaniel [name] [place] and is now taking 10 units for the last 10 days or so  With this he has not had as many low sugars but still has some readings in the 50s  However blood sugars are very inconsistent fasting with a reading of 541 this morning and he cannot explain this  He also thinks he has no symptoms of low sugar  Difficulty getting history from him  He says that he is having a variable appetite and sometimes made only a few bites  He was also told to adjust his mealtime dose based on his portions but he is usually taking a standard dose of 5 units  He did not follow-up with the diabetes educator as scheduled for today  He thinks  he is taking his Nathaniel [name] and [place] daily   Glucose monitoring:  is being done 1-3 times a day         Glucometer:  [employer] brand      Blood Glucose readings from home record as above  Hypoglycemia:  As above Factors causing hyperglycemia: Excessive NovoLog insulin Symptoms of hypoglycemia: Feeling tired, sweaty  Treatment of hypoglycemia: Glucose tablets, usually 2 at a time and use        Self-care: The diet that the patient has been following is: None  Mealtimes are: Very variable, mostly eating 2 meals a day Noon and 8 pm               Dietician consultation: Most recent: 06/2018     CDE consultation: Years ago  Diabetes labs:  Lab Results  Component Value Date   HGBA1C 9.0 (A) 10/03/2018   HGBA1C 10.2 (A) 06/21/2018   HGBA1C 10.0 (H) 02/19/2018   Lab Results  Component Value Date   LDLCALC 201 (H) 02/19/2018   CREATININE 1.64 (H) 07/07/2018    No results found for: MICRALBCREAT   Allergies as of 10/31/2018      Reactions   Fructose Other (See Comments)   Increase of blood sugar  Medication List       Accurate as of October 31, 2018  9:41 PM. Always use your most recent med list.        acetaminophen 325 MG tablet Commonly known as:  TYLENOL Take 1-2 tablets (325-650 mg total) by mouth every 4 (four) hours as needed for mild pain.   amLODipine 10 MG tablet Commonly known as:  NORVASC Take 1 tablet (10 mg total) by mouth daily.   aspirin 325 MG EC tablet TAKE 1 TABLET BY MOUTH EVERY DAY   atorvastatin 80 MG tablet Commonly known as:  LIPITOR Take 1 tablet (80 mg total) by mouth daily at 6 PM.   clopidogrel 75 MG tablet Commonly known as:  PLAVIX Take 75 mg by mouth daily.   glucagon 1 MG injection Inject 1 mg into the vein once as needed for up to 1 dose.   glucose blood test strip 1 each by Other route as needed for other. Use as instructed to check blood sugar 4 times daily.   insulin aspart 100 UNIT/ML FlexPen Commonly known as:   NOVOLOG Inject 8 Units into the skin 3 (three) times daily with meals.   insulin degludec 100 UNIT/ML Sopn FlexTouch Pen Commonly known as:  TRESIBA FLEXTOUCH Inject 0.12 mLs (12 Units total) into the skin daily. Inject 12 units under the skin once daily.   lisinopril 40 MG tablet Commonly known as:  PRINIVIL,ZESTRIL Take 1 tablet (40 mg total) by mouth daily.   Pen Needles 3/16" 31G X 5 MM Misc Four times day       Allergies:  Allergies  Allergen Reactions  . Fructose Other (See Comments)    Increase of blood sugar     Past Medical History:  Diagnosis Date  . Diabetes mellitus    lantus/novolog  . Hyperlipidemia   . Hypertension   . Marijuana abuse    occaisionally  . Noncompliance with medication regimen   . Stroke (Nathaniel [name])   . Tobacco abuse    5/day    Past Surgical History:  Procedure Laterality Date  . EYE SURGERY      Family History  Problem Relation Age of Onset  . Diabetes Father   . Kidney failure Father        last 4 mnths of life  . Hypertension Mother   . Diabetes Maternal Grandmother   . Pancreatic cancer Maternal Grandfather   . Diabetes Paternal Grandfather     Social History:  reports that he has been smoking cigarettes. He has a 2.00 pack-year smoking history. He has never used smokeless tobacco. He reports current drug use. Drug: Marijuana. He reports that he does not drink alcohol.      Review of Systems      Lipids: After his CVA in June he was started on Lipitor 80 mg, last LDL 157 from PCP  Lab Results  Component Value Date   CHOL 260 (H) 02/19/2018   HDL 40 (L) 02/19/2018   LDLCALC 201 (H) 02/19/2018   TRIG 93 02/19/2018   CHOLHDL 6.5 02/19/2018   HYPERTENSION: Has been on treatment for several years, Blood pressure medications including lisinopril, clonidine patch and amlodipine are being prescribed by his PCP  BP Readings from Last 3 Encounters:  10/31/18 (!) 160/102  10/03/18 130/84  09/27/18 (!) 174/116   Kidney  disease:  Has chronic kidney disease  Lab Results  Component Value Date   CREATININE 1.64 (H) 07/07/2018   CREATININE 1.99 (H) 03/08/2018  CREATININE 2.06 (H) 03/07/2018   He has had a thyroid nodule on previous exam  LABS:  No visits with results within 1 Week(s) from this visit.  Latest known visit with results is:  Office Visit on 10/03/2018  Component Date Value Ref Range Status  . Hemoglobin A1C 10/03/2018 9.0* 4.0 - 5.6 % Final  . POC Glucose 10/03/2018 594* 70 - 99 mg/dl Final    Physical Examination:  BP (!) 160/102 (BP Location: Left Arm, Patient Position: Sitting, Cuff Size: Normal)   Pulse 98   Ht 6\' 2"  (1.88 m)   Wt 151 lb 3.2 oz (68.6 kg)   SpO2 98%   BMI 19.41 kg/m     ASSESSMENT:  Diabetes type 1, persistently poorly controlled  Last A1c was 9%  Problems identified:  He appears to have marked variability in his fasting readings with hypoglycemia periodically and some readings as high as 540 today  He has not checking readings after meals and did not bring his monitor today  Does not have any understanding about how to adjust his mealtime insulin despite previous instructions Not clear if he is checking readings before and after meals Discussed that his mealtime dose cannot be adjusted and less he checks readings after his evening meal which he is not doing  HYPERTENSION: Needs follow-up with PCP   HYPERCHOLESTEROLEMIA: Will need follow-up  Probable left-sided thyroid nodule: Will order thyroid ultrasound, will check TSH on follow-up   PLAN:   1. Glucose monitoring: He will be given a Contour next meter today and testis prescription has been sent 2. Discussed that he needs to check blood sugars 4 times a day to allow establishment of his blood sugar patterns and also he can then qualify for Dexcom or freestyle libre sensor.  Discussed in detail how this would be used and need for more complete information and ease of monitoring   2.   Diabetes education: . Patient will need much more diabetes education and again sent referrals for follow-up consultation  3.  Lifestyle changes: To be determined by diabetes educator  4.  Medication changes needed: Detailed below He is not able to understand how to do adjust insulins and likely is not a good candidate for an insulin pump  5.  Preventive care needed:  . He needs to set up an eye exam  6.  Follow-up: 6 weeks   Insulin instructions: See below  TRESIBA insulin: Will reduce this to 8 units NovoLog will be taken after meals and the range will be 3-8 units +/- correction doses for high or low readings    Patient Instructions  Novolog after eating and taking 3-8 units  Check blood sugars on waking up 7 days a week  Also check blood sugars about 2 hours after meals and do this after different meals by rotation  Recommended blood sugar levels on waking up are 90-130 and about 2 hours after meal is 130-180  Please bring your blood sugar monitor to each visit, thank you  If sugar is over 200 before meals add Novolog:  200-250, add 2 more units,  250-300 add 3 units Over 300 add 5   TRresiba 8 units daily     Counseling time on subjects discussed in assessment and plan sections is over 50% of today's 60 minute visit   Elayne Snare 10/31/2018, 9:41 PM   Copy of consultation sent to referring physician   Note: This note was prepared with Dragon voice recognition system technology. Any  transcriptional errors that result from this process are unintentional.

## 2018-12-09 ENCOUNTER — Other Ambulatory Visit: Payer: Self-pay | Admitting: Endocrinology

## 2018-12-13 ENCOUNTER — Ambulatory Visit: Payer: Medicare HMO | Admitting: Endocrinology

## 2018-12-26 ENCOUNTER — Ambulatory Visit: Payer: Medicare HMO | Admitting: Endocrinology

## 2019-02-05 ENCOUNTER — Ambulatory Visit: Payer: Medicare HMO | Admitting: Endocrinology

## 2019-03-05 ENCOUNTER — Other Ambulatory Visit: Payer: Self-pay

## 2019-03-05 ENCOUNTER — Ambulatory Visit (INDEPENDENT_AMBULATORY_CARE_PROVIDER_SITE_OTHER): Payer: Medicare HMO | Admitting: Endocrinology

## 2019-03-05 ENCOUNTER — Encounter: Payer: Self-pay | Admitting: Endocrinology

## 2019-03-05 VITALS — BP 120/82 | HR 91 | Ht 74.0 in | Wt 143.6 lb

## 2019-03-05 DIAGNOSIS — E1065 Type 1 diabetes mellitus with hyperglycemia: Secondary | ICD-10-CM

## 2019-03-05 DIAGNOSIS — E041 Nontoxic single thyroid nodule: Secondary | ICD-10-CM | POA: Diagnosis not present

## 2019-03-05 DIAGNOSIS — E7800 Pure hypercholesterolemia, unspecified: Secondary | ICD-10-CM | POA: Diagnosis not present

## 2019-03-05 LAB — GLUCOSE, POCT (MANUAL RESULT ENTRY): POC Glucose: 40 mg/dl — AB (ref 70–99)

## 2019-03-05 LAB — POCT GLYCOSYLATED HEMOGLOBIN (HGB A1C): Hemoglobin A1C: 8 % — AB (ref 4.0–5.6)

## 2019-03-05 NOTE — Patient Instructions (Signed)
Novolog 2-4 units to start right after meals, extra 1 units per 100 mg over 100  Then check 2 hrs later   Next day adjust insulin  To keep sugar < 180 after meals  Check blood sugars on waking up days a week  Also check blood sugars about 2 hours after meals and do this after different meals by rotation  Recommended blood sugar levels on waking up are 90-130 and about 2 hours after meal is 130-180  Please bring your blood sugar monitor to each visit, thank you

## 2019-03-05 NOTE — Progress Notes (Signed)
Patient ID: Nathaniel [name], male   DOB: Mar 04, 1986, 33 y.o.   MRN: [id number]           Reason for Appointment : Follow-up for Type 1 Diabetes  History of Present Illness   Referring HCP: Shwartz         Diagnosis: Type 1 diabetes mellitus, date of diagnosis:  ?  2004        Previous history:  He has had recurrent hospitalizations for ketoacidosis especially in 2017 and 2018, last episode was in 01/2018 Overall has had consistently poor control with A1c as high as 16 In the past he has required large doses of at least basal insulin, taking up to 200 units of Lantus daily at some point  Recent history:     INSULIN regimen is: Novolog 5 units before dinner.  Tresiba 6-10 units daily in pm  His A1c is 8%   Current management, blood sugar patterns and problems identified:    He has finally started using a brand-new meter and is using the One Touch ultra 2 m in the last few weeks  Although he has been told to check his sugars several times a day he is mostly doing it only about once a day or less  For some reason and his FASTING blood sugars in the mornings are almost always low with only a couple of readings that are either normal or high  However blood sugars around 5 PM are almost always significantly high  He does not have any set mealtimes and no definite waking or sleeping patterns including sometimes staying up all night  His mother also states that he is not having any significant appetite and is not eating full meals consistently  Difficult to get a history from him again and not clear how often or when he takes the NovoLog 5 units  He thinks he is trying to take his NovoLog before dinner  He probably is adjusting his [name] based on his evening blood sugar other than the morning and will take between 6-10 units based on his blood sugar  HYPOGLYCEMIA: Although he thinks he can feel his blood sugar that low and also will have some slurring of his speech with low  sugars today in the office his blood sugar was 40 had no symptoms  He usually drinks juice to treat the low sugar  Even though he did not take any [name] yesterday reportedly he still has blood sugars in the 40s today  HYPERGLYCEMIA: He has checked his sugar a few times around 5:00 PM and they are mostly high and he does not know why or whether these readings are postprandial   Glucose monitoring:  is being done 1-3 times a day         Glucometer:  One Touch ultra 2      Blood Glucose readings from download and median readings:   PRE-MEAL Fasting  9-10 AM Dinner  4-5 AM Overall  Glucose range:  25-126  35, 37  45-601  28-370   Mean/median:      48   POST-MEAL PC Breakfast  3-4 PM PC Dinner  Glucose range:   43-203   Mean/median:        Hypoglycemia:  As above Symptoms of hypoglycemia: Feeling tired, sweaty, slurred speech, weakness Treatment of hypoglycemia: Juice usually, sometimes glucose tablets, usually 2 at a time     Self-care: The diet that the patient has been following is: None  Mealtimes are: Very  variable              Dietician consultation: Most recent: 06/2018     CDE consultation: Years ago  Diabetes labs:  Lab Results  Component Value Date   HGBA1C 8.0 (A) 03/05/2019   HGBA1C 9.0 (A) 10/03/2018   HGBA1C 10.2 (A) 06/21/2018   Lab Results  Component Value Date   LDLCALC 201 (H) 02/19/2018   CREATININE 1.64 (H) 07/07/2018    No results found for: MICRALBCREAT   Allergies as of 03/05/2019      Reactions   Fructose Other (See Comments)   Increase of blood sugar      Medication List       Accurate as of March 05, 2019  9:19 PM. If you have any questions, ask your nurse or doctor.        acetaminophen 325 MG tablet Commonly known as: TYLENOL Take 1-2 tablets (325-650 mg total) by mouth every 4 (four) hours as needed for mild pain.   amLODipine 10 MG tablet Commonly known as: NORVASC Take 1 tablet (10 mg total) by mouth daily.   aspirin  325 MG EC tablet TAKE 1 TABLET BY MOUTH EVERY DAY   atorvastatin 80 MG tablet Commonly known as: LIPITOR Take 1 tablet (80 mg total) by mouth daily at 6 PM.   clopidogrel 75 MG tablet Commonly known as: PLAVIX Take 75 mg by mouth daily.   glucagon 1 MG injection Inject 1 mg into the vein once as needed for up to 1 dose.   glucose blood test strip 1 each by Other route as needed for other. Use as instructed to check blood sugar 4 times daily.   insulin aspart 100 UNIT/ML FlexPen Commonly known as: NOVOLOG Inject 8 Units into the skin 3 (three) times daily with meals.   insulin degludec 100 UNIT/ML Sopn FlexTouch Pen Commonly known as: [name] FlexTouch Inject 0.12 mLs (12 Units total) into the skin daily. Inject 12 units under the skin once daily. What changed:   how much to take  additional instructions  Another medication with the same name was removed. Continue taking this medication, and follow the directions you see here.   lisinopril 40 MG tablet Commonly known as: ZESTRIL Take 1 tablet (40 mg total) by mouth daily.   Pen Needles 3/16" 31G X 5 MM Misc Four times day       Allergies:  Allergies  Allergen Reactions  . Fructose Other (See Comments)    Increase of blood sugar     Past Medical History:  Diagnosis Date  . Diabetes mellitus    lantus/novolog  . Hyperlipidemia   . Hypertension   . Marijuana abuse    occaisionally  . Noncompliance with medication regimen   . Stroke ([place])   . Tobacco abuse    5/day    Past Surgical History:  Procedure Laterality Date  . EYE SURGERY      Family History  Problem Relation Age of Onset  . Diabetes Father   . Kidney failure Father        last 4 mnths of life  . Hypertension Mother   . Diabetes Maternal Grandmother   . Pancreatic cancer Maternal Grandfather   . Diabetes Paternal Grandfather     Social History:  reports that he has been smoking cigarettes. He has a 2.00 pack-year smoking history. He  has never used smokeless tobacco. He reports current drug use. Drug: Marijuana. He reports that he does not drink alcohol.  Review of Systems      Lipids: After his CVA in June 2019 he was started on Lipitor 80 mg, last LDL 157 from PCP  Lab Results  Component Value Date   CHOL 260 (H) 02/19/2018   HDL 40 (L) 02/19/2018   LDLCALC 201 (H) 02/19/2018   TRIG 93 02/19/2018   CHOLHDL 6.5 02/19/2018   HYPERTENSION: Has been on treatment for several years  Blood pressure medications including lisinopril, clonidine patch and amlodipine are being prescribed by his PCP Has not followed up recently  BP Readings from Last 3 Encounters:  03/05/19 120/82  10/31/18 (!) 160/102  10/03/18 130/84    Has chronic kidney disease and needs follow-up  Lab Results  Component Value Date   CREATININE 1.64 (H) 07/07/2018   CREATININE 1.99 (H) 03/08/2018   CREATININE 2.06 (H) 03/07/2018   He has had a thyroid nodule on previous exam  Lab Results  Component Value Date   TSH 1.810 02/20/2018     Decreased appetite: He has not discussed with PCP.  Currently does not have any nausea or vomiting  LABS:  Office Visit on 03/05/2019  Component Date Value Ref Range Status  . Hemoglobin A1C 03/05/2019 8.0* 4.0 - 5.6 % Final  . POC Glucose 03/05/2019 40* 70 - 99 mg/dl Final    Physical Examination:  BP 120/82 (BP Location: Left Arm, Patient Position: Sitting, Cuff Size: Normal)   Pulse 91   Ht 6\' 2"  (1.88 m)   Wt 143 lb 9.6 oz (65.1 kg)   SpO2 93%   BMI 18.44 kg/m     ASSESSMENT:  Diabetes type 1, persistently poorly controlled  A1c is 8% although previously higher Most likely is lower A1c is related to more frequent hypoglycemia  Problems identified:  He appears to have extreme sensitivity to insulin and getting hypoglycemia even with small doses of Tresiba usually 10 units or less  However he is still not adjusting his [name] based on fasting readings are more related to  his evening reading  He has totally inadequate frequency of glucose monitoring for his needs  Also likely not taking NovoLog based on what he is eating but just a flat dose of 5 units  Most of the readings in the evenings are high and may be related to carbohydrate intake not covered with NovoLog  Marked variability in his fasting readings with hypoglycemia periodically and some readings as high as 540 today  He has not checking readings after meals and did not bring his monitor today  Does not have any understanding about how to adjust his mealtime insulin despite previous instructions  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  Also needs possible GI consultation.  Although he has no symptoms of nausea he may well have gastroparesis  HYPERCHOLESTEROLEMIA: Will need follow-up    PLAN:   1. Glucose monitoring: He was advised to check sugars 4 times a day regardless of whether he is eating or not He may be able to get the freestyle libre for testing enough  2.  Diabetes education: Patient will need to continue periodic diabetes education  3.  Insulin regimen   Since he has markedly decreased appetite he needs to assess his actual intake of carbohydrates and portions before dosing his NovoLog.  Most likely he just needs 2 to 4 units to start  with and then assess how his blood sugar responds 2 hours after eating.  Detailed instructions given also  Basal insulin: He will start adjusting this using the flowsheet provided with detailed instructions based on his fasting readings with a target of 90-130  Since he is very insulin sensitive we can start with 5 units for now and keep the same dose for 3 days a O  He will start basal insulin tomorrow evening if his fasting readings are at least 90  Needs more regular follow-up    Patient  Instructions  Novolog 2-4 units to start right after meals, extra 1 units per 100 mg over 100  Then check 2 hrs later   Next day adjust insulin  To keep sugar < 180 after meals  Check blood sugars on waking up days a week  Also check blood sugars about 2 hours after meals and do this after different meals by rotation  Recommended blood sugar levels on waking up are 90-130 and about 2 hours after meal is 130-180  Please bring your blood sugar monitor to each visit, thank you       Counseling time on subjects discussed in assessment and plan sections is over 50% of today's 25 minute visit   Elayne Snare 03/05/2019, 9:19 PM      Note: This note was prepared with Dragon voice recognition system technology. Any transcriptional errors that result from this process are unintentional.

## 2019-03-06 ENCOUNTER — Telehealth: Payer: Self-pay

## 2019-03-06 ENCOUNTER — Other Ambulatory Visit: Payer: Self-pay | Admitting: Endocrinology

## 2019-03-06 ENCOUNTER — Other Ambulatory Visit: Payer: Self-pay

## 2019-03-06 ENCOUNTER — Other Ambulatory Visit (INDEPENDENT_AMBULATORY_CARE_PROVIDER_SITE_OTHER): Payer: Medicare HMO

## 2019-03-06 DIAGNOSIS — N289 Disorder of kidney and ureter, unspecified: Secondary | ICD-10-CM

## 2019-03-06 DIAGNOSIS — E041 Nontoxic single thyroid nodule: Secondary | ICD-10-CM | POA: Diagnosis not present

## 2019-03-06 DIAGNOSIS — E1021 Type 1 diabetes mellitus with diabetic nephropathy: Secondary | ICD-10-CM

## 2019-03-06 LAB — LIPID PANEL
Cholesterol: 142 mg/dL (ref 0–200)
HDL: 36.9 mg/dL — ABNORMAL LOW (ref >39.00)
LDL Cholesterol: 86 mg/dL (ref 0–99)
NonHDL: 105.07
Total CHOL/HDL Ratio: 4
Triglycerides: 97 mg/dL (ref 0.0–149.0)
VLDL: 19.4 mg/dL (ref 0.0–40.0)

## 2019-03-06 LAB — COMPREHENSIVE METABOLIC PANEL
ALT: 12 U/L (ref 0–53)
AST: 21 U/L (ref 0–37)
Albumin: 4.2 g/dL (ref 3.5–5.2)
Alkaline Phosphatase: 84 U/L (ref 39–117)
BUN: 38 mg/dL — ABNORMAL HIGH (ref 6–23)
CO2: 25 mEq/L (ref 19–32)
Calcium: 9.2 mg/dL (ref 8.4–10.5)
Chloride: 107 mEq/L (ref 96–112)
Creatinine, Ser: 2.82 mg/dL — ABNORMAL HIGH (ref 0.40–1.50)
GFR: 31.57 mL/min — ABNORMAL LOW (ref >60.00)
Glucose, Bld: 28 mg/dL — CL (ref 70–99)
Potassium: 4.4 mEq/L (ref 3.5–5.1)
Sodium: 140 mEq/L (ref 135–145)
Total Bilirubin: 0.3 mg/dL (ref 0.2–1.2)
Total Protein: 7.5 g/dL (ref 6.0–8.3)

## 2019-03-06 LAB — TSH: TSH: 0.97 u[IU]/mL (ref 0.35–4.50)

## 2019-03-06 NOTE — Telephone Encounter (Signed)
Main lab just called with a critical glucose of 28.

## 2019-03-06 NOTE — Telephone Encounter (Signed)
Called pt and spoke to pt's mother. Pt's mother stated that the pt was okay at this time and blood sugar is stable.

## 2019-03-16 ENCOUNTER — Other Ambulatory Visit: Payer: Self-pay | Admitting: Endocrinology

## 2019-04-16 ENCOUNTER — Other Ambulatory Visit: Payer: Self-pay

## 2019-04-16 ENCOUNTER — Ambulatory Visit: Payer: Medicare HMO | Admitting: Neurology

## 2019-04-16 ENCOUNTER — Encounter: Payer: Self-pay | Admitting: Endocrinology

## 2019-04-16 ENCOUNTER — Ambulatory Visit (INDEPENDENT_AMBULATORY_CARE_PROVIDER_SITE_OTHER): Payer: Medicare HMO | Admitting: Endocrinology

## 2019-04-16 VITALS — BP 100/62 | HR 102 | Ht 74.0 in | Wt 141.0 lb

## 2019-04-16 DIAGNOSIS — E1065 Type 1 diabetes mellitus with hyperglycemia: Secondary | ICD-10-CM | POA: Diagnosis not present

## 2019-04-16 DIAGNOSIS — E10649 Type 1 diabetes mellitus with hypoglycemia without coma: Secondary | ICD-10-CM | POA: Diagnosis not present

## 2019-04-16 NOTE — Patient Instructions (Addendum)
3 Tresiba daily and check sugar before taking  Check sugars 4x daily  Take 1/2 Lisinopril  Take 1 unit Novolog per 15 g carbs at meals (mostly  2-4 Novolog

## 2019-04-16 NOTE — Progress Notes (Signed)
Patient ID: Nathaniel [name], male   DOB: 07-02-1986, 33 y.o.   MRN: [id number]           Reason for Appointment : Follow-up for Type 1 Diabetes  History of Present Illness   Referring HCP: Shwartz         Diagnosis: Type 1 diabetes mellitus, date of diagnosis:  ?  2004        Previous history:  He has had recurrent hospitalizations for ketoacidosis especially in 2017 and 2018, last episode was in 01/2018 Overall has had consistently poor control with A1c as high as 16 In the past he has required large doses of at least basal insulin, taking up to 200 units of Lantus daily at some point  Recent history:     INSULIN regimen is: Novolog 5-10 units before dinner.  Tresiba 5 units daily in pm  His A1c is 8% as of 6/20   Current management, blood sugar patterns and problems identified:    He again is is using the One Touch ultra 2 m in the last few weeks  Although he was given very specific instructions on his [name] dosage and asked to keep a flow chart to adjust the dose based on fasting readings he has not done any significant monitoring  He says he is taking the same dose of 5 units Tresiba every evening regardless of what his blood sugars are  He usually feels lethargic when he has low blood sugars and except for a reading of 227 today without food pre-noon all his blood sugars in the morning hours are below 50  He says he takes 5 to 10 units of NovoLog but not clear how he is adjusting this, not based on what he is eating  Even with eating just oatmeal last evening for dinner he did not take any NovoLog  For this reason sometimes he may have readings as high as 566 but he cannot explain why it was higher  Despite instructions are checking blood sugars several times a day in the last 2 weeks he has done only 12 blood sugars and several days without any readings   Glucose monitoring:  is being done 1-3 times a day         Glucometer:  One Touch ultra 2      Blood Glucose  readings from download and median readings:   PRE-MEAL Fasting  4-6 PM  11 PM  3 AM Overall  Glucose range:  33-47, 227  47-566  365  33, 73 33-566  Mean/median:     51   Previous readings:  PRE-MEAL Fasting  9-10 AM Dinner  4-5 AM Overall  Glucose range:  25-126  35, 37  45-601  28-370   Mean/median:      48   POST-MEAL PC Breakfast  3-4 PM PC Dinner  Glucose range:   43-203   Mean/median:        Hypoglycemia:  As above Symptoms of hypoglycemia: Feeling tired, sweaty, slurred speech, weakness Treatment of hypoglycemia: Juice usually, sometimes glucose tablets, usually 2 at a time     Self-care: The diet that the patient has been following is: None  Mealtimes are: Very variable              Dietician consultation: Most recent: 06/2018     CDE consultation: Years ago  Diabetes labs:  Lab Results  Component Value Date   HGBA1C 8.0 (A) 03/05/2019   HGBA1C 9.0 (A) 10/03/2018  HGBA1C 10.2 (A) 06/21/2018   Lab Results  Component Value Date   LDLCALC 86 03/05/2019   CREATININE 2.82 (H) 03/05/2019    No results found for: MICRALBCREAT  Wt Readings from Last 3 Encounters:  04/16/19 141 lb (64 kg)  03/05/19 143 lb 9.6 oz (65.1 kg)  10/31/18 151 lb 3.2 oz (68.6 kg)     Allergies as of 04/16/2019      Reactions   Fructose Other (See Comments)   Increase of blood sugar      Medication List       Accurate as of April 16, 2019  9:17 PM. If you have any questions, ask your nurse or doctor.        acetaminophen 325 MG tablet Commonly known as: TYLENOL Take 1-2 tablets (325-650 mg total) by mouth every 4 (four) hours as needed for mild pain.   amLODipine 10 MG tablet Commonly known as: NORVASC Take 1 tablet (10 mg total) by mouth daily.   aspirin 325 MG EC tablet TAKE 1 TABLET BY MOUTH EVERY DAY   atorvastatin 80 MG tablet Commonly known as: LIPITOR Take 1 tablet (80 mg total) by mouth daily at 6 PM.   clopidogrel 75 MG tablet Commonly known as:  PLAVIX Take 75 mg by mouth daily.   glucagon 1 MG injection Inject 1 mg into the vein once as needed for up to 1 dose.   insulin aspart 100 UNIT/ML FlexPen Commonly known as: NOVOLOG Inject 8 Units into the skin 3 (three) times daily with meals.   insulin degludec 100 UNIT/ML Sopn FlexTouch Pen Commonly known as: [name] FlexTouch Inject 0.12 mLs (12 Units total) into the skin daily. Inject 12 units under the skin once daily. What changed:   how much to take  additional instructions   lisinopril 40 MG tablet Commonly known as: ZESTRIL Take 1 tablet (40 mg total) by mouth daily.   OneTouch Ultra test strip Generic drug: glucose blood USE TO TEST BLOOD SUGAR 4 TIMES A DAY (INSURANCE WILL ONLY PAY FOR HIM TO TEST 3 TIMES A DAY)   Pen Needles 3/16" 31G X 5 MM Misc Four times day       Allergies:  Allergies  Allergen Reactions  . Fructose Other (See Comments)    Increase of blood sugar     Past Medical History:  Diagnosis Date  . Diabetes mellitus    lantus/novolog  . Hyperlipidemia   . Hypertension   . Marijuana abuse    occaisionally  . Noncompliance with medication regimen   . Stroke ([place])   . Tobacco abuse    5/day    Past Surgical History:  Procedure Laterality Date  . EYE SURGERY      Family History  Problem Relation Age of Onset  . Diabetes Father   . Kidney failure Father        last 4 mnths of life  . Hypertension Mother   . Diabetes Maternal Grandmother   . Pancreatic cancer Maternal Grandfather   . Diabetes Paternal Grandfather     Social History:  reports that he has been smoking cigarettes. He has a 2.00 pack-year smoking history. He has never used smokeless tobacco. He reports current drug use. Drug: Marijuana. He reports that he does not drink alcohol.      Review of Systems      Lipids: After his CVA in June 2019 he was started on Lipitor 80 mg, last LDL as follows    Lab Results  Component Value Date   CHOL 142 03/05/2019    HDL 36.90 (L) 03/05/2019   LDLCALC 86 03/05/2019   TRIG 97.0 03/05/2019   CHOLHDL 4 03/05/2019   HYPERTENSION: Has been on treatment for several years  Blood pressure medications including lisinopril 40 mg, clonidine patch and amlodipine are being prescribed by his PCP Blood pressure appears lower than usual today  BP Readings from Last 3 Encounters:  04/16/19 100/62  03/05/19 120/82  10/31/18 (!) 160/102    Has chronic kidney disease with recent worsening of creatinine and he is due to establish with his nephrologist very soon  Lab Results  Component Value Date   CREATININE 2.82 (H) 03/05/2019   CREATININE 1.64 (H) 07/07/2018   CREATININE 1.99 (H) 03/08/2018   He has had a thyroid nodule on previous exam  Lab Results  Component Value Date   TSH 0.97 03/06/2019     Decreased appetite: He has been eating a little better but still has not discussed with PCP.  No complaints of nausea and vomiting  LABS:  No visits with results within 1 Week(s) from this visit.  Latest known visit with results is:  Lab on 03/06/2019  Component Date Value Ref Range Status  . TSH 03/06/2019 0.97  0.35 - 4.50 uIU/mL Final    Physical Examination:  BP 100/62 (BP Location: Left Arm, Patient Position: Sitting, Cuff Size: Normal)   Pulse (!) 102   Ht 6\' 2"  (1.88 m)   Wt 141 lb (64 kg)   SpO2 99%   BMI 18.10 kg/m     ASSESSMENT:  Diabetes type 1, persistently poorly controlled  A1c is 8% previously but most of his blood sugars appear to be low at home   Problems identified:  He again is requiring minimal amounts of basal insulin because of having low blood sugars in the morning hours  However blood sugar monitoring is infrequent and difficult to establish a pattern  At the same time he will have a random reading of 566 also  Does not always have symptoms of hypoglycemia  Previously had also discussed using a CGM to help care with more consistent monitoring pattern but he  is still not checking blood sugars enough to qualify  Also taking NovoLog randomly and his doses are not based on any blood sugar readings or carbohydrate counting   HYPERTENSION: Needs to reduce lisinopril to half a tablet for now because of low normal reading  CKD: Awaiting nephrology consultation  HYPERCHOLESTEROLEMIA: Continue Lipitor    PLAN:   1. Glucose monitoring: He was advised to check sugars 4 times a day consistently and again discussed availability of CGM if he can do this He needs to check blood sugar consistently before taking Tresiba  2.  Diabetes education: Patient will need to be scheduled for follow-up with diabetes educator  3.  Insulin regimen   For now just take 3 units of Tresiba instead of 5  If his blood sugar is low at the time of injection he can skip the dose  Start counting carbohydrates and use 1 unit for 15 g of carbohydrate for his NovoLog dose and try not to exceed 4 units  However may take extra insulin when blood sugars are high as discussed previously  Discussed blood sugar targets at various times and need to avoid hypoglycemia  Balanced meals with some protein at each meal  Avoid drinks with sugar    Patient Instructions  3 Tresiba daily and check sugar before  taking  Check sugars 4x daily  Take 1/2 Lisinopril  Take 1 unit Novolog per 15 g carbs at meals (mostly  2-4 Novolog     Counseling time on subjects discussed in assessment and plan sections is over 50% of today's 25 minute visit   Elayne Snare 04/16/2019, 9:17 PM      Note: This note was prepared with Dragon voice recognition system technology. Any transcriptional errors that result from this process are unintentional.

## 2019-04-20 ENCOUNTER — Other Ambulatory Visit: Payer: Self-pay

## 2019-04-20 ENCOUNTER — Other Ambulatory Visit: Payer: Self-pay | Admitting: Endocrinology

## 2019-04-20 MED ORDER — TRESIBA FLEXTOUCH 100 UNIT/ML ~~LOC~~ SOPN: PEN_INJECTOR | SUBCUTANEOUS | 12 refills | Status: DC

## 2019-04-25 ENCOUNTER — Other Ambulatory Visit: Payer: Self-pay | Admitting: Nephrology

## 2019-04-25 ENCOUNTER — Other Ambulatory Visit (HOSPITAL_COMMUNITY): Payer: Self-pay | Admitting: Nephrology

## 2019-04-25 ENCOUNTER — Encounter: Payer: Medicare HMO | Admitting: Nutrition

## 2019-04-25 DIAGNOSIS — N179 Acute kidney failure, unspecified: Secondary | ICD-10-CM

## 2019-04-25 DIAGNOSIS — N184 Chronic kidney disease, stage 4 (severe): Secondary | ICD-10-CM

## 2019-05-01 ENCOUNTER — Ambulatory Visit (HOSPITAL_COMMUNITY)
Admission: RE | Admit: 2019-05-01 | Discharge: 2019-05-01 | Disposition: A | Discharge status: Home / Self Care | Payer: Medicare HMO | Source: Ambulatory Visit | Attending: Nephrology | Admitting: Nephrology

## 2019-05-01 ENCOUNTER — Other Ambulatory Visit: Payer: Self-pay

## 2019-05-01 DIAGNOSIS — N179 Acute kidney failure, unspecified: Secondary | ICD-10-CM

## 2019-05-01 DIAGNOSIS — N184 Chronic kidney disease, stage 4 (severe): Secondary | ICD-10-CM | POA: Insufficient documentation

## 2019-06-08 ENCOUNTER — Other Ambulatory Visit: Payer: Self-pay

## 2019-06-11 ENCOUNTER — Encounter: Payer: Self-pay | Admitting: Endocrinology

## 2019-06-11 ENCOUNTER — Other Ambulatory Visit: Payer: Self-pay

## 2019-06-11 ENCOUNTER — Ambulatory Visit (INDEPENDENT_AMBULATORY_CARE_PROVIDER_SITE_OTHER): Payer: Medicare HMO | Admitting: Endocrinology

## 2019-06-11 VITALS — BP 160/80 | HR 103 | Ht 74.0 in | Wt 154.2 lb

## 2019-06-11 DIAGNOSIS — Z23 Encounter for immunization: Secondary | ICD-10-CM | POA: Diagnosis not present

## 2019-06-11 DIAGNOSIS — E1065 Type 1 diabetes mellitus with hyperglycemia: Secondary | ICD-10-CM

## 2019-06-11 LAB — POCT GLYCOSYLATED HEMOGLOBIN (HGB A1C): Hemoglobin A1C: 9.3 % — AB (ref 4.0–5.6)

## 2019-06-11 NOTE — Patient Instructions (Signed)
Check blood sugars on waking up 7 days a week and before each meal   Also check blood sugars about 2 hours after meals and do this after different meals by rotation  Recommended blood sugar levels on waking up are 90-180 and about 2 hours after meal is 130-200  Please bring your blood sugar monitor to each visit, thank you  No sugar drinks

## 2019-06-11 NOTE — Progress Notes (Signed)
Patient ID: Nathaniel [name], male   DOB: 05/20/1986, 33 y.o.   MRN: [id number]           Reason for Appointment : Follow-up for Type 1 Diabetes  History of Present Illness   Referring HCP: Shwartz         Diagnosis: Type 1 diabetes mellitus, date of diagnosis:  ?  2004        Previous history:  He has had recurrent hospitalizations for ketoacidosis especially in 2017 and 2018, last episode was in 01/2018 Overall has had consistently poor control with A1c as high as 16 In the past he has required large doses of at least basal insulin, taking up to 200 units of Lantus daily at some point  Recent history:     INSULIN regimen is: Novolog 5-10 units before dinner.  Tresiba 4-5 units daily in pm  His A1c is higher at 9.3  Current management, blood sugar patterns and problems identified:    He has checked his blood sugar very infrequently in the last month  He has had a few readings around 2 PM and a few readings around 6-7 PM only and not clear which readings are before or after meals  Although previously was having more frequent hypoglycemia he has documented only 4 readings that are low in the last 30 days  Most of his blood sugars are around 6-7 PM and he thinks that these are after taking NovoLog  Again unclear how much NovoLog he is taking he probably takes about 4 to 5 units  He does not base his NovoLog dose on his pre-meal blood sugar but approximately on his meal size  However he also has readings up to 494 in the evening and he does not  His mother says that he is frequently drinking sweet tea or Sunny delight which still has some sugar and he will drink a quart bottle every night  TRESIBA: He was told to start with 3 to 4 units and titrate based on his morning readings but he is taking this according to how he feels and some days may not take any and other days may take 10 units  He also thinks that [name] brings his blood sugar down fast  His mealtimes are  extremely variable and usually does not eat his first meal until afternoon   Glucose monitoring:  is being done 1-3 times a day         Glucometer:  One Touch ultra 2      Blood Glucose readings from download and median readings:   2 PM readings range from 38 up to 317 6-7 PM readings range from 32 up to 494 Overall average 240  PREVIOUS readings  PRE-MEAL Fasting  4-6 PM  11 PM  3 AM Overall  Glucose range:  33-47, 227  47-566  365  33, 73 33-566  Mean/median:     51      Hypoglycemia:  As above Symptoms of hypoglycemia: Feeling tired, sweaty, slurred speech, weakness Treatment of hypoglycemia: Juice usually, sometimes glucose tablets, usually 2 at a time     Self-care: The diet that the patient has been following is: None  Mealtimes are: Very variable              Dietician consultation: Most recent: 06/2018     CDE consultation: Years ago  Diabetes labs:  Lab Results  Component Value Date   HGBA1C 9.3 (A) 06/11/2019   HGBA1C 8.0 (A) 03/05/2019  HGBA1C 9.0 (A) 10/03/2018   Lab Results  Component Value Date   LDLCALC 86 03/05/2019   CREATININE 2.82 (H) 03/05/2019    No results found for: MICRALBCREAT  Wt Readings from Last 3 Encounters:  06/11/19 154 lb 3.2 oz (69.9 kg)  04/16/19 141 lb (64 kg)  03/05/19 143 lb 9.6 oz (65.1 kg)     Allergies as of 06/11/2019      Reactions   Fructose Other (See Comments)   Increase of blood sugar      Medication List       Accurate as of June 11, 2019  8:54 PM. If you have any questions, ask your nurse or doctor.        acetaminophen 325 MG tablet Commonly known as: TYLENOL Take 1-2 tablets (325-650 mg total) by mouth every 4 (four) hours as needed for mild pain.   amLODipine 10 MG tablet Commonly known as: NORVASC Take 1 tablet (10 mg total) by mouth daily.   aspirin 325 MG EC tablet TAKE 1 TABLET BY MOUTH EVERY DAY   atorvastatin 80 MG tablet Commonly known as: LIPITOR Take 1 tablet (80 mg total)  by mouth daily at 6 PM.   clopidogrel 75 MG tablet Commonly known as: PLAVIX Take 75 mg by mouth daily.   glucagon 1 MG injection Inject 1 mg into the vein once as needed for up to 1 dose.   insulin aspart 100 UNIT/ML FlexPen Commonly known as: NOVOLOG Inject 8 Units into the skin 3 (three) times daily with meals.   lisinopril 40 MG tablet Commonly known as: ZESTRIL Take 1 tablet (40 mg total) by mouth daily.   OneTouch Ultra test strip Generic drug: glucose blood USE TO TEST BLOOD SUGAR 4 TIMES A DAY (INSURANCE WILL ONLY PAY FOR HIM TO TEST 3 TIMES A DAY)   Pen Needles 3/16" 31G X 5 MM Misc Four times day   Tresiba FlexTouch 100 UNIT/ML Sopn FlexTouch Pen Generic drug: insulin degludec Inject 3 units under the skin once daily. Hold for low blood sugars. What changed:   how much to take  additional instructions       Allergies:  Allergies  Allergen Reactions  . Fructose Other (See Comments)    Increase of blood sugar     Past Medical History:  Diagnosis Date  . Diabetes mellitus    lantus/novolog  . Hyperlipidemia   . Hypertension   . Marijuana abuse    occaisionally  . Noncompliance with medication regimen   . Stroke ([place])   . Tobacco abuse    5/day    Past Surgical History:  Procedure Laterality Date  . EYE SURGERY      Family History  Problem Relation Age of Onset  . Diabetes Father   . Kidney failure Father        last 4 mnths of life  . Hypertension Mother   . Diabetes Maternal Grandmother   . Pancreatic cancer Maternal Grandfather   . Diabetes Paternal Grandfather     Social History:  reports that he has been smoking cigarettes. He has a 2.00 pack-year smoking history. He has never used smokeless tobacco. He reports current drug use. Drug: Marijuana. He reports that he does not drink alcohol.      Review of Systems      Lipids: After his CVA in June 2019 he was started on Lipitor 80 mg, last LDL as follows    Lab Results   Component Value Date  CHOL 142 03/05/2019   HDL 36.90 (L) 03/05/2019   LDLCALC 86 03/05/2019   TRIG 97.0 03/05/2019   CHOLHDL 4 03/05/2019   HYPERTENSION: Has been on treatment for several years  Blood pressure medications including lisinopril 40 mg, and amlodipine are being prescribed by his PCP Blood pressure also followed by nephrologist now  BP Readings from Last 3 Encounters:  06/11/19 (!) 160/80  04/16/19 100/62  03/05/19 120/82    Has chronic kidney disease, recently seen by nephrologist and further work-up done but written results not available, reportedly improved  Lab Results  Component Value Date   CREATININE 2.82 (H) 03/05/2019   CREATININE 1.64 (H) 07/07/2018   CREATININE 1.99 (H) 03/08/2018   He has had a thyroid nodule on previous exam  Lab Results  Component Value Date   TSH 0.97 03/06/2019       LABS:  Office Visit on 06/11/2019  Component Date Value Ref Range Status  . Hemoglobin A1C 06/11/2019 9.3* 4.0 - 5.6 % Final    Physical Examination:  BP (!) 160/80 (BP Location: Left Arm, Patient Position: Sitting, Cuff Size: Normal)   Pulse (!) 103   Ht 6\' 2"  (1.88 m)   Wt 154 lb 3.2 oz (69.9 kg)   SpO2 98%   BMI 19.80 kg/m     ASSESSMENT:  Diabetes type 1, persistently poorly controlled  A1c is now 9.3%  A1c does not appear to correlate with his blood sugars at home   Problems identified:  He has difficulty understanding timing, adjustment and actions of both basal and bolus insulins  The greatest difficulty in managing his diabetes related to very inadequate home glucose monitoring  He is empirically adjusting his [name] based on how he is subjectively feeling instead of based on fasting blood sugar  Also not clear how well he is adjusting his NovoLog based on carbohydrates and meal size  His diet has been inconsistent and continues to have some drinks with simple sugars also  He also has labile blood sugars  With variable  renal function his insulin requirements appear to be changing and hypoglycemia appears to be less frequent  HYPERTENSION:  CKD: Awaiting report of last nephrology consultation      PLAN:   1. Glucose monitoring: He was again advised to check sugars 4 times a day consistently.  If he is able to document this he can bill is able to use a CGM  2.  Diabetes education: Patient will need to be scheduled for follow-up with diabetes educator, he does not notice as directed.  We will also try to see if he can be studied with a professional CGM to enable assessment of his blood sugar patterns  3.  Insulin regimen   Given him a titration sheet for adjusting Tresiba insulin based on fasting blood sugars every 3 days He will start with 5 units daily and adjust by 1 unit until morning sugars are consistently below 180 but also if getting below 90 will reduce the dose by 1 unit  MEALTIME insulin: He will take the NovoLog before each meal and likely needs 4 to 5 units for any meal that has carbohydrate but also check sugars 2 hours after eating  4.  Other  Stop drinking all drinks with sugar  Keep a diary of what he is eating along with blood sugars before and 2 hours after each of his meals on the flow sheets provided.  This will be evaluated by the nurse educator on  upcoming visit  Also if he is able to start the professional CGM he can keep food diary with this  Although he may be a candidate for closed-loop insulin pump he does need better compliance with glucose monitoring, mealtime bolus consistency and ability to bolus appropriately for all meals    Patient Instructions  Check blood sugars on waking up 7 days a week and before each meal   Also check blood sugars about 2 hours after meals and do this after different meals by rotation  Recommended blood sugar levels on waking up are 90-180 and about 2 hours after meal is 130-200  Please bring your blood sugar monitor to each  visit, thank you  No sugar drinks   Counseling time on subjects discussed in assessment and plan sections is over 50% of today's 25 minute visit   Elayne Snare 06/11/2019, 8:54 PM      Note: This note was prepared with Dragon voice recognition system technology. Any transcriptional errors that result from this process are unintentional.

## 2019-06-26 ENCOUNTER — Encounter: Payer: Medicare HMO | Admitting: Nutrition

## 2019-07-19 ENCOUNTER — Other Ambulatory Visit: Payer: Self-pay

## 2019-07-23 ENCOUNTER — Other Ambulatory Visit: Payer: Self-pay

## 2019-07-23 ENCOUNTER — Encounter: Payer: Self-pay | Admitting: Endocrinology

## 2019-07-23 ENCOUNTER — Ambulatory Visit (INDEPENDENT_AMBULATORY_CARE_PROVIDER_SITE_OTHER): Payer: Medicare HMO | Admitting: Endocrinology

## 2019-07-23 VITALS — BP 160/100 | HR 94 | Ht 74.0 in | Wt 149.2 lb

## 2019-07-23 DIAGNOSIS — E1065 Type 1 diabetes mellitus with hyperglycemia: Secondary | ICD-10-CM | POA: Diagnosis not present

## 2019-07-23 DIAGNOSIS — E1021 Type 1 diabetes mellitus with diabetic nephropathy: Secondary | ICD-10-CM | POA: Diagnosis not present

## 2019-07-23 MED ORDER — TRESIBA FLEXTOUCH 100 UNIT/ML ~~LOC~~ SOPN: 10.0000 [IU] | PEN_INJECTOR | Freq: Every day | SUBCUTANEOUS | 0 refills | Status: DC

## 2019-07-23 NOTE — Progress Notes (Signed)
Patient ID: Nathaniel [name], male   DOB: 1985/10/15, 33 y.o.   MRN: [id number]           Reason for Appointment : Follow-up for Type 1 Diabetes  History of Present Illness   Referring HCP: Shwartz         Diagnosis: Type 1 diabetes mellitus, date of diagnosis:  ?  2004        Previous history:  He has had recurrent hospitalizations for ketoacidosis especially in 2017 and 2018, last episode was in 01/2018 Overall has had consistently poor control with A1c as high as 16 In the past he has required large doses of at least basal insulin, taking up to 200 units of Lantus daily at some point  Recent history:     INSULIN regimen is: Novolog 5-10 units before dinner.  Tresiba 4-5 units daily in pm  His A1c is higher at 9.3  Current management, blood sugar patterns and problems identified:    Although he was checking his blood sugar somewhat more after his last visit the last 2 weeks has only 8 readings  As before he is checking blood sugars primarily FASTING which is at variable times in the afternoon  He says that he was taking [name] only 5 units and did not adjust it based on what his fasting blood sugars were as recommended; may have taken 7 or 8 units a couple of times  With his blood sugars in the last week or so mostly running high he has taken 10 units Tresiba 1 or 2 times  However he skipped this completely yesterday because he forgot to take it around 6 PM and was not home till late night  Over the last 30 days he has had 5 instances of blood sugars that are significantly hypoglycemic  However also appears to have about 5 readings that were fairly close to normal also  He says that he does not take NovoLog much at all and may have taken it only occasionally  With taking correction dose on one evening for blood sugar of 565 his blood sugar became low  Does not still understand how to adjust the NovoLog based on what he is eating or the blood sugar readings  No blood  sugars are available postprandially  He is not very active physically  His mealtimes are extremely variable and usually does not eat his first meal until afternoon  Diet: He is frequently eating pancakes and syrup for breakfast, otherwise usually has hamburgers and hotdogs or noodles for meals but does not cover these with NovoLog as carbohydrates   Glucose monitoring:  is being done 1-3 times a day         Glucometer:  One Touch ultra 2      Blood Glucose readings from download and median readings:   PRE-MEAL  2-7 PM  PC dinner Dinner Bedtime Overall  Glucose range:  44-600+  48     Mean/median:      267   POST-MEAL PC Breakfast PC Lunch PC Dinner  Glucose range:     Mean/median:        PREVIOUS readings 2 PM readings range from 38 up to 317 6-7 PM readings range from 32 up to 494 Overall average 240    Hypoglycemia:  As above Symptoms of hypoglycemia: Feeling tired, sweaty, slurred speech, weakness Treatment of hypoglycemia: Juice usually, sometimes glucose tablets, usually 2 at a time     Self-care: The diet that the patient  has been following is: None  Mealtimes are: Very variable              Dietician consultation: Most recent: 06/2018     CDE consultation: Years ago  Diabetes labs:  Lab Results  Component Value Date   HGBA1C 9.3 (A) 06/11/2019   HGBA1C 8.0 (A) 03/05/2019   HGBA1C 9.0 (A) 10/03/2018   Lab Results  Component Value Date   LDLCALC 86 03/05/2019   CREATININE 2.82 (H) 03/05/2019    No results found for: MICRALBCREAT  Wt Readings from Last 3 Encounters:  07/23/19 149 lb 3.2 oz (67.7 kg)  06/11/19 154 lb 3.2 oz (69.9 kg)  04/16/19 141 lb (64 kg)     Allergies as of 07/23/2019      Reactions   Fructose Other (See Comments)   Increase of blood sugar      Medication List       Accurate as of July 23, 2019  8:20 PM. If you have any questions, ask your nurse or doctor.        acetaminophen 325 MG tablet Commonly known as:  TYLENOL Take 1-2 tablets (325-650 mg total) by mouth every 4 (four) hours as needed for mild pain.   amLODipine 10 MG tablet Commonly known as: NORVASC Take 1 tablet (10 mg total) by mouth daily.   aspirin 325 MG EC tablet TAKE 1 TABLET BY MOUTH EVERY DAY   atorvastatin 80 MG tablet Commonly known as: LIPITOR Take 1 tablet (80 mg total) by mouth daily at 6 PM.   clopidogrel 75 MG tablet Commonly known as: PLAVIX Take 75 mg by mouth daily.   glucagon 1 MG injection Inject 1 mg into the vein once as needed for up to 1 dose.   insulin aspart 100 UNIT/ML FlexPen Commonly known as: NOVOLOG Inject 8 Units into the skin 3 (three) times daily with meals.   lisinopril 40 MG tablet Commonly known as: ZESTRIL Take 1 tablet (40 mg total) by mouth daily.   OneTouch Ultra test strip Generic drug: glucose blood USE TO TEST BLOOD SUGAR 4 TIMES A DAY (INSURANCE WILL ONLY PAY FOR HIM TO TEST 3 TIMES A DAY)   Pen Needles 3/16" 31G X 5 MM Misc Four times day   Tresiba FlexTouch 100 UNIT/ML Sopn FlexTouch Pen Generic drug: insulin degludec Inject 0.1 mLs (10 Units total) into the skin daily. Inject 5-6 units under the skin once daily. Hold for low blood sugars. What changed:   how much to take  how to take this  when to take this  additional instructions Changed by: Elayne Snare, MD       Allergies:  Allergies  Allergen Reactions  . Fructose Other (See Comments)    Increase of blood sugar     Past Medical History:  Diagnosis Date  . Diabetes mellitus    lantus/novolog  . Hyperlipidemia   . Hypertension   . Marijuana abuse    occaisionally  . Noncompliance with medication regimen   . Stroke ([place])   . Tobacco abuse    5/day    Past Surgical History:  Procedure Laterality Date  . EYE SURGERY      Family History  Problem Relation Age of Onset  . Diabetes Father   . Kidney failure Father        last 4 mnths of life  . Hypertension Mother   . Diabetes Maternal  Grandmother   . Pancreatic cancer Maternal Grandfather   . Diabetes  Paternal Grandfather     Social History:  reports that he has been smoking cigarettes. He has a 2.00 pack-year smoking history. He has never used smokeless tobacco. He reports current drug use. Drug: Marijuana. He reports that he does not drink alcohol.      Review of Systems      Lipids: After his CVA in June 2019 he was started on Lipitor 80 mg, last LDL as follows    Lab Results  Component Value Date   CHOL 142 03/05/2019   HDL 36.90 (L) 03/05/2019   LDLCALC 86 03/05/2019   TRIG 97.0 03/05/2019   CHOLHDL 4 03/05/2019   HYPERTENSION: Has been on treatment for several years  Blood pressure medications including lisinopril 40 mg, and amlodipine are being prescribed by his PCP Blood pressure also followed by nephrologist   BP Readings from Last 3 Encounters:  07/23/19 (!) 160/100  06/11/19 (!) 160/80  04/16/19 100/62    Has chronic kidney disease, recently seen by nephrologist and further work-up done but written results not available, 1.75  Lab Results  Component Value Date   CREATININE 2.82 (H) 03/05/2019   CREATININE 1.64 (H) 07/07/2018   CREATININE 1.99 (H) 03/08/2018   He has had a thyroid nodule on previous exam  Lab Results  Component Value Date   TSH 0.97 03/06/2019       LABS:  No visits with results within 1 Week(s) from this visit.  Latest known visit with results is:  Office Visit on 06/11/2019  Component Date Value Ref Range Status  . Hemoglobin A1C 06/11/2019 9.3* 4.0 - 5.6 % Final    Physical Examination:  BP (!) 160/100 (BP Location: Left Arm, Patient Position: Sitting, Cuff Size: Normal)   Pulse 94   Ht 6\' 2"  (1.88 m)   Wt 149 lb 3.2 oz (67.7 kg)   SpO2 99%   BMI 19.16 kg/m     ASSESSMENT:  Diabetes type 1, persistently poorly controlled  A1c is 9.3%  A1c does not appear to correlate with his blood sugars at home   Problems identified:  He has  difficulty understanding mealtime insulin coverage  He has not taken much NovoLog and only once or twice  This is despite getting significant amount of carbohydrate with every meal  Also because of lack of adequate glucose monitoring especially postprandial not clear what his true patterns are  He has variable fasting readings also and difficult to determine his [name] dose  Not clear why his blood sugars fasting may be either in the 40s or occasionally over 600  At least once he has forgotten his [name]  Although his mother can sometimes help with his management this is not apparently happening much    HYPERTENSION: Inadequately controlled, followed by nephrologist  CKD: Stable   PLAN:   Glucose monitoring: He was again advised to check sugars 4 hours after each meal consistently.  Diabetes education: Patient will need to be scheduled again for follow-up with diabetes educator  We will also try to do a professional CGM to enable assessment of his blood sugar patterns  3.  Insulin regimen   Since his fasting readings are mostly high he will start with 7 units of Tresiba and adjust the dose every 3 days by 1 unit with a target of 100-180 fasting  Mealtime dose will need to be calculated to be 1: 5 carbohydrate ratio to start with  Again discussed the actions of mealtime insulin, need to adjust basal insulin  based on fasting readings, assessment of postprandial readings and to check blood sugars and keep a record of insulin for each meal for at least a week before his next visit    Patient Instructions  Check blood sugars on waking up EVERY DAY  Also check blood sugars about 2 hours after meals and do this after different meals by rotation  Recommended blood sugar levels on waking up are 90-130 and about 2 hours after meal is 130-160  Please bring your blood sugar monitor to each visit, thank you  NOVOLOG: Must take this at the start of every meal unless you are  only eating a low-fat protein and no carbohydrate  Divide total Carbs in the meal and divide by 5 to get the NOVOLOG dose  Take 7 Tresiba daily starting tonight As before, adjust the dose up or down 1 unit every 3 days if the blood sugar on waking up is either over 180 or below 100     Counseling time on subjects discussed in assessment and plan sections is over 50% of today's 25 minute visit   Elayne Snare 07/23/2019, 8:20 PM      Note: This note was prepared with Dragon voice recognition system technology. Any transcriptional errors that result from this process are unintentional.

## 2019-07-23 NOTE — Patient Instructions (Addendum)
Check blood sugars on waking up EVERY DAY  Also check blood sugars about 2 hours after meals and do this after different meals by rotation  Recommended blood sugar levels on waking up are 90-130 and about 2 hours after meal is 130-160  Please bring your blood sugar monitor to each visit, thank you  NOVOLOG: Must take this at the start of every meal unless you are only eating a low-fat protein and no carbohydrate  Divide total Carbs in the meal and divide by 5 to get the NOVOLOG dose  Take 7 Tresiba daily starting tonight As before, adjust the dose up or down 1 unit every 3 days if the blood sugar on waking up is either over 180 or below 100

## 2019-07-24 ENCOUNTER — Other Ambulatory Visit: Payer: Self-pay

## 2019-07-24 MED ORDER — TRESIBA FLEXTOUCH 100 UNIT/ML ~~LOC~~ SOPN: PEN_INJECTOR | SUBCUTANEOUS | 0 refills | Status: DC

## 2019-09-25 ENCOUNTER — Other Ambulatory Visit: Payer: Self-pay

## 2019-09-25 ENCOUNTER — Encounter: Payer: Medicare HMO | Attending: Endocrinology | Admitting: Nutrition

## 2019-09-25 ENCOUNTER — Encounter: Payer: Self-pay | Admitting: Endocrinology

## 2019-09-25 ENCOUNTER — Ambulatory Visit (INDEPENDENT_AMBULATORY_CARE_PROVIDER_SITE_OTHER): Payer: Medicare HMO | Admitting: Endocrinology

## 2019-09-25 VITALS — BP 138/90 | HR 76 | Ht 74.0 in | Wt 150.8 lb

## 2019-09-25 DIAGNOSIS — E1021 Type 1 diabetes mellitus with diabetic nephropathy: Secondary | ICD-10-CM

## 2019-09-25 DIAGNOSIS — E1065 Type 1 diabetes mellitus with hyperglycemia: Secondary | ICD-10-CM | POA: Diagnosis not present

## 2019-09-25 LAB — POCT GLYCOSYLATED HEMOGLOBIN (HGB A1C): Hemoglobin A1C: 11 % — AB (ref 4.0–5.6)

## 2019-09-25 MED ORDER — TRESIBA FLEXTOUCH 100 UNIT/ML ~~LOC~~ SOPN: PEN_INJECTOR | SUBCUTANEOUS | 0 refills | Status: DC

## 2019-09-25 NOTE — Progress Notes (Signed)
Patient ID: Nathaniel [name], male   DOB: March 25, 1986, 34 y.o.   MRN: [id number]           Reason for Appointment : Follow-up for Type 1 Diabetes  History of Present Illness           Diagnosis: Type 1 diabetes mellitus, date of diagnosis:  ?  2004        Previous history:  He has had recurrent hospitalizations for ketoacidosis especially in 2017 and 2018, last episode was in 01/2018 Overall has had consistently poor control with A1c as high as 16 In the past he has required large doses of at least basal insulin, taking up to 200 units of Lantus daily at some point  Recent history:     INSULIN regimen is: Novolog 5-10 units before dinner.  Tresiba 10 units daily in pm  His A1c is higher at 11, previously 9.3  Current management, blood sugar patterns and problems identified:    Glucose monitoring:  As before he has monitored blood sugars only rarely has only started checking 6 days ago  Today's fasting reading was 101 but on Saturday it was 440  Blood sugars after 7 PM are over 300 with only one normal reading  Basal insulin regimen:  He says he is now taking 10 units of Tresiba, previously taking about 5 units and had recommended increasing it on the last visit in November  However not clear if this is adequately controlling his fasting readings  He thinks that sometimes blood sugar may be high in the morning because of what he eats the night before  Mealtime coverage/diet:  With his blood sugars being checked only rarely not clear whether he is taking his insulin coverage consistently  He again admits that he does not always take his NovoLog  He is eating at erratic times of the day and no fixed meals  However he is trying to avoid high carbohydrate intake such as pancakes better  Hypoglycemia: Not documented  Glucose monitoring:  is being done mostly 1 times a day         Glucometer:  One Touch ultra 2      Blood Glucose readings as above  Recent average  264    PRE-MEAL  2-7 PM  PC dinner Dinner Bedtime Overall  Glucose range:  44-600+  48     Mean/median:      267   POST-MEAL PC Breakfast PC Lunch PC Dinner  Glucose range:     Mean/median:        Hypoglycemia:  As above Symptoms of hypoglycemia: Feeling tired, sweaty, slurred speech, weakness Treatment of hypoglycemia: Juice usually, sometimes glucose tablets, usually 2 at a time     Self-care: The diet that the patient has been following is: None  Mealtimes are: Very variable              Dietician consultation: Most recent: 06/2018     CDE consultation: Years ago  Diabetes labs:  Lab Results  Component Value Date   HGBA1C 11.0 (A) 09/25/2019   HGBA1C 9.3 (A) 06/11/2019   HGBA1C 8.0 (A) 03/05/2019   Lab Results  Component Value Date   LDLCALC 86 03/05/2019   CREATININE 2.82 (H) 03/05/2019    No results found for: MICRALBCREAT  Wt Readings from Last 3 Encounters:  09/25/19 150 lb 12.8 oz (68.4 kg)  07/23/19 149 lb 3.2 oz (67.7 kg)  06/11/19 154 lb 3.2 oz (69.9 kg)  Allergies as of 09/25/2019      Reactions   Fructose Other (See Comments)   Increase of blood sugar      Medication List       Accurate as of September 25, 2019  3:44 PM. If you have any questions, ask your nurse or doctor.        acetaminophen 325 MG tablet Commonly known as: TYLENOL Take 1-2 tablets (325-650 mg total) by mouth every 4 (four) hours as needed for mild pain.   amLODipine 10 MG tablet Commonly known as: NORVASC Take 1 tablet (10 mg total) by mouth daily.   aspirin 325 MG EC tablet TAKE 1 TABLET BY MOUTH EVERY DAY   atorvastatin 80 MG tablet Commonly known as: LIPITOR Take 1 tablet (80 mg total) by mouth daily at 6 PM.   clopidogrel 75 MG tablet Commonly known as: PLAVIX Take 75 mg by mouth daily.   glucagon 1 MG injection Inject 1 mg into the vein once as needed for up to 1 dose.   insulin aspart 100 UNIT/ML FlexPen Commonly known as: NOVOLOG Inject 8 Units  into the skin 3 (three) times daily with meals.   lisinopril 40 MG tablet Commonly known as: ZESTRIL Take 1 tablet (40 mg total) by mouth daily.   OneTouch Ultra test strip Generic drug: glucose blood USE TO TEST BLOOD SUGAR 4 TIMES A DAY (INSURANCE WILL ONLY PAY FOR HIM TO TEST 3 TIMES A DAY)   Pen Needles 3/16" 31G X 5 MM Misc Four times day   Tresiba FlexTouch 100 UNIT/ML Sopn FlexTouch Pen Generic drug: insulin degludec Inject 7 units under the skin once daily. Increase by 1 unit every 3 days until fasting sugar is 100-180 . What changed: additional instructions       Allergies:  Allergies  Allergen Reactions  . Fructose Other (See Comments)    Increase of blood sugar     Past Medical History:  Diagnosis Date  . Diabetes mellitus    lantus/novolog  . Hyperlipidemia   . Hypertension   . Marijuana abuse    occaisionally  . Noncompliance with medication regimen   . Stroke ([place])   . Tobacco abuse    5/day    Past Surgical History:  Procedure Laterality Date  . EYE SURGERY      Family History  Problem Relation Age of Onset  . Diabetes Father   . Kidney failure Father        last 4 mnths of life  . Hypertension Mother   . Diabetes Maternal Grandmother   . Pancreatic cancer Maternal Grandfather   . Diabetes Paternal Grandfather     Social History:  reports that he has been smoking cigarettes. He has a 2.00 pack-year smoking history. He has never used smokeless tobacco. He reports current drug use. Drug: Marijuana. He reports that he does not drink alcohol.      Review of Systems      Lipids: After his CVA in June 2019 he was started on Lipitor 80 mg, last LDL as follows    Lab Results  Component Value Date   CHOL 142 03/05/2019   HDL 36.90 (L) 03/05/2019   LDLCALC 86 03/05/2019   TRIG 97.0 03/05/2019   CHOLHDL 4 03/05/2019   HYPERTENSION: Has been on treatment for several years from nephrologist  Blood pressure medications including  lisinopril 40 mg, and amlodipine are being prescribed by his PCP Not clear if he is taking his medications consistently and  has not taken any today Blood pressure was high on his first measurement today  BP Readings from Last 3 Encounters:  09/25/19 138/90  07/23/19 (!) 160/100  06/11/19 (!) 160/80    Has chronic kidney disease, seen by nephrologist Last creatinine 2.2 on 10/20  Lab Results  Component Value Date   CREATININE 2.82 (H) 03/05/2019   CREATININE 1.64 (H) 07/07/2018   CREATININE 1.99 (H) 03/08/2018   He has had a thyroid nodule on previous exam  Lab Results  Component Value Date   TSH 0.97 03/06/2019       LABS:  Office Visit on 09/25/2019  Component Date Value Ref Range Status  . Hemoglobin A1C 09/25/2019 11.0* 4.0 - 5.6 % Final    Physical Examination:  BP 138/90 (BP Location: Left Arm, Patient Position: Sitting, Cuff Size: Normal)   Pulse 76   Ht 6\' 2"  (1.88 m)   Wt 150 lb 12.8 oz (68.4 kg)   SpO2 96%   BMI 19.36 kg/m     ASSESSMENT:  Diabetes type 1, persistently poorly controlled  A1c is 11%, previously 9.3%   Problems identified:  He has poor compliance with all regimens of self-care  Not motivated to check his sugars and only started doing his monitoring 6 days ago with only few readings in the evenings  Difficult to assess his fasting blood sugar patterns since he has only 2 readings with 1 significantly high reading  However no hypoglycemia with increasing [name] but needs to check more readings in the morning to help assess his requirement  Variable compliance with taking NovoLog before meals  Erratic eating patterns  Not being able to adjust NovoLog based on what he is eating and likely not able to count carbohydrates  Not eligible for continuous glucose monitoring because of lack of documented home blood sugar testing   HYPERTENSION: Inadequately controlled, need to be treated by nephrologist  DEPRESSION: Discussed  that he needs to be seen by his PCP for evaluation and recommendations, currently is reluctant to consider counseling   PLAN:   Glucose monitoring: He was again advised to the need to check sugars 4 times a day especially before and after each meal and also on waking up. We do a professional CGM for 2 weeks starting today to enable assessment of his blood sugar patterns However emphasized to him that this will be only useful if he keeps a detailed diary of his food intake, insulin doses and keep his sensor on for download in 2 weeks  Diabetes education: To be seen by diabetes educator today and also when he has his sensor removed  Mealtime insulin will be adjusted further based on his CGM interpretation and home blood sugar diary which should also include insulin doses for each meal   There are no Patient Instructions on file for this visit.    Elayne Snare 09/25/2019, 3:44 PM      Note: This note was prepared with Dragon voice recognition system technology. Any transcriptional errors that result from this process are unintentional.

## 2019-09-28 ENCOUNTER — Other Ambulatory Visit: Payer: Self-pay | Admitting: Endocrinology

## 2019-10-06 NOTE — Patient Instructions (Signed)
Keep a log of what you eat and your insulin doses with times of day Return log and sensor in 2 weeks, in a plastic bag. Call if questions.

## 2019-10-06 NOTE — Progress Notes (Addendum)
Professional CGM was put into patient's left arm.  We discussed what this does and how it works.  He was given a diet history and shown how to log what he eats and his insulin dose.  He agreed to do this, and his mother said she would monitor this as well.  He was told that if he did well, and tests his blood sugar 4X/day, he can get a home one.  He was encouraged by this, and they both had no final questions.  Procedure note reviewed and approved

## 2019-10-15 ENCOUNTER — Other Ambulatory Visit: Payer: Self-pay

## 2019-10-16 ENCOUNTER — Telehealth: Payer: Self-pay

## 2019-10-16 ENCOUNTER — Encounter: Payer: Self-pay | Admitting: Endocrinology

## 2019-10-16 ENCOUNTER — Ambulatory Visit (INDEPENDENT_AMBULATORY_CARE_PROVIDER_SITE_OTHER): Payer: Medicare HMO | Admitting: Endocrinology

## 2019-10-16 VITALS — BP 122/70 | HR 86 | Ht 74.0 in | Wt 144.0 lb

## 2019-10-16 DIAGNOSIS — E160 Drug-induced hypoglycemia without coma: Secondary | ICD-10-CM

## 2019-10-16 DIAGNOSIS — T383X5A Adverse effect of insulin and oral hypoglycemic [antidiabetic] drugs, initial encounter: Secondary | ICD-10-CM

## 2019-10-16 DIAGNOSIS — E1065 Type 1 diabetes mellitus with hyperglycemia: Secondary | ICD-10-CM | POA: Diagnosis not present

## 2019-10-16 MED ORDER — TOUJEO SOLOSTAR 300 UNIT/ML ~~LOC~~ SOPN: 10.0000 [IU] | PEN_INJECTOR | Freq: Every day | SUBCUTANEOUS | 1 refills | Status: DC

## 2019-10-16 NOTE — Progress Notes (Signed)
Patient ID: Nathaniel [name], male   DOB: 07/15/1986, 34 y.o.   MRN: [id number]           Reason for Appointment : Follow-up for Type 1 Diabetes  History of Present Illness           Diagnosis: Type 1 diabetes mellitus, date of diagnosis:  ?  2004        Previous history:  He has had recurrent hospitalizations for ketoacidosis especially in 2017 and 2018, last episode was in 01/2018 Overall has had consistently poor control with A1c as high as 16 In the past he has required large doses of at least basal insulin, taking up to 200 units of Lantus daily at some point  Recent history:     INSULIN regimen is: Novolog 5-6 units before dinner.  Tresiba 12 units daily in pm  His A1c is higher at 11, previously 9.3  Current management, blood sugar patterns and problems identified:    Glucose monitoring:  He did not bring his monitor for download  Previously had done insufficient monitoring and is here for review of his professional CGM  Results are discussed below and indicate very significant variability in blood sugars and over 50% of the readings over 250 along with 15% of the readings below 70  Basal insulin regimen:  Is now taking 12 units of Tresiba, previously taking 10 units and not clear why he has increased it  He takes this around 6 PM in the evening and not clear how he is making any adjustments, does not have consistently high readings before his first meal of the day  Mealtime coverage/diet:  He now says that he rarely takes NovoLog because he is not eating full meals but his mother thinks he is sometimes having meals without any NovoLog coverage  Does not know how to count carbohydrates and is empirically taking 5 or 6 units sometimes  His mealtimes are highly irregular and he has variable intake at mealtimes  Hypoglycemia: Not documented  Glucose monitoring:  is being done mostly 1 times a day         Glucometer:  One Touch ultra 2      Blood Glucose readings  not available   CONTINUOUS GLUCOSE MONITORING RECORD INTERPRETATION    Dates of Recording: 1/6 through 1/19  Sensor description: [employer] professional  Results statistics:   CGM use % of time  100  Average and SD  283, GV 58  Time in range    18    %  % Time Above 180  11  % Time above 250  56  % Time Below target  15    PRE-MEAL Fasting Lunch Dinner Bedtime Overall  Glucose range:       Mean/median:        POST-MEAL PC Breakfast PC Lunch PC Dinner  Glucose range:     Mean/median:       Glycemic patterns summary: Blood sugars are showing extreme variability at all times HIGHEST blood sugars are late at night rising after about 9 PM and not starting to come down to at least 7 AM He has frequent hypoglycemia on about half of the days studied with more frequent hypoglycemia between 1 PM and 6 PM Average blood sugar on any given day varies between 130 and 431  Hyperglycemic episodes are in the evenings and overnight but not consistent every day However average blood sugars are as high as over 400 between midnight and 4  AM  Hypoglycemic episodes occurred for variable durations up to 12 hours and mostly between 1-6 PM Blood sugar was in the hypoglycemic range on about half the days study  Overnight periods: Blood sugars are fairly consistently over 350 overnight and very starting to come down after about 8 AM on an average but with marked variability  Preprandial periods: Difficult to assess since he does not have any fixed mealtimes  Postprandial periods:   Also unable to assess because he did not keep a diary and mealtimes are no certain time of the day As above blood sugars are rising progressively higher in the early evening and very consistently high after about 11 PM until 4 AM  Previous readings:   PRE-MEAL  2-7 PM  PC dinner Dinner Bedtime Overall  Glucose range:  44-600+  48     Mean/median:      267   POST-MEAL PC Breakfast PC Lunch PC Dinner   Glucose range:     Mean/median:        Hypoglycemia:  As above Symptoms of hypoglycemia: Feeling tired, sweaty, slurred speech, weakness Treatment of hypoglycemia: Juice usually, sometimes glucose tablets, usually 2 at a time     Self-care: The diet that the patient has been following is: None  Mealtimes are: Very variable              Dietician consultation: Most recent: 06/2018     CDE consultation: Years ago  Diabetes labs:  Lab Results  Component Value Date   HGBA1C 11.0 (A) 09/25/2019   HGBA1C 9.3 (A) 06/11/2019   HGBA1C 8.0 (A) 03/05/2019   Lab Results  Component Value Date   LDLCALC 86 03/05/2019   CREATININE 2.82 (H) 03/05/2019    No results found for: MICRALBCREAT  Wt Readings from Last 3 Encounters:  10/16/19 144 lb (65.3 kg)  09/25/19 150 lb 12.8 oz (68.4 kg)  07/23/19 149 lb 3.2 oz (67.7 kg)     Allergies as of 10/16/2019      Reactions   Fructose Other (See Comments)   Increase of blood sugar      Medication List       Accurate as of October 16, 2019 11:59 PM. If you have any questions, ask your nurse or doctor.        STOP taking these medications   Tresiba FlexTouch 100 UNIT/ML Sopn FlexTouch Pen Generic drug: insulin degludec Stopped by: Elayne Snare, MD     TAKE these medications   acetaminophen 325 MG tablet Commonly known as: TYLENOL Take 1-2 tablets (325-650 mg total) by mouth every 4 (four) hours as needed for mild pain.   amLODipine 10 MG tablet Commonly known as: NORVASC Take 1 tablet (10 mg total) by mouth daily.   aspirin 325 MG EC tablet TAKE 1 TABLET BY MOUTH EVERY DAY   atorvastatin 80 MG tablet Commonly known as: LIPITOR Take 1 tablet (80 mg total) by mouth daily at 6 PM.   clopidogrel 75 MG tablet Commonly known as: PLAVIX Take 75 mg by mouth daily.   glucagon 1 MG injection Inject 1 mg into the vein once as needed for up to 1 dose.   insulin aspart 100 UNIT/ML FlexPen Commonly known as: NOVOLOG Inject 8  Units into the skin 3 (three) times daily with meals. What changed: how much to take   lisinopril 40 MG tablet Commonly known as: ZESTRIL Take 1 tablet (40 mg total) by mouth daily.   OneTouch Ultra test strip  Generic drug: glucose blood USE TO TEST BLOOD SUGAR 4 TIMES A DAY (INSURANCE WILL ONLY PAY FOR HIM TO TEST 3 TIMES A DAY)   Pen Needles 3/16" 31G X 5 MM Misc Four times day   Toujeo SoloStar 300 UNIT/ML Sopn Generic drug: Insulin Glargine (1 Unit Dial) Inject 10 Units into the skin daily. Started by: Elayne Snare, MD       Allergies:  Allergies  Allergen Reactions  . Fructose Other (See Comments)    Increase of blood sugar     Past Medical History:  Diagnosis Date  . Diabetes mellitus    lantus/novolog  . Hyperlipidemia   . Hypertension   . Marijuana abuse    occaisionally  . Noncompliance with medication regimen   . Stroke ([place])   . Tobacco abuse    5/day    Past Surgical History:  Procedure Laterality Date  . EYE SURGERY      Family History  Problem Relation Age of Onset  . Diabetes Father   . Kidney failure Father        last 4 mnths of life  . Hypertension Mother   . Diabetes Maternal Grandmother   . Pancreatic cancer Maternal Grandfather   . Diabetes Paternal Grandfather     Social History:  reports that he has been smoking cigarettes. He has a 2.00 pack-year smoking history. He has never used smokeless tobacco. He reports current drug use. Drug: Marijuana. He reports that he does not drink alcohol.      Review of Systems      Lipids: After his CVA in June 2019 he was started on Lipitor 80 mg, last LDL as follows    Lab Results  Component Value Date   CHOL 142 03/05/2019   HDL 36.90 (L) 03/05/2019   LDLCALC 86 03/05/2019   TRIG 97.0 03/05/2019   CHOLHDL 4 03/05/2019   HYPERTENSION: Has been on treatment for several years from nephrologist  Blood pressure medications including lisinopril 40 mg, and amlodipine are being prescribed  by his PCP   BP Readings from Last 3 Encounters:  10/16/19 122/70  09/25/19 138/90  07/23/19 (!) 160/100    Has chronic kidney disease, seen by nephrologist Last creatinine 2.2 on 10/20  Lab Results  Component Value Date   CREATININE 2.82 (H) 03/05/2019   CREATININE 1.64 (H) 07/07/2018   CREATININE 1.99 (H) 03/08/2018   He has had a thyroid nodule on previous exam  Lab Results  Component Value Date   TSH 0.97 03/06/2019      LABS:  No visits with results within 1 Week(s) from this visit.  Latest known visit with results is:  Office Visit on 09/25/2019  Component Date Value Ref Range Status  . Hemoglobin A1C 09/25/2019 11.0* 4.0 - 5.6 % Final    Physical Examination:  BP 122/70 (BP Location: Left Arm, Patient Position: Sitting, Cuff Size: Normal)   Pulse 86   Ht 6\' 2"  (1.88 m)   Wt 144 lb (65.3 kg)   SpO2 96%   BMI 18.49 kg/m     ASSESSMENT:  Diabetes type 1, persistently poorly controlled  A1c is last 11%, previously 9.3%   Problems identified:  His CGM indicates marked hyperglycemia but also periods of hypoglycemia  Most likely he is getting prolonged action of [name] which periodically causes significant hypoglycemia  However since he is taking minimal and infrequent doses of NovoLog he tends to have severe hypoglycemia when he is eating late in the evening  and overnight  He may not always have symptoms of hypoglycemia   HYPERTENSION: Improved  PLAN:   Discussed importance of mealtime insulin compared to basal insulin and his management He was given detailed instructions on his insulin regimen To avoid prolonged activity of basal insulin especially with his renal insufficiency he will switch to Toujeo instead of Tresiba, currently Levemir does not appear to be covered by his insurance He will only start with 10 units but given him a flowsheet with instructions on how to adjust it based on his blood sugar every 3 days His mother will also  need to help him make some adjustments This will be based on his fasting blood sugar when he wakes up He will reduce the dose with fasting readings being low  Mealtime insulin needs to be taken consistently with every meal or large snack with any carbohydrate He will do a 1: 10 carbohydrate coverage along with correction doses of 3 to 7 units when he is eating or anytime the blood sugar is high Explained how to take the NovoLog on a regular basis He does need more specific reinforcement of his instructions and follow-up with the nurse educator but he refuses   Patient Instructions   TOUJEO insulin: This insulin provides blood sugar control when you are not eating only  Start with 10 units at waking up time daily and increase by 1 units every 3 days until the waking up sugars are under 140. Then continue the same dose.   If blood sugar is under 90 for 2 days in a row, reduce the dose by 2 units.  Note that this insulin does not control the rise of blood sugar with meals and need to take NovoLog before meals  NOVOLOG insulin: This can be taken up to 4 times a day  Preferably look up the amount of carbohydrates in your meal or snack on the box or check on Google  And the total amount of carbohydrate and then divide by 10 to get the NovoLog dose for the meal to be taken at the start of the meal or snack  If the blood sugar is over 200 take NovoLog in addition to the above dose or otherwise to bring the sugar down as follows  Blood sugar 200-250: Take 3 units 250-300: Take 5 units Over 300: Take 7 units       Elayne Snare 10/17/2019, 9:21 PM      Note: This note was prepared with Dragon voice recognition system technology. Any transcriptional errors that result from this process are unintentional.

## 2019-10-16 NOTE — Telephone Encounter (Signed)
This patient has declined insulin teaching visit with Linda-FYI

## 2019-10-16 NOTE — Patient Instructions (Addendum)
TOUJEO insulin: This insulin provides blood sugar control when you are not eating only  Start with 10 units at waking up time daily and increase by 1 units every 3 days until the waking up sugars are under 140. Then continue the same dose.   If blood sugar is under 90 for 2 days in a row, reduce the dose by 2 units.  Note that this insulin does not control the rise of blood sugar with meals and need to take NovoLog before meals  NOVOLOG insulin: This can be taken up to 4 times a day  Preferably look up the amount of carbohydrates in your meal or snack on the box or check on Google  And the total amount of carbohydrate and then divide by 10 to get the NovoLog dose for the meal to be taken at the start of the meal or snack  If the blood sugar is over 200 take NovoLog in addition to the above dose or otherwise to bring the sugar down as follows  Blood sugar 200-250: Take 3 units 250-300: Take 5 units Over 300: Take 7 units

## 2019-12-13 ENCOUNTER — Ambulatory Visit: Payer: Medicare HMO | Attending: Internal Medicine

## 2019-12-13 DIAGNOSIS — Z23 Encounter for immunization: Secondary | ICD-10-CM

## 2019-12-13 NOTE — Progress Notes (Signed)
   Cov[facility] Vaccination [facility]  Name:  Nathaniel [name]    MRN: [id number] DOB: 1986/01/15  12/13/2019  [name] was observed post Covid-19 immunization for 15 minutes without incident. He was provided with Vaccine Information Sheet and instruction to access the V-Safe system.   [name] was instructed to call 911 with any severe reactions post vaccine: Marland Kitchen Difficulty breathing  . Swelling of face and throat  . A fast heartbeat  . A bad rash all over body  . Dizziness and weakness   Immunizations Administered    Name Date Dose VIS Date Route   Moderna COVID-19 Vaccine 12/13/2019 11:33 AM 0.5 mL 08/21/2019 Intramuscular   Manufacturer: Moderna   Lot: [id number]   [place]: [id number]

## 2019-12-17 ENCOUNTER — Encounter: Payer: Self-pay | Admitting: Endocrinology

## 2019-12-17 ENCOUNTER — Ambulatory Visit (INDEPENDENT_AMBULATORY_CARE_PROVIDER_SITE_OTHER): Payer: Medicare HMO | Admitting: Endocrinology

## 2019-12-17 ENCOUNTER — Other Ambulatory Visit: Payer: Self-pay

## 2019-12-17 VITALS — BP 200/116 | HR 90 | Ht 74.0 in | Wt 151.8 lb

## 2019-12-17 DIAGNOSIS — I10 Essential (primary) hypertension: Secondary | ICD-10-CM | POA: Diagnosis not present

## 2019-12-17 DIAGNOSIS — E1065 Type 1 diabetes mellitus with hyperglycemia: Secondary | ICD-10-CM | POA: Diagnosis not present

## 2019-12-17 NOTE — Progress Notes (Signed)
Patient ID: Nathaniel [name], male   DOB: 1986/07/24, 34 y.o.   MRN: [id number]           Reason for Appointment : Follow-up for Type 1 Diabetes  History of Present Illness           Diagnosis: Type 1 diabetes mellitus, date of diagnosis:  ?  2004        Previous history:  He has had recurrent hospitalizations for ketoacidosis especially in 2017 and 2018, last episode was in 01/2018 Overall has had consistently poor control with A1c as high as 16 In the past he has required large doses of at least basal insulin, taking up to 200 units of Lantus daily at some point  Recent history:     INSULIN regimen is: Novolog as below Toujeo 10 units daily in pm  His A1c in 1/21 is higher at 11, previously 9.3  Current management, blood sugar patterns and problems identified:    Glucose monitoring:  He did not increase his monitoring frequency and has only sporadic blood sugars  Also generally only taking blood sugar readings when he wakes up in the early evening and not any other time  He is interested in the CGM to use at home but has not monitored blood sugars 4 times a day as required for coverage  His blood sugars are again extremely inconsistent when done around 5-7 PM and no other readings available for other times; has about equal number of low, normal and very high readings  Basal insulin regimen:  Is now taking 10 units of Toujeo, previously taking 12 units of Tresiba units  He was told to adjust the dose based on his fasting blood sugars and was given a flowsheet but has not done so and also not checking blood sugar daily  Over the last 2 weeks has had 3 blood sugars in the 50s in the early evening hours but also 4 readings over 500 He takes basal insulin around 6 PM in the evening which is sometime after he wakes up  Mealtime coverage/diet:  He again says that he rarely takes NovoLog insulin.  He apparently is not eating any set mealtime and probably eating more snacks  than meals.  However he does eat at least one full meal a day  May have taken some NovoLog when the blood sugar is significantly high only  No hypoglycemia documented after meals   Glucose monitoring:  is being done mostly 1 times a day         Glucometer:  One Touch ultra 2      Blood Glucose readings as below  Blood sugar range 53-600+, done between about 5 PM-8 PM AVERAGE 255   PREVIOUS CGM: Dates of Recording: 1/6 through 1/19  Sensor description: [employer] professional  Results statistics:   CGM use % of time  100  Average and SD  283, GV 58  Time in range    18    %  % Time Above 180  11  % Time above 250  56  % Time Below target  15    Glycemic patterns summary: Blood sugars are showing extreme variability at all times HIGHEST blood sugars are late at night rising after about 9 PM and not starting to come down to at least 7 AM He has frequent hypoglycemia on about half of the days studied with more frequent hypoglycemia between 1 PM and 6 PM Average blood sugar on any given day  varies between 130 and 431  Hyperglycemic episodes are in the evenings and overnight but not consistent every day However average blood sugars are as high as over 400 between midnight and 4 AM  Hypoglycemic episodes occurred for variable durations up to 12 hours and mostly between 1-6 PM Blood sugar was in the hypoglycemic range on about half the days study    Hypoglycemia:  As above Symptoms of hypoglycemia: Feeling tired, sweaty, slurred speech, weakness Treatment of hypoglycemia: Juice usually, sometimes glucose tablets, usually 2 at a time     Self-care: The diet that the patient has been following is: None  Mealtimes are: Very variable              Dietician consultation: Most recent: 06/2018     CDE consultation: Years ago  Diabetes labs:  Lab Results  Component Value Date   HGBA1C 11.0 (A) 09/25/2019   HGBA1C 9.3 (A) 06/11/2019   HGBA1C 8.0 (A) 03/05/2019   Lab  Results  Component Value Date   LDLCALC 86 03/05/2019   CREATININE 2.82 (H) 03/05/2019    No results found for: MICRALBCREAT  Wt Readings from Last 3 Encounters:  12/17/19 151 lb 12.8 oz (68.9 kg)  10/16/19 144 lb (65.3 kg)  09/25/19 150 lb 12.8 oz (68.4 kg)     Allergies as of 12/17/2019      Reactions   Fructose Other (See Comments)   Increase of blood sugar      Medication List       Accurate as of December 17, 2019 11:59 PM. If you have any questions, ask your nurse or doctor.        acetaminophen 325 MG tablet Commonly known as: TYLENOL Take 1-2 tablets (325-650 mg total) by mouth every 4 (four) hours as needed for mild pain.   amLODipine 10 MG tablet Commonly known as: NORVASC Take 1 tablet (10 mg total) by mouth daily.   aspirin 325 MG EC tablet TAKE 1 TABLET BY MOUTH EVERY DAY   atorvastatin 80 MG tablet Commonly known as: LIPITOR Take 1 tablet (80 mg total) by mouth daily at 6 PM.   clopidogrel 75 MG tablet Commonly known as: PLAVIX Take 75 mg by mouth daily.   glucagon 1 MG injection Inject 1 mg into the vein once as needed for up to 1 dose.   insulin aspart 100 UNIT/ML FlexPen Commonly known as: NOVOLOG Inject 8 Units into the skin 3 (three) times daily with meals. What changed: how much to take   lisinopril 40 MG tablet Commonly known as: ZESTRIL Take 1 tablet (40 mg total) by mouth daily.   OneTouch Ultra test strip Generic drug: glucose blood USE TO TEST BLOOD SUGAR 4 TIMES A DAY (INSURANCE WILL ONLY PAY FOR HIM TO TEST 3 TIMES A DAY)   Pen Needles 3/16" 31G X 5 MM Misc Four times day   Toujeo SoloStar 300 UNIT/ML Solostar Pen Generic drug: insulin glargine (1 Unit Dial) Inject 10 Units into the skin daily.       Allergies:  Allergies  Allergen Reactions  . Fructose Other (See Comments)    Increase of blood sugar     Past Medical History:  Diagnosis Date  . Diabetes mellitus    lantus/novolog  . Hyperlipidemia   .  Hypertension   . Marijuana abuse    occaisionally  . Noncompliance with medication regimen   . Stroke ([place])   . Tobacco abuse    5/day  Past Surgical History:  Procedure Laterality Date  . EYE SURGERY      Family History  Problem Relation Age of Onset  . Diabetes Father   . Kidney failure Father        last 4 mnths of life  . Hypertension Mother   . Diabetes Maternal Grandmother   . Pancreatic cancer Maternal Grandfather   . Diabetes Paternal Grandfather     Social History:  reports that he has been smoking cigarettes. He has a 2.00 pack-year smoking history. He has never used smokeless tobacco. He reports current drug use. Drug: Marijuana. He reports that he does not drink alcohol.      Review of Systems      Lipids: After his CVA in June 2019 he was started on Lipitor 80 mg, last LDL as follows    Lab Results  Component Value Date   CHOL 142 03/05/2019   HDL 36.90 (L) 03/05/2019   LDLCALC 86 03/05/2019   TRIG 97.0 03/05/2019   CHOLHDL 4 03/05/2019   HYPERTENSION: Has been on treatment for several years from nephrologist  Blood pressure medications including lisinopril 40 mg, and amlodipine are being prescribed by his PCP He did not take his blood pressure medications for 2 days and blood pressure is high  BP Readings from Last 3 Encounters:  12/17/19 (!) 200/116  10/16/19 122/70  09/25/19 138/90    Has chronic kidney disease, seen by nephrologist Last creatinine 2.2 on 10/20  Lab Results  Component Value Date   CREATININE 2.82 (H) 03/05/2019   CREATININE 1.64 (H) 07/07/2018   CREATININE 1.99 (H) 03/08/2018   He has had a thyroid nodule on previous exam  Lab Results  Component Value Date   TSH 0.97 03/06/2019      LABS:  No visits with results within 1 Week(s) from this visit.  Latest known visit with results is:  Office Visit on 09/25/2019  Component Date Value Ref Range Status  . Hemoglobin A1C 09/25/2019 11.0* 4.0 - 5.6 % Final     Physical Examination:  BP (!) 200/116 (BP Location: Left Arm, Patient Position: Sitting, Cuff Size: Normal)   Pulse 90   Ht 6\' 2"  (1.88 m)   Wt 151 lb 12.8 oz (68.9 kg)   SpO2 98%   BMI 19.49 kg/m     ASSESSMENT:  Diabetes type 1, persistently poorly controlled  A1c is last 11%, previously 9.3%   Problems identified:  His compliance and motivation to follow instructions is still very poor  He is only checking blood sugars sporadically and mostly in the early evenings  As before his blood sugars fluctuate very significantly when checked in the evening times which is mostly his waking up time  Unable to explain his blood sugar fluctuation  He does not take mealtime insulin coverage usually and not clear if he is doing this at all are only when the blood sugar is high  He may not always have symptoms of hypoglycemia and has had a few readings below 60  However does not appear to be having hypoglycemia as much with Toujeo compared to [name] and using 10 units   HYPERTENSION: Poorly controlled and needs to take his medications regularly and follow-up with his nephrologist for adjustment.  Explained to him that he has potential for more complications of hypertension including CVA if he does not control his consistently  PLAN:   Again was given detailed instructions on his insulin regimen  He will need to take  coverage for his meals based on his meal size and carbohydrate content Also given his specific instructions on how much to take when blood sugars are high Since he will be needing to take insulin for all his meals and large snacks he needs to check his blood sugars consistently Likely needs to check blood sugars 4 times a day and explained that this will enable him to be eligible for the freestyle libre sensor  No change in Toujeo at the moment We will only increase this or decrease it if he has any consistent pattern He will need to keep a diary of his food  intake and insulin doses as well blood sugars for 3 days and follow-up with nurse educator to review how he is managing his diabetes on a day-to-day basis To avoid prolonged activity of basal insulin especially with his renal insufficiency he will switch to Toujeo instead of Tresiba, currently Levemir does not appear to be covered by his insurance  He will only start with 10 units but given him a flowsheet with instructions on how to adjust it based on his blood sugar every 3 days His mother will also need to help him make some adjustments This will be based on his fasting blood sugar when he wakes up He will reduce the dose with fasting readings being low  Copy of instructions for mealtime insulin:  NOVOLOG insulin: This can be taken up to 4 times a day   Preferably look up the amount of carbohydrates in your meal or snack on the box or check on Google   And the total amount of carbohydrate and then divide by 10 to get the NovoLog dose for the meal to be taken at the start of the meal or snack   If the blood sugar is over 200 take NovoLog in addition to the above dose or otherwise to bring the sugar down as follows   Blood sugar 200-250: Take 3 units 250-300: Take 5 units Over 300: Take 7 units  Patient Instructions  TOUJEO insulin: This insulin provides blood sugar control when you are not eating only   Stay with 10 units at waking up time daily and increase by 1 units every 3 days until the waking up sugars are under 140. Then continue the same dose.    If blood sugar is under 90 for 2 days in a row, reduce the dose by 2 units.  Note that this insulin does not control the rise of blood sugar with meals and need to take NovoLog before meals   NOVOLOG insulin: This can be taken up to 4 times a day   Preferably look up the amount of carbohydrates in your meal or snack on the box or check on Google   And the total amount of carbohydrate and then divide by 10 to get the NovoLog dose  for the meal to be taken at the start of the meal or snack   If the blood sugar is over 200 take NovoLog in addition to the above dose or otherwise to bring the sugar down as follows   Blood sugar 200-250: Take 3 units 250-300: Take 5 units Over 300: Take 7 units     Nathaniel [name] 12/18/2019, 11:44 AM      Note: This note was prepared with Dragon voice recognition system technology. Any transcriptional errors that result from this process are unintentional.

## 2019-12-17 NOTE — Patient Instructions (Signed)
TOUJEO insulin: This insulin provides blood sugar control when you are not eating only   Stay with 10 units at waking up time daily and increase by 1 units every 3 days until the waking up sugars are under 140. Then continue the same dose.    If blood sugar is under 90 for 2 days in a row, reduce the dose by 2 units.  Note that this insulin does not control the rise of blood sugar with meals and need to take NovoLog before meals   NOVOLOG insulin: This can be taken up to 4 times a day   Preferably look up the amount of carbohydrates in your meal or snack on the box or check on Google   And the total amount of carbohydrate and then divide by 10 to get the NovoLog dose for the meal to be taken at the start of the meal or snack   If the blood sugar is over 200 take NovoLog in addition to the above dose or otherwise to bring the sugar down as follows   Blood sugar 200-250: Take 3 units 250-300: Take 5 units Over 300: Take 7 units

## 2020-01-01 IMAGING — MR MR MRA HEAD W/O CM
9 of 11 series · 33 of 48 positions shown · non-contrast
Comparison: Head CT without contrast 02/16/2018. Cervical spine MRI
11/19/2015. Brain MRI 01/09/2014.

CLINICAL DATA: 31-year-old male with progressive right side
numbness in the arm and leg since yesterday. Diabetes.

EXAM:
MRI HEAD WITHOUT CONTRAST
MRA HEAD WITHOUT CONTRAST
TECHNIQUE: Multiplanar, multiecho pulse sequences of the brain and surrounding
structures were obtained without intravenous contrast. Angiographic
images of the head were obtained using MRA technique without
contrast.

[Series 2: [id number]_sag · sagittal · 5.0mm · 0.42mm/px · 1 of 20 slices shown]
[im 1/20]
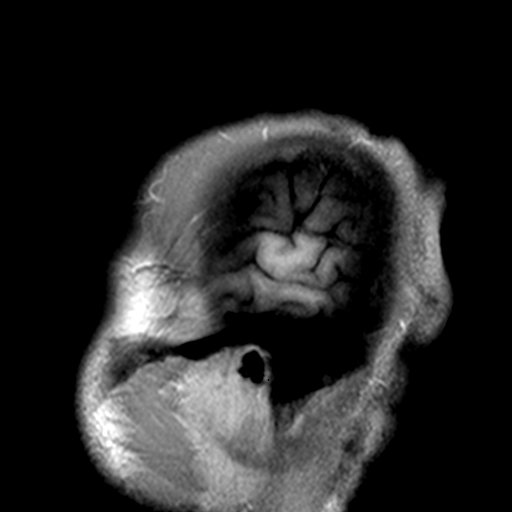

[Series 3: DWI · axial · 3.0mm · 0.70mm/px · z∈[-68,+94]mm · 5 of 55 slices shown (1 of 4)]
[im 1/55]
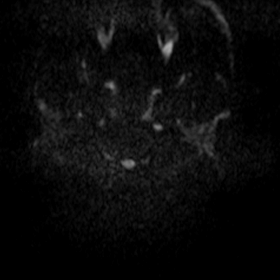
[im 14/55]
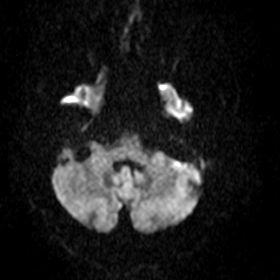
[im 28/55]
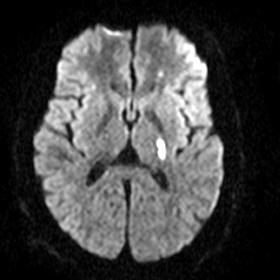
[im 41/55]
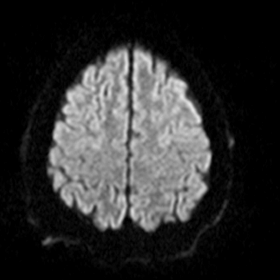
[im 55/55]
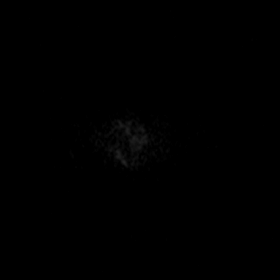

[Series 4: DWI · axial · 3.0mm · 0.77mm/px · z∈[-68,+94]mm · 5 of 53 slices shown (2 of 4)]
[im 1/53]
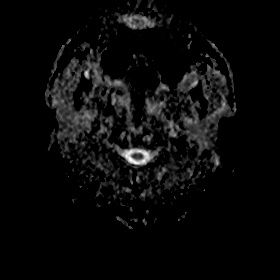
[im 14/53]
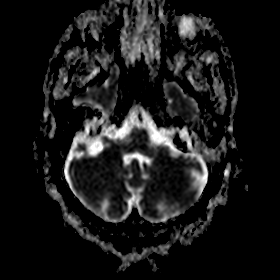
[im 27/53]
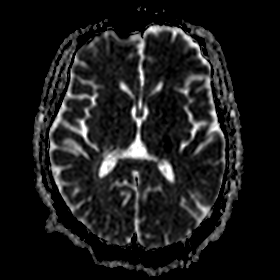
[im 40/53]
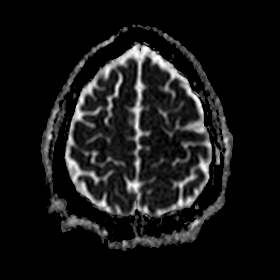
[im 53/53]
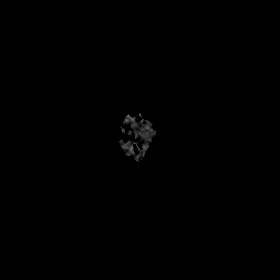

[Series 5: DWI · coronal · 5.0mm · 0.48mm/px · 3 of 34 slices shown (3 of 4)]
[im 1/34]
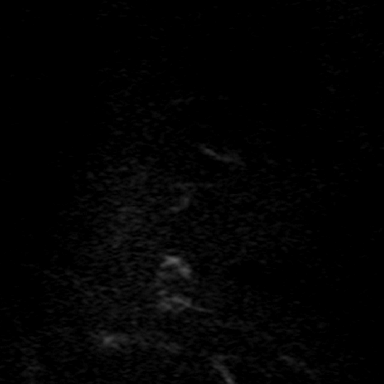
[im 17/34]
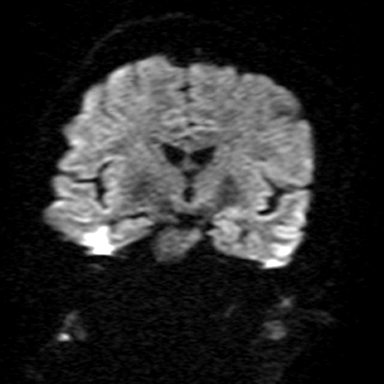
[im 34/34]
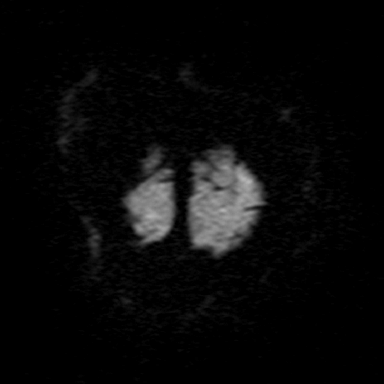

[Series 6: DWI · coronal · 5.0mm · 0.53mm/px · 3 of 33 slices shown (4 of 4)]
[im 1/33]
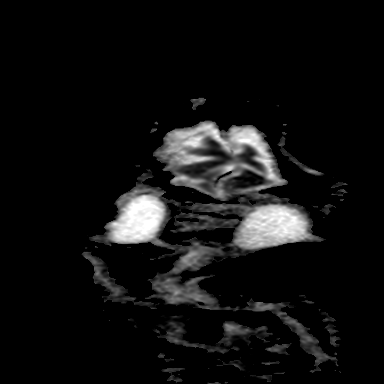
[im 17/33]
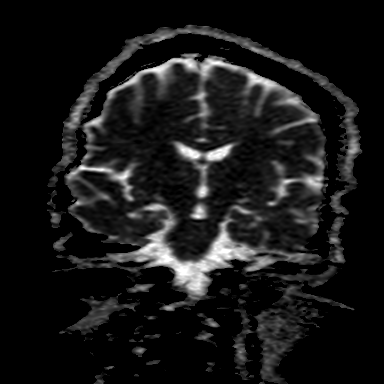
[im 33/33]
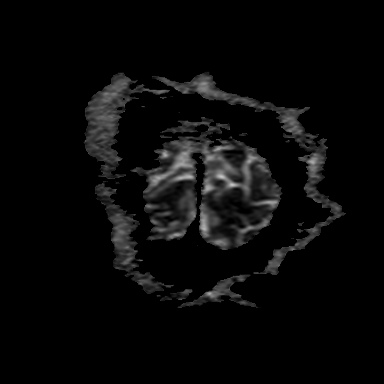

[Series 11: T2 · axial · 5.0mm · 0.68mm/px · z∈[-59,+84]mm · 2 of 23 slices shown (1 of 2)]
[im 1/23]
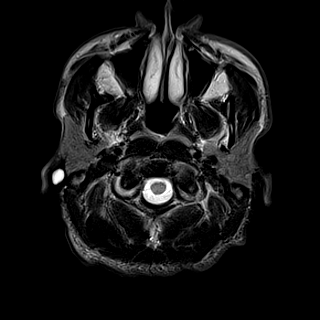
[im 23/23]
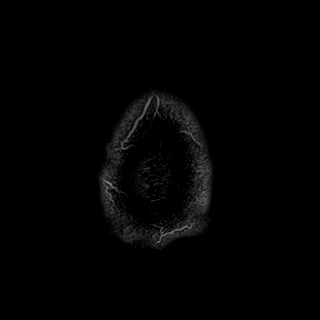

[Series 13: FLAIR · axial · 3.0mm · 0.88mm/px · z∈[-56,+81]mm · 4 of 47 slices shown]
[im 1/47]
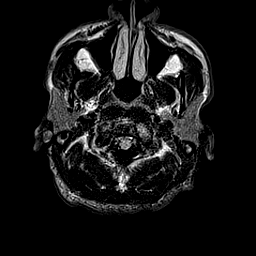
[im 16/47]
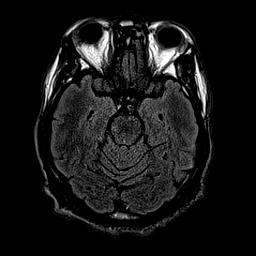
[im 31/47]
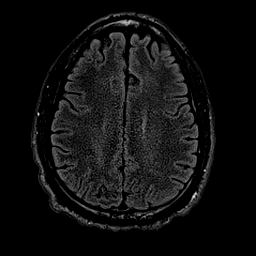
[im 47/47]
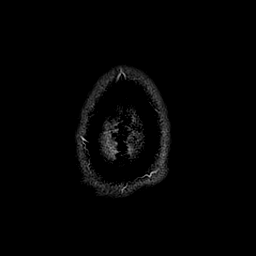

[Series 14: T1 · axial · 2.0mm · 0.43mm/px · z∈[-57,+86]mm · 7 of 73 slices shown]
[im 1/73]
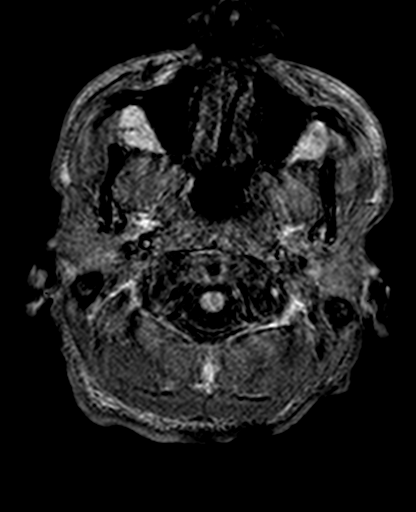
[im 13/73]
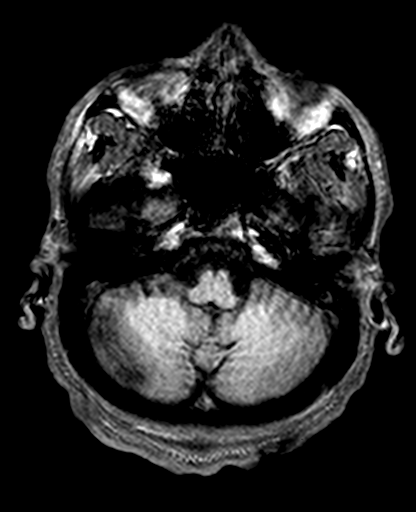
[im 25/73]
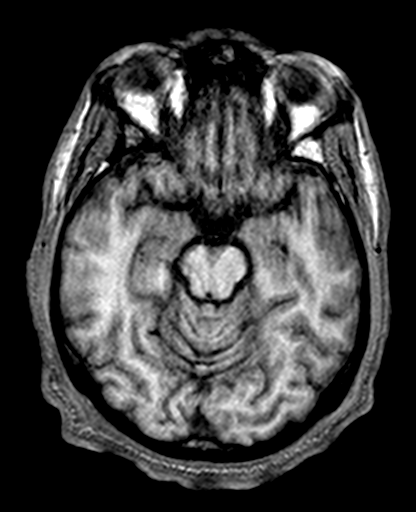
[im 37/73]
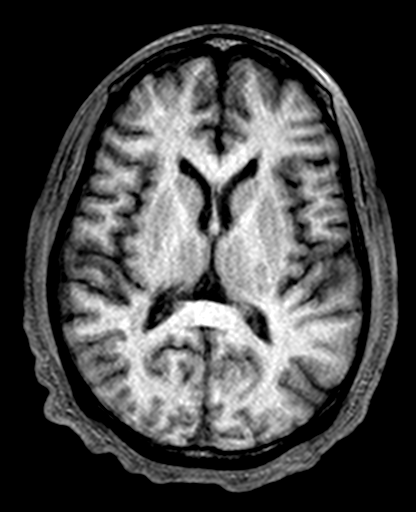
[im 49/73]
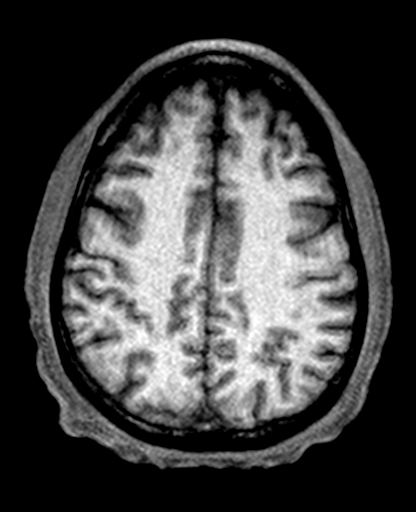
[im 61/73]
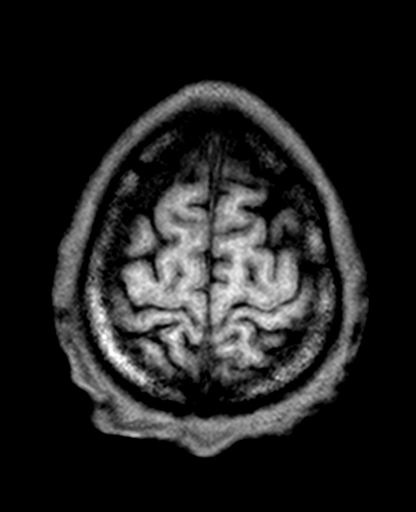
[im 73/73]
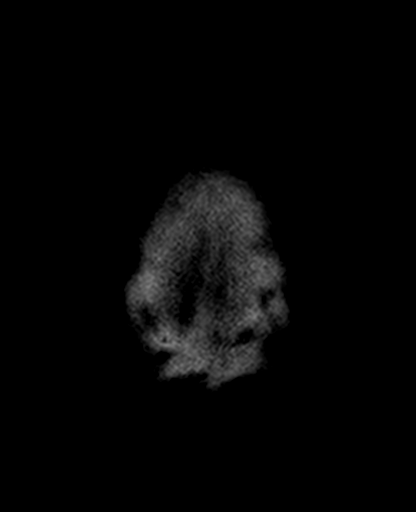

[Series 16: T2 · coronal · 5.0mm · 0.63mm/px · 3 of 28 slices shown (2 of 2)]
[im 1/28]
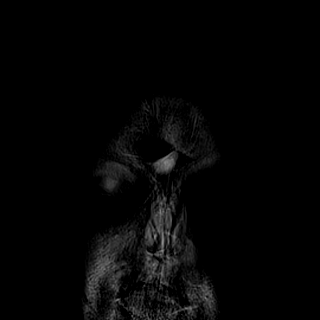
[im 14/28]
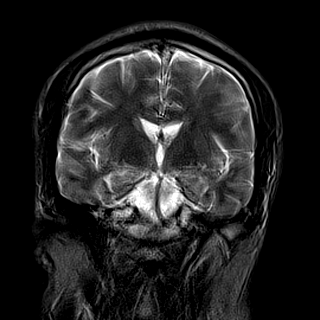
[im 28/28]
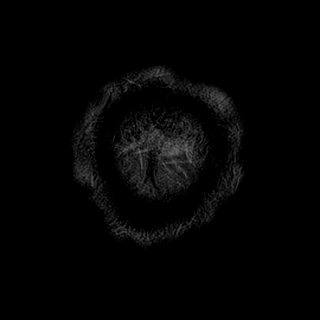

[33 of 48 positions shown; findings below may reference images not displayed]

FINDINGS: MRI HEAD FINDINGS

Brain: Oval 17 millimeter focus of restricted diffusion in the
lateral left thalamus and posterior limb left internal capsule as
seen on series 5, image 49. Associated T2 and FLAIR hyperintensity.
No hemorrhage or mass effect.

There are also 2 smaller, up to 10 millimeters, subcortical white
matter foci of restricted diffusion in the anterior left frontal
lobe (series 3, image 82). Similar T2 and FLAIR hyperintensity with
no hemorrhage or mass effect.

Furthermore, there is a 12 millimeter area of abnormal trace
diffusion in the right superior cerebellum (series 3, image 73)
which appears T2 and FLAIR hyperintense but isointense on ADC
compatible with a small subacute infarct. No hemorrhage or mass
effect.

Multiple chronic small lacunar infarcts bilateral cerebellum and
dorsal right pons are new since 1138. Outside of the acute findings
the deep gray matter nuclei remain normal. Periventricular white
matter T2 and FLAIR hyperintensity has progressed since 1138 outside
of the acute areas, including the right periatrial white matter on
series 13, image 25. No cerebral cortical encephalomalacia
identified. No chronic cerebral blood products identified.

No midline shift, mass effect, evidence of mass lesion,
ventriculomegaly, extra-axial collection or acute intracranial
hemorrhage. Cervicomedullary junction and pituitary are within
normal limits.

Vascular: Major intracranial vascular flow voids are stable since
6664.

Skull and upper cervical spine: Negative visible cervical spine.
Visualized bone marrow signal is within normal limits.

Sinuses/Orbits: Stable orbits soft tissues with postoperative
changes to both globes. Mild paranasal sinus mucosal thickening has
regressed.

Other: Chronic left mastoid effusion is stable to mildly improved.
Trace right mastoid fluid is stable. Grossly normal visible internal
auditory structures. Grossly negative scalp and face soft tissues.

MRA HEAD FINDINGS

Antegrade flow in the posterior circulation with codominant distal
vertebral arteries. No stenosis. Normal left PICA origin. The right
AICA may be dominant. Patent vertebrobasilar junction and basilar
artery without stenosis. Patent SCA and PCA origins. Both posterior
communicating arteries are present. Bilateral PCA branches are
within normal limits.

Antegrade flow in both ICA siphons with atherosclerotic versus
artifactual bilateral cavernous and proximal supraclinoid siphon
irregularity and narrowing (series 103, image 7). Normal ophthalmic
and posterior communicating artery origins. Normal appearance of the
supraclinoid ICAs. Patent carotid termini. Normal MCA and ACA
origins. Anterior communicating artery and visible ACA branches are
normal. The left MCA bifurcates early. The left M1, bifurcation, and
visible left MCA branches are within normal limits. The right MCA
M1, bifurcation, and visible right MCA branches are within normal
limits.
IMPRESSION: 1. Acute lacunar infarct of the Left lateral thalamus/internal
capsule corresponding to the CT finding yesterday. No associated
hemorrhage or mass effect.
2. Additional small acute lacunar infarcts in the anterior left
frontal lobe subcortical white matter (Left MCA territory). Subacute
infarct of the Right superior cerebellum (Right SCA territory). No
hemorrhage or mass effect.
3. Multiple underlying chronic lacunar infarcts in the cerebellum
and the right pons are new since [DATE]. Intracranial MRA remarkable for moderate irregularity and
stenosis of the bilateral cavernous ICAs. This is suspicious for
advanced ICA atherosclerosis in this clinical setting, but might be
exacerbated by artifact from hyperplastic sphenoid paranasal
sinuses.

## 2020-01-15 ENCOUNTER — Ambulatory Visit: Payer: Medicare HMO | Attending: Internal Medicine

## 2020-01-15 DIAGNOSIS — Z23 Encounter for immunization: Secondary | ICD-10-CM

## 2020-01-15 NOTE — Progress Notes (Signed)
   Covid-19 Vaccination Clinic  Name:  Nathaniel [name]    MRN: [id number] DOB: [birth year]  01/15/2020  [name] was observed post Covid-19 immunization for 15 minutes without incident. He was provided with Vaccine Information Sheet and instruction to access the V-Safe system.   [name] was instructed to call 911 with any severe reactions post vaccine: Marland Kitchen Difficulty breathing  . Swelling of face and throat  . A fast heartbeat  . A bad rash all over body  . Dizziness and weakness   Immunizations Administered    Name Date Dose VIS Date Route   Moderna COVID-19 Vaccine 01/15/2020 11:03 AM 0.5 mL 08/2019 Intramuscular   Manufacturer: Moderna   Lot: [id number]   [place]: [phone]

## 2020-01-28 ENCOUNTER — Encounter (INDEPENDENT_AMBULATORY_CARE_PROVIDER_SITE_OTHER): Payer: Self-pay | Admitting: Ophthalmology

## 2020-01-28 ENCOUNTER — Other Ambulatory Visit: Payer: Self-pay

## 2020-01-28 ENCOUNTER — Ambulatory Visit (INDEPENDENT_AMBULATORY_CARE_PROVIDER_SITE_OTHER): Payer: Medicare HMO | Admitting: Ophthalmology

## 2020-01-28 DIAGNOSIS — E103512 Type 1 diabetes mellitus with proliferative diabetic retinopathy with macular edema, left eye: Secondary | ICD-10-CM | POA: Diagnosis not present

## 2020-01-28 DIAGNOSIS — Z961 Presence of intraocular lens: Secondary | ICD-10-CM | POA: Diagnosis not present

## 2020-01-28 DIAGNOSIS — E103552 Type 1 diabetes mellitus with stable proliferative diabetic retinopathy, left eye: Secondary | ICD-10-CM | POA: Diagnosis not present

## 2020-01-28 DIAGNOSIS — E103591 Type 1 diabetes mellitus with proliferative diabetic retinopathy without macular edema, right eye: Secondary | ICD-10-CM | POA: Insufficient documentation

## 2020-01-28 NOTE — Progress Notes (Signed)
01/28/2020     CHIEF COMPLAINT Patient presents for Retina Follow Up   HISTORY OF PRESENT ILLNESS: Nathaniel [name] is a 34 y.o. male who presents to the clinic today for:   HPI    Retina Follow Up    Patient presents with  Diabetic Retinopathy.  In both eyes.  This started 3 months ago.  Severity is mild.  Duration of 3 months.  Since onset it is stable.          Comments    3 Month Diabetic Exam OU  Pt denies noticeable changes to [place] OU since last visit. Pt denies ocular pain, flashes of light, or floaters OU.  LBS: does not recall A1c: does not recall       Last edited by Rockie Neighbours, [place] on 01/28/2020  1:06 PM. (History)      Referring physician: [name], MD [place],  [place] [address]  HISTORICAL INFORMATION:   Selected notes from the MEDICAL RECORD NUMBER    Lab Results  Component Value Date   HGBA1C 11.0 (A) 09/25/2019     CURRENT MEDICATIONS: No current outpatient medications on file. (Ophthalmic Drugs)   No current facility-administered medications for this visit. (Ophthalmic Drugs)   Current Outpatient Medications (Other)  Medication Sig  . acetaminophen (TYLENOL) 325 MG tablet Take 1-2 tablets (325-650 mg total) by mouth every 4 (four) hours as needed for mild pain.  Marland Kitchen amLODipine (NORVASC) 10 MG tablet Take 1 tablet (10 mg total) by mouth daily. (Patient not taking: Reported on 12/17/2019)  . aspirin 325 MG EC tablet TAKE 1 TABLET BY MOUTH EVERY DAY (Patient not taking: Reported on 12/17/2019)  . atorvastatin (LIPITOR) 80 MG tablet Take 1 tablet (80 mg total) by mouth daily at 6 PM. (Patient not taking: Reported on 12/17/2019)  . clopidogrel (PLAVIX) 75 MG tablet Take 75 mg by mouth daily.  Marland Kitchen glucagon 1 MG injection Inject 1 mg into the vein once as needed for up to 1 dose.  . insulin aspart (NOVOLOG) 100 UNIT/ML FlexPen Inject 8 Units into the skin 3 (three) times daily with meals. (Patient taking differently: Inject 10  Units into the skin 3 (three) times daily with meals. )  . Insulin Glargine, 1 Unit Dial, (TOUJEO SOLOSTAR) 300 UNIT/ML SOPN Inject 10 Units into the skin daily.  . Insulin Pen Needle (PEN NEEDLES 3/16") 31G X 5 MM MISC Four times day  . lisinopril (PRINIVIL,ZESTRIL) 40 MG tablet Take 1 tablet (40 mg total) by mouth daily. (Patient not taking: Reported on 12/17/2019)  . ONETOUCH ULTRA test strip USE TO TEST BLOOD SUGAR 4 TIMES A DAY (INSURANCE WILL ONLY PAY FOR HIM TO TEST 3 TIMES A DAY)   No current facility-administered medications for this visit. (Other)      REVIEW OF SYSTEMS:    ALLERGIES Allergies  Allergen Reactions  . Fructose Other (See Comments)    Increase of blood sugar     PAST MEDICAL HISTORY Past Medical History:  Diagnosis Date  . Diabetes mellitus    lantus/novolog  . Hyperlipidemia   . Hypertension   . Marijuana abuse    occaisionally  . Noncompliance with medication regimen   . Stroke ([place])   . Tobacco abuse    5/day   Past Surgical History:  Procedure Laterality Date  . EYE SURGERY      FAMILY HISTORY Family History  Problem Relation Age of Onset  . Diabetes Father   .  Kidney failure Father        last 4 mnths of life  . Hypertension Mother   . Diabetes Maternal Grandmother   . Pancreatic cancer Maternal Grandfather   . Diabetes Paternal Grandfather     SOCIAL HISTORY Social History   Tobacco Use  . Smoking status: Current Every Day Smoker    Packs/day: 0.25    Years: 8.00    Pack years: 2.00    Types: Cigarettes  . Smokeless tobacco: Never Used  . Tobacco comment: smoke 3 to 4 per day  Substance Use Topics  . Alcohol use: No  . Drug use: Yes    Types: Marijuana    Comment: last use yesterday, every other days          OPHTHALMIC EXAM:  Base Eye Exam    Visual Acuity (ETDRS)      Right Left   Dist [place] 20/30 +2 20/30 +2   Dist ph [place] NI 20/25 -2       Tonometry (Tonopen, 1:12 PM)      Right Left   Pressure 16 15         Pupils      Pupils Dark Light Shape React APD   Right PERRL 4 3 Round Slow None   Left PERRL 5 4 Round Slow None       Visual Fields (Counting fingers)      Left Right    Full Full       Extraocular Movement      Right Left    Full Full       Neuro/Psych    Oriented x3: Yes   Mood/Affect: Normal       Dilation    Both eyes: 1.0% Mydriacyl, 2.5% Phenylephrine @ 1:12 PM        Slit Lamp and Fundus Exam    External Exam      Right Left   External Normal Normal       Slit Lamp Exam      Right Left   Lids/Lashes Normal Normal   Conjunctiva/Sclera White and quiet White and quiet   Cornea Clear Clear   Anterior Chamber Deep and quiet Deep and quiet   Iris Round and reactive Round and reactive   Lens Posterior chamber intraocular lens Posterior chamber intraocular lens   Anterior Vitreous Normal Normal       Fundus Exam      Right Left   Posterior Vitreous Normal Clear vitrectomized   Disc Normal Normal   C/D Ratio 0.45 0.45   Macula no macular thickening, Microaneurysms no macular thickening, Microaneurysms   Vessels No diabetic retinopathy, quiescent No diabetic retinopathy, quiescent   Periphery Good PRP,  attached Good PRP,  attached          IMAGING AND PROCEDURES  Imaging and Procedures for 01/28/20  OCT, Retina - OU - Both Eyes       Right Eye Quality was good. Scan locations included subfoveal. Central Foveal Thickness: 226. Progression has been stable. Findings include normal observations.   Left Eye Quality was good. Scan locations included subfoveal. Central Foveal Thickness: 233. Progression has been stable. Findings include normal observations.   Notes No Active clinically significant macular edema in either eye.  Quiescent PDR OU                ASSESSMENT/PLAN:  No problem-specific Assessment & Plan notes found for this encounter.      ICD-10-CM   1.  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 Stable treated proliferative diabetic retinopathy of left eye with macular edema determined by examination associated with type 1 diabetes mellitus ([place])  P10.3159    Y58.5929   3. Pseudophakia  Z96.1     1.  2.  3.  Ophthalmic Meds Ordered this visit:  No orders of the defined types were placed in this encounter.      Return in about 4 months (around 05/30/2020) for DILATE OU, COLOR FP.  There are no Patient Instructions on file for this visit.   Explained the diagnoses, plan, and follow up with the patient and they expressed understanding.  Patient expressed understanding of the importance of proper follow up care.   Clent Demark Lauryl Seyer M.D. Diseases & Surgery of the Retina and Vitreous [facility]ina [facility] Diabetic [place] 01/28/20     Abbreviations: M myopia (nearsighted); A astigmatism; H hyperopia (farsighted); P presbyopia; Mrx spectacle prescription;  CTL contact lenses; OD right eye; OS left eye; OU both eyes  XT exotropia; ET esotropia; PEK punctate epithelial keratitis; PEE punctate epithelial erosions; DES dry eye syndrome; MGD meibomian gland dysfunction; ATs artificial tears; PFAT's preservative free artificial tears; [place] nuclear sclerotic cataract; PSC posterior subcapsular cataract; ERM epi-retinal membrane; PVD posterior vitreous detachment; RD retinal detachment; DM diabetes mellitus; DR diabetic retinopathy; NPDR non-proliferative diabetic retinopathy; PDR proliferative diabetic retinopathy; CSME clinically significant macular edema; DME diabetic macular edema; dbh dot blot hemorrhages; CWS cotton wool spot; POAG primary open angle glaucoma; C/D cup-to-disc ratio; HVF humphrey visual field; GVF goldmann visual field; OCT optical coherence tomography; IOP intraocular pressure; BRVO Branch retinal vein occlusion; CRVO central retinal vein occlusion; CRAO central  retinal artery occlusion; BRAO branch retinal artery occlusion; RT retinal tear; SB scleral buckle; PPV pars plana vitrectomy; VH Vitreous hemorrhage; PRP panretinal laser photocoagulation; IVK intravitreal kenalog; VMT vitreomacular traction; MH Macular hole;  NVD neovascularization of the disc; NVE neovascularization elsewhere; AREDS age related eye disease study; ARMD age related macular degeneration; POAG primary open angle glaucoma; EBMD epithelial/anterior basement membrane dystrophy; ACIOL anterior chamber intraocular lens; IOL intraocular lens; PCIOL posterior chamber intraocular lens; Phaco/IOL phacoemulsification with intraocular lens placement; [place] photorefractive keratectomy; LASIK laser assisted in situ keratomileusis; HTN hypertension; DM diabetes mellitus; COPD chronic obstructive pulmonary disease

## 2020-02-25 ENCOUNTER — Telehealth: Payer: Self-pay | Admitting: Endocrinology

## 2020-02-25 ENCOUNTER — Other Ambulatory Visit: Payer: Self-pay

## 2020-02-25 MED ORDER — TOUJEO SOLOSTAR 300 UNIT/ML ~~LOC~~ SOPN: 10.0000 [IU] | PEN_INJECTOR | Freq: Every day | SUBCUTANEOUS | 1 refills | Status: DC

## 2020-02-25 NOTE — Telephone Encounter (Signed)
Rx sent 

## 2020-02-25 NOTE — Telephone Encounter (Signed)
Medication Refill Request  Did you call your pharmacy and request this refill first? Yes   If patient has not contacted pharmacy first, instruct them to do so for future refills.   Remind them that contacting the pharmacy for their refill is the quickest method to get the refill.   Refill policy also stated that it will take anywhere between 24-72 hours to receive the refill.    Name of medication? toujeo  Is this a 90 day supply? yes  Name and location of pharmacy?    [id number] - [place], [place] Phone:  [phone]  Fax:  [phone]

## 2020-03-02 ENCOUNTER — Other Ambulatory Visit: Payer: Self-pay | Admitting: Endocrinology

## 2020-03-03 ENCOUNTER — Other Ambulatory Visit: Payer: Self-pay | Admitting: Endocrinology

## 2020-03-19 ENCOUNTER — Other Ambulatory Visit: Payer: Self-pay

## 2020-03-19 ENCOUNTER — Encounter: Payer: Self-pay | Admitting: Endocrinology

## 2020-03-19 ENCOUNTER — Ambulatory Visit (INDEPENDENT_AMBULATORY_CARE_PROVIDER_SITE_OTHER): Payer: Medicare HMO | Admitting: Endocrinology

## 2020-03-19 VITALS — BP 118/78 | HR 97 | Ht 74.0 in | Wt 141.6 lb

## 2020-03-19 DIAGNOSIS — E1065 Type 1 diabetes mellitus with hyperglycemia: Secondary | ICD-10-CM | POA: Diagnosis not present

## 2020-03-19 DIAGNOSIS — E7800 Pure hypercholesterolemia, unspecified: Secondary | ICD-10-CM

## 2020-03-19 DIAGNOSIS — E1021 Type 1 diabetes mellitus with diabetic nephropathy: Secondary | ICD-10-CM

## 2020-03-19 DIAGNOSIS — E041 Nontoxic single thyroid nodule: Secondary | ICD-10-CM | POA: Diagnosis not present

## 2020-03-19 NOTE — Patient Instructions (Signed)
If the blood sugar is over 200 take NovoLog in addition to the above dose or otherwise to bring the sugar down as follows   Blood sugar 200-250: Take 2 units 250-300: Take 3 units Over 300: Take 4 units  Check blood sugars on waking up 7 days a week  Also check blood sugars about 2 hours after meals and do this after different meals by rotation  Recommended blood sugar levels on waking up are 90-130 and about 2 hours after meal is 130-160  Please bring your blood sugar monitor to each visit, thank you  Insulin as on other papers

## 2020-03-19 NOTE — Progress Notes (Signed)
Patient ID: [name], male   DOB: 1986-03-31, 34 y.o.   MRN: [id number]           Reason for Appointment : Follow-up for Type 1 Diabetes  History of Present Illness           Diagnosis: Type 1 diabetes mellitus, date of diagnosis:  ?  2004        Previous history:  He has had recurrent hospitalizations for ketoacidosis especially in 2017 and 2018, last episode was in 01/2018 Overall has had consistently poor control with A1c as high as 16 In the past he has required large doses of at least basal insulin, taking up to 200 units of Lantus daily at some point  Recent history:     INSULIN regimen is: Novolog as below Toujeo 10 units daily in pm  His A1c is about the same at 10.3 compared to 11  Current management, blood sugar patterns and problems identified:    Glucose monitoring:  He is mostly checking blood sugars in the evening hours consistently when he is having his first meal of the day, also not checking every day  Also not checking blood sugars if he is reportedly symptomatic with low blood sugars  No readings done after meals and only about once a day at the most  His blood sugars are consistently over 200 and averaging over 300  Only had 1 low blood sugar of 59 around 6 PM and not clear why  Basal insulin regimen:  Is now taking only 3 units of Tresiba  Reportedly Toujeo was not covered by his insurance  Even though his instructions for Nathaniel [name] are 10 units he is only taking 3 units and not every day also  He thinks he cannot take his insulin every day because of fear of low blood sugars but has had only one documented low sugar recently  He takes basal insulin around 6 PM in the evening or whenever he wakes up in the early evening  Mealtime coverage/diet:  He again says that he does not take NovoLog insulin because he is not eating  Again he is mostly eating snacks but this can be carbohydrate containing  Likely is having most of his snacks late  at night and overnight  Also today had soup with beans and corn and did not take any coverage  Previously was given specific instructions on taking mealtime coverage and also correction doses   Glucose monitoring:  is being done mostly 1 times a day         Glucometer:  One Touch ultra 2      Blood Glucose readings as below  AVERAGE 382 with blood sugar range 59-500+, mostly between 5 PM-9 PM Blood sugar over 600 twice on the night of 6/25   PREVIOUS CGM: Dates of Recording: 1/6 through 1/19  Sensor description: [employer] professional  Results statistics:   CGM use % of time  100  Average and SD  283, GV 58  Time in range    18    %  % Time Above 180  11  % Time above 250  56  % Time Below target  15    Glycemic patterns summary: Blood sugars are showing extreme variability at all times HIGHEST blood sugars are late at night rising after about 9 PM and not starting to come down to at least 7 AM He has frequent hypoglycemia on about half of the days studied with more frequent  hypoglycemia between 1 PM and 6 PM Average blood sugar on any given day varies between 130 and 431  Hypoglycemia:  As above Symptoms of hypoglycemia: Feeling tired, sweaty, slurred speech, weakness Treatment of hypoglycemia: Juice usually, sometimes glucose tablets, usually 2 at a time     Self-care: The diet that the patient has been following is: None  Mealtimes are: Very variable              Dietician consultation: Most recent: 06/2018     CDE consultation: Years ago  Diabetes labs:  Lab Results  Component Value Date   HGBA1C 11.0 (A) 09/25/2019   HGBA1C 9.3 (A) 06/11/2019   HGBA1C 8.0 (A) 03/05/2019   Lab Results  Component Value Date   LDLCALC 86 03/05/2019   CREATININE 2.82 (H) 03/05/2019    No results found for: MICRALBCREAT  Wt Readings from Last 3 Encounters:  03/19/20 141 lb 9.6 oz (64.2 kg)  12/17/19 151 lb 12.8 oz (68.9 kg)  10/16/19 144 lb (65.3 kg)      Allergies as of 03/19/2020      Reactions   Fructose Other (See Comments)   Increase of blood sugar      Medication List       Accurate as of March 19, 2020 11:59 PM. If you have any questions, ask your nurse or doctor.        acetaminophen 325 MG tablet Commonly known as: TYLENOL Take 1-2 tablets (325-650 mg total) by mouth every 4 (four) hours as needed for mild pain.   amLODipine 10 MG tablet Commonly known as: NORVASC Take 1 tablet (10 mg total) by mouth daily.   aspirin 325 MG EC tablet TAKE 1 TABLET BY MOUTH EVERY DAY   atorvastatin 80 MG tablet Commonly known as: LIPITOR Take 1 tablet (80 mg total) by mouth daily at 6 PM.   clopidogrel 75 MG tablet Commonly known as: PLAVIX Take 75 mg by mouth daily.   glucagon 1 MG injection Inject 1 mg into the vein once as needed for up to 1 dose.   insulin aspart 100 UNIT/ML FlexPen Commonly known as: NOVOLOG Inject 8 Units into the skin 3 (three) times daily with meals. What changed: how much to take   lisinopril 40 MG tablet Commonly known as: ZESTRIL Take 1 tablet (40 mg total) by mouth daily.   OneTouch Ultra test strip Generic drug: glucose blood USE TO TEST BLOOD SUGAR 4 TIMES A DAY (INSURANCE WILL ONLY PAY FOR HIM TO TEST 3 TIMES A DAY)   Pen Needles 3/16" 31G X 5 MM Misc Four times day   Toujeo SoloStar 300 UNIT/ML Solostar Pen Generic drug: insulin glargine (1 Unit Dial) Inject 10 Units into the skin daily.   Nathaniel [name] FlexTouch 100 UNIT/ML FlexTouch Pen Generic drug: insulin degludec Inject 10 units under the skin once daily. What changed:   how much to take  additional instructions       Allergies:  Allergies  Allergen Reactions   Fructose Other (See Comments)    Increase of blood sugar     Past Medical History:  Diagnosis Date   Diabetes mellitus    lantus/novolog   Hyperlipidemia    Hypertension    Marijuana abuse    occaisionally   Noncompliance with medication  regimen    Stroke ([place])    Tobacco abuse    5/day    Past Surgical History:  Procedure Laterality Date   EYE SURGERY  Family History  Problem Relation Age of Onset   Diabetes Father    Kidney failure Father        last 47 mnths of life   Hypertension Mother    Diabetes Maternal Grandmother    Pancreatic cancer Maternal Grandfather    Diabetes Paternal Grandfather     Social History:  reports that he has been smoking cigarettes. He has a 2.00 pack-year smoking history. He has never used smokeless tobacco. He reports current drug use. Drug: Marijuana. He reports that he does not drink alcohol.      Review of Systems      Lipids: After his CVA in June 2019 he was started on Lipitor 80 mg, last LDL as follows    Lab Results  Component Value Date   CHOL 142 03/05/2019   HDL 36.90 (L) 03/05/2019   LDLCALC 86 03/05/2019   TRIG 97.0 03/05/2019   CHOLHDL 4 03/05/2019   HYPERTENSION: Has been on treatment for several years from nephrologist  Blood pressure medications including lisinopril 40 mg, and amlodipine are being prescribed by his PCP He did not take his blood pressure medications for 2 days and blood pressure is high  BP Readings from Last 3 Encounters:  03/19/20 118/78  12/17/19 (!) 200/116  10/16/19 122/70    Has chronic kidney disease, seen by nephrologist Last creatinine 2.2 on 10/20  Lab Results  Component Value Date   CREATININE 2.82 (H) 03/05/2019   CREATININE 1.64 (H) 07/07/2018   CREATININE 1.99 (H) 03/08/2018   He has had a thyroid nodule on the right side but did not go for his ultrasound  Lab Results  Component Value Date   TSH 0.97 03/06/2019      LABS:  No visits with results within 1 Week(s) from this visit.  Latest known visit with results is:  Office Visit on 09/25/2019  Component Date Value Ref Range Status   Hemoglobin A1C 09/25/2019 11.0* 4.0 - 5.6 % Final    Physical Examination:  BP 118/78 (BP Location:  Left Arm, Patient Position: Sitting, Cuff Size: Normal)    Pulse 97    Ht 6\' 2"  (1.88 m)    Wt 141 lb 9.6 oz (64.2 kg)    SpO2 98%    BMI 18.18 kg/m     ASSESSMENT:  Diabetes type 1, persistently poorly controlled  A1c is now 10.3 compared to 11% and consistently high   Problems identified:  He is not following instructions for his insulin or monitoring despite repeated detailed instructions  Also is not always cooperating with his mother to make sure he is taking his insulin and checking his sugars as directed  Although he is afraid of hypoglycemia he still does not check his sugars as directed 4 times a day and not even when he is reportedly symptomatic with low sugars  Although he is still requiring very small amounts of insulin his blood sugars are averaging well over 300 fasting  Previously would tend to have high postprandial readings when he would eat his meals or snacks but he does not cover these  He thinks that he is not eating at all but will sometimes still eat carbohydrate containing foods or snacks such as today before he came  Also sometimes will eat a frozen meal without coverage  Not clear why he thinks his insulin prescription for Nathaniel [name] was for 3 units only and this is inadequate   HYPERTENSION: Better controlled now and needs to follow-up with his  nephrologist  History of hypercholesterolemia: Needs follow-up labs today  PLAN:   Start taking 6 units of Tresiba as a starting dose and needs to take this daily Explained to him and his mother how to adjust this by 1 unit every 3 days until morning sugars are below 130 If his blood sugars are below 100 he will reduce the dose by 1 unit  Discussed in detail the use of mealtime coverage and even though he is not eating a full meal he still needs at least 2 to 4 units to cover his large snacks or frozen meals Also needs correction doses for blood sugars over 200 using 2-4 units extra Emphasized the need to check  blood sugars before eating and also usually about 2 hours later He will also be scheduled for diabetes education on the same day since he does not make an appointment to come separately otherwise  He will not qualify for CGM unless he is checking blood sugar 4 times a day and taking NovoLog at least twice a day   Patient Instructions  If the blood sugar is over 200 take NovoLog in addition to the above dose or otherwise to bring the sugar down as follows   Blood sugar 200-250: Take 2 units 250-300: Take 3 units Over 300: Take 4 units  Check blood sugars on waking up 7 days a week  Also check blood sugars about 2 hours after meals and do this after different meals by rotation  Recommended blood sugar levels on waking up are 90-130 and about 2 hours after meal is 130-160  Please bring your blood sugar monitor to each visit, thank you  Insulin as on other papers       Elayne Snare 03/20/2020, 9:11 AM      Note: This note was prepared with Dragon voice recognition system technology. Any transcriptional errors that result from this process are unintentional.

## 2020-03-20 ENCOUNTER — Other Ambulatory Visit: Payer: Self-pay

## 2020-03-20 LAB — LIPID PANEL
Cholesterol: 198 mg/dL (ref 0–200)
HDL: 38.2 mg/dL — ABNORMAL LOW (ref >39.00)
LDL Cholesterol: 128 mg/dL — ABNORMAL HIGH (ref 0–99)
NonHDL: 160.12
Total CHOL/HDL Ratio: 5
Triglycerides: 162 mg/dL — ABNORMAL HIGH (ref 0.0–149.0)
VLDL: 32.4 mg/dL (ref 0.0–40.0)

## 2020-03-20 LAB — COMPREHENSIVE METABOLIC PANEL
ALT: 9 U/L (ref 0–53)
AST: 12 U/L (ref 0–37)
Albumin: 3.7 g/dL (ref 3.5–5.2)
Alkaline Phosphatase: 92 U/L (ref 39–117)
BUN: 34 mg/dL — ABNORMAL HIGH (ref 6–23)
CO2: 26 mEq/L (ref 19–32)
Calcium: 9.1 mg/dL (ref 8.4–10.5)
Chloride: 99 mEq/L (ref 96–112)
Creatinine, Ser: 2.7 mg/dL — ABNORMAL HIGH (ref 0.40–1.50)
GFR: 32.98 mL/min — ABNORMAL LOW (ref >60.00)
Glucose, Bld: 494 mg/dL — ABNORMAL HIGH (ref 70–99)
Potassium: 4.7 mEq/L (ref 3.5–5.1)
Sodium: 134 mEq/L — ABNORMAL LOW (ref 135–145)
Total Bilirubin: 0.4 mg/dL (ref 0.2–1.2)
Total Protein: 7.3 g/dL (ref 6.0–8.3)

## 2020-03-20 LAB — POCT GLYCOSYLATED HEMOGLOBIN (HGB A1C): Hemoglobin A1C: 10.3 % — AB (ref 4.0–5.6)

## 2020-03-20 MED ORDER — TRESIBA FLEXTOUCH 100 UNIT/ML ~~LOC~~ SOPN: PEN_INJECTOR | SUBCUTANEOUS | 1 refills | Status: DC

## 2020-03-20 MED ORDER — INSULIN ASPART 100 UNIT/ML FLEXPEN: PEN_INJECTOR | SUBCUTANEOUS | 1 refills | Status: DC

## 2020-03-20 NOTE — Progress Notes (Signed)
Cholesterol is high, his mother needs to make sure he starts taking his atorvastatin daily which he is not likely doing.  Send copy of labs to PCP and kidney doctor

## 2020-04-30 ENCOUNTER — Encounter: Payer: Self-pay | Admitting: Endocrinology

## 2020-04-30 ENCOUNTER — Encounter: Payer: Medicare HMO | Attending: Endocrinology | Admitting: Nutrition

## 2020-04-30 ENCOUNTER — Other Ambulatory Visit: Payer: Self-pay

## 2020-04-30 ENCOUNTER — Other Ambulatory Visit: Payer: Self-pay | Admitting: *Deleted

## 2020-04-30 ENCOUNTER — Ambulatory Visit (INDEPENDENT_AMBULATORY_CARE_PROVIDER_SITE_OTHER): Payer: Medicare HMO | Admitting: Endocrinology

## 2020-04-30 VITALS — BP 120/72 | HR 99 | Ht 74.0 in | Wt 141.8 lb

## 2020-04-30 DIAGNOSIS — E1065 Type 1 diabetes mellitus with hyperglycemia: Secondary | ICD-10-CM

## 2020-04-30 MED ORDER — TRESIBA FLEXTOUCH 100 UNIT/ML ~~LOC~~ SOPN: PEN_INJECTOR | SUBCUTANEOUS | 1 refills | Status: DC

## 2020-04-30 MED ORDER — FREESTYLE LIBRE 2 READER DEVI: 1.0000 | Freq: Once | 0 refills | Status: AC

## 2020-04-30 MED ORDER — FREESTYLE LIBRE 2 SENSOR MISC: 2.0000 | 3 refills | Status: DC

## 2020-04-30 NOTE — Progress Notes (Signed)
Patient ID: Nathaniel [name], male   DOB: May 25, 1986, 34 y.o.   MRN: [id number]           Reason for Appointment : Follow-up for Type 1 Diabetes  History of Present Illness           Diagnosis: Type 1 diabetes mellitus, date of diagnosis:  ?  2004        Previous history:  He has had recurrent hospitalizations for ketoacidosis especially in 2017 and 2018, last episode was in 01/2018 Overall has had consistently poor control with A1c as high as 16 In the past he has required large doses of at least basal insulin, taking up to 200 units of Lantus daily at some point  Recent history:     INSULIN regimen is: Novolog mostly 4 units before meals  Tresiba 7 units daily  His A1c is recently 10.3 compared to 11  Current management, blood sugar patterns and problems identified:    Glucose monitoring:  He is still not motivated to check blood sugars more often and only doing it usually when he wakes up sometime in the afternoon or when symptomatic, usually only once per day despite reminders to check more often and after meals  Blood sugars fluctuate considerably as before  In the last few days he has had only 1 high reading of 322 midday otherwise near normal  No nocturnal hypoglycemia  Only had 1 low blood sugar of 53 around 7 PM and he does not know why it was low  Basal insulin regimen:  Is now taking 7 units of Tresiba, previously 3 units  Not clear if he is taking this regularly since he has some readings over 600  Has not followed a flowsheet given for dosage adjustment  He takes basal insulin around 6 PM in the evening or whenever he wakes up in the early evening  Mealtime coverage/diet:  He is trying to take 4 units of NovoLog when he has a meal which is usually once a day  Not counting carbohydrates or adjusting mealtime dose based on what he is eating  Otherwise usually having snacks at various times without any coverage   Glucose monitoring:  is being done  mostly 1 times a day         Glucometer:  One Touch ultra 2       Blood Glucose readings as below  AVERAGE 350 with blood sugar range 53-600+   PREVIOUS CGM: Dates of Recording: 1/6 through 1/19  Sensor description: [employer] professional   CGM use % of time  100  Average and SD  283, GV 58  Time in range    18    %  % Time Above 180  11  % Time above 250  56  % Time Below target  15    Glycemic patterns summary: Blood sugars are showing extreme variability at all times HIGHEST blood sugars are late at night rising after about 9 PM and not starting to come down to at least 7 AM He has frequent hypoglycemia on about half of the days studied with more frequent hypoglycemia between 1 PM and 6 PM Average blood sugar on any given day varies between 130 and 431  Hypoglycemia:  As above Symptoms of hypoglycemia: Feeling tired, sweaty, slurred speech, weakness Treatment of hypoglycemia: Juice usually, sometimes glucose tablets, usually 2 at a time     Self-care: The diet that the patient has been following is: None  Mealtimes  are: Very variable              Dietician consultation: Most recent: 06/2018     CDE consultation: Years ago  Diabetes labs:  Lab Results  Component Value Date   HGBA1C 10.3 (A) 03/20/2020   HGBA1C 11.0 (A) 09/25/2019   HGBA1C 9.3 (A) 06/11/2019   Lab Results  Component Value Date   LDLCALC 128 (H) 03/19/2020   CREATININE 2.70 (H) 03/19/2020    No results found for: MICRALBCREAT  Wt Readings from Last 3 Encounters:  04/30/20 141 lb 12.8 oz (64.3 kg)  03/19/20 141 lb 9.6 oz (64.2 kg)  12/17/19 151 lb 12.8 oz (68.9 kg)     Allergies as of 04/30/2020      Reactions   Fructose Other (See Comments)   Increase of blood sugar      Medication List       Accurate as of April 30, 2020  4:03 PM. If you have any questions, ask your nurse or doctor.        acetaminophen 325 MG tablet Commonly known as: TYLENOL Take 1-2 tablets (325-650  mg total) by mouth every 4 (four) hours as needed for mild pain.   amLODipine 10 MG tablet Commonly known as: NORVASC Take 1 tablet (10 mg total) by mouth daily.   aspirin 325 MG EC tablet TAKE 1 TABLET BY MOUTH EVERY DAY   atorvastatin 80 MG tablet Commonly known as: LIPITOR Take 1 tablet (80 mg total) by mouth daily at 6 PM.   clopidogrel 75 MG tablet Commonly known as: PLAVIX Take 75 mg by mouth daily.   glucagon 1 MG injection Inject 1 mg into the vein once as needed for up to 1 dose.   insulin aspart 100 UNIT/ML FlexPen Commonly known as: NOVOLOG Inject per sliding scale before mealsI: Blood sugars between 200-250 Take 2 units, 250-300 take 3 units, Over 300 Take 4 units   lisinopril 40 MG tablet Commonly known as: ZESTRIL Take 1 tablet (40 mg total) by mouth daily.   OneTouch Ultra test strip Generic drug: glucose blood USE TO TEST BLOOD SUGAR 4 TIMES A DAY (INSURANCE WILL ONLY PAY FOR HIM TO TEST 3 TIMES A DAY)   Pen Needles 3/16" 31G X 5 MM Misc Four times day   Tresiba FlexTouch 100 UNIT/ML FlexTouch Pen Generic drug: insulin degludec Inject 6 units under the skin once daily. Increase by 1 unit every 3 days until am blood sugar is below 130mg /dL.       Allergies:  Allergies  Allergen Reactions  . Fructose Other (See Comments)    Increase of blood sugar     Past Medical History:  Diagnosis Date  . Diabetes mellitus    lantus/novolog  . Hyperlipidemia   . Hypertension   . Marijuana abuse    occaisionally  . Noncompliance with medication regimen   . Stroke ([place])   . Tobacco abuse    5/day    Past Surgical History:  Procedure Laterality Date  . EYE SURGERY      Family History  Problem Relation Age of Onset  . Diabetes Father   . Kidney failure Father        last 4 mnths of life  . Hypertension Mother   . Diabetes Maternal Grandmother   . Pancreatic cancer Maternal Grandfather   . Diabetes Paternal Grandfather     Social History:   reports that he has been smoking cigarettes. He has a 2.00 pack-year smoking  history. He has never used smokeless tobacco. He reports current drug use. Drug: Marijuana. He reports that he does not drink alcohol.      Review of Systems      Lipids: After his CVA in June 2019 he was started on Lipitor 80 mg, last LDL as follows  Apparently was not taking his atorvastatin when he had his labs in 6/21 and not clear if he is taking this regularly now  Lab Results  Component Value Date   CHOL 198 03/19/2020   HDL 38.20 (L) 03/19/2020   LDLCALC 128 (H) 03/19/2020   TRIG 162.0 (H) 03/19/2020   CHOLHDL 5 03/19/2020   HYPERTENSION: Has been on treatment for several years from nephrologist  Blood pressure medications including lisinopril 40 mg, and amlodipine are being prescribed by his PCP   BP Readings from Last 3 Encounters:  04/30/20 120/72  03/19/20 118/78  12/17/19 (!) 200/116    Has chronic kidney disease, seen by nephrologist regularly  Lab Results  Component Value Date   CREATININE 2.70 (H) 03/19/2020   CREATININE 2.82 (H) 03/05/2019   CREATININE 1.64 (H) 07/07/2018   He has had a thyroid nodule on the right side but did not go for his ultrasound again  Lab Results  Component Value Date   TSH 0.97 03/06/2019      LABS:  No visits with results within 1 Week(s) from this visit.  Latest known visit with results is:  Office Visit on 03/19/2020  Component Date Value Ref Range Status  . Cholesterol 03/19/2020 198  0 - 200 mg/dL Final   ATP III Classification       Desirable:  < 200 mg/dL               Borderline High:  200 - 239 mg/dL          High:  > = 240 mg/dL  . Triglycerides 03/19/2020 162.0* 0 - 149 mg/dL Final   Normal:  <150 mg/dLBorderline High:  150 - 199 mg/dL  . HDL 03/19/2020 38.20* >39.00 mg/dL Final  . VLDL 03/19/2020 32.4  0.0 - 40.0 mg/dL Final  . LDL Cholesterol 03/19/2020 128* 0 - 99 mg/dL Final  . Total CHOL/HDL Ratio 03/19/2020 5   Final                   Men          Women1/2 Average Risk     3.4          3.3Average Risk          5.0          4.42X Average Risk          9.6          7.13X Average Risk          15.0          11.0                      . NonHDL 03/19/2020 160.12   Final   NOTE:  Non-HDL goal should be 30 mg/dL higher than patient's LDL goal (i.e. LDL goal of < 70 mg/dL, would have non-HDL goal of < 100 mg/dL)  . Sodium 03/19/2020 134* 135 - 145 mEq/L Final  . Potassium 03/19/2020 4.7  3.5 - 5.1 mEq/L Final  . Chloride 03/19/2020 99  96 - 112 mEq/L Final  . CO2 03/19/2020 26  19 - 32 mEq/L Final  .  Glucose, Bld 03/19/2020 494* 70 - 99 mg/dL Final  . BUN 03/19/2020 34* 6 - 23 mg/dL Final  . Creatinine, Ser 03/19/2020 2.70* 0.40 - 1.50 mg/dL Final  . Total Bilirubin 03/19/2020 0.4  0.2 - 1.2 mg/dL Final  . Alkaline Phosphatase 03/19/2020 92  39 - 117 U/L Final  . AST 03/19/2020 12  0 - 37 U/L Final  . ALT 03/19/2020 9  0 - 53 U/L Final  . Total Protein 03/19/2020 7.3  6.0 - 8.3 g/dL Final  . Albumin 03/19/2020 3.7  3.5 - 5.2 g/dL Final  . GFR 03/19/2020 32.98* >60.00 mL/min Final  . Calcium 03/19/2020 9.1  8.4 - 10.5 mg/dL Final  . Hemoglobin A1C 03/20/2020 10.3* 4.0 - 5.6 % Final    Physical Examination:  BP 120/72 (BP Location: Left Arm, Patient Position: Sitting, Cuff Size: Normal)   Pulse 99   Ht 6\' 2"  (1.88 m)   Wt 141 lb 12.8 oz (64.3 kg)   SpO2 99%   BMI 18.21 kg/m     ASSESSMENT:  Diabetes type 1, persistently poorly controlled  A1c is last 10.3 compared to 11% and consistently high  Still has difficulties with inadequate monitoring, likely inconsistent insulin use and inherent variability of blood sugars Blood sugars are averaging 350 but only checked likely once a day when he wakes up at various times Since blood sugars are relatively better the last 3 to 4 days he may be taking [name] regularly now and needs to focus on mealtime coverage  HYPERTENSION: Controlled  History of  hypercholesterolemia: Needs follow-up labs on the next visit, reminded him to take atorvastatin daily  PLAN:   No change in insulin However he needs to check blood sugars before and 2 hours after every meal to help adjust his mealtime dose This will also enable establishing a carbohydrate ratio  He will now qualify for CGM since he is taking insulin up to 3 times a day and Medicare requirements are changed Discussed in detail how CGM works, use of the [employer], application process and monitoring as well as given him patient information on this.  However we will need to have been instructed by the nurse educator when he picks up the prescription Also given information on DME suppliers if needed   There are no Patient Instructions on file for this visit.    Elayne Snare 04/30/2020, 4:03 PM      Note: This note was prepared with Dragon voice recognition system technology. Any transcriptional errors that result from this process are unintentional.

## 2020-04-30 NOTE — Patient Instructions (Signed)
Check blood sugars on waking up 6  days a week  Also check blood sugars before and about 2 hours after meals and do this after different meals by rotation  Recommended blood sugar levels on waking up are 90-130 and about 2 hours after meal is 130-160  Please bring your blood sugar monitor to each visit, thank you

## 2020-05-01 ENCOUNTER — Encounter: Payer: Self-pay | Admitting: Endocrinology

## 2020-05-01 NOTE — Patient Instructions (Addendum)
1. Take [name] every evening.  Look over last 3 days of morning readings.  If over 140, increase dose of Tesiba by 1u and wait 3 more days.  If still high, take one more unit.  Continue this until all FBSs are below 140.  2.  Also add a correction dose of Novolog to the meal time insulin dose to bring down blood sugar readings.  2u if blood sugar is between 200-250,  3u if between 250-300, and 4 extra units of over 300.

## 2020-05-01 NOTE — Progress Notes (Addendum)
Patient was identified by name and DOB.  He is here with his mother to review blood sugars, diet and insulins adjustments. Insulin:  Tesiba 6u QD at 6PM  Has not adjusted dose even thought his FBS can go over 300 some days.  Says FBS today was 136.   Novolog 4-6u  Not adjusting dose for current blood sugar level. SBGM:  Only tests FBSs.  Did not bring meter.  Stressed need to go home and get meter before Dr. Ronnie Derby appointment today Suggested testing at [facility] and to correct for high readings to bring down FBS readings.  Mother says it is a challenge to get him to test once a day.  Pt. Said nothing.  Mother says readings are "all over the place-as high as HI on meter.  Diet:  Up 12PM:  1-2PM: 3 eggs, 2 pieces of sausage, 1 6 oz bottle of Sunny dielight (14 grams of carb)  Takes 4u for this, "due to small amount of carbs eaten-small meal " 8PM frozen [place] with water or [employer] Will take 5u for this' Denies drinking anything with calories in it all day or snacking on anything between 2PM and 8PM.  Mother shakes her head in agreement of this. 1AM will occasionall have snack of handfull of tortilla chips due to brother having a food truck that has large bag of this. 4AM:  Sleep. Denies that he is drinking sweet drinks and eating snacks during the evening.  Discussion: 1.  Need for correction dose to bring down high blood sugars.  Explained that Novolog meal dose will not bring down high blood sugars, and that this dose will only cover blood sugar rise after meal eaten.  He reported good understanding of this.   Written instructions given to him for correction dose of Dr. Ronnie Derby at last visit.  Did 3 examples of meal sizes and current blood sugar readings and he calculated the correct insulin dose for the suggested meal. 2.  Stressed importance of getting fasting blood sugar down for kidney preservation.  Discussed how [name] works, and that it will not bring down readings that are high when he  goes to bed.  Discussed need for correction dose/or does of 1u of Novolog if eating something else with carbohydrate during early morning hours.  He reported good understanding of this. 3.  Reviewed again how to determine if he needs to adjust Tresiba dose, and reviewed again how to do this with written instructions for this.

## 2020-06-02 ENCOUNTER — Encounter (INDEPENDENT_AMBULATORY_CARE_PROVIDER_SITE_OTHER): Payer: Medicare HMO | Admitting: Ophthalmology

## 2020-06-04 ENCOUNTER — Ambulatory Visit (INDEPENDENT_AMBULATORY_CARE_PROVIDER_SITE_OTHER): Payer: Medicare HMO | Admitting: Ophthalmology

## 2020-06-04 ENCOUNTER — Encounter (INDEPENDENT_AMBULATORY_CARE_PROVIDER_SITE_OTHER): Payer: Self-pay | Admitting: Ophthalmology

## 2020-06-04 ENCOUNTER — Other Ambulatory Visit: Payer: Self-pay

## 2020-06-04 DIAGNOSIS — E103512 Type 1 diabetes mellitus with proliferative diabetic retinopathy with macular edema, left eye: Secondary | ICD-10-CM | POA: Diagnosis not present

## 2020-06-04 DIAGNOSIS — E103591 Type 1 diabetes mellitus with proliferative diabetic retinopathy without macular edema, right eye: Secondary | ICD-10-CM

## 2020-06-04 NOTE — Patient Instructions (Signed)
Patient asked to report promptly to the office should new visual acuity symptoms develop of flashes floaters curtain of darkness or change in quality of vision

## 2020-06-04 NOTE — Progress Notes (Signed)
06/04/2020     CHIEF COMPLAINT Patient presents for Retina Follow Up   HISTORY OF PRESENT ILLNESS: Nathaniel [name] is a 34 y.o. male who presents to the clinic today for:   HPI    Retina Follow Up    Patient presents with  Diabetic Retinopathy.  In both eyes.  Severity is moderate.  Duration of 4 months.  Since onset it is stable.  I, the attending physician,  performed the HPI with the patient and updated documentation appropriately.          Comments    4 Month Diabetic Exam OU. FP  Pt states no changes in vision. Denies any complaints.       Last edited by Tilda Franco on 06/04/2020  1:37 PM. (History)      Referring physician: [name], MD [address],  [place] [id number]  HISTORICAL INFORMATION:   Selected notes from the MEDICAL RECORD NUMBER    Lab Results  Component Value Date   HGBA1C 10.3 (A) 03/20/2020     CURRENT MEDICATIONS: No current outpatient medications on file. (Ophthalmic Drugs)   No current facility-administered medications for this visit. (Ophthalmic Drugs)   Current Outpatient Medications (Other)  Medication Sig  . acetaminophen (TYLENOL) 325 MG tablet Take 1-2 tablets (325-650 mg total) by mouth every 4 (four) hours as needed for mild pain.  Marland Kitchen amLODipine (NORVASC) 10 MG tablet Take 1 tablet (10 mg total) by mouth daily.  Marland Kitchen aspirin 325 MG EC tablet TAKE 1 TABLET BY MOUTH EVERY DAY  . atorvastatin (LIPITOR) 80 MG tablet Take 1 tablet (80 mg total) by mouth daily at 6 PM.  . clopidogrel (PLAVIX) 75 MG tablet Take 75 mg by mouth daily.  . Continuous Blood Gluc Sensor (FREESTYLE LIBRE 2 SENSOR) MISC 2 Devices by Does not apply route every 14 (fourteen) days.  Marland Kitchen glucagon 1 MG injection Inject 1 mg into the vein once as needed for up to 1 dose.  . insulin aspart (NOVOLOG) 100 UNIT/ML FlexPen Inject per sliding scale before mealsI: Blood sugars between 200-250 Take 2 units, 250-300 take 3 units, Over 300 Take 4 units    . insulin degludec (TRESIBA FLEXTOUCH) 100 UNIT/ML FlexTouch Pen Patient injecting 7 units daily.  . Insulin Pen Needle (PEN NEEDLES 3/16") 31G X 5 MM MISC Four times day  . lisinopril (PRINIVIL,ZESTRIL) 40 MG tablet Take 1 tablet (40 mg total) by mouth daily.  Glory Rosebush ULTRA test strip USE TO TEST BLOOD SUGAR 4 TIMES A DAY (INSURANCE WILL ONLY PAY FOR HIM TO TEST 3 TIMES A DAY)   No current facility-administered medications for this visit. (Other)      REVIEW OF SYSTEMS:    ALLERGIES Allergies  Allergen Reactions  . Fructose Other (See Comments)    Increase of blood sugar     PAST MEDICAL HISTORY Past Medical History:  Diagnosis Date  . Diabetes mellitus    lantus/novolog  . Hyperlipidemia   . Hypertension   . Marijuana abuse    occaisionally  . Noncompliance with medication regimen   . Stroke ([place])   . Tobacco abuse    5/day   Past Surgical History:  Procedure Laterality Date  . EYE SURGERY      FAMILY HISTORY Family History  Problem Relation Age of Onset  . Diabetes Father   . Kidney failure Father        last 4 mnths of life  . Hypertension Mother   .  Diabetes Maternal Grandmother   . Pancreatic cancer Maternal Grandfather   . Diabetes Paternal Grandfather     SOCIAL HISTORY Social History   Tobacco Use  . Smoking status: Current Every Day Smoker    Packs/day: 0.25    Years: 8.00    Pack years: 2.00    Types: Cigarettes  . Smokeless tobacco: Never Used  . Tobacco comment: smoke 3 to 4 per day  Vaping Use  . Vaping Use: Never used  Substance Use Topics  . Alcohol use: No  . Drug use: Yes    Types: Marijuana    Comment: last use yesterday, every other days          OPHTHALMIC EXAM: Base Eye Exam    Visual Acuity (Snellen - Linear)      Right Left   Dist [place] 20/25 -1 20/25 +       Tonometry (Tonopen, 1:42 PM)      Right Left   Pressure 17 16       Pupils      Pupils Dark Light Shape React APD   Right PERRL 3 3 Round Minimal  None   Left PERRL 3 3 Round Minimal None       Visual Fields (Counting fingers)      Left Right    Full Full       Neuro/Psych    Oriented x3: Yes   Mood/Affect: Normal       Dilation    Both eyes: 1.0% Mydriacyl, 2.5% Phenylephrine @ 1:42 PM        Slit Lamp and Fundus Exam    External Exam      Right Left   External Normal Normal       Slit Lamp Exam      Right Left   Lids/Lashes Normal Normal   Conjunctiva/Sclera White and quiet White and quiet   Cornea Clear Clear   Anterior Chamber Deep and quiet Deep and quiet   Iris Round and reactive Round and reactive   Lens Posterior chamber intraocular lens Posterior chamber intraocular lens   Anterior Vitreous Normal Normal       Fundus Exam      Right Left   Posterior Vitreous Normal Clear vitrectomized   Disc Normal Normal   C/D Ratio 0.45 0.45   Macula no macular thickening, Microaneurysms no macular thickening, Microaneurysms   Vessels PDR-quiet PDR-quiet   Periphery Good PRP,  attached, room posteriorly for PRP Good PRP,  attached          IMAGING AND PROCEDURES  Imaging and Procedures for 06/04/20  Color Fundus Photography Optos - OU - Both Eyes       Right Eye Progression has been stable. Macula : normal observations. Vessels : Neovascularization.   Left Eye Progression has been stable. Disc findings include normal observations. Macula : normal observations. Vessels : normal observations.   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  OS completely quiescent PDR  2.  OD, room for completion of PRP will do so upon his next visit.  3.  Ophthalmic Meds Ordered this visit:  No orders of the defined types were placed in this encounter.      Return in about 6 months (around 12/02/2020) for DILATE OU, PRP, OD.  There are no Patient Instructions on file for this visit.   Explained the diagnoses, plan, and follow up with the patient and they expressed understanding.  Patient expressed understanding of the importance of proper follow up care.   Clent Demark Kalese Ensz M.D. Diseases & Surgery of the Retina and Vitreous Retina & Diabetic [place] 06/04/20     Abbreviations: M myopia (nearsighted); A astigmatism; H hyperopia (farsighted); P presbyopia; Mrx spectacle prescription;  CTL contact lenses; OD right eye; OS left eye; OU both eyes  XT exotropia; ET esotropia; PEK punctate epithelial keratitis; PEE punctate epithelial erosions; DES dry eye syndrome; MGD meibomian gland dysfunction; ATs artificial tears; PFAT's preservative free artificial tears; [place] nuclear sclerotic cataract; PSC posterior subcapsular cataract; ERM epi-retinal membrane; PVD posterior vitreous  detachment; RD retinal detachment; DM diabetes mellitus; DR diabetic retinopathy; NPDR non-proliferative diabetic retinopathy; PDR proliferative diabetic retinopathy; CSME clinically significant macular edema; DME diabetic macular edema; dbh dot blot hemorrhages; CWS cotton wool spot; POAG primary open angle glaucoma; C/D cup-to-disc ratio; HVF humphrey visual field; GVF goldmann visual field; OCT optical coherence tomography; IOP intraocular pressure; BRVO Branch retinal vein occlusion; CRVO central retinal vein occlusion; CRAO central retinal artery occlusion; BRAO branch retinal artery occlusion; RT retinal tear; SB scleral buckle; PPV pars plana vitrectomy; VH Vitreous hemorrhage; PRP panretinal laser photocoagulation; IVK intravitreal kenalog; VMT vitreomacular traction; MH Macular hole;  NVD neovascularization of the disc; NVE neovascularization elsewhere; AREDS age related eye disease study; ARMD age related macular degeneration; POAG primary open angle glaucoma; EBMD epithelial/anterior basement membrane dystrophy; ACIOL anterior chamber intraocular lens; IOL intraocular lens; PCIOL posterior chamber intraocular lens; Phaco/IOL phacoemulsification with intraocular lens placement; [place] photorefractive keratectomy; LASIK laser assisted in situ keratomileusis; HTN hypertension; DM diabetes mellitus; COPD chronic obstructive pulmonary disease

## 2020-06-04 NOTE — Assessment & Plan Note (Signed)
Will observe left eye, condition stable

## 2020-06-04 NOTE — Assessment & Plan Note (Signed)
Good PRP anteriorly and inferiorly OD, clear media, with contralateral high risk features now stabilized post vitrectomy and PRP, will deliver PRP OD in the future posteriorly and superiorly on nasal

## 2020-06-11 ENCOUNTER — Other Ambulatory Visit: Payer: Self-pay

## 2020-06-11 ENCOUNTER — Ambulatory Visit (INDEPENDENT_AMBULATORY_CARE_PROVIDER_SITE_OTHER): Payer: Medicare HMO | Admitting: Endocrinology

## 2020-06-11 ENCOUNTER — Encounter: Payer: Self-pay | Admitting: Endocrinology

## 2020-06-11 VITALS — BP 120/84 | HR 102 | Wt 144.0 lb

## 2020-06-11 DIAGNOSIS — E1065 Type 1 diabetes mellitus with hyperglycemia: Secondary | ICD-10-CM | POA: Diagnosis not present

## 2020-06-11 NOTE — Patient Instructions (Signed)
Patient will have labs on the same day as office visit

## 2020-06-11 NOTE — Progress Notes (Signed)
Patient ID: Nathaniel [name], male   DOB: Apr 01, 1986, 34 y.o.   MRN: [id number]           Reason for Appointment : Follow-up for Type 1 Diabetes  History of Present Illness           Diagnosis: Type 1 diabetes mellitus, date of diagnosis:  ?  2004        Previous history:  He has had recurrent hospitalizations for ketoacidosis especially in 2017 and 2018, last episode was in 01/2018 Overall has had consistently poor control with A1c as high as 16 In the past he has required large doses of at least basal insulin, taking up to 200 units of Lantus daily at some point  Recent history:     INSULIN regimen is: Novolog mostly 4 units before meals  Tresiba 7 units daily  His A1c is recently 10.3 compared to 11  Current management, blood sugar patterns and problems identified:    Glucose monitoring:  He was told to look into the freestyle libre and since his prescription was not filled at the drugstore he did not get this but also did not let us know it was not available  Also was told to check his blood sugar before and after his main meal to help adjust his NovoLog but he is not doing this  He is again checking his blood sugars mostly before his first meal which is usually in the late afternoon or early evening  As before blood sugars are highly variable with no consistent pattern  He had 3 days in a row where his blood sugars were low in the late afternoon but none for the last 10 days  He is adjusting his NovoLog somewhat based on what he is eating and taking up to 7 units  However not clear if his blood sugars are controlled with this  He says he is trying to avoid regular soft drinks   Glucose monitoring:  is being done mostly 1 times a day         Glucometer:  One Touch ultra 2       Blood Glucose readings as from meter download   PRE-MEAL Fasting  midday  2-5 PM  5-10 PM Overall  Glucose range:   75-600+  54-256  49-600+  49-600+  Mean/median:    155   236+/-196    POST-MEAL PC Breakfast PC Lunch PC Dinner  Glucose range:   ?  Mean/median:         PREVIOUS CGM: Dates of Recording: 1/6 through 1/19  Sensor description: [employer] professional   CGM use % of time  100  Average and SD  283, GV 58  Time in range    18    %  % Time Above 180  11  % Time above 250  56  % Time Below target  15    Glycemic patterns summary: Blood sugars are showing extreme variability at all times HIGHEST blood sugars are late at night rising after about 9 PM and not starting to come down to at least 7 AM He has frequent hypoglycemia on about half of the days studied with more frequent hypoglycemia between 1 PM and 6 PM Average blood sugar on any given day varies between 130 and 431  Hypoglycemia:  As above Symptoms of hypoglycemia: Feeling tired, sweaty, slurred speech, weakness Treatment of hypoglycemia: Juice usually, sometimes glucose tablets, usually 2 at a time     Self-care:  The diet that the patient has been following is: None  Mealtimes are: Very variable              Dietician consultation: Most recent: 06/2018     CDE consultation: Years ago  Diabetes labs:  Lab Results  Component Value Date   HGBA1C 10.3 (A) 03/20/2020   HGBA1C 11.0 (A) 09/25/2019   HGBA1C 9.3 (A) 06/11/2019   Lab Results  Component Value Date   LDLCALC 128 (H) 03/19/2020   CREATININE 2.70 (H) 03/19/2020    No results found for: MICRALBCREAT  Wt Readings from Last 3 Encounters:  06/11/20 144 lb (65.3 kg)  04/30/20 141 lb 12.8 oz (64.3 kg)  03/19/20 141 lb 9.6 oz (64.2 kg)     Allergies as of 06/11/2020      Reactions   Fructose Other (See Comments)   Increase of blood sugar      Medication List       Accurate as of June 11, 2020  4:15 PM. If you have any questions, ask your nurse or doctor.        acetaminophen 325 MG tablet Commonly known as: TYLENOL Take 1-2 tablets (325-650 mg total) by mouth every 4 (four) hours as needed for mild  pain.   amLODipine 10 MG tablet Commonly known as: NORVASC Take 1 tablet (10 mg total) by mouth daily.   aspirin 325 MG EC tablet TAKE 1 TABLET BY MOUTH EVERY DAY   atorvastatin 80 MG tablet Commonly known as: LIPITOR Take 1 tablet (80 mg total) by mouth daily at 6 PM.   clopidogrel 75 MG tablet Commonly known as: PLAVIX Take 75 mg by mouth daily.   FreeStyle Libre 2 Sensor Misc 2 Devices by Does not apply route every 14 (fourteen) days.   glucagon 1 MG injection Inject 1 mg into the vein once as needed for up to 1 dose.   insulin aspart 100 UNIT/ML FlexPen Commonly known as: NOVOLOG Inject per sliding scale before mealsI: Blood sugars between 200-250 Take 2 units, 250-300 take 3 units, Over 300 Take 4 units   lisinopril 40 MG tablet Commonly known as: ZESTRIL Take 1 tablet (40 mg total) by mouth daily.   OneTouch Ultra test strip Generic drug: glucose blood USE TO TEST BLOOD SUGAR 4 TIMES A DAY (INSURANCE WILL ONLY PAY FOR HIM TO TEST 3 TIMES A DAY)   Pen Needles 3/16" 31G X 5 MM Misc Four times day   Tresiba FlexTouch 100 UNIT/ML FlexTouch Pen Generic drug: insulin degludec Patient injecting 7 units daily.       Allergies:  Allergies  Allergen Reactions  . Fructose Other (See Comments)    Increase of blood sugar     Past Medical History:  Diagnosis Date  . Diabetes mellitus    lantus/novolog  . Hyperlipidemia   . Hypertension   . Marijuana abuse    occaisionally  . Noncompliance with medication regimen   . Stroke ([place])   . Tobacco abuse    5/day    Past Surgical History:  Procedure Laterality Date  . EYE SURGERY      Family History  Problem Relation Age of Onset  . Diabetes Father   . Kidney failure Father        last 4 mnths of life  . Hypertension Mother   . Diabetes Maternal Grandmother   . Pancreatic cancer Maternal Grandfather   . Diabetes Paternal Grandfather     Social History:  reports that he  has been smoking cigarettes. He  has a 2.00 pack-year smoking history. He has never used smokeless tobacco. He reports current drug use. Drug: Marijuana. He reports that he does not drink alcohol.      Review of Systems      Lipids: After his CVA in June 2019 he was started on Lipitor 80 mg, last LDL as follows  Apparently was not taking his atorvastatin when he had his labs in 6/21 and not clear if he is taking this regularly now  Lab Results  Component Value Date   CHOL 198 03/19/2020   HDL 38.20 (L) 03/19/2020   LDLCALC 128 (H) 03/19/2020   TRIG 162.0 (H) 03/19/2020   CHOLHDL 5 03/19/2020   HYPERTENSION: Has been on treatment for several years from nephrologist  Blood pressure medications including lisinopril 40 mg, and amlodipine are being prescribed by his PCP   BP Readings from Last 3 Encounters:  06/11/20 120/84  04/30/20 120/72  03/19/20 118/78    Has chronic kidney disease, seen by nephrologist regularly  Lab Results  Component Value Date   CREATININE 2.70 (H) 03/19/2020   CREATININE 2.82 (H) 03/05/2019   CREATININE 1.64 (H) 07/07/2018   He has had a thyroid nodule on the right side but did not go for his ultrasound again  Lab Results  Component Value Date   TSH 0.97 03/06/2019      LABS:  No visits with results within 1 Week(s) from this visit.  Latest known visit with results is:  Office Visit on 03/19/2020  Component Date Value Ref Range Status  . Cholesterol 03/19/2020 198  0 - 200 mg/dL Final   ATP III Classification       Desirable:  < 200 mg/dL               Borderline High:  200 - 239 mg/dL          High:  > = 240 mg/dL  . Triglycerides 03/19/2020 162.0* 0 - 149 mg/dL Final   Normal:  <150 mg/dLBorderline High:  150 - 199 mg/dL  . HDL 03/19/2020 38.20* >39.00 mg/dL Final  . VLDL 03/19/2020 32.4  0.0 - 40.0 mg/dL Final  . LDL Cholesterol 03/19/2020 128* 0 - 99 mg/dL Final  . Total CHOL/HDL Ratio 03/19/2020 5   Final                  Men          Women1/2 Average Risk      3.4          3.3Average Risk          5.0          4.42X Average Risk          9.6          7.13X Average Risk          15.0          11.0                      . NonHDL 03/19/2020 160.12   Final   NOTE:  Non-HDL goal should be 30 mg/dL higher than patient's LDL goal (i.e. LDL goal of < 70 mg/dL, would have non-HDL goal of < 100 mg/dL)  . Sodium 03/19/2020 134* 135 - 145 mEq/L Final  . Potassium 03/19/2020 4.7  3.5 - 5.1 mEq/L Final  . Chloride 03/19/2020 99  96 - 112 mEq/L Final  .  CO2 03/19/2020 26  19 - 32 mEq/L Final  . Glucose, Bld 03/19/2020 494* 70 - 99 mg/dL Final  . BUN 03/19/2020 34* 6 - 23 mg/dL Final  . Creatinine, Ser 03/19/2020 2.70* 0.40 - 1.50 mg/dL Final  . Total Bilirubin 03/19/2020 0.4  0.2 - 1.2 mg/dL Final  . Alkaline Phosphatase 03/19/2020 92  39 - 117 U/L Final  . AST 03/19/2020 12  0 - 37 U/L Final  . ALT 03/19/2020 9  0 - 53 U/L Final  . Total Protein 03/19/2020 7.3  6.0 - 8.3 g/dL Final  . Albumin 03/19/2020 3.7  3.5 - 5.2 g/dL Final  . GFR 03/19/2020 32.98* >60.00 mL/min Final  . Calcium 03/19/2020 9.1  8.4 - 10.5 mg/dL Final  . Hemoglobin A1C 03/20/2020 10.3* 4.0 - 5.6 % Final    Physical Examination:  BP 120/84   Pulse (!) 102   Wt 144 lb (65.3 kg)   SpO2 99%   BMI 18.49 kg/m     ASSESSMENT:  Diabetes type 1, persistently poorly controlled  A1c is last 10.3 compared to 11% and consistently high  Blood sugars are highly variable He will benefit from using the Dexcom which will allow him to high and low blood sugars Showed him how this would work and benefits since he is not checking blood sugars enough This will also likely be more accurate than the freestyle libre  PLAN:   Continue 7 units Tresiba Again reminded him to check readings 2 hours after meals to help adjust his NovoLog and keep blood sugars under at least 200 He will call Dex[employer] to see if this will be covered and let us know when he is able to get this for training   There are  no Patient Instructions on file for this visit.    Elayne Snare 06/11/2020, 4:15 PM      Note: This note was prepared with Dragon voice recognition system technology. Any transcriptional errors that result from this process are unintentional.

## 2020-06-18 ENCOUNTER — Other Ambulatory Visit: Payer: Self-pay | Admitting: Endocrinology

## 2020-07-11 ENCOUNTER — Telehealth: Payer: Self-pay | Admitting: Endocrinology

## 2020-07-11 NOTE — Telephone Encounter (Signed)
Patient's mother called re: Patient is completely out of the following medication and needs a RX for the following sent asap with directions stating that if patient's blood sugars are running high, patient should increase dosage (PHARM told patient's mother to have that statement on the RX directions-because that is why patient ran out:  Medication Refill Request  Did you call your pharmacy and request this refill first? Yes  If patient has not contacted pharmacy first, instruct them to do so for future refills.   Remind them that contacting the pharmacy for their refill is the quickest method to get the refill.   Refill policy also stated that it will take anywhere between 24-72 hours to receive the refill.    Name of medication?  insulin degludec (TRESIBA FLEXTOUCH) 100 UNIT/ML FlexTouch Pen  Is this a 90 day supply? Yes  Name and location of pharmacy?  [id number] - [place], [place] Phone:  [phone]  Fax:  [phone]     Patient's mother Curly [place] requests to be called at ph# [phone] once the above has been sent to [facility] so that she can go pick up the RX immediately.

## 2020-07-11 NOTE — Telephone Encounter (Signed)
Please advice  

## 2020-07-12 ENCOUNTER — Other Ambulatory Visit: Payer: Self-pay | Admitting: Endocrinology

## 2020-07-12 MED ORDER — TRESIBA FLEXTOUCH 100 UNIT/ML ~~LOC~~ SOPN: PEN_INJECTOR | SUBCUTANEOUS | 1 refills | Status: DC

## 2020-07-14 ENCOUNTER — Other Ambulatory Visit: Payer: Self-pay

## 2020-07-14 ENCOUNTER — Telehealth: Payer: Self-pay | Admitting: Endocrinology

## 2020-07-14 ENCOUNTER — Inpatient Hospital Stay (HOSPITAL_COMMUNITY)
Admission: EM | Admit: 2020-07-14 | Discharge: 2020-07-16 | DRG: 637 | Disposition: A | Discharge status: Home / Self Care | Payer: Medicare HMO | Attending: Family Medicine | Admitting: Family Medicine

## 2020-07-14 ENCOUNTER — Emergency Department (HOSPITAL_COMMUNITY): Payer: Medicare HMO

## 2020-07-14 ENCOUNTER — Encounter (HOSPITAL_COMMUNITY): Payer: Self-pay

## 2020-07-14 DIAGNOSIS — F17210 Nicotine dependence, cigarettes, uncomplicated: Secondary | ICD-10-CM | POA: Diagnosis present

## 2020-07-14 DIAGNOSIS — N183 Chronic kidney disease, stage 3 unspecified: Secondary | ICD-10-CM | POA: Diagnosis present

## 2020-07-14 DIAGNOSIS — N179 Acute kidney failure, unspecified: Secondary | ICD-10-CM | POA: Diagnosis present

## 2020-07-14 DIAGNOSIS — E103593 Type 1 diabetes mellitus with proliferative diabetic retinopathy without macular edema, bilateral: Secondary | ICD-10-CM | POA: Diagnosis present

## 2020-07-14 DIAGNOSIS — E1010 Type 1 diabetes mellitus with ketoacidosis without coma: Principal | ICD-10-CM | POA: Diagnosis present

## 2020-07-14 DIAGNOSIS — Z8673 Personal history of transient ischemic attack (TIA), and cerebral infarction without residual deficits: Secondary | ICD-10-CM | POA: Diagnosis not present

## 2020-07-14 DIAGNOSIS — Z833 Family history of diabetes mellitus: Secondary | ICD-10-CM

## 2020-07-14 DIAGNOSIS — Z91018 Allergy to other foods: Secondary | ICD-10-CM

## 2020-07-14 DIAGNOSIS — E1122 Type 2 diabetes mellitus with diabetic chronic kidney disease: Secondary | ICD-10-CM | POA: Diagnosis present

## 2020-07-14 DIAGNOSIS — E876 Hypokalemia: Secondary | ICD-10-CM | POA: Diagnosis present

## 2020-07-14 DIAGNOSIS — Z7982 Long term (current) use of aspirin: Secondary | ICD-10-CM

## 2020-07-14 DIAGNOSIS — Z20822 Contact with and (suspected) exposure to covid-19: Secondary | ICD-10-CM | POA: Diagnosis present

## 2020-07-14 DIAGNOSIS — N1832 Chronic kidney disease, stage 3b: Secondary | ICD-10-CM | POA: Diagnosis present

## 2020-07-14 DIAGNOSIS — A523 Neurosyphilis, unspecified: Secondary | ICD-10-CM | POA: Diagnosis present

## 2020-07-14 DIAGNOSIS — Z9114 Patient's other noncompliance with medication regimen: Secondary | ICD-10-CM

## 2020-07-14 DIAGNOSIS — E1065 Type 1 diabetes mellitus with hyperglycemia: Secondary | ICD-10-CM

## 2020-07-14 DIAGNOSIS — Z794 Long term (current) use of insulin: Secondary | ICD-10-CM

## 2020-07-14 DIAGNOSIS — Z9119 Patient's noncompliance with other medical treatment and regimen: Secondary | ICD-10-CM

## 2020-07-14 DIAGNOSIS — F32A Depression, unspecified: Secondary | ICD-10-CM | POA: Diagnosis present

## 2020-07-14 DIAGNOSIS — R4182 Altered mental status, unspecified: Secondary | ICD-10-CM

## 2020-07-14 DIAGNOSIS — E782 Mixed hyperlipidemia: Secondary | ICD-10-CM | POA: Diagnosis present

## 2020-07-14 DIAGNOSIS — R6510 Systemic inflammatory response syndrome (SIRS) of non-infectious origin without acute organ dysfunction: Secondary | ICD-10-CM | POA: Diagnosis not present

## 2020-07-14 DIAGNOSIS — R680 Hypothermia, not associated with low environmental temperature: Secondary | ICD-10-CM | POA: Diagnosis present

## 2020-07-14 DIAGNOSIS — G9341 Metabolic encephalopathy: Secondary | ICD-10-CM | POA: Diagnosis present

## 2020-07-14 DIAGNOSIS — R6511 Systemic inflammatory response syndrome (SIRS) of non-infectious origin with acute organ dysfunction: Secondary | ICD-10-CM | POA: Diagnosis present

## 2020-07-14 DIAGNOSIS — E1110 Type 2 diabetes mellitus with ketoacidosis without coma: Secondary | ICD-10-CM | POA: Diagnosis present

## 2020-07-14 DIAGNOSIS — I129 Hypertensive chronic kidney disease with stage 1 through stage 4 chronic kidney disease, or unspecified chronic kidney disease: Secondary | ICD-10-CM | POA: Diagnosis present

## 2020-07-14 DIAGNOSIS — Z7902 Long term (current) use of antithrombotics/antiplatelets: Secondary | ICD-10-CM | POA: Diagnosis not present

## 2020-07-14 DIAGNOSIS — D631 Anemia in chronic kidney disease: Secondary | ICD-10-CM | POA: Diagnosis present

## 2020-07-14 DIAGNOSIS — I10 Essential (primary) hypertension: Secondary | ICD-10-CM | POA: Diagnosis present

## 2020-07-14 DIAGNOSIS — E1022 Type 1 diabetes mellitus with diabetic chronic kidney disease: Secondary | ICD-10-CM | POA: Diagnosis present

## 2020-07-14 DIAGNOSIS — E860 Dehydration: Secondary | ICD-10-CM | POA: Diagnosis present

## 2020-07-14 DIAGNOSIS — E875 Hyperkalemia: Secondary | ICD-10-CM | POA: Diagnosis not present

## 2020-07-14 DIAGNOSIS — Z79899 Other long term (current) drug therapy: Secondary | ICD-10-CM

## 2020-07-14 LAB — GLUCOSE, CAPILLARY
Glucose-Capillary: >600 mg/dL (ref 70–99)
Glucose-Capillary: >600 mg/dL (ref 70–99)
Glucose-Capillary: >600 mg/dL (ref 70–99)
Glucose-Capillary: >600 mg/dL (ref 70–99)
Glucose-Capillary: >600 mg/dL (ref 70–99)
Glucose-Capillary: >600 mg/dL (ref 70–99)

## 2020-07-14 LAB — COMPREHENSIVE METABOLIC PANEL
ALT: 17 U/L (ref 0–44)
AST: 16 U/L (ref 15–41)
Albumin: 4 g/dL (ref 3.5–5.0)
Alkaline Phosphatase: 110 U/L (ref 38–126)
BUN: 88 mg/dL — ABNORMAL HIGH (ref 6–20)
CO2: <7 mmol/L — ABNORMAL LOW (ref 22–32)
Calcium: 7.9 mg/dL — ABNORMAL LOW (ref 8.9–10.3)
Chloride: 69 mmol/L — ABNORMAL LOW (ref 98–111)
Creatinine, Ser: 5.5 mg/dL — ABNORMAL HIGH (ref 0.61–1.24)
GFR, Estimated: 13 mL/min — ABNORMAL LOW (ref >60)
Glucose, Bld: 1487 mg/dL (ref 70–99)
Potassium: 8.3 mmol/L (ref 3.5–5.1)
Sodium: 110 mmol/L — CL (ref 135–145)
Total Bilirubin: 2.3 mg/dL — ABNORMAL HIGH (ref 0.3–1.2)
Total Protein: 7.5 g/dL (ref 6.5–8.1)

## 2020-07-14 LAB — I-STAT CHEM 8, ED
BUN: 82 mg/dL — ABNORMAL HIGH (ref 6–20)
BUN: 90 mg/dL — ABNORMAL HIGH (ref 6–20)
Calcium, Ion: 0.82 mmol/L — CL (ref 1.15–1.40)
Calcium, Ion: 0.83 mmol/L — CL (ref 1.15–1.40)
Chloride: 78 mmol/L — ABNORMAL LOW (ref 98–111)
Chloride: 77 mmol/L — ABNORMAL LOW (ref 98–111)
Creatinine, Ser: 5.4 mg/dL — ABNORMAL HIGH (ref 0.61–1.24)
Creatinine, Ser: 5.4 mg/dL — ABNORMAL HIGH (ref 0.61–1.24)
Glucose, Bld: >700 mg/dL (ref 70–99)
Glucose, Bld: >700 mg/dL (ref 70–99)
HCT: 39 % (ref 39.0–52.0)
HCT: 39 % (ref 39.0–52.0)
Hemoglobin: 13.3 g/dL (ref 13.0–17.0)
Hemoglobin: 13.3 g/dL (ref 13.0–17.0)
Potassium: 8.1 mmol/L (ref 3.5–5.1)
Potassium: 8.3 mmol/L (ref 3.5–5.1)
Sodium: 108 mmol/L — CL (ref 135–145)
Sodium: 108 mmol/L — CL (ref 135–145)
TCO2: 6 mmol/L — ABNORMAL LOW (ref 22–32)
TCO2: 6 mmol/L — ABNORMAL LOW (ref 22–32)

## 2020-07-14 LAB — AMMONIA: Ammonia: 36 umol/L — ABNORMAL HIGH (ref 9–35)

## 2020-07-14 LAB — CBC WITH DIFFERENTIAL/PLATELET
Abs Immature Granulocytes: 0.11 10*3/uL — ABNORMAL HIGH (ref 0.00–0.07)
Basophils Absolute: 0.1 10*3/uL (ref 0.0–0.1)
Basophils Relative: 0 %
Eosinophils Absolute: 0 10*3/uL (ref 0.0–0.5)
Eosinophils Relative: 0 %
HCT: 37.8 % — ABNORMAL LOW (ref 39.0–52.0)
Hemoglobin: 11 g/dL — ABNORMAL LOW (ref 13.0–17.0)
Immature Granulocytes: 1 %
Lymphocytes Relative: 3 %
Lymphs Abs: 0.7 10*3/uL (ref 0.7–4.0)
MCH: 29.6 pg (ref 26.0–34.0)
MCHC: 29.1 g/dL — ABNORMAL LOW (ref 30.0–36.0)
MCV: 101.9 fL — ABNORMAL HIGH (ref 80.0–100.0)
Monocytes Absolute: 0.8 10*3/uL (ref 0.1–1.0)
Monocytes Relative: 4 %
Neutro Abs: 21.1 10*3/uL — ABNORMAL HIGH (ref 1.7–7.7)
Neutrophils Relative %: 92 %
Platelets: 372 10*3/uL (ref 150–400)
RBC: 3.71 MIL/uL — ABNORMAL LOW (ref 4.22–5.81)
RDW: 13.3 % (ref 11.5–15.5)
WBC: 22.8 10*3/uL — ABNORMAL HIGH (ref 4.0–10.5)
nRBC: 0 % (ref 0.0–0.2)

## 2020-07-14 LAB — RESPIRATORY PANEL BY RT PCR (FLU A&B, COVID)
Influenza A by PCR: NEGATIVE
Influenza B by PCR: NEGATIVE
SARS Coronavirus 2 by RT PCR: NEGATIVE

## 2020-07-14 LAB — URINALYSIS, COMPLETE (UACMP) WITH MICROSCOPIC
Bilirubin Urine: NEGATIVE
Glucose, UA: >=500 mg/dL — AB
Hgb urine dipstick: NEGATIVE
Ketones, ur: 20 mg/dL — AB
Leukocytes,Ua: NEGATIVE
Nitrite: NEGATIVE
Protein, ur: 100 mg/dL — AB
Specific Gravity, Urine: 1.017 (ref 1.005–1.030)
pH: 5 (ref 5.0–8.0)

## 2020-07-14 LAB — BASIC METABOLIC PANEL
Anion gap: >20 — ABNORMAL HIGH (ref 5–15)
BUN: 87 mg/dL — ABNORMAL HIGH (ref 6–20)
CO2: 9 mmol/L — ABNORMAL LOW (ref 22–32)
Calcium: 7.6 mg/dL — ABNORMAL LOW (ref 8.9–10.3)
Chloride: 75 mmol/L — ABNORMAL LOW (ref 98–111)
Creatinine, Ser: 5.53 mg/dL — ABNORMAL HIGH (ref 0.61–1.24)
GFR, Estimated: 13 mL/min — ABNORMAL LOW (ref >60)
Glucose, Bld: 1350 mg/dL (ref 70–99)
Potassium: 5.9 mmol/L — ABNORMAL HIGH (ref 3.5–5.1)
Sodium: 116 mmol/L — CL (ref 135–145)

## 2020-07-14 LAB — LACTIC ACID, PLASMA
Lactic Acid, Venous: 6.4 mmol/L (ref 0.5–1.9)
Lactic Acid, Venous: 6.4 mmol/L (ref 0.5–1.9)
Lactic Acid, Venous: 2 mmol/L (ref 0.5–1.9)
Lactic Acid, Venous: 1.8 mmol/L (ref 0.5–1.9)

## 2020-07-14 LAB — BLOOD GAS, ARTERIAL
Acid-base deficit: 23.4 mmol/L — ABNORMAL HIGH (ref 0.0–2.0)
Bicarbonate: 7.4 mmol/L — ABNORMAL LOW (ref 20.0–28.0)
FIO2: 21
O2 Saturation: 96.5 %
Patient temperature: 37
pCO2 arterial: 18.4 mmHg — CL (ref 32.0–48.0)
pH, Arterial: 7.061 — CL (ref 7.350–7.450)
pO2, Arterial: 128 mmHg — ABNORMAL HIGH (ref 83.0–108.0)

## 2020-07-14 LAB — RAPID URINE DRUG SCREEN, HOSP PERFORMED
Amphetamines: NOT DETECTED
Barbiturates: NOT DETECTED
Benzodiazepines: NOT DETECTED
Cocaine: NOT DETECTED
Opiates: NOT DETECTED
Tetrahydrocannabinol: POSITIVE — AB

## 2020-07-14 LAB — OSMOLALITY: Osmolality: 382 mOsm/kg (ref 275–295)

## 2020-07-14 LAB — BETA-HYDROXYBUTYRIC ACID: Beta-Hydroxybutyric Acid: >8 mmol/L — ABNORMAL HIGH (ref 0.05–0.27)

## 2020-07-14 LAB — CBG MONITORING, ED
Glucose-Capillary: >600 mg/dL (ref 70–99)
Glucose-Capillary: >600 mg/dL (ref 70–99)
Glucose-Capillary: >600 mg/dL (ref 70–99)
Glucose-Capillary: >600 mg/dL (ref 70–99)
Glucose-Capillary: >600 mg/dL (ref 70–99)

## 2020-07-14 LAB — PROCALCITONIN: Procalcitonin: 1.43 ng/mL

## 2020-07-14 LAB — MAGNESIUM: Magnesium: 2.5 mg/dL — ABNORMAL HIGH (ref 1.7–2.4)

## 2020-07-14 LAB — LIPASE, BLOOD: Lipase: 28 U/L (ref 11–51)

## 2020-07-14 LAB — HIV ANTIBODY (ROUTINE TESTING W REFLEX): HIV Screen 4th Generation wRfx: NONREACTIVE

## 2020-07-14 LAB — MRSA PCR SCREENING: MRSA by PCR: NEGATIVE

## 2020-07-14 LAB — ETHANOL: Alcohol, Ethyl (B): <10 mg/dL (ref <10)

## 2020-07-14 LAB — TROPONIN I (HIGH SENSITIVITY)
Troponin I (High Sensitivity): 879 ng/L (ref <18)
Troponin I (High Sensitivity): 1232 ng/L (ref <18)

## 2020-07-14 MED ORDER — SODIUM CHLORIDE 0.9 % IV SOLN
2.0000 g | INTRAVENOUS | Status: DC
  Administered 2020-07-15: 2 g via INTRAVENOUS
  Filled 2020-07-14: qty 2

## 2020-07-14 MED ORDER — ACETAMINOPHEN 325 MG PO TABS: 650.0000 mg | ORAL_TABLET | Freq: Four times a day (QID) | ORAL | Status: DC | PRN

## 2020-07-14 MED ORDER — CALCIUM GLUCONATE-NACL 1-0.675 GM/50ML-% IV SOLN
1.0000 g | Freq: Once | INTRAVENOUS | Status: AC
  Administered 2020-07-14: 1000 mg via INTRAVENOUS
  Filled 2020-07-14: qty 50

## 2020-07-14 MED ORDER — ATORVASTATIN CALCIUM 40 MG PO TABS
80.0000 mg | ORAL_TABLET | Freq: Every day | ORAL | Status: DC
  Filled 2020-07-14: qty 2

## 2020-07-14 MED ORDER — INSULIN ASPART 100 UNIT/ML IV SOLN
10.0000 [IU] | Freq: Once | INTRAVENOUS | Status: AC
  Administered 2020-07-14: 10 [IU] via INTRAVENOUS

## 2020-07-14 MED ORDER — ASPIRIN EC 325 MG PO TBEC
325.0000 mg | DELAYED_RELEASE_TABLET | Freq: Every day | ORAL | Status: DC
  Administered 2020-07-15 – 2020-07-16 (×2): 325 mg via ORAL
  Filled 2020-07-14 (×2): qty 1

## 2020-07-14 MED ORDER — METRONIDAZOLE IN NACL 5-0.79 MG/ML-% IV SOLN
500.0000 mg | Freq: Once | INTRAVENOUS | Status: AC
  Administered 2020-07-14: 500 mg via INTRAVENOUS
  Filled 2020-07-14: qty 100

## 2020-07-14 MED ORDER — LACTATED RINGERS IV BOLUS
1000.0000 mL | Freq: Once | INTRAVENOUS | Status: AC
  Administered 2020-07-14: 1000 mL via INTRAVENOUS

## 2020-07-14 MED ORDER — AMLODIPINE BESYLATE 5 MG PO TABS: 10.0000 mg | ORAL_TABLET | Freq: Every day | ORAL | Status: DC

## 2020-07-14 MED ORDER — ACETAMINOPHEN 650 MG RE SUPP: 650.0000 mg | Freq: Four times a day (QID) | RECTAL | Status: DC | PRN

## 2020-07-14 MED ORDER — SODIUM BICARBONATE 8.4 % IV SOLN
INTRAVENOUS | Status: DC
  Filled 2020-07-14 (×4): qty 850

## 2020-07-14 MED ORDER — LACTATED RINGERS IV BOLUS
2000.0000 mL | Freq: Once | INTRAVENOUS | Status: AC
  Administered 2020-07-14: 2000 mL via INTRAVENOUS

## 2020-07-14 MED ORDER — DEXTROSE 50 % IV SOLN
0.0000 mL | INTRAVENOUS | Status: DC | PRN
  Administered 2020-07-15: 50 mL via INTRAVENOUS
  Filled 2020-07-14: qty 50

## 2020-07-14 MED ORDER — CLOPIDOGREL BISULFATE 75 MG PO TABS
75.0000 mg | ORAL_TABLET | Freq: Every day | ORAL | Status: DC
  Administered 2020-07-15 – 2020-07-16 (×2): 75 mg via ORAL
  Filled 2020-07-14 (×3): qty 1

## 2020-07-14 MED ORDER — HEPARIN SODIUM (PORCINE) 5000 UNIT/ML IJ SOLN
5000.0000 [IU] | Freq: Three times a day (TID) | INTRAMUSCULAR | Status: DC
  Administered 2020-07-15: 5000 [IU] via SUBCUTANEOUS
  Filled 2020-07-14 (×2): qty 1

## 2020-07-14 MED ORDER — INSULIN REGULAR(HUMAN) IN NACL 100-0.9 UT/100ML-% IV SOLN
INTRAVENOUS | Status: DC
  Administered 2020-07-14: 5.5 [IU]/h via INTRAVENOUS
  Administered 2020-07-15: 1.4 [IU]/h via INTRAVENOUS
  Filled 2020-07-14 (×2): qty 100

## 2020-07-14 MED ORDER — SODIUM BICARBONATE 8.4 % IV SOLN
50.0000 meq | Freq: Once | INTRAVENOUS | Status: AC
  Administered 2020-07-14: 50 meq via INTRAVENOUS

## 2020-07-14 MED ORDER — VANCOMYCIN HCL IN DEXTROSE 1-5 GM/200ML-% IV SOLN
1000.0000 mg | Freq: Once | INTRAVENOUS | Status: AC
  Administered 2020-07-14: 1000 mg via INTRAVENOUS
  Filled 2020-07-14: qty 200

## 2020-07-14 MED ORDER — DEXTROSE IN LACTATED RINGERS 5 % IV SOLN: INTRAVENOUS | Status: DC

## 2020-07-14 MED ORDER — NICOTINE 21 MG/24HR TD PT24
21.0000 mg | MEDICATED_PATCH | Freq: Every day | TRANSDERMAL | Status: DC
  Administered 2020-07-15 – 2020-07-16 (×2): 21 mg via TRANSDERMAL
  Filled 2020-07-14 (×2): qty 1

## 2020-07-14 MED ORDER — PROMETHAZINE HCL 12.5 MG PO TABS: 12.5000 mg | ORAL_TABLET | Freq: Four times a day (QID) | ORAL | Status: DC | PRN

## 2020-07-14 MED ORDER — LACTATED RINGERS IV SOLN: INTRAVENOUS | Status: DC

## 2020-07-14 MED ORDER — VANCOMYCIN HCL 750 MG/150ML IV SOLN: 750.0000 mg | INTRAVENOUS | Status: DC

## 2020-07-14 MED ORDER — OXYCODONE HCL 5 MG PO TABS
5.0000 mg | ORAL_TABLET | ORAL | Status: DC | PRN
  Administered 2020-07-16: 5 mg via ORAL
  Filled 2020-07-14: qty 1

## 2020-07-14 MED ORDER — SODIUM CHLORIDE 0.9 % IV SOLN
2.0000 g | Freq: Once | INTRAVENOUS | Status: AC
  Administered 2020-07-14: 2 g via INTRAVENOUS
  Filled 2020-07-14: qty 2

## 2020-07-14 NOTE — Telephone Encounter (Signed)
Patient returned Anna's call - if someone could give her a call back in the morning.

## 2020-07-14 NOTE — Telephone Encounter (Signed)
Patient's mother called stating the pharmacy got the refill for the tresiba last week but the dosage changed and it needs to be adjusted because the insurance won't cover it due to being too early. Patient needed a new Rx called in with the changed dosage to:  [id number] - [place], [address] [facility]  [place], [place] [address]  Phone:  [phone] Fax:  [phone]

## 2020-07-14 NOTE — ED Provider Notes (Signed)
Patient is a 34 year old male, presents critically ill with severe hyperglycemia, tachycardia, altered mental status.  On exam this patient has appearance of severe dehydration with dry mucous membranes, weak pulses, no edema, he is confused and altered but able to barely follow commands.  Blood sugar is on measurably high, evidently this patient has a history of noncompliance and recurrent hypoglycemia, suspect DKA.  EKG is very abnormal with peaked T waves and wide QRS complexes highly suggestive of underlying hyperkalemia.  Calcium will be given, bicarb will be given, this patient will need a high level of care, multiple IV fluid boluses been given at this time.  Potassium is in fact over 8, calcium and bicarbonate and insulin evolving given.  His heart rate is slowing down slightly, IV fluids are being given, the patient is getting insulin, he has diabetic ketoacidosis with severe hyperkalemia.  Thankfully his blood pressure is 616 systolic, he is hypothermic at 95 requiring some warming and will need a high level of care this evening if not ICU level care.    EKG Interpretation  Date/Time:  Monday July 14 2020 13:16:11 EDT Ventricular Rate:  115 PR Interval:    QRS Duration: 187 QT Interval:  401 QTC Calculation: 555 R Axis:   -94 Text Interpretation: Sinus or ectopic atrial tachycardia Ventricular premature complex Prolonged PR interval Consider dextrocardia Baseline wander in lead(s) V6 Since last tracing TW hypertrophy seen Confirmed by Noemi Chapel [phone]) on 07/14/2020 1:38:51 PM       .Critical Care Performed by: Noemi Chapel, MD Authorized by: Noemi Chapel, MD   Critical care provider statement:    Critical care time (minutes):  35   Critical care time was exclusive of:  Separately billable procedures and treating other patients and teaching time   Critical care was necessary to treat or prevent imminent or life-threatening deterioration of the following conditions:   Endocrine crisis   Critical care was time spent personally by me on the following activities:  Blood draw for specimens, development of treatment plan with patient or surrogate, discussions with consultants, evaluation of patient's response to treatment, examination of patient, obtaining history from patient or surrogate, ordering and performing treatments and interventions, ordering and review of laboratory studies, ordering and review of radiographic studies, pulse oximetry, re-evaluation of patient's condition and review of old charts    Medical screening examination/treatment/procedure(s) were conducted as a shared visit with non-physician practitioner(s) and myself.  I personally evaluated the patient during the encounter.  Clinical Impression:   Final diagnoses:  Hyperkalemia  Acute renal failure, unspecified acute renal failure type ([place])  Type 1 diabetes mellitus with ketoacidosis without coma ([place])  Severe dehydration         Noemi Chapel, MD 07/15/20 [phone]

## 2020-07-14 NOTE — ED Notes (Signed)
Date and time results received: 07/14/20  1411 (use smartphrase ".now" to insert current time)  Test: Lactic Acid Critical Value: 6.4 Name of Provider Notified: Wyn Quaker PA Orders Received? Or Actions Taken?:NA

## 2020-07-14 NOTE — Progress Notes (Signed)
Notified Mid level of Troponin results

## 2020-07-14 NOTE — Telephone Encounter (Signed)
[facility] --stated the pt taking additional 2 units  when pt sugar is higher 200. Pt  mother stated the pt been taking at least 10 units a day. Also, pt stated  send same Rx since august 2021 but never change the instructions. Please advise

## 2020-07-14 NOTE — H&P (Signed)
TRH H&P    Patient Demographics:    Nathaniel [name], is a 34 y.o. male  MRN: [id number]  DOB - 1986/07/16  Admit Date - 07/14/2020  Referring MD/NP/PA: Phylliss Bob  Outpatient Primary MD for the patient is [name], MD  Patient coming from: Home  Chief complaint- Altered mental status   HPI:    Nathaniel [name]  is a 34 y.o. male, 34 year old male with a history of tobacco dependence, stroke, hypertension, hyperlipidemia, diabetes mellitus type 1, medical noncompliance presents to the ER with altered mental status.  History is limited as patient is somnolent, family cannot be reached.  Chart review reveals that patient had been having trouble getting access to his medications due to insurance coverage problem.  Family reported today that patient had not gotten out of bed or done anything for a few days, and sugars have been reading greater than 600, so family bringing him into ER.  Patient's history is inconsistent today.  He reports that he has been nauseous and vomiting 2 or 3 times a day for the last few days.  He reports that he has been out of his medication for 3 days.  When asked how much insulin he takes he reported that he takes 116 units at night.  According to the chart he takes 10 units of Tresiba at night.  Patient is on sliding scale during the day.  Patient is not able to describe his sliding scale.  He reports that he is only been out of his medications for 3 days, but it seems like his been longer than that.  Patient reports no hematemesis, no abdominal pain, no diarrhea.  He reports he is not on a diabetic diet.  In fact chart review reveals that all he had drink today was Hawaiian fruit punch.  Patient reports his last normal bowel movement was about a week ago.  He does smoke 3 or 4 packs a day per his report.  He would like a nicotine patch.  He does not use alcohol, or illicit drugs.  He does  report he has been vaccinated for Covid.  Not think he has been experiencing polydipsia, polyuria, polyphagia.  He reports that he has not been checking his sugar 4 times daily as his endocrinologist has recommended.  Patient also reports he has not felt feverish or chilled at home.  Patient has no other complaints at this time.  In the ED Temperature 95.1, heating blanket applied, heart rate 110, respiratory rate 21, blood pressure 154/85 White blood cell count 22.8, hemoglobin 11.0 Odium of 108 that corrects 241, potassium of 8.3, chloride of 77, bicarb of less than 7, BUN of 90, creatinine 5.40, glucose 1487 Beta hydroxybutyrate is 8.00 pH is 7.06 Greater than 500 glucosuria Cefepime Vanco and Flagyl started Covid negative 3 L LR bolus CT head shows no evidence of acute vascular territory infarct or other acute intracranial abnormality Critical care consulted and reports that they will consult virtually EKG equals prolonged QT peaked T waves 1 amp of calcium and 1 amp of  bicarb given Bicarb drip started Mental status improved with these treatments Blood cultures and urine cultures collected  Admission requested for further management of electrolyte derangements and DKA   Review of systems:    In addition to the HPI above,  Review of Systems  Constitutional: Positive for malaise/fatigue. Negative for chills and fever.  HENT: Negative for congestion, sinus pain and sore throat.   Eyes: Negative for blurred vision and double vision.  Respiratory: Negative for cough, shortness of breath and wheezing.   Cardiovascular: Negative for chest pain, palpitations, orthopnea and leg swelling.  Gastrointestinal: Positive for nausea and vomiting. Negative for abdominal pain and diarrhea.  Genitourinary: Negative for dysuria, frequency and urgency.  Musculoskeletal: Negative for myalgias.  Skin: Negative for rash.  Neurological: Negative for dizziness, weakness and headaches.    Endo/Heme/Allergies: Negative for polydipsia.  Psychiatric/Behavioral: Negative for substance abuse.    All other systems reviewed and are negative.    Past History of the following :    Past Medical History:  Diagnosis Date  . Diabetes mellitus    lantus/novolog  . Hyperlipidemia   . Hypertension   . Marijuana abuse    occaisionally  . Noncompliance with medication regimen   . Stroke ([place])   . Tobacco abuse    5/day      Past Surgical History:  Procedure Laterality Date  . EYE SURGERY        Social History:      Social History   Tobacco Use  . Smoking status: Current Every Day Smoker    Packs/day: 0.25    Years: 8.00    Pack years: 2.00    Types: Cigarettes  . Smokeless tobacco: Never Used  . Tobacco comment: smoke 3 to 4 per day  Substance Use Topics  . Alcohol use: No       Family History :     Family History  Problem Relation Age of Onset  . Diabetes Father   . Kidney failure Father        last 4 mnths of life  . Hypertension Mother   . Diabetes Maternal Grandmother   . Pancreatic cancer Maternal Grandfather   . Diabetes Paternal Grandfather       Home Medications:   Prior to Admission medications   Medication Sig Start Date End Date Taking? Authorizing Provider  acetaminophen (TYLENOL) 325 MG tablet Take 1-2 tablets (325-650 mg total) by mouth every 4 (four) hours as needed for mild pain. 03/07/18  Yes Love, Ivan Anchors, PA-C  amLODipine (NORVASC) 10 MG tablet Take 1 tablet (10 mg total) by mouth daily. 03/08/18  Yes Love, Ivan Anchors, PA-C  aspirin 325 MG EC tablet TAKE 1 TABLET BY MOUTH EVERY DAY 04/21/18  Yes Meredith Staggers, MD  atorvastatin (LIPITOR) 80 MG tablet Take 1 tablet (80 mg total) by mouth daily at 6 PM. 03/07/18  Yes Love, Ivan Anchors, PA-C  clopidogrel (PLAVIX) 75 MG tablet Take 75 mg by mouth daily. 03/27/18  Yes [provider]  glucagon 1 MG injection Inject 1 mg into the vein once as needed for up to 1 dose. 08/29/18  Yes  Shamleffer, Melanie Crazier, MD  insulin aspart (NOVOLOG) 100 UNIT/ML FlexPen Inject per sliding scale before mealsI: Blood sugars between 200-250 Take 2 units, 250-300 take 3 units, Over 300 Take 4 units 03/20/20  Yes Elayne Snare, MD  insulin degludec (TRESIBA FLEXTOUCH) 100 UNIT/ML FlexTouch Pen Inject up to 10 units once daily as directed 07/12/20  Yes Elayne Snare, MD  lisinopril (PRINIVIL,ZESTRIL) 40 MG tablet Take 1 tablet (40 mg total) by mouth daily. 07/19/18  Yes Shamleffer, Melanie Crazier, MD  Vitamin D, Ergocalciferol, (DRISDOL) 1.25 MG (50000 UNIT) CAPS capsule Take 50,000 Units by mouth once a week. 06/06/20  Yes [provider]  Continuous Blood Gluc Sensor (FREESTYLE LIBRE 2 SENSOR) MISC 2 Devices by Does not apply route every 14 (fourteen) days. 04/30/20   Elayne Snare, MD  Insulin Pen Needle (PEN NEEDLES 3/16") 31G X 5 MM MISC Four times day 07/06/18   Shamleffer, Melanie Crazier, MD  ONETOUCH ULTRA test strip USE TO TEST BLOOD SUGAR 4 TIMES A DAY (INSURANCE WILL ONLY PAY FOR HIM TO TEST 3 TIMES A DAY) 06/18/20   Elayne Snare, MD     Allergies:     Allergies  Allergen Reactions  . Fructose Other (See Comments)    Increase of blood sugar      Physical Exam:   Vitals  Blood pressure (!) 168/90, pulse (!) 114, temperature (!) 97.5 F (36.4 C), temperature source Axillary, resp. rate 17, height 5\' 8"  (1.727 m), weight 71.3 kg, SpO2 100 %.  1.  General: Lying right lateral decubitus in bed, ill-appearing  2. Psychiatric: Delayed responses to questions, oriented x3, somnolent  3. Neurologic: Moves all 4 extremities voluntarily, equal sensation in the lateral extremities, delayed responses to questions, no acute focal deficit on limited exam  4. HEENMT:  Head is atraumatic, normocephalic, pupils are reactive to light, trachea is midline, mucous membranes are dry, neck is supple  5. Respiratory : Lungs are clear to auscultation bilaterally  6. Cardiovascular  : Heart rate is tachycardic, rhythm is regular, no murmurs rubs or gallops  7. Gastrointestinal:  Abdomen is flat, nondistended, nontender to palpation  8. Skin:  No acute lesions on limited skin exam  9.Musculoskeletal:  No acute deformity or peripheral edema    Data Review:    CBC Recent Labs  Lab 07/14/20 1323 07/14/20 1324 07/14/20 1345  WBC  --  22.8*  --   HGB 13.3 11.0* 13.3  HCT 39.0 37.8* 39.0  PLT  --  372  --   MCV  --  101.9*  --   MCH  --  29.6  --   MCHC  --  29.1*  --   RDW  --  13.3  --   LYMPHSABS  --  0.7  --   MONOABS  --  0.8  --   EOSABS  --  0.0  --   BASOSABS  --  0.1  --    ------------------------------------------------------------------------------------------------------------------  Results for orders placed or performed during the hospital encounter of 07/14/20 (from the past 48 hour(s))  CBG monitoring, ED     Status: Abnormal   Collection Time: 07/14/20  1:18 PM  Result Value Ref Range   Glucose-Capillary >600 (HH) 70 - 99 mg/dL    Comment: Glucose reference range applies only to samples taken after fasting for at least 8 hours.  Urine rapid drug screen (hosp performed)     Status: Abnormal   Collection Time: 07/14/20  1:21 PM  Result Value Ref Range   Opiates NONE DETECTED NONE DETECTED   Cocaine NONE DETECTED NONE DETECTED   Benzodiazepines NONE DETECTED NONE DETECTED   Amphetamines NONE DETECTED NONE DETECTED   Tetrahydrocannabinol POSITIVE (A) NONE DETECTED   Barbiturates NONE DETECTED NONE DETECTED    Comment: (NOTE) DRUG SCREEN FOR MEDICAL PURPOSES ONLY.  IF CONFIRMATION IS  NEEDED FOR ANY PURPOSE, NOTIFY LAB WITHIN 5 DAYS.  LOWEST DETECTABLE LIMITS FOR URINE DRUG SCREEN Drug Class                     Cutoff (ng/mL) Amphetamine and metabolites    1000 Barbiturate and metabolites    200 Benzodiazepine                 841 Tricyclics and metabolites     300 Opiates and metabolites        300 Cocaine and metabolites         300 THC                            50 Performed at [facility], [address], [place], [place] [address]   Beta-hydroxybutyric acid     Status: Abnormal   Collection Time: 07/14/20  1:22 PM  Result Value Ref Range   Beta-Hydroxybutyric Acid >8.00 (H) 0.05 - 0.27 mmol/L    Comment: RESULTS CONFIRMED BY MANUAL DILUTION Performed at [facility], [address], [place], [place] [address]   Lactic acid, plasma     Status: Abnormal   Collection Time: 07/14/20  1:22 PM  Result Value Ref Range   Lactic Acid, Venous 6.4 (HH) 0.5 - 1.9 mmol/L    Comment: CRITICAL RESULT CALLED TO, READ BACK BY AND VERIFIED WITH: CRAWFORD,RN AT 1411 ON 10.25.21 BY ISLEY,B Performed at [facility], [address], [place], [place] [address]   Ethanol     Status: None   Collection Time: 07/14/20  1:22 PM  Result Value Ref Range   Alcohol, Ethyl (B) <10 <10 mg/dL    Comment: (NOTE) Lowest detectable limit for serum alcohol is 10 mg/dL.  For medical purposes only. Performed at [facility], [address], [place], [place] [address]   I-stat chem 8, ED (not at [facility] or [facility])     Status: Abnormal   Collection Time: 07/14/20  1:23 PM  Result Value Ref Range   Sodium 108 (LL) 135 - 145 mmol/L   Potassium 8.1 (HH) 3.5 - 5.1 mmol/L   Chloride 78 (L) 98 - 111 mmol/L   BUN 82 (H) 6 - 20 mg/dL   Creatinine, Ser 5.40 (H) 0.61 - 1.24 mg/dL   Glucose, Bld >700 (HH) 70 - 99 mg/dL    Comment: Glucose reference range applies only to samples taken after fasting for at least 8 hours.   Calcium, Ion 0.82 (LL) 1.15 - 1.40 mmol/L   TCO2 6 (L) 22 - 32 mmol/L   Hemoglobin 13.3 13.0 - 17.0 g/dL   HCT 39.0 39 - 52 %  Comprehensive metabolic panel     Status: Abnormal   Collection Time: 07/14/20  1:24 PM  Result Value Ref Range   Sodium 110 (LL) 135 - 145 mmol/L    Comment: CRITICAL RESULT CALLED TO, READ BACK BY AND VERIFIED WITH: GOINGS,D AT 1414 ON 10.25.21 BY ISLEY,B    Potassium 8.3 (HH) 3.5 - 5.1 mmol/L     Comment: CRITICAL RESULT CALLED TO, READ BACK BY AND VERIFIED WITH: GOINGS,D AT 1414 ON 10.25.21 BY ISLEY,B    Chloride 69 (L) 98 - 111 mmol/L   CO2 <7 (L) 22 - 32 mmol/L   Glucose, Bld 1,487 (HH) 70 - 99 mg/dL    Comment: Glucose reference range applies only to samples taken after fasting for at least 8 hours. RESULTS CONFIRMED BY  MANUAL DILUTION CRITICAL RESULT CALLED TO, READ BACK BY AND VERIFIED WITH: GOINGS,D AT 1423 ON 10.25.21 BY ISLEY,B    BUN 88 (H) 6 - 20 mg/dL   Creatinine, Ser 5.50 (H) 0.61 - 1.24 mg/dL   Calcium 7.9 (L) 8.9 - 10.3 mg/dL   Total Protein 7.5 6.5 - 8.1 g/dL   Albumin 4.0 3.5 - 5.0 g/dL   AST 16 15 - 41 U/L   ALT 17 0 - 44 U/L   Alkaline Phosphatase 110 38 - 126 U/L   Total Bilirubin 2.3 (H) 0.3 - 1.2 mg/dL   GFR, Estimated 13 (L) >60 mL/min    Comment: (NOTE) Calculated using the CKD-EPI Creatinine Equation (2021) Performed at [facility], [address], [place], [place] [address]   Lipase, blood     Status: None   Collection Time: 07/14/20  1:24 PM  Result Value Ref Range   Lipase 28 11 - 51 U/L    Comment: Performed at [facility], [address] [place], [place] [address]  CBC with Differential     Status: Abnormal   Collection Time: 07/14/20  1:24 PM  Result Value Ref Range   WBC 22.8 (H) 4.0 - 10.5 K/uL   RBC 3.71 (L) 4.22 - 5.81 MIL/uL   Hemoglobin 11.0 (L) 13.0 - 17.0 g/dL   HCT 37.8 (L) 39 - 52 %   MCV 101.9 (H) 80.0 - 100.0 fL   MCH 29.6 26.0 - 34.0 pg   MCHC 29.1 (L) 30.0 - 36.0 g/dL   RDW 13.3 11.5 - 15.5 %   Platelets 372 150 - 400 K/uL   nRBC 0.0 0.0 - 0.2 %   Neutrophils Relative % 92 %   Neutro Abs 21.1 (H) 1.7 - 7.7 K/uL   Lymphocytes Relative 3 %   Lymphs Abs 0.7 0.7 - 4.0 K/uL   Monocytes Relative 4 %   Monocytes Absolute 0.8 0.1 - 1.0 K/uL   Eosinophils Relative 0 %   Eosinophils Absolute 0.0 0.0 - 0.5 K/uL   Basophils Relative 0 %   Basophils Absolute 0.1 0.0 - 0.1 K/uL   Immature Granulocytes 1 %   Abs Immature  Granulocytes 0.11 (H) 0.00 - 0.07 K/uL    Comment: Performed at [facility], [address], [place], [place] [address]  Respiratory Panel by RT PCR (Flu A&B, Covid) - Nasopharyngeal Swab     Status: None   Collection Time: 07/14/20  1:24 PM   Specimen: Nasopharyngeal Swab  Result Value Ref Range   SARS Coronavirus 2 by RT PCR NEGATIVE NEGATIVE    Comment: (NOTE) SARS-CoV-2 target nucleic acids are NOT DETECTED.  The SARS-CoV-2 RNA is generally detectable in upper respiratoy specimens during the acute phase of infection. The lowest concentration of SARS-CoV-2 viral copies this assay can detect is 131 copies/mL. A negative result does not preclude SARS-Cov-2 infection and should not be used as the sole basis for treatment or other patient management decisions. A negative result may occur with  improper specimen collection/handling, submission of specimen other than nasopharyngeal swab, presence of viral mutation(s) within the areas targeted by this assay, and inadequate number of viral copies (<131 copies/mL). A negative result must be combined with clinical observations, patient history, and epidemiological information. The expected result is Negative.  Fact Sheet for Patients:  PinkCheek.be  Fact Sheet for Healthcare Providers:  GravelBags.it  This test is no t yet approved or cleared by the [place] and  has been authorized for  detection and/or diagnosis of SARS-CoV-2 by FDA under an Emergency Use Authorization (EUA). This EUA will remain  in effect (meaning this test can be used) for the duration of the COVID-19 declaration under Section 564(b)(1) of the Act, 21 U.S.C. section 360bbb-3(b)(1), unless the authorization is terminated or revoked sooner.     Influenza A by PCR NEGATIVE NEGATIVE   Influenza B by PCR NEGATIVE NEGATIVE    Comment: (NOTE) The Xpert Xpress SARS-CoV-2/FLU/RSV assay is intended as an  aid in  the diagnosis of influenza from Nasopharyngeal swab specimens and  should not be used as a sole basis for treatment. Nasal washings and  aspirates are unacceptable for Xpert Xpress SARS-CoV-2/FLU/RSV  testing.  Fact Sheet for Patients: PinkCheek.be  Fact Sheet for Healthcare Providers: GravelBags.it  This test is not yet approved or cleared by the [place] FDA and  has been authorized for detection and/or diagnosis of SARS-CoV-2 by  FDA under an Emergency Use Authorization (EUA). This EUA will remain  in effect (meaning this test can be used) for the duration of the  Covid-19 declaration under Section 564(b)(1) of the Act, 21  U.S.C. section 360bbb-3(b)(1), unless the authorization is  terminated or revoked. Performed at [facility], [address] [place], [place] [address]   Magnesium     Status: Abnormal   Collection Time: 07/14/20  1:24 PM  Result Value Ref Range   Magnesium 2.5 (H) 1.7 - 2.4 mg/dL    Comment: Performed at [facility], [address], [place], [place] [address]  Urinalysis, Complete w Microscopic Urine, Catheterized     Status: Abnormal   Collection Time: 07/14/20  1:41 PM  Result Value Ref Range   Color, Urine YELLOW YELLOW   APPearance HAZY (A) CLEAR   Specific Gravity, Urine 1.017 1.005 - 1.030   pH 5.0 5.0 - 8.0   Glucose, UA >=500 (A) NEGATIVE mg/dL   Hgb urine dipstick NEGATIVE NEGATIVE   Bilirubin Urine NEGATIVE NEGATIVE   Ketones, ur 20 (A) NEGATIVE mg/dL   Protein, ur 100 (A) NEGATIVE mg/dL   Nitrite NEGATIVE NEGATIVE   Leukocytes,Ua NEGATIVE NEGATIVE   RBC / HPF 0-5 0 - 5 RBC/hpf   WBC, UA 0-5 0 - 5 WBC/hpf   Bacteria, UA RARE (A) NONE SEEN   Squamous Epithelial / LPF 0-5 0 - 5   Amorphous Crystal PRESENT    Ca Oxalate Crys, UA PRESENT     Comment: Performed at [facility], [address] [place], [place] [address]  I-stat chem 8, ed     Status: Abnormal   Collection  Time: 07/14/20  1:45 PM  Result Value Ref Range   Sodium 108 (LL) 135 - 145 mmol/L   Potassium 8.3 (HH) 3.5 - 5.1 mmol/L   Chloride 77 (L) 98 - 111 mmol/L   BUN 90 (H) 6 - 20 mg/dL   Creatinine, Ser 5.40 (H) 0.61 - 1.24 mg/dL   Glucose, Bld >700 (HH) 70 - 99 mg/dL    Comment: Glucose reference range applies only to samples taken after fasting for at least 8 hours.   Calcium, Ion 0.83 (LL) 1.15 - 1.40 mmol/L   TCO2 6 (L) 22 - 32 mmol/L   Hemoglobin 13.3 13.0 - 17.0 g/dL   HCT 39.0 39 - 52 %   Comment NOTIFIED PHYSICIAN   Blood gas, arterial (at [facility])     Status: Abnormal   Collection Time: 07/14/20  1:50 PM  Result Value Ref Range  FIO2 21.00    pH, Arterial 7.061 (LL) 7.35 - 7.45    Comment: CRITICAL RESULT CALLED TO, READ BACK BY AND VERIFIED WITH: GOINGS,D AT 1426 ON 10.25..21 BY ISLEY,B    pCO2 arterial 18.4 (LL) 32 - 48 mmHg    Comment: CRITICAL RESULT CALLED TO, READ BACK BY AND VERIFIED WITH: GOINGS,D AT 1426 ON 10.25.21 BY ISLEY,B    pO2, Arterial 128 (H) 83 - 108 mmHg   Bicarbonate 7.4 (L) 20.0 - 28.0 mmol/L   Acid-base deficit 23.4 (H) 0.0 - 2.0 mmol/L   O2 Saturation 96.5 %   Patient temperature 37.0    Allens test (pass/fail) PASS PASS    Comment: Performed at [facility], [address], [place], [place] [address]  Ammonia     Status: Abnormal   Collection Time: 07/14/20  2:02 PM  Result Value Ref Range   Ammonia 36 (H) 9 - 35 umol/L    Comment: Performed at [facility], [address] [place], [place] [address]  Culture, blood (routine x 2)     Status: None (Preliminary result)   Collection Time: 07/14/20  2:03 PM   Specimen: BLOOD RIGHT HAND  Result Value Ref Range   Specimen Description      BLOOD RIGHT HAND BOTTLES DRAWN AEROBIC AND ANAEROBIC   Special Requests      Blood Culture adequate volume Performed at [facility], [address], [place], [place] [address]    Culture PENDING    Report Status PENDING   Culture, blood (routine x 2)      Status: None (Preliminary result)   Collection Time: 07/14/20  2:03 PM   Specimen: BLOOD LEFT HAND  Result Value Ref Range   Specimen Description      BLOOD LEFT HAND BOTTLES DRAWN AEROBIC AND ANAEROBIC   Special Requests      Blood Culture adequate volume Performed at [facility], [address] [place], [place] [address]    Culture PENDING    Report Status PENDING   Lactic acid, plasma     Status: Abnormal   Collection Time: 07/14/20  3:10 PM  Result Value Ref Range   Lactic Acid, Venous 6.4 (HH) 0.5 - 1.9 mmol/L    Comment: CRITICAL RESULT CALLED TO, READ BACK BY AND VERIFIED WITH: CRAWFORD.RN AT 5681 ON 10.25.21 BY ISLEY,B Performed at [facility], [address] [place], [place] [address]   CBG monitoring, ED     Status: Abnormal   Collection Time: 07/14/20  3:47 PM  Result Value Ref Range   Glucose-Capillary >600 (HH) 70 - 99 mg/dL    Comment: Glucose reference range applies only to samples taken after fasting for at least 8 hours.  Basic metabolic panel     Status: Abnormal   Collection Time: 07/14/20  3:58 PM  Result Value Ref Range   Sodium 116 (LL) 135 - 145 mmol/L    Comment: CRITICAL RESULT CALLED TO, READ BACK BY AND VERIFIED WITH: GOINS,D ON 07/14/20 AT 1700 BY LOY,C    Potassium 5.9 (H) 3.5 - 5.1 mmol/L    Comment: DELTA CHECK NOTED   Chloride 75 (L) 98 - 111 mmol/L   CO2 9 (L) 22 - 32 mmol/L   Glucose, Bld 1,350 (HH) 70 - 99 mg/dL    Comment: CRITICAL RESULT CALLED TO, READ BACK BY AND VERIFIED WITH: GOINS,D ON 07/14/20 AT 1700 BY LOY,C RESULTS CONFIRMED BY MANUAL DILUTION    BUN 87 (H) 6 - 20 mg/dL  Creatinine, Ser 5.53 (H) 0.61 - 1.24 mg/dL   Calcium 7.6 (L) 8.9 - 10.3 mg/dL   GFR, Estimated 13 (L) >60 mL/min    Comment: (NOTE) Calculated using the CKD-EPI Creatinine Equation (2021)    Anion gap >20 (H) 5 - 15    Comment: Performed at [facility], [address] [place], [place] [address]  CBG monitoring, ED     Status: Abnormal   Collection  Time: 07/14/20  4:16 PM  Result Value Ref Range   Glucose-Capillary >600 (HH) 70 - 99 mg/dL    Comment: Glucose reference range applies only to samples taken after fasting for at least 8 hours.  CBG monitoring, ED     Status: Abnormal   Collection Time: 07/14/20  4:54 PM  Result Value Ref Range   Glucose-Capillary >600 (HH) 70 - 99 mg/dL    Comment: Glucose reference range applies only to samples taken after fasting for at least 8 hours.  CBG monitoring, ED     Status: Abnormal   Collection Time: 07/14/20  5:38 PM  Result Value Ref Range   Glucose-Capillary >600 (HH) 70 - 99 mg/dL    Comment: Glucose reference range applies only to samples taken after fasting for at least 8 hours.  Glucose, capillary     Status: Abnormal   Collection Time: 07/14/20  6:29 PM  Result Value Ref Range   Glucose-Capillary >600 (HH) 70 - 99 mg/dL    Comment: Glucose reference range applies only to samples taken after fasting for at least 8 hours.    Chemistries  Recent Labs  Lab 07/14/20 1323 07/14/20 1324 07/14/20 1345 07/14/20 1558  NA 108* 110* 108* 116*  K 8.1* 8.3* 8.3* 5.9*  CL 78* 69* 77* 75*  CO2  --  <7*  --  9*  GLUCOSE >700* 1,487* >700* 1,350*  BUN 82* 88* 90* 87*  CREATININE 5.40* 5.50* 5.40* 5.53*  CALCIUM  --  7.9*  --  7.6*  MG  --  2.5*  --   --   AST  --  16  --   --   ALT  --  17  --   --   ALKPHOS  --  110  --   --   BILITOT  --  2.3*  --   --    ------------------------------------------------------------------------------------------------------------------  ------------------------------------------------------------------------------------------------------------------ GFR: Estimated Creatinine Clearance: 18.4 mL/min (A) (by C-G formula based on SCr of 5.53 mg/dL (H)). Liver Function Tests: Recent Labs  Lab 07/14/20 1324  AST 16  ALT 17  ALKPHOS 110  BILITOT 2.3*  PROT 7.5  ALBUMIN 4.0   Recent Labs  Lab 07/14/20 1324  LIPASE 28   Recent Labs  Lab  07/14/20 1402  AMMONIA 36*   Coagulation Profile: No results for input(s): INR, PROTIME in the last 168 hours. Cardiac Enzymes: No results for input(s): CKTOTAL, CKMB, CKMBINDEX, TROPONINI in the last 168 hours. BNP (last 3 results) No results for input(s): PROBNP in the last 8760 hours. HbA1C: No results for input(s): HGBA1C in the last 72 hours. CBG: Recent Labs  Lab 07/14/20 1547 07/14/20 1616 07/14/20 1654 07/14/20 1738 07/14/20 1829  GLUCAP >600* >600* >600* >600* >600*   Lipid Profile: No results for input(s): CHOL, HDL, LDLCALC, TRIG, CHOLHDL, LDLDIRECT in the last 72 hours. Thyroid Function Tests: No results for input(s): TSH, T4TOTAL, FREET4, T3FREE, THYROIDAB in the last 72 hours. Anemia Panel: No results for input(s): VITAMINB12, FOLATE, FERRITIN, TIBC, IRON, RETICCTPCT in the last 72  hours.  --------------------------------------------------------------------------------------------------------------- Urine analysis:    Component Value Date/Time   COLORURINE YELLOW 07/14/2020 1341   APPEARANCEUR HAZY (A) 07/14/2020 1341   LABSPEC 1.017 07/14/2020 1341   PHURINE 5.0 07/14/2020 1341   GLUCOSEU >=500 (A) 07/14/2020 1341   HGBUR NEGATIVE 07/14/2020 1341   BILIRUBINUR NEGATIVE 07/14/2020 1341   KETONESUR 20 (A) 07/14/2020 1341   PROTEINUR 100 (A) 07/14/2020 1341   UROBILINOGEN 0.2 06/07/2015 1815   NITRITE NEGATIVE 07/14/2020 1341   LEUKOCYTESUR NEGATIVE 07/14/2020 1341      Imaging Results:    CT Head Wo Contrast  Result Date: 07/14/2020 CLINICAL DATA:  Suspected DKA, altered mental status, history of MCA infarct EXAM: CT HEAD WITHOUT CONTRAST TECHNIQUE: Contiguous axial images were obtained from the base of the skull through the vertex without intravenous contrast. 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 No clear CT evident areas of acute vascular territory infarct are present. More patchy areas of white matter hypoattenuation are most compatible with microvascular angiopathy versus other nonspecific chronic white matter disease. No evidence of acute infarction, hemorrhage, hydrocephalus, extra-axial collection, visible mass lesion or mass effect. Vascular: Age advanced atherosclerotic calcifications of the intracranial carotids and vertebral arteries. No worrisome acute hyperdense vessel. Skull: Mild edematous changes of the scalp. No scalp swelling or hematoma. No calvarial fracture or acute or suspicious osseous abnormality. Sinuses/Orbits: Chronic left mastoid and middle ear effusion with hyperostotic changes. Additional focal thickening in the left epitympanum is nonspecific. No clear osseous erosion or destructive changes of the ossicular chain are evident within the limitations of this exam. Other: None IMPRESSION: 1. No CT evidence of acute vascular territory infarct or other acute intracranial abnormality. 2. 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 3. More patchy areas of white matter hypoattenuation are most compatible with microvascular angiopathy in this patient with history of age advanced atherosclerosis and diabetes versus other nonspecific chronic white matter disease. 4. Chronic left mastoid and middle ear effusion with hyperostotic changes. Additional focal thickening in the left epitympanum is nonspecific. Cholesteatoma not fully excluded. Could correlate with nonemergent thin-section temporal bone imaging as warranted. No clear osseous erosion or destructive changes of the ossicular chain are evident within the limitations of this exam. Electronically Signed   By: Lovena Le M.D.   On: 07/14/2020 16:56     DG Chest Portable 1 View  Result Date: 07/14/2020 CLINICAL DATA:  Altered mental status EXAM: PORTABLE CHEST 1 VIEW COMPARISON:  02/16/2018 FINDINGS: The heart size and mediastinal contours are within normal limits. Both lungs are clear. The visualized skeletal structures are unremarkable. IMPRESSION: No active disease. Electronically Signed   By: Davina Poke D.O.   On: 07/14/2020 14:11       Assessment & Plan:    Active Problems:   DKA (diabetic ketoacidosis) ([place])   1. Metabolic encephalopathy 1. Secondary to DKA 2. CT head showed no CT evidence of acute vascular territory infarct or other acute intracranial abnormality.  Chronic changes are listed in CT report 3. Neuro exam is nonfocal 4. Patient continues to be somnolent 5. Continue to monitor 2. DKA in type I diabetic 1. pH 7.06, bicarb less than 7, glucose 1487, gap could not be calculated as  bicarb could not be detected 2. Greater than 500 glucosuria, 20 ketones in urine 3. Beta hydroxybutyrate greater than 8 4. Amp of bicarbonate amp of calcium given upon arrival 5. 10 units regular insulin given, and Endo tool started 6. N.p.o. 7. 3 L LR given 8. Continue fluids at 250 mL's per hour 9. Continue to monitor 3. Dehydration 1. Secondary to DKA 2. See plan above 4. Hypothermia 1. Continue heating blanket 2. Continue to check rectal temp 5. Pseudohyponatremia 1. Initial sodium reading 108, correction for glucose equals 141 2. Continue to monitor 6. Hyperkalemia 1. Initial potassium is 8.3 2. On first Endo tool BMP check potassium already improving to 5 3. EKG did show peak T waves, calcium given in ED 4. Repeat EKG shows improvement 5. Continue to monitor on telemetry 7. AKI 1. Creatinine baseline seems to be in the twos 2. Today creatinine is 5.4 3. Secondary to dehydration which is secondary to DKA 4. 3 L LR given in ED 5. Continue LR at 250 mL's per hour 6. Hold lisinopril 7. Continue to trend  BMP 8. Lactic acidosis 1. Lactic acid 6.4x2 2. Boluses as above 3. Continue IV fluids 250 mL's per hour 4. Continue to trend 9. Leukocytosis 1. Of 22.8, of 95.1, heart rate 110, respiratory rate 21 2. Likely all of the above is secondary to DKA, however sepsis cannot be ruled out at this time 3. Lactic acid is also elevated at 6.4 4. Blood cultures and urine cultures pending 5. Chest x-ray shows no active disease 6. Procalcitonin pending 7. Continue cefepime and vancomycin while extra data is collected 10. Elevated T bili 1. T bili elevated to 2.3 from 0.4 baseline 2. IV boluses as described above 3. Trend with CMP in the a.m. 11. Hypocalcemia 1. Calcium at 7.6, albumin 4.0 2. Amp of calcium given in ED 3. Give another gram of calcium 4. Continue to trend 12. Prolonged QT 1. Secondary to electrolyte derangements 2. Avoid QT prolonging agents when possible 3. Monitor on telemetry\ 4. Correct electrolytes as above    DVT Prophylaxis-   Heparin- SCDs  AM Labs Ordered, also please review Full Orders  Family Communication: No family at bedside, no family can be reached by phone  Code Status: Full  Admission status: Inpatient :The appropriate admission status for this patient is INPATIENT. Inpatient status is judged to be reasonable and necessary in order to provide the required intensity of service to ensure the patient's safety. The patient's presenting symptoms, physical exam findings, and initial radiographic and laboratory data in the context of their chronic comorbidities is felt to place them at high risk for further clinical deterioration. Furthermore, it is not anticipated that the patient will be medically stable for discharge from the hospital within 2 midnights of admission. The following factors support the admission status of inpatient.     The patient's presenting symptoms include altered mental status The worrisome physical exam findings include drowsy,  dehydrated The initial radiographic and laboratory data are worrisome because of glucose 1487, bicarb less than 7, pH 7.06 The chronic co-morbidities include type 1 diabetes mellitus       * I certify that at the point of admission it is my clinical judgment that the patient will require inpatient hospital care spanning beyond 2 midnights from the point of admission due to high intensity of service, high risk for further deterioration and high frequency of surveillance required.*  Time spent in minutes : [place]  DO

## 2020-07-14 NOTE — Telephone Encounter (Signed)
Please check with the pharmacy and his mother also, he normally takes 7-8 units [name] daily and the prescription is for up to 10 units.  I do not understand why they cannot get a refill.  Each pen can be used for at least 6 weeks at a time

## 2020-07-14 NOTE — ED Triage Notes (Addendum)
Pt brought to ED via private vehicle. Pt family states he has been altered since this am. Pt is diabetic and CBG has been over 600 for 3 days. Pt will respond to voice. Pt unable to stand and move from car. Pt had to be lifted by staff from car to stretcher. Per family, pt has not had insulin in about 3 days.

## 2020-07-14 NOTE — ED Notes (Signed)
PA notified sodium bicarb drip mixed with Dextrose 5. Per PA, go ahead and administer.

## 2020-07-14 NOTE — ED Provider Notes (Signed)
[facility] EMERGENCY DEPARTMENT Provider Note   CSN: [id number] Arrival date & time: 07/14/20  1310     History Chief Complaint  Patient presents with  . Altered Mental Status    Nathaniel [name] is a 34 y.o. male with a past medical history of diabetes, hypertension, hyperlipidemia, marijuana use, stroke of the left MCA in 2019 who presents today for evaluation of altered mental status.  Level 5 caveat for altered mental status  Chart review shows that there have been multiple encounters with endocrinology office and drugstore recently attempting to get patient's insulin.  According to triage notes patient has been out of his insulin for 3 days.  Triage notes also report the patient has been "laying around" for the past 3 days.  After patient's arrival I attempted to contact family x3  at numbers listed in the chart without answer.   HPI     Past Medical History:  Diagnosis Date  . Diabetes mellitus    lantus/novolog  . Hyperlipidemia   . Hypertension   . Marijuana abuse    occaisionally  . Noncompliance with medication regimen   . Stroke ([place])   . Tobacco abuse    5/day    Patient Active Problem List   Diagnosis Date Noted  . 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 Stable treated proliferative diabetic retinopathy of left eye with macular edema determined by examination associated with type 1 diabetes mellitus ([place]) 01/28/2020  . Pseudophakia 01/28/2020  . Stage 3 chronic kidney disease ([place])   . Hypoglycemia   . Neurosyphilis   . Mixed hyperlipidemia   . Acute ischemic left MCA stroke ([place]) 02/22/2018  . Abnormality of gait following cerebrovascular accident (CVA)   . Type 1 diabetes mellitus with peripheral circulatory complications ([place])   . Essential hypertension   . Dyslipidemia   . Syphilis   . DKA (diabetic ketoacidoses) 02/16/2018  .  CVA (cerebral vascular accident) ([place]) 02/16/2018  . Acute renal failure (ARF) ([place]) 10/21/2016  . Diabetic ketoacidosis without coma associated with type 1 diabetes mellitus ([place])   . Hyperbilirubinemia   . Hyponatremia 04/15/2016  . DM (diabetes mellitus) type 1, uncontrolled, with ketoacidosis ([place]) 04/15/2016  . Hyperglycemia 04/15/2016  . AKI (acute kidney injury) ([place]) 01/04/2016  . Malignant hypertension 01/04/2016  . Noncompliance with medications 01/04/2016  . Intractable nausea and vomiting 04/01/2015  . Gastroparesis 04/01/2015  . Type 1 diabetes mellitus with complication ([place]) 08/65/7846  . Chronic hypertension   . Nausea with vomiting   . Abscess, gluteal, right 06/21/2014  . Nausea and vomiting 04/08/2013  . Hyperglycemia without ketosis 04/08/2013  . Essential hypertension, benign 04/08/2013  . Hypercalcemia 07/29/2011  . Vomiting 07/28/2011  . Leukocytosis 07/28/2011  . Hypokalemia 07/28/2011  . DKA, type 1 ([place]) 07/27/2011  . Dehydration 07/27/2011  . Smoker 07/27/2011  . Marijuana abuse 07/27/2011    Past Surgical History:  Procedure Laterality Date  . EYE SURGERY         Family History  Problem Relation Age of Onset  . Diabetes Father   . Kidney failure Father        last 4 mnths of life  . Hypertension Mother   . Diabetes Maternal Grandmother   . Pancreatic cancer Maternal Grandfather   . Diabetes Paternal Grandfather     Social History   Tobacco Use  . Smoking status: Current Every Day Smoker  Packs/day: 0.25    Years: 8.00    Pack years: 2.00    Types: Cigarettes  . Smokeless tobacco: Never Used  . Tobacco comment: smoke 3 to 4 per day  Vaping Use  . Vaping Use: Never used  Substance Use Topics  . Alcohol use: No  . Drug use: Yes    Types: Marijuana    Comment: last use yesterday, every other days     Home Medications Prior to Admission medications   Medication Sig Start Date End Date Taking? Authorizing Provider    acetaminophen (TYLENOL) 325 MG tablet Take 1-2 tablets (325-650 mg total) by mouth every 4 (four) hours as needed for mild pain. 03/07/18   Love, Ivan Anchors, PA-C  amLODipine (NORVASC) 10 MG tablet Take 1 tablet (10 mg total) by mouth daily. 03/08/18   Love, Ivan Anchors, PA-C  aspirin 325 MG EC tablet TAKE 1 TABLET BY MOUTH EVERY DAY 04/21/18   Meredith Staggers, MD  atorvastatin (LIPITOR) 80 MG tablet Take 1 tablet (80 mg total) by mouth daily at 6 PM. 03/07/18   Love, Ivan Anchors, PA-C  clopidogrel (PLAVIX) 75 MG tablet Take 75 mg by mouth daily. 03/27/18   [provider]  Continuous Blood Gluc Sensor (FREESTYLE LIBRE 2 SENSOR) MISC 2 Devices by Does not apply route every 14 (fourteen) days. 04/30/20   Elayne Snare, MD  glucagon 1 MG injection Inject 1 mg into the vein once as needed for up to 1 dose. 08/29/18   Shamleffer, Melanie Crazier, MD  insulin aspart (NOVOLOG) 100 UNIT/ML FlexPen Inject per sliding scale before mealsI: Blood sugars between 200-250 Take 2 units, 250-300 take 3 units, Over 300 Take 4 units 03/20/20   Elayne Snare, MD  insulin degludec (TRESIBA FLEXTOUCH) 100 UNIT/ML FlexTouch Pen Inject up to 10 units once daily as directed 07/12/20   Elayne Snare, MD  Insulin Pen Needle (PEN NEEDLES 3/16") 31G X 5 MM MISC Four times day 07/06/18   Shamleffer, Melanie Crazier, MD  lisinopril (PRINIVIL,ZESTRIL) 40 MG tablet Take 1 tablet (40 mg total) by mouth daily. 07/19/18   Shamleffer, Melanie Crazier, MD  ONETOUCH ULTRA test strip USE TO TEST BLOOD SUGAR 4 TIMES A DAY (INSURANCE WILL ONLY PAY FOR HIM TO TEST 3 TIMES A DAY) 06/18/20   Elayne Snare, MD    Allergies    Fructose  Review of Systems   Review of Systems  Unable to perform ROS: Mental status change    Physical Exam Updated Vital Signs BP (!) 132/110   Pulse (!) 111   Temp (!) 95.1 F (35.1 C) (Rectal)   Resp (!) 24   Ht 5\' 8"  (1.727 m)   Wt 59 kg   SpO2 100%   BMI 19.77 kg/m   Physical Exam Vitals and nursing note  reviewed. Exam conducted with a chaperone present.  Constitutional:      General: He is in acute distress.     Appearance: He is well-developed. He is ill-appearing.     Comments: Patient's eyes are open.  He very slowly responds to commands.  He is unable to spontaneously pick his head up.  His eyes are open and he will slowly look at me when I speak to him.  HENT:     Head: Normocephalic and atraumatic.     Mouth/Throat:     Mouth: Mucous membranes are dry.  Eyes:     General: No scleral icterus.       Right eye: No  discharge.        Left eye: No discharge.     Conjunctiva/sclera: Conjunctivae normal.  Cardiovascular:     Rate and Rhythm: Regular rhythm. Tachycardia present.     Heart sounds: Normal heart sounds.  Pulmonary:     Effort: Pulmonary effort is normal. No respiratory distress.     Breath sounds: Normal breath sounds. No stridor.  Abdominal:     General: There is no distension.     Tenderness: There is no abdominal tenderness.  Genitourinary:    Comments: Soft brown stool present in underwear Musculoskeletal:        General: No deformity.     Cervical back: No rigidity.     Right lower leg: No edema.     Left lower leg: No edema.  Skin:    General: Skin is warm and dry.  Neurological:     Mental Status: He is lethargic.     GCS: GCS eye subscore is 3. GCS verbal subscore is 3. GCS motor subscore is 6.     Motor: No tremor.  Psychiatric:     Comments: Unable to assess due to AMS     ED Results / Procedures / Treatments   Labs (all labs ordered are listed, but only abnormal results are displayed) Labs Reviewed  RAPID URINE DRUG SCREEN, HOSP PERFORMED - Abnormal; Notable for the following components:      Result Value   Tetrahydrocannabinol POSITIVE (*)    All other components within normal limits  BETA-HYDROXYBUTYRIC ACID - Abnormal; Notable for the following components:   Beta-Hydroxybutyric Acid >8.00 (*)    All other components within normal limits    LACTIC ACID, PLASMA - Abnormal; Notable for the following components:   Lactic Acid, Venous 6.4 (*)    All other components within normal limits  COMPREHENSIVE METABOLIC PANEL - Abnormal; Notable for the following components:   Sodium 110 (*)    Potassium 8.3 (*)    Chloride 69 (*)    CO2 <7 (*)    Glucose, Bld 1,487 (*)    BUN 88 (*)    Creatinine, Ser 5.50 (*)    Calcium 7.9 (*)    Total Bilirubin 2.3 (*)    GFR, Estimated 13 (*)    All other components within normal limits  CBC WITH DIFFERENTIAL/PLATELET - Abnormal; Notable for the following components:   WBC 22.8 (*)    RBC 3.71 (*)    Hemoglobin 11.0 (*)    HCT 37.8 (*)    MCV 101.9 (*)    MCHC 29.1 (*)    Neutro Abs 21.1 (*)    Abs Immature Granulocytes 0.11 (*)    All other components within normal limits  AMMONIA - Abnormal; Notable for the following components:   Ammonia 36 (*)    All other components within normal limits  MAGNESIUM - Abnormal; Notable for the following components:   Magnesium 2.5 (*)    All other components within normal limits  BLOOD GAS, ARTERIAL - Abnormal; Notable for the following components:   pH, Arterial 7.061 (*)    pCO2 arterial 18.4 (*)    pO2, Arterial 128 (*)    Bicarbonate 7.4 (*)    Acid-base deficit 23.4 (*)    All other components within normal limits  URINALYSIS, COMPLETE (UACMP) WITH MICROSCOPIC - Abnormal; Notable for the following components:   APPearance HAZY (*)    Glucose, UA >=500 (*)    Ketones, ur 20 (*)  Protein, ur 100 (*)    Bacteria, UA RARE (*)    All other components within normal limits  CBG MONITORING, ED - Abnormal; Notable for the following components:   Glucose-Capillary >600 (*)    All other components within normal limits  I-STAT CHEM 8, ED - Abnormal; Notable for the following components:   Sodium 108 (*)    Potassium 8.1 (*)    Chloride 78 (*)    BUN 82 (*)    Creatinine, Ser 5.40 (*)    Glucose, Bld >700 (*)    Calcium, [name] 0.82 (*)     TCO2 6 (*)    All other components within normal limits  I-STAT CHEM 8, ED - Abnormal; Notable for the following components:   Sodium 108 (*)    Potassium 8.3 (*)    Chloride 77 (*)    BUN 90 (*)    Creatinine, Ser 5.40 (*)    Glucose, Bld >700 (*)    Calcium, [name] 0.83 (*)    TCO2 6 (*)    All other components within normal limits  RESPIRATORY PANEL BY RT PCR (FLU A&B, COVID)  CULTURE, BLOOD (ROUTINE X 2)  CULTURE, BLOOD (ROUTINE X 2)  URINE CULTURE  LIPASE, BLOOD  ETHANOL  LACTIC ACID, PLASMA  OSMOLALITY    EKG EKG Interpretation  Date/Time:  Monday July 14 2020 13:16:11 EDT Ventricular Rate:  115 PR Interval:    QRS Duration: 187 QT Interval:  401 QTC Calculation: 555 R Axis:   -94 Text Interpretation: Sinus or ectopic atrial tachycardia Ventricular premature complex Prolonged PR interval Consider dextrocardia Baseline wander in lead(s) V6 Since last tracing TW hypertrophy seen Confirmed by Noemi Chapel [phone]) on 07/14/2020 1:38:51 PM   Radiology DG Chest Portable 1 View  Result Date: 07/14/2020 CLINICAL DATA:  Altered mental status EXAM: PORTABLE CHEST 1 VIEW COMPARISON:  02/16/2018 FINDINGS: The heart size and mediastinal contours are within normal limits. Both lungs are clear. The visualized skeletal structures are unremarkable. IMPRESSION: No active disease. Electronically Signed   By: Davina Poke D.O.   On: 07/14/2020 14:11    Procedures .Critical Care Performed by: Lorin Glass, PA-C Authorized by: Lorin Glass, PA-C   Critical care provider statement:    Critical care time (minutes):  60   Critical care was necessary to treat or prevent imminent or life-threatening deterioration of the following conditions:  Endocrine crisis, sepsis and shock   Critical care was time spent personally by me on the following activities:  Discussions with consultants, evaluation of patient's response to treatment, examination of patient, ordering and  performing treatments and interventions, ordering and review of laboratory studies, ordering and review of radiographic studies, pulse oximetry, re-evaluation of patient's condition, obtaining history from patient or surrogate and review of old charts   (including critical care time)  Medications Ordered in ED Medications  insulin regular, human (MYXREDLIN) 100 units/ 100 mL infusion (5.5 Units/hr Intravenous New Bag/Given 07/14/20 1442)  lactated ringers infusion (has no administration in time range)  dextrose 5 % in lactated ringers infusion (has no administration in time range)  dextrose 50 % solution 0-50 mL (has no administration in time range)  sodium bicarbonate 150 mEq in dextrose 5 % 1,000 mL infusion (has no administration in time range)  ceFEPIme (MAXIPIME) 2 g in sodium chloride 0.9 % 100 mL IVPB (2 g Intravenous New Bag/Given 07/14/20 1443)  metroNIDAZOLE (FLAGYL) IVPB 500 mg (has no administration in time range)  vancomycin (  VANCOCIN) IVPB 1000 mg/200 mL premix (has no administration in time range)  ceFEPIme (MAXIPIME) 2 g in sodium chloride 0.9 % 100 mL IVPB (has no administration in time range)  vancomycin (VANCOREADY) IVPB 750 mg/150 mL (has no administration in time range)  lactated ringers bolus 2,000 mL (2,000 mLs Intravenous New Bag/Given 07/14/20 1326)  calcium gluconate 1 g/ 50 mL sodium chloride IVPB (0 g Intravenous Stopped 07/14/20 1356)  sodium bicarbonate injection 50 mEq (50 mEq Intravenous Given 07/14/20 1442)  insulin aspart (novoLOG) injection 10 Units (10 Units Intravenous Given 07/14/20 1429)  lactated ringers bolus 1,000 mL (1,000 mLs Intravenous New Bag/Given 07/14/20 1437)    ED Course  I have reviewed the triage vital signs and the nursing notes.  Pertinent labs & imaging results that were available during my care of the patient were reviewed by me and considered in my medical decision making (see chart for details).  Clinical Course as of Jul 14 1546   Mon Jul 14, 2020  1408 I went to reevaluate the patient, he had the blood pressure cuff on his lower leg with systolics in the 67E.  This was removed and placed onto his upper arm at which point the systolic was in the 720N.  His mental status is slightly improved, he is able to answer questions a little bit easier and states he feels a little better other than feeling warm.   [EH]  1412 Sepsis antibiotics are ordered however I suspect that this is more related to dehydration and DKA.  He has LR fluid bolus ordered of approximately 50 mL/kg given his dehydration.    Lactic Acid, Venous(!!): 6.4 [EH]  1428 Has gotten bicarb since  pH, Arterial(!!): 7.061 [EH]  1428 Glucose(!!): 1,487 [EH]  1428 Creatinine(!): 5.50 [EH]  1429 Assuming a chloride of 70 with bicarb 7.4, albumin 4, sodium 110 his anion gap is 32.6.  Chloride(!): 69 [EH]  1544 I spoke with PCCM who will consult on patient. Hospitalist consult placed   [EH]    Clinical Course User Index [EH] Ollen Gross   MDM Rules/Calculators/A&P                          Patient is a 34 year old man who presents today for evaluation of altered mental status.  He has reportedly been out of his insulin for at least 3 days.  On initial evaluation he is lethargic however is protecting his own airway.  He is tachycardic and tachypneic and hypothermic.  CBG is over 600.  His EKG showed significant T wave abnormalities raising concern for hyperkalemia.  He is empirically treated with 1 amp of calcium and bicarb. CMP is obtained, his glucose is markedly elevated at 1487.  His potassium is high at 8.3, hyponatremic at 110.  His creatinine is significantly elevated at 5.05 which is elevated from his baseline.  Magnesium is slightly high at 2.5.  Covid, flu A, and flu B test is negative  Beta hydroxybutyric acid is elevated over 8. CBC shows leukocytosis at 22.8 with hemoglobin of 11.  Patient is covered with broad-spectrum antibiotics given  his hypothermia, tachycardia, tachypnea and leukocytosis.  3 L of LR is ordered as clinically he has dry mucous membranes.  ABG obtained prior to the administration of bicarb shows he is acidotic with a pH of 7.061, PCO2 arterial 18.4 with bicarb of 7.4 and acid base deficit of 23.4.  His urine shows over 500 glucose  with ketones.  He is given 10 units of IV insulin followed by started on Endo tool/glucose stabilizer.  He does have a history of a stroke, and given his altered mental status CT head is ordered.  CT head does not show acute abnormalities.  DS is positive for THC.  Ethanol is undetected.  PCCM is consulted who will see the patient in consult.  I spoke with hospitalist who will admit patient to the ICU.  Blood cultures and urine cultures were both sent.  Patient's mental status improved after he started getting fluids and IV medications.  He did not require intubation while under my care.  Note: Portions of this report may have been transcribed using voice recognition software. Every effort was made to ensure accuracy; however, inadvertent computerized transcription errors may be present  Final Clinical Impression(s) / ED Diagnoses Final diagnoses:  Hyperkalemia  Acute renal failure, unspecified acute renal failure type ([place])  Type 1 diabetes mellitus with ketoacidosis without coma ([facility])  Severe dehydration  Altered mental status, unspecified altered mental status type    Rx / DC Orders ED Discharge Orders    None       Ollen Gross 07/14/20 Randol Kern    Noemi Chapel, MD 07/15/20 [phone]

## 2020-07-14 NOTE — Telephone Encounter (Signed)
LVM--to call the office back regarding the insulin

## 2020-07-14 NOTE — Telephone Encounter (Signed)
Pleas advise below message

## 2020-07-14 NOTE — ED Notes (Signed)
Date and time results received: 07/14/20 1430 (use smartphrase ".now" to insert current time)  Test: Blood Gas  Critical Value: pH 7.061, CO2 18.4  Name of Provider Notified: Phylliss Bob   Orders Received? Or Actions Taken?:

## 2020-07-14 NOTE — ED Notes (Signed)
Date and time results received: 07/14/20  1608 (use smartphrase ".now" to insert current time)  Test: Lactic Acid Critical Value: 6.4  Name of Provider Notified: Wyn Quaker PA Orders Received? Or Actions Taken?: NA

## 2020-07-14 NOTE — Consult Note (Signed)
NAME:  Nathaniel [name], MRN:  [id number], DOB:  1986/08/07, LOS: 0 ADMISSION DATE:  07/14/2020, CONSULTATION DATE:  07/14/2020  REFERRING MD:  ED, CHIEF COMPLAINT:  DKA   Brief History   34 year old known IDDM admitted with severe DKA  History of present illness   History obtained from review of chart Brought in by family since he had altered mental status.  Known IDDM and CBG has been about 600 for the last 3 days.  Found to be in severe DKA with pH of 7.06, CBGs of 1487 with anion gap acidosis, hyperkalemia and AKI with creatinine 5.5 range, leukocytosis 22K UDS positive for THC He follows with Dr. Elayne Snare endocrine , prior hospitalizations for ketoacidosis, last 01/2018 , overall poor control with HbA1c as high as 16 , currently 10.3 Regimen is [name] 7 units daily and SSI .  Per phone notes it seems that he could not get refills on Tresiba for some reason  Past Medical History  IDDM CVA?  Residual Marijuana abuse CKD -previous creatinine documented as 1.6 in 2019 and 2.7 in 02/2020  Significant Hospital Events     Consults:    Procedures:    Significant Diagnostic Tests:  Head CT 10/25 >>  Micro Data:  Blood 10/25>>  Antimicrobials:  Cefepime 10/25 >> vanc 10/25 >>   Interim history/subjective:    Objective   Blood pressure 130/70, pulse (!) 117, temperature (!) 96.1 F (35.6 C), temperature source Rectal, resp. rate 18, height 5\' 8"  (1.727 m), weight 59 kg, SpO2 100 %.        Intake/Output Summary (Last 24 hours) at 07/14/2020 1605 Last data filed at 07/14/2020 1513 Gross per 24 hour  Intake 2150 ml  Output --  Net 2150 ml   Filed Weights   07/14/20 1314  Weight: 59 kg    Examination: Gen:      Thin, young man, no distress  HEENT:  EOMI, sclera anicteric, mild pallor, dry tongue , sunken eyes Neck:     No JVD; no thyromegaly Lungs:    Clear BL CV:         Regular rate and rhythm; no murmurs Abd:      + bowel sounds; soft, non-tender; no  palpable masses, no distension Ext:    No edema; adequate peripheral perfusion Skin:      Warm and dry; no rash Neuro: alert and oriented x 3  EKG - sinus tach, peak T waves V1-4  Resolved Hospital Problem list     Assessment & Plan:  Severe DKA, primarily due to not taking his maintenance tresiba , he had been taking SSI , cannot r/o occult infection  Has received 3 L fluid bolus Agree with insulin gtt Would use LR primarily rather than NS to avoid hyperchloremia Expect hyperkalemia to improve as acidosis improves & potassium shifts into cells - has received calcium already Would dc bicarb gtt  (formulated in D5 ) - expect acidosis to improve, mentals status has already improved, can obtain head CT for completion given his prior history Lactic acidosis indicates his hypovolemic state, but OK for broad spectrum antibiotics until cultures back D51/2 can be switched once CBGs down to 250 range if AG has not corrected by then   AKI on CKD - will see where his creat settles after hydration, last 2.7 noted in 02/2020  PCCM to follow, triad to admit  Best practice:   Code Status: full Family Communication: per primary Disposition: ICU  Labs  CBC: Recent Labs  Lab 07/14/20 1323 07/14/20 1324 07/14/20 1345  WBC  --  22.8*  --   NEUTROABS  --  21.1*  --   HGB 13.3 11.0* 13.3  HCT 39.0 37.8* 39.0  MCV  --  101.9*  --   PLT  --  372  --     Basic Metabolic Panel: Recent Labs  Lab 07/14/20 1323 07/14/20 1324 07/14/20 1345  NA 108* 110* 108*  K 8.1* 8.3* 8.3*  CL 78* 69* 77*  CO2  --  <7*  --   GLUCOSE >700* 1,487* >700*  BUN 82* 88* 90*  CREATININE 5.40* 5.50* 5.40*  CALCIUM  --  7.9*  --   MG  --  2.5*  --    GFR: Estimated Creatinine Clearance: 16.2 mL/min (A) (by C-G formula based on SCr of 5.4 mg/dL (H)). Recent Labs  Lab 07/14/20 1322 07/14/20 1324  WBC  --  22.8*  LATICACIDVEN 6.4*  --     Liver Function Tests: Recent Labs  Lab 07/14/20 1324  AST  16  ALT 17  ALKPHOS 110  BILITOT 2.3*  PROT 7.5  ALBUMIN 4.0   Recent Labs  Lab 07/14/20 1324  LIPASE 28   Recent Labs  Lab 07/14/20 1402  AMMONIA 36*    ABG    Component Value Date/Time   PHART 7.061 (LL) 07/14/2020 1350   PCO2ART 18.4 (LL) 07/14/2020 1350   PO2ART 128 (H) 07/14/2020 1350   HCO3 7.4 (L) 07/14/2020 1350   TCO2 6 (L) 07/14/2020 1345   ACIDBASEDEF 23.4 (H) 07/14/2020 1350   O2SAT 96.5 07/14/2020 1350     Coagulation Profile: No results for input(s): INR, PROTIME in the last 168 hours.  Cardiac Enzymes: No results for input(s): CKTOTAL, CKMB, CKMBINDEX, TROPONINI in the last 168 hours.  HbA1C: Hemoglobin A1C  Date/Time Value Ref Range Status  03/20/2020 09:20 AM 10.3 (A) 4.0 - 5.6 % Final  09/25/2019 03:15 PM 11.0 (A) 4.0 - 5.6 % Final   Hgb A1c MFr Bld  Date/Time Value Ref Range Status  02/19/2018 05:59 AM 10.0 (H) 4.8 - 5.6 % Final    Comment:    (NOTE) Pre diabetes:          5.7%-6.4% Diabetes:              >6.4% Glycemic control for   <7.0% adults with diabetes   02/17/2018 01:23 AM 10.1 (H) 4.8 - 5.6 % Final    Comment:    (NOTE) Pre diabetes:          5.7%-6.4% Diabetes:              >6.4% Glycemic control for   <7.0% adults with diabetes     CBG: Recent Labs  Lab 07/14/20 1318 07/14/20 1547  GLUCAP >600* >600*    Review of Systems:   Unable to obtain in detail since altered mental status  Past Medical History  He,  has a past medical history of Diabetes mellitus, Hyperlipidemia, Hypertension, Marijuana abuse, Noncompliance with medication regimen, Stroke ([place]), and Tobacco abuse.   Surgical History    Past Surgical History:  Procedure Laterality Date  . EYE SURGERY       Social History   reports that he has been smoking cigarettes. He has a 2.00 pack-year smoking history. He has never used smokeless tobacco. He reports current drug use. Drug: Marijuana. He reports that he does not drink alcohol.   Family  History  His family history includes Diabetes in his father, maternal grandmother, and paternal grandfather; Hypertension in his mother; Kidney failure in his father; Pancreatic cancer in his maternal grandfather.   Allergies Allergies  Allergen Reactions  . Fructose Other (See Comments)    Increase of blood sugar      Home Medications  Prior to Admission medications   Medication Sig Start Date End Date Taking? Authorizing Provider  acetaminophen (TYLENOL) 325 MG tablet Take 1-2 tablets (325-650 mg total) by mouth every 4 (four) hours as needed for mild pain. 03/07/18   Love, Ivan Anchors, PA-C  amLODipine (NORVASC) 10 MG tablet Take 1 tablet (10 mg total) by mouth daily. 03/08/18   Love, Ivan Anchors, PA-C  aspirin 325 MG EC tablet TAKE 1 TABLET BY MOUTH EVERY DAY 04/21/18   Meredith Staggers, MD  atorvastatin (LIPITOR) 80 MG tablet Take 1 tablet (80 mg total) by mouth daily at 6 PM. 03/07/18   Love, Ivan Anchors, PA-C  clopidogrel (PLAVIX) 75 MG tablet Take 75 mg by mouth daily. 03/27/18   [provider]  Continuous Blood Gluc Sensor (FREESTYLE LIBRE 2 SENSOR) MISC 2 Devices by Does not apply route every 14 (fourteen) days. 04/30/20   Elayne Snare, MD  glucagon 1 MG injection Inject 1 mg into the vein once as needed for up to 1 dose. 08/29/18   Shamleffer, Melanie Crazier, MD  insulin aspart (NOVOLOG) 100 UNIT/ML FlexPen Inject per sliding scale before mealsI: Blood sugars between 200-250 Take 2 units, 250-300 take 3 units, Over 300 Take 4 units 03/20/20   Elayne Snare, MD  insulin degludec (TRESIBA FLEXTOUCH) 100 UNIT/ML FlexTouch Pen Inject up to 10 units once daily as directed 07/12/20   Elayne Snare, MD  Insulin Pen Needle (PEN NEEDLES 3/16") 31G X 5 MM MISC Four times day 07/06/18   Shamleffer, Melanie Crazier, MD  lisinopril (PRINIVIL,ZESTRIL) 40 MG tablet Take 1 tablet (40 mg total) by mouth daily. 07/19/18   Shamleffer, Melanie Crazier, MD  ONETOUCH ULTRA test strip USE TO TEST BLOOD SUGAR 4  TIMES A DAY (INSURANCE WILL ONLY PAY FOR HIM TO TEST 3 TIMES A DAY) 06/18/20   Elayne Snare, MD     Kara Mead MD. FCCP. [facility] See Amion for pager  If no response to pager , please call [phone]  After 7:00 pm call Elink  [phone]   07/14/2020

## 2020-07-14 NOTE — ED Notes (Signed)
Date and time results received: 07/14/20 1430 (use smartphrase ".now" to insert current time)  Test: CMP  Critical Value: Na 110, K 8.3, Glucose 1487  Name of Provider Notified: Phylliss Bob  Orders Received? Or Actions Taken?:

## 2020-07-14 NOTE — Telephone Encounter (Signed)
Prescription was sent on Saturday for 10 units daily, that should not change how much he needs per month.  Please see my previous comments also

## 2020-07-14 NOTE — Telephone Encounter (Signed)
Called pt mother --stated that pt been taking 10 units a day due to his sugar increase. For the past 2 days pt reading 600 and today pt drank [place] punch juice this morning, sugar reading is 600+ and been laying around for couple days. Pt mother stated --will take the pt  to ER if the sugar not going down.

## 2020-07-14 NOTE — Progress Notes (Signed)
Pharmacy Antibiotic Note  Nathaniel [name] is a 34 y.o. male admitted on 07/14/2020 with unknown source.  Pharmacy has been consulted for Vancomycin and cefepime dosing. DKA, hyperkalemia, AKI, hyponatremic Plan: Vancomycin 1000mg  loading dose, then 750mg  IV q48h Cefepime 2gm IV q24h F/U cxs and clinical progress Monitor V/S, labs and levels as indicated  Height: 5\' 8"  (172.7 cm) Weight: 59 kg (130 lb) IBW/kg (Calculated) : 68.4  Temp (24hrs), Avg:95.1 F (35.1 C), Min:95.1 F (35.1 C), Max:95.1 F (35.1 C)  Recent Labs  Lab 07/14/20 1323 07/14/20 1324  WBC  --  22.8*  CREATININE 5.40*  --     Estimated Creatinine Clearance: 16.2 mL/min (A) (by C-G formula based on SCr of 5.4 mg/dL (H)).    Allergies  Allergen Reactions  . Fructose Other (See Comments)    Increase of blood sugar     Antimicrobials this admission: Vancomycin 10/25 >> Cefepime 10/25 >>  Flagyl x 1 dose in ED Dose adjustments this admission: prn  Microbiology results: 10/25 BCx: pending 10/25 UCx: pending  MRSA PCR:   Thank you for allowing pharmacy to be a part of this patient's care.  Isac Sarna, BS Pharm D, [place] Clinical Pharmacist Pager [phone] 07/14/2020 2:01 PM

## 2020-07-14 NOTE — Telephone Encounter (Signed)
Needs to be checking his sugars 4 times a day and take additional NovoLog when blood sugars are high.  Has the question about insulin refill been addressed?

## 2020-07-15 ENCOUNTER — Other Ambulatory Visit: Payer: Self-pay | Admitting: *Deleted

## 2020-07-15 ENCOUNTER — Other Ambulatory Visit: Payer: Self-pay | Admitting: Endocrinology

## 2020-07-15 DIAGNOSIS — R6510 Systemic inflammatory response syndrome (SIRS) of non-infectious origin without acute organ dysfunction: Secondary | ICD-10-CM | POA: Diagnosis present

## 2020-07-15 DIAGNOSIS — G9341 Metabolic encephalopathy: Secondary | ICD-10-CM

## 2020-07-15 DIAGNOSIS — E1010 Type 1 diabetes mellitus with ketoacidosis without coma: Secondary | ICD-10-CM | POA: Diagnosis not present

## 2020-07-15 DIAGNOSIS — N179 Acute kidney failure, unspecified: Secondary | ICD-10-CM | POA: Diagnosis not present

## 2020-07-15 DIAGNOSIS — N1832 Chronic kidney disease, stage 3b: Secondary | ICD-10-CM

## 2020-07-15 DIAGNOSIS — I10 Essential (primary) hypertension: Secondary | ICD-10-CM

## 2020-07-15 DIAGNOSIS — E1065 Type 1 diabetes mellitus with hyperglycemia: Secondary | ICD-10-CM

## 2020-07-15 LAB — COMPREHENSIVE METABOLIC PANEL
ALT: 14 U/L (ref 0–44)
AST: 24 U/L (ref 15–41)
Albumin: 3.3 g/dL — ABNORMAL LOW (ref 3.5–5.0)
Alkaline Phosphatase: 84 U/L (ref 38–126)
Anion gap: 13 (ref 5–15)
BUN: 79 mg/dL — ABNORMAL HIGH (ref 6–20)
CO2: 27 mmol/L (ref 22–32)
Calcium: 8.6 mg/dL — ABNORMAL LOW (ref 8.9–10.3)
Chloride: 91 mmol/L — ABNORMAL LOW (ref 98–111)
Creatinine, Ser: 4.82 mg/dL — ABNORMAL HIGH (ref 0.61–1.24)
GFR, Estimated: 15 mL/min — ABNORMAL LOW (ref >60)
Glucose, Bld: 319 mg/dL — ABNORMAL HIGH (ref 70–99)
Potassium: 3.6 mmol/L (ref 3.5–5.1)
Sodium: 131 mmol/L — ABNORMAL LOW (ref 135–145)
Total Bilirubin: 0.9 mg/dL (ref 0.3–1.2)
Total Protein: 6.4 g/dL — ABNORMAL LOW (ref 6.5–8.1)

## 2020-07-15 LAB — GLUCOSE, CAPILLARY
Glucose-Capillary: 122 mg/dL — ABNORMAL HIGH (ref 70–99)
Glucose-Capillary: 145 mg/dL — ABNORMAL HIGH (ref 70–99)
Glucose-Capillary: 146 mg/dL — ABNORMAL HIGH (ref 70–99)
Glucose-Capillary: 157 mg/dL — ABNORMAL HIGH (ref 70–99)
Glucose-Capillary: 159 mg/dL — ABNORMAL HIGH (ref 70–99)
Glucose-Capillary: 165 mg/dL — ABNORMAL HIGH (ref 70–99)
Glucose-Capillary: 167 mg/dL — ABNORMAL HIGH (ref 70–99)
Glucose-Capillary: 168 mg/dL — ABNORMAL HIGH (ref 70–99)
Glucose-Capillary: 169 mg/dL — ABNORMAL HIGH (ref 70–99)
Glucose-Capillary: 178 mg/dL — ABNORMAL HIGH (ref 70–99)
Glucose-Capillary: 204 mg/dL — ABNORMAL HIGH (ref 70–99)
Glucose-Capillary: 29 mg/dL — CL (ref 70–99)
Glucose-Capillary: 297 mg/dL — ABNORMAL HIGH (ref 70–99)
Glucose-Capillary: 36 mg/dL — CL (ref 70–99)
Glucose-Capillary: 416 mg/dL — ABNORMAL HIGH (ref 70–99)
Glucose-Capillary: 496 mg/dL — ABNORMAL HIGH (ref 70–99)
Glucose-Capillary: 584 mg/dL (ref 70–99)

## 2020-07-15 LAB — BETA-HYDROXYBUTYRIC ACID: Beta-Hydroxybutyric Acid: 1.93 mmol/L — ABNORMAL HIGH (ref 0.05–0.27)

## 2020-07-15 LAB — CBC WITH DIFFERENTIAL/PLATELET
Abs Immature Granulocytes: 0.12 10*3/uL — ABNORMAL HIGH (ref 0.00–0.07)
Basophils Absolute: 0.1 10*3/uL (ref 0.0–0.1)
Basophils Relative: 0 %
Eosinophils Absolute: 0 10*3/uL (ref 0.0–0.5)
Eosinophils Relative: 0 %
HCT: 28.1 % — ABNORMAL LOW (ref 39.0–52.0)
Hemoglobin: 10.3 g/dL — ABNORMAL LOW (ref 13.0–17.0)
Immature Granulocytes: 1 %
Lymphocytes Relative: 15 %
Lymphs Abs: 2.8 10*3/uL (ref 0.7–4.0)
MCH: 30.1 pg (ref 26.0–34.0)
MCHC: 36.7 g/dL — ABNORMAL HIGH (ref 30.0–36.0)
MCV: 82.2 fL (ref 80.0–100.0)
Monocytes Absolute: 1.1 10*3/uL — ABNORMAL HIGH (ref 0.1–1.0)
Monocytes Relative: 6 %
Neutro Abs: 15.3 10*3/uL — ABNORMAL HIGH (ref 1.7–7.7)
Neutrophils Relative %: 78 %
Platelets: 328 10*3/uL (ref 150–400)
RBC: 3.42 MIL/uL — ABNORMAL LOW (ref 4.22–5.81)
RDW: 12.4 % (ref 11.5–15.5)
WBC: 19.4 10*3/uL — ABNORMAL HIGH (ref 4.0–10.5)
nRBC: 0 % (ref 0.0–0.2)

## 2020-07-15 LAB — BASIC METABOLIC PANEL
Anion gap: 12 (ref 5–15)
BUN: 73 mg/dL — ABNORMAL HIGH (ref 6–20)
CO2: 27 mmol/L (ref 22–32)
Calcium: 8.2 mg/dL — ABNORMAL LOW (ref 8.9–10.3)
Chloride: 96 mmol/L — ABNORMAL LOW (ref 98–111)
Creatinine, Ser: 4.52 mg/dL — ABNORMAL HIGH (ref 0.61–1.24)
GFR, Estimated: 17 mL/min — ABNORMAL LOW (ref >60)
Glucose, Bld: 171 mg/dL — ABNORMAL HIGH (ref 70–99)
Potassium: 3.4 mmol/L — ABNORMAL LOW (ref 3.5–5.1)
Sodium: 135 mmol/L (ref 135–145)

## 2020-07-15 LAB — TSH: TSH: 0.762 u[IU]/mL (ref 0.350–4.500)

## 2020-07-15 LAB — HEMOGLOBIN A1C
Hgb A1c MFr Bld: 11 % — ABNORMAL HIGH (ref 4.8–5.6)
Mean Plasma Glucose: 269 mg/dL

## 2020-07-15 LAB — PROCALCITONIN: Procalcitonin: 2.29 ng/mL

## 2020-07-15 LAB — MAGNESIUM: Magnesium: 1.9 mg/dL (ref 1.7–2.4)

## 2020-07-15 MED ORDER — TRESIBA FLEXTOUCH 100 UNIT/ML ~~LOC~~ SOPN: PEN_INJECTOR | SUBCUTANEOUS | 1 refills | Status: DC

## 2020-07-15 MED ORDER — INSULIN GLARGINE 100 UNIT/ML ~~LOC~~ SOLN
15.0000 [IU] | Freq: Every day | SUBCUTANEOUS | Status: DC
  Administered 2020-07-15 – 2020-07-16 (×2): 15 [IU] via SUBCUTANEOUS
  Filled 2020-07-15 (×3): qty 0.15

## 2020-07-15 MED ORDER — INSULIN ASPART 100 UNIT/ML ~~LOC~~ SOLN: 4.0000 [IU] | Freq: Three times a day (TID) | SUBCUTANEOUS | Status: DC

## 2020-07-15 MED ORDER — INSULIN ASPART 100 UNIT/ML ~~LOC~~ SOLN
3.0000 [IU] | Freq: Three times a day (TID) | SUBCUTANEOUS | Status: DC
  Administered 2020-07-16 (×2): 3 [IU] via SUBCUTANEOUS

## 2020-07-15 MED ORDER — INSULIN ASPART 100 UNIT/ML ~~LOC~~ SOLN
12.0000 [IU] | Freq: Once | SUBCUTANEOUS | Status: AC
  Administered 2020-07-15: 12 [IU] via SUBCUTANEOUS

## 2020-07-15 MED ORDER — CHLORHEXIDINE GLUCONATE CLOTH 2 % EX PADS
6.0000 | MEDICATED_PAD | Freq: Every day | CUTANEOUS | Status: DC
  Administered 2020-07-15: 6 via TOPICAL

## 2020-07-15 MED ORDER — INSULIN ASPART 100 UNIT/ML ~~LOC~~ SOLN
0.0000 [IU] | Freq: Three times a day (TID) | SUBCUTANEOUS | Status: DC
  Administered 2020-07-16: 3 [IU] via SUBCUTANEOUS
  Administered 2020-07-16: 11 [IU] via SUBCUTANEOUS

## 2020-07-15 MED ORDER — AMLODIPINE BESYLATE 5 MG PO TABS: 2.5000 mg | ORAL_TABLET | Freq: Every day | ORAL | Status: DC

## 2020-07-15 MED ORDER — SODIUM CHLORIDE 0.9 % IV SOLN: INTRAVENOUS | Status: DC

## 2020-07-15 MED ORDER — INSULIN ASPART 100 UNIT/ML ~~LOC~~ SOLN: 0.0000 [IU] | Freq: Every day | SUBCUTANEOUS | Status: DC

## 2020-07-15 MED ORDER — INSULIN ASPART 100 UNIT/ML ~~LOC~~ SOLN: 0.0000 [IU] | Freq: Three times a day (TID) | SUBCUTANEOUS | Status: DC

## 2020-07-15 NOTE — Telephone Encounter (Signed)
Please clarify if he has been taking 15 units of [name].  He should be taking extra NovoLog dose when the blood sugars are high.  Can send new prescription of [name] stating maximum dose 20 units daily.  Also it will be necessary for her to take the patient to see Christean Grief, they diabetes educator to learn how to manage his diabetes.  I will send another referral.  Also find out if she called [employer] as instructed to order the sensors

## 2020-07-15 NOTE — Progress Notes (Signed)
Inpatient Diabetes Program Recommendations  AACE/ADA: New Consensus Statement on Inpatient Glycemic Control (2015)  Target Ranges:  Prepandial:   less than 140 mg/dL      Peak postprandial:   less than 180 mg/dL (1-2 hours)      Critically ill patients:  140 - 180 mg/dL   Lab Results  Component Value Date   GLUCAP 165 (H) 07/15/2020   HGBA1C 11.0 (H) 07/14/2020    Review of Glycemic Control  Diabetes history: DM type 1 Outpatient Diabetes medications: Tresiba 20 units, Novolog 200-250: 2 units, 250-300: 3 units, >300: 4 units Current orders for Inpatient glycemic control:  IV insulin gtt transitioning to Lantus 15 units  A1c 11% habitually high, sees Dr. Dwyane Dee, Endocrinology. Renal function elevated. troponin's elevated  Glucose 1,487.  Has seen Leonia Reader, CDE for DM education on 8/11 and will be scheduled again according to Dr. Dwyane Dee communication with mom (who always communicates needs regarding refills and appts) Not sure if pt was giving more [name] than prescribed as there are notes needing refills of Tresiba on 10/22.  May touch base with pt tomorrow. Unsure if pt willing to talk as in nursing note pt not verbally communicating with nursing staff.   PT has follow up and insulin at home. Pt drinks sunny D and Hawaiian punch at home at 3 am and during the day. Noncompliant after multiple educational sessions.  -  Also order Novolog 0-9 units tid + hs  May also need meal coverage when eating meals. Will follow glucose trends.  Thanks,  Tama Headings RN, MSN, BC-ADM Inpatient Diabetes Coordinator Team Pager [phone] (8a-5p)

## 2020-07-15 NOTE — Telephone Encounter (Signed)
Called pt mom--stated took the pt at the ICU[facility] yesterday and had the reading 1300. Pt mother stated --found out pt taking more insulin that he needed like 12-15 units when sugar is high. Pt mother mention pt like to snack early morning 3:00 am like drinking whole jug of hawaiin punch juice and found some candies. Pt mother requesting some sample of levimir or tresiba if any available since next refill available Nov 22 or 28.

## 2020-07-15 NOTE — Progress Notes (Addendum)
Hypoglycemic Event  CBG: 36  Treatment: Pt refused additional oral intake. Gave 25g IV dextrose.   Symptoms: None  Follow-up CBG: Time: 1723 CBG Result: 157   Possible Reasons for Event: Pt refusing PO intake.      Duwayne Heck

## 2020-07-15 NOTE — Progress Notes (Signed)
NAME:  Nathaniel [name], MRN:  [id number], DOB:  03-Mar-1986, LOS: 1 ADMISSION DATE:  07/14/2020, CONSULTATION DATE:  07/15/2020  REFERRING MD:  ED, CHIEF COMPLAINT:  DKA   Brief History   34 year old known IDDM admitted with severe DKA  History of present illness   History obtained from review of chart Brought in by family since he had altered mental status.  Known IDDM and CBG has been about 600 for the last 3 days.  Found to be in severe DKA with pH of 7.06, CBGs of 1487 with anion gap acidosis, hyperkalemia and AKI with creatinine 5.5 range, leukocytosis 22K UDS positive for THC He follows with Dr. Elayne Snare endocrine , prior hospitalizations for ketoacidosis, last 01/2018 , overall poor control with HbA1c as high as 16 , currently 10.3 Regimen is [name] 7 units daily and SSI .  Per phone notes it seems that he could not get refills on Tresiba for some reason  Past Medical History  IDDM CVA?  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 rate 16, height 5\' 8"  (1.727 m), weight 71.3 kg, SpO2 100 %.        Intake/Output Summary (Last 24 hours) at 07/15/2020 0841 Last data filed at 07/15/2020 0700 Gross per 24 hour  Intake 4061.03 ml  Output 300 ml  Net 3761.03 ml   Filed Weights   07/14/20  1314 07/14/20 1800  Weight: 59 kg 71.3 kg    Examination: Gen:      Thin, young man, no distress , lying supine HEENT:  EOMI, sclera anicteric, mild pallor,  Tongue moist Neck:     No JVD; no thyromegaly Lungs:    Clear BL CV:         Regular rate and rhythm; no murmurs Abd:      + bowel sounds; soft, non-tender; no palpable masses, no distension Ext:    No edema; adequate peripheral perfusion Skin:      Warm and dry; no rash Neuro: awake, follows commands, answers in monosyllables  EKG - sinus tach, peak T waves V1-4 , QTC 408  Resolved Hospital Problem list   Lactic acidosis   Assessment & Plan:  Severe DKA, primarily due to not taking his maintenance tresiba ,  cannot r/o occult infection  AG down to 13 As expected, Na has corrected & now hypokalemia, lactate resolved He is on D5LR & 1 u/h insulin gtt Check rpt BMET & b-hydroxy butyrate , if normalised, can give SQ long acting insulin & turn off gtt in 2h, allow to eat & dc dextrose IVFs  Sepsis - ct empiric cefepime since pct slight high (but not reliable in renal failure) Dc vanc   AKI on CKD - will see where his creat settles after hydration, last 2.7 noted in  02/2020 Dc ACE  ? Need for ENT consult as outpatient  PCCM available as needed  Best practice:   Code Status: full Family Communication: per primary Disposition: ICU  Labs   CBC: Recent Labs  Lab 07/14/20 1323 07/14/20 1324 07/14/20 1345 07/15/20 0327  WBC  --  22.8*  --  19.4*  NEUTROABS  --  21.1*  --  15.3*  HGB 13.3 11.0* 13.3 10.3*  HCT 39.0 37.8* 39.0 28.1*  MCV  --  101.9*  --  82.2  PLT  --  372  --  353    Basic Metabolic Panel: Recent Labs  Lab 07/14/20 1323 07/14/20 1324 07/14/20 1345 07/14/20 1558 07/15/20 0327  NA 108* 110* 108* 116* 131*  K 8.1* 8.3* 8.3* 5.9* 3.6  CL 78* 69* 77* 75* 91*  CO2  --  <7*  --  9* 27  GLUCOSE >700* 1,487* >700* 1,350* 319*  BUN 82* 88* 90* 87* 79*  CREATININE 5.40* 5.50* 5.40* 5.53* 4.82*    CALCIUM  --  7.9*  --  7.6* 8.6*  MG  --  2.5*  --   --  1.9   GFR: Estimated Creatinine Clearance: 21.1 mL/min (A) (by C-G formula based on SCr of 4.82 mg/dL (H)). Recent Labs  Lab 07/14/20 1322 07/14/20 1324 07/14/20 1510 07/14/20 1935 07/14/20 2144 07/15/20 0327  PROCALCITON  --   --   --  1.43  --  2.29  WBC  --  22.8*  --   --   --  19.4*  LATICACIDVEN 6.4*  --  6.4* 2.0* 1.8  --    Kara Mead MD. [place] Flood. [facility] See Amion for pager  If no response to pager , please call [phone]  After 7:00 pm call Elink  [phone]   07/15/2020

## 2020-07-15 NOTE — Progress Notes (Signed)
Pt requiring frequent encouragement to increase PO intake. Pt refusing to eat or drink and having hypoglycemic episodes (pt is completely asymptomatic during episodes). Per pt's mother at bedside, pt is a "night owl" and normally sleeps during the day and eats his meals during the night. Pt's mother requested meal trays be delivered during the night since pt will not eat trays delivered during the day. Explained that while we are unable to deliver meal trays during the night, we can provide pt with other food options and snacks that we have available on the unit.

## 2020-07-15 NOTE — TOC Initial Note (Signed)
Transition of Care [facility]) - Initial/Assessment Note    Patient Details  Name: [name] MRN: [id number] Date of Birth: November 15, 1985  Transition of Care [facility]) CM/SW Contact:    Boneta Lucks, RN Phone Number: 07/15/2020, 3:13 PM  Clinical Narrative:     Patient admitted with DKA, Patient has high risk for readmission score. Mother at the bedside providing history. Patient live with her and drives. He has been to 3 nutrition classes, but like everyone does not always follow diet. Patient has 3 month insulin supply but ran out before he could refill. His PCP is working on another prescription. TOC to follow.             Expected Discharge Plan: Home/Self Care Barriers to Discharge: Continued Medical Work up   Patient Goals and CMS Choice Patient states their goals for this hospitalization and ongoing recovery are:: to return home. CMS Medicare.gov Compare Post Acute Care list provided to:: Patient Represenative (must comment) Choice offered to / list presented to : Parent  Expected Discharge Plan and Services Expected Discharge Plan: Home/Self Care     Prior Living Arrangements/Services           Need for Family Participation in Patient Care: Yes (Comment) Care giver support system in place?: Yes (comment)   Criminal Activity/Legal Involvement Pertinent to Current Situation/Hospitalization: No - Comment as needed  Permission Sought/Granted      Share Information with NAME: Nathaniel [name]     Permission granted to share info w Relationship: Mother     Emotional Assessment    Orientation: : Oriented to Self, Oriented to Place, Oriented to  Time, Oriented to Situation Alcohol / Substance Use: Not Applicable Psych Involvement: No (comment)  Admission diagnosis:  Hyperkalemia [E87.5] DKA (diabetic ketoacidosis) ([place]) [E11.10] Severe dehydration [E86.0] Acute renal failure, unspecified acute renal failure type ([place]) [N17.9] Altered mental status, unspecified altered  mental status type [R41.82] Type 1 diabetes mellitus with ketoacidosis without coma ([place]) [E10.10] Patient Active Problem List   Diagnosis Date Noted  . DKA (diabetic ketoacidosis) ([place]) 07/14/2020  . 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 Pseudophakia 01/28/2020  . Stage 3 chronic kidney disease ([place])   . Hypoglycemia   . Neurosyphilis   . Mixed hyperlipidemia   . Acute ischemic left MCA stroke ([place]) 02/22/2018  . Abnormality of gait following cerebrovascular accident (CVA)   . Type 1 diabetes mellitus with peripheral circulatory complications ([place])   . Essential hypertension   . Dyslipidemia   . Syphilis   . DKA (diabetic ketoacidoses) 02/16/2018  . CVA (cerebral vascular accident) ([place]) 02/16/2018  . Acute renal failure (ARF) ([place]) 10/21/2016  . Diabetic ketoacidosis without coma associated with type 1 diabetes mellitus ([place])   . Hyperbilirubinemia   . Hyponatremia 04/15/2016  . DM (diabetes mellitus) type 1, uncontrolled, with ketoacidosis ([place]) 04/15/2016  . Hyperglycemia 04/15/2016  . AKI (acute kidney injury) ([place]) 01/04/2016  . Malignant hypertension 01/04/2016  . Noncompliance with medications 01/04/2016  . Intractable nausea and vomiting 04/01/2015  . Gastroparesis 04/01/2015  . Type 1 diabetes mellitus with complication ([place]) 25/95/6387  . Chronic hypertension   . Nausea with vomiting   . Abscess, gluteal, right 06/21/2014  . Nausea and vomiting 04/08/2013  . Hyperglycemia without ketosis 04/08/2013  .  Essential hypertension, benign 04/08/2013  . Hypercalcemia 07/29/2011  . Vomiting 07/28/2011  . Leukocytosis 07/28/2011  . Hypokalemia 07/28/2011  . DKA, type 1 ([place]) 07/27/2011  .  Dehydration 07/27/2011  . Smoker 07/27/2011  . Marijuana abuse 07/27/2011   PCP:  [name], MD Pharmacy:   [id number] - [place], [address] [place] [address] Phone: [phone] Fax: [phone]     Social Determinants of Health (SDOH) Interventions    Readmission Risk Interventions Readmission Risk Prevention Plan 07/15/2020  Transportation Screening Complete  HRI or Home Care Consult Complete  Social Work Consult for [place] Planning/Counseling Complete  Palliative Care Screening Not Applicable  Medication Review Press photographer) Complete  Some recent data might be hidden

## 2020-07-15 NOTE — Progress Notes (Addendum)
  Hypoglycemic Event  CBG:29  Treatment: Pt asymptomatic, taking PO. Given orange juice and crackers.   Symptoms: None  Follow-up CBG: Time: 0452 CBG Result: 36  Possible Reasons for Event: Poor appetite, refused lunch.       Nathaniel [name]

## 2020-07-15 NOTE — Progress Notes (Signed)
Pt has gotten out of bed. Removed condom cath and urinated onto the floor twice. He refuses to speak and removes equipment but will acknowledge with yes no nods.Marland KitchenMarland Kitchen

## 2020-07-15 NOTE — Telephone Encounter (Signed)
Pt mother stated -- not sure if pt taking  15 units of Tresiba but know pt taking the extra Novolog when sugar are high. Notified pt mother referral to see Vaughan Basta and sent the Rx [name] with instructions [facility]. Pt mother given the info to call [facility].

## 2020-07-15 NOTE — Progress Notes (Signed)
Patient Demographics:    Nathaniel [name], is a 34 y.o. male, DOB - 1985-11-03, QPR:[id number]  Admit date - 07/14/2020   Admitting Physician Nathaniel Plate, DO  Outpatient Primary MD for the patient is Nathaniel Gaskins, MD  LOS - 1   Chief Complaint  Patient presents with  . Altered Mental Status        Subjective:    Nathaniel [name] today has no fevers, no emesis,  No chest pain,   --Not very talkative, -Denies significant abdominal pain, no productive cough -No dysuria  Assessment  & Plan :    Principal Problem:   DKA (diabetic ketoacidosis) ([place]) Active Problems:   AKI (acute kidney injury) ([place])   Acute metabolic encephalopathy due to DKA/AKI/Dehydration   Stage 3b chronic kidney disease    Essential hypertension   SIRS (systemic inflammatory response syndrome) ([facility])  Brief Summary:- 34 year old male with a history of tobacco dependence, stroke, hypertension, hyperlipidemia, diabetes mellitus type 1, medical noncompliance admitted on 07/14/2020 with DKA, AKI on CKD stage IIIb acute metabolic encephalopathy secondary to DKA and AKI  A/p  1)DKA-in a type I diabetic-- --serum glucose peaked at 1487 --Bicarb was less than 7 -Anion gap was greater than 20 -Beta hydroxybutyric acid was greater than 20 currently down to 1.9 --With IV fluids and IV insulin therapy DKA pathophysiology has resolved, bicarb and anion gap is normalized -Patient transition from IV insulin to subcu insulin -Diabetic educator input appreciated patient remains notoriously noncompliant with diet, lifestyle and medications -Insulin therapy recommendations from patient's endocrinologist Dr. Clydene [name] noted  2) pseudohyponatremia and hyperkalemia-----in the setting of severe hyperglycemia and DKA -Potassium peaked at 8.3 -Uncorrected sodium was as low as 108 -Sodium is currently normal at 135, potassium is  in the 3.5 range --- Continue to hydrate, replace electrolytes and monitor closely  3)AKI----acute kidney injury on CKD stage -3b    creatinine this admission peaked at 5.53 , baseline creatinine = around 2 from March 2020 (more recent creatinine not available)    , creatinine is now= 4.52,  renally adjust medications, avoid nephrotoxic agents / dehydration  / hypotension -Continue IV fluids  4) acute metabolic encephalopathy--secondary to #1, #2 #3 above--- appears to have resolved  5) leukocytosis and SIRS--no evidence of acute infection at this time -This may be reactive secondary to DKA and AKI related -Vancomycin discontinued -Okay to continue cefepime for now pending urine and blood cultures -WBC currently down to 19.4 from 22.8 - 6) chronic anemia--multifactorial, Hgb currently above 10 despite aggressive hydration -No evidence of bleeding, continue to monitor  7) tobacco abuse--- nicotine patch is ordered  Disposition/Need for in-Hospital Stay- patient unable to be discharged at this time due to --SIRS and leukocytosis requiring IV antibiotics and AKI and DKA requiring IV fluids*  Status is: Inpatient  Remains inpatient appropriate because:SIRS and leukocytosis requiring IV antibiotics and AKI and DKA requiring IV fluids*   Disposition: The patient is from: Home              Anticipated d/c is to: Home              Anticipated d/c date is: 1 day  Patient currently is not medically stable to d/c. Barriers: Not Clinically Stable- SIRS and leukocytosis requiring IV antibiotics and AKI and DKA requiring IV fluids*  Code Status : full  Family Communication:  (patient is alert, awake and coherent)    Consults  :  PCCM  DVT Prophylaxis  :    - Heparin - SCDs    Lab Results  Component Value Date   PLT 328 07/15/2020    Inpatient Medications  Scheduled Meds: . [START ON 07/16/2020] amLODipine  2.5 mg Oral Daily  . aspirin  325 mg Oral Daily  .  atorvastatin  80 mg Oral q1800  . Chlorhexidine Gluconate Cloth  6 each Topical Daily  . clopidogrel  75 mg Oral Daily  . heparin  5,000 Units Subcutaneous Q8H  . insulin aspart  0-15 Units Subcutaneous TID WC  . insulin aspart  0-5 Units Subcutaneous QHS  . insulin aspart  3 Units Subcutaneous TID WC  . insulin glargine  15 Units Subcutaneous Daily  . nicotine  21 mg Transdermal Daily   Continuous Infusions: . sodium chloride 100 mL/hr at 07/15/20 1508  . ceFEPime (MAXIPIME) IV 2 g (07/15/20 1316)   PRN Meds:.acetaminophen **OR** acetaminophen, dextrose, oxyCODONE, promethazine    Anti-infectives (From admission, onward)   Start     Dose/Rate Route Frequency Ordered Stop   07/16/20 1500  vancomycin (VANCOREADY) IVPB 750 mg/150 mL  Status:  Discontinued        750 mg 150 mL/hr over 60 Minutes Intravenous Every 48 hours 07/14/20 1407 07/15/20 0843   07/15/20 1400  ceFEPIme (MAXIPIME) 2 g in sodium chloride 0.9 % 100 mL IVPB        2 g 200 mL/hr over 30 Minutes Intravenous Every 24 hours 07/14/20 1407     07/14/20 1400  ceFEPIme (MAXIPIME) 2 g in sodium chloride 0.9 % 100 mL IVPB        2 g 200 mL/hr over 30 Minutes Intravenous  Once 07/14/20 1348 07/14/20 1513   07/14/20 1400  metroNIDAZOLE (FLAGYL) IVPB 500 mg        500 mg 100 mL/hr over 60 Minutes Intravenous  Once 07/14/20 1348 07/14/20 1635   07/14/20 1400  vancomycin (VANCOCIN) IVPB 1000 mg/200 mL premix        1,000 mg 200 mL/hr over 60 Minutes Intravenous  Once 07/14/20 1348 07/14/20 1803        Objective:   Vitals:   07/15/20 1200 07/15/20 1300 07/15/20 1400 07/15/20 1500  BP: (!) 147/94 (!) 160/90 (!) 113/57 (!) 148/90  Pulse:      Resp: 15 16 13 19   Temp:  97.9 F (36.6 C)    TempSrc:  Axillary    SpO2:   100% 99%  Weight:      Height:        Wt Readings from Last 3 Encounters:  07/14/20 71.3 kg  06/11/20 65.3 kg  04/30/20 64.3 kg     Intake/Output Summary (Last 24 hours) at 07/15/2020 1514 Last  data filed at 07/15/2020 1318 Gross per 24 hour  Intake 2415.54 ml  Output 900 ml  Net 1515.54 ml     Physical Exam  Gen:- Awake Alert,  In no apparent distress  HEENT:- [place].AT, No sclera icterus Neck-Supple Neck,No JVD,.  Lungs-  CTAB , fair symmetrical air movement CV- S1, S2 normal, regular  Abd-  +ve B.Sounds, Abd Soft, No tenderness,    Extremity/Skin:- No  edema, pedal pulses present  Psych-affect is  flat,, oriented x3 Neuro-generalized weakness, no new focal deficits, no tremors   Data Review:   Micro Results Recent Results (from the past 240 hour(s))  Respiratory Panel by RT PCR (Flu A&B, Covid) - Nasopharyngeal Swab     Status: None   Collection Time: 07/14/20  1:24 PM   Specimen: Nasopharyngeal Swab  Result Value Ref Range Status   SARS Coronavirus 2 by RT PCR NEGATIVE NEGATIVE Final    Comment: (NOTE) SARS-CoV-2 target nucleic acids are NOT DETECTED.  The SARS-CoV-2 RNA is generally detectable in upper respiratoy specimens during the acute phase of infection. The lowest concentration of SARS-CoV-2 viral copies this assay can detect is 131 copies/mL. A negative result does not preclude SARS-Cov-2 infection and should not be used as the sole basis for treatment or other patient management decisions. A negative result may occur with  improper specimen collection/handling, submission of specimen other than nasopharyngeal swab, presence of viral mutation(s) within the areas targeted by this assay, and inadequate number of viral copies (<131 copies/mL). A negative result must be combined with clinical observations, patient history, and epidemiological information. The expected result is Negative.  Fact Sheet for Patients:  PinkCheek.be  Fact Sheet for Healthcare Providers:  GravelBags.it  This test is no t yet approved or cleared by the [place] FDA and  has been authorized for detection and/or  diagnosis of SARS-CoV-2 by FDA under an Emergency Use Authorization (EUA). This EUA will remain  in effect (meaning this test can be used) for the duration of the COVID-19 declaration under Section 564(b)(1) of the Act, 21 U.S.C. section 360bbb-3(b)(1), unless the authorization is terminated or revoked sooner.     Influenza A by PCR NEGATIVE NEGATIVE Final   Influenza B by PCR NEGATIVE NEGATIVE Final    Comment: (NOTE) The Xpert Xpress SARS-CoV-2/FLU/RSV assay is intended as an aid in  the diagnosis of influenza from Nasopharyngeal swab specimens and  should not be used as a sole basis for treatment. Nasal washings and  aspirates are unacceptable for Xpert Xpress SARS-CoV-2/FLU/RSV  testing.  Fact Sheet for Patients: PinkCheek.be  Fact Sheet for Healthcare Providers: GravelBags.it  This test is not yet approved or cleared by the [place] FDA and  has been authorized for detection and/or diagnosis of SARS-CoV-2 by  FDA under an Emergency Use Authorization (EUA). This EUA will remain  in effect (meaning this test can be used) for the duration of the  Covid-19 declaration under Section 564(b)(1) of the Act, 21  U.S.C. section 360bbb-3(b)(1), unless the authorization is  terminated or revoked. Performed at [facility], [address] [place], [place] [address]   Culture, blood (routine x 2)     Status: None (Preliminary result)   Collection Time: 07/14/20  2:03 PM   Specimen: BLOOD RIGHT HAND  Result Value Ref Range Status   Specimen Description   Final    BLOOD RIGHT HAND BOTTLES DRAWN AEROBIC AND ANAEROBIC   Special Requests Blood Culture adequate volume  Final   Culture   Final    NO GROWTH < 24 HOURS Performed at [facility], [address] [place], [place] [address]    Report Status PENDING  Incomplete  Culture, blood (routine x 2)     Status: None (Preliminary result)   Collection Time: 07/14/20  2:03 PM    Specimen: BLOOD LEFT HAND  Result Value Ref Range Status   Specimen Description   Final    BLOOD LEFT HAND BOTTLES DRAWN AEROBIC AND  ANAEROBIC   Special Requests Blood Culture adequate volume  Final   Culture   Final    NO GROWTH < 24 HOURS Performed at [facility], [address] [place], [place] [address]    Report Status PENDING  Incomplete  MRSA PCR Screening     Status: None   Collection Time: 07/14/20  6:34 PM   Specimen: Nasal Mucosa; Nasopharyngeal  Result Value Ref Range Status   MRSA by PCR NEGATIVE NEGATIVE Final    Comment:        The GeneXpert MRSA Assay (FDA approved for NASAL specimens only), is one component of a comprehensive MRSA colonization surveillance program. It is not intended to diagnose MRSA infection nor to guide or monitor treatment for MRSA infections. Performed at [facility], [address], [place], [place] [address]     Radiology Reports CT Head Wo Contrast  Result Date: 07/14/2020 CLINICAL DATA:  Suspected DKA, altered mental status, history of MCA infarct EXAM: CT HEAD WITHOUT CONTRAST TECHNIQUE: Contiguous axial images were obtained from the base of the skull through the vertex without intravenous contrast. 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 No clear CT evident areas of acute vascular territory infarct are present. More patchy areas of white matter hypoattenuation are most compatible with microvascular angiopathy versus other nonspecific chronic white matter disease. No evidence of acute infarction, hemorrhage, hydrocephalus, extra-axial collection, visible mass lesion or mass effect. Vascular: Age advanced atherosclerotic calcifications of the intracranial carotids and vertebral arteries. No worrisome acute hyperdense vessel. Skull:  Mild edematous changes of the scalp. No scalp swelling or hematoma. No calvarial fracture or acute or suspicious osseous abnormality. Sinuses/Orbits: Chronic left mastoid and middle ear effusion with hyperostotic changes. Additional focal thickening in the left epitympanum is nonspecific. No clear osseous erosion or destructive changes of the ossicular chain are evident within the limitations of this exam. Other: None IMPRESSION: 1. No CT evidence of acute vascular territory infarct or other acute intracranial abnormality. 2. 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 3. More patchy areas of white matter hypoattenuation are most compatible with microvascular angiopathy in this patient with history of age advanced atherosclerosis and diabetes versus other nonspecific chronic white matter disease. 4. Chronic left mastoid and middle ear effusion with hyperostotic changes. Additional focal thickening in the left epitympanum is nonspecific. Cholesteatoma not fully excluded. Could correlate with nonemergent thin-section temporal bone imaging as warranted. No clear osseous erosion or destructive changes of the ossicular chain are evident within the limitations of this exam. Electronically Signed   By: Lovena Le M.D.   On: 07/14/2020 16:56   DG Chest Portable 1 View  Result Date: 07/14/2020 CLINICAL DATA:  Altered mental status EXAM: PORTABLE CHEST 1 VIEW COMPARISON:  02/16/2018 FINDINGS: The heart size and mediastinal contours are within normal limits. Both lungs are clear. The visualized skeletal structures are unremarkable. IMPRESSION: No active disease. Electronically Signed   By: Davina Poke D.O.   On: 07/14/2020 14:11     CBC Recent Labs  Lab 07/14/20 1323 07/14/20 1324 07/14/20 1345 07/15/20 0327  WBC  --  22.8*  --  19.4*  HGB 13.3 11.0* 13.3 10.3*  HCT 39.0  37.8* 39.0 28.1*  PLT  --  372  --  328  MCV  --  101.9*  --  82.2  MCH  --  29.6  --  30.1  MCHC  --  29.1*  --  36.7*  RDW  --  13.3  --  12.4  LYMPHSABS  --  0.7  --  2.8  MONOABS  --  0.8  --  1.1*  EOSABS  --  0.0  --  0.0  BASOSABS  --  0.1  --  0.1    Chemistries  Recent Labs  Lab 07/14/20 1324 07/14/20 1345 07/14/20 1558 07/15/20 0327 07/15/20 0856  NA 110* 108* 116* 131* 135  K 8.3* 8.3* 5.9* 3.6 3.4*  CL 69* 77* 75* 91* 96*  CO2 <7*  --  9* 27 27  GLUCOSE 1,487* >700* 1,350* 319* 171*  BUN 88* 90* 87* 79* 73*  CREATININE 5.50* 5.40* 5.53* 4.82* 4.52*  CALCIUM 7.9*  --  7.6* 8.6* 8.2*  MG 2.5*  --   --  1.9  --   AST 16  --   --  24  --   ALT 17  --   --  14  --   ALKPHOS 110  --   --  84  --   BILITOT 2.3*  --   --  0.9  --    ------------------------------------------------------------------------------------------------------------------ No results for input(s): CHOL, HDL, LDLCALC, TRIG, CHOLHDL, LDLDIRECT in the last 72 hours.  Lab Results  Component Value Date   HGBA1C 11.0 (H) 07/14/2020   ------------------------------------------------------------------------------------------------------------------ Recent Labs    07/14/20 2144  TSH 0.762   ------------------------------------------------------------------------------------------------------------------ No results for input(s): VITAMINB12, FOLATE, FERRITIN, TIBC, IRON, RETICCTPCT in the last 72 hours.  Coagulation profile No results for input(s): INR, PROTIME in the last 168 hours.  No results for input(s): DDIMER in the last 72 hours.  Cardiac Enzymes No results for input(s): CKMB, TROPONINI, MYOGLOBIN in the last 168 hours.  Invalid input(s): CK ------------------------------------------------------------------------------------------------------------------ No results found for: BNP   Roxan Hockey M.D on 07/15/2020 at 3:14 PM  Go to [email or web address] - for contact info  [facility] - Office  [phone]

## 2020-07-16 ENCOUNTER — Other Ambulatory Visit: Payer: Self-pay | Admitting: *Deleted

## 2020-07-16 ENCOUNTER — Telehealth: Payer: Self-pay | Admitting: *Deleted

## 2020-07-16 DIAGNOSIS — R6510 Systemic inflammatory response syndrome (SIRS) of non-infectious origin without acute organ dysfunction: Secondary | ICD-10-CM

## 2020-07-16 DIAGNOSIS — E1065 Type 1 diabetes mellitus with hyperglycemia: Secondary | ICD-10-CM

## 2020-07-16 LAB — CBC
HCT: 27.8 % — ABNORMAL LOW (ref 39.0–52.0)
Hemoglobin: 9.5 g/dL — ABNORMAL LOW (ref 13.0–17.0)
MCH: 29.5 pg (ref 26.0–34.0)
MCHC: 34.2 g/dL (ref 30.0–36.0)
MCV: 86.3 fL (ref 80.0–100.0)
Platelets: 240 10*3/uL (ref 150–400)
RBC: 3.22 MIL/uL — ABNORMAL LOW (ref 4.22–5.81)
RDW: 12.9 % (ref 11.5–15.5)
WBC: 11.2 10*3/uL — ABNORMAL HIGH (ref 4.0–10.5)
nRBC: 0 % (ref 0.0–0.2)

## 2020-07-16 LAB — COMPREHENSIVE METABOLIC PANEL
ALT: 13 U/L (ref 0–44)
AST: 17 U/L (ref 15–41)
Albumin: 2.9 g/dL — ABNORMAL LOW (ref 3.5–5.0)
Alkaline Phosphatase: 76 U/L (ref 38–126)
Anion gap: 10 (ref 5–15)
BUN: 49 mg/dL — ABNORMAL HIGH (ref 6–20)
CO2: 26 mmol/L (ref 22–32)
Calcium: 8.2 mg/dL — ABNORMAL LOW (ref 8.9–10.3)
Chloride: 98 mmol/L (ref 98–111)
Creatinine, Ser: 3.68 mg/dL — ABNORMAL HIGH (ref 0.61–1.24)
GFR, Estimated: 21 mL/min — ABNORMAL LOW (ref >60)
Glucose, Bld: 272 mg/dL — ABNORMAL HIGH (ref 70–99)
Potassium: 3.6 mmol/L (ref 3.5–5.1)
Sodium: 134 mmol/L — ABNORMAL LOW (ref 135–145)
Total Bilirubin: 1 mg/dL (ref 0.3–1.2)
Total Protein: 6.1 g/dL — ABNORMAL LOW (ref 6.5–8.1)

## 2020-07-16 LAB — URINE CULTURE: Culture: NO GROWTH

## 2020-07-16 LAB — GLUCOSE, CAPILLARY
Glucose-Capillary: 319 mg/dL — ABNORMAL HIGH (ref 70–99)
Glucose-Capillary: 134 mg/dL — ABNORMAL HIGH (ref 70–99)
Glucose-Capillary: 176 mg/dL — ABNORMAL HIGH (ref 70–99)

## 2020-07-16 MED ORDER — FREESTYLE LIBRE 2 SENSOR MISC: 2.0000 | 3 refills | Status: DC

## 2020-07-16 MED ORDER — LABETALOL HCL 5 MG/ML IV SOLN
20.0000 mg | Freq: Once | INTRAVENOUS | Status: AC
  Administered 2020-07-16: 20 mg via INTRAVENOUS

## 2020-07-16 MED ORDER — ASPIRIN EC 81 MG PO TBEC: 81.0000 mg | DELAYED_RELEASE_TABLET | Freq: Every day | ORAL | 11 refills | Status: AC

## 2020-07-16 MED ORDER — PROMETHAZINE HCL 12.5 MG PO TABS: 12.5000 mg | ORAL_TABLET | Freq: Four times a day (QID) | ORAL | 0 refills | Status: DC | PRN

## 2020-07-16 MED ORDER — NICOTINE 21 MG/24HR TD PT24: 21.0000 mg | MEDICATED_PATCH | Freq: Every day | TRANSDERMAL | 0 refills | Status: DC

## 2020-07-16 MED ORDER — VITAMIN D (ERGOCALCIFEROL) 1.25 MG (50000 UNIT) PO CAPS
50000.0000 [IU] | ORAL_CAPSULE | ORAL | 2 refills | Status: DC
Start: 2020-07-16 — End: 2024-08-10

## 2020-07-16 MED ORDER — LABETALOL HCL 200 MG PO TABS: 200.0000 mg | ORAL_TABLET | Freq: Once | ORAL | Status: DC

## 2020-07-16 MED ORDER — PEN NEEDLES 3/16" 31G X 5 MM MISC
11 refills | Status: DC
Start: 2020-07-16 — End: 2023-01-25

## 2020-07-16 MED ORDER — LABETALOL HCL 5 MG/ML IV SOLN
10.0000 mg | INTRAVENOUS | Status: DC | PRN
  Administered 2020-07-16: 10 mg via INTRAVENOUS
  Filled 2020-07-16 (×2): qty 4

## 2020-07-16 MED ORDER — LISINOPRIL 40 MG PO TABS: 40.0000 mg | ORAL_TABLET | Freq: Every day | ORAL | 3 refills | Status: DC

## 2020-07-16 MED ORDER — AMLODIPINE BESYLATE 5 MG PO TABS
10.0000 mg | ORAL_TABLET | Freq: Every day | ORAL | Status: DC
  Administered 2020-07-16: 10 mg via ORAL
  Filled 2020-07-16: qty 2

## 2020-07-16 MED ORDER — INSULIN ASPART 100 UNIT/ML FLEXPEN
PEN_INJECTOR | SUBCUTANEOUS | 1 refills | Status: DC
Start: 2020-07-16 — End: 2022-04-23

## 2020-07-16 MED ORDER — LABETALOL HCL 100 MG PO TABS: 100.0000 mg | ORAL_TABLET | Freq: Two times a day (BID) | ORAL | 4 refills | Status: DC

## 2020-07-16 MED ORDER — AMLODIPINE BESYLATE 10 MG PO TABS: 10.0000 mg | ORAL_TABLET | Freq: Every day | ORAL | 11 refills | Status: AC

## 2020-07-16 MED ORDER — ONETOUCH ULTRA VI STRP
ORAL_STRIP | 3 refills | Status: DC
Start: 2020-07-16 — End: 2021-07-08

## 2020-07-16 MED ORDER — ATORVASTATIN CALCIUM 80 MG PO TABS: 80.0000 mg | ORAL_TABLET | Freq: Every evening | ORAL | 11 refills | Status: AC

## 2020-07-16 MED ORDER — CLOPIDOGREL BISULFATE 75 MG PO TABS: 75.0000 mg | ORAL_TABLET | Freq: Every day | ORAL | 3 refills | Status: AC

## 2020-07-16 MED ORDER — TRESIBA FLEXTOUCH 100 UNIT/ML ~~LOC~~ SOPN: PEN_INJECTOR | SUBCUTANEOUS | 1 refills | Status: DC

## 2020-07-16 MED ORDER — DULOXETINE HCL 20 MG PO CPEP: 20.0000 mg | ORAL_CAPSULE | Freq: Every day | ORAL | 2 refills | Status: DC

## 2020-07-16 NOTE — Telephone Encounter (Signed)
We can start [place] -and need Rx instructions. Please advise

## 2020-07-16 NOTE — Discharge Instructions (Signed)
1)Uncontrolled diabetes/poor medication compliance----you need to take your medications exactly as prescribed none.avoid further complications including possibility of another stroke 2) follow-up with your endocrinologist Dr. Angelina Sheriff further adjustment of your insulin regimen 3) please follow-up with your primary care physician within a week for blood pressure recheck 4)Avoid ibuprofen/Advil/Aleve/Motrin/Goody Powders/Naproxen/BC powders/Meloxicam/Diclofenac/Indomethacin and other Nonsteroidal anti-inflammatory medications as these will make you more likely to bleed and can cause stomach ulcers, can also cause Kidney problems.

## 2020-07-16 NOTE — Plan of Care (Signed)

## 2020-07-16 NOTE — Discharge Summary (Signed)
Nathaniel [name], is a 35 y.o. male  DOB 01-07-1986  MRN [id number].  Admission date:  07/14/2020  Admitting Physician  Rolla Plate, DO  Discharge Date:  07/16/2020   Primary MD  [name], MD  Recommendations for primary care physician for things to follow:   1)Uncontrolled diabetes/poor medication compliance----you need to take your medications exactly as prescribed none.avoid further complications including possibility of another stroke 2) follow-up with your endocrinologist Dr. Angelina Sheriff further adjustment of your insulin regimen 3) please follow-up with your primary care physician within a week for blood pressure recheck 4)Avoid ibuprofen/Advil/Aleve/Motrin/Goody Powders/Naproxen/BC powders/Meloxicam/Diclofenac/Indomethacin and other Nonsteroidal anti-inflammatory medications as these will make you more likely to bleed and can cause stomach ulcers, can also cause Kidney problems.   Admission Diagnosis  Hyperkalemia [E87.5] DKA (diabetic ketoacidosis) ([place]) [E11.10] Severe dehydration [E86.0] Acute renal failure, unspecified acute renal failure type ([place]) [N17.9] Altered mental status, unspecified altered mental status type [R41.82] Type 1 diabetes mellitus with ketoacidosis without coma ([place]) [E10.10]   Discharge Diagnosis  Hyperkalemia [E87.5] DKA (diabetic ketoacidosis) ([place]) [E11.10] Severe dehydration [E86.0] Acute renal failure, unspecified acute renal failure type ([place]) [N17.9] Altered mental status, unspecified altered mental status type [R41.82] Type 1 diabetes mellitus with ketoacidosis without coma (H[facility]) [E10.10]    Principal Problem:   DKA (diabetic ketoacidosis) ([place]) Active Problems:   AKI (acute kidney injury) ([place])   Acute metabolic encephalopathy due to DKA/AKI/Dehydration   Stage 3b chronic kidney disease    Essential hypertension   SIRS (systemic  inflammatory response syndrome) (H[facility])      Past Medical History:  Diagnosis Date  . Diabetes mellitus    lantus/novolog  . Hyperlipidemia   . Hypertension   . Marijuana abuse    occaisionally  . Noncompliance with medication regimen   . Stroke ([place])   . Tobacco abuse    5/day    Past Surgical History:  Procedure Laterality Date  . EYE SURGERY         HPI  from the history and physical done on the day of admission:    Nathaniel [name]  is a 34 y.o. male, 34 year old male with a history of tobacco dependence, stroke, hypertension, hyperlipidemia, diabetes mellitus type 1, medical noncompliance presents to the ER with altered mental status.  History is limited as patient is somnolent, family cannot be reached.  Chart review reveals that patient had been having trouble getting access to his medications due to insurance coverage problem.  Family reported today that patient had not gotten out of bed or done anything for a few days, and sugars have been reading greater than 600, so family bringing him into ER.  Patient's history is inconsistent today.  He reports that he has been nauseous and vomiting 2 or 3 times a day for the last few days.  He reports that he has been out of his medication for 3 days.  When asked how much insulin he takes he reported that he takes 116 units at night.  According to  the chart he takes 10 units of Tresiba at night.  Patient is on sliding scale during the day.  Patient is not able to describe his sliding scale.  He reports that he is only been out of his medications for 3 days, but it seems like his been longer than that.  Patient reports no hematemesis, no abdominal pain, no diarrhea.  He reports he is not on a diabetic diet.  In fact chart review reveals that all he had drink today was [place] fruit punch.  Patient reports his last normal bowel movement was about a week ago.  He does smoke 3 or 4 packs a day per his report.  He would like a nicotine patch.  He  does not use alcohol, or illicit drugs.  He does report he has been vaccinated for Covid.  Not think he has been experiencing polydipsia, polyuria, polyphagia.  He reports that he has not been checking his sugar 4 times daily as his endocrinologist has recommended.  Patient also reports he has not felt feverish or chilled at home.  Patient has no other complaints at this time.  In the ED Temperature 95.1, heating blanket applied, heart rate 110, respiratory rate 21, blood pressure 154/85 White blood cell count 22.8, hemoglobin 11.0 Odium of 108 that corrects 241, potassium of 8.3, chloride of 77, bicarb of less than 7, BUN of 90, creatinine 5.40, glucose 1487 Beta hydroxybutyrate is 8.00 pH is 7.06 Greater than 500 glucosuria Cefepime Vanco and Flagyl started Covid negative 3 L LR bolus CT head shows no evidence of acute vascular territory infarct or other acute intracranial abnormality Critical care consulted and reports that they will consult virtually EKG equals prolonged QT peaked T waves 1 amp of calcium and 1 amp of bicarb given Bicarb drip started Mental status improved with these treatments Blood cultures and urine cultures collected  Admission requested for further management of electrolyte derangements and DKA     Hospital Course:     Brief Summary:- 34 year old male with a history of tobacco dependence, stroke, hypertension, hyperlipidemia, diabetes mellitus type 1, medical noncompliance admitted on 07/14/2020 with DKA, AKI on CKD stage IIIb acute metabolic encephalopathy secondary to DKA and AKI  A/p  1)DKA-in a type I diabetic-- --serum glucose peaked at 1487 --Bicarb was less than 7 -Anion gap was greater than 20 -Beta hydroxybutyric acid was greater than 20 currently down to 1.9 --With IV fluids and IV insulin therapy DKA pathophysiology has resolved, bicarb and anion gap is normalized -Patient transition from IV insulin to subcu insulin -Diabetic educator  input appreciated patient remains notoriously noncompliant with diet, lifestyle and medications -Insulin therapy recommendations from patient's endocrinologist Dr. Elayne Snare noted -Plan of care discussed with patient and his mother need to be compliant with insulin therapy emphasized need to be compliant with diet and lifestyle advice emphasized -Home health RN to improve medication compliance -Outpatient follow-up with endocrinologist advised post discharge  2) pseudohyponatremia and hyperkalemia-----in the setting of severe hyperglycemia and DKA -Potassium peaked at 8.3 -Uncorrected sodium was as low as 108 -Sodium is currently normal at 135, potassium is in the 3.5 range -Normalized with hydration and replacement  3)AKI----acute kidney injury on CKD stage -3b    creatinine this admission peaked at 5.53 , baseline creatinine = around 2 from March 2020 (more recent creatinine not available)    , creatinine is now= 3.68  renally adjust medications, avoid nephrotoxic agents / dehydration  / hypotension -Overall much improved with IV  fluids -Repeat BMP with PCP within a week advised  4) acute metabolic encephalopathy--secondary to #1, #2 #3 above--- appears to have resolved  5) leukocytosis and SIRS--no evidence of acute infection at this time -This may be reactive secondary to DKA and AKI related -Vancomycin discontinued -Treated with cefepime -Urine and blood cultures NGTD -WBC currently down to 11.2 from 22.8 --No evidence of ongoing infection, no further antibiotics warranted at this time - 6) chronic anemia--multifactorial, Hgb currently around 10 despite aggressive hydration -No evidence of bleeding, continue to monitor  7) tobacco abuse--- nicotine patch is ordered  8)Depression--- Agreeable to try Cymbalta, mom will have PCP get outpatient counselling for him  Disposition--home  Disposition: The patient is from: Home Discharge home ---  Code Status :  full  Family Communication:  (patient is alert, awake and coherent)    Consults  :  PCCM   Discharge Condition: Stable  Follow UP   Follow-up Information    Elayne Snare, MD. Schedule an appointment as soon as possible for a visit in 1 week(s).   Specialty: Endocrinology Why: Insulin adjustments Contact information: [address] [place] [address] [phone]                Diet and Activity recommendation:  As advised  Discharge Instructions     Discharge Instructions    Call MD for:  difficulty breathing, headache or visual disturbances   Complete by: As directed    Call MD for:  persistant dizziness or light-headedness   Complete by: As directed    Call MD for:  persistant nausea and vomiting   Complete by: As directed    Call MD for:  severe uncontrolled pain   Complete by: As directed    Call MD for:  temperature >100.4   Complete by: As directed    Diet - low sodium heart healthy   Complete by: As directed    Diet Carb Modified   Complete by: As directed    Discharge instructions   Complete by: As directed    1)Uncontrolled diabetes/poor medication compliance----you need to take your medications exactly as prescribed none.avoid further complications including possibility of another stroke 2) follow-up with your endocrinologist Dr. Melchor Amour further adjustment of your insulin regimen 3) please follow-up with your primary care physician within a week for blood pressure recheck 4)Avoid ibuprofen/Advil/Aleve/Motrin/Goody Powders/Naproxen/BC powders/Meloxicam/Diclofenac/Indomethacin and other Nonsteroidal anti-inflammatory medications as these will make you more likely to bleed and can cause stomach ulcers, can also cause Kidney problems.   Increase activity slowly   Complete by: As directed         Discharge Medications     Allergies as of 07/16/2020      Reactions   Fructose Other (See Comments)   Increase of blood sugar       Medication List    TAKE these medications   acetaminophen 325 MG tablet Commonly known as: TYLENOL Take 1-2 tablets (325-650 mg total) by mouth every 4 (four) hours as needed for mild pain.   amLODipine 10 MG tablet Commonly known as: NORVASC Take 1 tablet (10 mg total) by mouth daily. For blood pressure What changed: additional instructions   aspirin EC 81 MG tablet Take 1 tablet (81 mg total) by mouth daily with breakfast. For stroke prevention What changed:   medication strength  how much to take  when to take this  additional instructions   atorvastatin 80 MG tablet Commonly known as: LIPITOR Take 1  tablet (80 mg total) by mouth every evening. For stroke prevention What changed:   when to take this  additional instructions   clopidogrel 75 MG tablet Commonly known as: PLAVIX Take 1 tablet (75 mg total) by mouth daily. For stroke prevention What changed: additional instructions   DULoxetine 20 MG capsule Commonly known as: Cymbalta Take 1 capsule (20 mg total) by mouth daily.   FreeStyle Libre 2 Sensor Misc 2 Devices by Does not apply route every 14 (fourteen) days.   glucagon 1 MG injection Inject 1 mg into the vein once as needed for up to 1 dose.   insulin aspart 100 UNIT/ML FlexPen Commonly known as: NOVOLOG Inject per sliding scale before mealsI: Blood sugars between 200-250 Take 2 units, 250-300 take 3 units, Over 300 Take 4 units   labetalol 100 MG tablet Commonly known as: NORMODYNE Take 1 tablet (100 mg total) by mouth 2 (two) times daily. For BP   lisinopril 40 MG tablet Commonly known as: ZESTRIL Take 1 tablet (40 mg total) by mouth daily. For blood pressure What changed: additional instructions   nicotine 21 mg/24hr patch Commonly known as: NICODERM CQ - dosed in mg/24 hours Place 1 patch (21 mg total) onto the skin daily. Start taking on: July 17, 2020   OneTouch Ultra test strip Generic drug: glucose blood USE TO TEST BLOOD  SUGAR 4 TIMES A DAY What changed: See the new instructions.   Pen Needles 3/16" 31G X 5 MM Misc Four times day   promethazine 12.5 MG tablet Commonly known as: PHENERGAN Take 1 tablet (12.5 mg total) by mouth every 6 (six) hours as needed for nausea.   [name] FlexTouch 100 UNIT/ML FlexTouch Pen Generic drug: insulin degludec Inject up to 20 units max once per day What changed: additional instructions   Vitamin D (Ergocalciferol) 1.25 MG (50000 UNIT) Caps capsule Commonly known as: DRISDOL Take 1 capsule (50,000 Units total) by mouth once a week.       Major procedures and Radiology Reports - PLEASE review detailed and final reports for all details, in brief -    CT Head Wo Contrast  Result Date: 07/14/2020 CLINICAL DATA:  Suspected DKA, altered mental status, history of MCA infarct EXAM: CT HEAD WITHOUT CONTRAST TECHNIQUE: Contiguous axial images were obtained from the base of the skull through the vertex without intravenous contrast. 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 No clear CT evident areas of acute vascular territory infarct are present. More patchy areas of white matter hypoattenuation are most compatible with microvascular angiopathy versus other nonspecific chronic white matter disease. No evidence of acute infarction, hemorrhage, hydrocephalus, extra-axial collection, visible mass lesion or mass effect. Vascular: Age advanced atherosclerotic calcifications of the intracranial carotids and vertebral arteries. No worrisome acute hyperdense vessel. Skull: Mild edematous changes of the scalp. No scalp swelling or hematoma. No calvarial fracture or acute or suspicious osseous abnormality. Sinuses/Orbits: Chronic left mastoid and middle ear effusion with hyperostotic  changes. Additional focal thickening in the left epitympanum is nonspecific. No clear osseous erosion or destructive changes of the ossicular chain are evident within the limitations of this exam. Other: None IMPRESSION: 1. No CT evidence of acute vascular territory infarct or other acute intracranial abnormality. 2. Some slightly asymmetric volume loss involving the left frontal lobe and insula as well as  sequela of prior left internal capsule infarct demonstrated on comparison imaging. 3. More patchy areas of white matter hypoattenuation are most compatible with microvascular angiopathy in this patient with history of age advanced atherosclerosis and diabetes versus other nonspecific chronic white matter disease. 4. Chronic left mastoid and middle ear effusion with hyperostotic changes. Additional focal thickening in the left epitympanum is nonspecific. Cholesteatoma not fully excluded. Could correlate with nonemergent thin-section temporal bone imaging as warranted. No clear osseous erosion or destructive changes of the ossicular chain are evident within the limitations of this exam. Electronically Signed   By: Lovena Le M.D.   On: 07/14/2020 16:56   DG Chest Portable 1 View  Result Date: 07/14/2020 CLINICAL DATA:  Altered mental status EXAM: PORTABLE CHEST 1 VIEW COMPARISON:  02/16/2018 FINDINGS: The heart size and mediastinal contours are within normal limits. Both lungs are clear. The visualized skeletal structures are unremarkable. IMPRESSION: No active disease. Electronically Signed   By: Davina Poke D.O.   On: 07/14/2020 14:11    Micro Results   Recent Results (from the past 240 hour(s))  Respiratory Panel by RT PCR (Flu A&B, Covid) - Nasopharyngeal Swab     Status: None   Collection Time: 07/14/20  1:24 PM   Specimen: Nasopharyngeal Swab  Result Value Ref Range Status   SARS Coronavirus 2 by RT PCR NEGATIVE NEGATIVE Final    Comment: (NOTE) SARS-CoV-2 target nucleic acids are NOT  DETECTED.  The SARS-CoV-2 RNA is generally detectable in upper respiratoy specimens during the acute phase of infection. The lowest concentration of SARS-CoV-2 viral copies this assay can detect is 131 copies/mL. A negative result does not preclude SARS-Cov-2 infection and should not be used as the sole basis for treatment or other patient management decisions. A negative result may occur with  improper specimen collection/handling, submission of specimen other than nasopharyngeal swab, presence of viral mutation(s) within the areas targeted by this assay, and inadequate number of viral copies (<131 copies/mL). A negative result must be combined with clinical observations, patient history, and epidemiological information. The expected result is Negative.  Fact Sheet for Patients:  PinkCheek.be  Fact Sheet for Healthcare Providers:  GravelBags.it  This test is no t yet approved or cleared by the [place] FDA and  has been authorized for detection and/or diagnosis of SARS-CoV-2 by FDA under an Emergency Use Authorization (EUA). This EUA will remain  in effect (meaning this test can be used) for the duration of the COVID-19 declaration under Section 564(b)(1) of the Act, 21 U.S.C. section 360bbb-3(b)(1), unless the authorization is terminated or revoked sooner.     Influenza A by PCR NEGATIVE NEGATIVE Final   Influenza B by PCR NEGATIVE NEGATIVE Final    Comment: (NOTE) The Xpert Xpress SARS-CoV-2/FLU/RSV assay is intended as an aid in  the diagnosis of influenza from Nasopharyngeal swab specimens and  should not be used as a sole basis for treatment. Nasal washings and  aspirates are unacceptable for Xpert Xpress SARS-CoV-2/FLU/RSV  testing.  Fact Sheet for Patients: PinkCheek.be  Fact Sheet for Healthcare Providers: GravelBags.it  This test is not yet  approved or cleared by the [place] FDA and  has been authorized for detection and/or diagnosis of SARS-CoV-2 by  FDA under an Emergency Use Authorization (EUA). This EUA will remain  in effect (meaning this test can be used) for the duration of the  Covid-19 declaration under Section 564(b)(1) of the Act, 21  U.S.C. section 360bbb-3(b)(1), unless the authorization is  terminated or revoked. Performed at [facility], [address] [place], [place] [address]   Urine culture     Status: None   Collection Time: 07/14/20  1:41 PM   Specimen: Urine, Catheterized  Result Value Ref Range Status   Specimen Description   Final    URINE, CATHETERIZED Performed at [facility], [address], [place], [place] [address]    Special Requests   Final    NONE Performed at [facility], [address], [place], [place] [address]    Culture   Final    NO GROWTH Performed at [facility], [place] [address], [place], [place] [address]    Report Status 07/16/2020 FINAL  Final  Culture, blood (routine x 2)     Status: None (Preliminary result)   Collection Time: 07/14/20  2:03 PM   Specimen: BLOOD RIGHT HAND  Result Value Ref Range Status   Specimen Description   Final    BLOOD RIGHT HAND BOTTLES DRAWN AEROBIC AND ANAEROBIC   Special Requests Blood Culture adequate volume  Final   Culture   Final    NO GROWTH 2 DAYS Performed at [facility], [address], [place], [place] [address]    Report Status PENDING  Incomplete  Culture, blood (routine x 2)     Status: None (Preliminary result)   Collection Time: 07/14/20  2:03 PM   Specimen: BLOOD LEFT HAND  Result Value Ref Range Status   Specimen Description   Final    BLOOD LEFT HAND BOTTLES DRAWN AEROBIC AND ANAEROBIC   Special Requests Blood Culture adequate volume  Final   Culture   Final    NO GROWTH 2 DAYS Performed at [facility], [address], [place], [place] [address]    Report Status PENDING  Incomplete  MRSA PCR  Screening     Status: None   Collection Time: 07/14/20  6:34 PM   Specimen: Nasal Mucosa; Nasopharyngeal  Result Value Ref Range Status   MRSA by PCR NEGATIVE NEGATIVE Final    Comment:        The GeneXpert MRSA Assay (FDA approved for NASAL specimens only), is one component of a comprehensive MRSA colonization surveillance program. It is not intended to diagnose MRSA infection nor to guide or monitor treatment for MRSA infections. Performed at [facility], [address], [place], [place] [address]     Today   Subjective    Nathaniel [name] today has no new complaints  No Nausea, Vomiting or Diarrhea  No fever  Or chills          Patient has been seen and examined prior to discharge   Objective   Blood pressure (!) 174/117, pulse (!) 109, temperature 98.6 F (37 C), temperature source Oral, resp. rate 17, height 5\' 8"  (1.727 m), weight 71.3 kg, SpO2 99 %.   Intake/Output Summary (Last 24 hours) at 07/16/2020 1329 Last data filed at 07/16/2020 0734 Gross per 24 hour  Intake 1676.93 ml  Output 850 ml  Net 826.93 ml   Exam Gen:- Awake Alert, no acute distress  HEENT:- [place].AT, No sclera icterus Neck-Supple Neck,No JVD,.  Lungs-  CTAB , good air movement bilaterally  CV- S1, S2 normal, regular Abd-  +ve B.Sounds, Abd Soft, No tenderness,    Extremity/Skin:- No  edema,   good pulses Psych-affect is appropriate, oriented x3 Neuro-no new focal deficits, no tremors    Data Review   CBC w Diff:  Lab Results  Component Value Date   WBC 11.2 (H) 07/16/2020   HGB 9.5 (L) 07/16/2020   HCT 27.8 (L) 07/16/2020   HCT 32.8 (L) 04/15/2016   PLT 240 07/16/2020   LYMPHOPCT 15 07/15/2020   MONOPCT 6 07/15/2020   EOSPCT 0 07/15/2020   BASOPCT 0 07/15/2020    CMP:  Lab Results  Component Value Date   NA 134 (L) 07/16/2020   K 3.6 07/16/2020   CL 98 07/16/2020   CO2 26 07/16/2020   BUN 49 (H) 07/16/2020   CREATININE 3.68 (H) 07/16/2020   PROT 6.1 (L)  07/16/2020   ALBUMIN 2.9 (L) 07/16/2020   BILITOT 1.0 07/16/2020   ALKPHOS 76 07/16/2020   AST 17 07/16/2020   ALT 13 07/16/2020  . Total Discharge time is about 33 minutes  Roxan Hockey M.D on 07/16/2020 at 1:29 PM  Go to [email or web address] -  for contact info  [facility] - Office  [phone]

## 2020-07-16 NOTE — Telephone Encounter (Signed)
Pl send, is this to [place]?

## 2020-07-16 NOTE — Progress Notes (Signed)
Inpatient Diabetes Program Recommendations  AACE/ADA: New Consensus Statement on Inpatient Glycemic Control (2015)  Target Ranges:  Prepandial:   less than 140 mg/dL      Peak postprandial:   less than 180 mg/dL (1-2 hours)      Critically ill patients:  140 - 180 mg/dL   Lab Results  Component Value Date   GLUCAP 134 (H) 07/16/2020   HGBA1C 11.0 (H) 07/14/2020    Review of Glycemic Control  Diabetes history: DM type 1 Outpatient Diabetes medications: Tresiba 20 units, Novolog 200-250: 2 units, 250-300: 3 units, >300: 4 units Current orders for Inpatient glycemic control:  Lantus 15 units Novolog 0-15 units tid + hs Novolog 3 units tid  A1c 11% habitually high, sees Dr. Dwyane Dee, Endocrinology.   Pt has follow up and insulin at home. Dr. Dwyane Dee sending prescription for dexcom CGM and outpatient education already ordered. Per Estill Bamberg , pt's RN today pt being d/c'd today and mom has been spoken to regarding better DM self management and care at home.   Thanks,  Tama Headings RN, MSN, BC-ADM Inpatient Diabetes Coordinator Team Pager [phone] (8a-5p)

## 2020-07-16 NOTE — Telephone Encounter (Signed)
Pt mother called and stated--called Dexcom and needed Rx send [phone]

## 2020-07-16 NOTE — Telephone Encounter (Signed)
He will need a Dexcom receiver, transmitter and 3 sensors per month

## 2020-07-17 ENCOUNTER — Other Ambulatory Visit: Payer: Self-pay | Admitting: *Deleted

## 2020-07-17 MED ORDER — DEXCOM G6 TRANSMITTER MISC: 0 refills | Status: DC

## 2020-07-17 MED ORDER — DEXCOM G6 SENSOR MISC: 3 refills | Status: DC

## 2020-07-17 MED ORDER — DEXCOM G6 RECEIVER DEVI: 0 refills | Status: DC

## 2020-07-18 ENCOUNTER — Other Ambulatory Visit: Payer: Self-pay | Admitting: *Deleted

## 2020-07-18 DIAGNOSIS — E1021 Type 1 diabetes mellitus with diabetic nephropathy: Secondary | ICD-10-CM

## 2020-07-18 MED ORDER — DEXCOM G6 RECEIVER DEVI: 0 refills | Status: DC

## 2020-07-18 MED ORDER — FREESTYLE LIBRE 2 SENSOR MISC: 2.0000 | 3 refills | Status: DC

## 2020-07-18 MED ORDER — DEXCOM G6 TRANSMITTER MISC: 0 refills | Status: DC

## 2020-07-18 MED ORDER — DEXCOM G6 SENSOR MISC
1.0000 | 3 refills | Status: DC
Start: 2020-07-18 — End: 2020-07-22

## 2020-07-18 MED ORDER — DEXCOM G6 TRANSMITTER MISC: 3 refills | Status: DC

## 2020-07-18 NOTE — Telephone Encounter (Signed)
Faxed Rx Dexcom--transmission, sensor, and receiver to--[facility] [phone]

## 2020-07-19 LAB — CULTURE, BLOOD (ROUTINE X 2)
Culture: NO GROWTH
Culture: NO GROWTH
Special Requests: ADEQUATE
Special Requests: ADEQUATE

## 2020-07-22 ENCOUNTER — Encounter: Payer: Self-pay | Admitting: Endocrinology

## 2020-07-22 ENCOUNTER — Other Ambulatory Visit: Payer: Self-pay

## 2020-07-22 ENCOUNTER — Ambulatory Visit (INDEPENDENT_AMBULATORY_CARE_PROVIDER_SITE_OTHER): Payer: Medicare HMO | Admitting: Endocrinology

## 2020-07-22 VITALS — BP 128/78 | HR 102 | Ht 74.0 in

## 2020-07-22 DIAGNOSIS — Z23 Encounter for immunization: Secondary | ICD-10-CM | POA: Diagnosis not present

## 2020-07-22 DIAGNOSIS — E1065 Type 1 diabetes mellitus with hyperglycemia: Secondary | ICD-10-CM | POA: Diagnosis not present

## 2020-07-22 DIAGNOSIS — E1021 Type 1 diabetes mellitus with diabetic nephropathy: Secondary | ICD-10-CM

## 2020-07-22 MED ORDER — DEXCOM G6 SENSOR MISC
1.0000 | 3 refills | Status: DC
Start: 2020-07-22 — End: 2020-07-25

## 2020-07-22 MED ORDER — DEXCOM G6 RECEIVER DEVI: 0 refills | Status: DC

## 2020-07-22 MED ORDER — DEXCOM G6 TRANSMITTER MISC: 3 refills | Status: DC

## 2020-07-22 NOTE — Patient Instructions (Signed)
Tresiba 14 units daily  MUST CHECK SUGARS BEFORE AND 2 HRS AFTER MEALS  LIMIT juices to 4 oz

## 2020-07-22 NOTE — Progress Notes (Addendum)
Patient ID: Nathaniel [name], male   DOB: 11/09/1985, 34 y.o.   MRN: [id number]           Reason for Appointment : Follow-up for Type 1 Diabetes  History of Present Illness           Diagnosis: Type 1 diabetes mellitus, date of diagnosis:  ?  2004        Previous history:  He has had recurrent hospitalizations for ketoacidosis especially in 2017 and 2018, last episode was in 01/2018 Overall has had consistently poor control with A1c as high as 16 In the past he has required large doses of at least basal insulin, taking up to 200 units of Lantus daily at some point  Recent history:     INSULIN regimen is: Novolog mostly 4 units before meals  Tresiba 10 units daily  His A1c is recently 11  Current management, blood sugar patterns and problems identified:    Glucose monitoring:  He was told to look into the Dexcom in September but still has not had the details worked out  His mother says that apparently he was taking arbitrary doses of Tresiba when his sugars were high and may have taken 100 units on one day causing his blood sugar to be reading 28  On the other day he was going to take 28 units arbitrarily also and his mother has since then been supervising him  Because of taking larger doses he ran out of the [name] and ended up in ketoacidosis at the hospital  Since his discharge his blood sugars in the mornings appear to be getting progressively higher  As before he does not check his blood sugars after meals and mostly when he wakes up which is at various times  He is generally taking 4 units for most of his meals without checking postprandial reading  Also appears to be having decreased appetite and reportedly with weight loss  He is not communicating much today and most of the history was obtained from his mother  He says he is trying to avoid a lot of juices although before his hospital admission may have been going off his diet and eating more snacks at night and  sugar drinks  Currently drinking mostly water   Glucose monitoring:  is being done mostly 1 times a day         Glucometer:  One Touch ultra 2       Blood Glucose readings as from meter download  Blood sugar range 28-600+, usually checks once a day at different times and recent median reading 341  Previous data:   PRE-MEAL Fasting  midday  2-5 PM  5-10 PM Overall  Glucose range:   75-600+  54-256  49-600+  49-600+  Mean/median:    155   236+/-196   POST-MEAL PC Breakfast PC Lunch PC Dinner  Glucose range:   ?  Mean/median:         PREVIOUS CGM: Dates of Recording: 1/6 through 1/19  Sensor description: [employer] professional   CGM use % of time  100  Average and SD  283, GV 58  Time in range    18    %  % Time Above 180  11  % Time above 250  56  % Time Below target  15    Glycemic patterns summary: Blood sugars are showing extreme variability at all times HIGHEST blood sugars are late at night rising after about 9 PM and  not starting to come down to at least 7 AM He has frequent hypoglycemia on about half of the days studied with more frequent hypoglycemia between 1 PM and 6 PM Average blood sugar on any given day varies between 130 and 431  Hypoglycemia:  As above Symptoms of hypoglycemia: Feeling tired, sweaty, slurred speech, weakness Treatment of hypoglycemia: Juice usually, sometimes glucose tablets, usually 2 at a time     Self-care: The diet that the patient has been following is: None  Mealtimes are: Very variable              Dietician consultation: Most recent: 06/2018     CDE consultation: Years ago  Diabetes labs:  Lab Results  Component Value Date   HGBA1C 11.0 (H) 07/14/2020   HGBA1C 10.3 (A) 03/20/2020   HGBA1C 11.0 (A) 09/25/2019   Lab Results  Component Value Date   LDLCALC 128 (H) 03/19/2020   CREATININE 3.68 (H) 07/16/2020    No results found for: MICRALBCREAT  Wt Readings from Last 3 Encounters:  07/14/20 157 lb 3 oz  (71.3 kg)  06/11/20 144 lb (65.3 kg)  04/30/20 141 lb 12.8 oz (64.3 kg)     Allergies as of 07/22/2020      Reactions   Fructose Other (See Comments)   Increase of blood sugar      Medication List       Accurate as of July 22, 2020  8:56 PM. If you have any questions, ask your nurse or doctor.        acetaminophen 325 MG tablet Commonly known as: TYLENOL Take 1-2 tablets (325-650 mg total) by mouth every 4 (four) hours as needed for mild pain.   amLODipine 10 MG tablet Commonly known as: NORVASC Take 1 tablet (10 mg total) by mouth daily. For blood pressure   aspirin EC 81 MG tablet Take 1 tablet (81 mg total) by mouth daily with breakfast. For stroke prevention   atorvastatin 80 MG tablet Commonly known as: LIPITOR Take 1 tablet (80 mg total) by mouth every evening. For stroke prevention   clopidogrel 75 MG tablet Commonly known as: PLAVIX Take 1 tablet (75 mg total) by mouth daily. For stroke prevention   Dexcom G6 Receiver Devi Check sugar 4 times a day   Dexcom G6 Sensor Misc 1 each by Does not apply route every 14 (fourteen) days. What changed: Another medication with the same name was removed. Continue taking this medication, and follow the directions you see here. Changed by: Elayne Snare, MD   Dexcom G6 Transmitter Misc Check sugar 4 times a day   DULoxetine 20 MG capsule Commonly known as: Cymbalta Take 1 capsule (20 mg total) by mouth daily.   glucagon 1 MG injection Inject 1 mg into the vein once as needed for up to 1 dose.   insulin aspart 100 UNIT/ML FlexPen Commonly known as: NOVOLOG Inject per sliding scale before mealsI: Blood sugars between 200-250 Take 2 units, 250-300 take 3 units, Over 300 Take 4 units   labetalol 100 MG tablet Commonly known as: NORMODYNE Take 1 tablet (100 mg total) by mouth 2 (two) times daily. For BP   lisinopril 40 MG tablet Commonly known as: ZESTRIL Take 1 tablet (40 mg total) by mouth daily. For blood  pressure   nicotine 21 mg/24hr patch Commonly known as: NICODERM CQ - dosed in mg/24 hours Place 1 patch (21 mg total) onto the skin daily.   OneTouch Ultra test strip Generic drug:  glucose blood USE TO TEST BLOOD SUGAR 4 TIMES A DAY   Pen Needles 3/16" 31G X 5 MM Misc Four times day   promethazine 12.5 MG tablet Commonly known as: PHENERGAN Take 1 tablet (12.5 mg total) by mouth every 6 (six) hours as needed for nausea.   [name] FlexTouch 100 UNIT/ML FlexTouch Pen Generic drug: insulin degludec Inject up to 20 units max once per day   Vitamin D (Ergocalciferol) 1.25 MG (50000 UNIT) Caps capsule Commonly known as: DRISDOL Take 1 capsule (50,000 Units total) by mouth once a week.       Allergies:  Allergies  Allergen Reactions  . Fructose Other (See Comments)    Increase of blood sugar     Past Medical History:  Diagnosis Date  . Diabetes mellitus    lantus/novolog  . Hyperlipidemia   . Hypertension   . Marijuana abuse    occaisionally  . Noncompliance with medication regimen   . Stroke ([place])   . Tobacco abuse    5/day    Past Surgical History:  Procedure Laterality Date  . EYE SURGERY      Family History  Problem Relation Age of Onset  . Diabetes Father   . Kidney failure Father        last 4 mnths of life  . Hypertension Mother   . Diabetes Maternal Grandmother   . Pancreatic cancer Maternal Grandfather   . Diabetes Paternal Grandfather     Social History:  reports that he has been smoking cigarettes. He has a 4.00 pack-year smoking history. He has never used smokeless tobacco. He reports current drug use. Drug: Marijuana. He reports that he does not drink alcohol.      Review of Systems      Lipids: After his CVA in June 2019 he was started on Lipitor 80 mg, last LDL as follows    Lab Results  Component Value Date   CHOL 198 03/19/2020   HDL 38.20 (L) 03/19/2020   LDLCALC 128 (H) 03/19/2020   TRIG 162.0 (H) 03/19/2020   CHOLHDL 5  03/19/2020   HYPERTENSION: Has been on treatment for several years from nephrologist  Blood pressure medications including lisinopril 40 mg, and amlodipine are being prescribed by his PCP   BP Readings from Last 3 Encounters:  07/22/20 128/78  07/16/20 (!) 147/98  06/11/20 120/84    Has chronic kidney disease, seen by nephrologist regularly including last month  Lab Results  Component Value Date   CREATININE 3.68 (H) 07/16/2020   CREATININE 4.52 (H) 07/15/2020   CREATININE 4.82 (H) 07/15/2020   He has had a thyroid nodule on the right side, ultrasound has been ordered a couple of times but he has not gone for this  Lab Results  Component Value Date   TSH 0.762 07/14/2020      Physical Examination:  BP 128/78   Pulse (!) 102   Ht 6\' 2"  (1.88 m)   SpO2 99%   BMI 20.18 kg/m     ASSESSMENT:  Diabetes type 1, persistently poorly controlled  A1c is persistently high, last 11%  Difficult to establish his insulin doses as he is checking blood sugars erratically and not clear if he is taking a consistent amount of Tresiba Seems to be needing more basal insulin even though he is eating less and losing weight Unlikely that he is taking NovoLog regularly and again difficult to adjust his mealtime dose without any postprandial readings  As discussed before he will  benefit from the [facility] sensor for better reliability, alerting him to higher need low readings and enabling the adjustment of mealtime dose  Currently patient is having decreased appetite and unclear whether this is from worsening renal function  PLAN:    For now we will go up to 14 units of Tresiba since fasting readings appear to be trending progressively higher If blood sugars in the mornings start getting below 100 he can cut it down to 12 units  Again reminded him to check her blood sugar 2 hours after his main meal to help adjust his NovoLog Prescription will be sent to [place] to get his Dexcom  and start using this after any necessary training  He can use Glucerna to supplement his intake Avoid drinking more than 4 ounces juice at a time  Flu vaccine given  Patient Instructions  Tresiba 14 units daily  MUST CHECK SUGARS BEFORE AND 2 HRS AFTER MEALS  LIMIT juices to 4 oz      Elayne Snare 07/22/2020, 8:56 PM      Note: This note was prepared with Dragon voice recognition system technology. Any transcriptional errors that result from this process are unintentional.

## 2020-07-23 NOTE — Telephone Encounter (Signed)
RX was sent in yesterday

## 2020-07-25 ENCOUNTER — Other Ambulatory Visit: Payer: Self-pay | Admitting: *Deleted

## 2020-07-25 DIAGNOSIS — E1021 Type 1 diabetes mellitus with diabetic nephropathy: Secondary | ICD-10-CM

## 2020-07-25 MED ORDER — DEXCOM G6 SENSOR MISC
1.0000 | 3 refills | Status: DC
Start: 2020-07-25 — End: 2021-07-08

## 2020-07-25 MED ORDER — DEXCOM G6 TRANSMITTER MISC: 3 refills | Status: DC

## 2020-07-25 MED ORDER — DEXCOM G6 RECEIVER DEVI: 0 refills | Status: DC

## 2020-08-19 ENCOUNTER — Ambulatory Visit: Payer: Medicare HMO | Admitting: Endocrinology

## 2020-11-11 ENCOUNTER — Encounter: Payer: Self-pay | Admitting: Endocrinology

## 2020-11-11 ENCOUNTER — Encounter: Payer: Medicare HMO | Attending: Endocrinology | Admitting: Nutrition

## 2020-11-11 ENCOUNTER — Ambulatory Visit (INDEPENDENT_AMBULATORY_CARE_PROVIDER_SITE_OTHER): Payer: Medicare HMO | Admitting: Endocrinology

## 2020-11-11 ENCOUNTER — Other Ambulatory Visit: Payer: Self-pay

## 2020-11-11 VITALS — BP 132/86 | HR 101 | Ht 71.0 in | Wt 139.0 lb

## 2020-11-11 DIAGNOSIS — E1065 Type 1 diabetes mellitus with hyperglycemia: Secondary | ICD-10-CM | POA: Diagnosis not present

## 2020-11-11 DIAGNOSIS — E46 Unspecified protein-calorie malnutrition: Secondary | ICD-10-CM | POA: Diagnosis not present

## 2020-11-11 DIAGNOSIS — E7800 Pure hypercholesterolemia, unspecified: Secondary | ICD-10-CM | POA: Diagnosis not present

## 2020-11-11 DIAGNOSIS — E108 Type 1 diabetes mellitus with unspecified complications: Secondary | ICD-10-CM | POA: Diagnosis not present

## 2020-11-11 LAB — POCT GLYCOSYLATED HEMOGLOBIN (HGB A1C): Hemoglobin A1C: 11.9 % — AB (ref 4.0–5.6)

## 2020-11-11 NOTE — Patient Instructions (Addendum)
No sweet tea AT ALL  TRESIBA 18 UNITS DAILY  Sugar should go up 50-100 after meals  Atorvastatin DAILY

## 2020-11-11 NOTE — Progress Notes (Signed)
Patient ID: Nathaniel [name], male   DOB: 01/15/1986, 35 y.o.   MRN: [id number]           Reason for Appointment : Follow-up for Type 1 Diabetes  History of Present Illness           Diagnosis: Type 1 diabetes mellitus, date of diagnosis:  ?  2004        Previous history:  He has had recurrent hospitalizations for ketoacidosis especially in 2017 and 2018, last episode was in 01/2018 Overall has had consistently poor control with A1c as high as 16 In the past he has required large doses of at least basal insulin, taking up to 200 units of Lantus daily at some point  Recent history:     INSULIN regimen is: Novolog 5 to 10 units before meals Tresiba 14-16 units daily  His A1c is now 11.9, previously 11  Current management, blood sugar patterns and problems identified:    Glucose monitoring:  He was able to get the Dexcom in January but did not call to start the training  He has been taking 14 to 16 units of Tresiba and sometimes will take 20 units if the blood sugar is over 500  However every few days his blood sugar will be low normal or low  Not clear if he is checking any blood sugars after meals, recently his first reading is about 1-2 PM when he wakes up around again early evenings likely when he is eating a meal  As before he does not get symptoms of low blood sugars  Mostly eating 1 meal a day which is variable in quantity and carbohydrate  He is generally taking NOVOLOG 5-10 units for most of his meals based on his [place] blood sugar and meal size, is now getting some help from his mother to adjust the doses  However mostly eating 1 meal sometime in the evening  Also is getting extra insulin, total up to 20 units when he is drinking sweet tea  He is not drinking as much juice as before  His weight has gone down and he is generally not having much appetite   Glucose monitoring:  is being done mostly 1 times a day         Glucometer:  One Touch ultra 2        Blood Glucose readings and averages from meter download   PRE-MEAL  11 AM-5 PM  5 PM-10 PM  10 PM Bedtime Overall  Glucose range:  56-600+  67-600+  293  56=Hi  Median:  260  350    330    PREVIOUS CGM: Dates of Recording: 1/6 through 1/19  Sensor description: [employer] professional   CGM use % of time  100  Average and SD  283, GV 58  Time in range    18    %  % Time Above 180  11  % Time above 250  56  % Time Below target  15    Glycemic patterns summary: Blood sugars are showing extreme variability at all times HIGHEST blood sugars are late at night rising after about 9 PM and not starting to come down to at least 7 AM He has frequent hypoglycemia on about half of the days studied with more frequent hypoglycemia between 1 PM and 6 PM Average blood sugar on any given day varies between 130 and 431  Hypoglycemia:  As above Symptoms of hypoglycemia: Feeling tired, sweaty, slurred speech,  weakness Treatment of hypoglycemia: Juice usually, sometimes glucose tablets, usually 2 at a time     Self-care: The diet that the patient has been following is: None  Mealtimes are: Very variable              Dietician consultation: Most recent: 06/2018     CDE consultation: Years ago  Diabetes labs:  Lab Results  Component Value Date   HGBA1C 11.9 (A) 11/11/2020   HGBA1C 11.0 (H) 07/14/2020   HGBA1C 10.3 (A) 03/20/2020   Lab Results  Component Value Date   LDLCALC 128 (H) 03/19/2020   CREATININE 3.68 (H) 07/16/2020    No results found for: MICRALBCREAT  Wt Readings from Last 3 Encounters:  11/11/20 139 lb (63 kg)  07/14/20 157 lb 3 oz (71.3 kg)  06/11/20 144 lb (65.3 kg)     Allergies as of 11/11/2020      Reactions   Fructose Other (See Comments)   Increase of blood sugar      Medication List       Accurate as of November 11, 2020  8:56 PM. If you have any questions, ask your nurse or doctor.        acetaminophen 325 MG tablet Commonly known as:  TYLENOL Take 1-2 tablets (325-650 mg total) by mouth every 4 (four) hours as needed for mild pain.   amLODipine 10 MG tablet Commonly known as: NORVASC Take 1 tablet (10 mg total) by mouth daily. For blood pressure   aspirin EC 81 MG tablet Take 1 tablet (81 mg total) by mouth daily with breakfast. For stroke prevention   atorvastatin 80 MG tablet Commonly known as: LIPITOR Take 1 tablet (80 mg total) by mouth every evening. For stroke prevention   clopidogrel 75 MG tablet Commonly known as: PLAVIX Take 1 tablet (75 mg total) by mouth daily. For stroke prevention   Dexcom G6 Receiver Devi Check sugar 4 times a day   Dexcom G6 Sensor Misc 1 each by Does not apply route every 14 (fourteen) days.   Dexcom G6 Transmitter Misc Check sugar 4 times a day   DULoxetine 20 MG capsule Commonly known as: Cymbalta Take 1 capsule (20 mg total) by mouth daily.   glucagon 1 MG injection Inject 1 mg into the vein once as needed for up to 1 dose.   insulin aspart 100 UNIT/ML FlexPen Commonly known as: NOVOLOG Inject per sliding scale before mealsI: Blood sugars between 200-250 Take 2 units, 250-300 take 3 units, Over 300 Take 4 units   labetalol 100 MG tablet Commonly known as: NORMODYNE Take 1 tablet (100 mg total) by mouth 2 (two) times daily. For BP   lisinopril 40 MG tablet Commonly known as: ZESTRIL Take 1 tablet (40 mg total) by mouth daily. For blood pressure   nicotine 21 mg/24hr patch Commonly known as: NICODERM CQ - dosed in mg/24 hours Place 1 patch (21 mg total) onto the skin daily.   OneTouch Ultra test strip Generic drug: glucose blood USE TO TEST BLOOD SUGAR 4 TIMES A DAY   Pen Needles 3/16" 31G X 5 MM Misc Four times day   promethazine 12.5 MG tablet Commonly known as: PHENERGAN Take 1 tablet (12.5 mg total) by mouth every 6 (six) hours as needed for nausea.   [name] FlexTouch 100 UNIT/ML FlexTouch Pen Generic drug: insulin degludec Inject up to 20 units  max once per day   Vitamin D (Ergocalciferol) 1.25 MG (50000 UNIT) Caps capsule Commonly known  as: DRISDOL Take 1 capsule (50,000 Units total) by mouth once a week.       Allergies:  Allergies  Allergen Reactions  . Fructose Other (See Comments)    Increase of blood sugar     Past Medical History:  Diagnosis Date  . Diabetes mellitus    lantus/novolog  . Hyperlipidemia   . Hypertension   . Marijuana abuse    occaisionally  . Noncompliance with medication regimen   . Stroke ([place])   . Tobacco abuse    5/day    Past Surgical History:  Procedure Laterality Date  . EYE SURGERY      Family History  Problem Relation Age of Onset  . Diabetes Father   . Kidney failure Father        last 4 mnths of life  . Hypertension Mother   . Diabetes Maternal Grandmother   . Pancreatic cancer Maternal Grandfather   . Diabetes Paternal Grandfather     Social History:  reports that he has been smoking cigarettes. He has a 4.00 pack-year smoking history. He has never used smokeless tobacco. He reports current drug use. Drug: Marijuana. He reports that he does not drink alcohol.      Review of Systems      Lipids: After his CVA in June 2019 he was started on Lipitor 80 mg, last LDL as follows   Lab Results  Component Value Date   CHOL 198 03/19/2020   HDL 38.20 (L) 03/19/2020   LDLCALC 128 (H) 03/19/2020   TRIG 162.0 (H) 03/19/2020   CHOLHDL 5 03/19/2020   HYPERTENSION: Has been on treatment for several years from nephrologist  Blood pressure medications including lisinopril 40 mg, and amlodipine are being prescribed by his PCP   BP Readings from Last 3 Encounters:  11/11/20 132/86  07/22/20 128/78  07/16/20 (!) 147/98    Has chronic kidney disease, seen by nephrologist regularly including last month  Lab Results  Component Value Date   CREATININE 3.68 (H) 07/16/2020   CREATININE 4.52 (H) 07/15/2020   CREATININE 4.82 (H) 07/15/2020   He has had a thyroid  nodule on the right side, ultrasound has been ordered a couple of times but he has not gone for this  Lab Results  Component Value Date   TSH 0.762 07/14/2020   Eye exams: Next due on 12/03/2020   Physical Examination:  BP 132/86   Pulse (!) 101   Ht '5\' 11"'$  (1.803 m)   Wt 139 lb (63 kg)   SpO2 92%   BMI 19.39 kg/m   He appears asthenic  ASSESSMENT:  Diabetes type 1, persistently poorly controlled  A1c is persistently high, now 11.9 compared to 11%  He still has overall extremely high readings when checked at home averaging over 300 However difficult to establish his basal insulin requirement as he has periodic low blood sugars although none in the last week or so Over the last 30 days has 6 blood sugars before meals below 70 out of 33  Some of his low sugar may be related to stacking of his basal insulin with concomitant renal dysfunction since he takes as much as 20 units occasionally instead of the usual 14-16  Also unclear what his postprandial readings are His meal size and type of meal as well as mealtimes are quite variable and difficult to adjust his mealtime dose Difficult to establish his insulin doses as he is checking blood sugars erratically and not clear if he is  taking a consistent amount of Tresiba Seems to be needing more basal insulin even though he is eating less and losing weight Unlikely that he is taking NovoLog regularly and again difficult to adjust his mealtime dose without any postprandial readings  He now has the Dexcom sensor available and he will benefit from the continuous glucose monitoring with better adjustment of his basal and bolus insulin regimens Also likely may benefit from nutritional counseling especially with his weight loss which may be related to advanced renal failure  HISTORY of hypercholesterolemia: He is not regular with his Lipitor and does not understand the importance of taking this for cardiovascular risk reduction and history  of stroke  PLAN:   He will be instructed by nurse educator to start Dexcom sensor; also will try to use his smart phone to monitor the blood sugar as well as linked to his mother's cell phone  Basal insulin will be increased to 18 units daily and not reduce it unless blood sugars are below 100 fasting  He will adjust the dose every 3 days by 2 units only if blood sugars are consistently over 200    He can use Glucerna or diabetic boost to supplement his nutritional needs  Also he can discuss any vitamin supplements with nephrologist  Discussed importance of avoiding sweet tea completely and may use artificial sweeteners  Will need to further adjust his NovoLog based on his readings 1 to 2 hours after meals and target should be to keep the rise in blood sugar at least under 100 mg  To take ATORVASTATIN daily as he has no known side effects  To check lipids on the next visit  Follow-up in 2 months   Patient Instructions  No sweet tea AT ALL  TRESIBA 18 UNITS DAILY  Sugar should go up 50-100 after meals  Atorvastatin DAILY     Elayne Snare 11/11/2020, 8:56 PM      Note: This note was prepared with Dragon voice recognition system technology. Any transcriptional errors that result from this process are unintentional.

## 2020-11-12 NOTE — Patient Instructions (Signed)
REad over Dexcom directions for use Call Dexcom help line if need assistance or if sensor falls off.

## 2020-11-12 NOTE — Progress Notes (Signed)
Patient is here with his mother to learn how to use the Dexcom CGM.  We discussed the difference between CGM and Blood sugar testing, and when the need arises to continue with SBGM.  He reported good understanding of this. He inserted the sensor into his left arm and we attached the transmitter to the sensor. The readings well go to the reader, since his phone is not capable of downloading the app.  He also does not have a computer at home to sent the readings to the clinic.  He was encouraged to to bring the reader to each visit with Dr. Dwyane Dee. He was also encouraged to read over the manual when he gets home today, and he agreed to do this.  They had no final questions.

## 2020-12-03 ENCOUNTER — Encounter (INDEPENDENT_AMBULATORY_CARE_PROVIDER_SITE_OTHER): Payer: Self-pay | Admitting: Ophthalmology

## 2020-12-03 ENCOUNTER — Other Ambulatory Visit: Payer: Self-pay

## 2020-12-03 ENCOUNTER — Ambulatory Visit (INDEPENDENT_AMBULATORY_CARE_PROVIDER_SITE_OTHER): Payer: Medicare HMO | Admitting: Ophthalmology

## 2020-12-03 DIAGNOSIS — E103552 Type 1 diabetes mellitus with stable proliferative diabetic retinopathy, left eye: Secondary | ICD-10-CM

## 2020-12-03 DIAGNOSIS — E103591 Type 1 diabetes mellitus with proliferative diabetic retinopathy without macular edema, right eye: Secondary | ICD-10-CM | POA: Diagnosis not present

## 2020-12-03 DIAGNOSIS — E103512 Type 1 diabetes mellitus with proliferative diabetic retinopathy with macular edema, left eye: Secondary | ICD-10-CM | POA: Diagnosis not present

## 2020-12-03 NOTE — Assessment & Plan Note (Signed)
PRP does cover about two thirds of the peripheral retina.  Signs of peripheral neovascularization inferiorly will probably need completion of PRP

## 2020-12-03 NOTE — Assessment & Plan Note (Signed)

## 2020-12-03 NOTE — Progress Notes (Signed)
12/03/2020     CHIEF COMPLAINT Patient presents for Retina Follow Up (6 Month diabetic f\u OU. PRP OD. FP/Pt states vision is doing well. Denies new floaters and FOL./BGL:did not check/A1C: 11 in Nov)   HISTORY OF PRESENT ILLNESS: Nathaniel [name] is a 35 y.o. male who presents to the clinic today for:   HPI    Retina Follow Up    Patient presents with  Diabetic Retinopathy.  In both eyes.  Severity is moderate.  Duration of 6 months.  Since onset it is stable.  I, the attending physician,  performed the HPI with the patient and updated documentation appropriately. Additional comments: 6 Month diabetic f\u OU. PRP OD. FP Pt states vision is doing well. Denies new floaters and FOL. BGL:did not check A1C: 11 in Nov       Last edited by Tilda Franco on 12/03/2020  1:04 PM. (History)      Referring physician: [name], MD [place],  [place] [address]  HISTORICAL INFORMATION:   Selected notes from the MEDICAL RECORD NUMBER    Lab Results  Component Value Date   HGBA1C 11.9 (A) 11/11/2020     CURRENT MEDICATIONS: No current outpatient medications on file. (Ophthalmic Drugs)   No current facility-administered medications for this visit. (Ophthalmic Drugs)   Current Outpatient Medications (Other)  Medication Sig  . acetaminophen (TYLENOL) 325 MG tablet Take 1-2 tablets (325-650 mg total) by mouth every 4 (four) hours as needed for mild pain.  Marland Kitchen amLODipine (NORVASC) 10 MG tablet Take 1 tablet (10 mg total) by mouth daily. For blood pressure  . aspirin EC 81 MG tablet Take 1 tablet (81 mg total) by mouth daily with breakfast. For stroke prevention  . atorvastatin (LIPITOR) 80 MG tablet Take 1 tablet (80 mg total) by mouth every evening. For stroke prevention  . clopidogrel (PLAVIX) 75 MG tablet Take 1 tablet (75 mg total) by mouth daily. For stroke prevention  . Continuous Blood Gluc Receiver (DEXCOM G6 RECEIVER) DEVI Check sugar 4 times a day  .  Continuous Blood Gluc Sensor (DEXCOM G6 SENSOR) MISC 1 each by Does not apply route every 14 (fourteen) days.  . Continuous Blood Gluc Transmit (DEXCOM G6 TRANSMITTER) MISC Check sugar 4 times a day  . DULoxetine (CYMBALTA) 20 MG capsule Take 1 capsule (20 mg total) by mouth daily.  Marland Kitchen glucagon 1 MG injection Inject 1 mg into the vein once as needed for up to 1 dose.  Marland Kitchen glucose blood (ONETOUCH ULTRA) test strip USE TO TEST BLOOD SUGAR 4 TIMES A DAY  . insulin aspart (NOVOLOG) 100 UNIT/ML FlexPen Inject per sliding scale before mealsI: Blood sugars between 200-250 Take 2 units, 250-300 take 3 units, Over 300 Take 4 units  . insulin degludec (TRESIBA FLEXTOUCH) 100 UNIT/ML FlexTouch Pen Inject up to 20 units max once per day  . Insulin Pen Needle (PEN NEEDLES 3/16") 31G X 5 MM MISC Four times day  . labetalol (NORMODYNE) 100 MG tablet Take 1 tablet (100 mg total) by mouth 2 (two) times daily. For BP  . lisinopril (ZESTRIL) 40 MG tablet Take 1 tablet (40 mg total) by mouth daily. For blood pressure  . nicotine (NICODERM CQ - DOSED IN MG/24 HOURS) 21 mg/24hr patch Place 1 patch (21 mg total) onto the skin daily.  . promethazine (PHENERGAN) 12.5 MG tablet Take 1 tablet (12.5 mg total) by mouth every 6 (six) hours as needed for nausea.  Marland Kitchen  Vitamin D, Ergocalciferol, (DRISDOL) 1.25 MG (50000 UNIT) CAPS capsule Take 1 capsule (50,000 Units total) by mouth once a week.   No current facility-administered medications for this visit. (Other)      REVIEW OF SYSTEMS: ROS    Positive for: Endocrine   Last edited by Tilda Franco on 12/03/2020  1:04 PM. (History)       ALLERGIES Allergies  Allergen Reactions  . Fructose Other (See Comments)    Increase of blood sugar     PAST MEDICAL HISTORY Past Medical History:  Diagnosis Date  . Diabetes mellitus    lantus/novolog  . Hyperlipidemia   . Hypertension   . Marijuana abuse    occaisionally  . Noncompliance with medication regimen   .  Stroke ([place])   . Tobacco abuse    5/day   Past Surgical History:  Procedure Laterality Date  . EYE SURGERY      FAMILY HISTORY Family History  Problem Relation Age of Onset  . Diabetes Father   . Kidney failure Father        last 4 mnths of life  . Hypertension Mother   . Diabetes Maternal Grandmother   . Pancreatic cancer Maternal Grandfather   . Diabetes Paternal Grandfather     SOCIAL HISTORY Social History   Tobacco Use  . Smoking status: Current Every Day Smoker    Packs/day: 0.50    Years: 8.00    Pack years: 4.00    Types: Cigarettes  . Smokeless tobacco: Never Used  . Tobacco comment: smoke 5-8 per day  Vaping Use  . Vaping Use: Never used  Substance Use Topics  . Alcohol use: No  . Drug use: Yes    Types: Marijuana    Comment: last use yesterday, every other days          OPHTHALMIC EXAM: Base Eye Exam    Visual Acuity (Snellen - Linear)      Right Left   Dist  20/30 +2 20/25 -2       Tonometry (Tonopen, 1:08 PM)      Right Left   Pressure 17 18       Pupils      Pupils Dark Light Shape React APD   Right PERRL 3 3 Round Minimal None   Left PERRL 3 3 Round Minimal None       Visual Fields (Counting fingers)      Left Right    Full Full       Neuro/Psych    Oriented x3: Yes   Mood/Affect: Normal       Dilation    Both eyes: 1.0% Mydriacyl, 2.5% Phenylephrine @ 1:08 PM        Slit Lamp and Fundus Exam    External Exam      Right Left   External Normal Normal       Slit Lamp Exam      Right Left   Lids/Lashes Normal Normal   Conjunctiva/Sclera White and quiet White and quiet   Cornea Clear Clear   Anterior Chamber Deep and quiet Deep and quiet   Iris Round and reactive Round and reactive   Lens Posterior chamber intraocular lens Posterior chamber intraocular lens   Anterior Vitreous Normal Normal       Fundus Exam      Right Left   Posterior Vitreous Normal Clear vitrectomized   Disc Normal Normal   C/D Ratio  0.45 0.45   Macula no macular  thickening, Microaneurysms no macular thickening, Microaneurysms   Vessels PDR-, PDR-active with NVE inferonasal PDR-quiet   Periphery Good PRP,  attached, room posteriorly for PRP Good PRP,  attached          IMAGING AND PROCEDURES  Imaging and Procedures for 12/03/20  Color Fundus Photography Optos - OU - Both Eyes       Right Eye Progression has worsened. Disc findings include normal observations. Macula : microaneurysms. Vessels : Neovascularization.   Left Eye Progression has been stable. Disc findings include normal observations. Macula : normal observations. Vessels : normal observations. Periphery : normal observations.   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  Signs of peripheral neovascularization inferiorly will probably need completion of PRP  Stable treated proliferative diabetic retinopathy of left eye with macular edema determined by examination associated with type 1 diabetes mellitus ([place]) The nature of regressed proliferative diabetic retinopathy was discussed with the patient. The patient was advised to maintain good glucose, blood pressure, monitor kidney function and serum lipid control as advised by personal physician. Rare risk for reactivation of progression exist with untreated severe anemia, untreated renal failure, untreated heart failure, and smoking. Complete avoidance of smoking was recommended. The chance of recurrent proliferative diabetic retinopathy was discussed as well as the chance of vitreous hemorrhage for which further treatments may be necessary.   Explained  to the patient that the quiescent  proliferative diabetic retinopathy disease is unlikely to ever worsen.  Worsening factors would include however severe anemia, hypertension out-of-control or impending renal failure.      ICD-10-CM   1. 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 Stable treated proliferative diabetic retinopathy of left eye with macular edema determined by examination associated with type 1 diabetes mellitus (HCC)  [id number] Color Fundus Photography Optos - OU - Both Eyes   E10.3512     1.  2.  3.  Ophthalmic Meds Ordered this visit:  No orders of the defined types were placed in this encounter.      Return in about 1 week (around 12/10/2020) for dilate, OD, PRP.  There are no Patient Instructions on file for this visit.   Explained the diagnoses, plan, and follow up with the patient and they expressed understanding.  Patient expressed understanding of the importance of proper follow up care.   Clent Demark Anwar Crill M.D. Diseases & Surgery of the Retina and Vitreous Retina & Diabetic [place] 12/03/20     Abbreviations: M myopia (nearsighted); A astigmatism; H hyperopia (farsighted); P presbyopia; Mrx spectacle prescription;  CTL contact lenses; OD right eye; OS left eye; OU both eyes  XT exotropia; ET esotropia; PEK punctate epithelial keratitis; PEE punctate epithelial erosions; DES dry eye syndrome; MGD meibomian gland dysfunction; ATs artificial tears; PFAT's preservative free artificial tears; [place] nuclear sclerotic cataract; PSC posterior subcapsular cataract; ERM epi-retinal membrane; PVD posterior vitreous detachment; RD retinal detachment; DM diabetes mellitus; DR diabetic retinopathy; NPDR non-proliferative diabetic retinopathy; PDR proliferative diabetic retinopathy; CSME clinically  significant macular  edema; DME diabetic macular edema; dbh dot blot hemorrhages; CWS cotton wool spot; POAG primary open angle glaucoma; C/D cup-to-disc ratio; HVF humphrey visual field; GVF goldmann visual field; OCT optical coherence tomography; IOP intraocular pressure; BRVO Branch retinal vein occlusion; CRVO central retinal vein occlusion; CRAO central retinal artery occlusion; BRAO branch retinal artery occlusion; RT retinal tear; SB scleral buckle; PPV pars plana vitrectomy; VH Vitreous hemorrhage; PRP panretinal laser photocoagulation; IVK intravitreal kenalog; VMT vitreomacular traction; MH Macular hole;  NVD neovascularization of the disc; NVE neovascularization elsewhere; AREDS age related eye disease study; ARMD age related macular degeneration; POAG primary open angle glaucoma; EBMD epithelial/anterior basement membrane dystrophy; ACIOL anterior chamber intraocular lens; IOL intraocular lens; PCIOL posterior chamber intraocular lens; Phaco/IOL phacoemulsification with intraocular lens placement; [place] photorefractive keratectomy; LASIK laser assisted in situ keratomileusis; HTN hypertension; DM diabetes mellitus; COPD chronic obstructive pulmonary disease

## 2020-12-11 ENCOUNTER — Encounter (INDEPENDENT_AMBULATORY_CARE_PROVIDER_SITE_OTHER): Payer: Self-pay | Admitting: Ophthalmology

## 2020-12-11 ENCOUNTER — Ambulatory Visit (INDEPENDENT_AMBULATORY_CARE_PROVIDER_SITE_OTHER): Payer: Medicare HMO | Admitting: Ophthalmology

## 2020-12-11 ENCOUNTER — Other Ambulatory Visit: Payer: Self-pay

## 2020-12-11 DIAGNOSIS — E103591 Type 1 diabetes mellitus with proliferative diabetic retinopathy without macular edema, right eye: Secondary | ICD-10-CM | POA: Diagnosis not present

## 2020-12-11 NOTE — Progress Notes (Signed)
12/11/2020     CHIEF COMPLAINT Patient presents for Retina Follow Up (PRP OD/Pt denies changes or issues since last visit.)   HISTORY OF PRESENT ILLNESS: Nathaniel [name] is a 35 y.o. male who presents to the clinic today for:   HPI    Retina Follow Up    Patient presents with  Diabetic Retinopathy.  In right eye.  Severity is severe.  Duration of 1 week.  Since onset it is stable.  I, the attending physician,  performed the HPI with the patient and updated documentation appropriately. Additional comments: PRP OD Pt denies changes or issues since last visit.       Last edited by Tilda Franco on 12/11/2020  2:50 PM. (History)      Referring physician: [name], MD [place],  [place] [id number]  HISTORICAL INFORMATION:   Selected notes from the MEDICAL RECORD NUMBER    Lab Results  Component Value Date   HGBA1C 11.9 (A) 11/11/2020     CURRENT MEDICATIONS: No current outpatient medications on file. (Ophthalmic Drugs)   No current facility-administered medications for this visit. (Ophthalmic Drugs)   Current Outpatient Medications (Other)  Medication Sig  . acetaminophen (TYLENOL) 325 MG tablet Take 1-2 tablets (325-650 mg total) by mouth every 4 (four) hours as needed for mild pain.  Marland Kitchen amLODipine (NORVASC) 10 MG tablet Take 1 tablet (10 mg total) by mouth daily. For blood pressure  . aspirin EC 81 MG tablet Take 1 tablet (81 mg total) by mouth daily with breakfast. For stroke prevention  . atorvastatin (LIPITOR) 80 MG tablet Take 1 tablet (80 mg total) by mouth every evening. For stroke prevention  . clopidogrel (PLAVIX) 75 MG tablet Take 1 tablet (75 mg total) by mouth daily. For stroke prevention  . Continuous Blood Gluc Receiver (DEXCOM G6 RECEIVER) DEVI Check sugar 4 times a day  . Continuous Blood Gluc Sensor (DEXCOM G6 SENSOR) MISC 1 each by Does not apply route every 14 (fourteen) days.  . Continuous Blood Gluc Transmit (DEXCOM G6  TRANSMITTER) MISC Check sugar 4 times a day  . DULoxetine (CYMBALTA) 20 MG capsule Take 1 capsule (20 mg total) by mouth daily.  Marland Kitchen glucagon 1 MG injection Inject 1 mg into the vein once as needed for up to 1 dose.  Marland Kitchen glucose blood (ONETOUCH ULTRA) test strip USE TO TEST BLOOD SUGAR 4 TIMES A DAY  . insulin aspart (NOVOLOG) 100 UNIT/ML FlexPen Inject per sliding scale before mealsI: Blood sugars between 200-250 Take 2 units, 250-300 take 3 units, Over 300 Take 4 units  . insulin degludec (TRESIBA FLEXTOUCH) 100 UNIT/ML FlexTouch Pen Inject up to 20 units max once per day  . Insulin Pen Needle (PEN NEEDLES 3/16") 31G X 5 MM MISC Four times day  . labetalol (NORMODYNE) 100 MG tablet Take 1 tablet (100 mg total) by mouth 2 (two) times daily. For BP  . lisinopril (ZESTRIL) 40 MG tablet Take 1 tablet (40 mg total) by mouth daily. For blood pressure  . nicotine (NICODERM CQ - DOSED IN MG/24 HOURS) 21 mg/24hr patch Place 1 patch (21 mg total) onto the skin daily.  . promethazine (PHENERGAN) 12.5 MG tablet Take 1 tablet (12.5 mg total) by mouth every 6 (six) hours as needed for nausea.  . Vitamin D, Ergocalciferol, (DRISDOL) 1.25 MG (50000 UNIT) CAPS capsule Take 1 capsule (50,000 Units total) by mouth once a week.   No current facility-administered medications for this visit. (  Other)      REVIEW OF SYSTEMS:    ALLERGIES Allergies  Allergen Reactions  . Fructose Other (See Comments)    Increase of blood sugar     PAST MEDICAL HISTORY Past Medical History:  Diagnosis Date  . Diabetes mellitus    lantus/novolog  . Hyperlipidemia   . Hypertension   . Marijuana abuse    occaisionally  . Noncompliance with medication regimen   . Stroke ([place])   . Tobacco abuse    5/day   Past Surgical History:  Procedure Laterality Date  . EYE SURGERY      FAMILY HISTORY Family History  Problem Relation Age of Onset  . Diabetes Father   . Kidney failure Father        last 4 mnths of life  .  Hypertension Mother   . Diabetes Maternal Grandmother   . Pancreatic cancer Maternal Grandfather   . Diabetes Paternal Grandfather     SOCIAL HISTORY Social History   Tobacco Use  . Smoking status: Current Every Day Smoker    Packs/day: 0.50    Years: 8.00    Pack years: 4.00    Types: Cigarettes  . Smokeless tobacco: Never Used  . Tobacco comment: smoke 5-8 per day  Vaping Use  . Vaping Use: Never used  Substance Use Topics  . Alcohol use: No  . Drug use: Yes    Types: Marijuana    Comment: last use yesterday, every other days          OPHTHALMIC EXAM:  Base Eye Exam    Visual Acuity (Snellen - Linear)      Right Left   Dist [place] 20/40    Dist cc  20/25 -2   Dist ph [place] 20/30 -2        Tonometry (Tonopen, 2:53 PM)      Right Left   Pressure 14 16       Neuro/Psych    Oriented x3: Yes   Mood/Affect: Normal       Dilation    Right eye: 1.0% Mydriacyl, 2.5% Phenylephrine @ 2:53 PM        Slit Lamp and Fundus Exam    External Exam      Right Left   External Normal Normal       Slit Lamp Exam      Right Left   Lids/Lashes Normal Normal   Conjunctiva/Sclera White and quiet White and quiet   Cornea Clear Clear   Anterior Chamber Deep and quiet Deep and quiet   Iris Round and reactive Round and reactive   Lens Posterior chamber intraocular lens Posterior chamber intraocular lens   Anterior Vitreous Normal Normal          IMAGING AND PROCEDURES  Imaging and Procedures for 12/11/20  Panretinal Photocoagulation - OD - Right Eye       Time Out Confirmed correct patient, procedure, site, and patient consented.   Anesthesia Topical anesthesia was used. Anesthetic medications included Proparacaine 0.5%.   Laser Information The type of laser was diode. Color was yellow. The duration in seconds was 0.04. The spot size was 390 microns. Laser power was 300. Total spots was 450.   Post-op The patient tolerated the procedure well. There were no  complications. The patient received written and verbal post procedure care education.   Notes Posteriorly and inferiorly having to use great pattern as the plant center patterns did not materialize due to media opacity  Treatment delivered  in  And inferonasal retinal                   ASSESSMENT/PLAN:  No problem-specific Assessment & Plan notes found for this encounter.      ICD-10-CM   1. 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  Patient instructed to contact the office promptly new onset visual acuity declines or distortion  2.  3.  Ophthalmic Meds Ordered this visit:  No orders of the defined types were placed in this encounter.      Return in about 6 months (around 06/13/2021) for DILATE OU, COLOR FP.  There are no Patient Instructions on file for this visit.   Explained the diagnoses, plan, and follow up with the patient and they expressed understanding.  Patient expressed understanding of the importance of proper follow up care.   Clent Demark Rankin M.D. Diseases & Surgery of the Retina and Vitreous Retina & Diabetic [place] 12/11/20     Abbreviations: M myopia (nearsighted); A astigmatism; H hyperopia (farsighted); P presbyopia; Mrx spectacle prescription;  CTL contact lenses; OD right eye; OS left eye; OU both eyes  XT exotropia; ET esotropia; PEK punctate epithelial keratitis; PEE punctate epithelial erosions; DES dry eye syndrome; MGD meibomian gland dysfunction; ATs artificial tears; PFAT's preservative free artificial tears; [place] nuclear sclerotic cataract; PSC posterior subcapsular cataract; ERM epi-retinal membrane; PVD posterior vitreous detachment; RD retinal detachment; DM diabetes mellitus; DR diabetic retinopathy; NPDR non-proliferative diabetic retinopathy; PDR proliferative diabetic  retinopathy; CSME clinically significant macular edema; DME diabetic macular edema; dbh dot blot hemorrhages; CWS cotton wool spot; POAG primary open angle glaucoma; C/D cup-to-disc ratio; HVF humphrey visual field; GVF goldmann visual field; OCT optical coherence tomography; IOP intraocular pressure; BRVO Branch retinal vein occlusion; CRVO central retinal vein occlusion; CRAO central retinal artery occlusion; BRAO branch retinal artery occlusion; RT retinal tear; SB scleral buckle; PPV pars plana vitrectomy; VH Vitreous hemorrhage; PRP panretinal laser photocoagulation; IVK intravitreal kenalog; VMT vitreomacular traction; MH Macular hole;  NVD neovascularization of the disc; NVE neovascularization elsewhere; AREDS age related eye disease study; ARMD age related macular degeneration; POAG primary open angle glaucoma; EBMD epithelial/anterior basement membrane dystrophy; ACIOL anterior chamber intraocular lens; IOL intraocular lens; PCIOL posterior chamber intraocular lens; Phaco/IOL phacoemulsification with intraocular lens placement; [place] photorefractive keratectomy; LASIK laser assisted in situ keratomileusis; HTN hypertension; DM diabetes mellitus; COPD chronic obstructive pulmonary disease

## 2020-12-15 ENCOUNTER — Ambulatory Visit (INDEPENDENT_AMBULATORY_CARE_PROVIDER_SITE_OTHER): Payer: Medicare HMO | Admitting: Vascular Surgery

## 2020-12-15 ENCOUNTER — Encounter: Payer: Self-pay | Admitting: Vascular Surgery

## 2020-12-15 ENCOUNTER — Other Ambulatory Visit: Payer: Self-pay

## 2020-12-15 ENCOUNTER — Ambulatory Visit (INDEPENDENT_AMBULATORY_CARE_PROVIDER_SITE_OTHER): Payer: Medicare HMO

## 2020-12-15 ENCOUNTER — Other Ambulatory Visit (HOSPITAL_COMMUNITY): Payer: Self-pay | Admitting: Vascular Surgery

## 2020-12-15 VITALS — BP 143/93 | HR 96 | Temp 98.4°F | Resp 16 | Ht 70.0 in | Wt 136.0 lb

## 2020-12-15 DIAGNOSIS — N19 Unspecified kidney failure: Secondary | ICD-10-CM | POA: Diagnosis not present

## 2020-12-15 DIAGNOSIS — N184 Chronic kidney disease, stage 4 (severe): Secondary | ICD-10-CM | POA: Diagnosis not present

## 2020-12-15 NOTE — H&P (View-Only) (Signed)
[facility] [place]  Patient name: Nathaniel [name] MRN: [id number] DOB: 15-Feb-1986 Sex: male  REASON FOR CONSULT: Discuss access for hemodialysis  Nephrologist:: Coladonato  HPI: Nathaniel [name] is a 35 y.o. male, who is here today for discussion of access for hemodialysis.  He is here today with his mother.  He is a juvenile diabetic.  He recently had treatment mission to [facility] with diabetic ketoacidosis.  He has renal insufficiency and is being seen today for planning for hemodialysis access.  He does not have any prior central lines.  He does not have a pacemaker.  Does have a prior history of stroke.  He is right-handed.  Past Medical History:  Diagnosis Date  . Diabetes mellitus    lantus/novolog  . Hyperlipidemia   . Hypertension   . Marijuana abuse    occaisionally  . Noncompliance with medication regimen   . Stroke ([place])   . Tobacco abuse    5/day    Family History  Problem Relation Age of Onset  . Diabetes Father   . Kidney failure Father        last 4 mnths of life  . Hypertension Mother   . Diabetes Maternal Grandmother   . Pancreatic cancer Maternal Grandfather   . Diabetes Paternal Grandfather     SOCIAL HISTORY: Social History   Socioeconomic History  . Marital status: Single    Spouse name: Not on file  . Number of children: Not on file  . Years of education: Not on file  . Highest education level: Not on file  Occupational History  . Occupation: disabled  Tobacco Use  . Smoking status: Current Every Day Smoker    Packs/day: 0.50    Years: 8.00    Pack years: 4.00    Types: Cigarettes  . Smokeless tobacco: Never Used  . Tobacco comment: smoke 5-8 per day  Vaping Use  . Vaping Use: Never used  Substance and Sexual Activity  . Alcohol use: No  . Drug use: Yes    Types: Marijuana    Comment: last use yesterday, every other days   . Sexual activity: Yes    Birth  control/protection: Condom  Other Topics Concern  . Not on file  Social History Narrative  . Not on file   Social Determinants of Health   Financial Resource Strain: Not on file  Food Insecurity: Not on file  Transportation Needs: Not on file  Physical Activity: Not on file  Stress: Not on file  Social Connections: Not on file  Intimate Partner Violence: Not on file    Allergies  Allergen Reactions  . Fructose Other (See Comments)    Increase of blood sugar     Current Outpatient Medications  Medication Sig Dispense Refill  . acetaminophen (TYLENOL) 325 MG tablet Take 1-2 tablets (325-650 mg total) by mouth every 4 (four) hours as needed for mild pain.    Marland Kitchen amLODipine (NORVASC) 10 MG tablet Take 1 tablet (10 mg total) by mouth daily. For blood pressure 90 tablet 11  . aspirin EC 81 MG tablet Take 1 tablet (81 mg total) by mouth daily with breakfast. For stroke prevention 30 tablet 11  . atorvastatin (LIPITOR) 80 MG tablet Take 1 tablet (80 mg total) by mouth every evening. For stroke prevention 90 tablet 11  . clopidogrel (PLAVIX) 75 MG tablet Take 1 tablet (75 mg total) by mouth daily. For stroke prevention 90 tablet 3  .  Continuous Blood Gluc Receiver (DEXCOM G6 RECEIVER) DEVI Check sugar 4 times a day 1 each 0  . Continuous Blood Gluc Sensor (DEXCOM G6 SENSOR) MISC 1 each by Does not apply route every 14 (fourteen) days. 9 each 3  . Continuous Blood Gluc Transmit (DEXCOM G6 TRANSMITTER) MISC Check sugar 4 times a day 1 each 3  . DULoxetine (CYMBALTA) 20 MG capsule Take 1 capsule (20 mg total) by mouth daily. 30 capsule 2  . glucagon 1 MG injection Inject 1 mg into the vein once as needed for up to 1 dose. 1 each 12  . glucose blood (ONETOUCH ULTRA) test strip USE TO TEST BLOOD SUGAR 4 TIMES A DAY 100 strip 3  . insulin aspart (NOVOLOG) 100 UNIT/ML FlexPen Inject per sliding scale before mealsI: Blood sugars between 200-250 Take 2 units, 250-300 take 3 units, Over 300 Take 4  units 15 mL 1  . insulin degludec (TRESIBA FLEXTOUCH) 100 UNIT/ML FlexTouch Pen Inject up to 20 units max once per day 30 mL 1  . Insulin Pen Needle (PEN NEEDLES 3/16") 31G X 5 MM MISC Four times day 200 each 11  . labetalol (NORMODYNE) 100 MG tablet Take 1 tablet (100 mg total) by mouth 2 (two) times daily. For BP 180 tablet 4  . lisinopril (ZESTRIL) 40 MG tablet Take 1 tablet (40 mg total) by mouth daily. For blood pressure 90 tablet 3  . nicotine (NICODERM CQ - DOSED IN MG/24 HOURS) 21 mg/24hr patch Place 1 patch (21 mg total) onto the skin daily. 28 patch 0  . promethazine (PHENERGAN) 12.5 MG tablet Take 1 tablet (12.5 mg total) by mouth every 6 (six) hours as needed for nausea. 12 tablet 0  . Vitamin D, Ergocalciferol, (DRISDOL) 1.25 MG (50000 UNIT) CAPS capsule Take 1 capsule (50,000 Units total) by mouth once a week. 5 capsule 2   No current facility-administered medications for this visit.    REVIEW OF SYSTEMS:  '[X]'$  denotes positive finding, '[ ]'$  denotes negative finding Cardiac  Comments:  Chest pain or chest pressure:    Shortness of breath upon exertion:    Short of breath when lying flat:    Irregular heart rhythm:        Vascular    Pain in calf, thigh, or hip brought on by ambulation:    Pain in feet at night that wakes you up from your sleep:     Blood clot in your veins:    Leg swelling:         Pulmonary    Oxygen at home:    Productive cough:     Wheezing:         Neurologic    Sudden weakness in arms or legs:     Sudden numbness in arms or legs:     Sudden onset of difficulty speaking or slurred speech:    Temporary loss of vision in one eye:     Problems with dizziness:         Gastrointestinal    Blood in stool:     Vomited blood:         Genitourinary    Burning when urinating:     Blood in urine:        Psychiatric    Major depression:         Hematologic    Bleeding problems:    Problems with blood clotting too easily:        Skin  Rashes  or ulcers:        Constitutional    Fever or chills:      PHYSICAL EXAM: Vitals:   12/15/20 1429  BP: (!) 143/93  Pulse: 96  Resp: 16  Temp: 98.4 F (36.9 C)  TempSrc: Temporal  SpO2: 99%  Weight: 136 lb (61.7 kg)  Height: '5\' 10"'$  (1.778 m)    GENERAL: The patient is a well-nourished male, in no acute distress. The vital signs are documented above. CARDIOVASCULAR: 2+ radial pulses bilaterally.  Very small surface veins bilaterally PULMONARY: There is good air exchange  MUSCULOSKELETAL: There are no major deformities or cyanosis. NEUROLOGIC: No focal weakness or paresthesias are detected. SKIN: There are no ulcers or rashes noted. PSYCHIATRIC: The patient has a normal affect.  DATA:  Bilateral arterial duplexes of his upper extremities revealed normal arterial triphasic flow.  His venous mapping revealed very small cephalic veins in his forearm and upper arm bilaterally.  He does have moderate size basilic veins bilaterally.  MEDICAL ISSUES: Had a long discussion with the patient and his mother regarding access for hemodialysis.  I discussed tunneled dialysis catheter for immediate use.  Also discussed AV graft and AV fistulas and advantages and disadvantages of each.  He currently has stable renal insufficiency.  He does appear to have basilic vein on the left which is acceptable for an attempt fistula creation.  I did explain the possibility of nonmaturation.  Explained that this would require a two-stage procedure with the initial fistula creation between the basilic vein and the brachial artery and then office visit in 4 to 6 weeks with ultrasound to determine maturation.  If this then had a good maturation would do a second stage transposition.  I would reserve AV graft placement until he is in imminent need for hemodialysis.  I feel that he will have a difficult time maintaining prosthetic graft patency.  We will proceed with left arm AV fistula creation at his earliest  Mercer. Trequan Marsolek, MD FACS [facility] [place] [facility] Tel [phone] Pager [phone]  Note: Portions of this report may have been transcribed using voice recognition software.  Every effort has been made to ensure accuracy; however, inadvertent computerized transcription errors may still be present.

## 2020-12-15 NOTE — Progress Notes (Signed)
[facility] [place]  Patient name: Nathaniel [name] MRN: [id number] DOB: 11-22-1985 Sex: male  REASON FOR CONSULT: Discuss access for hemodialysis  Nephrologist:: Coladonato  HPI: Nathaniel [name] is a 35 y.o. male, who is here today for discussion of access for hemodialysis.  He is here today with his mother.  He is a juvenile diabetic.  He recently had treatment mission to [facility] with diabetic ketoacidosis.  He has renal insufficiency and is being seen today for planning for hemodialysis access.  He does not have any prior central lines.  He does not have a pacemaker.  Does have a prior history of stroke.  He is right-handed.  Past Medical History:  Diagnosis Date  . Diabetes mellitus    lantus/novolog  . Hyperlipidemia   . Hypertension   . Marijuana abuse    occaisionally  . Noncompliance with medication regimen   . Stroke ([place])   . Tobacco abuse    5/day    Family History  Problem Relation Age of Onset  . Diabetes Father   . Kidney failure Father        last 4 mnths of life  . Hypertension Mother   . Diabetes Maternal Grandmother   . Pancreatic cancer Maternal Grandfather   . Diabetes Paternal Grandfather     SOCIAL HISTORY: Social History   Socioeconomic History  . Marital status: Single    Spouse name: Not on file  . Number of children: Not on file  . Years of education: Not on file  . Highest education level: Not on file  Occupational History  . Occupation: disabled  Tobacco Use  . Smoking status: Current Every Day Smoker    Packs/day: 0.50    Years: 8.00    Pack years: 4.00    Types: Cigarettes  . Smokeless tobacco: Never Used  . Tobacco comment: smoke 5-8 per day  Vaping Use  . Vaping Use: Never used  Substance and Sexual Activity  . Alcohol use: No  . Drug use: Yes    Types: Marijuana    Comment: last use yesterday, every other days   . Sexual activity: Yes    Birth  control/protection: Condom  Other Topics Concern  . Not on file  Social History Narrative  . Not on file   Social Determinants of Health   Financial Resource Strain: Not on file  Food Insecurity: Not on file  Transportation Needs: Not on file  Physical Activity: Not on file  Stress: Not on file  Social Connections: Not on file  Intimate Partner Violence: Not on file    Allergies  Allergen Reactions  . Fructose Other (See Comments)    Increase of blood sugar     Current Outpatient Medications  Medication Sig Dispense Refill  . acetaminophen (TYLENOL) 325 MG tablet Take 1-2 tablets (325-650 mg total) by mouth every 4 (four) hours as needed for mild pain.    Marland Kitchen amLODipine (NORVASC) 10 MG tablet Take 1 tablet (10 mg total) by mouth daily. For blood pressure 90 tablet 11  . aspirin EC 81 MG tablet Take 1 tablet (81 mg total) by mouth daily with breakfast. For stroke prevention 30 tablet 11  . atorvastatin (LIPITOR) 80 MG tablet Take 1 tablet (80 mg total) by mouth every evening. For stroke prevention 90 tablet 11  . clopidogrel (PLAVIX) 75 MG tablet Take 1 tablet (75 mg total) by mouth daily. For stroke prevention 90 tablet 3  .  Continuous Blood Gluc Receiver (DEXCOM G6 RECEIVER) DEVI Check sugar 4 times a day 1 each 0  . Continuous Blood Gluc Sensor (DEXCOM G6 SENSOR) MISC 1 each by Does not apply route every 14 (fourteen) days. 9 each 3  . Continuous Blood Gluc Transmit (DEXCOM G6 TRANSMITTER) MISC Check sugar 4 times a day 1 each 3  . DULoxetine (CYMBALTA) 20 MG capsule Take 1 capsule (20 mg total) by mouth daily. 30 capsule 2  . glucagon 1 MG injection Inject 1 mg into the vein once as needed for up to 1 dose. 1 each 12  . glucose blood (ONETOUCH ULTRA) test strip USE TO TEST BLOOD SUGAR 4 TIMES A DAY 100 strip 3  . insulin aspart (NOVOLOG) 100 UNIT/ML FlexPen Inject per sliding scale before mealsI: Blood sugars between 200-250 Take 2 units, 250-300 take 3 units, Over 300 Take 4  units 15 mL 1  . insulin degludec (TRESIBA FLEXTOUCH) 100 UNIT/ML FlexTouch Pen Inject up to 20 units max once per day 30 mL 1  . Insulin Pen Needle (PEN NEEDLES 3/16") 31G X 5 MM MISC Four times day 200 each 11  . labetalol (NORMODYNE) 100 MG tablet Take 1 tablet (100 mg total) by mouth 2 (two) times daily. For BP 180 tablet 4  . lisinopril (ZESTRIL) 40 MG tablet Take 1 tablet (40 mg total) by mouth daily. For blood pressure 90 tablet 3  . nicotine (NICODERM CQ - DOSED IN MG/24 HOURS) 21 mg/24hr patch Place 1 patch (21 mg total) onto the skin daily. 28 patch 0  . promethazine (PHENERGAN) 12.5 MG tablet Take 1 tablet (12.5 mg total) by mouth every 6 (six) hours as needed for nausea. 12 tablet 0  . Vitamin D, Ergocalciferol, (DRISDOL) 1.25 MG (50000 UNIT) CAPS capsule Take 1 capsule (50,000 Units total) by mouth once a week. 5 capsule 2   No current facility-administered medications for this visit.    REVIEW OF SYSTEMS:  '[X]'$  denotes positive finding, '[ ]'$  denotes negative finding Cardiac  Comments:  Chest pain or chest pressure:    Shortness of breath upon exertion:    Short of breath when lying flat:    Irregular heart rhythm:        Vascular    Pain in calf, thigh, or hip brought on by ambulation:    Pain in feet at night that wakes you up from your sleep:     Blood clot in your veins:    Leg swelling:         Pulmonary    Oxygen at home:    Productive cough:     Wheezing:         Neurologic    Sudden weakness in arms or legs:     Sudden numbness in arms or legs:     Sudden onset of difficulty speaking or slurred speech:    Temporary loss of vision in one eye:     Problems with dizziness:         Gastrointestinal    Blood in stool:     Vomited blood:         Genitourinary    Burning when urinating:     Blood in urine:        Psychiatric    Major depression:         Hematologic    Bleeding problems:    Problems with blood clotting too easily:        Skin  Rashes  or ulcers:        Constitutional    Fever or chills:      PHYSICAL EXAM: Vitals:   12/15/20 1429  BP: (!) 143/93  Pulse: 96  Resp: 16  Temp: 98.4 F (36.9 C)  TempSrc: Temporal  SpO2: 99%  Weight: 136 lb (61.7 kg)  Height: '5\' 10"'$  (1.778 m)    GENERAL: The patient is a well-nourished male, in no acute distress. The vital signs are documented above. CARDIOVASCULAR: 2+ radial pulses bilaterally.  Very small surface veins bilaterally PULMONARY: There is good air exchange  MUSCULOSKELETAL: There are no major deformities or cyanosis. NEUROLOGIC: No focal weakness or paresthesias are detected. SKIN: There are no ulcers or rashes noted. PSYCHIATRIC: The patient has a normal affect.  DATA:  Bilateral arterial duplexes of his upper extremities revealed normal arterial triphasic flow.  His venous mapping revealed very small cephalic veins in his forearm and upper arm bilaterally.  He does have moderate size basilic veins bilaterally.  MEDICAL ISSUES: Had a long discussion with the patient and his mother regarding access for hemodialysis.  I discussed tunneled dialysis catheter for immediate use.  Also discussed AV graft and AV fistulas and advantages and disadvantages of each.  He currently has stable renal insufficiency.  He does appear to have basilic vein on the left which is acceptable for an attempt fistula creation.  I did explain the possibility of nonmaturation.  Explained that this would require a two-stage procedure with the initial fistula creation between the basilic vein and the brachial artery and then office visit in 4 to 6 weeks with ultrasound to determine maturation.  If this then had a good maturation would do a second stage transposition.  I would reserve AV graft placement until he is in imminent need for hemodialysis.  I feel that he will have a difficult time maintaining prosthetic graft patency.  We will proceed with left arm AV fistula creation at his earliest  [place]ard. Inman Fettig, MD FACS [facility] Tel [phone] Pager [phone]  Note: Portions of this report may have been transcribed using voice recognition software.  Every effort has been made to ensure accuracy; however, inadvertent computerized transcription errors may still be present.

## 2020-12-16 ENCOUNTER — Other Ambulatory Visit: Payer: Self-pay

## 2020-12-29 NOTE — Patient Instructions (Signed)
Nathaniel [name]  12/29/2020     '@PREFPERIOPPHARMACY'$ @   Your procedure is scheduled on  01/08/2021.   Report to [facility] at  [phone]  A.M.   Call this number if you have problems the morning of surgery:  [phone]   Remember:  Do not eat or drink after midnight.                        Take these medicines the morning of surgery with A SIP OF WATER  Amlodipine, cymbalta, labetolol, phenergan (if needed).  Take 9 units of tresiba the night before your procedure if you take your insulin manually.  If your have an insulin pump, dial your pump down to your basal rate before you go to bed 01/07/2021.  DO NOT take any medications for diabetes the morning of your procedure, if your give yourself insulin. If you have an insulin pump, again, leave insulin at basal rate and bring extra supplies with you in case your pump becomes dislodged.  If your have a continuous glucose monitor in your skin, please remove it before your procedure and replace it once you get home.   If your glucose is 70 or below the morning of your procedure, drink 1/2 cup of clear juice and recheck your glucose in 15 minutes. If your glucose is still 70 or below, call [phone] for instructions.    If your glucose is 300 or above the morning of your procedure, call [phone] for instructions.     Do not wear jewelry, make-up or nail polish.  Do not wear lotions, powders, or perfumes, or deodorant.  Do not shave 48 hours prior to surgery.  Men may shave face and neck.  Do not bring valuables to the hospital.  [facility] is not responsible for any belongings or valuables.  Contacts, dentures or bridgework may not be worn into surgery.  Leave your suitcase in the car.  After surgery it may be brought to your room.  For patients admitted to the hospital, discharge time will be determined by your treatment team.  Patients discharged the day of surgery will not be allowed to drive home and  must have someone with them for 24 hours.  Place clean sheets on your bed the night before your procedure and DO NOT sleep with pets this night.  Shower with CHG the night before and the morning of your procedure. DO NOT put CHG on your face, hair or genitals.  After each shower, dry off with a clean towel, put on clean, comfortable clothes and brush your teeth.    Special instructions:  DO NOT smoke tobacco or vape for 23 hours before your procedure.   Please read over the following fact sheets that you were given. Coughing and Deep Breathing, Surgical Site Infection Prevention, Anesthesia Post-op Instructions and Care and Recovery After Surgery       AV Fistula Placement, Care After The following information offers guidance on how to care for yourself after your procedure. Your health care provider may also give you more specific instructions. If you have problems or questions, contact your health care provider. What can I expect after the procedure? After the procedure, it is common to have:  Soreness at the fistula site.  Vibration (thrill) over the fistula. Follow these instructions at home: Medicines  Take over-the-counter and prescription medicines only as told by your health care provider.  Ask your  health care provider if the medicine prescribed to you can cause constipation. You may need to take these actions to prevent or treat constipation: ? Drink enough fluid to keep your urine pale yellow. ? Take over-the-counter or prescription medicines. ? Eat foods that are high in fiber, such as beans, whole grains, and fresh fruits and vegetables. ? Limit foods that are high in fat and processed sugars, such as fried or sweet foods. Incision care Follow instructions from your health care provider about how to take care of your incision. Make sure you:  Wash your hands with soap and water for at least 20 seconds before and after you change your bandage (dressing). If soap  and water are not available, use hand sanitizer.  Change your dressing as told by your health care provider.  Leave stitches (sutures), skin glue, or adhesive strips in place. These skin closures may need to stay in place for 2 weeks or longer. If adhesive strip edges start to loosen and curl up, you may trim the loose edges. Do not remove adhesive strips completely unless your health care provider tells you to do that.   Fistula care  Check your fistula site every day to make sure the thrill feels the same.  Check your fistula site every day for signs of infection. Check for: ? More redness, swelling, or pain. ? Fluid or blood. ? Warmth. ? Pus or a bad smell.  Raise (elevate) the affected area above the level of your heart while you are sitting or lying down.  Do not lift anything that is heavier than 10 lb (4.5 kg), or the limit that you are told, until your health care provider says that it is safe.  Do not lie down on your fistula arm.  Do not let anyone draw blood or take a blood pressure reading on your fistula arm. This is important.  Do not wear tight jewelry or clothing over your fistula arm.   Bathing  Do not take baths, swim, or use a hot tub until your health care provider approves. Ask your health care provider if you may take showers. You may only be allowed to take sponge baths.  Keep the area around your incision clean and dry. General instructions  Rest at home for a day or two.  If you were given a sedative during the procedure, it can affect you for several hours. Do not drive or operate machinery until your health care provider says that it is safe.  Return to your normal activities as told. Ask your health care provider what activities are safe for you.  Keep all follow-up visits. This is important. Contact a health care provider if:  You have more redness, swelling, or pain around your fistula site.  Your fistula site feels warm to the touch.  You  have pus or a bad smell coming from your fistula site.  You have a fever or chills.  You feel numb or cold in your arm or your fistula site.  You feel a decrease or a change in the thrill over the fistula. Get help right away if:  You have bleeding from your fistula site that will not stop.  You have chest pain.  You have trouble breathing. These symptoms may represent a serious problem that is an emergency. Do not wait to see if the symptoms will go away. Get medical help right away. Call your local emergency services (911 in the [place] Do not drive yourself to the hospital.  Summary  Follow instructions from your health care provider about how to take care of your incision.  Do not let anyone draw blood or take a blood pressure reading on your fistula arm. This is important.  Contact a health care provider if you have a change in the thrill or have any signs of infection at your fistula site.  Keep all follow-up visits. This is important. This information is not intended to replace advice given to you by your health care provider. Make sure you discuss any questions you have with your health care provider. Document Revised: 04/16/2020 Document Reviewed: 04/16/2020 Elsevier Patient Education  2021 [employer] After This sheet gives you information about how to care for yourself after your procedure. Your health care provider may also give you more specific instructions. If you have problems or questions, contact your health care provider. What can I expect after the procedure? After the procedure, it is common to have:  Tiredness.  Forgetfulness about what happened after the procedure.  Impaired judgment for important decisions.  Nausea or vomiting.  Some difficulty with balance. Follow these instructions at home: For the time period you were told by your health care provider:  Rest as needed.  Do not participate in activities where  you could fall or become injured.  Do not drive or use machinery.  Do not drink alcohol.  Do not take sleeping pills or medicines that cause drowsiness.  Do not make important decisions or sign legal documents.  Do not take care of children on your own.      Eating and drinking  Follow the diet that is recommended by your health care provider.  Drink enough fluid to keep your urine pale yellow.  If you vomit: ? Drink water, juice, or soup when you can drink without vomiting. ? Make sure you have little or no nausea before eating solid foods. General instructions  Have a responsible adult stay with you for the time you are told. It is important to have someone help care for you until you are awake and alert.  Take over-the-counter and prescription medicines only as told by your health care provider.  If you have sleep apnea, surgery and certain medicines can increase your risk for breathing problems. Follow instructions from your health care provider about wearing your sleep device: ? Anytime you are sleeping, including during daytime naps. ? While taking prescription pain medicines, sleeping medicines, or medicines that make you drowsy.  Avoid smoking.  Keep all follow-up visits as told by your health care provider. This is important. Contact a health care provider if:  You keep feeling nauseous or you keep vomiting.  You feel light-headed.  You are still sleepy or having trouble with balance after 24 hours.  You develop a rash.  You have a fever.  You have redness or swelling around the IV site. Get help right away if:  You have trouble breathing.  You have new-onset confusion at home. Summary  For several hours after your procedure, you may feel tired. You may also be forgetful and have poor judgment.  Have a responsible adult stay with you for the time you are told. It is important to have someone help care for you until you are awake and alert.  Rest as  told. Do not drive or operate machinery. Do not drink alcohol or take sleeping pills.  Get help right away if you have trouble breathing, or if you suddenly become  confused. This information is not intended to replace advice given to you by your health care provider. Make sure you discuss any questions you have with your health care provider. Document Revised: 05/22/2020 Document Reviewed: 08/09/2019 Elsevier Patient Education  2021 [employer].

## 2021-01-05 ENCOUNTER — Encounter (HOSPITAL_COMMUNITY)
Admission: RE | Admit: 2021-01-05 | Discharge: 2021-01-05 | Disposition: A | Discharge status: Home / Self Care | Payer: Medicare HMO | Source: Ambulatory Visit | Attending: Vascular Surgery | Admitting: Vascular Surgery

## 2021-01-05 ENCOUNTER — Encounter (HOSPITAL_COMMUNITY): Payer: Self-pay

## 2021-01-05 ENCOUNTER — Other Ambulatory Visit: Payer: Self-pay

## 2021-01-06 ENCOUNTER — Other Ambulatory Visit (HOSPITAL_COMMUNITY)
Admission: RE | Admit: 2021-01-06 | Discharge: 2021-01-06 | Disposition: A | Discharge status: Home / Self Care | Payer: Medicare HMO | Source: Ambulatory Visit | Attending: Vascular Surgery | Admitting: Vascular Surgery

## 2021-01-06 ENCOUNTER — Other Ambulatory Visit: Payer: Self-pay

## 2021-01-06 DIAGNOSIS — Z01812 Encounter for preprocedural laboratory examination: Secondary | ICD-10-CM | POA: Insufficient documentation

## 2021-01-06 DIAGNOSIS — Z20822 Contact with and (suspected) exposure to covid-19: Secondary | ICD-10-CM | POA: Insufficient documentation

## 2021-01-06 LAB — SARS CORONAVIRUS 2 (TAT 6-24 HRS): SARS Coronavirus 2: NEGATIVE

## 2021-01-08 ENCOUNTER — Ambulatory Visit (HOSPITAL_COMMUNITY): Payer: Medicare HMO | Admitting: Certified Registered"

## 2021-01-08 ENCOUNTER — Encounter (HOSPITAL_COMMUNITY)
Admission: RE | Disposition: A | Discharge status: Home / Self Care | Payer: Self-pay | Source: Home / Self Care | Attending: Vascular Surgery

## 2021-01-08 ENCOUNTER — Encounter (HOSPITAL_COMMUNITY): Payer: Self-pay | Admitting: Vascular Surgery

## 2021-01-08 ENCOUNTER — Ambulatory Visit (HOSPITAL_COMMUNITY)
Admission: RE | Admit: 2021-01-08 | Discharge: 2021-01-08 | Disposition: A | Discharge status: Home / Self Care | Payer: Medicare HMO | Attending: Vascular Surgery | Admitting: Vascular Surgery

## 2021-01-08 DIAGNOSIS — E1122 Type 2 diabetes mellitus with diabetic chronic kidney disease: Secondary | ICD-10-CM | POA: Insufficient documentation

## 2021-01-08 DIAGNOSIS — N183 Chronic kidney disease, stage 3 unspecified: Secondary | ICD-10-CM

## 2021-01-08 DIAGNOSIS — Z800 Family history of malignant neoplasm of digestive organs: Secondary | ICD-10-CM | POA: Diagnosis not present

## 2021-01-08 DIAGNOSIS — Z91018 Allergy to other foods: Secondary | ICD-10-CM | POA: Insufficient documentation

## 2021-01-08 DIAGNOSIS — N185 Chronic kidney disease, stage 5: Secondary | ICD-10-CM | POA: Diagnosis not present

## 2021-01-08 DIAGNOSIS — Z833 Family history of diabetes mellitus: Secondary | ICD-10-CM | POA: Diagnosis not present

## 2021-01-08 DIAGNOSIS — Z79899 Other long term (current) drug therapy: Secondary | ICD-10-CM | POA: Diagnosis not present

## 2021-01-08 DIAGNOSIS — F17210 Nicotine dependence, cigarettes, uncomplicated: Secondary | ICD-10-CM | POA: Diagnosis not present

## 2021-01-08 DIAGNOSIS — Z841 Family history of disorders of kidney and ureter: Secondary | ICD-10-CM | POA: Diagnosis not present

## 2021-01-08 DIAGNOSIS — N189 Chronic kidney disease, unspecified: Secondary | ICD-10-CM | POA: Insufficient documentation

## 2021-01-08 DIAGNOSIS — I129 Hypertensive chronic kidney disease with stage 1 through stage 4 chronic kidney disease, or unspecified chronic kidney disease: Secondary | ICD-10-CM | POA: Insufficient documentation

## 2021-01-08 DIAGNOSIS — Z794 Long term (current) use of insulin: Secondary | ICD-10-CM | POA: Insufficient documentation

## 2021-01-08 DIAGNOSIS — Z8673 Personal history of transient ischemic attack (TIA), and cerebral infarction without residual deficits: Secondary | ICD-10-CM | POA: Diagnosis not present

## 2021-01-08 DIAGNOSIS — Z7902 Long term (current) use of antithrombotics/antiplatelets: Secondary | ICD-10-CM | POA: Diagnosis not present

## 2021-01-08 DIAGNOSIS — Z7982 Long term (current) use of aspirin: Secondary | ICD-10-CM | POA: Insufficient documentation

## 2021-01-08 DIAGNOSIS — Z8249 Family history of ischemic heart disease and other diseases of the circulatory system: Secondary | ICD-10-CM | POA: Insufficient documentation

## 2021-01-08 HISTORY — PX: AV FISTULA PLACEMENT: SHX1204

## 2021-01-08 LAB — POCT I-STAT, CHEM 8
BUN: 47 mg/dL — ABNORMAL HIGH (ref 6–20)
Calcium, Ion: 1.25 mmol/L (ref 1.15–1.40)
Chloride: 104 mmol/L (ref 98–111)
Creatinine, Ser: 3.7 mg/dL — ABNORMAL HIGH (ref 0.61–1.24)
Glucose, Bld: 411 mg/dL — ABNORMAL HIGH (ref 70–99)
HCT: 33 % — ABNORMAL LOW (ref 39.0–52.0)
Hemoglobin: 11.2 g/dL — ABNORMAL LOW (ref 13.0–17.0)
Potassium: 5.4 mmol/L — ABNORMAL HIGH (ref 3.5–5.1)
Sodium: 137 mmol/L (ref 135–145)
TCO2: 24 mmol/L (ref 22–32)

## 2021-01-08 LAB — GLUCOSE, CAPILLARY
Glucose-Capillary: 301 mg/dL — ABNORMAL HIGH (ref 70–99)
Glucose-Capillary: 300 mg/dL — ABNORMAL HIGH (ref 70–99)

## 2021-01-08 SURGERY — ARTERIOVENOUS (AV) FISTULA CREATION
Anesthesia: General | Laterality: Left

## 2021-01-08 MED ORDER — DEXAMETHASONE SODIUM PHOSPHATE 10 MG/ML IJ SOLN
INTRAMUSCULAR | Status: AC
  Filled 2021-01-08: qty 1

## 2021-01-08 MED ORDER — EPHEDRINE 5 MG/ML INJ
INTRAVENOUS | Status: AC
  Filled 2021-01-08: qty 10

## 2021-01-08 MED ORDER — PROPOFOL 10 MG/ML IV BOLUS
INTRAVENOUS | Status: AC
  Filled 2021-01-08: qty 40

## 2021-01-08 MED ORDER — CHLORHEXIDINE GLUCONATE 4 % EX LIQD: 60.0000 mL | Freq: Once | CUTANEOUS | Status: DC

## 2021-01-08 MED ORDER — PHENYLEPHRINE HCL-NACL 10-0.9 MG/250ML-% IV SOLN
INTRAVENOUS | Status: AC
  Filled 2021-01-08: qty 250

## 2021-01-08 MED ORDER — CEFAZOLIN SODIUM-DEXTROSE 2-4 GM/100ML-% IV SOLN
INTRAVENOUS | Status: AC
  Filled 2021-01-08: qty 100

## 2021-01-08 MED ORDER — 0.9 % SODIUM CHLORIDE (POUR BTL) OPTIME
TOPICAL | Status: DC | PRN
  Administered 2021-01-08: 1000 mL

## 2021-01-08 MED ORDER — ONDANSETRON HCL 4 MG/2ML IJ SOLN
INTRAMUSCULAR | Status: DC | PRN
  Administered 2021-01-08: 4 mg via INTRAVENOUS

## 2021-01-08 MED ORDER — MIDAZOLAM HCL 2 MG/2ML IJ SOLN
INTRAMUSCULAR | Status: DC | PRN
  Administered 2021-01-08 (×2): 1 mg via INTRAVENOUS

## 2021-01-08 MED ORDER — PROPOFOL 500 MG/50ML IV EMUL
INTRAVENOUS | Status: DC | PRN
  Administered 2021-01-08: 50 ug/kg/min via INTRAVENOUS

## 2021-01-08 MED ORDER — INSULIN ASPART 100 UNIT/ML ~~LOC~~ SOLN
4.0000 [IU] | Freq: Once | SUBCUTANEOUS | Status: AC
  Administered 2021-01-08: 4 [IU] via SUBCUTANEOUS
  Filled 2021-01-08: qty 0.04

## 2021-01-08 MED ORDER — PHENYLEPHRINE 40 MCG/ML (10ML) SYRINGE FOR IV PUSH (FOR BLOOD PRESSURE SUPPORT)
PREFILLED_SYRINGE | INTRAVENOUS | Status: DC | PRN
  Administered 2021-01-08: 80 ug via INTRAVENOUS

## 2021-01-08 MED ORDER — OXYCODONE-ACETAMINOPHEN 5-325 MG PO TABS: 1.0000 | ORAL_TABLET | Freq: Four times a day (QID) | ORAL | 0 refills | Status: DC | PRN

## 2021-01-08 MED ORDER — SODIUM CHLORIDE 0.9 % IV SOLN
INTRAVENOUS | Status: DC
  Administered 2021-01-08: 1000 mL via INTRAVENOUS

## 2021-01-08 MED ORDER — LIDOCAINE HCL (PF) 2 % IJ SOLN
INTRAMUSCULAR | Status: AC
  Filled 2021-01-08: qty 5

## 2021-01-08 MED ORDER — HEPARIN SODIUM (PORCINE) 1000 UNIT/ML IJ SOLN
INTRAMUSCULAR | Status: AC
  Filled 2021-01-08: qty 6

## 2021-01-08 MED ORDER — SODIUM CHLORIDE 0.9 % IV SOLN
INTRAVENOUS | Status: DC | PRN
  Administered 2021-01-08: 500 mL

## 2021-01-08 MED ORDER — KETAMINE HCL 50 MG/5ML IJ SOSY
PREFILLED_SYRINGE | INTRAMUSCULAR | Status: AC
  Filled 2021-01-08: qty 5

## 2021-01-08 MED ORDER — KETAMINE HCL 10 MG/ML IJ SOLN
INTRAMUSCULAR | Status: DC | PRN
  Administered 2021-01-08: 20 mg via INTRAVENOUS
  Administered 2021-01-08 (×2): 5 mg via INTRAVENOUS

## 2021-01-08 MED ORDER — PHENYLEPHRINE 40 MCG/ML (10ML) SYRINGE FOR IV PUSH (FOR BLOOD PRESSURE SUPPORT)
PREFILLED_SYRINGE | INTRAVENOUS | Status: AC
  Filled 2021-01-08: qty 10

## 2021-01-08 MED ORDER — CEFAZOLIN SODIUM-DEXTROSE 2-4 GM/100ML-% IV SOLN
2.0000 g | INTRAVENOUS | Status: AC
  Administered 2021-01-08: 2 g via INTRAVENOUS

## 2021-01-08 MED ORDER — MIDAZOLAM HCL 2 MG/2ML IJ SOLN
INTRAMUSCULAR | Status: AC
  Filled 2021-01-08: qty 2

## 2021-01-08 MED ORDER — LIDOCAINE-EPINEPHRINE 0.5 %-1:200000 IJ SOLN
INTRAMUSCULAR | Status: DC | PRN
  Administered 2021-01-08: 8.5 mL

## 2021-01-08 MED ORDER — ORAL CARE MOUTH RINSE
15.0000 mL | Freq: Once | OROMUCOSAL | Status: AC
  Administered 2021-01-08: 15 mL via OROMUCOSAL

## 2021-01-08 MED ORDER — LIDOCAINE-EPINEPHRINE 0.5 %-1:200000 IJ SOLN
INTRAMUSCULAR | Status: AC
  Filled 2021-01-08: qty 1

## 2021-01-08 MED ORDER — FENTANYL CITRATE (PF) 100 MCG/2ML IJ SOLN
INTRAMUSCULAR | Status: DC | PRN
  Administered 2021-01-08 (×2): 25 ug via INTRAVENOUS
  Administered 2021-01-08: 50 ug via INTRAVENOUS

## 2021-01-08 MED ORDER — CHLORHEXIDINE GLUCONATE 0.12 % MT SOLN: 15.0000 mL | Freq: Once | OROMUCOSAL | Status: AC

## 2021-01-08 MED ORDER — FENTANYL CITRATE (PF) 100 MCG/2ML IJ SOLN
INTRAMUSCULAR | Status: AC
  Filled 2021-01-08: qty 2

## 2021-01-08 MED ORDER — VASOPRESSIN 20 UNIT/ML IV SOLN
INTRAVENOUS | Status: AC
  Filled 2021-01-08: qty 1

## 2021-01-08 MED ORDER — LACTATED RINGERS IV SOLN: INTRAVENOUS | Status: DC

## 2021-01-08 MED ORDER — ALBUTEROL SULFATE HFA 108 (90 BASE) MCG/ACT IN AERS
INHALATION_SPRAY | RESPIRATORY_TRACT | Status: AC
  Filled 2021-01-08: qty 6.7

## 2021-01-08 MED ORDER — LIDOCAINE 2% (20 MG/ML) 5 ML SYRINGE
INTRAMUSCULAR | Status: DC | PRN
  Administered 2021-01-08: 40 mg via INTRAVENOUS

## 2021-01-08 MED ORDER — PROPOFOL 10 MG/ML IV BOLUS
INTRAVENOUS | Status: DC | PRN
  Administered 2021-01-08 (×2): 20 mg via INTRAVENOUS

## 2021-01-08 SURGICAL SUPPLY — 41 items
ADH SKN CLS APL DERMABOND .7 (GAUZE/BANDAGES/DRESSINGS) ×1
ARMBAND PINK RESTRICT EXTREMIT (MISCELLANEOUS) ×2 IMPLANT
BAG HAMPER (MISCELLANEOUS) ×2 IMPLANT
CANNULA VESSEL 3MM 2 BLNT TIP (CANNULA) ×2 IMPLANT
CLIP LIGATING EXTRA MED SLVR (CLIP) ×2 IMPLANT
CLIP LIGATING EXTRA SM BLUE (MISCELLANEOUS) ×2 IMPLANT
COVER LIGHT HANDLE STERIS (MISCELLANEOUS) ×4 IMPLANT
COVER MAYO STAND XLG (MISCELLANEOUS) ×2 IMPLANT
COVER WAND RF STERILE (DRAPES) ×2 IMPLANT
DECANTER SPIKE VIAL GLASS SM (MISCELLANEOUS) ×2 IMPLANT
DERMABOND ADVANCED (GAUZE/BANDAGES/DRESSINGS) ×1
DERMABOND ADVANCED .7 DNX12 (GAUZE/BANDAGES/DRESSINGS) ×1 IMPLANT
ELECT REM PT RETURN 9FT ADLT (ELECTROSURGICAL) ×2
ELECTRODE REM PT RTRN 9FT ADLT (ELECTROSURGICAL) ×1 IMPLANT
GAUZE SPONGE 4X4 12PLY STRL (GAUZE/BANDAGES/DRESSINGS) ×2 IMPLANT
GLOVE SS BIOGEL STRL SZ 7.5 (GLOVE) ×1 IMPLANT
GLOVE SUPERSENSE BIOGEL SZ 7.5 (GLOVE) ×1
GLOVE SURG UNDER POLY LF SZ7 (GLOVE) ×6 IMPLANT
GOWN STRL REUS W/TWL LRG LVL3 (GOWN DISPOSABLE) ×6 IMPLANT
IV NS 500ML (IV SOLUTION) ×2
IV NS 500ML BAXH (IV SOLUTION) ×1 IMPLANT
KIT BLADEGUARD II DBL (SET/KITS/TRAYS/PACK) ×2 IMPLANT
KIT TURNOVER KIT A (KITS) ×2 IMPLANT
MANIFOLD NEPTUNE II (INSTRUMENTS) ×2 IMPLANT
MARKER SKIN DUAL TIP RULER LAB (MISCELLANEOUS) ×4 IMPLANT
NDL HYPO 18GX1.5 BLUNT FILL (NEEDLE) ×1 IMPLANT
NEEDLE HYPO 18GX1.5 BLUNT FILL (NEEDLE) ×2 IMPLANT
NS IRRIG 1000ML POUR BTL (IV SOLUTION) ×2 IMPLANT
PACK CV ACCESS (CUSTOM PROCEDURE TRAY) ×2 IMPLANT
PAD ARMBOARD 7.5X6 YLW CONV (MISCELLANEOUS) ×4 IMPLANT
SET BASIN LINEN APH (SET/KITS/TRAYS/PACK) ×2 IMPLANT
SOL PREP POV-IOD 4OZ 10% (MISCELLANEOUS) ×2 IMPLANT
SOL PREP PROV IODINE SCRUB 4OZ (MISCELLANEOUS) ×2 IMPLANT
SUT PROLENE 6 0 CC (SUTURE) ×2 IMPLANT
SUT SILK 2 0 SH (SUTURE) IMPLANT
SUT VIC AB 3-0 SH 27 (SUTURE) ×2
SUT VIC AB 3-0 SH 27X BRD (SUTURE) ×1 IMPLANT
SYR 10ML LL (SYRINGE) ×2 IMPLANT
SYR BULB IRRIG 60ML STRL (SYRINGE) ×1 IMPLANT
SYR CONTROL 10ML LL (SYRINGE) ×2 IMPLANT
UNDERPAD 30X36 HEAVY ABSORB (UNDERPADS AND DIAPERS) ×2 IMPLANT

## 2021-01-08 NOTE — Op Note (Signed)
    OPERATIVE REPORT  DATE OF SURGERY: 01/08/2021  PATIENT: Nathaniel [name], 35 y.o. male MRN: [id number]  DOB: [birth year]  PRE-OPERATIVE DIAGNOSIS: Chronic renal insufficiency  POST-OPERATIVE DIAGNOSIS:  Same  PROCEDURE: Left first stage brachiobasilic fistula  SURGEON:  Curt Jews, M.D.  PHYSICIAN ASSISTANT: Vevelyn Royals, RN  The assistant was needed for exposure and to expedite the case  ANESTHESIA: Local with sedation  EBL: per anesthesia record  Total I/O In: 100 [IV Piggyback:100] Out: -   BLOOD ADMINISTERED: none  DRAINS: none  SPECIMEN: none  COUNTS CORRECT:  YES  PATIENT DISPOSITION:  PACU - hemodynamically stable  PROCEDURE DETAILS: Patient was taken operating placed supine position where the area of the left arm was prepped and draped you sterile fashion.  SonoSite ultrasound was used to visualize the basilic vein at the antecubital space which was of moderate size.  Brachial artery was also imaged with SonoSite and had minimal atherosclerotic change.  A longitudinal incision was made at the antecubital area after instilling local anesthesia.  The basilic vein was of good caliber.  Tributary branches were ligated and divided.  The brachial artery was exposed through the same incision.  The artery was small to moderate size but had minimal atherosclerotic change.  The vein was ligated distally and was mobilized to the level of the brachial artery.  The brachial artery was occluded proximally and distally and was opened with an 11 blade and sent longstanding with Potts scissors.  A small arteriotomy was created to reduce risk of steal.  The vein was cut to the appropriate length and was spatulated and sewn end-to-side to the artery with a running 6-0 Prolene suture.  Prior to completion of the anastomosis a 1-1/2 dilator passed easily proximally and distally through the arterial anastomosis.  The anastomosis was completed and clamps removed from the artery.  There  was excellent thrill and excellent initial size.  The wounds were irrigated with saline.  Hemostasis obtained after cautery.  The wound was closed with 3-0 Vicryl in the subcutaneous and subcuticular tissue.  Sterile dressing was applied and the patient was transferred to the recovery room in stable condition   Nathaniel [name], M.D., [facility] 01/08/2021 9:04 AM  Note: Portions of this report may have been transcribed using voice recognition software.  Every effort has been made to ensure accuracy; however, inadvertent computerized transcription errors may still be present.

## 2021-01-08 NOTE — Interval H&P Note (Signed)
History and Physical Interval Note:  01/08/2021 6:46 AM  Nathaniel [name]  has presented today for surgery, with the diagnosis of CHRONIC KIDNEY DISEASE, STAGE IV.  The various methods of treatment have been discussed with the patient and family. After consideration of risks, benefits and other options for treatment, the patient has consented to  Procedure(s): LEFT ARM ARTERIOVENOUS (AV) FISTULA VERSUS GRAFT CREATION (Left) as a surgical intervention.  The patient's history has been reviewed, patient examined, no change in status, stable for surgery.  I have reviewed the patient's chart and labs.  Questions were answered to the patient's satisfaction.     Curt Jews

## 2021-01-08 NOTE — Anesthesia Preprocedure Evaluation (Signed)
Anesthesia Evaluation  Patient identified by MRN, date of birth, ID band Patient awake    Reviewed: Allergy & Precautions, H&P , NPO status , Patient's Chart, lab work & pertinent test results, reviewed documented beta blocker date and time   Airway Mallampati: II  TM Distance: >3 FB Neck ROM: full    Dental no notable dental hx. (+) Teeth Intact   Pulmonary neg pulmonary ROS, Current Smoker and Patient abstained from smoking.,    Pulmonary exam normal breath sounds clear to auscultation       Cardiovascular Exercise Tolerance: Good hypertension, + Peripheral Vascular Disease   Rhythm:regular Rate:Normal     Neuro/Psych CVA, No Residual Symptoms negative psych ROS   GI/Hepatic negative GI ROS, Neg liver ROS,   Endo/Other  negative endocrine ROSdiabetes  Renal/GU ESRFRenal disease  negative genitourinary   Musculoskeletal   Abdominal   Peds  Hematology negative hematology ROS (+)   Anesthesia Other Findings Discussed with patient - very brittle diabetic.  Blood glucose typically in the 300's in the AM.  Will follow sliding scale (4 units regular human insulin SQ) and check frequently.  K+ 5.4 - OK to proceed.  Reproductive/Obstetrics negative OB ROS                             Anesthesia Physical Anesthesia Plan  ASA: IV  Anesthesia Plan: General   Post-op Pain Management:    Induction:   PONV Risk Score and Plan: Propofol infusion  Airway Management Planned:   Additional Equipment:   Intra-op Plan:   Post-operative Plan:   Informed Consent: I have reviewed the patients History and Physical, chart, labs and discussed the procedure including the risks, benefits and alternatives for the proposed anesthesia with the patient or authorized representative who has indicated his/her understanding and acceptance.     Dental Advisory Given  Plan Discussed with: CRNA  Anesthesia  Plan Comments:         Anesthesia Quick Evaluation

## 2021-01-08 NOTE — Discharge Instructions (Signed)
[facility]  Discharge Instructions  AV Fistula or Graft Surgery for Dialysis Access  Please refer to the following instructions for your post-procedure care. Your surgeon or physician assistant will discuss any changes with you.  Activity  You may drive the day following your surgery, if you are comfortable and no longer taking prescription pain medication. Resume full activity as the soreness in your incision resolves.  Bathing/Showering  You may shower after you go home. Keep your incision dry for 48 hours. Do not soak in a bathtub, hot tub, or swim until the incision heals completely. You may not shower if you have a hemodialysis catheter.  Incision Care  Clean your incision with mild soap and water after 48 hours. Pat the area dry with a clean towel. You do not need a bandage unless otherwise instructed. Do not apply any ointments or creams to your incision. You may have skin glue on your incision. Do not peel it off. It will come off on its own in about one week. Your arm may swell a bit after surgery. To reduce swelling use pillows to elevate your arm so it is above your heart. Your doctor will tell you if you need to lightly wrap your arm with an ACE bandage.  Diet  Resume your normal diet. There are not special food restrictions following this procedure. In order to heal from your surgery, it is CRITICAL to get adequate nutrition. Your body requires vitamins, minerals, and protein. Vegetables are the best source of vitamins and minerals. Vegetables also provide the perfect balance of protein. Processed food has little nutritional value, so try to avoid this.  Medications  Resume taking all of your medications. If your incision is causing pain, you may take over-the counter pain relievers such as acetaminophen (Tylenol). If you were prescribed a stronger pain medication, please be aware these medications can cause nausea and constipation. Prevent  nausea by taking the medication with a snack or meal. Avoid constipation by drinking plenty of fluids and eating foods with high amount of fiber, such as fruits, vegetables, and grains.  Do not take Tylenol if you are taking prescription pain medications.  Follow up Your surgeon may want to see you in the office following your access surgery. If so, this will be arranged at the time of your surgery.  Please call us immediately for any of the following conditions:  . Increased pain, redness, drainage (pus) from your incision site . Fever of 101 degrees or higher . Severe or worsening pain at your incision site . Hand pain or numbness. .  Reduce your risk of vascular disease:  . Stop smoking. If you would like help, call [facility] at 1-800-QUIT-NOW 602-289-3372) or [place] at 917-264-7873  . Manage your cholesterol . Maintain a desired weight . Control your diabetes . Keep your blood pressure down  Dialysis  It will take several weeks to several months for your new dialysis access to be ready for use. Your surgeon will determine when it is okay to use it. Your nephrologist will continue to direct your dialysis. You can continue to use your Permcath until your new access is ready for use.   01/08/2021 KRRISH ROBLYER [id number] 22-Jun-1986  Surgeon(s): Early, Arvilla Meres, MD  Procedure(s): LEFT ARM ARTERIOVENOUS (AV) FISTULA CREATION    If you have any questions, please call the office at [phone].     General Anesthesia, Adult, Care After This sheet gives you information about  how to care for yourself after your procedure. Your health care provider may also give you more specific instructions. If you have problems or questions, contact your health care provider. What can I expect after the procedure? After the procedure, the following side effects are common:  Pain or discomfort at the IV site.  Nausea.  Vomiting.  Sore throat.  Trouble  concentrating.  Feeling cold or chills.  Feeling weak or tired.  Sleepiness and fatigue.  Soreness and body aches. These side effects can affect parts of the body that were not involved in surgery. Follow these instructions at home: For the time period you were told by your health care provider:  Rest.  Do not participate in activities where you could fall or become injured.  Do not drive or use machinery.  Do not drink alcohol.  Do not take sleeping pills or medicines that cause drowsiness.  Do not make important decisions or sign legal documents.  Do not take care of children on your own.   Eating and drinking  Follow any instructions from your health care provider about eating or drinking restrictions.  When you feel hungry, start by eating small amounts of foods that are soft and easy to digest (bland), such as toast. Gradually return to your regular diet.  Drink enough fluid to keep your urine pale yellow.  If you vomit, rehydrate by drinking water, juice, or clear broth. General instructions  If you have sleep apnea, surgery and certain medicines can increase your risk for breathing problems. Follow instructions from your health care provider about wearing your sleep device: ? Anytime you are sleeping, including during daytime naps. ? While taking prescription pain medicines, sleeping medicines, or medicines that make you drowsy.  Have a responsible adult stay with you for the time you are told. It is important to have someone help care for you until you are awake and alert.  Return to your normal activities as told by your health care provider. Ask your health care provider what activities are safe for you.  Take over-the-counter and prescription medicines only as told by your health care provider.  If you smoke, do not smoke without supervision.  Keep all follow-up visits as told by your health care provider. This is important. Contact a health care provider  if:  You have nausea or vomiting that does not get better with medicine.  You cannot eat or drink without vomiting.  You have pain that does not get better with medicine.  You are unable to pass urine.  You develop a skin rash.  You have a fever.  You have redness around your IV site that gets worse. Get help right away if:  You have difficulty breathing.  You have chest pain.  You have blood in your urine or stool, or you vomit blood. Summary  After the procedure, it is common to have a sore throat or nausea. It is also common to feel tired.  Have a responsible adult stay with you for the time you are told. It is important to have someone help care for you until you are awake and alert.  When you feel hungry, start by eating small amounts of foods that are soft and easy to digest (bland), such as toast. Gradually return to your regular diet.  Drink enough fluid to keep your urine pale yellow.  Return to your normal activities as told by your health care provider. Ask your health care provider what activities are safe for  you. This information is not intended to replace advice given to you by your health care provider. Make sure you discuss any questions you have with your health care provider. Document Revised: 05/22/2020 Document Reviewed: 12/20/2019 Elsevier Patient Education  2021 [place].     Acetaminophen; Oxycodone tablets What is this medicine? ACETAMINOPHEN; OXYCODONE (a set a MEE noe fen; ox i KOE done) is a pain reliever. It is used to treat moderate to severe pain. This medicine may be used for other purposes; ask your health care provider or pharmacist if you have questions. COMMON BRAND NAME(S): Endocet, Magnacet, Nalocet, Narvox, Percocet, Perloxx, Primalev, Primlev, Prolate, Roxicet, Xolox What should I tell my health care provider before I take this medicine? They need to know if you have any of these conditions:  brain tumor  drug abuse or  addiction  head injury  heart disease  if you often drink alcohol   kidney disease   liver disease  low adrenal gland function  lung disease, asthma, or breathing problem  seizures  stomach or intestine problems  taken an MAOI like Marplan, Nardil, or Parnate in the last 14 days  an unusual or allergic reaction to acetaminophen, oxycodone, other medicines, foods, dyes, or preservative  pregnant or trying to get pregnant  breast-feeding How should I use this medicine? Take this medicine by mouth with a full glass of water. Take it as directed on the label. You can take it with or without food. If it upsets your stomach, take it with food. Do not use it more often than directed. There may be unused or extra doses in the bottle after you finish your treatment. Talk to your health care provider if you have questions about your dose. A special MedGuide will be given to you by the pharmacist with each prescription and refill. Be sure to read this information carefully each time. Talk to your health care provider about the use of this medicine in children. Special care may be needed. Patients over 57 years of age may have a stronger reaction and need a smaller dose. Overdosage: If you think you have taken too much of this medicine contact a poison control center or emergency room at once. NOTE: This medicine is only for you. Do not share this medicine with others. What if I miss a dose? This does not apply. This medicine is not for regular use. It should only be used as needed. What may interact with this medicine? This medicine may interact with the following medications:  alcohol  antihistamines for allergy, cough and cold  antiviral medicines for HIV or AIDS  atropine  certain antibiotics like clarithromycin, erythromycin, linezolid, rifampin  certain medicines for anxiety or sleep  certain medicines for bladder problems like oxybutynin, tolterodine  certain  medicines for depression like amitriptyline, fluoxetine, sertraline  certain medicines for fungal infections like ketoconazole, itraconazole, voriconazole  certain medicines for migraine headache like almotriptan, eletriptan, frovatriptan, naratriptan, rizatriptan, sumatriptan, zolmitriptan  certain medicines for nausea or vomiting like dolasetron, ondansetron, palonosetron  certain medicines for Parkinson's disease like benztropine, trihexyphenidyl  certain medicines for seizures like phenobarbital, phenytoin, primidone  certain medicines for stomach problems like dicyclomine, hyoscyamine  certain medicines for travel sickness like scopolamine  diuretics  general anesthetics like halothane, isoflurane, methoxyflurane, propofol  ipratropium  local anesthetics like lidocaine, pramoxine, tetracaine  MAOIs like Carbex, Eldepryl, Marplan, Nardil, and Parnate  medicines that relax muscles for surgery  methylene blue  nilotinib  other medicines with acetaminophen  other narcotic medicines for pain or cough  phenothiazines like chlorpromazine, mesoridazine, prochlorperazine, thioridazine This list may not describe all possible interactions. Give your health care provider a list of all the medicines, herbs, non-prescription drugs, or dietary supplements you use. Also tell them if you smoke, drink alcohol, or use illegal drugs. Some items may interact with your medicine. What should I watch for while using this medicine? Tell your health care provider if your pain does not go away, if it gets worse, or if you have new or a different type of pain. You may develop tolerance to this drug. Tolerance means that you will need a higher dose of the drug for pain relief. Tolerance is normal and is expected if you take this drug for a long time. There are different types of narcotic drugs (opioids) for pain. If you take more than one type at the same time, you may have more side effects. Give  your health care provider a list of all drugs you use. He or she will tell you how much drug to take. Do not take more drug than directed. Get emergency help right away if you have problems breathing. Do not suddenly stop taking your drug because you may develop a severe reaction. Your body becomes used to the drug. This does NOT mean you are addicted. Addiction is a behavior related to getting and using a drug for a nonmedical reason. If you have pain, you have a medical reason to take pain drug. Your health care provider will tell you how much drug to take. If your health care provider wants you to stop the drug, the dose will be slowly lowered over time to avoid any side effects. Talk to your health care provider about naloxone and how to get it. Naloxone is an emergency drug used for an opioid overdose. An overdose can happen if you take too much opioid. It can also happen if an opioid is taken with some other drugs or substances, like alcohol. Know the symptoms of an overdose, like trouble breathing, unusually tired or sleepy, or not being able to respond or wake up. Make sure to tell caregivers and close contacts where it is stored. Make sure they know how to use it. After naloxone is given, you must get emergency help right away. Naloxone is a temporary treatment. Repeat doses may be needed. Do not take other drugs that contain acetaminophen with this drug. Many non-prescription drugs contain acetaminophen. Always read labels carefully. If you have questions, ask your health care provider. If you take too much acetaminophen, get medical help right away. Too much acetaminophen can be very dangerous and cause liver damage. Even if you do not have symptoms, it is important to get help right away. This drug does not prevent a heart attack or stroke. This drug may increase the chance of a heart attack or stroke. The chance may increase the longer you use this drug or if you have heart disease. If you take  aspirin to prevent a heart attack or stroke, talk to your health care provider about using this drug. You may get drowsy or dizzy. Do not drive, use machinery, or do anything that needs mental alertness until you know how this drug affects you. Do not stand up or sit up quickly, especially if you are an older patient. This reduces the risk of dizzy or fainting spells. Alcohol may interfere with the effect of this drug. Avoid alcoholic drinks. This drug will cause constipation. If  you do not have a bowel movement for 3 days, call your health care provider. Your mouth may get dry. Chewing sugarless gum or sucking hard candy and drinking plenty of water may help. Contact your health care provider if the problem does not go away or is severe. What side effects may I notice from receiving this medicine? Side effects that you should report to your doctor or health care professional as soon as possible:  allergic reactions (skin rash, itching or hives; swelling of the face, lips, or tongue)  confusion  kidney injury (trouble passing urine or change in the amount of urine)  light-colored stool  liver injury (dark yellow or brown urine; general ill feeling or flu-like symptoms; loss of appetite, right upper belly pain; unusually weak or tired, yellowing of the eyes or skin)  low adrenal gland function (nausea; vomiting; loss of appetite; unusually weak or tired; dizziness; low blood pressure)  low blood pressure (dizziness; feeling faint or lightheaded, falls; unusually weak or tired)  redness, blistering, peeling, or loosening of the skin, including inside the mouth  serotonin syndrome (irritable; confusion; diarrhea; fast or irregular heartbeat; muscle twitching; stiff muscles; trouble walking; sweating; high fever; seizures; chills; vomiting)  trouble breathing Side effects that usually do not require medical attention (report to your doctor or health care professional if they continue or are  bothersome):  constipation  dry mouth  nausea, vomiting  tiredness This list may not describe all possible side effects. Call your doctor for medical advice about side effects. You may report side effects to FDA at 1-800-FDA-1088. Where should I keep my medicine? Keep out of the reach of children and pets. This medicine can be abused. Keep it in a safe place to protect it from theft. Do not share it with anyone. It is only for you. Selling or giving away this medicine is dangerous and against the law. Store at room temperature between 20 and 25 degrees C (68 and 77 degrees F). Protect from light. Get rid of any unused medicine after the expiration date. This medicine may cause harm and death if it is taken by other adults, children, or pets. It is important to get rid of the medicine as soon as you no longer need it or it is expired. You can do this in two ways:  Take the medicine to a medicine take-back program. Check with your pharmacy or law enforcement to find a location.  If you cannot return the medicine, flush it down the toilet. NOTE: This sheet is a summary. It may not cover all possible information. If you have questions about this medicine, talk to your doctor, pharmacist, or health care provider.  2021 Elsevier/Gold Standard (2020-06-04 11:12:15)

## 2021-01-08 NOTE — Anesthesia Procedure Notes (Signed)
Date/Time: 01/08/2021 7:51 AM Performed by: [name], CRNA Pre-anesthesia Checklist: Patient identified, Emergency Drugs available, Suction available and Patient being monitored Patient Re-evaluated:Patient Re-evaluated prior to induction Oxygen Delivery Method: Nasal cannula Induction Type: IV induction Placement Confirmation: positive ETCO2

## 2021-01-08 NOTE — Transfer of Care (Signed)
Immediate Anesthesia Transfer of Care Note  Patient: LAURIE DEALMEIDA  Procedure(s) Performed: LEFT ARM ARTERIOVENOUS (AV) FISTULA CREATION (Left )  Patient Location: PACU  Anesthesia Type:General  Level of Consciousness: awake, alert  and oriented  Airway & Oxygen Therapy: Patient Spontanous Breathing  Post-op Assessment: Report given to RN and Post -op Vital signs reviewed and stable  Post vital signs: Reviewed and stable  Last Vitals:  Vitals Value Taken Time  BP 125/92 01/08/21 0902  Temp    Pulse    Resp 15 01/08/21 0904  SpO2    Vitals shown include unvalidated device data.  Last Pain:  Vitals:   01/08/21 0714  TempSrc: Oral  PainSc: 0-No pain      Patients Stated Pain Goal: 8 ([id number])  Complications: No complications documented.

## 2021-01-08 NOTE — Progress Notes (Signed)
Dr. Donnetta Hutching & Dr. Briant Cedar informed of K+ 5.4 & Glu 411 via [facility].  Both ok to proceed with procedure.

## 2021-01-08 NOTE — Anesthesia Postprocedure Evaluation (Signed)
Anesthesia Post Note  Patient: Nathaniel [name]  Procedure(s) Performed: LEFT ARM ARTERIOVENOUS (AV) FISTULA CREATION (Left )  Patient location during evaluation: Phase II Anesthesia Type: General Level of consciousness: awake Pain management: pain level controlled Vital Signs Assessment: post-procedure vital signs reviewed and stable Respiratory status: spontaneous breathing and respiratory function stable Cardiovascular status: blood pressure returned to baseline and stable Postop Assessment: no headache and no apparent nausea or vomiting Anesthetic complications: no Comments: Late entry   No complications documented.   Last Vitals:  Vitals:   01/08/21 0930 01/08/21 0945  BP: 107/82 (!) 155/94  Pulse: 83 95  Resp: 17 18  Temp:  36.7 C  SpO2: 100% 100%    Last Pain:  Vitals:   01/08/21 0945  TempSrc: Oral  PainSc: 0-No pain                 Louann Sjogren

## 2021-01-09 ENCOUNTER — Encounter (HOSPITAL_COMMUNITY): Payer: Self-pay | Admitting: Vascular Surgery

## 2021-01-13 ENCOUNTER — Other Ambulatory Visit: Payer: Self-pay

## 2021-01-13 ENCOUNTER — Ambulatory Visit (INDEPENDENT_AMBULATORY_CARE_PROVIDER_SITE_OTHER): Payer: Medicare HMO | Admitting: Endocrinology

## 2021-01-13 ENCOUNTER — Encounter: Payer: Self-pay | Admitting: Endocrinology

## 2021-01-13 VITALS — BP 110/64 | HR 97 | Ht 70.0 in | Wt 141.2 lb

## 2021-01-13 DIAGNOSIS — R630 Anorexia: Secondary | ICD-10-CM

## 2021-01-13 DIAGNOSIS — E1065 Type 1 diabetes mellitus with hyperglycemia: Secondary | ICD-10-CM | POA: Diagnosis not present

## 2021-01-13 NOTE — Progress Notes (Signed)
Patient ID: Nathaniel [name], male   DOB: [birth year], 35 y.o.   MRN: [id number]           Reason for Appointment : Follow-up for Type 1 Diabetes  History of Present Illness           Diagnosis: Type 1 diabetes mellitus, date of diagnosis:  ?  2004        Previous history:  He has had recurrent hospitalizations for ketoacidosis especially in 2017 and 2018, last episode was in 01/2018 Overall has had consistently poor control with A1c as high as 16 In the past he has required large doses of at least basal insulin, taking up to 200 units of Lantus daily at some point  Recent history:     INSULIN regimen is: Novolog 4-6 units before meals Tresiba 16-20 units daily  His A1c is last 11.9, previously 11  Current management, blood sugar patterns and problems identified:    Glucose monitoring:  He was able to get the Dexcom but used it only when this was started in the office and his mother was not able to start this on her own  She is also now adjusting his insulin  He has been taking 16-20 units of Tresiba but may be adjusting this on a daily basis  With this his blood sugars are again fluctuating between low and very high readings especially in the mornings  Not checking blood sugars twice a day but only in the mornings and before dinner and not after meals  Only taking small amounts of NovoLog and not clear if postprandial readings are controlled, his mother is giving him 4 to 6 units based on Premeal blood sugar  He is generally eating small meals and sometimes may not eat much at all  His mother thinks that his sugars get low overnight if he is not eating in the evening  Again usually not having symptoms of low sugars  Previous blood sugar analysis with the Dexcom shows the following data for 2 weeks and February Highly variable blood sugar patterns with some days blood sugars been persistently much above normal All other days blood sugars may drop longer after midnight  causing hypoglycemia usually between 6 AM and 11 AM No obvious increase in blood sugar after evening meals Hyperglycemia most significantly occurring between about 5 PM-9 PM and occasionally overnight  Glucose monitoring:  is being done mostly 1 times a day         Glucometer:  One Touch ultra 2       Blood Glucose readings and averages from meter download   PRE-MEAL Fasting Lunch Dinner Bedtime Overall  Glucose range:  32-527   41-474    Mean/median:  210   252   239   Previously:  PRE-MEAL  11 AM-5 PM  5 PM-10 PM  10 PM Bedtime Overall  Glucose range:  56-600+  67-600+  293  56=Hi  Median:  260  350    330    Hypoglycemia:  As above Symptoms of hypoglycemia: Feeling tired, sweaty, slurred speech, weakness Treatment of hypoglycemia: Juice usually, sometimes glucose tablets, usually 2 at a time     Self-care: The diet that the patient has been following is: None  Mealtimes are: Very variable              Dietician consultation: Most recent: 06/2018     CDE consultation: Years ago  Diabetes labs:  Lab Results  Component Value Date  HGBA1C 11.9 (A) 11/11/2020   HGBA1C 11.0 (H) 07/14/2020   HGBA1C 10.3 (A) 03/20/2020   Lab Results  Component Value Date   LDLCALC 128 (H) 03/19/2020   CREATININE 3.70 (H) 01/08/2021    No results found for: MICRALBCREAT  Wt Readings from Last 3 Encounters:  01/13/21 141 lb 3.2 oz (64 kg)  01/08/21 130 lb (59 kg)  12/15/20 136 lb (61.7 kg)     Allergies as of 01/13/2021      Reactions   Fructose Other (See Comments)   Increase of blood sugar      Medication List       Accurate as of January 13, 2021 11:59 PM. If you have any questions, ask your nurse or doctor.        acetaminophen 325 MG tablet Commonly known as: TYLENOL Take 1-2 tablets (325-650 mg total) by mouth every 4 (four) hours as needed for mild pain.   amLODipine 10 MG tablet Commonly known as: NORVASC Take 1 tablet (10 mg total) by mouth daily. For blood  pressure   aspirin EC 81 MG tablet Take 1 tablet (81 mg total) by mouth daily with breakfast. For stroke prevention   atorvastatin 80 MG tablet Commonly known as: LIPITOR Take 1 tablet (80 mg total) by mouth every evening. For stroke prevention   clopidogrel 75 MG tablet Commonly known as: PLAVIX Take 1 tablet (75 mg total) by mouth daily. For stroke prevention   Dexcom G6 Receiver Devi Check sugar 4 times a day   Dexcom G6 Sensor Misc 1 each by Does not apply route every 14 (fourteen) days.   Dexcom G6 Transmitter Misc Check sugar 4 times a day   DULoxetine 20 MG capsule Commonly known as: Cymbalta Take 1 capsule (20 mg total) by mouth daily.   insulin aspart 100 UNIT/ML FlexPen Commonly known as: NOVOLOG Inject per sliding scale before mealsI: Blood sugars between 200-250 Take 2 units, 250-300 take 3 units, Over 300 Take 4 units What changed:   how much to take  how to take this  when to take this   labetalol 100 MG tablet Commonly known as: NORMODYNE Take 1 tablet (100 mg total) by mouth 2 (two) times daily. For BP   lisinopril 40 MG tablet Commonly known as: ZESTRIL Take 1 tablet (40 mg total) by mouth daily. For blood pressure   OneTouch Ultra test strip Generic drug: glucose blood USE TO TEST BLOOD SUGAR 4 TIMES A DAY   oxyCODONE-acetaminophen 5-325 MG tablet Commonly known as: Percocet Take 1 tablet by mouth every 6 (six) hours as needed for severe pain.   Pen Needles 3/16" 31G X 5 MM Misc Four times day   promethazine 12.5 MG tablet Commonly known as: PHENERGAN Take 1 tablet (12.5 mg total) by mouth every 6 (six) hours as needed for nausea.   [name] FlexTouch 100 UNIT/ML FlexTouch Pen Generic drug: insulin degludec Inject up to 20 units max once per day What changed:   how much to take  how to take this  when to take this  additional instructions   Vitamin D (Ergocalciferol) 1.25 MG (50000 UNIT) Caps capsule Commonly known as:  DRISDOL Take 1 capsule (50,000 Units total) by mouth once a week.       Allergies:  Allergies  Allergen Reactions  . Fructose Other (See Comments)    Increase of blood sugar     Past Medical History:  Diagnosis Date  . Diabetes mellitus    lantus/novolog  .  Hyperlipidemia   . Hypertension   . Marijuana abuse    occaisionally  . Noncompliance with medication regimen   . Stroke ([place]) 2020  . Tobacco abuse    5/day    Past Surgical History:  Procedure Laterality Date  . AV FISTULA PLACEMENT Left 01/08/2021   Procedure: LEFT ARM ARTERIOVENOUS (AV) FISTULA CREATION;  Surgeon: [name], MD;  Location: [facility];  Service: Vascular;  Laterality: Left;  . EYE SURGERY      Family History  Problem Relation Age of Onset  . Diabetes Father   . Kidney failure Father        last 4 mnths of life  . Hypertension Mother   . Diabetes Maternal Grandmother   . Pancreatic cancer Maternal Grandfather   . Diabetes Paternal Grandfather     Social History:  reports that he has been smoking cigarettes. He has a 4.00 pack-year smoking history. He has never used smokeless tobacco. He reports current drug use. Drug: Marijuana. He reports that he does not drink alcohol.      Review of Systems      Lipids: After his CVA in June 2019 he was started on Lipitor 80 mg, last LDL as follows   Lab Results  Component Value Date   CHOL 198 03/19/2020   HDL 38.20 (L) 03/19/2020   LDLCALC 128 (H) 03/19/2020   TRIG 162.0 (H) 03/19/2020   CHOLHDL 5 03/19/2020   HYPERTENSION: Has been on treatment for several years from nephrologist  Blood pressure medications including lisinopril 40 mg, and amlodipine are being prescribed by his PCP   BP Readings from Last 3 Encounters:  01/13/21 110/64  01/08/21 (!) 155/94  12/15/20 (!) 143/93    Has chronic kidney disease, seen by nephrologist regularly and is due to start dialysis  Lab Results  Component Value Date   CREATININE 3.70 (H)  01/08/2021   CREATININE 3.68 (H) 07/16/2020   CREATININE 4.52 (H) 07/15/2020   He has had a thyroid nodule on the right side, ultrasound has been ordered a couple of times but he has not gone for this  Lab Results  Component Value Date   TSH 0.762 07/14/2020   Eye exams: Next due on 12/03/2020   Physical Examination:  BP 110/64   Pulse 97   Ht '5\' 10"'$  (1.778 m)   Wt 141 lb 3.2 oz (64 kg)   SpO2 98%   BMI 20.26 kg/m     ASSESSMENT:  Diabetes type 1, persistently poorly controlled  A1c is persistently high, last 11.9 compared to 11%  He still has overall extremely variable blood sugar patterns Some of the variability may be related to inconsistent diet, adjusting basal insulin more frequently than needed and inherent variability Blood sugars before breakfast and dinner only rarely in the normal range Unable to adjust his mealtime dose because of lack of postprandial monitoring recently  Again likely has periodic hypoglycemia by increasing [name] on a daily basis rather than waiting 3 days and now at least He will benefit from using the Dexcom and will need to be instructed on how to use this He now has the Dexcom sensor available and he will benefit from the continuous glucose monitoring with better adjustment of his basal and bolus insulin regimens Also likely may benefit from nutritional counseling especially with his weight loss which may be related to advanced renal failure  HISTORY of hypercholesterolemia: We will need follow-up labs  Decreased appetite: This is likely to be  from his renal failure and he can discuss adding nutritional supplements with nephrologist  PLAN:   He was started back on the Dexcom sensor today in the office under supervision  Discussed needing to pay attention to the high or low alerts  Discussed appropriate treatment of low blood sugars  Will also try to have balanced meals and avoid any sweet tea  Currently since he has tendency to  low blood sugars with relatively higher doses above 16 units he can try doing a stable dose of 16 units for at least 3 to 4 days  May go up to 18 units if blood sugars are over 200 for 3 days in a row  NovoLog will have to be adjusted to keep blood sugars from going over 200 postprandially or at least limit the increase  Will need to adjust Premeal doses by 1 unit for 50 points above 150  To check lipids on the next visit  Follow-up in 2 months   Patient Instructions  Tresiba 16 U daily but do 18 only if sugar over 200 for 3 days in a row  Check blood sugars on waking up 7  days a week  Also check blood sugars about 2 hours after meals and do this after different meals by rotation  Recommended blood sugar levels on waking up are 90-130 and about 2 hours after meal is 130-160  Please bring your blood sugar monitor to each visit, thank you  Bedtime snack if sugar <150       Elayne Snare 01/14/2021, 11:58 AM      Note: This note was prepared with Dragon voice recognition system technology. Any transcriptional errors that result from this process are unintentional.

## 2021-01-13 NOTE — Patient Instructions (Addendum)
Tresiba 16 U daily but do 18 only if sugar over 200 for 3 days in a row  Check blood sugars on waking up 7  days a week  Also check blood sugars about 2 hours after meals and do this after different meals by rotation  Recommended blood sugar levels on waking up are 90-130 and about 2 hours after meal is 130-160  Please bring your blood sugar monitor to each visit, thank you  Bedtime snack if sugar <150

## 2021-01-23 ENCOUNTER — Other Ambulatory Visit: Payer: Self-pay | Admitting: *Deleted

## 2021-01-23 DIAGNOSIS — N184 Chronic kidney disease, stage 4 (severe): Secondary | ICD-10-CM

## 2021-01-26 ENCOUNTER — Encounter: Payer: Self-pay | Admitting: Vascular Surgery

## 2021-01-26 ENCOUNTER — Ambulatory Visit (INDEPENDENT_AMBULATORY_CARE_PROVIDER_SITE_OTHER): Payer: Medicare HMO

## 2021-01-26 ENCOUNTER — Ambulatory Visit (INDEPENDENT_AMBULATORY_CARE_PROVIDER_SITE_OTHER): Payer: Medicare HMO | Admitting: Vascular Surgery

## 2021-01-26 ENCOUNTER — Other Ambulatory Visit: Payer: Self-pay

## 2021-01-26 VITALS — BP 131/89 | HR 113 | Temp 98.1°F | Resp 16 | Ht 70.0 in | Wt 135.8 lb

## 2021-01-26 DIAGNOSIS — N184 Chronic kidney disease, stage 4 (severe): Secondary | ICD-10-CM

## 2021-01-26 NOTE — Progress Notes (Signed)
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 male here today for follow-up.  He underwent left brachiobasilic fistula creation on 01/08/2021 at [facility].  In reviewing my operative note, he was felt to have a moderate size vein and a very small brachial artery.  He has no steal symptoms.  He has had good healing of his antecubital incision.  Current Outpatient Medications  Medication Sig Dispense Refill  . acetaminophen (TYLENOL) 325 MG tablet Take 1-2 tablets (325-650 mg total) by mouth every 4 (four) hours as needed for mild pain.    Marland Kitchen amLODipine (NORVASC) 10 MG tablet Take 1 tablet (10 mg total) by mouth daily. For blood pressure 90 tablet 11  . aspirin EC 81 MG tablet Take 1 tablet (81 mg total) by mouth daily with breakfast. For stroke prevention 30 tablet 11  . atorvastatin (LIPITOR) 80 MG tablet Take 1 tablet (80 mg total) by mouth every evening. For stroke prevention 90 tablet 11  . Continuous Blood Gluc Receiver (DEXCOM G6 RECEIVER) DEVI Check sugar 4 times a day 1 each 0  . Continuous Blood Gluc Sensor (DEXCOM G6 SENSOR) MISC 1 each by Does not apply route every 14 (fourteen) days. 9 each 3  . Continuous Blood Gluc Transmit (DEXCOM G6 TRANSMITTER) MISC Check sugar 4 times a day 1 each 3  . DULoxetine (CYMBALTA) 20 MG capsule Take 1 capsule (20 mg total) by mouth daily. 30 capsule 2  . glucose blood (ONETOUCH ULTRA) test strip USE TO TEST BLOOD SUGAR 4 TIMES A DAY 100 strip 3  . insulin aspart (NOVOLOG) 100 UNIT/ML FlexPen Inject per sliding scale before mealsI: Blood sugars between 200-250 Take 2 units, 250-300 take 3 units, Over 300 Take 4 units (Patient taking differently: Inject 2-4 Units into the skin See admin instructions. Inject per sliding scale  before mealsI: Blood sugars between 200-250 Take 2 units, 250-300 take 3 units, Over 300 Take 4 units) 15 mL 1  . insulin degludec (TRESIBA FLEXTOUCH) 100 UNIT/ML FlexTouch Pen Inject up to 20 units max once per day (Patient taking differently: Inject 18 Units into the skin daily.) 30 mL 1  . Insulin Pen Needle (PEN NEEDLES 3/16") 31G X 5 MM MISC Four times day 200 each 11  . labetalol (NORMODYNE) 100 MG tablet Take 1 tablet (100 mg total) by mouth 2 (two) times daily. For BP 180 tablet 4  . lisinopril (ZESTRIL) 40 MG tablet Take 1 tablet (40 mg total) by mouth daily. For blood pressure 90 tablet 3  . Vitamin D, Ergocalciferol, (DRISDOL) 1.25 MG (50000 UNIT) CAPS capsule Take 1 capsule (50,000 Units total) by mouth once a week. 5 capsule 2  . clopidogrel (PLAVIX) 75 MG tablet Take 1 tablet (75 mg total) by mouth daily. For stroke prevention (Patient not taking: No sig reported) 90 tablet 3  . oxyCODONE-acetaminophen (PERCOCET) 5-325 MG tablet Take 1 tablet by mouth every 6 (six) hours as needed for severe pain. (Patient not taking: Reported on 01/26/2021) 8 tablet 0  . promethazine (PHENERGAN) 12.5 MG tablet Take 1 tablet (12.5 mg total) by mouth every 6 (six) hours as needed for nausea. (Patient not taking: No sig reported) 12 tablet 0   No current facility-administered medications for this visit.     PHYSICAL EXAM: Vitals:  01/26/21 1355 01/26/21 1357  BP:  131/89  Pulse:  (!) 113  Resp:  16  Temp:  98.1 F (36.7 C)  SpO2:  98%  Weight:  135 lb 12.8 oz (61.6 kg)  Height: '5\' 10"'$  (1.778 m) '5\' 10"'$  (1.778 m)    GENERAL: The patient is a well-nourished male, in no acute distress. The vital signs are documented above. He has no thrill in his fistula.  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  He is not currently on hemodialysis.  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  I would not recommend any further attempts at fistula.  I feel that his only option will be a prosthetic Gore-Tex AV graft when the time comes.  He will continue his follow-up with Dr.Coladonato.  We will plan left arm graft placement when he approaches acute need for hemodialysis.   [name], MD FACS [facility] [phone]  Note: Portions of this report may have been transcribed using voice recognition software.  Every effort has been made to ensure accuracy; however, inadvertent computerized transcription errors may still be present.

## 2021-02-05 ENCOUNTER — Other Ambulatory Visit: Payer: Self-pay

## 2021-02-05 ENCOUNTER — Encounter (HOSPITAL_COMMUNITY)
Admission: RE | Admit: 2021-02-05 | Discharge: 2021-02-05 | Disposition: A | Discharge status: Home / Self Care | Payer: Medicare HMO | Source: Ambulatory Visit | Attending: Nephrology | Admitting: Nephrology

## 2021-02-05 DIAGNOSIS — N189 Chronic kidney disease, unspecified: Secondary | ICD-10-CM | POA: Insufficient documentation

## 2021-02-05 DIAGNOSIS — D631 Anemia in chronic kidney disease: Secondary | ICD-10-CM | POA: Diagnosis present

## 2021-02-05 MED ORDER — SODIUM CHLORIDE 0.9 % IV SOLN: INTRAVENOUS | Status: DC

## 2021-02-05 MED ORDER — SODIUM CHLORIDE 0.9 % IV SOLN
510.0000 mg | Freq: Once | INTRAVENOUS | Status: AC
  Administered 2021-02-05: 510 mg via INTRAVENOUS
  Filled 2021-02-05: qty 510

## 2021-02-12 ENCOUNTER — Encounter (HOSPITAL_COMMUNITY)
Admission: RE | Admit: 2021-02-12 | Discharge: 2021-02-12 | Disposition: A | Discharge status: Home / Self Care | Payer: Medicare HMO | Source: Ambulatory Visit | Attending: Nephrology | Admitting: Nephrology

## 2021-02-12 ENCOUNTER — Other Ambulatory Visit: Payer: Self-pay

## 2021-02-12 DIAGNOSIS — N189 Chronic kidney disease, unspecified: Secondary | ICD-10-CM | POA: Diagnosis not present

## 2021-02-12 MED ORDER — SODIUM CHLORIDE 0.9 % IV SOLN: INTRAVENOUS | Status: DC

## 2021-02-12 MED ORDER — SODIUM CHLORIDE 0.9 % IV SOLN
510.0000 mg | Freq: Once | INTRAVENOUS | Status: AC
  Administered 2021-02-12: 510 mg via INTRAVENOUS
  Filled 2021-02-12: qty 17

## 2021-03-17 ENCOUNTER — Ambulatory Visit: Payer: Medicare HMO | Admitting: Endocrinology

## 2021-03-19 ENCOUNTER — Encounter: Payer: Medicare HMO | Attending: Family Medicine | Admitting: Dietician

## 2021-03-19 ENCOUNTER — Encounter: Payer: Self-pay | Admitting: Endocrinology

## 2021-03-19 ENCOUNTER — Ambulatory Visit (INDEPENDENT_AMBULATORY_CARE_PROVIDER_SITE_OTHER): Payer: Medicare HMO | Admitting: Endocrinology

## 2021-03-19 ENCOUNTER — Other Ambulatory Visit: Payer: Self-pay

## 2021-03-19 VITALS — BP 122/88 | HR 94 | Ht 71.0 in | Wt 133.8 lb

## 2021-03-19 DIAGNOSIS — E46 Unspecified protein-calorie malnutrition: Secondary | ICD-10-CM | POA: Diagnosis not present

## 2021-03-19 DIAGNOSIS — E1065 Type 1 diabetes mellitus with hyperglycemia: Secondary | ICD-10-CM

## 2021-03-19 DIAGNOSIS — E1010 Type 1 diabetes mellitus with ketoacidosis without coma: Secondary | ICD-10-CM

## 2021-03-19 LAB — POCT GLYCOSYLATED HEMOGLOBIN (HGB A1C): Hemoglobin A1C: 9.6 % — AB (ref 4.0–5.6)

## 2021-03-19 NOTE — Progress Notes (Signed)
Dexcom G6 Personal CGM Training  Start time:1640    End time: 17 Total time: Nathaniel [name] and his mother were re educated about the following:  -Getting to know device    IT trainer) -Setting up device (high alert  , low alert) - previously programed -Inserting sensor ( left upper arm   WNL) -Calibrating- none required for G6 -Ending sensor session -Trouble shooting -Tape guide, clarity information (no previous problems sticking or skin reactions) -Reviewed to double check glucose with finger check if symptoms do not match number.  Patient has [employer] Teacher, [name].  Darrol Jump, RD 03/19/2021 5:20 PM.

## 2021-03-19 NOTE — Patient Instructions (Signed)
Novolog just after finishing supper 12 units for full meals  8-10 for average meal  SUGAR AFTER MEALS <200  TRESIBA 13 units and go up only if am sugar stays >150 in am for 4 days  Check blood sugars on waking up 7 days a week  Also check blood sugars about 2 hours after meals and do this after different meals by rotation  Recommended blood sugar levels on waking up are 90-130 and about 2 hours after meal is 130-180  Please bring your blood sugar monitor to each visit, thank you

## 2021-03-19 NOTE — Progress Notes (Signed)
Patient ID: Nathaniel [name], male   DOB: July 03, 1986, 35 y.o.   MRN: [id number]           Reason for Appointment : Follow-up for Type 1 Diabetes  History of Present Illness           Diagnosis: Type 1 diabetes mellitus, date of diagnosis:  ?  2004        Previous history:  He has had recurrent hospitalizations for ketoacidosis especially in 2017 and 2018, last episode was in 01/2018 Overall has had consistently poor control with A1c as high as 16 In the past he has required large doses of at least basal insulin, taking up to 200 units of Lantus daily at some point  Recent history:     INSULIN regimen is: Novolog 6-8 units before meals Tresiba 16-18 units daily  His A1c is 9.6 compared to 11.9   Current management, blood sugar patterns and problems identified:    Glucose monitoring: He was started back on the Dexcom sensor but his mother says that the sensor failed and despite instruction she was not able to figure out how to start a new sensor, she did not call Checking blood sugars mostly twice a day now with fingersticks Her mother is now adjusting his insulin He has been taking 16-18 units of Tresiba with higher doses when blood sugar is over 300 However FASTING blood sugars are highly erratic He has frequently significant hypoglycemia in the mornings even though his average is nearly 200  Despite instructions he is not taking enough NovoLog insulin at suppertime This is despite instructions on how to adjust insulin, timing of insulin and blood sugar targets after meals He states that his evening sugars are after dinner and recently they are consistently over 300 Overall he may sometimes eat a big meal such as a full sandwich Has been advised to stay away from regular soft drinks Occasionally may be with the sugars are again fluctuating between low and very high readings especially in the mornings Not clear if he is eating more than 1 meal a day, mostly eating in the  evening after 7 PM As before usually not having symptoms of low sugars  Previous blood sugar analysis with the Dexcom sensor in February showed the following: Highly variable blood sugar patterns with some days blood sugars been persistently much above normal All other days blood sugars may drop longer after midnight causing hypoglycemia usually between 6 AM and 11 AM No obvious increase in blood sugar after evening meals Hyperglycemia most significantly occurring between about 5 PM-9 PM and occasionally overnight  Glucose monitoring:  is being done mostly 1 times a day         Glucometer:  One Touch ultra 2       Blood Glucose readings and averages from meter download   PRE-MEAL Fasting Lunch Dinner Bedtime Overall  Glucose range: 36-600+      Mean/median: 200    236   POST-MEAL PC Breakfast PC Lunch PC Dinner  Glucose range:   36-600+  Mean/median:   325     PRE-MEAL Fasting Lunch Dinner Bedtime Overall  Glucose range:  32-527   41-474    Mean/median:  210   252   239       Hypoglycemia:  As above Symptoms of hypoglycemia: Feeling tired, sweaty, slurred speech, weakness Treatment of hypoglycemia: Juice usually, sometimes glucose tablets, usually 2 at a time     Self-care: The diet that the  patient has been following is: None  Mealtimes are: Very variable              Dietician consultation: Most recent: 06/2018     CDE consultation: Years ago  Diabetes labs:  Lab Results  Component Value Date   HGBA1C 9.6 (A) 03/19/2021   HGBA1C 11.9 (A) 11/11/2020   HGBA1C 11.0 (H) 07/14/2020   Lab Results  Component Value Date   LDLCALC 128 (H) 03/19/2020   CREATININE 3.70 (H) 01/08/2021    No results found for: MICRALBCREAT  Wt Readings from Last 3 Encounters:  03/19/21 133 lb 12.8 oz (60.7 kg)  01/26/21 135 lb 12.8 oz (61.6 kg)  01/13/21 141 lb 3.2 oz (64 kg)     Allergies as of 03/19/2021       Reactions   Fructose Other (See Comments)   Increase of blood  sugar        Medication List        Accurate as of March 19, 2021  8:29 PM. If you have any questions, ask your nurse or doctor.          STOP taking these medications    oxyCODONE-acetaminophen 5-325 MG tablet Commonly known as: Percocet Stopped by: Elayne Snare, MD       TAKE these medications    acetaminophen 325 MG tablet Commonly known as: TYLENOL Take 1-2 tablets (325-650 mg total) by mouth every 4 (four) hours as needed for mild pain.   amLODipine 10 MG tablet Commonly known as: NORVASC Take 1 tablet (10 mg total) by mouth daily. For blood pressure   aspirin EC 81 MG tablet Take 1 tablet (81 mg total) by mouth daily with breakfast. For stroke prevention   atorvastatin 80 MG tablet Commonly known as: LIPITOR Take 1 tablet (80 mg total) by mouth every evening. For stroke prevention   clopidogrel 75 MG tablet Commonly known as: PLAVIX Take 1 tablet (75 mg total) by mouth daily. For stroke prevention   Dexcom G6 Receiver Devi Check sugar 4 times a day   Dexcom G6 Sensor Misc 1 each by Does not apply route every 14 (fourteen) days.   Dexcom G6 Transmitter Misc Check sugar 4 times a day   DULoxetine 20 MG capsule Commonly known as: Cymbalta Take 1 capsule (20 mg total) by mouth daily.   insulin aspart 100 UNIT/ML FlexPen Commonly known as: NOVOLOG Inject per sliding scale before mealsI: Blood sugars between 200-250 Take 2 units, 250-300 take 3 units, Over 300 Take 4 units What changed:  how much to take how to take this when to take this   labetalol 100 MG tablet Commonly known as: NORMODYNE Take 1 tablet (100 mg total) by mouth 2 (two) times daily. For BP   lisinopril 40 MG tablet Commonly known as: ZESTRIL Take 1 tablet (40 mg total) by mouth daily. For blood pressure   OneTouch Ultra test strip Generic drug: glucose blood USE TO TEST BLOOD SUGAR 4 TIMES A DAY   Pen Needles 3/16" 31G X 5 MM Misc Four times day   promethazine 12.5 MG  tablet Commonly known as: PHENERGAN Take 1 tablet (12.5 mg total) by mouth every 6 (six) hours as needed for nausea.   [name] FlexTouch 100 UNIT/ML FlexTouch Pen Generic drug: insulin degludec Inject up to 20 units max once per day What changed:  how much to take how to take this when to take this additional instructions   Vitamin D (Ergocalciferol) 1.25 MG (50000  UNIT) Caps capsule Commonly known as: DRISDOL Take 1 capsule (50,000 Units total) by mouth once a week.        Allergies:  Allergies  Allergen Reactions   Fructose Other (See Comments)    Increase of blood sugar     Past Medical History:  Diagnosis Date   Diabetes mellitus    lantus/novolog   Hyperlipidemia    Hypertension    Marijuana abuse    occaisionally   Noncompliance with medication regimen    Stroke ([place]) 2020   Tobacco abuse    5/day    Past Surgical History:  Procedure Laterality Date   AV FISTULA PLACEMENT Left 01/08/2021   Procedure: LEFT ARM ARTERIOVENOUS (AV) FISTULA CREATION;  Surgeon: [name], MD;  Location: [facility];  Service: Vascular;  Laterality: Left;   EYE SURGERY      Family History  Problem Relation Age of Onset   Diabetes Father    Kidney failure Father        last 78 mnths of life   Hypertension Mother    Diabetes Maternal Grandmother    Pancreatic cancer Maternal Grandfather    Diabetes Paternal Grandfather     Social History:  reports that he has been smoking cigarettes. He has a 4.00 pack-year smoking history. He has never used smokeless tobacco. He reports current drug use. Drug: Marijuana. He reports that he does not drink alcohol.      Review of Systems      Lipids: After his CVA in June 2019 he was started on Lipitor 80 mg, last LDL as follows   Lab Results  Component Value Date   CHOL 198 03/19/2020   HDL 38.20 (L) 03/19/2020   LDLCALC 128 (H) 03/19/2020   TRIG 162.0 (H) 03/19/2020   CHOLHDL 5 03/19/2020   HYPERTENSION: Has been on treatment  for several years from nephrologist  Blood pressure medications including lisinopril 40 mg, and amlodipine are being prescribed by his PCP   BP Readings from Last 3 Encounters:  03/19/21 122/88  02/12/21 (!) 150/97  01/26/21 131/89    Has chronic kidney disease, seen by nephrologist regularly and is due to start dialysis, recent shunt has failed on the left arm  Lab Results  Component Value Date   CREATININE 3.70 (H) 01/08/2021   CREATININE 3.68 (H) 07/16/2020   CREATININE 4.52 (H) 07/15/2020   He has had a thyroid nodule on the right side, ultrasound has been ordered a couple of times but he has not gone for this  Lab Results  Component Value Date   TSH 0.762 07/14/2020      Physical Examination:  BP 122/88   Pulse 94   Ht '5\' 11"'$  (1.803 m)   Wt 133 lb 12.8 oz (60.7 kg)   SpO2 99%   BMI 18.66 kg/m   He looks asthenic  ASSESSMENT:  Diabetes type 1, persistently poorly controlled  A1c is 9.6 and not as high, last 11.9   He still has extremely labile blood sugars Current blood sugar patterns indicate likely excessive basal insulin and inadequate mealtime coverage Has difficulty understanding how to take mealtime coverage and will not take enough insulin for fear of hypoglycemia He did not let us know that he had difficulty with the Dexcom and has not used it since his last visit  Again with his renal dysfunction insulin kinetics may be altered  HISTORY of hypercholesterolemia: We will need follow-up labs to make sure he is taking atorvastatin regularly  PLAN:  He was started back on the Dexcom sensor today in the office by the diabetes educator Discussed taking the mealtime insulin right after finishing eating's so that he can better judge how much food he has taken in and adjust insulin accordingly However he should take at least 8 to 10 units for an average meal and likely 12 or more units for large meals We will adjust this further based on how his blood  sugars are after meals Fasting and postprandial blood sugar readings need to be checked regularly with the Dexcom and targets discussed We will back down to 13 units on the [name] and only titrate if his blood sugars are consistently over 150 for 4 days at least Again written instructions given Also will need relatively higher doses for high fat meals Follow-up in 2 months   Patient Instructions  Novolog just after finishing supper 12 units for full meals  8-10 for average meal  SUGAR AFTER MEALS <200  TRESIBA 13 units and go up only if am sugar stays >150 in am for 4 days  Check blood sugars on waking up 7 days a week  Also check blood sugars about 2 hours after meals and do this after different meals by rotation  Recommended blood sugar levels on waking up are 90-130 and about 2 hours after meal is 130-180  Please bring your blood sugar monitor to each visit, thank you       Elayne Snare 03/19/2021, 8:29 PM      Note: This note was prepared with Dragon voice recognition system technology. Any transcriptional errors that result from this process are unintentional.

## 2021-03-20 ENCOUNTER — Telehealth: Payer: Self-pay | Admitting: Dietician

## 2021-05-18 ENCOUNTER — Ambulatory Visit (INDEPENDENT_AMBULATORY_CARE_PROVIDER_SITE_OTHER): Payer: Medicare HMO | Admitting: Endocrinology

## 2021-05-18 ENCOUNTER — Encounter: Payer: Self-pay | Admitting: Endocrinology

## 2021-05-18 ENCOUNTER — Other Ambulatory Visit: Payer: Self-pay

## 2021-05-18 VITALS — BP 108/64 | HR 90 | Ht 71.0 in | Wt 133.0 lb

## 2021-05-18 DIAGNOSIS — E7800 Pure hypercholesterolemia, unspecified: Secondary | ICD-10-CM | POA: Diagnosis not present

## 2021-05-18 DIAGNOSIS — E1065 Type 1 diabetes mellitus with hyperglycemia: Secondary | ICD-10-CM

## 2021-05-18 NOTE — Patient Instructions (Signed)
NOVOLOG instructions This must be done consistently right after finishing supper REGARDLESS of the blood sugar level The dose will be between 6-10 units based on how much he is eating This must be given even when the blood sugars are near 100.   Need to keep the SUGAR AFTER MEALS UNDER 200 If the blood sugar is still going up over 200 after meals may need to go up to as much as 12 or 14 units for larger meals  CORRECTION DOSES for high sugars may be given 3 to 4 hours after supper as follows Blood sugar over 200: 2 extra units Blood sugar over 300: 4 extra units Blood sugar over 400: 5 extra units   TRESIBA 14 units to be given at the same time as the NovoLog  Only increase the dose to 16 units if the blood sugar is over 200 on waking up for 3 days in a row If the blood sugars on waking up are below 100 then reduce the dose by 2 units

## 2021-05-18 NOTE — Progress Notes (Signed)
Patient ID: Nathaniel [name], male   DOB: [birth year], 35 y.o.   MRN: [id number]           Reason for Appointment : Follow-up for Type 1 Diabetes  History of Present Illness           Diagnosis: Type 1 diabetes mellitus, date of diagnosis:  ?  2004        Previous history:  He has had recurrent hospitalizations for ketoacidosis especially in 2017 and 2018, last episode was in 01/2018 Overall has had consistently poor control with A1c as high as 16 In the past he has required large doses of at least basal insulin, taking up to 200 units of Lantus daily at some point  Recent history:     INSULIN regimen is: Novolog 6-8 units before meals Tresiba 12-16 units daily  His A1c is last 9.6 compared to 11.9   Current management, blood sugar patterns and problems identified:    Glucose monitoring: He was started back on the Dexcom sensor with the instructions being given by diabetes educator on the last visit  Since then his mother says that the sensor has been working well and consistently although she is not able to use a smart phone for this  Blood sugars are still poorly controlled with only 26% of readings within target range and tendency to frequent hyperglycemia  Most of this is from missed or inadequate doses of NovoLog at mealtimes  His mother is afraid of giving him any insulin when blood sugars are normal at mealtimes since sometimes he does not eat his meals are very little  Also appears to be getting tendency to low normal blood sugars are HYPOGLYCEMIA from taking correction doses of insulin postprandially or late at night; she is giving him as much as 8 units for correction However overall hypoglycemia is less Generally taking still 6 to 8 units of NovoLog at mealtimes instead of increasing it as directed Last night with eating 2 cheeseburgers he only took 6 units and blood sugars subsequently were significantly high into the night Weight is about the same Only not eating  more than 1 meal a day, mostly eating in the evening after 7 PM and only rarely late at night As before usually not having symptoms of low sugars TRESIBA is being given mostly at bedtime based on the blood sugar level between 12-16 units, previously has been instructed to adjust this only every 4 days based on fasting blood sugars Also his mother says that sometimes of the blood sugar is near normal or low she will skip the [name] completely  Interpretation of the Dexcom sensor download for the last 2 weeks is as follows  He has extreme variability in his blood sugars at all times  Also on an average blood sugars are above the target range of 180 at all times HIGHEST blood sugars on an average are between about 10 PM-1 AM HYPOGLYCEMIA is present only occasionally around 8-10 AM and transiently around 6 PM, usually hypoglycemia is preceded by high readings.  Also hypoglycemia baby long-lasting at times Overnight blood sugars are highly variable but mostly high, blood sugars were trending down gradually after midnight on 3 occasions and decreased significantly into hypoglycemic range on the night of 8/28  Statistics:  CGM use % of time 100  2-week average/GV 238/27  Time in range       26%  % Time Above 180 24  % Time above 250 46  % Time  Below 70 4     HIGHEST average blood sugar 296 at 11 PM and lowest average blood sugar 205 at 4-5 PM  Previous data:  PRE-MEAL Fasting Lunch Dinner Bedtime Overall  Glucose range: 36-600+      Mean/median: 200    236   POST-MEAL PC Breakfast PC Lunch PC Dinner  Glucose range:   36-600+  Mean/median:   325     PRE-MEAL Fasting Lunch Dinner Bedtime Overall  Glucose range:  32-527   41-474    Mean/median:  210   252   239       Hypoglycemia:  As above Symptoms of hypoglycemia: Feeling tired, sweaty, slurred speech, weakness Treatment of hypoglycemia: Juice usually, sometimes glucose tablets, usually 2 at a time     Self-care: The diet  that the patient has been following is: None  Mealtimes are: Very variable              Dietician consultation: Most recent: 06/2018     CDE consultation: Years ago  Diabetes labs:  Lab Results  Component Value Date   HGBA1C 9.6 (A) 03/19/2021   HGBA1C 11.9 (A) 11/11/2020   HGBA1C 11.0 (H) 07/14/2020   Lab Results  Component Value Date   LDLCALC 128 (H) 03/19/2020   CREATININE 3.70 (H) 01/08/2021    No results found for: MICRALBCREAT  Wt Readings from Last 3 Encounters:  05/18/21 133 lb (60.3 kg)  03/19/21 133 lb 12.8 oz (60.7 kg)  01/26/21 135 lb 12.8 oz (61.6 kg)     Allergies as of 05/18/2021       Reactions   Fructose Other (See Comments)   Increase of blood sugar        Medication List        Accurate as of May 18, 2021  8:59 PM. If you have any questions, ask your nurse or doctor.          acetaminophen 325 MG tablet Commonly known as: TYLENOL Take 1-2 tablets (325-650 mg total) by mouth every 4 (four) hours as needed for mild pain.   amLODipine 10 MG tablet Commonly known as: NORVASC Take 1 tablet (10 mg total) by mouth daily. For blood pressure   aspirin EC 81 MG tablet Take 1 tablet (81 mg total) by mouth daily with breakfast. For stroke prevention   atorvastatin 80 MG tablet Commonly known as: LIPITOR Take 1 tablet (80 mg total) by mouth every evening. For stroke prevention   clopidogrel 75 MG tablet Commonly known as: PLAVIX Take 1 tablet (75 mg total) by mouth daily. For stroke prevention   Dexcom G6 Receiver Devi Check sugar 4 times a day   Dexcom G6 Sensor Misc 1 each by Does not apply route every 14 (fourteen) days.   Dexcom G6 Transmitter Misc Check sugar 4 times a day   DULoxetine 20 MG capsule Commonly known as: Cymbalta Take 1 capsule (20 mg total) by mouth daily.   insulin aspart 100 UNIT/ML FlexPen Commonly known as: NOVOLOG Inject per sliding scale before mealsI: Blood sugars between 200-250 Take 2 units,  250-300 take 3 units, Over 300 Take 4 units What changed:  how much to take how to take this when to take this   labetalol 100 MG tablet Commonly known as: NORMODYNE Take 1 tablet (100 mg total) by mouth 2 (two) times daily. For BP   lisinopril 40 MG tablet Commonly known as: ZESTRIL Take 1 tablet (40 mg total) by mouth daily. For  blood pressure   OneTouch Ultra test strip Generic drug: glucose blood USE TO TEST BLOOD SUGAR 4 TIMES A DAY   Pen Needles 3/16" 31G X 5 MM Misc Four times day   promethazine 12.5 MG tablet Commonly known as: PHENERGAN Take 1 tablet (12.5 mg total) by mouth every 6 (six) hours as needed for nausea.   [name] FlexTouch 100 UNIT/ML FlexTouch Pen Generic drug: insulin degludec Inject up to 20 units max once per day What changed:  how much to take how to take this when to take this additional instructions   Vitamin D (Ergocalciferol) 1.25 MG (50000 UNIT) Caps capsule Commonly known as: DRISDOL Take 1 capsule (50,000 Units total) by mouth once a week.        Allergies:  Allergies  Allergen Reactions   Fructose Other (See Comments)    Increase of blood sugar     Past Medical History:  Diagnosis Date   Diabetes mellitus    lantus/novolog   Hyperlipidemia    Hypertension    Marijuana abuse    occaisionally   Noncompliance with medication regimen    Stroke ([place]) 2020   Tobacco abuse    5/day    Past Surgical History:  Procedure Laterality Date   AV FISTULA PLACEMENT Left 01/08/2021   Procedure: LEFT ARM ARTERIOVENOUS (AV) FISTULA CREATION;  Surgeon: [name], MD;  Location: [facility];  Service: Vascular;  Laterality: Left;   EYE SURGERY      Family History  Problem Relation Age of Onset   Diabetes Father    Kidney failure Father        last 63 mnths of life   Hypertension Mother    Diabetes Maternal Grandmother    Pancreatic cancer Maternal Grandfather    Diabetes Paternal Grandfather     Social History:  reports that  he has been smoking cigarettes. He has a 4.00 pack-year smoking history. He has never used smokeless tobacco. He reports current drug use. Drug: Marijuana. He reports that he does not drink alcohol.      Review of Systems      Lipids: After his CVA in June 2019 he was started on Lipitor 80 mg Not clear if he has had any follow-up with his PCP, no labs available for the last year   Lab Results  Component Value Date   CHOL 198 03/19/2020   HDL 38.20 (L) 03/19/2020   LDLCALC 128 (H) 03/19/2020   TRIG 162.0 (H) 03/19/2020   CHOLHDL 5 03/19/2020   HYPERTENSION: Has been on treatment for several years from nephrologist  Blood pressure medications including lisinopril 40 mg, and amlodipine are being prescribed by his PCP   BP Readings from Last 3 Encounters:  05/18/21 108/64  03/19/21 122/88  02/12/21 (!) 150/97    Has chronic kidney disease, seen by nephrologist regularly and is due to start dialysis, no recent records available  Lab Results  Component Value Date   CREATININE 3.70 (H) 01/08/2021   CREATININE 3.68 (H) 07/16/2020   CREATININE 4.52 (H) 07/15/2020   He has had a thyroid nodule on the right side, ultrasound has been ordered a couple of times but he has not gone for this  Lab Results  Component Value Date   TSH 0.762 07/14/2020      Physical Examination:  BP 108/64   Pulse 90   Ht '5\' 11"'$  (1.803 m)   Wt 133 lb (60.3 kg)   SpO2 97%   BMI 18.55 kg/m  ASSESSMENT:  Diabetes type 1, persistently poorly controlled  A1c is on the last visit 9.6   Although he is using the Dexcom sensor more consistently now his blood sugars are poorly controlled with mostly high readings as discussed above in detail  Hyperglycemia is related to inadequate mealtime insulin and at times inadequate or skipped doses of TRESIBA Average blood sugar at any given time is usually over 200 and highest after dinner Hypoglycemia has been occurring periodically but mostly due to  overcorrection from insulin taking at the wrong time  HISTORY of hypercholesterolemia: We will need follow-up labs today to make sure he is taking atorvastatin regularly  PLAN:  He will need to take more insulin to cover his meals especially if they are high fat Will also need correction doses postprandially if blood sugars are unexpectedly high Insulin can be given right after eating for better assessment of his actual food intake [name] can be done at the same time as the NovoLog and adjusted only based on fasting readings Detailed instructions were reviewed with his mother Follow-up in 2 months  Patient Instructions  NOVOLOG instructions This must be done consistently right after finishing supper REGARDLESS of the blood sugar level The dose will be between 6-10 units based on how much he is eating This must be given even when the blood sugars are near 100.   Need to keep the SUGAR AFTER MEALS UNDER 200 If the blood sugar is still going up over 200 after meals may need to go up to as much as 12 or 14 units for larger meals  CORRECTION DOSES for high sugars may be given 3 to 4 hours after supper as follows Blood sugar over 200: 2 extra units Blood sugar over 300: 4 extra units Blood sugar over 400: 5 extra units   TRESIBA 14 units to be given at the same time as the NovoLog  Only increase the dose to 16 units if the blood sugar is over 200 on waking up for 3 days in a row If the blood sugars on waking up are below 100 then reduce the dose by 2 units    Patient Instructions  NOVOLOG instructions This must be done consistently right after finishing supper REGARDLESS of the blood sugar level The dose will be between 6-10 units based on how much he is eating This must be given even when the blood sugars are near 100.   Need to keep the SUGAR AFTER MEALS UNDER 200 If the blood sugar is still going up over 200 after meals may need to go up to as much as 12 or 14 units for larger  meals  CORRECTION DOSES for high sugars may be given 3 to 4 hours after supper as follows Blood sugar over 200: 2 extra units Blood sugar over 300: 4 extra units Blood sugar over 400: 5 extra units   TRESIBA 14 units to be given at the same time as the NovoLog  Only increase the dose to 16 units if the blood sugar is over 200 on waking up for 3 days in a row If the blood sugars on waking up are below 100 then reduce the dose by 2 units     Total visit time for management of diabetes and counseling regarding insulin regimen and avoidance of hyperglycemia and hypoglycemia = 45 minutes   Elayne Snare 05/18/2021, 8:59 PM      Note: This note was prepared with Dragon voice recognition system technology. Any transcriptional  errors that result from this process are unintentional.

## 2021-05-19 LAB — LIPID PANEL
Cholesterol: 204 mg/dL — ABNORMAL HIGH (ref 0–200)
HDL: 37.6 mg/dL — ABNORMAL LOW (ref >39.00)
LDL Cholesterol: 138 mg/dL — ABNORMAL HIGH (ref 0–99)
NonHDL: 165.9
Total CHOL/HDL Ratio: 5
Triglycerides: 142 mg/dL (ref 0.0–149.0)
VLDL: 28.4 mg/dL (ref 0.0–40.0)

## 2021-05-19 LAB — ALT: ALT: 9 U/L (ref 0–53)

## 2021-06-18 ENCOUNTER — Ambulatory Visit (INDEPENDENT_AMBULATORY_CARE_PROVIDER_SITE_OTHER): Payer: Medicare HMO | Admitting: Ophthalmology

## 2021-06-18 ENCOUNTER — Other Ambulatory Visit: Payer: Self-pay

## 2021-06-18 ENCOUNTER — Encounter (INDEPENDENT_AMBULATORY_CARE_PROVIDER_SITE_OTHER): Payer: Self-pay | Admitting: Ophthalmology

## 2021-06-18 DIAGNOSIS — E103552 Type 1 diabetes mellitus with stable proliferative diabetic retinopathy, left eye: Secondary | ICD-10-CM

## 2021-06-18 DIAGNOSIS — E103591 Type 1 diabetes mellitus with proliferative diabetic retinopathy without macular edema, right eye: Secondary | ICD-10-CM

## 2021-06-18 NOTE — Assessment & Plan Note (Signed)
No evidence of active macular edema OS

## 2021-06-18 NOTE — Assessment & Plan Note (Signed)
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

## 2021-06-18 NOTE — Progress Notes (Signed)
06/18/2021     CHIEF COMPLAINT Patient presents for  Chief Complaint  Patient presents with   Retina Follow Up    PRP OD Pt denies changes or issues since last visit.      HISTORY OF PRESENT ILLNESS: Nathaniel [name] is a 35 y.o. male who presents to the clinic today for:   HPI     Retina Follow Up   Patient presents with  Diabetic Retinopathy.  In right eye.  This started 6 months ago.  Severity is severe.  Duration of 6 months.  Since onset it is stable.  I, the attending physician,  performed the HPI with the patient and updated documentation appropriately. Additional comments: PRP OD Pt denies changes or issues since last visit.        Comments   6 mos fu ou fp. Patient states vision is stable and unchanged since last visit. Denies any new floaters or FOL. LBS: Pt does not know what BS was this morning. Pt states "the machine was saying it needs to be changed." A1C: 03/19/2021, 9.6. Unsure of next checkup.      Last edited by Laurin Coder on 06/18/2021  2:06 PM.      Referring physician: [name], MD [place],  [place] [address]  HISTORICAL INFORMATION:   Selected notes from the MEDICAL RECORD NUMBER    Lab Results  Component Value Date   HGBA1C 9.6 (A) 03/19/2021     CURRENT MEDICATIONS: No current outpatient medications on file. (Ophthalmic Drugs)   No current facility-administered medications for this visit. (Ophthalmic Drugs)   Current Outpatient Medications (Other)  Medication Sig   acetaminophen (TYLENOL) 325 MG tablet Take 1-2 tablets (325-650 mg total) by mouth every 4 (four) hours as needed for mild pain.   amLODipine (NORVASC) 10 MG tablet Take 1 tablet (10 mg total) by mouth daily. For blood pressure   aspirin EC 81 MG tablet Take 1 tablet (81 mg total) by mouth daily with breakfast. For stroke prevention   atorvastatin (LIPITOR) 80 MG tablet Take 1 tablet (80 mg total) by mouth every evening. For stroke  prevention   clopidogrel (PLAVIX) 75 MG tablet Take 1 tablet (75 mg total) by mouth daily. For stroke prevention   Continuous Blood Gluc Receiver (DEXCOM G6 RECEIVER) DEVI Check sugar 4 times a day   Continuous Blood Gluc Sensor (DEXCOM G6 SENSOR) MISC 1 each by Does not apply route every 14 (fourteen) days.   Continuous Blood Gluc Transmit (DEXCOM G6 TRANSMITTER) MISC Check sugar 4 times a day   DULoxetine (CYMBALTA) 20 MG capsule Take 1 capsule (20 mg total) by mouth daily.   glucose blood (ONETOUCH ULTRA) test strip USE TO TEST BLOOD SUGAR 4 TIMES A DAY   insulin aspart (NOVOLOG) 100 UNIT/ML FlexPen Inject per sliding scale before mealsI: Blood sugars between 200-250 Take 2 units, 250-300 take 3 units, Over 300 Take 4 units (Patient taking differently: Inject 2-4 Units into the skin See admin instructions. Inject per sliding scale before mealsI: Blood sugars between 200-250 Take 2 units, 250-300 take 3 units, Over 300 Take 4 units)   insulin degludec (TRESIBA FLEXTOUCH) 100 UNIT/ML FlexTouch Pen Inject up to 20 units max once per day (Patient taking differently: Inject 18 Units into the skin daily.)   Insulin Pen Needle (PEN NEEDLES 3/16") 31G X 5 MM MISC Four times day   labetalol (NORMODYNE) 100 MG tablet Take 1 tablet (100 mg total) by mouth  2 (two) times daily. For BP   lisinopril (ZESTRIL) 40 MG tablet Take 1 tablet (40 mg total) by mouth daily. For blood pressure   promethazine (PHENERGAN) 12.5 MG tablet Take 1 tablet (12.5 mg total) by mouth every 6 (six) hours as needed for nausea.   Vitamin D, Ergocalciferol, (DRISDOL) 1.25 MG (50000 UNIT) CAPS capsule Take 1 capsule (50,000 Units total) by mouth once a week.   No current facility-administered medications for this visit. (Other)      REVIEW OF SYSTEMS:    ALLERGIES Allergies  Allergen Reactions   Fructose Other (See Comments)    Increase of blood sugar     PAST MEDICAL HISTORY Past Medical History:  Diagnosis Date    Diabetes mellitus    lantus/novolog   Hyperlipidemia    Hypertension    Marijuana abuse    occaisionally   Noncompliance with medication regimen    Stroke ([place]) 2020   Tobacco abuse    5/day   Past Surgical History:  Procedure Laterality Date   AV FISTULA PLACEMENT Left 01/08/2021   Procedure: LEFT ARM ARTERIOVENOUS (AV) FISTULA CREATION;  Surgeon: [name], MD;  Location: [facility];  Service: Vascular;  Laterality: Left;   EYE SURGERY      FAMILY HISTORY Family History  Problem Relation Age of Onset   Diabetes Father    Kidney failure Father        last 66 mnths of life   Hypertension Mother    Diabetes Maternal Grandmother    Pancreatic cancer Maternal Grandfather    Diabetes Paternal Grandfather     SOCIAL HISTORY Social History   Tobacco Use   Smoking status: Every Day    Packs/day: 0.50    Years: 8.00    Pack years: 4.00    Types: Cigarettes   Smokeless tobacco: Never   Tobacco comments:    smoke 5-8 per day  Vaping Use   Vaping Use: Never used  Substance Use Topics   Alcohol use: No   Drug use: Yes    Types: Marijuana    Comment: last use yesterday, every other days          OPHTHALMIC EXAM:  Base Eye Exam     Visual Acuity (ETDRS)       Right Left   Dist [place] 20/30 +1 20/25 -1+2         Tonometry (Tonopen, 2:10 PM)       Right Left   Pressure 17 19         Pupils       Dark Light Shape React APD   Right 3 3 Round Minimal None   Left 3 3 Round Minimal None         Extraocular Movement       Right Left    Full Full         Neuro/Psych     Oriented x3: Yes   Mood/Affect: Normal         Dilation     Both eyes: 1.0% Mydriacyl, 2.5% Phenylephrine @ 2:10 PM           Slit Lamp and Fundus Exam     External Exam       Right Left   External Normal Normal         Slit Lamp Exam       Right Left   Lids/Lashes Normal Normal   Conjunctiva/Sclera White and quiet White and quiet   Cornea  Clear Clear    Anterior Chamber Deep and quiet Deep and quiet   Iris Round and reactive Round and reactive   Lens Posterior chamber intraocular lens Posterior chamber intraocular lens   Anterior Vitreous Normal Normal         Fundus Exam       Right Left   Posterior Vitreous Normal Clear vitrectomized   Disc Normal Normal   C/D Ratio 0.45 0.45   Macula no macular thickening, Microaneurysms no macular thickening, Microaneurysms   Vessels PDR-, PDR-active with NVE inferonasal PDR-quiet   Periphery Good PRP,  attached, room posteriorly for PRP Good PRP,  attached            IMAGING AND PROCEDURES  Imaging and Procedures for 06/18/21  Color Fundus Photography Optos - OU - Both Eyes       Right Eye Progression has worsened. Disc findings include normal observations. Macula : microaneurysms. Vessels : Neovascularization.   Left Eye Progression has been stable. Disc findings include normal observations. Macula : normal observations. Vessels : normal observations. Periphery : normal observations.   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 Stable treated proliferative diabetic retinopathy of left eye without macular edema  determined by examination associated with type 1 diabetes mellitus ([place])  [id number]       1.  OS totally quiescent disease.  Excellent PRP peripherally.  Excellent acuity 2.  OD with sparse PRP, but delivery was hampered by visualization upon last attempt.  There is room for more PRP but with inactive disease I recommend simple observation OD  3.  Ophthalmic Meds Ordered this visit:  No orders of the defined types were placed in this encounter.      Return in about 6 months (around 12/16/2021) for DILATE OU, COLOR FP, OCT.  There are no Patient Instructions on file for this visit.   Explained the diagnoses, plan, and follow up with the patient and they expressed understanding.  Patient expressed understanding of the importance of proper follow up care.   Clent Demark Meiah Zamudio M.D. Diseases & Surgery of the Retina and Vitreous [facility]ina [facility] Diabetic [facility] 06/18/21     Abbreviations: M myopia (nearsighted); A astigmatism; H hyperopia (farsighted); P presbyopia; Mrx spectacle prescription;  CTL contact lenses; OD right eye; OS left eye; OU both eyes  XT exotropia; ET esotropia; PEK punctate epithelial keratitis; PEE punctate epithelial erosions; DES dry eye syndrome; MGD meibomian gland dysfunction; ATs artificial tears; PFAT's preservative free artificial tears; [place] nuclear sclerotic cataract; PSC posterior subcapsular cataract; ERM epi-retinal membrane; PVD posterior vitreous detachment; RD retinal detachment; DM diabetes mellitus; DR diabetic retinopathy; NPDR non-proliferative diabetic retinopathy; PDR proliferative diabetic retinopathy; CSME clinically significant macular edema; DME diabetic macular  edema; dbh dot blot hemorrhages; CWS cotton wool spot; POAG primary open angle glaucoma; C/D cup-to-disc ratio; HVF humphrey visual field; GVF goldmann visual field; OCT optical coherence tomography; IOP intraocular pressure; BRVO Branch retinal vein occlusion; CRVO central retinal vein occlusion;  CRAO central retinal artery occlusion; BRAO branch retinal artery occlusion; RT retinal tear; SB scleral buckle; PPV pars plana vitrectomy; VH Vitreous hemorrhage; PRP panretinal laser photocoagulation; IVK intravitreal kenalog; VMT vitreomacular traction; MH Macular hole;  NVD neovascularization of the disc; NVE neovascularization elsewhere; AREDS age related eye disease study; ARMD age related macular degeneration; POAG primary open angle glaucoma; EBMD epithelial/anterior basement membrane dystrophy; ACIOL anterior chamber intraocular lens; IOL intraocular lens; PCIOL posterior chamber intraocular lens; Phaco/IOL phacoemulsification with intraocular lens placement; [place] photorefractive keratectomy; LASIK laser assisted in situ keratomileusis; HTN hypertension; DM diabetes mellitus; COPD chronic obstructive pulmonary disease

## 2021-07-06 ENCOUNTER — Encounter: Payer: Self-pay | Admitting: Endocrinology

## 2021-07-07 ENCOUNTER — Telehealth: Payer: Self-pay | Admitting: Endocrinology

## 2021-07-07 NOTE — Telephone Encounter (Signed)
Pts mother calling to request refills of Dexcom G6 transmitter, sensors and test strips to:  [id number] [place] [address] [facility] Mother's contact [phone]

## 2021-07-08 ENCOUNTER — Other Ambulatory Visit: Payer: Self-pay

## 2021-07-08 DIAGNOSIS — E1065 Type 1 diabetes mellitus with hyperglycemia: Secondary | ICD-10-CM

## 2021-07-08 MED ORDER — ONETOUCH ULTRA VI STRP: ORAL_STRIP | 3 refills | Status: DC

## 2021-07-08 MED ORDER — DEXCOM G6 SENSOR MISC: 1.0000 | 3 refills | Status: DC

## 2021-07-08 MED ORDER — DEXCOM G6 TRANSMITTER MISC: 3 refills | Status: DC

## 2021-07-20 ENCOUNTER — Other Ambulatory Visit: Payer: Self-pay

## 2021-07-20 ENCOUNTER — Ambulatory Visit (INDEPENDENT_AMBULATORY_CARE_PROVIDER_SITE_OTHER): Payer: Medicare HMO | Admitting: Endocrinology

## 2021-07-20 VITALS — BP 100/68 | HR 91 | Ht 71.0 in | Wt 129.2 lb

## 2021-07-20 DIAGNOSIS — E1065 Type 1 diabetes mellitus with hyperglycemia: Secondary | ICD-10-CM | POA: Diagnosis not present

## 2021-07-20 LAB — POCT GLYCOSYLATED HEMOGLOBIN (HGB A1C): Hemoglobin A1C: 10.7 % — AB (ref 4.0–5.6)

## 2021-07-20 MED ORDER — DEXCOM G6 TRANSMITTER MISC: 3 refills | Status: DC

## 2021-07-20 NOTE — Progress Notes (Signed)
Patient ID: Nathaniel [name], male   DOB: 11/23/1985, 35 y.o.   MRN: [id number]           Reason for Appointment : Follow-up for Type 1 Diabetes  History of Present Illness           Diagnosis: Type 1 diabetes mellitus, date of diagnosis:  ?  2004        Previous history:  He has had recurrent hospitalizations for ketoacidosis especially in 2017 and 2018, last episode was in 01/2018 Overall has had consistently poor control with A1c as high as 16 In the past he has required large doses of at least basal insulin, taking up to 200 units of Lantus daily at some point  Recent history:     INSULIN regimen is: Novolog 2-5 units before meals Tresiba 12-13 units daily  His A1c is higher at 10.7 previously 9.6 compared to 11.9   Current management, blood sugar patterns and problems identified:    Glucose monitoring: He was not been using his Dexcom for a couple of weeks as he ran out of his supplies and does not know where to order them from  Although he and his mother are pleased with the help of the Dexcom they are not utilizing this information to help adjust insulin appropriately  His mother is again afraid of giving him enough NovoLog at suppertime at likely is not giving him more than 2 to 3 units at a time  She thinks he is eating very small meals and sometimes may not eat for couple of days  However at least with the Dexcom it appears that his blood sugars after supper were fairly consistently over 200 average after 10 PM through 6 AM  His mother has not skipped his [name] as before With this his blood sugars are less fluctuating during the daytime when he is not eating or sleeping She has reduced the dose to 12 or 13 units, previously recommended 14 but lately appears to have had decreased requirement With his fingerstick testing he has had blood sugars as low as 44 waking up and 4 times below normal in the last week PCP asked him to drink Glucerna and may be getting higher  readings in the afternoon or early evening from this, highest reading 350 recently  Interpretation of the Dexcom sensor download between 10/4 and 07/06/2021  GMI 7.8   He has more variability compared to before with CV 39 compared to 27  However most of the variability is overnight and early morning  POSTPRANDIAL readings after his evening meals are very consistently high after 9 PM or so and averaging around 250 between 12 AM-2 AM at least  Overnight blood sugars are starting of high with average over 200 until about 6 AM and then in general averaging between 150-180 during the daytime  Hypoglycemia has been occasional mostly prior to 12 noon and around 10 AM Blood sugars are variable during the daytime Premeal blood sugars are averaging about 180 and his main meal in the evening Highest blood sugars at night may be in the 300+ range  Statistics:  CGM use % of time 100  2-week average/GV 189/39  Time in range 43      %  % Time Above 180 52  % Time above 250 20  % Time Below 70 5   Previously  CGM use % of time 100  2-week average/GV 238/27  Time in range       26%  %  Time Above 180 24  % Time above 250 46  % Time Below 70 4      Hypoglycemia:  As above Symptoms of hypoglycemia: Feeling tired, sweaty, slurred speech, weakness Treatment of hypoglycemia: Juice usually, sometimes glucose tablets, usually 2 at a time     Self-care: The diet that the patient has been following is: None  Mealtimes are: Very variable              Dietician consultation: Most recent: 06/2018     CDE consultation: Years ago  Diabetes labs:  Lab Results  Component Value Date   HGBA1C 10.7 (A) 07/20/2021   HGBA1C 9.6 (A) 03/19/2021   HGBA1C 11.9 (A) 11/11/2020   Lab Results  Component Value Date   LDLCALC 138 (H) 05/18/2021   CREATININE 3.70 (H) 01/08/2021    No results found for: MICRALBCREAT  Wt Readings from Last 3 Encounters:  07/20/21 129 lb 3.2 oz (58.6 kg)  05/18/21 133  lb (60.3 kg)  03/19/21 133 lb 12.8 oz (60.7 kg)     Allergies as of 07/20/2021       Reactions   Fructose Other (See Comments)   Increase of blood sugar        Medication List        Accurate as of July 20, 2021  4:27 PM. If you have any questions, ask your nurse or doctor.          acetaminophen 325 MG tablet Commonly known as: TYLENOL Take 1-2 tablets (325-650 mg total) by mouth every 4 (four) hours as needed for mild pain.   amLODipine 10 MG tablet Commonly known as: NORVASC Take 1 tablet (10 mg total) by mouth daily. For blood pressure   atorvastatin 80 MG tablet Commonly known as: LIPITOR Take 1 tablet (80 mg total) by mouth every evening. For stroke prevention   clopidogrel 75 MG tablet Commonly known as: PLAVIX Take 1 tablet (75 mg total) by mouth daily. For stroke prevention   Dexcom G6 Receiver Devi Check sugar 4 times a day   Dexcom G6 Sensor Misc 1 each by Does not apply route every 14 (fourteen) days.   Dexcom G6 Transmitter Misc Check sugar 4 times a day   DULoxetine 20 MG capsule Commonly known as: Cymbalta Take 1 capsule (20 mg total) by mouth daily.   insulin aspart 100 UNIT/ML FlexPen Commonly known as: NOVOLOG Inject per sliding scale before mealsI: Blood sugars between 200-250 Take 2 units, 250-300 take 3 units, Over 300 Take 4 units What changed:  how much to take how to take this when to take this   labetalol 100 MG tablet Commonly known as: NORMODYNE Take 1 tablet (100 mg total) by mouth 2 (two) times daily. For BP   lisinopril 40 MG tablet Commonly known as: ZESTRIL Take 1 tablet (40 mg total) by mouth daily. For blood pressure   OneTouch Ultra test strip Generic drug: glucose blood USE TO TEST BLOOD SUGAR 4 TIMES A DAY   Pen Needles 3/16" 31G X 5 MM Misc Four times day   promethazine 12.5 MG tablet Commonly known as: PHENERGAN Take 1 tablet (12.5 mg total) by mouth every 6 (six) hours as needed for nausea.    [name] FlexTouch 100 UNIT/ML FlexTouch Pen Generic drug: insulin degludec Inject up to 20 units max once per day What changed:  how much to take how to take this when to take this additional instructions   Vitamin D (Ergocalciferol) 1.25 MG (  50000 UNIT) Caps capsule Commonly known as: DRISDOL Take 1 capsule (50,000 Units total) by mouth once a week.        Allergies:  Allergies  Allergen Reactions   Fructose Other (See Comments)    Increase of blood sugar     Past Medical History:  Diagnosis Date   Diabetes mellitus    lantus/novolog   Hyperlipidemia    Hypertension    Marijuana abuse    occaisionally   Noncompliance with medication regimen    Stroke ([place]) 2020   Tobacco abuse    5/day    Past Surgical History:  Procedure Laterality Date   AV FISTULA PLACEMENT Left 01/08/2021   Procedure: LEFT ARM ARTERIOVENOUS (AV) FISTULA CREATION;  Surgeon: [name], MD;  Location: [facility];  Service: Vascular;  Laterality: Left;   EYE SURGERY      Family History  Problem Relation Age of Onset   Diabetes Father    Kidney failure Father        last 37 mnths of life   Hypertension Mother    Diabetes Maternal Grandmother    Pancreatic cancer Maternal Grandfather    Diabetes Paternal Grandfather     Social History:  reports that he has been smoking cigarettes. He has a 4.00 pack-year smoking history. He has never used smokeless tobacco. He reports current drug use. Drug: Marijuana. He reports that he does not drink alcohol.      Review of Systems      Lipids: After his CVA in June 2019 he was started on Lipitor 80 mg However he appears to be taking this irregularly   Lab Results  Component Value Date   CHOL 204 (H) 05/18/2021   HDL 37.60 (L) 05/18/2021   LDLCALC 138 (H) 05/18/2021   TRIG 142.0 05/18/2021   CHOLHDL 5 05/18/2021   HYPERTENSION: Has been on treatment for several years from nephrologist  Blood pressure medications including lisinopril 40 mg,  and amlodipine    BP Readings from Last 3 Encounters:  07/20/21 100/68  05/18/21 108/64  03/19/21 122/88    Has chronic kidney disease, seen by nephrologist regularly and has not been advised dialysis as yet higher at 10  Lab Results  Component Value Date   CREATININE 3.70 (H) 01/08/2021   CREATININE 3.68 (H) 07/16/2020   CREATININE 4.52 (H) 07/15/2020   He has had a thyroid nodule on the right side, ultrasound has been ordered a couple of times but he has not gone for this  Lab Results  Component Value Date   TSH 0.762 07/14/2020      Physical Examination:  BP 100/68 (BP Location: Left Arm, Patient Position: Sitting, Cuff Size: Normal)   Pulse 91   Ht 5\' 11"  (1.803 m)   Wt 129 lb 3.2 oz (58.6 kg)   SpO2 100%   BMI 18.02 kg/m     ASSESSMENT:  Diabetes type 1, persistently poorly controlled  A1c is higher at 10.7  Recently has not used the Dexcom but previously appeared to be having mostly high readings after his main meal in the evening More recently appears to be having overnight low sugars and explained to him and his mother that the low sugars recently are from excessive [name] and likely may be requiring less insulin with his decreased intake or renal dysfunction However most likely needs more insulin to cover his evening meals and the previously used 2 to 3 units coverage is inadequate Also does not have correction factor  He does not count carbohydrates May not be a candidate for the [place] at this time  Today discussed adjustment of the [name] as well as NovoLog and control of postprandial readings in detail  HISTORY of hypercholesterolemia: We will need follow-up labs on the next visit  PLAN:    Take 2-3 U NovoLog for Glucerna, add 1 more unit if having coffee at the same time  Take 10 Tresiba instead of 12-13 and reduce 1-2 units more if sugar in am under 9  Take Novolog as soon as done eating main meal, upto 10 units based on meal size   Extra  1 unit Novolog for every 100 mg over 100 for the high readings Detailed instructions were reviewed with his mother Follow-up in 2 months  Prescription for transmitter will be sent to [facility] but not clear where his mail-order supplies are coming from, his mother needs to check on this  Patient Instructions  Take 2-3 U for Glucerna  Take 10 Tresiba and reduce 1-2 units more if sugar in am under 9  Take Novolog as soon as done eating, upto 10 units based on meal size   Extra 1 unit Novolog for every 100 mg over 100   Total visit time for management of diabetes and counseling regarding insulin regimen and avoidance of hyperglycemia and hypoglycemia = 30 minutes   Elayne Snare 07/20/2021, 4:27 PM      Note: This note was prepared with Dragon voice recognition system technology. Any transcriptional errors that result from this process are unintentional.

## 2021-07-20 NOTE — Patient Instructions (Signed)
Take 2-3 U for Glucerna  Take 10 Tresiba and reduce 1-2 units more if sugar in am under 9  Take Novolog as soon as done eating, upto 10 units based on meal size   Extra 1 unit Novolog for every 100 mg over 100

## 2021-07-21 ENCOUNTER — Other Ambulatory Visit: Payer: Self-pay

## 2021-07-21 DIAGNOSIS — E1065 Type 1 diabetes mellitus with hyperglycemia: Secondary | ICD-10-CM

## 2021-07-21 MED ORDER — DEXCOM G6 SENSOR MISC: 1.0000 | 3 refills | Status: DC

## 2021-07-21 MED ORDER — DEXCOM G6 TRANSMITTER MISC: 3 refills | Status: AC

## 2021-10-05 ENCOUNTER — Ambulatory Visit: Payer: Medicare HMO | Admitting: Endocrinology

## 2021-11-18 ENCOUNTER — Other Ambulatory Visit: Payer: Self-pay | Admitting: Endocrinology

## 2021-11-18 DIAGNOSIS — E1065 Type 1 diabetes mellitus with hyperglycemia: Secondary | ICD-10-CM

## 2021-11-23 ENCOUNTER — Ambulatory Visit (INDEPENDENT_AMBULATORY_CARE_PROVIDER_SITE_OTHER): Payer: Medicare HMO | Admitting: Endocrinology

## 2021-11-23 ENCOUNTER — Other Ambulatory Visit: Payer: Self-pay

## 2021-11-23 VITALS — BP 90/62 | HR 87 | Ht 71.0 in | Wt 136.0 lb

## 2021-11-23 DIAGNOSIS — E1065 Type 1 diabetes mellitus with hyperglycemia: Secondary | ICD-10-CM

## 2021-11-23 DIAGNOSIS — E7800 Pure hypercholesterolemia, unspecified: Secondary | ICD-10-CM | POA: Diagnosis not present

## 2021-11-23 LAB — POCT GLYCOSYLATED HEMOGLOBIN (HGB A1C): Hemoglobin A1C: 11.2 % — AB (ref 4.0–5.6)

## 2021-11-23 NOTE — Progress Notes (Signed)
Patient ID: Nathaniel [name], male   DOB: 16-Aug-1986, 36 y.o.   MRN: [id number]           Reason for Appointment : Follow-up for Type 1 Diabetes  History of Present Illness           Diagnosis: Type 1 diabetes mellitus, date of diagnosis:  ?  2004        Previous history:  He has had recurrent hospitalizations for ketoacidosis especially in 2017 and 2018, last episode was in 01/2018 Overall has had consistently poor control with A1c as high as 16 In the past he has required large doses of at least basal insulin, taking up to 200 units of Lantus daily at some point  Recent history:    Take 2-3 U NovoLog for Glucerna, add 1 more unit if having coffee at the same time  Take 10 Tresiba instead of 12-13 and reduce 1-2 units more if sugar in am under 9  Take Novolog as soon as done eating main meal, upto 10 units based on meal size   Extra 1 unit Novolog for every 100 mg over 100 for the high readings  INSULIN regimen is: Novolog usually 5 units at meals  Tresiba 12 or 15 units daily  His A1c is progressively higher at 11.2   Current management, blood sugar patterns and problems identified:    Glucose monitoring: He apparently depends on his mother to do his insulin injections and she has not changed the regimen as discussed at last visit  He was told to take up to 10 units of NovoLog at dinnertime when he would eat a full meal but is only taking 5 units at dinnertime.  Also was told to take the NovoLog right after finishing eating to better assess how much he needs but is generally getting it before eating  He now also says that he is drinking juices during the night and will eat snacks like potato chips through the night and not take any insulin coverage for this  As before his blood sugars are highly variable  However since his last visit his appetite has improved significantly and he is generally eating larger portions and snacks causing his blood sugars to be higher  His  mother gives him 12 units of Tresiba mostly but if the blood sugars are over 300 at dinnertime he will get 15 units Hypoglycemia has been infrequent but still lowest blood sugars are overnight He is again not taking any coverage for drinking Glucerna for the  Interpretation of the Dexcom sensor download for the last 2 weeks:  GMI 8.8  HIGHEST blood sugars overall are between 9 PM and 6 AM, lowest blood sugars around 2-6 PM  Significant variability is present at all times similar to previous visit  Generally blood sugars are at the upper end of the target range during the morning and early afternoon hours and progressively rise after about 6 PM until around midnight and mostly averaging around 270 until early morning with some variability HYPOGLYCEMIA has been minimal and only occasionally around 4-6 AM POSTPRANDIAL readings appear to be rising fairly consistently in the evenings after dinner especially in the second week and only occasionally be within the target range or low normal   Statistics:  CGM use % of time 100  2-week average/GV 230/40  Time in range 35, previously 43  % Time Above 180 21+41  % Time above 250   % Time Below 70 2  Previously:  CGM use % of time 100  2-week average/GV 189/39  Time in range 43      %  % Time Above 180 52  % Time above 250 20  % Time Below 70 5     Hypoglycemia:  As above Symptoms of hypoglycemia: Feeling tired, sweaty, slurred speech, weakness Treatment of hypoglycemia: Juice usually, sometimes glucose tablets, usually 2 at a time     Self-care: The diet that the patient has been following is: None  Mealtimes are: Very variable              Dietician consultation: Most recent: 06/2018     CDE consultation: Years ago  Diabetes labs:  Lab Results  Component Value Date   HGBA1C 11.2 (A) 11/23/2021   HGBA1C 10.7 (A) 07/20/2021   HGBA1C 9.6 (A) 03/19/2021   Lab Results  Component Value Date   LDLCALC 138 (H) 05/18/2021    CREATININE 3.70 (H) 01/08/2021    No results found for: MICRALBCREAT  Wt Readings from Last 3 Encounters:  11/23/21 136 lb (61.7 kg)  07/20/21 129 lb 3.2 oz (58.6 kg)  05/18/21 133 lb (60.3 kg)     Allergies as of 11/23/2021       Reactions   Fructose Other (See Comments)   Increase of blood sugar        Medication List        Accurate as of November 23, 2021 11:59 PM. If you have any questions, ask your nurse or doctor.          acetaminophen 325 MG tablet Commonly known as: TYLENOL Take 1-2 tablets (325-650 mg total) by mouth every 4 (four) hours as needed for mild pain.   amLODipine 10 MG tablet Commonly known as: NORVASC Take 1 tablet (10 mg total) by mouth daily. For blood pressure   atorvastatin 80 MG tablet Commonly known as: LIPITOR Take 1 tablet (80 mg total) by mouth every evening. For stroke prevention   clopidogrel 75 MG tablet Commonly known as: PLAVIX Take 1 tablet (75 mg total) by mouth daily. For stroke prevention   Dexcom G6 Receiver Devi Check sugar 4 times a day   Dexcom G6 Sensor Misc 1 each by Does not apply route every 14 (fourteen) days.   Dexcom G6 Transmitter Misc Check sugar 4 times a day   DULoxetine 20 MG capsule Commonly known as: Cymbalta Take 1 capsule (20 mg total) by mouth daily.   insulin aspart 100 UNIT/ML FlexPen Commonly known as: NOVOLOG Inject per sliding scale before mealsI: Blood sugars between 200-250 Take 2 units, 250-300 take 3 units, Over 300 Take 4 units What changed:  how much to take how to take this when to take this   labetalol 100 MG tablet Commonly known as: NORMODYNE Take 1 tablet (100 mg total) by mouth 2 (two) times daily. For BP   lisinopril 40 MG tablet Commonly known as: ZESTRIL Take 1 tablet (40 mg total) by mouth daily. For blood pressure   OneTouch Ultra test strip Generic drug: glucose blood USE TO TEST BLOOD SUGAR 4 TIMES A DAY   Pen Needles 3/16" 31G X 5 MM Misc Four times day    promethazine 12.5 MG tablet Commonly known as: PHENERGAN Take 1 tablet (12.5 mg total) by mouth every 6 (six) hours as needed for nausea.   [name] FlexTouch 100 UNIT/ML FlexTouch Pen Generic drug: insulin degludec Inject up to 20 units max once per day What changed:  how much to take how to take this when to take this additional instructions   Vitamin D (Ergocalciferol) 1.25 MG (50000 UNIT) Caps capsule Commonly known as: DRISDOL Take 1 capsule (50,000 Units total) by mouth once a week.        Allergies:  Allergies  Allergen Reactions   Fructose Other (See Comments)    Increase of blood sugar     Past Medical History:  Diagnosis Date   Diabetes mellitus    lantus/novolog   Hyperlipidemia    Hypertension    Marijuana abuse    occaisionally   Noncompliance with medication regimen    Stroke ([place]) 2020   Tobacco abuse    5/day    Past Surgical History:  Procedure Laterality Date   AV FISTULA PLACEMENT Left 01/08/2021   Procedure: LEFT ARM ARTERIOVENOUS (AV) FISTULA CREATION;  Surgeon: [name], MD;  Location: [facility];  Service: Vascular;  Laterality: Left;   EYE SURGERY      Family History  Problem Relation Age of Onset   Diabetes Father    Kidney failure Father        last 68 mnths of life   Hypertension Mother    Diabetes Maternal Grandmother    Pancreatic cancer Maternal Grandfather    Diabetes Paternal Grandfather     Social History:  reports that he has been smoking cigarettes. He has a 4.00 pack-year smoking history. He has never used smokeless tobacco. He reports current drug use. Drug: Marijuana. He reports that he does not drink alcohol.      Review of Systems      Lipids: After his CVA in June 2019 he was started on Lipitor 80 mg Again he appears to be taking this irregularly and not clear why he is not able to take it consistently   Lab Results  Component Value Date   CHOL 204 (H) 05/18/2021   HDL 37.60 (L) 05/18/2021   LDLCALC  138 (H) 05/18/2021   TRIG 142.0 05/18/2021   CHOLHDL 5 05/18/2021   HYPERTENSION: Has been on treatment for several years from nephrologist  Blood pressure medications including lisinopril 40 mg, and amlodipine    BP Readings from Last 3 Encounters:  11/23/21 90/62  07/20/21 100/68  05/18/21 108/64    Has chronic kidney disease, seen by nephrologist regularly and has not been advised dialysis as yet   Lab Results  Component Value Date   CREATININE 3.70 (H) 01/08/2021   CREATININE 3.68 (H) 07/16/2020   CREATININE 4.52 (H) 07/15/2020   He has had a thyroid nodule on the right side, ultrasound has been ordered a couple of times but he has not gone for this  Lab Results  Component Value Date   TSH 0.762 07/14/2020      Physical Examination:  BP 90/62    Pulse 87    Ht 5\' 11"  (1.803 m)    Wt 136 lb (61.7 kg)    SpO2 96%    BMI 18.97 kg/m     ASSESSMENT:  Diabetes type 1, persistently poorly controlled  A1c is higher at 11.2  His blood sugars are still poorly controlled with hyperglycemia related to meals and snacks in the evenings and overnight as before  His basal insulin appears to be mostly adequate since starting morning blood sugars are frequently around 140-150 but variability is caused because of inadequate mealtime coverage, snacks and high glycemic index drinks  Unable to inject his insulin himself for some reason and  his mother is not available to help him cover his snacks during the night  Mealtime insulin has been mostly inadequate and especially since his appetite has increased lately He does need further guidance with meal planning and insulin adjustment for various types of meals Not clear if he can do carbohydrate counting   HISTORY of hypercholesterolemia: Emphasized the need to take the atorvastatin every day at the same time at dinnertime Also will need follow-up labs on the next visit  PLAN:  No change in [name] as yet unless morning sugars  are consistently higher To adjust the dose only every 4 to 5 days if blood sugars are consistently higher or lower Again discussed in detail the use of mealtime insulin with to cover all meals and snacks that have carbohydrate He can take his mealtime dose as soon as he finishes eating as before and likely needs 10 units when eating a full meal and reduce doses if eating less He will eliminate juices and any regular soft drinks Avoid high carbohydrate and high fat snacks  Patient Instructions  Take Novolog as soon as done eating main meal, at least 5 units  upto 10 units based on meal size  Extra 1 unit Novolog for every 100 mg over 100 for the high readings 3-5 units Novolog for nite snacks No juices or sweet drinks  Take Lipitor at supper daily   Total visit time for management of diabetes and counseling regarding insulin regimen and management of hyperglycemia and hypoglycemia = 30 minutes   Elayne Snare 11/24/2021, 8:33 AM      Note: This note was prepared with Dragon voice recognition system technology. Any transcriptional errors that result from this process are unintentional.

## 2021-11-23 NOTE — Patient Instructions (Addendum)
Take Novolog as soon as done eating main meal, at least 5 units  upto 10 units based on meal size  ?Extra 1 unit Novolog for every 100 mg over 100 for the high readings ?3-5 units Novolog for nite snacks ?No juices or sweet drinks ? ?Take Lipitor at supper daily ?

## 2021-12-03 ENCOUNTER — Encounter: Payer: Self-pay | Admitting: Endocrinology

## 2021-12-07 ENCOUNTER — Other Ambulatory Visit: Payer: Self-pay

## 2021-12-07 ENCOUNTER — Other Ambulatory Visit: Payer: Self-pay | Admitting: Endocrinology

## 2021-12-07 DIAGNOSIS — E1065 Type 1 diabetes mellitus with hyperglycemia: Secondary | ICD-10-CM

## 2021-12-17 ENCOUNTER — Encounter (INDEPENDENT_AMBULATORY_CARE_PROVIDER_SITE_OTHER): Payer: Medicare HMO | Admitting: Ophthalmology

## 2021-12-21 ENCOUNTER — Encounter (INDEPENDENT_AMBULATORY_CARE_PROVIDER_SITE_OTHER): Payer: Self-pay | Admitting: Ophthalmology

## 2021-12-21 ENCOUNTER — Encounter (INDEPENDENT_AMBULATORY_CARE_PROVIDER_SITE_OTHER): Payer: Medicare HMO | Admitting: Ophthalmology

## 2021-12-21 ENCOUNTER — Ambulatory Visit (INDEPENDENT_AMBULATORY_CARE_PROVIDER_SITE_OTHER): Payer: Medicare HMO | Admitting: Ophthalmology

## 2021-12-21 DIAGNOSIS — E103591 Type 1 diabetes mellitus with proliferative diabetic retinopathy without macular edema, right eye: Secondary | ICD-10-CM | POA: Diagnosis not present

## 2021-12-21 DIAGNOSIS — E103552 Type 1 diabetes mellitus with stable proliferative diabetic retinopathy, left eye: Secondary | ICD-10-CM

## 2021-12-21 DIAGNOSIS — H401121 Primary open-angle glaucoma, left eye, mild stage: Secondary | ICD-10-CM | POA: Diagnosis not present

## 2021-12-21 NOTE — Assessment & Plan Note (Signed)
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 ? ? ?

## 2021-12-21 NOTE — Assessment & Plan Note (Signed)
No active maculopathy in quiescent PDR ?

## 2021-12-21 NOTE — Assessment & Plan Note (Signed)
No active maculopathy with early PDR and incomplete PRP. ? ?We will need to go ahead and complete PRP we will schedule this in the coming weeks ?

## 2021-12-21 NOTE — Progress Notes (Signed)
? ? ?12/21/2021 ? ?  ? ?CHIEF COMPLAINT ?Patient presents for  ?Chief Complaint  ?Patient presents with  ? Retina Follow Up  ? ? ? ? ?HISTORY OF PRESENT ILLNESS: ?Nathaniel [name] is a 36 y.o. male who presents to the clinic today for:  ? ?HPI   ? ? Retina Follow Up   ? ?      ? Diagnosis: Other  ? Laterality: right eye  ? Severity: moderate  ? Course: stable  ? ?  ?  ? ? Comments   ?6 mos fu ou oct fp. ?Pt states no changes in vision. ?Pt denies floaters and FOL. ? ? ? ?  ?  ?Last edited by Silvestre Moment on 12/21/2021  1:52 PM.  ?  ? ? ?Referring physician: ?[name], MD ?[address] ?[place],  [place] [id number] ? ?HISTORICAL INFORMATION:  ? ?Selected notes from the [facility] ?  ? ?Lab Results  ?Component Value Date  ? HGBA1C 11.2 (A) 11/23/2021  ?  ? ?CURRENT MEDICATIONS: ?No current outpatient medications on file. (Ophthalmic Drugs)  ? ?No current facility-administered medications for this visit. (Ophthalmic Drugs)  ? ?Current Outpatient Medications (Other)  ?Medication Sig  ? acetaminophen (TYLENOL) 325 MG tablet Take 1-2 tablets (325-650 mg total) by mouth every 4 (four) hours as needed for mild pain.  ? amLODipine (NORVASC) 10 MG tablet Take 1 tablet (10 mg total) by mouth daily. For blood pressure  ? atorvastatin (LIPITOR) 80 MG tablet Take 1 tablet (80 mg total) by mouth every evening. For stroke prevention  ? clopidogrel (PLAVIX) 75 MG tablet Take 1 tablet (75 mg total) by mouth daily. For stroke prevention  ? Continuous Blood Gluc Receiver (DEXCOM G6 RECEIVER) DEVI Check sugar 4 times a day  ? Continuous Blood Gluc Sensor (DEXCOM G6 SENSOR) MISC 1 each by Does not apply route every 14 (fourteen) days.  ? Continuous Blood Gluc Transmit (DEXCOM G6 TRANSMITTER) MISC Check sugar 4 times a day  ? DULoxetine (CYMBALTA) 20 MG capsule Take 1 capsule (20 mg total) by mouth daily.  ? insulin aspart (NOVOLOG) 100 UNIT/ML FlexPen Inject per sliding scale before mealsI: Blood sugars between 200-250 Take 2  units, 250-300 take 3 units, Over 300 Take 4 units (Patient taking differently: Inject 2-4 Units into the skin See admin instructions. Inject per sliding scale before mealsI: Blood sugars between 200-250 Take 2 units, 250-300 take 3 units, Over 300 Take 4 units)  ? insulin degludec (TRESIBA FLEXTOUCH) 100 UNIT/ML FlexTouch Pen INJECT UP TO 10 UNITS ONCE DAILY AS DIRECTED - PLUS UP TO 5 UNITS IF BLOOD SUGAR IS ABOVE 200 - PER SLIDING SCALE PROVIDED BY PHYSICIAN  ? Insulin Pen Needle (PEN NEEDLES 3/16") 31G X 5 MM MISC Four times day  ? labetalol (NORMODYNE) 100 MG tablet Take 1 tablet (100 mg total) by mouth 2 (two) times daily. For BP  ? lisinopril (ZESTRIL) 40 MG tablet Take 1 tablet (40 mg total) by mouth daily. For blood pressure  ? ONETOUCH ULTRA test strip USE TO TEST BLOOD SUGAR 4 TIMES A DAY  ? promethazine (PHENERGAN) 12.5 MG tablet Take 1 tablet (12.5 mg total) by mouth every 6 (six) hours as needed for nausea.  ? Vitamin D, Ergocalciferol, (DRISDOL) 1.25 MG (50000 UNIT) CAPS capsule Take 1 capsule (50,000 Units total) by mouth once a week.  ? ?No current facility-administered medications for this visit. (Other)  ? ? ? ? ?REVIEW OF SYSTEMS: ?ROS   ?Negative  for: Constitutional, Gastrointestinal, Neurological, Skin, Genitourinary, Musculoskeletal, HENT, Endocrine, Cardiovascular, Eyes, Respiratory, Psychiatric, Allergic/Imm, Heme/Lymph ?Last edited by Silvestre Moment on 12/21/2021  1:52 PM.  ?  ? ? ? ?ALLERGIES ?Allergies  ?Allergen Reactions  ? Fructose Other (See Comments)  ?  Increase of blood sugar ?  ? ? ?PAST MEDICAL HISTORY ?Past Medical History:  ?Diagnosis Date  ? Diabetes mellitus   ? lantus/novolog  ? Hyperlipidemia   ? Hypertension   ? Marijuana abuse   ? occaisionally  ? Noncompliance with medication regimen   ? Stroke [facility]) 2020  ? Tobacco abuse   ? 5/day  ? ?Past Surgical History:  ?Procedure Laterality Date  ? AV FISTULA PLACEMENT Left 01/08/2021  ? Procedure: LEFT ARM ARTERIOVENOUS (AV) FISTULA  CREATION;  Surgeon: [name], MD;  Location: [facility];  Service: Vascular;  Laterality: Left;  ? EYE SURGERY    ? ? ?FAMILY HISTORY ?Family History  ?Problem Relation Age of Onset  ? Diabetes Father   ? Kidney failure Father   ?     last 4 mnths of life  ? Hypertension Mother   ? Diabetes Maternal Grandmother   ? Pancreatic cancer Maternal Grandfather   ? Diabetes Paternal Grandfather   ? ? ?SOCIAL HISTORY ?Social History  ? ?Tobacco Use  ? Smoking status: Every Day  ?  Packs/day: 0.50  ?  Years: 8.00  ?  Pack years: 4.00  ?  Types: Cigarettes  ? Smokeless tobacco: Never  ? Tobacco comments:  ?  smoke 5-8 per day  ?Vaping Use  ? Vaping Use: Never used  ?Substance Use Topics  ? Alcohol use: No  ? Drug use: Yes  ?  Types: Marijuana  ?  Comment: last use yesterday, every other days   ? ?  ? ?  ? ?OPHTHALMIC EXAM: ? ?Base Eye Exam   ? ? Visual Acuity (ETDRS)   ? ?   Right Left  ? Dist [place] 20/30 +2 20/30 -2 +2  ? ?  ?  ? ? Tonometry (Tonopen, 1:59 PM)   ? ?   Right Left  ? Pressure 12 24  ? ?  ?  ? ? Pupils   ? ?   Dark Light Shape React APD  ? Right 3 2 Round Slow None  ? Left 3 2 Round Slow None  ? ?  ?  ? ? Visual Fields   ? ?   Left Right  ?  Full Full  ? ?  ?  ? ? Extraocular Movement   ? ?   Right Left  ?  Full Full  ? ?  ?  ? ? Neuro/Psych   ? ? Oriented x3: Yes  ? Mood/Affect: Normal  ? ?  ?  ? ? Dilation   ? ? Both eyes: 1.0% Mydriacyl, 2.5% Phenylephrine @ 1:59 PM  ? ?  ?  ? ?  ? ?Slit Lamp and Fundus Exam   ? ? External Exam   ? ?   Right Left  ? External Normal Normal  ? ?  ?  ? ? Slit Lamp Exam   ? ?   Right Left  ? Lids/Lashes Normal Normal  ? Conjunctiva/Sclera White and quiet White and quiet  ? Cornea Clear Clear  ? Anterior Chamber Deep and quiet Deep and quiet  ? Iris Round and reactive Round and reactive  ? Lens Posterior chamber intraocular lens Posterior chamber intraocular lens  ? Anterior Vitreous Normal  Normal  ? ?  ?  ? ? Fundus Exam   ? ?   Right Left  ? Posterior Vitreous Normal Clear  vitrectomized  ? Disc Normal Normal  ? C/D Ratio 0.45 0.45  ? Macula no macular thickening, Microaneurysms no macular thickening, Microaneurysms  ? Vessels PDR-, no active PDR yet incomplete PRP PDR-quiet  ? Periphery Good PRP, attached, room posteriorly for PRP, and superiorly room for PRP Good PRP, 360, attached  ? ?  ?  ? ?  ? ? ?IMAGING AND PROCEDURES  ?Imaging and Procedures for 12/21/21 ? ?OCT, Retina - OU - Both Eyes   ? ?   ?Right Eye ?Quality was good. Scan locations included subfoveal. Central Foveal Thickness: 231. Progression has been stable. Findings include normal observations, abnormal foveal contour.  ? ?Left Eye ?Quality was good. Scan locations included subfoveal. Central Foveal Thickness: 236. Progression has been stable. Findings include normal observations, abnormal foveal contour.  ? ?Notes ?No Active clinically significant macular edema in either eye.  Quiescent PDR OU ? ?  ? ?Color Fundus Photography Optos - OU - Both Eyes   ? ?   ?Right Eye ?Progression has worsened. Disc findings include normal observations. Macula : microaneurysms. Vessels : Neovascularization.  ? ?Left Eye ?Progression has been stable. Disc findings include normal observations. Macula : normal observations. Vessels : normal observations. Periphery : normal observations.  ? ?Notes ?Good PRP temporally and inferiorly yet there is room nasally and superiorly for more PRP in the right eye ? ?Left eye completely quiescent proliferative diabetic retinopathy ? ?  ? ? ?  ?  ? ?  ?ASSESSMENT/PLAN: ? 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 ? ? ? 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 ? ?

## 2022-01-04 ENCOUNTER — Encounter (INDEPENDENT_AMBULATORY_CARE_PROVIDER_SITE_OTHER): Payer: Medicare HMO | Admitting: Ophthalmology

## 2022-01-05 ENCOUNTER — Encounter (INDEPENDENT_AMBULATORY_CARE_PROVIDER_SITE_OTHER): Payer: Self-pay

## 2022-01-06 LAB — HM DIABETES EYE EXAM

## 2022-01-07 ENCOUNTER — Ambulatory Visit (INDEPENDENT_AMBULATORY_CARE_PROVIDER_SITE_OTHER): Payer: Medicare HMO | Admitting: Ophthalmology

## 2022-01-07 ENCOUNTER — Encounter (INDEPENDENT_AMBULATORY_CARE_PROVIDER_SITE_OTHER): Payer: Self-pay | Admitting: Ophthalmology

## 2022-01-07 DIAGNOSIS — E103591 Type 1 diabetes mellitus with proliferative diabetic retinopathy without macular edema, right eye: Secondary | ICD-10-CM

## 2022-01-07 DIAGNOSIS — E103552 Type 1 diabetes mellitus with stable proliferative diabetic retinopathy, left eye: Secondary | ICD-10-CM

## 2022-01-07 NOTE — Assessment & Plan Note (Signed)
Completion of PRP today temporally and superiorly, will continue to monitor and observe ?

## 2022-01-07 NOTE — Progress Notes (Signed)
? ? ?01/07/2022 ? ?  ? ?CHIEF COMPLAINT ?Patient presents for  ?Chief Complaint  ?Patient presents with  ? Retina Follow Up  ? ? ? ? ?HISTORY OF PRESENT ILLNESS: ?Nathaniel [name] is a 36 y.o. male who presents to the clinic today for:  ? ?HPI   ? ? Retina Follow Up   ? ?      ? Diagnosis: Other  ? Laterality: right eye  ? Severity: moderate  ? Course: stable  ? ?  ?  ? ? Comments   ?2 weeks for DILATE, OD, PRP. ?Pt denies FOL and floaters. ?Pt states vision has been stable. ? ? ? ?  ?  ?Last edited by Silvestre Moment on 01/07/2022  3:16 PM.  ?  ? ? ?Referring physician: ?Carrolyn Meiers, MD ?[address] ?[place],  [place] [address] ? ?HISTORICAL INFORMATION:  ? ?Selected notes from the [facility] ?  ? ?Lab Results  ?Component Value Date  ? HGBA1C 11.2 (A) 11/23/2021  ?  ? ?CURRENT MEDICATIONS: ?No current outpatient medications on file. (Ophthalmic Drugs)  ? ?No current facility-administered medications for this visit. (Ophthalmic Drugs)  ? ?Current Outpatient Medications (Other)  ?Medication Sig  ? acetaminophen (TYLENOL) 325 MG tablet Take 1-2 tablets (325-650 mg total) by mouth every 4 (four) hours as needed for mild pain.  ? amLODipine (NORVASC) 10 MG tablet Take 1 tablet (10 mg total) by mouth daily. For blood pressure  ? atorvastatin (LIPITOR) 80 MG tablet Take 1 tablet (80 mg total) by mouth every evening. For stroke prevention  ? clopidogrel (PLAVIX) 75 MG tablet Take 1 tablet (75 mg total) by mouth daily. For stroke prevention  ? Continuous Blood Gluc Receiver (DEXCOM G6 RECEIVER) DEVI Check sugar 4 times a day  ? Continuous Blood Gluc Sensor (DEXCOM G6 SENSOR) MISC 1 each by Does not apply route every 14 (fourteen) days.  ? Continuous Blood Gluc Transmit (DEXCOM G6 TRANSMITTER) MISC Check sugar 4 times a day  ? DULoxetine (CYMBALTA) 20 MG capsule Take 1 capsule (20 mg total) by mouth daily.  ? insulin aspart (NOVOLOG) 100 UNIT/ML FlexPen Inject per sliding scale before mealsI: Blood sugars  between 200-250 Take 2 units, 250-300 take 3 units, Over 300 Take 4 units (Patient taking differently: Inject 2-4 Units into the skin See admin instructions. Inject per sliding scale before mealsI: Blood sugars between 200-250 Take 2 units, 250-300 take 3 units, Over 300 Take 4 units)  ? insulin degludec (TRESIBA FLEXTOUCH) 100 UNIT/ML FlexTouch Pen INJECT UP TO 10 UNITS ONCE DAILY AS DIRECTED - PLUS UP TO 5 UNITS IF BLOOD SUGAR IS ABOVE 200 - PER SLIDING SCALE PROVIDED BY PHYSICIAN  ? Insulin Pen Needle (PEN NEEDLES 3/16") 31G X 5 MM MISC Four times day  ? labetalol (NORMODYNE) 100 MG tablet Take 1 tablet (100 mg total) by mouth 2 (two) times daily. For BP  ? lisinopril (ZESTRIL) 40 MG tablet Take 1 tablet (40 mg total) by mouth daily. For blood pressure  ? ONETOUCH ULTRA test strip USE TO TEST BLOOD SUGAR 4 TIMES A DAY  ? promethazine (PHENERGAN) 12.5 MG tablet Take 1 tablet (12.5 mg total) by mouth every 6 (six) hours as needed for nausea.  ? Vitamin D, Ergocalciferol, (DRISDOL) 1.25 MG (50000 UNIT) CAPS capsule Take 1 capsule (50,000 Units total) by mouth once a week.  ? ?No current facility-administered medications for this visit. (Other)  ? ? ? ? ?REVIEW OF SYSTEMS: ?ROS   ?  Negative for: Constitutional, Gastrointestinal, Neurological, Skin, Genitourinary, Musculoskeletal, HENT, Endocrine, Cardiovascular, Eyes, Respiratory, Psychiatric, Allergic/Imm, Heme/Lymph ?Last edited by Silvestre Moment on 01/07/2022  3:16 PM.  ?  ? ? ? ?ALLERGIES ?Allergies  ?Allergen Reactions  ? Fructose Other (See Comments)  ?  Increase of blood sugar ?  ? ? ?PAST MEDICAL HISTORY ?Past Medical History:  ?Diagnosis Date  ? Diabetes mellitus   ? lantus/novolog  ? Hyperlipidemia   ? Hypertension   ? Marijuana abuse   ? occaisionally  ? Noncompliance with medication regimen   ? Stroke [facility]) 2020  ? Tobacco abuse   ? 5/day  ? ?Past Surgical History:  ?Procedure Laterality Date  ? AV FISTULA PLACEMENT Left 01/08/2021  ? Procedure: LEFT ARM  ARTERIOVENOUS (AV) FISTULA CREATION;  Surgeon: [name], MD;  Location: [facility];  Service: Vascular;  Laterality: Left;  ? EYE SURGERY    ? ? ?FAMILY HISTORY ?Family History  ?Problem Relation Age of Onset  ? Diabetes Father   ? Kidney failure Father   ?     last 4 mnths of life  ? Hypertension Mother   ? Diabetes Maternal Grandmother   ? Pancreatic cancer Maternal Grandfather   ? Diabetes Paternal Grandfather   ? ? ?SOCIAL HISTORY ?Social History  ? ?Tobacco Use  ? Smoking status: Every Day  ?  Packs/day: 0.50  ?  Years: 8.00  ?  Pack years: 4.00  ?  Types: Cigarettes  ? Smokeless tobacco: Never  ? Tobacco comments:  ?  smoke 5-8 per day  ?Vaping Use  ? Vaping Use: Never used  ?Substance Use Topics  ? Alcohol use: No  ? Drug use: Yes  ?  Types: Marijuana  ?  Comment: last use yesterday, every other days   ? ?  ? ?  ? ?OPHTHALMIC EXAM: ? ?Base Eye Exam   ? ? Visual Acuity (ETDRS)   ? ?   Right Left  ? Dist [place] 20/30 +2 20/25 -2  ? ?  ?  ? ? Tonometry (Tonopen, 3:21 PM)   ? ?   Right Left  ? Pressure 13 14  ? ?  ?  ? ? Pupils   ? ?   Pupils APD  ? Right PERRL None  ? Left PERRL None  ? ?  ?  ? ? Visual Fields   ? ?   Left Right  ?  Full Full  ? ?  ?  ? ? Extraocular Movement   ? ?   Right Left  ?  Full Full  ? ?  ?  ? ? Neuro/Psych   ? ? Oriented x3: Yes  ? Mood/Affect: Normal  ? ?  ?  ? ? Dilation   ? ? Right eye: 2.5% Phenylephrine, 1.0% Mydriacyl @ 3:21 PM  ? ?  ?  ? ?  ? ?Slit Lamp and Fundus Exam   ? ? External Exam   ? ?   Right Left  ? External Normal Normal  ? ?  ?  ? ? Slit Lamp Exam   ? ?   Right Left  ? Lids/Lashes Normal Normal  ? Conjunctiva/Sclera White and quiet White and quiet  ? Cornea Clear Clear  ? Anterior Chamber Deep and quiet Deep and quiet  ? Iris Round and reactive Round and reactive  ? Lens Posterior chamber intraocular lens Posterior chamber intraocular lens  ? Anterior Vitreous Normal Normal  ? ?  ?  ? ?  Fundus Exam   ? ?   Right Left  ? Posterior Vitreous Normal Clear vitrectomized  ?  Disc Normal Normal  ? C/D Ratio 0.45 0.45  ? Macula no macular thickening, Microaneurysms no macular thickening, Microaneurysms  ? Vessels PDR-, no active PDR yet incomplete PRP PDR-quiet  ? Periphery Good PRP, attached, room posteriorly for PRP, and superiorly room for PRP Good PRP, 360, attached  ? ?  ?  ? ?  ? ? ?IMAGING AND PROCEDURES  ?Imaging and Procedures for 01/07/22 ? ?Panretinal Photocoagulation - OD - Right Eye   ? ?   ?Anesthesia ?Topical anesthesia was used. Anesthetic medications included Proparacaine 0.5%.  ? ?Laser Information ?The type of laser was diode. Color was yellow. The duration in seconds was 0.02. The spot size was 390 microns. Laser power was 230. Total spots was 638.  ? ?Post-op ?The patient tolerated the procedure well. There were no complications. The patient received written and verbal post procedure care education.  ? ?Notes ?PRP applied superiorly and temporally OD ? ?  ? ? ?  ?  ? ?  ?ASSESSMENT/PLAN: ? 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 ?  ICD-10-CM   ?1. 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  ?2. 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  ? ? ?1.  Completion of PRP superiorly as well as temporally large 1 does not prior treated, monitor and observe ? ?2.  Stable over time ? ?3. ? ?Ophthalmic Meds Ordered this visit:  ?No orders of the  defined types were placed in this encounter. ? ? ?  ? ?Return in about 6 months (around 07/09/2022) for DILATE OU, COLOR FP, OCT. ? ?There are no Patient Instructions on file for this visit. ? ? ?Explained the

## 2022-01-07 NOTE — Assessment & Plan Note (Signed)
Quiescent OS ?

## 2022-01-20 ENCOUNTER — Encounter (HOSPITAL_COMMUNITY): Payer: Self-pay | Admitting: *Deleted

## 2022-01-20 ENCOUNTER — Emergency Department (HOSPITAL_COMMUNITY)
Admission: EM | Admit: 2022-01-20 | Discharge: 2022-01-20 | Disposition: A | Discharge status: Home / Self Care | Payer: Medicare HMO | Attending: Emergency Medicine | Admitting: Emergency Medicine

## 2022-01-20 ENCOUNTER — Other Ambulatory Visit: Payer: Self-pay

## 2022-01-20 DIAGNOSIS — Z794 Long term (current) use of insulin: Secondary | ICD-10-CM | POA: Insufficient documentation

## 2022-01-20 DIAGNOSIS — Z7901 Long term (current) use of anticoagulants: Secondary | ICD-10-CM | POA: Diagnosis not present

## 2022-01-20 DIAGNOSIS — R000 Tachycardia, unspecified: Secondary | ICD-10-CM | POA: Insufficient documentation

## 2022-01-20 DIAGNOSIS — E10649 Type 1 diabetes mellitus with hypoglycemia without coma: Secondary | ICD-10-CM | POA: Diagnosis not present

## 2022-01-20 DIAGNOSIS — D631 Anemia in chronic kidney disease: Secondary | ICD-10-CM | POA: Diagnosis not present

## 2022-01-20 DIAGNOSIS — E162 Hypoglycemia, unspecified: Secondary | ICD-10-CM

## 2022-01-20 DIAGNOSIS — N189 Chronic kidney disease, unspecified: Secondary | ICD-10-CM | POA: Diagnosis not present

## 2022-01-20 DIAGNOSIS — Z7982 Long term (current) use of aspirin: Secondary | ICD-10-CM | POA: Diagnosis not present

## 2022-01-20 LAB — CBC WITH DIFFERENTIAL/PLATELET
Abs Immature Granulocytes: 0.03 10*3/uL (ref 0.00–0.07)
Basophils Absolute: 0.1 10*3/uL (ref 0.0–0.1)
Basophils Relative: 1 %
Eosinophils Absolute: 0.5 10*3/uL (ref 0.0–0.5)
Eosinophils Relative: 6 %
HCT: 34 % — ABNORMAL LOW (ref 39.0–52.0)
Hemoglobin: 10.5 g/dL — ABNORMAL LOW (ref 13.0–17.0)
Immature Granulocytes: 0 %
Lymphocytes Relative: 24 %
Lymphs Abs: 2.2 10*3/uL (ref 0.7–4.0)
MCH: 30.2 pg (ref 26.0–34.0)
MCHC: 30.9 g/dL (ref 30.0–36.0)
MCV: 97.7 fL (ref 80.0–100.0)
Monocytes Absolute: 0.5 10*3/uL (ref 0.1–1.0)
Monocytes Relative: 6 %
Neutro Abs: 5.8 10*3/uL (ref 1.7–7.7)
Neutrophils Relative %: 63 %
Platelets: 270 10*3/uL (ref 150–400)
RBC: 3.48 MIL/uL — ABNORMAL LOW (ref 4.22–5.81)
RDW: 13.9 % (ref 11.5–15.5)
WBC: 9.2 10*3/uL (ref 4.0–10.5)
nRBC: 0 % (ref 0.0–0.2)

## 2022-01-20 LAB — CBG MONITORING, ED
Glucose-Capillary: 119 mg/dL — ABNORMAL HIGH (ref 70–99)
Glucose-Capillary: 92 mg/dL (ref 70–99)
Glucose-Capillary: 190 mg/dL — ABNORMAL HIGH (ref 70–99)
Glucose-Capillary: 274 mg/dL — ABNORMAL HIGH (ref 70–99)

## 2022-01-20 LAB — BASIC METABOLIC PANEL
Anion gap: 9 (ref 5–15)
BUN: 50 mg/dL — ABNORMAL HIGH (ref 6–20)
CO2: 18 mmol/L — ABNORMAL LOW (ref 22–32)
Calcium: 8.9 mg/dL (ref 8.9–10.3)
Chloride: 111 mmol/L (ref 98–111)
Creatinine, Ser: 3.96 mg/dL — ABNORMAL HIGH (ref 0.61–1.24)
GFR, Estimated: 19 mL/min — ABNORMAL LOW (ref >60)
Glucose, Bld: 82 mg/dL (ref 70–99)
Potassium: 4.6 mmol/L (ref 3.5–5.1)
Sodium: 138 mmol/L (ref 135–145)

## 2022-01-20 MED ORDER — SODIUM CHLORIDE 0.9% FLUSH: 3.0000 mL | Freq: Two times a day (BID) | INTRAVENOUS | Status: DC

## 2022-01-20 MED ORDER — SODIUM CHLORIDE 0.9 % IV SOLN: 250.0000 mL | INTRAVENOUS | Status: DC | PRN

## 2022-01-20 MED ORDER — SODIUM CHLORIDE 0.9% FLUSH: 3.0000 mL | INTRAVENOUS | Status: DC | PRN
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
## 2022-01-20 NOTE — ED Notes (Signed)
Patient given apple juice, soda, [place] sandwhich, and chips.  ?

## 2022-01-21 LAB — HEMOGLOBIN A1C: Hemoglobin A1C: 9.4

## 2022-01-22 ENCOUNTER — Other Ambulatory Visit: Payer: Self-pay

## 2022-01-22 ENCOUNTER — Emergency Department (HOSPITAL_COMMUNITY)
Admission: EM | Admit: 2022-01-22 | Discharge: 2022-01-22 | Disposition: A | Discharge status: Home / Self Care | Payer: Medicare HMO | Attending: Student | Admitting: Student

## 2022-01-22 ENCOUNTER — Encounter (HOSPITAL_COMMUNITY): Payer: Self-pay

## 2022-01-22 DIAGNOSIS — N1832 Chronic kidney disease, stage 3b: Secondary | ICD-10-CM | POA: Diagnosis not present

## 2022-01-22 DIAGNOSIS — E875 Hyperkalemia: Secondary | ICD-10-CM | POA: Insufficient documentation

## 2022-01-22 DIAGNOSIS — Z79899 Other long term (current) drug therapy: Secondary | ICD-10-CM | POA: Insufficient documentation

## 2022-01-22 DIAGNOSIS — E1022 Type 1 diabetes mellitus with diabetic chronic kidney disease: Secondary | ICD-10-CM | POA: Insufficient documentation

## 2022-01-22 DIAGNOSIS — Z7982 Long term (current) use of aspirin: Secondary | ICD-10-CM | POA: Diagnosis not present

## 2022-01-22 DIAGNOSIS — I129 Hypertensive chronic kidney disease with stage 1 through stage 4 chronic kidney disease, or unspecified chronic kidney disease: Secondary | ICD-10-CM | POA: Insufficient documentation

## 2022-01-22 DIAGNOSIS — Z794 Long term (current) use of insulin: Secondary | ICD-10-CM | POA: Diagnosis not present

## 2022-01-22 DIAGNOSIS — R7989 Other specified abnormal findings of blood chemistry: Secondary | ICD-10-CM | POA: Insufficient documentation

## 2022-01-22 DIAGNOSIS — F17210 Nicotine dependence, cigarettes, uncomplicated: Secondary | ICD-10-CM | POA: Insufficient documentation

## 2022-01-22 LAB — COMPREHENSIVE METABOLIC PANEL
ALT: 13 U/L (ref 0–44)
AST: 14 U/L — ABNORMAL LOW (ref 15–41)
Albumin: 3.5 g/dL (ref 3.5–5.0)
Alkaline Phosphatase: 81 U/L (ref 38–126)
Anion gap: 6 (ref 5–15)
BUN: 57 mg/dL — ABNORMAL HIGH (ref 6–20)
CO2: 20 mmol/L — ABNORMAL LOW (ref 22–32)
Calcium: 8.6 mg/dL — ABNORMAL LOW (ref 8.9–10.3)
Chloride: 111 mmol/L (ref 98–111)
Creatinine, Ser: 3.83 mg/dL — ABNORMAL HIGH (ref 0.61–1.24)
GFR, Estimated: 20 mL/min — ABNORMAL LOW (ref >60)
Glucose, Bld: 147 mg/dL — ABNORMAL HIGH (ref 70–99)
Potassium: 5.3 mmol/L — ABNORMAL HIGH (ref 3.5–5.1)
Sodium: 137 mmol/L (ref 135–145)
Total Bilirubin: 0.3 mg/dL (ref 0.3–1.2)
Total Protein: 7.1 g/dL (ref 6.5–8.1)

## 2022-01-22 LAB — I-STAT CHEM 8, ED
BUN: 51 mg/dL — ABNORMAL HIGH (ref 6–20)
Calcium, Ion: 1.2 mmol/L (ref 1.15–1.40)
Chloride: 110 mmol/L (ref 98–111)
Creatinine, Ser: 4.3 mg/dL — ABNORMAL HIGH (ref 0.61–1.24)
Glucose, Bld: 147 mg/dL — ABNORMAL HIGH (ref 70–99)
HCT: 32 % — ABNORMAL LOW (ref 39.0–52.0)
Hemoglobin: 10.9 g/dL — ABNORMAL LOW (ref 13.0–17.0)
Potassium: 5.5 mmol/L — ABNORMAL HIGH (ref 3.5–5.1)
Sodium: 139 mmol/L (ref 135–145)
TCO2: 20 mmol/L — ABNORMAL LOW (ref 22–32)

## 2022-01-22 LAB — CBC WITH DIFFERENTIAL/PLATELET
Abs Immature Granulocytes: 0.03 10*3/uL (ref 0.00–0.07)
Basophils Absolute: 0.1 10*3/uL (ref 0.0–0.1)
Basophils Relative: 1 %
Eosinophils Absolute: 0.6 10*3/uL — ABNORMAL HIGH (ref 0.0–0.5)
Eosinophils Relative: 7 %
HCT: 32.6 % — ABNORMAL LOW (ref 39.0–52.0)
Hemoglobin: 10.6 g/dL — ABNORMAL LOW (ref 13.0–17.0)
Immature Granulocytes: 0 %
Lymphocytes Relative: 27 %
Lymphs Abs: 2.5 10*3/uL (ref 0.7–4.0)
MCH: 31 pg (ref 26.0–34.0)
MCHC: 32.5 g/dL (ref 30.0–36.0)
MCV: 95.3 fL (ref 80.0–100.0)
Monocytes Absolute: 0.5 10*3/uL (ref 0.1–1.0)
Monocytes Relative: 5 %
Neutro Abs: 5.5 10*3/uL (ref 1.7–7.7)
Neutrophils Relative %: 60 %
Platelets: 247 10*3/uL (ref 150–400)
RBC: 3.42 MIL/uL — ABNORMAL LOW (ref 4.22–5.81)
RDW: 13.7 % (ref 11.5–15.5)
WBC: 9.2 10*3/uL (ref 4.0–10.5)
nRBC: 0 % (ref 0.0–0.2)

## 2022-01-22 MED ORDER — SODIUM ZIRCONIUM CYCLOSILICATE 5 G PO PACK
10.0000 g | PACK | Freq: Once | ORAL | Status: AC
  Administered 2022-01-22: 10 g via ORAL
  Filled 2022-01-22: qty 2

## 2022-01-22 NOTE — ED Triage Notes (Addendum)
Pt Came with mom who with c/o abnormal labs. Dr. Legrand Rams sent pt here for evaluation. Pt uses a cane to ambulate. Mother verbalized she has not went through court to legalize any paperwork for legal guardianship.  ?
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
## 2022-02-04 ENCOUNTER — Ambulatory Visit (INDEPENDENT_AMBULATORY_CARE_PROVIDER_SITE_OTHER): Payer: Medicare HMO | Admitting: Endocrinology

## 2022-02-04 ENCOUNTER — Encounter: Payer: Self-pay | Admitting: Endocrinology

## 2022-02-04 VITALS — BP 122/64 | HR 87 | Ht 71.0 in | Wt 141.6 lb

## 2022-02-04 DIAGNOSIS — E1065 Type 1 diabetes mellitus with hyperglycemia: Secondary | ICD-10-CM

## 2022-02-04 MED ORDER — OMNIPOD 5 DEXG7G6 PODS GEN 5 MISC: 1.0000 | 3 refills | Status: DC

## 2022-02-04 MED ORDER — OMNIPOD 5 DEXG7G6 INTRO GEN 5 KIT: 1.0000 | PACK | Freq: Once | 0 refills | Status: AC

## 2022-02-04 NOTE — Progress Notes (Signed)
Patient ID: Nathaniel [name], male   DOB: Feb 04, 1986, 36 y.o.   MRN: [id number]           Reason for Appointment : Follow-up for Type 1 Diabetes  History of Present Illness           Diagnosis: Type 1 diabetes mellitus, date of diagnosis:  ?  2004        Previous history:  He has had recurrent hospitalizations for ketoacidosis especially in 2017 and 2018, last episode was in 01/2018 Overall has had consistently poor control with A1c as high as 16 In the past he has required large doses of at least basal insulin, taking up to 200 units of Lantus daily at some point  Recent history:   INSULIN regimen is: Novolog usually 4-5 units at meals  Tresiba usually 10 units daily  His A1c is 9.4 done by PCP   Current management, blood sugar patterns and problems identified:    Glucose monitoring: He still is relying usually on his mother to do adjust insulin injections  Even though his blood sugars have been very labile he is now appearing to require much less insulin  He has had frequent hypoglycemia His low blood sugar episode in the late afternoon took him to the emergency room recently  Hypoglycemia is mostly related to his nighttime snacks which are not covered with NovoLog  Blood sugars may be somewhat labile and not as consistently low in the last week compared to the week before  High variability is present during the night and evenings  It was told to take as much as 10 units of NovoLog but taking only 4-5 Not clear if he takes insulin before starting to eat or after, conflicting answers today  Does not take coverage for glucose or low as he was advised  With high-fat snacks blood sugars stay high for several hours at night and then drop significantly in the morning hours without any extra insulin apparently  Not taking NovoLog at meals based on carbohydrate content and usually the same Appetite is variable However weight is improving  Interpretation of the Dexcom sensor  download for the last 2 weeks:  GMI 7.3  HYPOGLYCEMIA is very significant with 12% of readings below 54  Moderate variability is present especially overnight HYPERGLYCEMIC episodes are frequently present overnight but periodically after 6 PM also HYPOGLYCEMIA is occurring almost daily and can occur at any time but less likely in the early afternoon  POSTPRANDIAL readings are quite variable but on an average not much higher than Premeal readings in the evening Frequently blood sugars will rise significantly after midnight for several hours then drop significantly Time in range is only 39% similar to the last visit  Statistics:  CGM use % of time 92  2-week average/GV 166/56  Time in range    39    %  % Time Above 180 22  % Time above 250 21  % Time Below 70 6+12     CGM use % of time 100  2-week average/GV 230/40  Time in range 35, previously 43  % Time Above 180 21+41  % Time above 250   % Time Below 70 2    Hypoglycemia:  As above Symptoms of hypoglycemia: Feeling tired, sweaty, slurred speech, weakness Treatment of hypoglycemia: Juice usually, sometimes glucose tablets, usually 2 at a time     Self-care: The diet that the patient has been following is: None  Mealtimes are: Very variable  Dietician consultation: Most recent: 06/2018     CDE consultation: Years ago  Diabetes labs:  Lab Results  Component Value Date   HGBA1C 9.4 01/21/2022   HGBA1C 11.2 (A) 11/23/2021   HGBA1C 10.7 (A) 07/20/2021   Lab Results  Component Value Date   LDLCALC 138 (H) 05/18/2021   CREATININE 4.30 (H) 01/22/2022    No results found for: MICRALBCREAT  Wt Readings from Last 3 Encounters:  02/04/22 141 lb 9.6 oz (64.2 kg)  01/22/22 137 lb (62.1 kg)  01/20/22 165 lb (74.8 kg)     Allergies as of 02/04/2022       Reactions   Fructose Other (See Comments)   Increase of blood sugar        Medication List        Accurate as of Feb 04, 2022 11:59 PM. If you  have any questions, ask your nurse or doctor.          acetaminophen 325 MG tablet Commonly known as: TYLENOL Take 1-2 tablets (325-650 mg total) by mouth every 4 (four) hours as needed for mild pain.   amLODipine 10 MG tablet Commonly known as: NORVASC Take 1 tablet (10 mg total) by mouth daily. For blood pressure   Aspirin Low Dose 81 MG tablet Generic drug: aspirin EC Take 81 mg by mouth every morning.   atorvastatin 80 MG tablet Commonly known as: LIPITOR Take 1 tablet (80 mg total) by mouth every evening. For stroke prevention   clopidogrel 75 MG tablet Commonly known as: PLAVIX Take 1 tablet (75 mg total) by mouth daily. For stroke prevention   Dexcom G6 Receiver Devi Check sugar 4 times a day   Dexcom G6 Sensor Misc 1 each by Does not apply route every 14 (fourteen) days.   Dexcom G6 Transmitter Misc Check sugar 4 times a day   DULoxetine 20 MG capsule Commonly known as: Cymbalta Take 1 capsule (20 mg total) by mouth daily.   insulin aspart 100 UNIT/ML FlexPen Commonly known as: NOVOLOG Inject per sliding scale before mealsI: Blood sugars between 200-250 Take 2 units, 250-300 take 3 units, Over 300 Take 4 units What changed:  how much to take how to take this when to take this   labetalol 100 MG tablet Commonly known as: NORMODYNE Take 1 tablet (100 mg total) by mouth 2 (two) times daily. For BP   lisinopril 40 MG tablet Commonly known as: ZESTRIL Take 1 tablet (40 mg total) by mouth daily. For blood pressure   Omnipod 5 G6 Intro (Gen 5) Kit 1 kit by Does not apply route once for 1 dose. Started by: Elayne Snare, MD   Omnipod 5 G6 Pod (Gen 5) Misc 1 Device by Does not apply route every 3 (three) days. Started by: Elayne Snare, MD   OneTouch Ultra test strip Generic drug: glucose blood USE TO TEST BLOOD SUGAR 4 TIMES A DAY   Pen Needles 3/16" 31G X 5 MM Misc Four times day   promethazine 12.5 MG tablet Commonly known as: PHENERGAN Take 1  tablet (12.5 mg total) by mouth every 6 (six) hours as needed for nausea.   [name] FlexTouch 100 UNIT/ML FlexTouch Pen Generic drug: insulin degludec INJECT UP TO 10 UNITS ONCE DAILY AS DIRECTED - PLUS UP TO 5 UNITS IF BLOOD SUGAR IS ABOVE 200 - PER SLIDING SCALE PROVIDED BY PHYSICIAN   Vitamin D (Ergocalciferol) 1.25 MG (50000 UNIT) Caps capsule Commonly known as: DRISDOL Take 1 capsule (50,000 Units  total) by mouth once a week.        Allergies:  Allergies  Allergen Reactions   Fructose Other (See Comments)    Increase of blood sugar     Past Medical History:  Diagnosis Date   Diabetes mellitus    lantus/novolog   Hyperlipidemia    Hypertension    Marijuana abuse    occaisionally   Noncompliance with medication regimen    Stroke ([place]) 2020   Tobacco abuse    5/day    Past Surgical History:  Procedure Laterality Date   AV FISTULA PLACEMENT Left 01/08/2021   Procedure: LEFT ARM ARTERIOVENOUS (AV) FISTULA CREATION;  Surgeon: [name], MD;  Location: [facility];  Service: Vascular;  Laterality: Left;   EYE SURGERY      Family History  Problem Relation Age of Onset   Diabetes Father    Kidney failure Father        last 53 mnths of life   Hypertension Mother    Diabetes Maternal Grandmother    Pancreatic cancer Maternal Grandfather    Diabetes Paternal Grandfather     Social History:  reports that he has been smoking cigarettes. He has a 4.00 pack-year smoking history. He has never used smokeless tobacco. He reports current drug use. Drug: Marijuana. He reports that he does not drink alcohol.      Review of Systems      Lipids: After his CVA in June 2019 he was started on Lipitor 80 mg Recent LDL from PCP is 72  Lab Results  Component Value Date   CHOL 204 (H) 05/18/2021   HDL 37.60 (L) 05/18/2021   LDLCALC 138 (H) 05/18/2021   TRIG 142.0 05/18/2021   CHOLHDL 5 05/18/2021   HYPERTENSION: Has been on treatment for several years from  nephrologist  Blood pressure medications including lisinopril 40 mg, and amlodipine    BP Readings from Last 3 Encounters:  02/04/22 122/64  01/22/22 134/85  01/20/22 (!) 149/85    Has chronic kidney disease, seen by nephrologist regularly and has not been advised dialysis as yet   Lab Results  Component Value Date   CREATININE 4.30 (H) 01/22/2022   CREATININE 3.83 (H) 01/22/2022   CREATININE 3.96 (H) 01/20/2022   He has had a thyroid nodule on the right side, ultrasound has been ordered a couple of times but he has not gone for this  Lab Results  Component Value Date   TSH 0.762 07/14/2020      Physical Examination:  BP 122/64   Pulse 87   Ht '5\' 11"'  (1.803 m)   Wt 141 lb 9.6 oz (64.2 kg)   SpO2 99%   BMI 19.75 kg/m     ASSESSMENT:  Diabetes type 1, persistently poorly controlled  A1c is 9.4 about 2 weeks ago  His blood sugars are consistently poorly controlled with labile readings and now more hypoglycemia Again hyperglycemia related to meals but mostly from high fat snacks in the evenings and overnight and also unexpected high sugars overnight With excessive basal insulin his glucose usually tends to drop sharply early morning but also has low sugars at all different times based on his intake Has difficulty calculating mealtime or snack coverage Discussed dangers of severe hypoglycemia   HISTORY of hypercholesterolemia: Better controlled with improved compliance   PLAN:  Reduce Tresiba down by 3 units to 7 units We will increase the dose only if he is has persistently high readings on waking up Not  adjust this every day Discussed importance of covering nighttime snacks with at least 2 to 3 units of NovoLog even with.  Butter crackers or chips Make sure he takes mealtime NovoLog right after eating and continue 4-5 units but 6 to 7 units for higher fat meals or more carbohydrate Simple sugars for treating low sugars Call if having persistently low  sugars  Today in detail explained the potential for using the OmniPod 5 closed-loop insulin system and benefit from this as well as how it is going to be used Prescription will be sent and he will be trained once approved  Patient Instructions  TRESIBA 7 UNITS   NOVOLOG 2-3 FOR nite snacks and 5-7 Novolog      Total visit time for management of diabetes and counseling regarding insulin regimen and management of hyperglycemia and hypoglycemia = 30 minutes   Elayne Snare 02/05/2022, 2:03 PM      Note: This note was prepared with Dragon voice recognition system technology. Any transcriptional errors that result from this process are unintentional.

## 2022-02-04 NOTE — Patient Instructions (Addendum)
TRESIBA 7 UNITS   NOVOLOG 2-3 FOR nite snacks and 5-7 Novolog

## 2022-02-25 ENCOUNTER — Encounter: Payer: Self-pay | Admitting: Endocrinology

## 2022-02-26 ENCOUNTER — Telehealth: Payer: Self-pay

## 2022-02-26 NOTE — Telephone Encounter (Signed)
[employer] ([employer]) requested most recent chart notes be faxed to [phone]. Office notes faxed electronically from chart.

## 2022-04-21 ENCOUNTER — Other Ambulatory Visit: Payer: Self-pay | Admitting: Endocrinology

## 2022-04-21 DIAGNOSIS — E1065 Type 1 diabetes mellitus with hyperglycemia: Secondary | ICD-10-CM

## 2022-04-23 ENCOUNTER — Other Ambulatory Visit: Payer: Self-pay

## 2022-04-23 DIAGNOSIS — E1065 Type 1 diabetes mellitus with hyperglycemia: Secondary | ICD-10-CM

## 2022-04-23 MED ORDER — INSULIN ASPART 100 UNIT/ML FLEXPEN: PEN_INJECTOR | SUBCUTANEOUS | 1 refills | Status: DC

## 2022-05-10 ENCOUNTER — Ambulatory Visit (INDEPENDENT_AMBULATORY_CARE_PROVIDER_SITE_OTHER): Payer: Medicare HMO | Admitting: Endocrinology

## 2022-05-10 ENCOUNTER — Encounter: Payer: Self-pay | Admitting: Endocrinology

## 2022-05-10 VITALS — BP 98/58 | HR 88 | Ht 71.0 in | Wt 135.8 lb

## 2022-05-10 DIAGNOSIS — E1065 Type 1 diabetes mellitus with hyperglycemia: Secondary | ICD-10-CM | POA: Diagnosis not present

## 2022-05-10 MED ORDER — OMNIPOD 5 DEXG7G6 PODS GEN 5 MISC: 1.0000 | 3 refills | Status: DC

## 2022-05-10 MED ORDER — OMNIPOD 5 DEXG7G6 INTRO GEN 5 KIT: 1.0000 | PACK | Freq: Once | 0 refills | Status: AC

## 2022-05-10 NOTE — Progress Notes (Unsigned)
Patient ID: Nathaniel [name], male   DOB: 22-Nov-1985, 36 y.o.   MRN: [id number]           Reason for Appointment : Follow-up for Type 1 Diabetes  History of Present Illness           Diagnosis: Type 1 diabetes mellitus, date of diagnosis:  ?  2004        Previous history:  He has had recurrent hospitalizations for ketoacidosis especially in 2017 and 2018, last episode was in 01/2018 Overall has had consistently poor control with A1c as high as 16 In the past he has required large doses of at least basal insulin, taking up to 200 units of Lantus daily at some point  Recent history:   INSULIN regimen is: Novolog usually 4-5 units at meals  Tresiba 10 units daily  His A1c is higher at 10.2 compared to 9.4 done by PCP   Current management, blood sugar patterns and problems identified:    Glucose monitoring: He recently appears to be doing more of his insulin injections on his own  With this he has taken a stable dose of [name] and not adjusting it based on blood sugar level at the time of injection  However does not appear to be taking enough NovoLog to cover his meals and most of his high readings are later in the evenings and continue to be high late at night for unknown reasons  He thinks he will take up to 10 units when he is eating a full meal and not clear if he is taking his right after eating  Only once his blood sugar was relatively low with taking his NovoLog and eating smaller meals  Has been for likely with high-fat snacks blood sugars stay high for several hours at night He does not take any correction doses He is generally waking up with fairly good readings but this is variable again and no nocturnal hypoglycemia now except once a week ago  Interpretation of the Dexcom sensor download for the last 2 weeks:  GMI 7.8, previously 7.3  Generally blood sugars are at or above the 180 target most of the time except in the afternoons  HIGHEST blood sugars are overall  late at night generally between 11 PM-2 AM  Moderate variability is present especially after about 6 PM  HYPOGLYCEMIA is very infrequent and only occurred in the first week around 6-7 AM and 1 episode around 8-9 PM  HYPERGLYCEMIC episodes are occurring frequently in the evenings at variable times but most frequently late in the evening before midnight OVERNIGHT blood sugars are usually starting of high around midnight and progressively declining till about 1 or 2 PM with marked variability Time in range is slightly better at 45 compared to 39% Previous 3  Statistics:  CGM use % of time   2-week average/GV 187/41  Time in range     45   %  % Time Above 180 31  % Time above 250 19  % Time Below 70 5     Previously:   CGM use % of time 92  2-week average/GV 166/56  Time in range    39    %  % Time Above 180 22  % Time above 250 21  % Time Below 70 6+12     CGM use % of time 100  2-week average/GV 230/40  Time in range 35, previously 43  % Time Above 180 21+41  % Time above 250   %  Time Below 70 2    Hypoglycemia:  As above Symptoms of hypoglycemia: Feeling tired, sweaty, slurred speech, weakness Treatment of hypoglycemia: Juice usually, sometimes glucose tablets, usually 2 at a time     Self-care: The diet that the patient has been following is: None  Mealtimes are: Very variable              Dietician consultation: Most recent: 06/2018     CDE consultation: Years ago  Diabetes labs:  Lab Results  Component Value Date   HGBA1C 9.4 01/21/2022   HGBA1C 11.2 (A) 11/23/2021   HGBA1C 10.7 (A) 07/20/2021   Lab Results  Component Value Date   LDLCALC 138 (H) 05/18/2021   CREATININE 4.30 (H) 01/22/2022    No results found for: "MICRALBCREAT"  Wt Readings from Last 3 Encounters:  05/10/22 135 lb 12.8 oz (61.6 kg)  02/04/22 141 lb 9.6 oz (64.2 kg)  01/22/22 137 lb (62.1 kg)     Allergies as of 05/10/2022       Reactions   Fructose Other (See Comments)    Increase of blood sugar        Medication List        Accurate as of May 10, 2022 11:59 PM. If you have any questions, ask your nurse or doctor.          acetaminophen 325 MG tablet Commonly known as: TYLENOL Take 1-2 tablets (325-650 mg total) by mouth every 4 (four) hours as needed for mild pain.   amLODipine 10 MG tablet Commonly known as: NORVASC Take 1 tablet (10 mg total) by mouth daily. For blood pressure   Aspirin Low Dose 81 MG tablet Generic drug: aspirin EC Take 81 mg by mouth every morning.   atorvastatin 80 MG tablet Commonly known as: LIPITOR Take 1 tablet (80 mg total) by mouth every evening. For stroke prevention   clopidogrel 75 MG tablet Commonly known as: PLAVIX Take 1 tablet (75 mg total) by mouth daily. For stroke prevention   Dexcom G6 Receiver Devi Check sugar 4 times a day   Dexcom G6 Sensor Misc 1 each by Does not apply route every 14 (fourteen) days.   Dexcom G6 Transmitter Misc Check sugar 4 times a day   DULoxetine 20 MG capsule Commonly known as: Cymbalta Take 1 capsule (20 mg total) by mouth daily.   insulin aspart 100 UNIT/ML FlexPen Commonly known as: NOVOLOG Inject per sliding scale before mealsI: Blood sugars between 200-250 Take 2 units, 250-300 take 3 units, Over 300 Take 4 units   labetalol 100 MG tablet Commonly known as: NORMODYNE Take 1 tablet (100 mg total) by mouth 2 (two) times daily. For BP   lisinopril 40 MG tablet Commonly known as: ZESTRIL Take 1 tablet (40 mg total) by mouth daily. For blood pressure   Omnipod 5 G6 Intro (Gen 5) Kit 1 kit by Does not apply route once for 1 dose. What changed: You were already taking a medication with the same name, and this prescription was added. Make sure you understand how and when to take each. Changed by: Elayne Snare, MD   Omnipod 5 G6 Pod (Gen 5) Misc 1 Device by Does not apply route every 3 (three) days. What changed: Another medication with the same name was  added. Make sure you understand how and when to take each. Changed by: Elayne Snare, MD   OneTouch Ultra test strip Generic drug: glucose blood USE TO TEST BLOOD SUGAR 4 TIMES A  DAY   Pen Needles 3/16" 31G X 5 MM Misc Four times day   promethazine 12.5 MG tablet Commonly known as: PHENERGAN Take 1 tablet (12.5 mg total) by mouth every 6 (six) hours as needed for nausea.   [name] FlexTouch 100 UNIT/ML FlexTouch Pen Generic drug: insulin degludec INJECT UP TO 10 UNITS ONCE DAILY AS DIRECTED - PLUS UP TO 5 UNITS IF BLOOD SUGAR IS ABOVE 200 - PER SLIDING SCALE PROVIDED BY PHYSICIAN   Vitamin D (Ergocalciferol) 1.25 MG (50000 UNIT) Caps capsule Commonly known as: DRISDOL Take 1 capsule (50,000 Units total) by mouth once a week.        Allergies:  Allergies  Allergen Reactions   Fructose Other (See Comments)    Increase of blood sugar     Past Medical History:  Diagnosis Date   Diabetes mellitus    lantus/novolog   Hyperlipidemia    Hypertension    Marijuana abuse    occaisionally   Noncompliance with medication regimen    Stroke ([place]) 2020   Tobacco abuse    5/day    Past Surgical History:  Procedure Laterality Date   AV FISTULA PLACEMENT Left 01/08/2021   Procedure: LEFT ARM ARTERIOVENOUS (AV) FISTULA CREATION;  Surgeon: [name], MD;  Location: [facility];  Service: Vascular;  Laterality: Left;   EYE SURGERY      Family History  Problem Relation Age of Onset   Diabetes Father    Kidney failure Father        last 45 mnths of life   Hypertension Mother    Diabetes Maternal Grandmother    Pancreatic cancer Maternal Grandfather    Diabetes Paternal Grandfather     Social History:  reports that he has been smoking cigarettes. He has a 4.00 pack-year smoking history. He has never used smokeless tobacco. He reports current drug use. Drug: Marijuana. He reports that he does not drink alcohol.      Review of Systems      Lipids: After his CVA in June 2019 he  was started on Lipitor 80 mg Last LDL from PCP is 72  Lab Results  Component Value Date   CHOL 204 (H) 05/18/2021   HDL 37.60 (L) 05/18/2021   LDLCALC 138 (H) 05/18/2021   TRIG 142.0 05/18/2021   CHOLHDL 5 05/18/2021   HYPERTENSION: Has been on treatment for several years from nephrologist  Blood pressure medications including lisinopril 40 mg, and amlodipine    BP Readings from Last 3 Encounters:  05/10/22 (!) 98/58  02/04/22 122/64  01/22/22 134/85    Has chronic kidney disease, seen by nephrologist regularly and has not been advised dialysis  Lab Results  Component Value Date   CREATININE 4.30 (H) 01/22/2022   CREATININE 3.83 (H) 01/22/2022   CREATININE 3.96 (H) 01/20/2022   He has had a thyroid nodule on the right side, ultrasound has been ordered a couple of times but he has not gone for this  Lab Results  Component Value Date   TSH 0.762 07/14/2020   Last eye exam with Dr Katy Fitch 4/23   Physical Examination:  BP (!) 98/58   Pulse 88   Ht 5' 11" (1.803 m)   Wt 135 lb 12.8 oz (61.6 kg)   SpO2 99%   BMI 18.94 kg/m     ASSESSMENT:  Diabetes type 1, persistently poorly controlled  A1c is 10.2  His blood sugars are somewhat better overall and less hypoglycemia There is partly from  making sure he is not adjusting his [name] however currently on a daily basis However as before his postprandial readings are generally poorly controlled because of inadequate mealtime insulin Appears to be needing much more mealtime coverage compared to basal and likely is not getting consistent control even with 10 units NovoLog Currently not trying to do any correction doses for high readings also    PLAN:  Again discussed in detail how to adjust basal and bolus insulin He does need to take up to 14 or even more units of insulin for his evening meal based on his intake Also needs to cover snacks with extra insulin as well as 3 to 4 units for correction doses if blood  sugars are still over 250 about 4 hours after his mealtime coverage dose  Again discussed the benefit of a closed-loop system for diabetes management especially using the OmniPod 5 insulin pump and how this would make his blood sugar control better and avoid low sugars also His mother will contact the phone number for the pharmacy with a prescription was sent Prescription will be sent again and he will be trained once approved  Patient Instructions  [name] same  Novolog as much as 12-14 units if eating a full meal  Take 3-4 U Novolog if sugar >250 4 hrs after Novolog    Total visit time for management of diabetes and counseling regarding insulin regimen and management of hyperglycemia and prevention of hypoglycemia = 30 minutes   Elayne Snare 05/11/2022, 9:41 AM      Note: This note was prepared with Dragon voice recognition system technology. Any transcriptional errors that result from this process are unintentional.

## 2022-05-10 NOTE — Patient Instructions (Addendum)
[name] same  Novolog as much as 12-14 units if eating a full meal  Take 3-4 U Novolog if sugar >250 4 hrs after Novolog

## 2022-05-26 ENCOUNTER — Other Ambulatory Visit: Payer: Self-pay

## 2022-05-26 DIAGNOSIS — E1065 Type 1 diabetes mellitus with hyperglycemia: Secondary | ICD-10-CM

## 2022-05-26 MED ORDER — OMNIPOD 5 DEXG7G6 PODS GEN 5 MISC: 1.0000 | 3 refills | Status: DC

## 2022-05-26 MED ORDER — OMNIPOD 5 DEXG7G6 INTRO GEN 5 KIT: PACK | 2 refills | Status: DC

## 2022-05-31 ENCOUNTER — Encounter: Payer: Self-pay | Admitting: Endocrinology

## 2022-06-04 ENCOUNTER — Telehealth: Payer: Self-pay

## 2022-06-04 ENCOUNTER — Other Ambulatory Visit (HOSPITAL_COMMUNITY): Payer: Self-pay

## 2022-06-04 NOTE — Telephone Encounter (Signed)
Patient Advocate Encounter   Received notification that prior authorization is required for Omnipod 5 G6 Intro (Gen 5) kit  Submitted: 06-04-2022 Key [id number]   Patient Advocate Encounter   Received notification that prior authorization is required for Omnipod 5 G6 Pod (Gen 5)  Submitted: 06-04-2022 Key [id number]   Status is pending

## 2022-06-04 NOTE — Telephone Encounter (Signed)
Patient needs a PA done for the Omnipod 5 intro kit and pod. Please start PA

## 2022-06-07 ENCOUNTER — Other Ambulatory Visit (HOSPITAL_COMMUNITY): Payer: Self-pay

## 2022-06-07 NOTE — Telephone Encounter (Signed)
Patient Advocate Encounter  Received a fax regarding Prior Authorization for Omnipod 5 G6 Intro (Gen 5) kit.   Authorization has been DENIED due to The requested drug is not on your plan's formulary.    Patient Advocate Encounter  Received a fax regarding Prior Authorization for Omnipod 5 G6 Pod (Gen 5).   Authorization has been DENIED due to The requested drug is not on your plan's formulary.

## 2022-06-10 NOTE — Telephone Encounter (Signed)
Noted and I have let Stanton Kidney know and she is working on to see if she can somehow get patient approved.

## 2022-06-28 ENCOUNTER — Telehealth: Payer: Self-pay

## 2022-06-28 NOTE — Telephone Encounter (Signed)
[employer] requested chart notes from last 6 months be faxed. Fax sent

## 2022-07-12 ENCOUNTER — Encounter (INDEPENDENT_AMBULATORY_CARE_PROVIDER_SITE_OTHER): Payer: Medicare HMO | Admitting: Ophthalmology

## 2022-07-12 ENCOUNTER — Encounter (INDEPENDENT_AMBULATORY_CARE_PROVIDER_SITE_OTHER): Payer: Self-pay

## 2022-07-28 ENCOUNTER — Encounter: Payer: Self-pay | Admitting: Vascular Surgery

## 2022-07-28 ENCOUNTER — Ambulatory Visit (INDEPENDENT_AMBULATORY_CARE_PROVIDER_SITE_OTHER): Payer: Medicare HMO | Admitting: Vascular Surgery

## 2022-07-28 VITALS — BP 101/64 | HR 105 | Temp 97.9°F | Ht 71.0 in | Wt 141.4 lb

## 2022-07-28 DIAGNOSIS — N184 Chronic kidney disease, stage 4 (severe): Secondary | ICD-10-CM | POA: Diagnosis not present

## 2022-07-28 NOTE — Progress Notes (Signed)
[facility]  Patient name: Nathaniel [name] MRN: [id number] DOB: 1986-04-05 Sex: male  REASON FOR VISIT: Discuss access for hemodialysis  HPI: Nathaniel [name] is a 36 y.o. male here today for discussion of access with hemodialysis.  He has progressive renal insufficiency but is not on hemodialysis currently.  He is known to me from a prior for stage brachiobasilic fistula creation in April 2022.  He is quite thin and has very apparent surface veins but these are small.  His cephalic vein was inadequate for fistula attempt.  He did have a moderate basilic vein above his elbow and therefore underwent for stage basilic fistula.  He did not return for follow-up.  He is seen today for discussion of access.  I do not have notes to suggest timeline for hemodialysis.  His most recent creatinine in the [facility] system is from May 2023.  At that time his creatinine was 4.3.  Past Medical History:  Diagnosis Date   Diabetes mellitus    lantus/novolog   Hyperlipidemia    Hypertension    Marijuana abuse    occaisionally   Noncompliance with medication regimen    Stroke ([place]) 2020   Tobacco abuse    5/day    Family History  Problem Relation Age of Onset   Diabetes Father    Kidney failure Father        last 12 mnths of life   Hypertension Mother    Diabetes Maternal Grandmother    Pancreatic cancer Maternal Grandfather    Diabetes Paternal Grandfather     SOCIAL HISTORY: Social History   Tobacco Use   Smoking status: Every Day    Packs/day: 0.50    Years: 8.00    Total pack years: 4.00    Types: Cigarettes   Smokeless tobacco: Never   Tobacco comments:    smoke 5-8 per day  Substance Use Topics   Alcohol use: No    Allergies  Allergen Reactions   Fructose Other (See Comments)    Increase of blood sugar     Current Outpatient Medications  Medication Sig Dispense Refill   acetaminophen (TYLENOL) 325 MG  tablet Take 1-2 tablets (325-650 mg total) by mouth every 4 (four) hours as needed for mild pain.     amLODipine (NORVASC) 10 MG tablet Take 1 tablet (10 mg total) by mouth daily. For blood pressure 90 tablet 11   ASPIRIN LOW DOSE 81 MG EC tablet Take 81 mg by mouth every morning.     atorvastatin (LIPITOR) 80 MG tablet Take 1 tablet (80 mg total) by mouth every evening. For stroke prevention 90 tablet 11   clopidogrel (PLAVIX) 75 MG tablet Take 1 tablet (75 mg total) by mouth daily. For stroke prevention 90 tablet 3   Continuous Blood Gluc Receiver (DEXCOM G6 RECEIVER) DEVI Check sugar 4 times a day 1 each 0   Continuous Blood Gluc Sensor (DEXCOM G6 SENSOR) MISC 1 each by Does not apply route every 14 (fourteen) days. 9 each 3   Continuous Blood Gluc Transmit (DEXCOM G6 TRANSMITTER) MISC Check sugar 4 times a day 1 each 3   insulin aspart (NOVOLOG) 100 UNIT/ML FlexPen Inject per sliding scale before mealsI: Blood sugars between 200-250 Take 2 units, 250-300 take 3 units, Over 300 Take 4 units 15 mL 1   Insulin Disposable Pump (OMNIPOD 5 G6 INTRO, GEN 5,) KIT CHANGE POD EVERY 3 DAYS 1 kit 2   Insulin Disposable  Pump (OMNIPOD 5 G6 POD, GEN 5,) MISC 1 Device by Does not apply route every 3 (three) days. 2 each 3   Insulin Pen Needle (PEN NEEDLES 3/16") 31G X 5 MM MISC Four times day 200 each 11   labetalol (NORMODYNE) 100 MG tablet Take 1 tablet (100 mg total) by mouth 2 (two) times daily. For BP 180 tablet 4   lisinopril (ZESTRIL) 40 MG tablet Take 1 tablet (40 mg total) by mouth daily. For blood pressure 90 tablet 3   ONETOUCH ULTRA test strip USE TO TEST BLOOD SUGAR 4 TIMES A DAY 100 strip 3   promethazine (PHENERGAN) 12.5 MG tablet Take 1 tablet (12.5 mg total) by mouth every 6 (six) hours as needed for nausea. 12 tablet 0   TRESIBA FLEXTOUCH 100 UNIT/ML FlexTouch Pen INJECT UP TO 10 UNITS ONCE DAILY AS DIRECTED - PLUS UP TO 5 UNITS IF BLOOD SUGAR IS ABOVE 200 - PER SLIDING SCALE PROVIDED BY  PHYSICIAN 15 mL 1   Vitamin D, Ergocalciferol, (DRISDOL) 1.25 MG (50000 UNIT) CAPS capsule Take 1 capsule (50,000 Units total) by mouth once a week. 5 capsule 2   DULoxetine (CYMBALTA) 20 MG capsule Take 1 capsule (20 mg total) by mouth daily. (Patient not taking: Reported on 01/20/2022) 30 capsule 2   No current facility-administered medications for this visit.    REVIEW OF SYSTEMS:  _0  denotes positive finding, _1  denotes negative finding Cardiac  Comments:  Chest pain or chest pressure:    Shortness of breath upon exertion:    Short of breath when lying flat:    Irregular heart rhythm:        Vascular    Pain in calf, thigh, or hip brought on by ambulation:    Pain in feet at night that wakes you up from your sleep:     Blood clot in your veins:    Leg swelling:           PHYSICAL EXAM: Vitals:   07/28/22 1518  BP: 101/64  Pulse: (!) 105  Temp: 97.9 F (36.6 C)  SpO2: 97%  Weight: 141 lb 6.4 oz (64.1 kg)  Height: _2  (1.803 m)    GENERAL: The patient is a well-nourished male, in no acute distress. The vital signs are documented above. CARDIOVASCULAR: 2+ radial pulses bilaterally.  I do not appreciate a thrill in his left upper arm. PULMONARY: There is good air exchange  MUSCULOSKELETAL: There are no major deformities or cyanosis. NEUROLOGIC: No focal weakness or paresthesias are detected. SKIN: There are no ulcers or rashes noted. PSYCHIATRIC: The patient has a normal affect.  DATA:  I imaged his left arm with SonoSite ultrasound.  He has had occlusion of his brachial basilic fistula.  His cephalic veins remain quite small.  MEDICAL ISSUES: I discussed this finding with the patient.  Explained that he will not be a fistula candidate and that we failed to have adequate maturation of his basilic vein.  I would recommend left upper arm AV Gore-Tex graft as his next dialysis access option.  We will check with Dr. Marval Regal to determine if he wishes placement of a  Gore-Tex graft now or wait if he feels there is some time frame before Nathaniel [name] needs hemodialysis.    Nathaniel Posner, MD FACS [facility] [phone]  Note: Portions of this report may have been transcribed using voice recognition software.  Every effort has been made to  ensure accuracy; however, inadvertent computerized transcription errors may still be present.

## 2022-08-10 ENCOUNTER — Telehealth: Payer: Self-pay

## 2022-08-10 NOTE — Telephone Encounter (Signed)
Received telephone voice message from [facility] at Dr. Elissa Hefty office stating to hold off on placing AV graft for now. Spoke with patient's mother/Nathaniel [name]. Informed her of provider's decision. She verbalized understanding and reports patient is scheduled to follow up with Dr. Marval Regal on 08/30/22.

## 2022-09-08 ENCOUNTER — Encounter: Payer: Self-pay | Admitting: Endocrinology

## 2022-09-12 ENCOUNTER — Encounter (HOSPITAL_COMMUNITY): Payer: Self-pay | Admitting: Emergency Medicine

## 2022-09-12 ENCOUNTER — Other Ambulatory Visit: Payer: Self-pay

## 2022-09-12 ENCOUNTER — Emergency Department (HOSPITAL_COMMUNITY)
Admission: EM | Admit: 2022-09-12 | Discharge: 2022-09-12 | Disposition: A | Discharge status: Other Acute Inpatient Hospital | Payer: Medicare HMO | Attending: Emergency Medicine | Admitting: Emergency Medicine

## 2022-09-12 ENCOUNTER — Emergency Department (HOSPITAL_COMMUNITY): Payer: Medicare HMO

## 2022-09-12 DIAGNOSIS — M869 Osteomyelitis, unspecified: Secondary | ICD-10-CM | POA: Diagnosis not present

## 2022-09-12 DIAGNOSIS — D72829 Elevated white blood cell count, unspecified: Secondary | ICD-10-CM | POA: Diagnosis not present

## 2022-09-12 DIAGNOSIS — R000 Tachycardia, unspecified: Secondary | ICD-10-CM | POA: Diagnosis not present

## 2022-09-12 DIAGNOSIS — E1022 Type 1 diabetes mellitus with diabetic chronic kidney disease: Secondary | ICD-10-CM | POA: Diagnosis not present

## 2022-09-12 DIAGNOSIS — E1065 Type 1 diabetes mellitus with hyperglycemia: Secondary | ICD-10-CM | POA: Diagnosis not present

## 2022-09-12 DIAGNOSIS — Z7902 Long term (current) use of antithrombotics/antiplatelets: Secondary | ICD-10-CM | POA: Diagnosis not present

## 2022-09-12 DIAGNOSIS — X58XXXA Exposure to other specified factors, initial encounter: Secondary | ICD-10-CM | POA: Insufficient documentation

## 2022-09-12 DIAGNOSIS — E871 Hypo-osmolality and hyponatremia: Secondary | ICD-10-CM | POA: Insufficient documentation

## 2022-09-12 DIAGNOSIS — Z794 Long term (current) use of insulin: Secondary | ICD-10-CM | POA: Diagnosis not present

## 2022-09-12 DIAGNOSIS — I129 Hypertensive chronic kidney disease with stage 1 through stage 4 chronic kidney disease, or unspecified chronic kidney disease: Secondary | ICD-10-CM | POA: Insufficient documentation

## 2022-09-12 DIAGNOSIS — R6510 Systemic inflammatory response syndrome (SIRS) of non-infectious origin without acute organ dysfunction: Secondary | ICD-10-CM | POA: Diagnosis not present

## 2022-09-12 DIAGNOSIS — R944 Abnormal results of kidney function studies: Secondary | ICD-10-CM | POA: Insufficient documentation

## 2022-09-12 DIAGNOSIS — S3992XA Unspecified injury of lower back, initial encounter: Secondary | ICD-10-CM | POA: Diagnosis present

## 2022-09-12 DIAGNOSIS — S31819A Unspecified open wound of right buttock, initial encounter: Secondary | ICD-10-CM | POA: Diagnosis not present

## 2022-09-12 DIAGNOSIS — R700 Elevated erythrocyte sedimentation rate: Secondary | ICD-10-CM | POA: Diagnosis not present

## 2022-09-12 DIAGNOSIS — Z7982 Long term (current) use of aspirin: Secondary | ICD-10-CM | POA: Diagnosis not present

## 2022-09-12 DIAGNOSIS — Z79899 Other long term (current) drug therapy: Secondary | ICD-10-CM | POA: Insufficient documentation

## 2022-09-12 DIAGNOSIS — N184 Chronic kidney disease, stage 4 (severe): Secondary | ICD-10-CM | POA: Diagnosis not present

## 2022-09-12 LAB — COMPREHENSIVE METABOLIC PANEL
ALT: 13 U/L (ref 0–44)
AST: 16 U/L (ref 15–41)
Albumin: 2.5 g/dL — ABNORMAL LOW (ref 3.5–5.0)
Alkaline Phosphatase: 114 U/L (ref 38–126)
Anion gap: 15 (ref 5–15)
BUN: 94 mg/dL — ABNORMAL HIGH (ref 6–20)
CO2: 15 mmol/L — ABNORMAL LOW (ref 22–32)
Calcium: 9.4 mg/dL (ref 8.9–10.3)
Chloride: 96 mmol/L — ABNORMAL LOW (ref 98–111)
Creatinine, Ser: 4.6 mg/dL — ABNORMAL HIGH (ref 0.61–1.24)
GFR, Estimated: 16 mL/min — ABNORMAL LOW (ref >60)
Glucose, Bld: 243 mg/dL — ABNORMAL HIGH (ref 70–99)
Potassium: 4.8 mmol/L (ref 3.5–5.1)
Sodium: 126 mmol/L — ABNORMAL LOW (ref 135–145)
Total Bilirubin: 0.7 mg/dL (ref 0.3–1.2)
Total Protein: 8 g/dL (ref 6.5–8.1)

## 2022-09-12 LAB — CBC WITH DIFFERENTIAL/PLATELET
Abs Immature Granulocytes: 2.3 10*3/uL — ABNORMAL HIGH (ref 0.00–0.07)
Band Neutrophils: 38 %
Basophils Absolute: 0 10*3/uL (ref 0.0–0.1)
Basophils Relative: 0 %
Eosinophils Absolute: 0 10*3/uL (ref 0.0–0.5)
Eosinophils Relative: 0 %
HCT: 31.8 % — ABNORMAL LOW (ref 39.0–52.0)
Hemoglobin: 10.7 g/dL — ABNORMAL LOW (ref 13.0–17.0)
Lymphocytes Relative: 3 %
Lymphs Abs: 0.9 10*3/uL (ref 0.7–4.0)
MCH: 30.1 pg (ref 26.0–34.0)
MCHC: 33.6 g/dL (ref 30.0–36.0)
MCV: 89.6 fL (ref 80.0–100.0)
Metamyelocytes Relative: 7 %
Monocytes Absolute: 1.2 10*3/uL — ABNORMAL HIGH (ref 0.1–1.0)
Monocytes Relative: 4 %
Myelocytes: 1 %
Neutro Abs: 24.7 10*3/uL — ABNORMAL HIGH (ref 1.7–7.7)
Neutrophils Relative %: 47 %
Platelets: 408 10*3/uL — ABNORMAL HIGH (ref 150–400)
RBC: 3.55 MIL/uL — ABNORMAL LOW (ref 4.22–5.81)
RDW: 14.1 % (ref 11.5–15.5)
WBC: 29 10*3/uL — ABNORMAL HIGH (ref 4.0–10.5)
nRBC: 0 % (ref 0.0–0.2)

## 2022-09-12 LAB — PROTIME-INR
INR: 1.2 (ref 0.8–1.2)
Prothrombin Time: 15.1 seconds (ref 11.4–15.2)

## 2022-09-12 LAB — C-REACTIVE PROTEIN: CRP: 38.2 mg/dL — ABNORMAL HIGH (ref <1.0)

## 2022-09-12 LAB — CBG MONITORING, ED: Glucose-Capillary: 255 mg/dL — ABNORMAL HIGH (ref 70–99)

## 2022-09-12 LAB — LACTIC ACID, PLASMA
Lactic Acid, Venous: 3.2 mmol/L (ref 0.5–1.9)
Lactic Acid, Venous: 2.3 mmol/L (ref 0.5–1.9)

## 2022-09-12 LAB — APTT: aPTT: 24 seconds (ref 24–36)

## 2022-09-12 LAB — SEDIMENTATION RATE: Sed Rate: 127 mm/hr — ABNORMAL HIGH (ref 0–16)

## 2022-09-12 MED ORDER — PIPERACILLIN-TAZOBACTAM IN DEX 2-0.25 GM/50ML IV SOLN
2.2500 g | Freq: Three times a day (TID) | INTRAVENOUS | Status: DC
  Administered 2022-09-12: 2.25 g via INTRAVENOUS
  Filled 2022-09-12 (×3): qty 50

## 2022-09-12 MED ORDER — LACTATED RINGERS IV SOLN: INTRAVENOUS | Status: DC

## 2022-09-12 MED ORDER — LACTATED RINGERS IV BOLUS
2000.0000 mL | Freq: Once | INTRAVENOUS | Status: AC
  Administered 2022-09-12: 2000 mL via INTRAVENOUS

## 2022-09-12 MED ORDER — CLINDAMYCIN HCL 150 MG PO CAPS
600.0000 mg | ORAL_CAPSULE | Freq: Once | ORAL | Status: AC
  Administered 2022-09-12: 600 mg via ORAL
  Filled 2022-09-12: qty 4

## 2022-09-12 MED ORDER — PIPERACILLIN-TAZOBACTAM 3.375 G IVPB
3.3750 g | Freq: Once | INTRAVENOUS | Status: DC
  Filled 2022-09-12: qty 50

## 2022-09-12 MED ORDER — VANCOMYCIN VARIABLE DOSE PER UNSTABLE RENAL FUNCTION (PHARMACIST DOSING): Status: DC

## 2022-09-12 MED ORDER — VANCOMYCIN HCL 1250 MG/250ML IV SOLN
1250.0000 mg | Freq: Once | INTRAVENOUS | Status: AC
  Administered 2022-09-12: 1250 mg via INTRAVENOUS
  Filled 2022-09-12: qty 250

## 2022-09-12 NOTE — ED Notes (Signed)
Pt given a urinal and informed a urine sample is needed at this time.  

## 2022-09-12 NOTE — ED Notes (Signed)
Date and time results received: 09/12/22 1805  Test: lactic acid Critical Value: 2.3  Name of Provider Notified: Theressa Stamps PA  Orders Received? Or Actions Taken?: Orders Received - See Orders for details

## 2022-09-12 NOTE — Progress Notes (Signed)
Notified bedside nurse of need to draw repeat lactic acid. 

## 2022-09-12 NOTE — ED Notes (Signed)
Attempted to call charge nurse at [facility] several times- line is busy

## 2022-09-12 NOTE — ED Triage Notes (Signed)
Patient has wound to right hip that started off as abscess. First appeared x5 days ago and is progressively getting worse. Per mother abscess has been draining. Denies any fevers. HR is elevated,

## 2022-09-12 NOTE — Progress Notes (Signed)
Pharmacy Antibiotic Note  Nathaniel [name] is a 36 y.o. male admitted on 09/12/2022 with cellulitis.  Pharmacy has been consulted for Vancomycin and Zosyn dosing. WBC elevated at 29K, patient afebrile. Scr 4.60 elevated from last Scr of 4.30 in 01/2022, possibly new baseline of CKDIV but potentially AKI on CKD.   Plan: Vancomycin 1250 mg IV x 1 Obtain vancomycin random level to assess for re-dosing of Vancomycin  Zosyn 2.25 g IV q8h  Monitor renal function, cultures, clinical improvement  Height: 5\' 11"  (180.3 cm) Weight: 62.1 kg (137 lb) IBW/kg (Calculated) : 75.3  Temp (24hrs), Avg:98 F (36.7 C), Min:98 F (36.7 C), Max:98 F (36.7 C)  Recent Labs  Lab 09/12/22 1539  WBC 29.0*  CREATININE 4.60*  LATICACIDVEN 3.2*    Estimated Creatinine Clearance: 19.5 mL/min (A) (by C-G formula based on SCr of 4.6 mg/dL (H)).    Allergies  Allergen Reactions   Fructose Other (See Comments)    Increase of blood sugar     Antimicrobials this admission: Vancomycin 12/24 >>  Zosyn 12/24 >>   Microbiology results: 12/24 BCx: pending  Thank you for allowing pharmacy to be a part of this patient's care.  Elsie Amis 09/12/2022 4:35 PM

## 2022-09-12 NOTE — ED Notes (Signed)
AC called to bring zosyn

## 2022-09-12 NOTE — ED Provider Notes (Signed)
5:51 PM I discussed with [facility] transfer line. Dr. Neldon Mc is asking for emergency general surgery to be contacted. Will get them on line and proceed from there.  6:12 PM I discussed with [facility], Dr. Kathi Simpers.  He accepts to [facility] emergency department in transfer.  Patient's vital signs seemingly have improved and his tachycardia is only mild.  He is not hypotensive.  He does have a lactic acidosis and was given IV fluids.  He was given broad antibiotics including clindamycin, Zosyn, vancomycin.  While he is ill and will need surgery, he is stable for transfer.  CRITICAL CARE Performed by: Ephraim Hamburger   Total critical care time: 40 minutes  Critical care time was exclusive of separately billable procedures and treating other patients.  Critical care was necessary to treat or prevent imminent or life-threatening deterioration.  Critical care was time spent personally by me on the following activities: development of treatment plan with patient and/or surrogate as well as nursing, discussions with consultants, evaluation of patient's response to treatment, examination of patient, obtaining history from patient or surrogate, ordering and performing treatments and interventions, ordering and review of laboratory studies, ordering and review of radiographic studies, pulse oximetry and re-evaluation of patient's condition.    Sherwood Gambler, MD 09/12/22 1949

## 2022-09-12 NOTE — Consult Note (Addendum)
[facility] Consult  Reason for Consult: Necrotizing soft tissue infection of the right buttock/hip Referring Physician: Theressa Stamps, PA  Chief Complaint   Wound Infection     HPI: Nathaniel [name] is a 36 y.o. male who presents with worsening right hip wound.  He has had a wound present for about a week, and it has been causing him significant pain.  He also developed an overlying wound.  He does note some drainage, though he is unable to describe the characteristics of the drainage.  His mom wanted him to present to the hospital sooner this week, but he finally agreed to present to the hospital today.  He denies any fevers or chills at home.  His blood sugars have been very poorly controlled this week.  His past medical history is significant for diabetes, hypertension, hyperlipidemia, and chronic kidney disease not requiring HD at this time but with plans for requiring HD in the future.  His surgical history is significant for a stage brachiobasilic fistula creation in April 2022.  In the ED, he was noted to be tachycardic.  He underwent a right hip x-ray which demonstrated extensive soft tissue swelling and subcutaneous gas overlying the right hip and osteomyelitis of the greater trochanter of the proximal right femur.  WBC 29, lactic acid 3.2, hyponatremic with sodium of 126, slightly worse kidney function, creatinine 4.6 from baseline of 4.3, and blood sugars in the 200s.  Past Medical History:  Diagnosis Date   Diabetes mellitus    lantus/novolog   Hyperlipidemia    Hypertension    Marijuana abuse    occaisionally   Noncompliance with medication regimen    Stroke ([place]) 2020   Tobacco abuse    5/day    Past Surgical History:  Procedure Laterality Date   AV FISTULA PLACEMENT Left 01/08/2021   Procedure: LEFT ARM ARTERIOVENOUS (AV) FISTULA CREATION;  Surgeon: [name], MD;  Location: [facility];  Service: Vascular;  Laterality: Left;   EYE SURGERY       Family History  Problem Relation Age of Onset   Diabetes Father    Kidney failure Father        last 31 mnths of life   Hypertension Mother    Diabetes Maternal Grandmother    Pancreatic cancer Maternal Grandfather    Diabetes Paternal Grandfather     Social History   Tobacco Use   Smoking status: Every Day    Packs/day: 0.50    Years: 8.00    Total pack years: 4.00    Types: Cigarettes   Smokeless tobacco: Never   Tobacco comments:    smoke 5-8 per day  Vaping Use   Vaping Use: Never used  Substance Use Topics   Alcohol use: No   Drug use: Yes    Types: Marijuana    Comment: last use yesterday, every other days     Medications: I have reviewed the patient's current medications.  Allergies  Allergen Reactions   Fructose Other (See Comments)    Increase of blood sugar      ROS:  Pertinent items are noted in HPI.  Blood pressure 127/82, pulse (!) 110, temperature 98 F (36.7 C), temperature source Oral, resp. rate 18, height 5\' 11"  (1.803 m), weight 62.1 kg, SpO2 100 %. Physical Exam Vitals reviewed.  Constitutional:      Appearance: Normal appearance. He is ill-appearing.  HENT:     Head: Normocephalic and atraumatic.  Cardiovascular:  Rate and Rhythm: Tachycardia present.  Abdominal:     General: There is no distension.     Palpations: Abdomen is soft.     Tenderness: There is no abdominal tenderness.  Skin:    Comments: Right hip with 2 cm circular eschar without overt drainage, significant erythema and edema of the overlying skin, palpable crepitance consistent with subcutaneous emphysema  Neurological:     General: No focal deficit present.     Mental Status: He is alert.  Psychiatric:        Mood and Affect: Mood normal.        Behavior: Behavior normal.     Results: Results for orders placed or performed during the hospital encounter of 09/12/22 (from the past 48 hour(s))  POC CBG, ED     Status: Abnormal   Collection Time: 09/12/22   2:55 PM  Result Value Ref Range   Glucose-Capillary 255 (H) 70 - 99 mg/dL    Comment: Glucose reference range applies only to samples taken after fasting for at least 8 hours.  Lactic acid, plasma     Status: Abnormal   Collection Time: 09/12/22  3:39 PM  Result Value Ref Range   Lactic Acid, Venous 3.2 (HH) 0.5 - 1.9 mmol/L    Comment: CRITICAL RESULT CALLED TO, READ BACK BY AND VERIFIED WITH: THOMPSON R. AT 1613 ON [id number] BY THOMPSON S. Performed at [facility], [address], [place], [place] [address]   Comprehensive metabolic panel     Status: Abnormal   Collection Time: 09/12/22  3:39 PM  Result Value Ref Range   Sodium 126 (L) 135 - 145 mmol/L   Potassium 4.8 3.5 - 5.1 mmol/L   Chloride 96 (L) 98 - 111 mmol/L   CO2 15 (L) 22 - 32 mmol/L   Glucose, Bld 243 (H) 70 - 99 mg/dL    Comment: Glucose reference range applies only to samples taken after fasting for at least 8 hours.   BUN 94 (H) 6 - 20 mg/dL    Comment: RESULTS CONFIRMED BY MANUAL DILUTION   Creatinine, Ser 4.60 (H) 0.61 - 1.24 mg/dL   Calcium 9.4 8.9 - 10.3 mg/dL   Total Protein 8.0 6.5 - 8.1 g/dL   Albumin 2.5 (L) 3.5 - 5.0 g/dL   AST 16 15 - 41 U/L   ALT 13 0 - 44 U/L   Alkaline Phosphatase 114 38 - 126 U/L   Total Bilirubin 0.7 0.3 - 1.2 mg/dL   GFR, Estimated 16 (L) >60 mL/min    Comment: (NOTE) Calculated using the CKD-EPI Creatinine Equation (2021)    Anion gap 15 5 - 15    Comment: Performed at [facility], [address], [place], [place] [address]  CBC with Differential     Status: Abnormal   Collection Time: 09/12/22  3:39 PM  Result Value Ref Range   WBC 29.0 (H) 4.0 - 10.5 K/uL   RBC 3.55 (L) 4.22 - 5.81 MIL/uL   Hemoglobin 10.7 (L) 13.0 - 17.0 g/dL   HCT 31.8 (L) 39.0 - 52.0 %   MCV 89.6 80.0 - 100.0 fL   MCH 30.1 26.0 - 34.0 pg   MCHC 33.6 30.0 - 36.0 g/dL   RDW 14.1 11.5 - 15.5 %   Platelets 408 (H) 150 - 400 K/uL   nRBC 0.0 0.0 - 0.2 %   Neutrophils Relative % 47 %   Neutro Abs 24.7  (H) 1.7 - 7.7 K/uL   Band Neutrophils  38 %   Lymphocytes Relative 3 %   Lymphs Abs 0.9 0.7 - 4.0 K/uL   Monocytes Relative 4 %   Monocytes Absolute 1.2 (H) 0.1 - 1.0 K/uL   Eosinophils Relative 0 %   Eosinophils Absolute 0.0 0.0 - 0.5 K/uL   Basophils Relative 0 %   Basophils Absolute 0.0 0.0 - 0.1 K/uL   WBC Morphology DOHLE BODIES     Comment: INCREASED BANDS (>20% BANDS) MILD LEFT SHIFT (1-5% METAS, OCC MYELO, OCC BANDS) VACUOLATED NEUTROPHILS    RBC Morphology MORPHOLOGY UNREMARKABLE    Smear Review MORPHOLOGY UNREMARKABLE    Metamyelocytes Relative 7 %   Myelocytes 1 %   Abs Immature Granulocytes 2.30 (H) 0.00 - 0.07 K/uL    Comment: Performed at [facility], [address], [place], [place] [address]  Protime-INR     Status: None   Collection Time: 09/12/22  3:52 PM  Result Value Ref Range   Prothrombin Time 15.1 11.4 - 15.2 seconds   INR 1.2 0.8 - 1.2    Comment: (NOTE) INR goal varies based on device and disease states. Performed at [facility], [address], [place], [place] [address]   APTT     Status: None   Collection Time: 09/12/22  3:52 PM  Result Value Ref Range   aPTT 24 24 - 36 seconds    Comment: Performed at [facility], [address], [place], [place] [address]  Blood Culture (routine x 2)     Status: None (Preliminary result)   Collection Time: 09/12/22  3:52 PM   Specimen: Left Antecubital; Blood  Result Value Ref Range   Specimen Description      LEFT ANTECUBITAL BOTTLES DRAWN AEROBIC AND ANAEROBIC   Special Requests      Blood Culture results may not be optimal due to an excessive volume of blood received in culture bottles Performed at [facility], [address] [place], [place] [address]    Culture PENDING    Report Status PENDING   Blood Culture (routine x 2)     Status: None (Preliminary result)   Collection Time: 09/12/22  3:52 PM   Specimen: Right Antecubital; Blood  Result Value Ref Range   Specimen Description      RIGHT  ANTECUBITAL BOTTLES DRAWN AEROBIC AND ANAEROBIC   Special Requests      Blood Culture results may not be optimal due to an excessive volume of blood received in culture bottles Performed at [facility], [address] [place],  [address]    Culture PENDING    Report Status PENDING     DG Hip Unilat W or Wo Pelvis 2-3 Views Right  Result Date: 09/12/2022 CLINICAL DATA:  Progressive right hip draining abscess, right hip wound, tachycardia EXAM: DG HIP (WITH OR WITHOUT PELVIS) 2-3V RIGHT COMPARISON:  None Available. FINDINGS: Frontal view of the pelvis as well as frontal and frogleg lateral views of the right hip are obtained. There is soft tissue swelling and extensive subcutaneous gas overlying the right hip, consistent with history of draining abscess. Erosive changes are seen within the greater trochanter of the right hip consistent with underlying osteomyelitis. There are no acute displaced fractures. Joint spaces are well preserved. Sacroiliac joints are normal. IMPRESSION: 1. Extensive soft tissue swelling and subcutaneous gas overlying the right hip, consistent with history of right hip wound and draining abscess. Gas-forming infection could also be considered. 2. Osteomyelitis of the greater trochanter proximal right femur. Electronically Signed  By: Randa Ngo M.D.   On: 09/12/2022 15:45     Assessment & Plan:  PRABHJOT PISCITELLO is a 36 y.o. male who presents with a 1 week history of worsening right hip wound and pain.  X-ray demonstrating significant soft tissue swelling with subcutaneous gas overlying the right hip, and osteomyelitis of the greater trochanter.  WBC 29, LA 3.2, Na 126, Cr 4.6.  Imaging and blood work evaluated by myself.  -I explained to the patient that he likely has a necrotizing soft tissue infection that will require operative debridement.  I further explained that this is involving his hip bone, and for this reason, it would require a more extensive  surgery than can be performed at [place] [facility] -Given the involvement of the right femur, I advised the ED that this patient would need to be transferred for evaluation by orthopedic surgery -Orthopedic surgery at [facility] was contacted, and they recommended transfer to [facility], as the patient may require a right hemipelvectomy pending the amount of bone and joint involvement -Call placed to [facility] by ED physician -Recommend IV Zosyn, vancomycin, and clindamycin -Continue with IV fluid resuscitation and recommend close hemodynamic monitoring  All questions were answered to the satisfaction of the patient and family.  -- Graciella Freer, DO [facility] [address] [employer] [place], [place] [address] [phone] (office)

## 2022-09-12 NOTE — ED Notes (Signed)
CT called to request CD be made of pt scan results.

## 2022-09-12 NOTE — ED Notes (Signed)
Date and time results received: 09/12/22 0413  Test: Lactic Critical Value: 3.2  Name of Provider Notified: Verta Ellen  Orders Received? Or Actions Taken?: NA

## 2022-09-12 NOTE — ED Provider Notes (Cosign Needed Addendum)
[facility] EMERGENCY DEPARTMENT Provider Note   CSN: [id number] Arrival date & time: 09/12/22  1409     History  Chief Complaint  Patient presents with   Wound Infection    Nathaniel [name] is a 36 y.o. male with history significant for type 1 diabetes, CVA, stage IV CKD, HTN, medication non compliance presents to the ED with complaint of a wound to his right hip and buttock that started off as an abscess/boil.  Patient states it first appeared approximately 6 days ago and is progressively getting worse.  Per mother, the abscess has drained some but has not in the last 24 hours.  Mother states patient has had poorly controlled blood sugars at home over the last week and has had blood sugars in the 400s.  Patient does spend a lot of time laying or sitting in bed.  He has also had poor appetite and intake over the last couple of days.  Patient reports feeling very weak.  Denies fever, chills, chest pain, shortness of breath, nausea, vomiting, diarrhea, lightheadedness, dizziness.      Home Medications Prior to Admission medications   Medication Sig Start Date End Date Taking? Authorizing Provider  acetaminophen (TYLENOL) 325 MG tablet Take 1-2 tablets (325-650 mg total) by mouth every 4 (four) hours as needed for mild pain. 03/07/18   Love, Ivan Anchors, PA-C  amLODipine (NORVASC) 10 MG tablet Take 1 tablet (10 mg total) by mouth daily. For blood pressure 07/16/20   Emokpae, Courage, MD  ASPIRIN LOW DOSE 81 MG EC tablet Take 81 mg by mouth every morning. 10/21/21   [provider]  atorvastatin (LIPITOR) 80 MG tablet Take 1 tablet (80 mg total) by mouth every evening. For stroke prevention 07/16/20   Roxan Hockey, MD  clopidogrel (PLAVIX) 75 MG tablet Take 1 tablet (75 mg total) by mouth daily. For stroke prevention 07/16/20   Roxan Hockey, MD  Continuous Blood Gluc Sensor (DEXCOM G6 SENSOR) MISC 1 each by Does not apply route every 14 (fourteen) days. 07/21/21   Elayne Snare,  MD  Continuous Blood Gluc Transmit (DEXCOM G6 TRANSMITTER) MISC Check sugar 4 times a day 07/21/21   Elayne Snare, MD  DULoxetine (CYMBALTA) 20 MG capsule Take 1 capsule (20 mg total) by mouth daily. Patient not taking: Reported on 01/20/2022 07/16/20 07/16/21  Roxan Hockey, MD  insulin aspart (NOVOLOG) 100 UNIT/ML FlexPen Inject per sliding scale before mealsI: Blood sugars between 200-250 Take 2 units, 250-300 take 3 units, Over 300 Take 4 units 04/23/22   Elayne Snare, MD  Insulin Disposable Pump (OMNIPOD 5 G6 POD, GEN 5,) MISC 1 Device by Does not apply route every 3 (three) days. 05/26/22   Elayne Snare, MD  Insulin Pen Needle (PEN NEEDLES 3/16") 31G X 5 MM MISC Four times day 07/16/20   Roxan Hockey, MD  labetalol (NORMODYNE) 100 MG tablet Take 1 tablet (100 mg total) by mouth 2 (two) times daily. For BP 07/16/20   Emokpae, Courage, MD  lisinopril (ZESTRIL) 40 MG tablet Take 1 tablet (40 mg total) by mouth daily. For blood pressure 07/16/20   Roxan Hockey, MD  [facility] ULTRA test strip USE TO TEST BLOOD SUGAR 4 TIMES A DAY 11/19/21   Elayne Snare, MD  TRESIBA FLEXTOUCH 100 UNIT/ML FlexTouch Pen INJECT UP TO 10 UNITS ONCE DAILY AS DIRECTED - PLUS UP TO 5 UNITS IF BLOOD SUGAR IS ABOVE 200 - PER SLIDING SCALE PROVIDED BY PHYSICIAN 04/21/22   Elayne Snare, MD  Vitamin D, Ergocalciferol, (DRISDOL) 1.25 MG (50000 UNIT) CAPS capsule Take 1 capsule (50,000 Units total) by mouth once a week. 07/16/20   Roxan Hockey, MD      Allergies    Fructose    Review of Systems   Review of Systems  Constitutional:  Positive for appetite change and fatigue. Negative for chills and fever.  Respiratory:  Negative for shortness of breath.   Cardiovascular:  Negative for chest pain.  Gastrointestinal:  Negative for diarrhea, nausea and vomiting.  Skin:  Positive for wound.  Neurological:  Positive for weakness. Negative for dizziness and light-headedness.    Physical Exam Updated Vital Signs BP 127/82    Pulse (!) 110   Temp 98 F (36.7 C) (Oral)   Resp 18   Ht 5\' 11"  (1.803 m)   Wt 62.1 kg   SpO2 100%   BMI 19.11 kg/m  Physical Exam Vitals and nursing note reviewed.  Constitutional:      General: He is not in acute distress.    Appearance: He is ill-appearing. He is not toxic-appearing or diaphoretic.  HENT:     Mouth/Throat:     Mouth: Mucous membranes are moist.     Pharynx: Oropharynx is clear.  Cardiovascular:     Rate and Rhythm: Regular rhythm. Tachycardia present.     Pulses: Normal pulses.     Heart sounds: Normal heart sounds.  Pulmonary:     Effort: Pulmonary effort is normal. No respiratory distress.     Breath sounds: Normal breath sounds and air entry.  Abdominal:     General: Abdomen is flat. Bowel sounds are normal. There is no distension.     Palpations: Abdomen is soft.     Tenderness: There is no abdominal tenderness.  Skin:    General: Skin is warm and moist.     Capillary Refill: Capillary refill takes less than 2 seconds.     Coloration: Skin is not pale.     Findings: Wound present.     Comments: Large wound to right buttock with TTP, necrotic center, no fluctuance, no active drainage  Neurological:     Mental Status: He is alert. Mental status is at baseline.  Psychiatric:        Mood and Affect: Mood normal.        Behavior: Behavior normal.     ED Results / Procedures / Treatments   Labs (all labs ordered are listed, but only abnormal results are displayed) Labs Reviewed  LACTIC ACID, PLASMA - Abnormal; Notable for the following components:      Result Value   Lactic Acid, Venous 3.2 (*)    All other components within normal limits  COMPREHENSIVE METABOLIC PANEL - Abnormal; Notable for the following components:   Sodium 126 (*)    Chloride 96 (*)    CO2 15 (*)    Glucose, Bld 243 (*)    BUN 94 (*)    Creatinine, Ser 4.60 (*)    Albumin 2.5 (*)    GFR, Estimated 16 (*)    All other components within normal limits  CBC WITH  DIFFERENTIAL/PLATELET - Abnormal; Notable for the following components:   WBC 29.0 (*)    RBC 3.55 (*)    Hemoglobin 10.7 (*)    HCT 31.8 (*)    Platelets 408 (*)    Neutro Abs 24.7 (*)    Monocytes Absolute 1.2 (*)    Abs Immature Granulocytes 2.30 (*)    All other  components within normal limits  CBG MONITORING, ED - Abnormal; Notable for the following components:   Glucose-Capillary 255 (*)    All other components within normal limits  CULTURE, BLOOD (ROUTINE X 2)  CULTURE, BLOOD (ROUTINE X 2)  PROTIME-INR  APTT  LACTIC ACID, PLASMA  URINALYSIS, ROUTINE W REFLEX MICROSCOPIC  SEDIMENTATION RATE  C-REACTIVE PROTEIN    EKG EKG Interpretation  Date/Time:  Sunday September 12 2022 15:58:03 EST Ventricular Rate:  113 PR Interval:  137 QRS Duration: 80 QT Interval:  289 QTC Calculation: 397 R Axis:   76 Text Interpretation: Sinus tachycardia Consider left ventricular hypertrophy ST elev, probable normal early repol pattern HR improved Confirmed by Sherwood Gambler [phone]) on 09/12/2022 4:13:39 PM  Radiology DG Hip Unilat W or Wo Pelvis 2-3 Views Right  Result Date: 09/12/2022 CLINICAL DATA:  Progressive right hip draining abscess, right hip wound, tachycardia EXAM: DG HIP (WITH OR WITHOUT PELVIS) 2-3V RIGHT COMPARISON:  None Available. FINDINGS: Frontal view of the pelvis as well as frontal and frogleg lateral views of the right hip are obtained. There is soft tissue swelling and extensive subcutaneous gas overlying the right hip, consistent with history of draining abscess. Erosive changes are seen within the greater trochanter of the right hip consistent with underlying osteomyelitis. There are no acute displaced fractures. Joint spaces are well preserved. Sacroiliac joints are normal. IMPRESSION: 1. Extensive soft tissue swelling and subcutaneous gas overlying the right hip, consistent with history of right hip wound and draining abscess. Gas-forming infection could also be  considered. 2. Osteomyelitis of the greater trochanter proximal right femur. Electronically Signed   By: Randa Ngo M.D.   On: 09/12/2022 15:45    Procedures .Critical Care  Performed by: Pat Kocher, PA Authorized by: Pat Kocher, PA   Critical care provider statement:    Critical care time (minutes):  35   Critical care was necessary to treat or prevent imminent or life-threatening deterioration of the following conditions:  Sepsis   Critical care was time spent personally by me on the following activities:  Development of treatment plan with patient or surrogate, discussions with consultants, evaluation of patient's response to treatment, examination of patient, ordering and review of laboratory studies, ordering and review of radiographic studies, ordering and performing treatments and interventions, pulse oximetry, re-evaluation of patient's condition, review of old charts and obtaining history from patient or surrogate   Care discussed with: admitting provider and accepting provider at another facility       Medications Ordered in ED Medications  lactated ringers bolus 2,000 mL (has no administration in time range)  vancomycin (VANCOREADY) IVPB 1250 mg/250 mL (has no administration in time range)  piperacillin-tazobactam (ZOSYN) IVPB 2.25 g (has no administration in time range)  vancomycin variable dose per unstable renal function (pharmacist dosing) (has no administration in time range)  clindamycin (CLEOCIN) capsule 600 mg (600 mg Oral Given 09/12/22 1602)    ED Course/ Medical Decision Making/ A&P                           Medical Decision Making Amount and/or Complexity of Data Reviewed Labs: ordered. Radiology: ordered.  Risk Prescription drug management.   This patient presents to the ED with chief complaint(s) of wound with pertinent past medical history of poorly controlled type 1 diabetes, CKD, HTN, medication noncompliance, SIRS which further  complicates the presenting complaint. The complaint involves an extensive differential diagnosis and treatment  options and also carries with it a high risk of complications and morbidity.    The differential diagnosis includes necrotizing soft tissue infection, osteomyelitis, sepsis, skin abscess, cellulitis, decubitus ulcer   The initial plan is to order sepsis workup and pelvic x-ray and to treat patient with LR fluid bolus and IV antibiotics for sepsis  Additional history obtained: Additional history obtained from family, mother at bedside, she is legal guardian  Records reviewed  endocrinology notes  Initial Assessment: On exam, patient is ill-appearing, but non-toxic, non diaphoretic.  His skin is clammy, but he is not pale, has no rashes or jaundice.  Heart rate is tachycardic with regular rhythm.  Lungs clear to auscultation bilaterally.  There is a large wound to the right buttock with necrotic center, skin breakdown, and tenderness to palpation (photo included in physical exam).  No appreciable crepitus.    Reassessment and review (also see workup area): Lab Tests: I ordered and personally interpreted labs.  The pertinent results include:   Initial lactic acid 3.2, repeat lactic 2.3 WBC 29.0 Cr 4.60, BUN 94, GFR 16 Sed rate 127 CBC also demonstrates hemoglobin of 10.7 which appears to be patient's baseline.  He does have elevated platelets at 408.  Neutrophilia is present. Metabolic panel also significant for hyponatremia with a sodium of 126, hyperglycemia with a glucose of 243. APTT, PT INR are all within normal range. Blood cultures were collected and results pending. CRP and sed rate also collected and results pending. UA ordered.  ECG reveals sinus tachycardia.   Imaging Studies: I ordered and independently visualized and interpreted the following imaging X-ray right hip and pelvis   which showed gas forming infection with osteomyelitis of the greater trochanter of right  femur The interpretation of the imaging was limited to assessing for emergent pathology, for which purpose it was ordered.  Consultations Obtained: I requested consultation with the consultant Dr. Kathaleen Bury , and discussed  findings as well as pertinent plan - they recommend: transferring patient to [facility] for surgical care and hospitalization due to severity of patient condition and patient co-morbidities   Medicines ordered and prescription drug management: I ordered the following medications Zosyn, clindamycin, and vancomycin for broad spectrum sepsis antibiotics     Reevaluation of the patient after these medicines showed that the patient    stayed the same  Cardiac Monitoring: The patient was maintained on a cardiac monitor.  I personally viewed and interpreted the cardiac monitor which showed an underlying rhythm of:  sinus tachycardia  Complexity of problems addressed: Patient's presentation is most consistent with  acute presentation with potential threat to life or bodily function  Disposition: After consideration of the diagnostic results and the patient's response to treatment, I feel that the patent would benefit from admission and orthopedic surgery at [facility] per orthopedic surgeon, Dr. Wandalee Ferdinand recommendation .  Discussed plan of transfer with patient and his mother who is legal guardian who are both in agreement.   Discussed HPI, physical exam findings, assessment and plan with attending Sherwood Gambler.  The attending physician assessed patient independently as part of a shared visit.         Final Clinical Impression(s) / ED Diagnoses Final diagnoses:  SIRS (systemic inflammatory response syndrome) (HCC)  Osteomyelitis of right femur, unspecified type ([place])    Rx / DC Orders ED Discharge Orders     None         Bayleigh Loflin R, PA  09/12/22 1831    Theressa Stamps R, PA 09/12/22 1832     Sherwood Gambler, MD 09/13/22 [phone]

## 2022-09-12 NOTE — Progress Notes (Signed)
Elink following code sepsis °

## 2022-09-17 LAB — CULTURE, BLOOD (ROUTINE X 2)
Culture: NO GROWTH
Culture: NO GROWTH

## 2022-09-27 ENCOUNTER — Ambulatory Visit: Payer: Medicare HMO | Admitting: Endocrinology

## 2022-09-28 DIAGNOSIS — R531 Weakness: Secondary | ICD-10-CM | POA: Diagnosis not present

## 2022-09-28 DIAGNOSIS — Z743 Need for continuous supervision: Secondary | ICD-10-CM | POA: Diagnosis not present

## 2022-10-12 DIAGNOSIS — E109 Type 1 diabetes mellitus without complications: Secondary | ICD-10-CM | POA: Diagnosis not present

## 2022-10-18 ENCOUNTER — Inpatient Hospital Stay (HOSPITAL_COMMUNITY): Payer: Medicare HMO

## 2022-10-18 ENCOUNTER — Emergency Department (HOSPITAL_COMMUNITY): Payer: Medicare HMO

## 2022-10-18 ENCOUNTER — Inpatient Hospital Stay (HOSPITAL_COMMUNITY)
Admission: EM | Admit: 2022-10-18 | Discharge: 2022-10-25 | DRG: 871 | Disposition: A | Discharge status: Home Health | Payer: Medicare HMO | Attending: Family Medicine | Admitting: Family Medicine

## 2022-10-18 DIAGNOSIS — N183 Chronic kidney disease, stage 3 unspecified: Secondary | ICD-10-CM | POA: Diagnosis not present

## 2022-10-18 DIAGNOSIS — Z79899 Other long term (current) drug therapy: Secondary | ICD-10-CM

## 2022-10-18 DIAGNOSIS — Z7982 Long term (current) use of aspirin: Secondary | ICD-10-CM

## 2022-10-18 DIAGNOSIS — J9601 Acute respiratory failure with hypoxia: Secondary | ICD-10-CM | POA: Diagnosis present

## 2022-10-18 DIAGNOSIS — Z8249 Family history of ischemic heart disease and other diseases of the circulatory system: Secondary | ICD-10-CM

## 2022-10-18 DIAGNOSIS — K3184 Gastroparesis: Secondary | ICD-10-CM | POA: Diagnosis present

## 2022-10-18 DIAGNOSIS — J9600 Acute respiratory failure, unspecified whether with hypoxia or hypercapnia: Secondary | ICD-10-CM | POA: Insufficient documentation

## 2022-10-18 DIAGNOSIS — Z833 Family history of diabetes mellitus: Secondary | ICD-10-CM

## 2022-10-18 DIAGNOSIS — N179 Acute kidney failure, unspecified: Secondary | ICD-10-CM | POA: Diagnosis present

## 2022-10-18 DIAGNOSIS — E782 Mixed hyperlipidemia: Secondary | ICD-10-CM | POA: Diagnosis present

## 2022-10-18 DIAGNOSIS — R680 Hypothermia, not associated with low environmental temperature: Secondary | ICD-10-CM | POA: Diagnosis present

## 2022-10-18 DIAGNOSIS — L0231 Cutaneous abscess of buttock: Secondary | ICD-10-CM | POA: Diagnosis present

## 2022-10-18 DIAGNOSIS — R4182 Altered mental status, unspecified: Secondary | ICD-10-CM | POA: Diagnosis not present

## 2022-10-18 DIAGNOSIS — Z9102 Food additives allergy status: Secondary | ICD-10-CM

## 2022-10-18 DIAGNOSIS — K7290 Hepatic failure, unspecified without coma: Secondary | ICD-10-CM | POA: Diagnosis present

## 2022-10-18 DIAGNOSIS — E860 Dehydration: Secondary | ICD-10-CM | POA: Diagnosis present

## 2022-10-18 DIAGNOSIS — E108 Type 1 diabetes mellitus with unspecified complications: Secondary | ICD-10-CM | POA: Diagnosis present

## 2022-10-18 DIAGNOSIS — E1122 Type 2 diabetes mellitus with diabetic chronic kidney disease: Secondary | ICD-10-CM | POA: Diagnosis not present

## 2022-10-18 DIAGNOSIS — R6521 Severe sepsis with septic shock: Secondary | ICD-10-CM | POA: Diagnosis present

## 2022-10-18 DIAGNOSIS — R739 Hyperglycemia, unspecified: Secondary | ICD-10-CM | POA: Diagnosis not present

## 2022-10-18 DIAGNOSIS — I639 Cerebral infarction, unspecified: Secondary | ICD-10-CM | POA: Diagnosis present

## 2022-10-18 DIAGNOSIS — I499 Cardiac arrhythmia, unspecified: Secondary | ICD-10-CM | POA: Diagnosis not present

## 2022-10-18 DIAGNOSIS — E103591 Type 1 diabetes mellitus with proliferative diabetic retinopathy without macular edema, right eye: Secondary | ICD-10-CM | POA: Diagnosis present

## 2022-10-18 DIAGNOSIS — A419 Sepsis, unspecified organism: Principal | ICD-10-CM | POA: Diagnosis present

## 2022-10-18 DIAGNOSIS — I469 Cardiac arrest, cause unspecified: Secondary | ICD-10-CM | POA: Insufficient documentation

## 2022-10-18 DIAGNOSIS — Z794 Long term (current) use of insulin: Secondary | ICD-10-CM | POA: Diagnosis not present

## 2022-10-18 DIAGNOSIS — E873 Alkalosis: Secondary | ICD-10-CM | POA: Diagnosis present

## 2022-10-18 DIAGNOSIS — E875 Hyperkalemia: Secondary | ICD-10-CM | POA: Diagnosis present

## 2022-10-18 DIAGNOSIS — Z743 Need for continuous supervision: Secondary | ICD-10-CM | POA: Diagnosis not present

## 2022-10-18 DIAGNOSIS — D539 Nutritional anemia, unspecified: Secondary | ICD-10-CM | POA: Diagnosis present

## 2022-10-18 DIAGNOSIS — I4901 Ventricular fibrillation: Secondary | ICD-10-CM | POA: Diagnosis present

## 2022-10-18 DIAGNOSIS — L89153 Pressure ulcer of sacral region, stage 3: Secondary | ICD-10-CM | POA: Diagnosis present

## 2022-10-18 DIAGNOSIS — F1210 Cannabis abuse, uncomplicated: Secondary | ICD-10-CM | POA: Diagnosis present

## 2022-10-18 DIAGNOSIS — E1110 Type 2 diabetes mellitus with ketoacidosis without coma: Secondary | ICD-10-CM | POA: Diagnosis present

## 2022-10-18 DIAGNOSIS — E1043 Type 1 diabetes mellitus with diabetic autonomic (poly)neuropathy: Secondary | ICD-10-CM | POA: Diagnosis present

## 2022-10-18 DIAGNOSIS — N1832 Chronic kidney disease, stage 3b: Secondary | ICD-10-CM | POA: Diagnosis present

## 2022-10-18 DIAGNOSIS — I462 Cardiac arrest due to underlying cardiac condition: Secondary | ICD-10-CM | POA: Diagnosis present

## 2022-10-18 DIAGNOSIS — G9341 Metabolic encephalopathy: Secondary | ICD-10-CM | POA: Diagnosis present

## 2022-10-18 DIAGNOSIS — Z1152 Encounter for screening for COVID-19: Secondary | ICD-10-CM | POA: Diagnosis not present

## 2022-10-18 DIAGNOSIS — E0811 Diabetes mellitus due to underlying condition with ketoacidosis with coma: Principal | ICD-10-CM

## 2022-10-18 DIAGNOSIS — Z7902 Long term (current) use of antithrombotics/antiplatelets: Secondary | ICD-10-CM

## 2022-10-18 DIAGNOSIS — Z8673 Personal history of transient ischemic attack (TIA), and cerebral infarction without residual deficits: Secondary | ICD-10-CM

## 2022-10-18 DIAGNOSIS — F17210 Nicotine dependence, cigarettes, uncomplicated: Secondary | ICD-10-CM | POA: Diagnosis present

## 2022-10-18 DIAGNOSIS — E1010 Type 1 diabetes mellitus with ketoacidosis without coma: Secondary | ICD-10-CM | POA: Diagnosis present

## 2022-10-18 DIAGNOSIS — R55 Syncope and collapse: Secondary | ICD-10-CM | POA: Diagnosis not present

## 2022-10-18 DIAGNOSIS — I96 Gangrene, not elsewhere classified: Secondary | ICD-10-CM | POA: Diagnosis present

## 2022-10-18 DIAGNOSIS — E1022 Type 1 diabetes mellitus with diabetic chronic kidney disease: Secondary | ICD-10-CM | POA: Diagnosis present

## 2022-10-18 DIAGNOSIS — E871 Hypo-osmolality and hyponatremia: Secondary | ICD-10-CM | POA: Diagnosis present

## 2022-10-18 DIAGNOSIS — I129 Hypertensive chronic kidney disease with stage 1 through stage 4 chronic kidney disease, or unspecified chronic kidney disease: Secondary | ICD-10-CM | POA: Diagnosis present

## 2022-10-18 DIAGNOSIS — E1065 Type 1 diabetes mellitus with hyperglycemia: Secondary | ICD-10-CM

## 2022-10-18 LAB — GLUCOSE, CAPILLARY
Glucose-Capillary: >600 mg/dL (ref 70–99)
Glucose-Capillary: >600 mg/dL (ref 70–99)
Glucose-Capillary: >600 mg/dL (ref 70–99)
Glucose-Capillary: >600 mg/dL (ref 70–99)
Glucose-Capillary: >600 mg/dL (ref 70–99)
Glucose-Capillary: >600 mg/dL (ref 70–99)
Glucose-Capillary: >600 mg/dL (ref 70–99)
Glucose-Capillary: >600 mg/dL (ref 70–99)
Glucose-Capillary: >600 mg/dL (ref 70–99)

## 2022-10-18 LAB — POCT I-STAT 7, (LYTES, BLD GAS, ICA,H+H)
Acid-base deficit: 15 mmol/L — ABNORMAL HIGH (ref 0.0–2.0)
Acid-base deficit: 10 mmol/L — ABNORMAL HIGH (ref 0.0–2.0)
Bicarbonate: 10.8 mmol/L — ABNORMAL LOW (ref 20.0–28.0)
Bicarbonate: 14 mmol/L — ABNORMAL LOW (ref 20.0–28.0)
Calcium, Ion: 1.19 mmol/L (ref 1.15–1.40)
Calcium, Ion: 1.22 mmol/L (ref 1.15–1.40)
HCT: 22 % — ABNORMAL LOW (ref 39.0–52.0)
HCT: 20 % — ABNORMAL LOW (ref 39.0–52.0)
Hemoglobin: 7.5 g/dL — ABNORMAL LOW (ref 13.0–17.0)
Hemoglobin: 6.8 g/dL — CL (ref 13.0–17.0)
O2 Saturation: 100 %
O2 Saturation: 100 %
Patient temperature: 99
Patient temperature: 36.9
Potassium: 7.1 mmol/L (ref 3.5–5.1)
Potassium: 5.5 mmol/L — ABNORMAL HIGH (ref 3.5–5.1)
Sodium: 119 mmol/L — CL (ref 135–145)
Sodium: 126 mmol/L — ABNORMAL LOW (ref 135–145)
TCO2: 12 mmol/L — ABNORMAL LOW (ref 22–32)
TCO2: 15 mmol/L — ABNORMAL LOW (ref 22–32)
pCO2 arterial: 26.1 mmHg — ABNORMAL LOW (ref 32–48)
pCO2 arterial: 23.3 mmHg — ABNORMAL LOW (ref 32–48)
pH, Arterial: 7.225 — ABNORMAL LOW (ref 7.35–7.45)
pH, Arterial: 7.384 (ref 7.35–7.45)
pO2, Arterial: 299 mmHg — ABNORMAL HIGH (ref 83–108)
pO2, Arterial: 279 mmHg — ABNORMAL HIGH (ref 83–108)

## 2022-10-18 LAB — BASIC METABOLIC PANEL
Anion gap: 14 (ref 5–15)
Anion gap: 16 — ABNORMAL HIGH (ref 5–15)
BUN: 75 mg/dL — ABNORMAL HIGH (ref 6–20)
BUN: 76 mg/dL — ABNORMAL HIGH (ref 6–20)
CO2: 12 mmol/L — ABNORMAL LOW (ref 22–32)
CO2: 14 mmol/L — ABNORMAL LOW (ref 22–32)
Calcium: 7.8 mg/dL — ABNORMAL LOW (ref 8.9–10.3)
Calcium: 8.8 mg/dL — ABNORMAL LOW (ref 8.9–10.3)
Chloride: 95 mmol/L — ABNORMAL LOW (ref 98–111)
Chloride: 96 mmol/L — ABNORMAL LOW (ref 98–111)
Creatinine, Ser: 3.83 mg/dL — ABNORMAL HIGH (ref 0.61–1.24)
Creatinine, Ser: 3.65 mg/dL — ABNORMAL HIGH (ref 0.61–1.24)
GFR, Estimated: 20 mL/min — ABNORMAL LOW (ref >60)
GFR, Estimated: 21 mL/min — ABNORMAL LOW (ref >60)
Glucose, Bld: >1200 mg/dL (ref 70–99)
Glucose, Bld: 837 mg/dL (ref 70–99)
Potassium: 7.1 mmol/L (ref 3.5–5.1)
Potassium: 5.1 mmol/L (ref 3.5–5.1)
Sodium: 121 mmol/L — ABNORMAL LOW (ref 135–145)
Sodium: 126 mmol/L — ABNORMAL LOW (ref 135–145)

## 2022-10-18 LAB — CBC WITH DIFFERENTIAL/PLATELET
Abs Immature Granulocytes: 0.05 10*3/uL (ref 0.00–0.07)
Basophils Absolute: 0.1 10*3/uL (ref 0.0–0.1)
Basophils Relative: 1 %
Eosinophils Absolute: 0 10*3/uL (ref 0.0–0.5)
Eosinophils Relative: 1 %
HCT: 31.2 % — ABNORMAL LOW (ref 39.0–52.0)
Hemoglobin: 8.3 g/dL — ABNORMAL LOW (ref 13.0–17.0)
Immature Granulocytes: 1 %
Lymphocytes Relative: 19 %
Lymphs Abs: 1 10*3/uL (ref 0.7–4.0)
MCH: 30 pg (ref 26.0–34.0)
MCHC: 26.6 g/dL — ABNORMAL LOW (ref 30.0–36.0)
MCV: 112.6 fL — ABNORMAL HIGH (ref 80.0–100.0)
Monocytes Absolute: 0.5 10*3/uL (ref 0.1–1.0)
Monocytes Relative: 10 %
Neutro Abs: 3.5 10*3/uL (ref 1.7–7.7)
Neutrophils Relative %: 68 %
Platelets: 300 10*3/uL (ref 150–400)
RBC: 2.77 MIL/uL — ABNORMAL LOW (ref 4.22–5.81)
RDW: 15.9 % — ABNORMAL HIGH (ref 11.5–15.5)
WBC: 5.2 10*3/uL (ref 4.0–10.5)
nRBC: 0 % (ref 0.0–0.2)

## 2022-10-18 LAB — CBC
HCT: 24 % — ABNORMAL LOW (ref 39.0–52.0)
Hemoglobin: 7.1 g/dL — ABNORMAL LOW (ref 13.0–17.0)
MCH: 29.7 pg (ref 26.0–34.0)
MCHC: 29.6 g/dL — ABNORMAL LOW (ref 30.0–36.0)
MCV: 100.4 fL — ABNORMAL HIGH (ref 80.0–100.0)
Platelets: 301 10*3/uL (ref 150–400)
RBC: 2.39 MIL/uL — ABNORMAL LOW (ref 4.22–5.81)
RDW: 15.7 % — ABNORMAL HIGH (ref 11.5–15.5)
WBC: 5.6 10*3/uL (ref 4.0–10.5)
nRBC: 0 % (ref 0.0–0.2)

## 2022-10-18 LAB — URINALYSIS, ROUTINE W REFLEX MICROSCOPIC
Bilirubin Urine: NEGATIVE
Glucose, UA: >=500 mg/dL — AB
Hgb urine dipstick: NEGATIVE
Ketones, ur: 5 mg/dL — AB
Leukocytes,Ua: NEGATIVE
Nitrite: NEGATIVE
Protein, ur: 100 mg/dL — AB
Specific Gravity, Urine: 1.018 (ref 1.005–1.030)
pH: 5 (ref 5.0–8.0)

## 2022-10-18 LAB — COMPREHENSIVE METABOLIC PANEL
ALT: 183 U/L — ABNORMAL HIGH (ref 0–44)
AST: 300 U/L — ABNORMAL HIGH (ref 15–41)
Albumin: 2.4 g/dL — ABNORMAL LOW (ref 3.5–5.0)
Alkaline Phosphatase: 98 U/L (ref 38–126)
Anion gap: 20 — ABNORMAL HIGH (ref 5–15)
BUN: 75 mg/dL — ABNORMAL HIGH (ref 6–20)
CO2: 7 mmol/L — ABNORMAL LOW (ref 22–32)
Calcium: 7.4 mg/dL — ABNORMAL LOW (ref 8.9–10.3)
Chloride: 92 mmol/L — ABNORMAL LOW (ref 98–111)
Creatinine, Ser: 3.27 mg/dL — ABNORMAL HIGH (ref 0.61–1.24)
GFR, Estimated: 24 mL/min — ABNORMAL LOW (ref >60)
Glucose, Bld: >1200 mg/dL (ref 70–99)
Potassium: >7.5 mmol/L (ref 3.5–5.1)
Sodium: 119 mmol/L — CL (ref 135–145)
Total Bilirubin: 0.8 mg/dL (ref 0.3–1.2)
Total Protein: 6 g/dL — ABNORMAL LOW (ref 6.5–8.1)

## 2022-10-18 LAB — BLOOD GAS, ARTERIAL
Acid-base deficit: 22.5 mmol/L — ABNORMAL HIGH (ref 0.0–2.0)
Bicarbonate: 6.2 mmol/L — ABNORMAL LOW (ref 20.0–28.0)
Drawn by: 22766
O2 Saturation: 100 %
Patient temperature: 36.9
pCO2 arterial: 22 mmHg — ABNORMAL LOW (ref 32–48)
pH, Arterial: 7.06 — CL (ref 7.35–7.45)
pO2, Arterial: 505 mmHg — ABNORMAL HIGH (ref 83–108)

## 2022-10-18 LAB — LIPASE, BLOOD: Lipase: 112 U/L — ABNORMAL HIGH (ref 11–51)

## 2022-10-18 LAB — BLOOD GAS, VENOUS
Acid-base deficit: 22 mmol/L — ABNORMAL HIGH (ref 0.0–2.0)
Bicarbonate: 7.4 mmol/L — ABNORMAL LOW (ref 20.0–28.0)
Drawn by: 65579
O2 Saturation: 63.7 %
Patient temperature: 34.7
pCO2, Ven: 27 mmHg — ABNORMAL LOW (ref 44–60)
pH, Ven: 7.03 — CL (ref 7.25–7.43)
pO2, Ven: 39 mmHg (ref 32–45)

## 2022-10-18 LAB — TROPONIN I (HIGH SENSITIVITY)
Troponin I (High Sensitivity): 6 ng/L (ref <18)
Troponin I (High Sensitivity): 21 ng/L — ABNORMAL HIGH (ref <18)

## 2022-10-18 LAB — CK: Total CK: 66 U/L (ref 49–397)

## 2022-10-18 LAB — APTT: aPTT: 26 seconds (ref 24–36)

## 2022-10-18 LAB — RESP PANEL BY RT-PCR (RSV, FLU A&B, COVID)  RVPGX2
Influenza A by PCR: NEGATIVE
Influenza B by PCR: NEGATIVE
Resp Syncytial Virus by PCR: NEGATIVE
SARS Coronavirus 2 by RT PCR: NEGATIVE

## 2022-10-18 LAB — LACTIC ACID, PLASMA
Lactic Acid, Venous: 4.9 mmol/L (ref 0.5–1.9)
Lactic Acid, Venous: 3.9 mmol/L (ref 0.5–1.9)
Lactic Acid, Venous: 3.6 mmol/L (ref 0.5–1.9)
Lactic Acid, Venous: 4.9 mmol/L (ref 0.5–1.9)

## 2022-10-18 LAB — BETA-HYDROXYBUTYRIC ACID: Beta-Hydroxybutyric Acid: 5.08 mmol/L — ABNORMAL HIGH (ref 0.05–0.27)

## 2022-10-18 LAB — PROTIME-INR
INR: 1.2 (ref 0.8–1.2)
Prothrombin Time: 15 seconds (ref 11.4–15.2)

## 2022-10-18 LAB — ABO/RH: ABO/RH(D): A POS

## 2022-10-18 LAB — CBG MONITORING, ED
Glucose-Capillary: >600 mg/dL (ref 70–99)
Glucose-Capillary: >600 mg/dL (ref 70–99)

## 2022-10-18 LAB — MRSA NEXT GEN BY PCR, NASAL: MRSA by PCR Next Gen: NOT DETECTED

## 2022-10-18 LAB — FOLATE: Folate: 10.9 ng/mL (ref >5.9)

## 2022-10-18 MED ORDER — ACETAMINOPHEN 650 MG RE SUPP
650.0000 mg | RECTAL | Status: DC
  Filled 2022-10-18: qty 1

## 2022-10-18 MED ORDER — INSULIN REGULAR(HUMAN) IN NACL 100-0.9 UT/100ML-% IV SOLN
INTRAVENOUS | Status: DC
  Administered 2022-10-18: 4.6 [IU]/h via INTRAVENOUS
  Administered 2022-10-19: 11.5 [IU]/h via INTRAVENOUS
  Filled 2022-10-18 (×2): qty 100

## 2022-10-18 MED ORDER — METRONIDAZOLE 500 MG/100ML IV SOLN
500.0000 mg | Freq: Once | INTRAVENOUS | Status: AC
  Administered 2022-10-18: 500 mg via INTRAVENOUS
  Filled 2022-10-18: qty 100

## 2022-10-18 MED ORDER — LABETALOL HCL 5 MG/ML IV SOLN
10.0000 mg | INTRAVENOUS | Status: DC | PRN
  Administered 2022-10-18 – 2022-10-19 (×6): 10 mg via INTRAVENOUS
  Filled 2022-10-18 (×4): qty 4

## 2022-10-18 MED ORDER — VANCOMYCIN VARIABLE DOSE PER UNSTABLE RENAL FUNCTION (PHARMACIST DOSING): Status: DC

## 2022-10-18 MED ORDER — SODIUM BICARBONATE 8.4 % IV SOLN
50.0000 meq | Freq: Once | INTRAVENOUS | Status: AC
  Administered 2022-10-18: 50 meq via INTRAVENOUS
  Filled 2022-10-18: qty 50

## 2022-10-18 MED ORDER — FENTANYL CITRATE PF 50 MCG/ML IJ SOSY: 50.0000 ug | PREFILLED_SYRINGE | INTRAMUSCULAR | Status: DC | PRN

## 2022-10-18 MED ORDER — ACETAMINOPHEN 325 MG PO TABS: 650.0000 mg | ORAL_TABLET | ORAL | Status: DC | PRN

## 2022-10-18 MED ORDER — HEPARIN SODIUM (PORCINE) 5000 UNIT/ML IJ SOLN
5000.0000 [IU] | Freq: Three times a day (TID) | INTRAMUSCULAR | Status: DC
  Administered 2022-10-18 – 2022-10-25 (×20): 5000 [IU] via SUBCUTANEOUS
  Filled 2022-10-18 (×20): qty 1

## 2022-10-18 MED ORDER — CALCIUM GLUCONATE 10 % IV SOLN
1.0000 g | Freq: Once | INTRAVENOUS | Status: AC
  Administered 2022-10-18: 1 g via INTRAVENOUS
  Filled 2022-10-18: qty 10

## 2022-10-18 MED ORDER — CALCIUM GLUCONATE 10 % IV SOLN: 1.0000 g | Freq: Once | INTRAVENOUS | Status: DC

## 2022-10-18 MED ORDER — ACETAMINOPHEN 650 MG RE SUPP: 650.0000 mg | RECTAL | Status: DC | PRN

## 2022-10-18 MED ORDER — SODIUM CHLORIDE 0.9 % IV SOLN
2.0000 g | Freq: Once | INTRAVENOUS | Status: AC
  Administered 2022-10-18: 2 g via INTRAVENOUS
  Filled 2022-10-18: qty 12.5

## 2022-10-18 MED ORDER — LACTATED RINGERS IV BOLUS: 20.0000 mL/kg | Freq: Once | INTRAVENOUS | Status: DC

## 2022-10-18 MED ORDER — POLYETHYLENE GLYCOL 3350 17 G PO PACK
17.0000 g | PACK | Freq: Every day | ORAL | Status: DC
  Administered 2022-10-18 – 2022-10-25 (×4): 17 g
  Filled 2022-10-18 (×7): qty 1

## 2022-10-18 MED ORDER — SODIUM CHLORIDE 0.9 % IV SOLN: 250.0000 mL | INTRAVENOUS | Status: DC

## 2022-10-18 MED ORDER — ONDANSETRON HCL 4 MG/2ML IJ SOLN: 4.0000 mg | Freq: Four times a day (QID) | INTRAMUSCULAR | Status: DC | PRN

## 2022-10-18 MED ORDER — NOREPINEPHRINE 4 MG/250ML-% IV SOLN
INTRAVENOUS | Status: AC
  Filled 2022-10-18: qty 250

## 2022-10-18 MED ORDER — PROPOFOL 1000 MG/100ML IV EMUL
5.0000 ug/kg/min | INTRAVENOUS | Status: DC
  Administered 2022-10-18 – 2022-10-19 (×2): 70 ug/kg/min via INTRAVENOUS
  Administered 2022-10-19: 40 ug/kg/min via INTRAVENOUS
  Filled 2022-10-18 (×4): qty 100

## 2022-10-18 MED ORDER — SODIUM CHLORIDE 0.9 % IV BOLUS: 2000.0000 mL | Freq: Once | INTRAVENOUS | Status: DC

## 2022-10-18 MED ORDER — MIDAZOLAM HCL (PF) 10 MG/2ML IJ SOLN
INTRAMUSCULAR | Status: AC
  Administered 2022-10-18: 10 mg
  Filled 2022-10-18: qty 2

## 2022-10-18 MED ORDER — DEXTROSE IN LACTATED RINGERS 5 % IV SOLN: INTRAVENOUS | Status: DC

## 2022-10-18 MED ORDER — SODIUM CHLORIDE 0.9 % IV BOLUS
2000.0000 mL | Freq: Once | INTRAVENOUS | Status: AC
  Administered 2022-10-18: 2000 mL via INTRAVENOUS

## 2022-10-18 MED ORDER — LACTATED RINGERS IV SOLN: INTRAVENOUS | Status: DC

## 2022-10-18 MED ORDER — VANCOMYCIN HCL IN DEXTROSE 1-5 GM/200ML-% IV SOLN
1000.0000 mg | Freq: Once | INTRAVENOUS | Status: AC
  Administered 2022-10-18: 1000 mg via INTRAVENOUS
  Filled 2022-10-18: qty 200

## 2022-10-18 MED ORDER — SODIUM CHLORIDE 0.9 % IV SOLN: 1.0000 g | INTRAVENOUS | Status: DC

## 2022-10-18 MED ORDER — DEXTROSE 50 % IV SOLN: 0.0000 mL | INTRAVENOUS | Status: DC | PRN

## 2022-10-18 MED ORDER — FAMOTIDINE 20 MG PO TABS
20.0000 mg | ORAL_TABLET | Freq: Every day | ORAL | Status: DC
  Administered 2022-10-18: 20 mg
  Filled 2022-10-18 (×2): qty 1

## 2022-10-18 MED ORDER — CALCIUM GLUCONATE-NACL 1-0.675 GM/50ML-% IV SOLN
1.0000 g | INTRAVENOUS | Status: AC
  Administered 2022-10-18: 1000 mg via INTRAVENOUS
  Filled 2022-10-18: qty 50

## 2022-10-18 MED ORDER — SODIUM CHLORIDE 0.9 % IV SOLN: 2.0000 g | INTRAVENOUS | Status: DC

## 2022-10-18 MED ORDER — FENTANYL CITRATE PF 50 MCG/ML IJ SOSY
50.0000 ug | PREFILLED_SYRINGE | INTRAMUSCULAR | Status: DC | PRN
  Administered 2022-10-18 – 2022-10-19 (×2): 50 ug via INTRAVENOUS
  Filled 2022-10-18 (×2): qty 1

## 2022-10-18 MED ORDER — ACETAMINOPHEN 160 MG/5ML PO SOLN
650.0000 mg | ORAL | Status: DC
  Administered 2022-10-18 – 2022-10-19 (×3): 650 mg
  Filled 2022-10-18 (×3): qty 20.3

## 2022-10-18 MED ORDER — PROPOFOL 1000 MG/100ML IV EMUL
INTRAVENOUS | Status: AC
  Administered 2022-10-18: 5 ug/kg/min via INTRAVENOUS
  Filled 2022-10-18: qty 100

## 2022-10-18 MED ORDER — ACETAMINOPHEN 325 MG PO TABS: 650.0000 mg | ORAL_TABLET | ORAL | Status: DC

## 2022-10-18 MED ORDER — CLOPIDOGREL BISULFATE 75 MG PO TABS
75.0000 mg | ORAL_TABLET | Freq: Every day | ORAL | Status: DC
  Administered 2022-10-18: 75 mg
  Filled 2022-10-18 (×2): qty 1

## 2022-10-18 MED ORDER — BUSPIRONE HCL 10 MG PO TABS: 30.0000 mg | ORAL_TABLET | Freq: Three times a day (TID) | ORAL | Status: DC | PRN

## 2022-10-18 MED ORDER — ALBUTEROL SULFATE (2.5 MG/3ML) 0.083% IN NEBU
10.0000 mg | INHALATION_SOLUTION | Freq: Once | RESPIRATORY_TRACT | Status: AC
  Administered 2022-10-18: 10 mg via RESPIRATORY_TRACT
  Filled 2022-10-18: qty 12

## 2022-10-18 MED ORDER — INSULIN ASPART 100 UNIT/ML IV SOLN
10.0000 [IU] | Freq: Once | INTRAVENOUS | Status: AC
  Administered 2022-10-18: 10 [IU] via INTRAVENOUS

## 2022-10-18 MED ORDER — NOREPINEPHRINE 4 MG/250ML-% IV SOLN: 2.0000 ug/min | INTRAVENOUS | Status: DC

## 2022-10-18 MED ORDER — MAGNESIUM SULFATE 2 GM/50ML IV SOLN
2.0000 g | Freq: Once | INTRAVENOUS | Status: AC | PRN
  Filled 2022-10-18: qty 50

## 2022-10-18 MED ORDER — CHLORHEXIDINE GLUCONATE CLOTH 2 % EX PADS
6.0000 | MEDICATED_PAD | Freq: Every day | CUTANEOUS | Status: DC
  Administered 2022-10-18 – 2022-10-20 (×3): 6 via TOPICAL

## 2022-10-18 MED ORDER — ACETAMINOPHEN 160 MG/5ML PO SOLN: 650.0000 mg | ORAL | Status: DC | PRN

## 2022-10-18 MED ORDER — SODIUM CHLORIDE 0.9% IV SOLUTION: Freq: Once | INTRAVENOUS | Status: DC

## 2022-10-18 NOTE — Progress Notes (Signed)
Transported patient to and from CT Scan on full support and 100% FIO2.  Tolerated well.

## 2022-10-18 NOTE — ED Provider Notes (Signed)
[facility] Provider Note   CSN: [id number] Arrival date & time: 10/18/22  1259     History {Add pertinent medical, surgical, social history, OB history to HPI:1} Chief Complaint  Patient presents with   Cardiac Arrest    Nathaniel [name] is a 37 y.o. male.  Patient has a history of diabetes and kidney disease.  Patient became obtunded at work and then arrested and from the paramedics.  They perform appropriate ACLS   Cardiac Arrest      Home Medications Prior to Admission medications   Medication Sig Start Date End Date Taking? Authorizing Provider  acetaminophen (TYLENOL) 325 MG tablet Take 1-2 tablets (325-650 mg total) by mouth every 4 (four) hours as needed for mild pain. 03/07/18   Love, Ivan Anchors, PA-C  amLODipine (NORVASC) 10 MG tablet Take 1 tablet (10 mg total) by mouth daily. For blood pressure 07/16/20   Emokpae, Courage, MD  ASPIRIN LOW DOSE 81 MG EC tablet Take 81 mg by mouth every morning. 10/21/21   [provider]  atorvastatin (LIPITOR) 80 MG tablet Take 1 tablet (80 mg total) by mouth every evening. For stroke prevention 07/16/20   Roxan Hockey, MD  clopidogrel (PLAVIX) 75 MG tablet Take 1 tablet (75 mg total) by mouth daily. For stroke prevention 07/16/20   Roxan Hockey, MD  Continuous Blood Gluc Sensor (DEXCOM G6 SENSOR) MISC 1 each by Does not apply route every 14 (fourteen) days. 07/21/21   Elayne Snare, MD  Continuous Blood Gluc Transmit (DEXCOM G6 TRANSMITTER) MISC Check sugar 4 times a day 07/21/21   Elayne Snare, MD  DULoxetine (CYMBALTA) 20 MG capsule Take 1 capsule (20 mg total) by mouth daily. Patient not taking: Reported on 01/20/2022 07/16/20 07/16/21  Roxan Hockey, MD  insulin aspart (NOVOLOG) 100 UNIT/ML FlexPen Inject per sliding scale before mealsI: Blood sugars between 200-250 Take 2 units, 250-300 take 3 units, Over 300 Take 4 units 04/23/22   Elayne Snare, MD  Insulin Disposable Pump  (OMNIPOD 5 G6 POD, GEN 5,) MISC 1 Device by Does not apply route every 3 (three) days. 05/26/22   Elayne Snare, MD  Insulin Pen Needle (PEN NEEDLES 3/16") 31G X 5 MM MISC Four times day 07/16/20   Roxan Hockey, MD  labetalol (NORMODYNE) 100 MG tablet Take 1 tablet (100 mg total) by mouth 2 (two) times daily. For BP 07/16/20   Emokpae, Courage, MD  lisinopril (ZESTRIL) 40 MG tablet Take 1 tablet (40 mg total) by mouth daily. For blood pressure 07/16/20   Roxan Hockey, MD  [facility] ULTRA test strip USE TO TEST BLOOD SUGAR 4 TIMES A DAY 11/19/21   Elayne Snare, MD  TRESIBA FLEXTOUCH 100 UNIT/ML FlexTouch Pen INJECT UP TO 10 UNITS ONCE DAILY AS DIRECTED - PLUS UP TO 5 UNITS IF BLOOD SUGAR IS ABOVE 200 - PER SLIDING SCALE PROVIDED BY PHYSICIAN 04/21/22   Elayne Snare, MD  Vitamin D, Ergocalciferol, (DRISDOL) 1.25 MG (50000 UNIT) CAPS capsule Take 1 capsule (50,000 Units total) by mouth once a week. 07/16/20   Roxan Hockey, MD      Allergies    Fructose    Review of Systems   Review of Systems  Physical Exam Updated Vital Signs BP (!) 109/56   Pulse (!) 58   Temp (!) 93.7 F (34.3 C)   Resp 20   Wt 52 kg Comment: per RN  SpO2 100%   BMI 15.99 kg/m  Physical Exam  ED Results / Procedures / Treatments   Labs (all labs ordered are listed, but only abnormal results are displayed) Labs Reviewed  LACTIC ACID, PLASMA - Abnormal; Notable for the following components:      Result Value   Lactic Acid, Venous 4.9 (*)    All other components within normal limits  LACTIC ACID, PLASMA - Abnormal; Notable for the following components:   Lactic Acid, Venous 3.9 (*)    All other components within normal limits  COMPREHENSIVE METABOLIC PANEL - Abnormal; Notable for the following components:   Sodium 119 (*)    Potassium >7.5 (*)    Chloride 92 (*)    CO2 7 (*)    Glucose, Bld >1,200 (*)    BUN 75 (*)    Creatinine, Ser 3.27 (*)    Calcium 7.4 (*)    Total Protein 6.0 (*)    Albumin 2.4  (*)    AST 300 (*)    ALT 183 (*)    GFR, Estimated 24 (*)    Anion gap 20 (*)    All other components within normal limits  CBC WITH DIFFERENTIAL/PLATELET - Abnormal; Notable for the following components:   RBC 2.77 (*)    Hemoglobin 8.3 (*)    HCT 31.2 (*)    MCV 112.6 (*)    MCHC 26.6 (*)    RDW 15.9 (*)    All other components within normal limits  BLOOD GAS, ARTERIAL - Abnormal; Notable for the following components:   pH, Arterial 7.06 (*)    pCO2 arterial 22 (*)    pO2, Arterial 505 (*)    Bicarbonate 6.2 (*)    Acid-base deficit 22.5 (*)    All other components within normal limits  BLOOD GAS, VENOUS - Abnormal; Notable for the following components:   pH, Ven 7.03 (*)    pCO2, Ven 27 (*)    Bicarbonate 7.4 (*)    Acid-base deficit 22.0 (*)    All other components within normal limits  CBG MONITORING, ED - Abnormal; Notable for the following components:   Glucose-Capillary >600 (*)    All other components within normal limits  RESP PANEL BY RT-PCR (RSV, FLU A&B, COVID)  RVPGX2  CULTURE, BLOOD (ROUTINE X 2)  CULTURE, BLOOD (ROUTINE X 2)  URINE CULTURE  PROTIME-INR  APTT  URINALYSIS, ROUTINE W REFLEX MICROSCOPIC  BETA-HYDROXYBUTYRIC ACID  BETA-HYDROXYBUTYRIC ACID  CBG MONITORING, ED  TROPONIN I (HIGH SENSITIVITY)  TROPONIN I (HIGH SENSITIVITY)    EKG None  Radiology DG Chest Port 1 View  Result Date: 10/18/2022 CLINICAL DATA:  Cardiac arrest EXAM: PORTABLE CHEST 1 VIEW COMPARISON:  Chest radiograph 07/14/2020 FINDINGS: Monitoring leads overlie the patient. ET tube mid to distal trachea. Enteric tube courses inferior to the diaphragm. Stable cardiac and mediastinal contours. Lungs are clear. No pleural effusion or pneumothorax. IMPRESSION: ET tube mid to distal trachea. No acute cardiopulmonary process. Electronically Signed   By: Lovey Newcomer M.D.   On: 10/18/2022 13:30    Procedures Procedures  {Document cardiac monitor, telemetry assessment procedure when  appropriate:1}  Medications Ordered in ED Medications  vancomycin (VANCOCIN) IVPB 1000 mg/200 mL premix (1,000 mg Intravenous New Bag/Given 10/18/22 1538)  propofol (DIPRIVAN) 1000 MG/100ML infusion (35 mcg/kg/min  52 kg Intravenous Rate/Dose Change 10/18/22 1450)  norepinephrine (LEVOPHED) 4-5 MG/250ML-% infusion SOLN (has no administration in time range)  vancomycin variable dose per unstable renal function (pharmacist dosing) (has no administration in time range)  lactated ringers bolus  1,040 mL (has no administration in time range)  insulin regular, human (MYXREDLIN) 100 units/ 100 mL infusion (4.6 Units/hr Intravenous New Bag/Given 10/18/22 1545)  lactated ringers infusion (has no administration in time range)  dextrose 5 % in lactated ringers infusion (has no administration in time range)  dextrose 50 % solution 0-50 mL (has no administration in time range)  ceFEPIme (MAXIPIME) 2 g in sodium chloride 0.9 % 100 mL IVPB (has no administration in time range)  ceFEPIme (MAXIPIME) 2 g in sodium chloride 0.9 % 100 mL IVPB (0 g Intravenous Stopped 10/18/22 1529)  metroNIDAZOLE (FLAGYL) IVPB 500 mg (0 mg Intravenous Stopped 10/18/22 1545)  sodium chloride 0.9 % bolus 2,000 mL (2,000 mLs Intravenous Bolus 10/18/22 1406)  midazolam PF (VERSED) 10 MG/2ML injection (10 mg  Given 10/18/22 1401)  sodium chloride 0.9 % bolus 2,000 mL (2,000 mLs Intravenous Bolus 10/18/22 1428)  calcium gluconate inj 10% (1 g) URGENT USE ONLY! (1 g Intravenous Given 10/18/22 1559)  calcium gluconate inj 10% (1 g) URGENT USE ONLY! (1 g Intravenous Given 10/18/22 1557)    ED Course/ Medical Decision Making/ A&P   {  CRITICAL CARE Performed by: Milton Ferguson Total critical care time: 95 minutes Critical care time was exclusive of separately billable procedures and treating other patients. Critical care was necessary to treat or prevent imminent or life-threatening deterioration. Critical care was time spent personally by me  on the following activities: development of treatment plan with patient and/or surrogate as well as nursing, discussions with consultants, evaluation of patient's response to treatment, examination of patient, obtaining history from patient or surrogate, ordering and performing treatments and interventions, ordering and review of laboratory studies, ordering and review of radiographic studies, pulse oximetry and re-evaluation of patient's condition.  Click here for ABCD2, HEART and other calculatorsREFRESH Note before signing :1}                          Medical Decision Making Amount and/or Complexity of Data Reviewed Labs: ordered. Radiology: ordered. ECG/medicine tests: ordered.  Risk Prescription drug management. Decision regarding hospitalization.   Patient with cardiac and respiratory arrest secondary to DKA.  Patient was resuscitated and transferred to [facility], ICU.  Dr. Erskine Emery excepting  {Document critical care time when appropriate:1} {Document review of labs and clinical decision tools ie heart score, Chads2Vasc2 etc:1}  {Document your independent review of radiology images, and any outside records:1} {Document your discussion with family members, caretakers, and with consultants:1} {Document social determinants of health affecting pt's care:1} {Document your decision making why or why not admission, treatments were needed:1} Final Clinical Impression(s) / ED Diagnoses Final diagnoses:  Diabetic ketoacidosis with coma associated with diabetes mellitus due to underlying condition [facility])    Rx / DC Orders ED Discharge Orders     None

## 2022-10-18 NOTE — Progress Notes (Signed)
Elink following code sepsis

## 2022-10-18 NOTE — Progress Notes (Addendum)
[facility] Progress Note Patient Name: EURAL HOLZSCHUH DOB: 07/22/1986 MRN: [id number]   Date of Service  10/18/2022  HPI/Events of Note  Unclear whether the patient received additional 1L bolus. Received documented 4L in 2L x2. Vitals stable, ABG with persistent metabolic acidosis and hyperglycemia  eICU Interventions  Continue the LR continuous, repeat ABG at 10pm  Maintain insulin GTT   2105: Persistent HyperK on 1815 labs. Peaked T's noted on last EKG. Repeat labs at 2200. Will order calcium and Insulin Bolus. CK with next labs.  2234: Anemic without evidence of active bleeding. Relatively stable Hg, hemodynamically stable for now. Will add 1u Prbc routine. Pending remaining labs - improved acidosis and K.  Intervention Category Major Interventions: Acid-Base disturbance - evaluation and management Intermediate Interventions: Hyperglycemia - evaluation and treatment  Mialani Reicks 10/18/2022, 8:30 PM

## 2022-10-18 NOTE — Procedures (Signed)
Arterial Catheter Insertion Procedure Note  MAXIMILLIANO KERSH  [id number]  [birth year]  Date:10/18/22  Time:6:31 PM    Provider Performing: Erma Heritage, NP-S    Procedure: Insertion of Arterial Line [phone]) with US guidance ([id number])   Indication(s) Blood pressure monitoring and/or need for frequent ABGs  Consent Unable to obtain consent due to emergent nature of procedure.  Anesthesia None   Time Out Verified patient identification, verified procedure, site/side was marked, verified correct patient position, special equipment/implants available, medications/allergies/relevant history reviewed, required imaging and test results available.   Sterile Technique Maximal sterile technique including full sterile barrier drape, hand hygiene, sterile gown, sterile gloves, mask, hair covering, sterile ultrasound probe cover (if used).   Procedure Description Area of catheter insertion was cleaned with chlorhexidine and draped in sterile fashion. With real-time ultrasound guidance an arterial catheter was placed into the right radial artery.  Appropriate arterial tracings confirmed on monitor.     Complications/Tolerance None; patient tolerated the procedure well.   EBL Minimal   Specimen(s) None

## 2022-10-18 NOTE — Progress Notes (Addendum)
Pharmacy Antibiotic Note  Nathaniel [name] is a 37 y.o. male admitted on 10/18/2022 after being found unresponsive in wheelchair, patient had episode of . Pharmacy has been consulted for vancomycin and cefepime dosing.  Per last admit patient has chronic kidney disease but had not yet transitioned to hemodialysis, scr is 3.2 today. Will load patient with vancomycin and follow up random levels and redose as indicated.   Plan: Vancomycin 1g IV x1 then dose based on random levels.  Goal trough 15-20 mcg/mL. Cefepime 2g q24 hours  Weight: 52 kg (114 lb 10.2 oz) (per RN)  Temp (24hrs), Avg:93.5 F (34.2 C), Min:93.2 F (34 C), Max:93.7 F (34.3 C)  Recent Labs  Lab 10/18/22 1344 10/18/22 1505  WBC 5.2  --   CREATININE 3.27*  --   LATICACIDVEN 4.9* 3.9*    Estimated Creatinine Clearance: 23 mL/min (A) (by C-G formula based on SCr of 3.27 mg/dL (H)).    Allergies  Allergen Reactions   Fructose Other (See Comments)    Increase of blood sugar   Thank you for allowing pharmacy to be a part of this patient's care.  Erin Hearing PharmD., BCPS Clinical Pharmacist 10/18/2022 3:51 PM

## 2022-10-18 NOTE — Consult Note (Addendum)
NAME:  Nathaniel [name], MRN:  [id number], DOB:  1986/05/25, LOS: 0 ADMISSION DATE:  10/18/2022, CONSULTATION DATE:  1/29 REFERRING MD:  Roderic Palau , CHIEF COMPLAINT:  AMS/ shock    History of Present Illness:  36 yobm diabetic  just d/c from snf 1/28 following prolonged admit at [facility] for R buttock abscess and found unresponsive am 1/29 with severe met acidosis/hyperkalemia ? On 40 mg acei) in setting of CRI and ? Sepsis from L buttock abscess and PCCM service at [facility] asked to consult      Significant Hospital Events: Including procedures, antibiotic start and stop dates in addition to other pertinent events   Cardiac arrest 1/29 > ET >>>    Scheduled Meds:  vancomycin variable dose per unstable renal function (pharmacist dosing)   Does not apply See admin instructions   Continuous Infusions:  [START ON 10/19/2022] ceFEPime (MAXIPIME) IV     dextrose 5% lactated ringers     insulin 4.6 Units/hr (10/18/22 1545)   lactated ringers     lactated ringers     norepinephrine     propofol (DIPRIVAN) infusion 35 mcg/kg/min (10/18/22 1450)   vancomycin 1,000 mg (10/18/22 1538)   PRN Meds:.dextrose, norepinephrine    Interim History / Subjective:  Sedated on vent / large T waves on EKG/ has not started on insulin drip yet.  Objective   Blood pressure (!) 109/56, pulse (!) 58, temperature (!) 93.7 F (34.3 C), resp. rate 20, weight 52 kg, SpO2 100 %.    Vent Mode: PRVC FiO2 (%):  [50 %-100 %] 50 % Set Rate:  [20 bmp-26 bmp] 26 bmp Vt Set:  [600 mL] 600 mL PEEP:  [5 cmH20] 5 cmH20 Plateau Pressure:  [16 cmH20] 16 cmH20   Intake/Output Summary (Last 24 hours) at 10/18/2022 1609 Last data filed at 10/18/2022 1446 Gross per 24 hour  Intake --  Output 100 ml  Net -100 ml   Filed Weights   10/18/22 1330  Weight: 52 kg    Examination: Tmax:  93.7 General appearance:    sedated/ intubated    No jvd Oropharynx et in place Neck supple Lungs with a few scattered exp > insp rhonchi  bilaterally RRR no s3 or or sign murmur Abd soft with nl  excursion  Extr warm with no edema or clubbing noted Neuro  Sensorium sedated  Wound not examined personally, see reports     I personally reviewed images and agree with radiology impression as follows:  CXR:   portable 1/29 ET tube mid to distal trachea. No acute cardiopulmonary process.    Assessment & Plan:  1)  Shock s/p vfib arrest in pt with likely DKA with ? Sepsis from hip wound  >>> rx fluids/ abx/ insulin    2)  Acute resp failure/ vent dep p arrest with severe met acidisosis >>> help offset met acidosis with high vent rate  >>> correct aciodsis with insulin and judicious bicarb unless he gets worse T waves from high K >>> urged nursing to start insulin drip now as a priority and consider rebolusing also  3)  ?  AKI in pt on acei = 40 mg /day last dose ? 1/28 (see baselines below so this is not new Lab Results  Component Value Date   CREATININE 3.27 (H) 10/18/2022   CREATININE 4.60 (H) 09/12/2022   CREATININE 4.30 (H) 01/22/2022   >>> holding acei/ restoring bp off pressors is a priority  4)  DKA with hyperkalemia  with adjusted K likely not as nearly high nd may even become hypokalemic with overly aggressive NaHC03 rx  so rx with Calcium IV and aggressive Insulin for now   5) ? Underlying sepsis from hip wound - rx vanc/ max/ flagyl per ER / dosed per pharmacy 1/29>>>>  Discussed with Dr Dewayne Hatch - given renal issues and possible need for CVVH as well as continuous CCM involvement agree best to transfer asap to [facility] access may needed to tonight/ ? Viable on LUE?   Best Practice (right click and "Reselect all SmartList Selections" daily)  Per triad   Labs   CBC: Recent Labs  Lab 10/18/22 1344  WBC 5.2  NEUTROABS 3.5  HGB 8.3*  HCT 31.2*  MCV 112.6*  PLT 478    Basic Metabolic Panel: Recent Labs  Lab 10/18/22 1344  NA 119*  K >7.5*  CL 92*  CO2 7*  GLUCOSE >1,200*  BUN 75*  CREATININE  3.27*  CALCIUM 7.4*   GFR: Estimated Creatinine Clearance: 23 mL/min (A) (by C-G formula based on SCr of 3.27 mg/dL (H)). Recent Labs  Lab 10/18/22 1344 10/18/22 1505  WBC 5.2  --   LATICACIDVEN 4.9* 3.9*    Liver Function Tests: Recent Labs  Lab 10/18/22 1344  AST 300*  ALT 183*  ALKPHOS 98  BILITOT 0.8  PROT 6.0*  ALBUMIN 2.4*   No results for input(s): "LIPASE", "AMYLASE" in the last 168 hours. No results for input(s): "AMMONIA" in the last 168 hours.  ABG    Component Value Date/Time   PHART 7.06 (LL) 10/18/2022 1345   PCO2ART 22 (L) 10/18/2022 1345   PO2ART 505 (H) 10/18/2022 1345   HCO3 7.4 (L) 10/18/2022 1449   TCO2 20 (L) 01/22/2022 1049   ACIDBASEDEF 22.0 (H) 10/18/2022 1449   O2SAT 63.7 10/18/2022 1449     Coagulation Profile: Recent Labs  Lab 10/18/22 1344  INR 1.2    Cardiac Enzymes: No results for input(s): "CKTOTAL", "CKMB", "CKMBINDEX", "TROPONINI" in the last 168 hours.  HbA1C: Hemoglobin A1C  Date/Time Value Ref Range Status  01/21/2022 12:00 AM 9.4  Final  11/23/2021 04:13 PM 11.2 (A) 4.0 - 5.6 % Final  07/20/2021 03:52 PM 10.7 (A) 4.0 - 5.6 % Final   Hgb A1c MFr Bld  Date/Time Value Ref Range Status  07/14/2020 01:24 PM 11.0 (H) 4.8 - 5.6 % Final    Comment:    (NOTE)         Prediabetes: 5.7 - 6.4         Diabetes: >6.4         Glycemic control for adults with diabetes: <7.0   02/19/2018 05:59 AM 10.0 (H) 4.8 - 5.6 % Final    Comment:    (NOTE) Pre diabetes:          5.7%-6.4% Diabetes:              >6.4% Glycemic control for   <7.0% adults with diabetes     CBG: Recent Labs  Lab 10/18/22 1305  GLUCAP >600*       Past Medical History:  He,  has a past medical history of Diabetes mellitus, Hyperlipidemia, Hypertension, Marijuana abuse, Noncompliance with medication regimen, Stroke ([place]) (2020), and Tobacco abuse.   Surgical History:   Past Surgical History:  Procedure Laterality Date   AV FISTULA PLACEMENT  Left 01/08/2021   Procedure: LEFT ARM ARTERIOVENOUS (AV) FISTULA CREATION;  Surgeon: [name], MD;  Location: [facility];  Service: Vascular;  Laterality: Left;   EYE SURGERY       Social History:   reports that he has been smoking cigarettes. He has a 4.00 pack-year smoking history. He has never used smokeless tobacco. He reports current drug use. Drug: Marijuana. He reports that he does not drink alcohol.   Family History:  His family history includes Diabetes in his father, maternal grandmother, and paternal grandfather; Hypertension in his mother; Kidney failure in his father; Pancreatic cancer in his maternal grandfather.   Allergies Allergies  Allergen Reactions   Fructose Other (See Comments)    Increase of blood sugar      Home Medications  Prior to Admission medications   Medication Sig Start Date End Date Taking? Authorizing Provider  acetaminophen (TYLENOL) 325 MG tablet Take 1-2 tablets (325-650 mg total) by mouth every 4 (four) hours as needed for mild pain. 03/07/18   Love, Ivan Anchors, PA-C  amLODipine (NORVASC) 10 MG tablet Take 1 tablet (10 mg total) by mouth daily. For blood pressure 07/16/20   Emokpae, Courage, MD  ASPIRIN LOW DOSE 81 MG EC tablet Take 81 mg by mouth every morning. 10/21/21   [provider]  atorvastatin (LIPITOR) 80 MG tablet Take 1 tablet (80 mg total) by mouth every evening. For stroke prevention 07/16/20   Roxan Hockey, MD  clopidogrel (PLAVIX) 75 MG tablet Take 1 tablet (75 mg total) by mouth daily. For stroke prevention 07/16/20   Roxan Hockey, MD  Continuous Blood Gluc Sensor (DEXCOM G6 SENSOR) MISC 1 each by Does not apply route every 14 (fourteen) days. 07/21/21   Elayne Snare, MD  Continuous Blood Gluc Transmit (DEXCOM G6 TRANSMITTER) MISC Check sugar 4 times a day 07/21/21   Elayne Snare, MD  DULoxetine (CYMBALTA) 20 MG capsule Take 1 capsule (20 mg total) by mouth daily. Patient not taking: Reported on 01/20/2022 07/16/20 07/16/21   Roxan Hockey, MD  insulin aspart (NOVOLOG) 100 UNIT/ML FlexPen Inject per sliding scale before mealsI: Blood sugars between 200-250 Take 2 units, 250-300 take 3 units, Over 300 Take 4 units 04/23/22   Elayne Snare, MD  Insulin Disposable Pump (OMNIPOD 5 G6 POD, GEN 5,) MISC 1 Device by Does not apply route every 3 (three) days. 05/26/22   Elayne Snare, MD  Insulin Pen Needle (PEN NEEDLES 3/16") 31G X 5 MM MISC Four times day 07/16/20   Roxan Hockey, MD  labetalol (NORMODYNE) 100 MG tablet Take 1 tablet (100 mg total) by mouth 2 (two) times daily. For BP 07/16/20   Emokpae, Courage, MD  lisinopril (ZESTRIL) 40 MG tablet Take 1 tablet (40 mg total) by mouth daily. For blood pressure 07/16/20   Roxan Hockey, MD  [facility] ULTRA test strip USE TO TEST BLOOD SUGAR 4 TIMES A DAY 11/19/21   Elayne Snare, MD  TRESIBA FLEXTOUCH 100 UNIT/ML FlexTouch Pen INJECT UP TO 10 UNITS ONCE DAILY AS DIRECTED - PLUS UP TO 5 UNITS IF BLOOD SUGAR IS ABOVE 200 - PER SLIDING SCALE PROVIDED BY PHYSICIAN 04/21/22   Elayne Snare, MD  Vitamin D, Ergocalciferol, (DRISDOL) 1.25 MG (50000 UNIT) CAPS capsule Take 1 capsule (50,000 Units total) by mouth once a week. 07/16/20   Roxan Hockey, MD     The patient is critically ill with multiple organ systems failure and requires high complexity decision making for assessment and support, frequent evaluation and titration of therapies, application of advanced monitoring technologies and extensive interpretation of multiple databases. Critical Care Time devoted to patient care  services described in this note is 45  minutes.   Christinia Gully, MD Pulmonary and [place] [phone]   After 7:00 pm call Elink  [phone]

## 2022-10-18 NOTE — H&P (Signed)
NAME:  Nathaniel [name], MRN:  [id number], DOB:  1985-10-25, LOS: 0 ADMISSION DATE:  10/18/2022, CONSULTATION DATE: 1/29 REFERRING MD:  Roderic Palau, CHIEF COMPLAINT:  post arrest    History of Present Illness:  37 year old type I diabetic who was just discharged from [facility] on 1/9 after being admitted for sepsis and necrotizing soft tissue abscess of the right buttocks. He underwent surgical debridement, subsequent wound vac and was d/c'd to SNF w/ BID moist to dry dressings, and just discharged to rehab 1/28. Last post-op visit 1/23 noting  "wound appears to have a bed of healing granulation tissue without undrained fluid collections".   Presented to [facility] 1/29 after being found unresponsive and cool to touch in his wheel chair. EMS was called. Became pulseless in route to ER. IO placed, underwent 2 rounds of CPR before ROSC.   On arrival to ER.  Hypothermic 93.2 F Na 119, K >7.5 bicarb 7, BUN 75 cr 3.27, glucose > 1200, lactate 4.9, Ph 7.06, pco2 22 po2 505, anion gap 20 trop I negative BC sent Received 20 ml/kg crystalloid  Vanc and cefepime started.  NE infusion initiated.   Per mother report drank entire carton of milk and 1/2 box of cereal last night   Transferred to [facility] for care    Pertinent  Medical History   DM type I,,CKD Stage 3b - GFR 30 to 44 (Moderately to severely decreased) baseline cr mid-3s  Resides at [facility] for wound care he is s/p right buttocks and superior gluteal cleft debridement for gluteal abscess & necrotizing soft tissue infection. (Admitted 09/12/22 discharged 09/28/22). Now using bid moist to dry kerlex covered by abd HTN, CVA, HL, walked w/ cane at baseline Marijuana abuse Diabetic retinopathy  CVA   Significant Hospital Events: Including procedures, antibiotic start and stop dates in addition to other pertinent events   10/18/2022 - Arrived to [facility] Cardiac Arrest, Intubated, Started on NE , Vanco, and Maxipime   Interim History / Subjective:  Arrived at  [facility]   Objective   Blood pressure (Abnormal) 109/56, pulse (Abnormal) 58, temperature (Abnormal) 93.7 F (34.3 C), resp. rate 20, weight 52 kg, SpO2 100 %.    Vent Mode: PRVC FiO2 (%):  [50 %-100 %] 50 % Set Rate:  [20 bmp-26 bmp] 26 bmp Vt Set:  [600 mL] 600 mL PEEP:  [5 cmH20] 5 cmH20 Plateau Pressure:  [16 cmH20] 16 cmH20   Intake/Output Summary (Last 24 hours) at 10/18/2022 1540 Last data filed at 10/18/2022 1446 Gross per 24 hour  Intake no documentation  Output 100 ml  Net -100 ml   Filed Weights   10/18/22 1330  Weight: 52 kg    Physical Examination: General: Acutely ill-appearing male in NAD. Intubated HEENT: [place]/AT, anicteric sclera, PERRL, moist mucous membranes. ETT and OG in place Neuro: Sedated. Responds to tactile stimuli. Not following commands.  CV: Tachycardic, no m/g/r. PULM: Breathing even and unlabored on PRVC MV. Lung fields CTAB. GI: Soft, nontender, nondistended. Normoactive bowel sounds. Extremities: no LE edema noted. Skin: Warm/dry, right hip wound, sacral wound. No rashes      Resolved Hospital Problem list     Assessment & Plan:  s/p Vfib Arrest > Pt found to be hyperkalemic on arrival, likely in the setting of DKA, Plan - Goal MAP > 65 - Aggressive Fluid resuscitation -repeat lactate - Normotherma, TTM 37  R/o Sepsis 2/2 hip wound >Necrotizing soft tissue abscess to Right Buttock s/p surgical debridement and wound vac  Plan - Trend WBC, fever curve - F/u Cx data - Continue Cefepime and Vancomycin > Start 10/18/2022  -wound care->culture if able -cont damp to dry bid dressing changes  Acute Respiratory Failure  > Intubated 10/18/2022 likely d/t severe metabolic acidosis in the setting of DKA > RVP Negative  Plan  - LTVV, goal plateau <30 and driving pressure <47.  - Sedation with goal RASS 0 to -1   - Daily SAT & SBT once stable - VAP prevention protocol   Metabolic acidosis due to DKA and post-arrest lactic  acidosis Hyperkalemia, pseudo-Hyponatremia (corrects to 137) Plan - Insulin gtt per [employer]oTool  - Cont Fluid resuscitation - repeat chem now. May need to consider bicarb gtt. - CBG monitoring - Trend BMP->need stat chem now & decide on further hyperkalemia rx or not (may need to give CaCl) - Monitor K+ closely   CKD IIIb Baseline scr mid 3s.  Plan - Trend BMP - Replete electrolytes as indicated - Monitor I&Os - F/u urine studies - Avoid nephrotoxic agents as able - Ensure adequate renal perfusion - Holding ACEi - May eventually need CVVHD  Acute metabolic encephalopathy 2/2 profound acidosis  Hx of CVA Plan  - Cont Plavix  - treat DKA -correct metabolic derangements - supportive care   Macrocytic Anemia w/out evidence of bleeding Plan Repeat cbc Transfuse for cbc < 7 Ck folate and b12  Shock liver Plan Supportive care Repeat lfts am   Best Practice (right click and "Reselect all SmartList Selections" daily)   Diet/type: NPO DVT prophylaxis: prophylactic heparin  GI prophylaxis: H2B Lines: N/A Foley:  Foley, as yes it is still needed Code Status:  full code Last date of multidisciplinary goals of care discussion []   Labs   CBC: Recent Labs  Lab 10/18/22 1344  WBC 5.2  NEUTROABS 3.5  HGB 8.3*  HCT 31.2*  MCV 112.6*  PLT 425    Basic Metabolic Panel: Recent Labs  Lab 10/18/22 1344  NA 119*  K >7.5*  CL 92*  CO2 7*  GLUCOSE >1,200*  BUN 75*  CREATININE 3.27*  CALCIUM 7.4*   GFR: Estimated Creatinine Clearance: 23 mL/min (A) (by C-G formula based on SCr of 3.27 mg/dL (H)). Recent Labs  Lab 10/18/22 1344  WBC 5.2  LATICACIDVEN 4.9*    Liver Function Tests: Recent Labs  Lab 10/18/22 1344  AST 300*  ALT 183*  ALKPHOS 98  BILITOT 0.8  PROT 6.0*  ALBUMIN 2.4*   No results for input(s): "LIPASE", "AMYLASE" in the last 168 hours. No results for input(s): "AMMONIA" in the last 168 hours.  ABG    Component Value Date/Time   PHART  7.06 (LL) 10/18/2022 1345   PCO2ART 22 (L) 10/18/2022 1345   PO2ART 505 (H) 10/18/2022 1345   HCO3 7.4 (L) 10/18/2022 1449   TCO2 20 (L) 01/22/2022 1049   ACIDBASEDEF 22.0 (H) 10/18/2022 1449   O2SAT 63.7 10/18/2022 1449     Coagulation Profile: Recent Labs  Lab 10/18/22 1344  INR 1.2    Cardiac Enzymes: No results for input(s): "CKTOTAL", "CKMB", "CKMBINDEX", "TROPONINI" in the last 168 hours.  HbA1C: Hemoglobin A1C  Date/Time Value Ref Range Status  01/21/2022 12:00 AM 9.4  Final  11/23/2021 04:13 PM 11.2 (A) 4.0 - 5.6 % Final  07/20/2021 03:52 PM 10.7 (A) 4.0 - 5.6 % Final   Hgb A1c MFr Bld  Date/Time Value Ref Range Status  07/14/2020 01:24 PM 11.0 (H) 4.8 - 5.6 % Final  Comment:    (NOTE)         Prediabetes: 5.7 - 6.4         Diabetes: >6.4         Glycemic control for adults with diabetes: <7.0   02/19/2018 05:59 AM 10.0 (H) 4.8 - 5.6 % Final    Comment:    (NOTE) Pre diabetes:          5.7%-6.4% Diabetes:              >6.4% Glycemic control for   <7.0% adults with diabetes     CBG: Recent Labs  Lab 10/18/22 1305  GLUCAP >600*    Review of Systems:   Not able   Past Medical History:  He,  has a past medical history of Diabetes mellitus, Hyperlipidemia, Hypertension, Marijuana abuse, Noncompliance with medication regimen, Stroke ([place]) (2020), and Tobacco abuse.   Surgical History:   Past Surgical History:  Procedure Laterality Date   AV FISTULA PLACEMENT Left 01/08/2021   Procedure: LEFT ARM ARTERIOVENOUS (AV) FISTULA CREATION;  Surgeon: [name], MD;  Location: [facility];  Service: Vascular;  Laterality: Left;   EYE SURGERY       Social History:   reports that he has been smoking cigarettes. He has a 4.00 pack-year smoking history. He has never used smokeless tobacco. He reports current drug use. Drug: Marijuana. He reports that he does not drink alcohol.   Family History:  His family history includes Diabetes in his father, maternal  grandmother, and paternal grandfather; Hypertension in his mother; Kidney failure in his father; Pancreatic cancer in his maternal grandfather.   Allergies Allergies  Allergen Reactions   Fructose Other (See Comments)    Increase of blood sugar      Home Medications  Prior to Admission medications   Medication Sig Start Date End Date Taking? Authorizing Provider  acetaminophen (TYLENOL) 325 MG tablet Take 1-2 tablets (325-650 mg total) by mouth every 4 (four) hours as needed for mild pain. 03/07/18   Love, Ivan Anchors, PA-C  amLODipine (NORVASC) 10 MG tablet Take 1 tablet (10 mg total) by mouth daily. For blood pressure 07/16/20   Emokpae, Courage, MD  ASPIRIN LOW DOSE 81 MG EC tablet Take 81 mg by mouth every morning. 10/21/21   [provider]  atorvastatin (LIPITOR) 80 MG tablet Take 1 tablet (80 mg total) by mouth every evening. For stroke prevention 07/16/20   Roxan Hockey, MD  clopidogrel (PLAVIX) 75 MG tablet Take 1 tablet (75 mg total) by mouth daily. For stroke prevention 07/16/20   Roxan Hockey, MD  Continuous Blood Gluc Sensor (DEXCOM G6 SENSOR) MISC 1 each by Does not apply route every 14 (fourteen) days. 07/21/21   Elayne Snare, MD  Continuous Blood Gluc Transmit (DEXCOM G6 TRANSMITTER) MISC Check sugar 4 times a day 07/21/21   Elayne Snare, MD  DULoxetine (CYMBALTA) 20 MG capsule Take 1 capsule (20 mg total) by mouth daily. Patient not taking: Reported on 01/20/2022 07/16/20 07/16/21  Roxan Hockey, MD  insulin aspart (NOVOLOG) 100 UNIT/ML FlexPen Inject per sliding scale before mealsI: Blood sugars between 200-250 Take 2 units, 250-300 take 3 units, Over 300 Take 4 units 04/23/22   Elayne Snare, MD  Insulin Disposable Pump (OMNIPOD 5 G6 POD, GEN 5,) MISC 1 Device by Does not apply route every 3 (three) days. 05/26/22   Elayne Snare, MD  Insulin Pen Needle (PEN NEEDLES 3/16") 31G X 5 MM MISC Four times day 07/16/20  Roxan Hockey, MD  labetalol (NORMODYNE) 100 MG tablet  Take 1 tablet (100 mg total) by mouth 2 (two) times daily. For BP 07/16/20   Emokpae, Courage, MD  lisinopril (ZESTRIL) 40 MG tablet Take 1 tablet (40 mg total) by mouth daily. For blood pressure 07/16/20   Roxan Hockey, MD  [facility] ULTRA test strip USE TO TEST BLOOD SUGAR 4 TIMES A DAY 11/19/21   Elayne Snare, MD  TRESIBA FLEXTOUCH 100 UNIT/ML FlexTouch Pen INJECT UP TO 10 UNITS ONCE DAILY AS DIRECTED - PLUS UP TO 5 UNITS IF BLOOD SUGAR IS ABOVE 200 - PER SLIDING SCALE PROVIDED BY PHYSICIAN 04/21/22   Elayne Snare, MD  Vitamin D, Ergocalciferol, (DRISDOL) 1.25 MG (50000 UNIT) CAPS capsule Take 1 capsule (50,000 Units total) by mouth once a week. 07/16/20   Roxan Hockey, MD     Critical care time:     Erma Heritage, NP-S Cct deferred to Attending

## 2022-10-18 NOTE — Progress Notes (Signed)
At this time the vasopressor is not infusing/started. This nurse notified Anthea RN, the patient's nurse that this PIV could not be used to infuse any vasopressors d/t the fact that this PIV is placed to upper arm.. If vasopressor PIV was needed, VAST consult would need to be placed again for that type of line. VU. Fran Lowes, RN VAST

## 2022-10-18 NOTE — ED Triage Notes (Signed)
Pt brought in by ems for CPR; ems reports they found pt sitting in wheelchair and cold to the touch upon arrival  Family told ems pt was released from a SNF yesterday for rehab  Pt has bandage to right hip  Ems reports pt became pulseless en route to hospital and CPR was started  Upon arrival pt was intubated   Pt was given 2 rounds of epi en route by ems through 22mm IO

## 2022-10-18 NOTE — Progress Notes (Signed)
EEG complete - results pending 

## 2022-10-18 NOTE — Progress Notes (Signed)
Phone call to nurse about lactic acid not being drawn, she will check

## 2022-10-18 NOTE — ED Notes (Signed)
Patient placed on bair hugger , core temp 59F

## 2022-10-18 NOTE — Progress Notes (Signed)
Notified bedside nurse of need to draw lactic acid.

## 2022-10-19 DIAGNOSIS — I469 Cardiac arrest, cause unspecified: Secondary | ICD-10-CM | POA: Diagnosis not present

## 2022-10-19 DIAGNOSIS — R4182 Altered mental status, unspecified: Secondary | ICD-10-CM | POA: Diagnosis not present

## 2022-10-19 LAB — GLUCOSE, CAPILLARY
Glucose-Capillary: 110 mg/dL — ABNORMAL HIGH (ref 70–99)
Glucose-Capillary: 121 mg/dL — ABNORMAL HIGH (ref 70–99)
Glucose-Capillary: 128 mg/dL — ABNORMAL HIGH (ref 70–99)
Glucose-Capillary: 130 mg/dL — ABNORMAL HIGH (ref 70–99)
Glucose-Capillary: 148 mg/dL — ABNORMAL HIGH (ref 70–99)
Glucose-Capillary: 156 mg/dL — ABNORMAL HIGH (ref 70–99)
Glucose-Capillary: 156 mg/dL — ABNORMAL HIGH (ref 70–99)
Glucose-Capillary: 205 mg/dL — ABNORMAL HIGH (ref 70–99)
Glucose-Capillary: 274 mg/dL — ABNORMAL HIGH (ref 70–99)
Glucose-Capillary: 357 mg/dL — ABNORMAL HIGH (ref 70–99)
Glucose-Capillary: 396 mg/dL — ABNORMAL HIGH (ref 70–99)
Glucose-Capillary: 416 mg/dL — ABNORMAL HIGH (ref 70–99)
Glucose-Capillary: 445 mg/dL — ABNORMAL HIGH (ref 70–99)
Glucose-Capillary: 471 mg/dL — ABNORMAL HIGH (ref 70–99)
Glucose-Capillary: 492 mg/dL — ABNORMAL HIGH (ref 70–99)
Glucose-Capillary: 504 mg/dL (ref 70–99)
Glucose-Capillary: 527 mg/dL (ref 70–99)
Glucose-Capillary: 548 mg/dL (ref 70–99)
Glucose-Capillary: 569 mg/dL (ref 70–99)
Glucose-Capillary: 83 mg/dL (ref 70–99)
Glucose-Capillary: 94 mg/dL (ref 70–99)

## 2022-10-19 LAB — CBC
HCT: 22.7 % — ABNORMAL LOW (ref 39.0–52.0)
Hemoglobin: 7.8 g/dL — ABNORMAL LOW (ref 13.0–17.0)
MCH: 29.1 pg (ref 26.0–34.0)
MCHC: 34.4 g/dL (ref 30.0–36.0)
MCV: 84.7 fL (ref 80.0–100.0)
Platelets: 288 10*3/uL (ref 150–400)
RBC: 2.68 MIL/uL — ABNORMAL LOW (ref 4.22–5.81)
RDW: 14.9 % (ref 11.5–15.5)
WBC: 6.6 10*3/uL (ref 4.0–10.5)
nRBC: 0 % (ref 0.0–0.2)

## 2022-10-19 LAB — POCT I-STAT 7, (LYTES, BLD GAS, ICA,H+H)
Acid-base deficit: 11 mmol/L — ABNORMAL HIGH (ref 0.0–2.0)
Acid-base deficit: 7 mmol/L — ABNORMAL HIGH (ref 0.0–2.0)
Bicarbonate: 13 mmol/L — ABNORMAL LOW (ref 20.0–28.0)
Bicarbonate: 15.4 mmol/L — ABNORMAL LOW (ref 20.0–28.0)
Calcium, Ion: 1.25 mmol/L (ref 1.15–1.40)
Calcium, Ion: 1.28 mmol/L (ref 1.15–1.40)
HCT: 20 % — ABNORMAL LOW (ref 39.0–52.0)
HCT: 21 % — ABNORMAL LOW (ref 39.0–52.0)
Hemoglobin: 6.8 g/dL — CL (ref 13.0–17.0)
Hemoglobin: 7.1 g/dL — ABNORMAL LOW (ref 13.0–17.0)
O2 Saturation: 100 %
O2 Saturation: 100 %
Patient temperature: 36.9
Patient temperature: 37
Potassium: 3.9 mmol/L (ref 3.5–5.1)
Potassium: 4.8 mmol/L (ref 3.5–5.1)
Sodium: 129 mmol/L — ABNORMAL LOW (ref 135–145)
Sodium: 135 mmol/L (ref 135–145)
TCO2: 14 mmol/L — ABNORMAL LOW (ref 22–32)
TCO2: 16 mmol/L — ABNORMAL LOW (ref 22–32)
pCO2 arterial: 19 mmHg — CL (ref 32–48)
pCO2 arterial: 21.9 mmHg — ABNORMAL LOW (ref 32–48)
pH, Arterial: 7.38 (ref 7.35–7.45)
pH, Arterial: 7.518 — ABNORMAL HIGH (ref 7.35–7.45)
pO2, Arterial: 229 mmHg — ABNORMAL HIGH (ref 83–108)
pO2, Arterial: 236 mmHg — ABNORMAL HIGH (ref 83–108)

## 2022-10-19 LAB — URINE CULTURE: Culture: NO GROWTH

## 2022-10-19 LAB — BASIC METABOLIC PANEL
Anion gap: 11 (ref 5–15)
Anion gap: 14 (ref 5–15)
BUN: 72 mg/dL — ABNORMAL HIGH (ref 6–20)
BUN: 69 mg/dL — ABNORMAL HIGH (ref 6–20)
CO2: 16 mmol/L — ABNORMAL LOW (ref 22–32)
CO2: 13 mmol/L — ABNORMAL LOW (ref 22–32)
Calcium: 8.8 mg/dL — ABNORMAL LOW (ref 8.9–10.3)
Calcium: 8.5 mg/dL — ABNORMAL LOW (ref 8.9–10.3)
Chloride: 104 mmol/L (ref 98–111)
Chloride: 102 mmol/L (ref 98–111)
Creatinine, Ser: 3.46 mg/dL — ABNORMAL HIGH (ref 0.61–1.24)
Creatinine, Ser: 3.45 mg/dL — ABNORMAL HIGH (ref 0.61–1.24)
GFR, Estimated: 23 mL/min — ABNORMAL LOW (ref >60)
GFR, Estimated: 23 mL/min — ABNORMAL LOW (ref >60)
Glucose, Bld: 448 mg/dL — ABNORMAL HIGH (ref 70–99)
Glucose, Bld: 152 mg/dL — ABNORMAL HIGH (ref 70–99)
Potassium: 4.2 mmol/L (ref 3.5–5.1)
Potassium: 3.7 mmol/L (ref 3.5–5.1)
Sodium: 131 mmol/L — ABNORMAL LOW (ref 135–145)
Sodium: 129 mmol/L — ABNORMAL LOW (ref 135–145)

## 2022-10-19 LAB — HEPATIC FUNCTION PANEL
ALT: 140 U/L — ABNORMAL HIGH (ref 0–44)
AST: 96 U/L — ABNORMAL HIGH (ref 15–41)
Albumin: 1.9 g/dL — ABNORMAL LOW (ref 3.5–5.0)
Alkaline Phosphatase: 81 U/L (ref 38–126)
Bilirubin, Direct: 0.2 mg/dL (ref 0.0–0.2)
Indirect Bilirubin: 0.4 mg/dL (ref 0.3–0.9)
Total Bilirubin: 0.6 mg/dL (ref 0.3–1.2)
Total Protein: 5.3 g/dL — ABNORMAL LOW (ref 6.5–8.1)

## 2022-10-19 LAB — HEMOGLOBIN A1C
Hgb A1c MFr Bld: 7.6 % — ABNORMAL HIGH (ref 4.8–5.6)
Mean Plasma Glucose: 171.42 mg/dL

## 2022-10-19 LAB — VITAMIN B12: Vitamin B-12: 795 pg/mL (ref 180–914)

## 2022-10-19 LAB — LACTIC ACID, PLASMA: Lactic Acid, Venous: 2.4 mmol/L (ref 0.5–1.9)

## 2022-10-19 LAB — PREPARE RBC (CROSSMATCH)

## 2022-10-19 LAB — BETA-HYDROXYBUTYRIC ACID: Beta-Hydroxybutyric Acid: 0.07 mmol/L (ref 0.05–0.27)

## 2022-10-19 MED ORDER — ASPIRIN 81 MG PO TBEC
81.0000 mg | DELAYED_RELEASE_TABLET | Freq: Every morning | ORAL | Status: DC
  Administered 2022-10-19 – 2022-10-25 (×7): 81 mg via ORAL
  Filled 2022-10-19 (×7): qty 1

## 2022-10-19 MED ORDER — BUSPIRONE HCL 10 MG PO TABS: 30.0000 mg | ORAL_TABLET | Freq: Three times a day (TID) | ORAL | Status: AC | PRN

## 2022-10-19 MED ORDER — CLOPIDOGREL BISULFATE 75 MG PO TABS
75.0000 mg | ORAL_TABLET | Freq: Every day | ORAL | Status: DC
  Administered 2022-10-19: 75 mg via ORAL

## 2022-10-19 MED ORDER — INSULIN ASPART 100 UNIT/ML IJ SOLN: 2.0000 [IU] | INTRAMUSCULAR | Status: DC

## 2022-10-19 MED ORDER — INSULIN DETEMIR 100 UNIT/ML ~~LOC~~ SOLN
8.0000 [IU] | Freq: Two times a day (BID) | SUBCUTANEOUS | Status: DC
  Administered 2022-10-19: 8 [IU] via SUBCUTANEOUS
  Filled 2022-10-19 (×2): qty 0.08

## 2022-10-19 MED ORDER — ATORVASTATIN CALCIUM 80 MG PO TABS
80.0000 mg | ORAL_TABLET | Freq: Every evening | ORAL | Status: DC
  Administered 2022-10-19 – 2022-10-24 (×6): 80 mg via ORAL
  Filled 2022-10-19 (×6): qty 1

## 2022-10-19 MED ORDER — ORAL CARE MOUTH RINSE
15.0000 mL | OROMUCOSAL | Status: DC
  Administered 2022-10-19 (×4): 15 mL via OROMUCOSAL

## 2022-10-19 MED ORDER — AMLODIPINE BESYLATE 10 MG PO TABS
10.0000 mg | ORAL_TABLET | Freq: Every day | ORAL | Status: DC
  Administered 2022-10-19 – 2022-10-25 (×7): 10 mg via ORAL
  Filled 2022-10-19 (×7): qty 1

## 2022-10-19 MED ORDER — ACETAMINOPHEN 650 MG RE SUPP: 650.0000 mg | RECTAL | Status: DC

## 2022-10-19 MED ORDER — ACETAMINOPHEN 160 MG/5ML PO SOLN
650.0000 mg | ORAL | Status: DC
  Administered 2022-10-19: 650 mg
  Filled 2022-10-19: qty 20.3

## 2022-10-19 MED ORDER — INSULIN ASPART 100 UNIT/ML IJ SOLN
10.0000 [IU] | Freq: Once | INTRAMUSCULAR | Status: AC
  Administered 2022-10-19: 10 [IU] via SUBCUTANEOUS

## 2022-10-19 MED ORDER — ORAL CARE MOUTH RINSE: 15.0000 mL | OROMUCOSAL | Status: DC | PRN

## 2022-10-19 MED ORDER — INSULIN DETEMIR 100 UNIT/ML ~~LOC~~ SOLN
15.0000 [IU] | Freq: Two times a day (BID) | SUBCUTANEOUS | Status: DC
  Administered 2022-10-19: 15 [IU] via SUBCUTANEOUS
  Filled 2022-10-19 (×3): qty 0.15

## 2022-10-19 MED ORDER — ACETAMINOPHEN 325 MG PO TABS
325.0000 mg | ORAL_TABLET | ORAL | Status: DC | PRN
  Administered 2022-10-20: 325 mg via ORAL
  Administered 2022-10-20 – 2022-10-25 (×3): 650 mg via ORAL
  Filled 2022-10-19 (×4): qty 2

## 2022-10-19 MED ORDER — ACETAMINOPHEN 325 MG PO TABS
650.0000 mg | ORAL_TABLET | ORAL | Status: DC
  Administered 2022-10-19: 650 mg via ORAL
  Filled 2022-10-19: qty 2

## 2022-10-19 MED ORDER — FAMOTIDINE 20 MG PO TABS
20.0000 mg | ORAL_TABLET | Freq: Every day | ORAL | Status: DC
  Administered 2022-10-19 – 2022-10-25 (×7): 20 mg via ORAL
  Filled 2022-10-19 (×6): qty 1

## 2022-10-19 MED ORDER — LABETALOL HCL 200 MG PO TABS
100.0000 mg | ORAL_TABLET | Freq: Two times a day (BID) | ORAL | Status: DC
  Administered 2022-10-19 – 2022-10-25 (×13): 100 mg via ORAL
  Filled 2022-10-19 (×13): qty 1

## 2022-10-19 MED ORDER — LISINOPRIL 20 MG PO TABS: 40.0000 mg | ORAL_TABLET | Freq: Every day | ORAL | Status: DC

## 2022-10-19 MED ORDER — CLOPIDOGREL BISULFATE 75 MG PO TABS
75.0000 mg | ORAL_TABLET | Freq: Every day | ORAL | Status: DC
  Administered 2022-10-20 – 2022-10-25 (×6): 75 mg via ORAL
  Filled 2022-10-19 (×6): qty 1

## 2022-10-19 MED ORDER — SODIUM CHLORIDE 0.9 % IV SOLN
2.0000 g | INTRAVENOUS | Status: DC
  Administered 2022-10-19: 2 g via INTRAVENOUS
  Filled 2022-10-19 (×2): qty 20

## 2022-10-19 NOTE — Consult Note (Signed)
WOC Nurse Consult Note:  Reason for Consult: Sacral wound; R buttock necrotizing soft tissue abscess  Wound type: R buttock full thickness post surgical debridement  Sacrum Stage 3 Pressure Injury  Pressure Injury POA: Yes Measurement: 1. R buttock 27 cms x 15 cms x 0.5 cms; noted to have 3.5 cms undermining from 2 o'clock to 5 o'clock  2.  Sacrum 6 cms x 2 cms x 1.3 cms  Wound bed: R buttock 90% red moist 10% yellow  Sacrum 100% red moist  Drainage (amount, consistency, odor) minimal serosanguinous  Periwound: intact Dressing procedure/placement/frequency: Cleanse wounds with NS, apply saline moistened gauze twice daily to wound bed, cover with dry gauze, ABD and tape.  With sacral wound use Q tip applicator to make sure entire wound bed is filled with moistened gauze.    Low air loss mattress in use for pressure redistribution and moisture management.  Patient will continue to need low air loss mattress when transferred out of ICU.    POC discussed with patient and bedside nurse.  [place] team will not follow at this time.  Re-consult if further needs arise.   Thank you,     Cypress Fanfan MSN, RN-BC, [employer]

## 2022-10-19 NOTE — ED Notes (Signed)
Patient given 100mg  succinylcholine and 20mg  etomidate via IV in left hand for intubation. Verbal order given by Dr.Zammit.

## 2022-10-19 NOTE — Progress Notes (Addendum)
eLink Physician-Brief Progress Note Patient Name: Nathaniel [name] DOB: 07/28/1986 MRN: [id number]   Date of Service  10/19/2022  HPI/Events of Note  Transitioned off insulin drip earlier today and received 8 u detemir 11 hours ago. Sugars are 400+, still critically ill but extubated this AM. Goal <180.   eICU Interventions  10 units short acting once Increase detemir to 15units with next injection due in the next hour.  Anticipate brittle control, may need to adjust SSI.    0000: Despite added detemir and novolog, CBG nearly 500. Maintain detemir for now, transition back to insulin drip for hyperglycemia.   Intervention Category Intermediate Interventions: Hyperglycemia - evaluation and treatment  Ajit Errico 10/19/2022, 9:16 PM

## 2022-10-19 NOTE — Procedures (Signed)
Patient Name: NOAL ABSHIER  MRN: [id number]  Epilepsy Attending: Lora Havens  Referring Physician/Provider: Erick Colace, NP  Date: 10/18/2022 Duration: 26.39 mins  Patient history: 37 year old male status post cardiac arrest.  EEG to evaluate for seizure.  Level of alertness: lethargic   AEDs during EEG study: None  Technical aspects: This EEG study was done with scalp electrodes positioned according to the 10-20 International system of electrode placement. Electrical activity was reviewed with band pass filter of 1-70Hz , sensitivity of 7 uV/mm, display speed of 31mm/sec with a 60Hz  notched filter applied as appropriate. EEG data were recorded continuously and digitally stored.  Video monitoring was available and reviewed as appropriate.  Description: EEG showed continuous generalized 5 to 6 Hz theta slowing admixed with 12 to 13 Hz beta activity. Hyperventilation and photic stimulation were not performed.     ABNORMALITY - Continuous slow, generalized  IMPRESSION: This study is suggestive of moderate diffuse encephalopathy, nonspecific etiology but could be secondary to sedation.  No seizures or epileptiform discharges were seen throughout the recording.  Viraj Liby Barbra Sarks

## 2022-10-19 NOTE — Procedures (Signed)
Extubation Procedure Note  Patient Details:   Name: Nathaniel [name] DOB: September 03, 1986 MRN: [id number]   Airway Documentation:    Vent end date: 10/19/22 Vent end time: 0857   Evaluation  O2 sats: stable throughout Complications: No apparent complications Patient did tolerate procedure well. Bilateral Breath Sounds: Rhonchi   Yes Cuff leak present prior to extubation. Pt extubated to 4L [place] sats 100%.   Lawernce Keas 10/19/2022, 8:57 AM

## 2022-10-19 NOTE — Progress Notes (Signed)
NAME:  Nathaniel [name], MRN:  [id number], DOB:  April 06, 1986, LOS: 1 ADMISSION DATE:  10/18/2022, CONSULTATION DATE: 1/29 REFERRING MD:  Roderic Palau, CHIEF COMPLAINT:  post arrest    History of Present Illness:  37 year old type I diabetic who was just discharged from [facility] on 1/9 after being admitted for sepsis and necrotizing soft tissue abscess of the right buttocks. He underwent surgical debridement, subsequent wound vac and was d/c'd to SNF w/ BID moist to dry dressings, and just discharged to rehab 1/28. Last post-op visit 1/23 noting  "wound appears to have a bed of healing granulation tissue without undrained fluid collections".   Presented to [facility] 1/29 after being found unresponsive and cool to touch in his wheel chair. EMS was called. Became pulseless in route to ER. IO placed, underwent 2 rounds of CPR before ROSC.   On arrival to ER.  Hypothermic 93.2 F Na 119, K >7.5 bicarb 7, BUN 75 cr 3.27, glucose > 1200, lactate 4.9, Ph 7.06, pco2 22 po2 505, anion gap 20 trop I negative BC sent Received 20 ml/kg crystalloid  Vanc and cefepime started.  NE infusion initiated.   Per mother report drank entire carton of milk and 1/2 box of cereal last night   Transferred to [facility] for care    Pertinent  Medical History   DM type I,,CKD Stage 3b - GFR 30 to 44 (Moderately to severely decreased) baseline cr mid-3s  Resides at [facility] for wound care he is s/p right buttocks and superior gluteal cleft debridement for gluteal abscess & necrotizing soft tissue infection. (Admitted 09/12/22 discharged 09/28/22). Now using bid moist to dry kerlex covered by abd HTN, CVA, HL, walked w/ cane at baseline Marijuana abuse Diabetic retinopathy  CVA   Significant Hospital Events: Including procedures, antibiotic start and stop dates in addition to other pertinent events   10/18/2022 - [facility] to [facility], Intubated, Started on NE , Vanco, and Maxipime   Interim History / Subjective:  Arrived at  [facility]   Objective   Blood pressure (!) 152/95, pulse (!) 103, temperature 98.8 F (37.1 C), resp. rate 16, height 5\' 11"  (1.803 m), weight 69.6 kg, SpO2 100 %.    Vent Mode: PRVC FiO2 (%):  [40 %-100 %] 40 % Set Rate:  [20 bmp-30 bmp] 20 bmp Vt Set:  [450 mL-600 mL] 450 mL PEEP:  [5 cmH20] 5 cmH20 Plateau Pressure:  [16 cmH20] 16 cmH20   Intake/Output Summary (Last 24 hours) at 10/19/2022 0849 Last data filed at 10/19/2022 0800 Gross per 24 hour  Intake 2779.08 ml  Output 1520 ml  Net 1259.08 ml    Filed Weights   10/18/22 1330 10/18/22 1900  Weight: 52 kg 69.6 kg    Physical Examination: No distress on vent Moving purposefully, following commands Lungs clear Abd soft Wounds dressed without strikethrough Some yellow urine in foley bag  ABG alkalotic [place] closed Renal function up but this is around his baseline CBC stable  Assessment & Plan:  OHCA 2/2 severe hyperkalemia in setting of DKA Post arrest pulm/renal/CNS/liver failure- all improving Baseline CKD3b R hip infection- post admit for debridement Sacral pressure ulcer- POA DM1 HTN HLD Stroke  - Vent bundle - SAT/SBT, consider extubation - Swallow screen, diet - Transition to basal bolus insulin - Abx to ceftriaxone, f/u wound culture data - Consider removing foley later today  Best Practice (right click and "Reselect all SmartList Selections" daily)   Diet/type: pending swallow screen DVT prophylaxis:  prophylactic heparin  GI prophylaxis: H2B Lines: N/A Foley:  Foley, as yes it is still needed Code Status:  full code Last date of multidisciplinary goals of care discussion [N/A]  32 min cc time Erskine Emery MD PCCM

## 2022-10-20 ENCOUNTER — Encounter (HOSPITAL_COMMUNITY): Payer: Self-pay | Admitting: Internal Medicine

## 2022-10-20 DIAGNOSIS — I469 Cardiac arrest, cause unspecified: Secondary | ICD-10-CM | POA: Diagnosis not present

## 2022-10-20 LAB — CBC
HCT: 24.9 % — ABNORMAL LOW (ref 39.0–52.0)
Hemoglobin: 8.4 g/dL — ABNORMAL LOW (ref 13.0–17.0)
MCH: 29.2 pg (ref 26.0–34.0)
MCHC: 33.7 g/dL (ref 30.0–36.0)
MCV: 86.5 fL (ref 80.0–100.0)
Platelets: 306 10*3/uL (ref 150–400)
RBC: 2.88 MIL/uL — ABNORMAL LOW (ref 4.22–5.81)
RDW: 15.8 % — ABNORMAL HIGH (ref 11.5–15.5)
WBC: 7.3 10*3/uL (ref 4.0–10.5)
nRBC: 0 % (ref 0.0–0.2)

## 2022-10-20 LAB — TYPE AND SCREEN
ABO/RH(D): A POS
Antibody Screen: NEGATIVE
Unit division: 0

## 2022-10-20 LAB — COMPREHENSIVE METABOLIC PANEL
ALT: 99 U/L — ABNORMAL HIGH (ref 0–44)
AST: 39 U/L (ref 15–41)
Albumin: 1.9 g/dL — ABNORMAL LOW (ref 3.5–5.0)
Alkaline Phosphatase: 77 U/L (ref 38–126)
Anion gap: 13 (ref 5–15)
BUN: 59 mg/dL — ABNORMAL HIGH (ref 6–20)
CO2: 19 mmol/L — ABNORMAL LOW (ref 22–32)
Calcium: 8.1 mg/dL — ABNORMAL LOW (ref 8.9–10.3)
Chloride: 100 mmol/L (ref 98–111)
Creatinine, Ser: 3.33 mg/dL — ABNORMAL HIGH (ref 0.61–1.24)
GFR, Estimated: 24 mL/min — ABNORMAL LOW (ref >60)
Glucose, Bld: 296 mg/dL — ABNORMAL HIGH (ref 70–99)
Potassium: 4.7 mmol/L (ref 3.5–5.1)
Sodium: 132 mmol/L — ABNORMAL LOW (ref 135–145)
Total Bilirubin: 0.3 mg/dL (ref 0.3–1.2)
Total Protein: 5.2 g/dL — ABNORMAL LOW (ref 6.5–8.1)

## 2022-10-20 LAB — AEROBIC CULTURE W GRAM STAIN (SUPERFICIAL SPECIMEN)

## 2022-10-20 LAB — GLUCOSE, CAPILLARY
Glucose-Capillary: 118 mg/dL — ABNORMAL HIGH (ref 70–99)
Glucose-Capillary: 127 mg/dL — ABNORMAL HIGH (ref 70–99)
Glucose-Capillary: 140 mg/dL — ABNORMAL HIGH (ref 70–99)
Glucose-Capillary: 193 mg/dL — ABNORMAL HIGH (ref 70–99)
Glucose-Capillary: 204 mg/dL — ABNORMAL HIGH (ref 70–99)
Glucose-Capillary: 251 mg/dL — ABNORMAL HIGH (ref 70–99)
Glucose-Capillary: 282 mg/dL — ABNORMAL HIGH (ref 70–99)
Glucose-Capillary: 307 mg/dL — ABNORMAL HIGH (ref 70–99)
Glucose-Capillary: 382 mg/dL — ABNORMAL HIGH (ref 70–99)
Glucose-Capillary: 73 mg/dL (ref 70–99)
Glucose-Capillary: 96 mg/dL (ref 70–99)
Glucose-Capillary: 99 mg/dL (ref 70–99)

## 2022-10-20 LAB — BPAM RBC
Blood Product Expiration Date: 202402282359
ISSUE DATE / TIME: 202401300032
Unit Type and Rh: 6200

## 2022-10-20 LAB — C-REACTIVE PROTEIN: CRP: 3.5 mg/dL — ABNORMAL HIGH (ref <1.0)

## 2022-10-20 LAB — LACTIC ACID, PLASMA: Lactic Acid, Venous: 2.2 mmol/L (ref 0.5–1.9)

## 2022-10-20 MED ORDER — JUVEN PO PACK
1.0000 | PACK | Freq: Two times a day (BID) | ORAL | Status: DC
  Administered 2022-10-20 – 2022-10-25 (×10): 1 via ORAL
  Filled 2022-10-20 (×10): qty 1

## 2022-10-20 MED ORDER — INSULIN ASPART 100 UNIT/ML IJ SOLN
10.0000 [IU] | Freq: Once | INTRAMUSCULAR | Status: AC
  Administered 2022-10-20: 10 [IU] via SUBCUTANEOUS

## 2022-10-20 MED ORDER — INSULIN ASPART 100 UNIT/ML IJ SOLN
0.0000 [IU] | Freq: Three times a day (TID) | INTRAMUSCULAR | Status: DC
  Administered 2022-10-20 – 2022-10-21 (×2): 2 [IU] via SUBCUTANEOUS
  Administered 2022-10-21: 3 [IU] via SUBCUTANEOUS

## 2022-10-20 MED ORDER — INSULIN DETEMIR 100 UNIT/ML ~~LOC~~ SOLN
15.0000 [IU] | Freq: Two times a day (BID) | SUBCUTANEOUS | Status: DC
  Administered 2022-10-20: 15 [IU] via SUBCUTANEOUS
  Filled 2022-10-20 (×2): qty 0.15

## 2022-10-20 MED ORDER — INSULIN DETEMIR 100 UNIT/ML ~~LOC~~ SOLN
5.0000 [IU] | Freq: Two times a day (BID) | SUBCUTANEOUS | Status: DC
  Filled 2022-10-20 (×2): qty 0.05

## 2022-10-20 MED ORDER — INSULIN ASPART 100 UNIT/ML IJ SOLN
4.0000 [IU] | Freq: Three times a day (TID) | INTRAMUSCULAR | Status: DC
  Administered 2022-10-20 – 2022-10-21 (×4): 4 [IU] via SUBCUTANEOUS

## 2022-10-20 MED ORDER — SODIUM CHLORIDE 0.9 % IV SOLN
2.0000 g | INTRAVENOUS | Status: AC
  Administered 2022-10-20 – 2022-10-23 (×4): 2 g via INTRAVENOUS
  Filled 2022-10-20 (×3): qty 20

## 2022-10-20 MED ORDER — INSULIN DETEMIR 100 UNIT/ML ~~LOC~~ SOLN
15.0000 [IU] | Freq: Two times a day (BID) | SUBCUTANEOUS | Status: DC
  Administered 2022-10-20 – 2022-10-23 (×7): 15 [IU] via SUBCUTANEOUS
  Filled 2022-10-20 (×10): qty 0.15

## 2022-10-20 MED ORDER — INSULIN ASPART 100 UNIT/ML IJ SOLN
0.0000 [IU] | Freq: Every day | INTRAMUSCULAR | Status: DC
  Administered 2022-10-20: 3 [IU] via SUBCUTANEOUS
  Administered 2022-10-21: 5 [IU] via SUBCUTANEOUS
  Administered 2022-10-22: 2 [IU] via SUBCUTANEOUS
  Administered 2022-10-23: 3 [IU] via SUBCUTANEOUS
  Administered 2022-10-24: 4 [IU] via SUBCUTANEOUS

## 2022-10-20 MED ORDER — INSULIN REGULAR(HUMAN) IN NACL 100-0.9 UT/100ML-% IV SOLN
INTRAVENOUS | Status: DC
  Administered 2022-10-20: 6 [IU]/h via INTRAVENOUS
  Filled 2022-10-20: qty 100

## 2022-10-20 MED ORDER — INSULIN ASPART 100 UNIT/ML IJ SOLN: 2.0000 [IU] | INTRAMUSCULAR | Status: DC

## 2022-10-20 MED ORDER — ADULT MULTIVITAMIN W/MINERALS CH
1.0000 | ORAL_TABLET | Freq: Every day | ORAL | Status: DC
  Administered 2022-10-20 – 2022-10-25 (×6): 1 via ORAL
  Filled 2022-10-20 (×6): qty 1

## 2022-10-20 NOTE — Progress Notes (Signed)
Initial Nutrition Assessment  DOCUMENTATION CODES:   Not applicable (High Nutrition Risk)  INTERVENTION:   Continue Carb Modified Diet; allow double portions of protein  Pt needs addition of novolog meal coverage insulin as he is Type 1 DM; coordinated with Dr. Tamala Julian, Pharmacist and DM Coordinator  Add Juven BID, each packet provides 80 calories, 8 grams of carbohydrate, 2.5  grams of protein (collagen), 7 grams of L-arginine and 7 grams of L-glutamine; supplement contains CaHMB, Vitamins C, E, B12 and Zinc to promote wound healing  Recommend checking CRP, Vit A, Vit C, zinc as pt with increased needs secondary to wound healing. Pt reports excellent appetite but has developed stage 3 PI that he was unaware of, large wound post surgical debridement. Further recommendations to follow   NUTRITION DIAGNOSIS:   Increased nutrient needs related to wound healing, acute illness as evidenced by estimated needs.  GOAL:   Patient will meet greater than or equal to 90% of their needs  MONITOR:   PO intake, Supplement acceptance, Skin, Labs, Weight trends  REASON FOR ASSESSMENT:   Consult Assessment of nutrition requirement/status, Wound healing  ASSESSMENT:   37 yo male admitted s/p VFib arrest, possible sepsis 2/2 hip wound, acute respiratory failure d/t severe metabolic abscess in setting of DKA, macrocytic anemia. Pt recently discharged from [facility] on 09/28/22 (admitted 09/12/22) after being admitted for sepsis with necrotizing soft tissue abscess of right buttocks requiring surgical debridement and discharge to SNF for rehab. PMH includes poorly controlled Type 1 DM, CKD 3b, CVA, diabetic retinopathy.  1/29 Admitted, Intubated 1/30 Extubated  Insulin drip restarted overnight for CBGs in 300-400s. This AM, noted pt eating po diet, off insulin drip and ordered for novolog q 4 hour and basal coverage only. Reached out to team to discuss as pt needs meal coverage as  well  Diet advanced to Carb Modified around lunch time; no recorded po intake but pt reports good appetite, eating 100% of meals. Lunch tray running late on visit and pt reports he is "starving." RN brought him some pudding while he waits.   Pt is unsure how tall he is and unsure how much he weighs; however, pt believes he has not lost any weight. Per weight encounters, no weight loss trend noted.   Pt reports he was eating well during hospitalization at [facility] and did not receive any nutritional supplements or vitamins at that time. Pt reports he was eating well at the SNF and eating well at home.   RD explained the importance of good nutrition, both calories AND protein, with regards to wound healing but also reinforced the need for better DM management as poorly controlled blood sugars can impede wound healing. Also discussed plan for checking vitamins/minerals important for wound healing with plans to supplement pending results. Plan to start MVI today  Pt reports he takes his insulin as prescribed at home and checks his blood sugars regularly.   Pt reports he is currently non-ambulatory, utilizing wheelchair. Prior to admission in Dec 2023, pt walking with cane. Pt reports he was working with therapy at [facility] and believes he is supposed to have home health therapy but has not had any yet. Noted OT/PT consulted this admission  Noted pictures of stage 3 PI to buttocks and full thickness wound to R buttock post debridement for necrotizing soft tissue abscess  Labs: sodium 132 (L), Creatiine 3.33 (H), BUN 59, CBGs 73-492 Meds: ss novolog q 4 hours, semglee   NUTRITION -  FOCUSED PHYSICAL EXAM:  Unable to assess  Diet Order:   Diet Order             Diet Carb Modified Fluid consistency: Thin; Room service appropriate? Yes  Diet effective now                   EDUCATION NEEDS:   Education needs have been addressed  Skin:  Skin Assessment: Skin Integrity Issues: Skin Integrity  Issues:: Stage III, Other (Comment) Stage III: sacrum Other: R buttock full thickness post surgical debridement for necrotizing soft tissue abscess  Last BM:  1/30  Height:   Ht Readings from Last 1 Encounters:  10/18/22 5\' 11"  (1.803 m)    Weight:   Wt Readings from Last 1 Encounters:  10/18/22 69.6 kg   BMI:  Body mass index is 21.4 kg/m.  Estimated Nutritional Needs:   Kcal:  2200-2400 kcals  Protein:  120-140 g  Fluid:  >/= 2L   Kerman Passey MS, RDN, LDN, CNSC Registered Dietitian 3 Clinical Nutrition RD Pager and On-Call Pager Number Located in [place]

## 2022-10-20 NOTE — TOC Initial Note (Signed)
Transition of Care [facility]) - Initial/Assessment Note    Patient Details  Name: Nathaniel [name] MRN: [id number] Date of Birth: Oct 17, 1985  [facility] CM/SW Contact:    Bethena Roys, RN Phone Number: 10/20/2022, 3:43 PM  Clinical Narrative:   Risk for readmission assessment completed. PTA patient was from home with his mother. Case Manager spoke with patients mother and she is agreeable to wound care in the home. Case Manager did discuss that the Staff RN will schedule a time for education regarding wound care. Mother does not have a preference for [place]- agreeable to [facility]. Referral submitted and start of care to begin within 24-48 hours post transition home. PT/OT will see the patient for recommendations. Mother is agreeable to [facility] RN/PT-will continue to follow for transition of care needs as the patient progresses.         Expected Discharge Plan: [place] Barriers to Discharge: No Barriers Identified   Patient Goals and CMS Choice Patient states their goals for this hospitalization and ongoing recovery are:: to return to his mothers home.   Choice offered to / list presented to : Parent      Expected Discharge Plan and Services In-house Referral: NA Discharge Planning Services: CM Consult Post Acute Care Choice: Home Health Living arrangements for the past 2 months: Single Family Home                   DME Agency: NA       HH Arranged: RN, Disease Management, PT [place] Agency: [place] ([place]) Date [place]: 10/20/22 Time [place]: 1536 Representative spoke with at [place]: [place] Arrangements/Services Living arrangements for the past 2 months: [place] Lives with:: Parents Patient language and need for interpreter reviewed:: Yes Do you feel safe going back to the place where you live?: Yes      Need for Family Participation in  Patient Care: Yes (Comment)     Criminal Activity/Legal Involvement Pertinent to Current Situation/Hospitalization: No - Comment as needed  Activities of Daily Living      Permission Sought/Granted Permission sought to share information with : Family Supports, Customer service manager, Case Optician, dispensing granted to share information with : Yes, Verbal Permission Granted     Permission granted to share info w AGENCY: [facility]        Emotional Assessment Appearance:: Appears stated age       Alcohol / Substance Use: Not Applicable Psych Involvement: No (comment)  Admission diagnosis:  DKA (diabetic ketoacidosis) ([place]) [E11.10] Diabetic ketoacidosis with coma associated with diabetes mellitus due to underlying condition ([place]) [E08.11] Patient Active Problem List   Diagnosis Date Noted   Cardiac arrest, cause unspecified ([place]) 10/18/2022   Septic shock ([place]) 10/18/2022   Acute respiratory failure ([place]) 10/18/2022   Primary open angle glaucoma of left eye, mild stage 12/21/2021   SIRS (systemic inflammatory response syndrome) ([place]) 70/96/2836   Acute metabolic encephalopathy due to DKA/AKI/Dehydration 07/15/2020   DKA (diabetic ketoacidosis) ([place]) 07/14/2020   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 determined by examination associated with type 1 diabetes mellitus ([place]) 01/28/2020   Stable treated proliferative diabetic retinopathy of left eye without macular edema determined by examination associated with type 1 diabetes mellitus ([place]) 01/28/2020   Pseudophakia 01/28/2020   CKD stage 3 secondary to diabetes ([place])    Hypoglycemia    Neurosyphilis  Mixed hyperlipidemia    Acute ischemic left MCA stroke ([place]) 02/22/2018   Abnormality of gait following cerebrovascular accident (CVA)    Type 1 diabetes mellitus with peripheral circulatory complications ([place])    Essential hypertension    Dyslipidemia    Syphilis    DKA (diabetic  ketoacidoses) 02/16/2018   CVA (cerebral vascular accident) ([place]) 02/16/2018   Acute renal failure (ARF) ([place]) 10/21/2016   Diabetic ketoacidosis without coma associated with type 1 diabetes mellitus ([place])    Hyperbilirubinemia    Hyponatremia 04/15/2016   DM (diabetes mellitus) type 1, uncontrolled, with ketoacidosis ([place]) 04/15/2016   Hyperglycemia 04/15/2016   AKI (acute kidney injury) ([place]) 01/04/2016   Malignant hypertension 01/04/2016   Noncompliance with medications 01/04/2016   Intractable nausea and vomiting 04/01/2015   Gastroparesis 04/01/2015   Type 1 diabetes mellitus with complication ([place]) 50/56/9794   Chronic hypertension    Nausea with vomiting    Abscess, gluteal, right 06/21/2014   Nausea and vomiting 04/08/2013   Hyperglycemia without ketosis 04/08/2013   Essential hypertension, benign 04/08/2013   Hypercalcemia 07/29/2011   Vomiting 07/28/2011   Leukocytosis 07/28/2011   Hypokalemia 07/28/2011   Dehydration 07/27/2011   Smoker 07/27/2011   Marijuana abuse 07/27/2011   PCP:  Carrolyn Meiers, MD Pharmacy:   [id number] - [place], [address] [place] [address] Phone: [phone] Fax: [phone]  [facility] [place] (New Address) - [place], [place] - [address] AT Previously: [address] [place] [place] [address] Phone: [phone] Fax: [phone]     Social Determinants of Health ([place]) Social History: SDOH Screenings   Depression [phone]): Low Risk  (01/26/2021)  Tobacco Use: High Risk (10/20/2022)   Readmission Risk Interventions    10/20/2022    3:34 PM 07/15/2020    3:13 PM  Readmission Risk Prevention Plan  Transportation Screening Complete Complete  HRI or Home Care Consult Complete Complete  Social Work Consult for [place] Planning/Counseling Complete Complete   Palliative Care Screening Not Applicable Not Applicable  Medication Review (RN Care Manager) Referral to Pharmacy Complete

## 2022-10-20 NOTE — Inpatient Diabetes Management (Addendum)
Inpatient Diabetes Program Recommendations  AACE/ADA: New Consensus Statement on Inpatient Glycemic Control (2015)  Target Ranges:  Prepandial:   less than 140 mg/dL      Peak postprandial:   less than 180 mg/dL (1-2 hours)      Critically ill patients:  140 - 180 mg/dL   Lab Results  Component Value Date   GLUCAP 96 10/20/2022   HGBA1C 7.6 (H) 10/19/2022    Review of Glycemic Control  Diabetes history: Diabetes Type 1 (requires basal, and Novolog for correction and carbohydrate coverage) Outpatient Diabetes medications:  Per Endocrinology visit on 05/10/22 pt should be taking: Tresiba 10 units Daily Novolog 12-14 units if eating full meal Novolog 3-4 units for glucose >250 4 hours after Novolog dose  Current orders for Inpatient glycemic control:  Levemir 15 units q12 hours Novolog 2-6 units Q4 hours  Inpatient Diabetes Program Recommendations:    -  Reduce Semglee to 10 units bid -  Change Novolog to 0-9 units tid + hs scale -  Add Novolog 4 units tid meal coverage if eating >50% of meals  Spoke with pt at bedside regarding A1c and glucose control at home. Pt requesting to be on Novolog 10 units as meal coverage. I discussed with pt his glucose trends here in the hospital. We reviewed his home medications and discussed the need for meal coverage insulin here in the hospital. Collaborated with pharmacy regarding inpatient insulin regimen. Will continue to follow glucose trends in the hospital.   Thanks,  Tama Headings RN, MSN, BC-ADM Inpatient Diabetes Coordinator Team Pager [phone] (8a-5p)

## 2022-10-20 NOTE — Progress Notes (Signed)
NAME:  Nathaniel [name], MRN:  [id number], DOB:  April 13, 1986, LOS: 2 ADMISSION DATE:  10/18/2022, CONSULTATION DATE: 1/29 REFERRING MD:  Roderic Palau, CHIEF COMPLAINT:  post arrest    History of Present Illness:  37 year old type I diabetic who was just discharged from [facility] on 1/9 after being admitted for sepsis and necrotizing soft tissue abscess of the right buttocks. He underwent surgical debridement, subsequent wound vac and was d/c'd to SNF w/ BID moist to dry dressings, and just discharged to rehab 1/28. Last post-op visit 1/23 noting  "wound appears to have a bed of healing granulation tissue without undrained fluid collections".   Presented to [facility] 1/29 after being found unresponsive and cool to touch in his wheel chair. EMS was called. Became pulseless in route to ER. IO placed, underwent 2 rounds of CPR before ROSC.   On arrival to ER.  Hypothermic 93.2 F Na 119, K >7.5 bicarb 7, BUN 75 cr 3.27, glucose > 1200, lactate 4.9, Ph 7.06, pco2 22 po2 505, anion gap 20 trop I negative BC sent Received 20 ml/kg crystalloid  Vanc and cefepime started.  NE infusion initiated.   Per mother report drank entire carton of milk and 1/2 box of cereal last night   Transferred to [facility] for care    Pertinent  Medical History   DM type I,,CKD Stage 3b - GFR 30 to 44 (Moderately to severely decreased) baseline cr mid-3s  Resides at [facility] for wound care he is s/p right buttocks and superior gluteal cleft debridement for gluteal abscess & necrotizing soft tissue infection. (Admitted 09/12/22 discharged 09/28/22). Now using bid moist to dry kerlex covered by abd HTN, CVA, HL, walked w/ cane at baseline Marijuana abuse Diabetic retinopathy  CVA   Significant Hospital Events: Including procedures, antibiotic start and stop dates in addition to other pertinent events   10/18/2022 - Arrived to [facility], Intubated, Started on NE , Vanco, and Maxipime  1/30 extubated, transitioned  Interim  History / Subjective:  No events, back on insulin drip unfortunately. Denies pain, eating.  Objective   Blood pressure (!) 143/93, pulse 95, temperature 97.9 F (36.6 C), resp. rate 17, height 5\' 11"  (1.803 m), weight 69.6 kg, SpO2 100 %.        Intake/Output Summary (Last 24 hours) at 10/20/2022 0930 Last data filed at 10/20/2022 0600 Gross per 24 hour  Intake 1532.34 ml  Output 4971 ml  Net -3438.66 ml    Filed Weights   10/18/22 1330 10/18/22 1900  Weight: 52 kg 69.6 kg    Physical Examination: No distress Follows commands R hip dressed without strikethrough Sacral wound dressed Globally weak  BMP stable LFTs improved CBC ok  Assessment & Plan:  OHCA 2/2 severe hyperkalemia in setting of DKA Post arrest pulm/renal/CNS/liver failure- all back to baseline Baseline CKD3b R hip infection- post admit for debridement Sacral pressure ulcer- POA DM1 with hyperglycemia HTN, HLD- back on home meds Multiple prior CVAs in 2019- with ambulatory issues thereafter, uses cane on good day  - Retrial transitioning to basal bolus insulin, appreciate pharmD and diabetes educator help - PT/OT - O2 wean - Local wound care, offloading as able: "twice daily moist to dry dressing changes with Kerlix gauze covered by ABD pads for absorption" from Dr. Arnetha Courser note 10/12/22.  Both are healing by secondary intention. - Ceftriaxone x 5 days should be fine - Dispo is tricky, he went home for 1 day and ended up with life-threatening  DKA, will ask WOC if his wounds qualify for inpatient care; PT/OT/TOC consults - Stable for transfer to progressive appreciate TRH taking over 10/21/22; remaining issues are WOC/PT/OT/TOC input, blood sugar control  Best Practice (right click and "Reselect all SmartList Selections" daily)   Diet/type: pending swallow screen DVT prophylaxis: prophylactic heparin  GI prophylaxis: H2B Lines: N/A Foley:  Foley, as yes it is still needed Code Status:  full  code Last date of multidisciplinary goals of care discussion [N/A]  Erskine Emery MD PCCM

## 2022-10-21 DIAGNOSIS — I469 Cardiac arrest, cause unspecified: Secondary | ICD-10-CM | POA: Diagnosis not present

## 2022-10-21 LAB — AEROBIC CULTURE W GRAM STAIN (SUPERFICIAL SPECIMEN)

## 2022-10-21 LAB — GLUCOSE, CAPILLARY
Glucose-Capillary: 203 mg/dL — ABNORMAL HIGH (ref 70–99)
Glucose-Capillary: 106 mg/dL — ABNORMAL HIGH (ref 70–99)
Glucose-Capillary: 204 mg/dL — ABNORMAL HIGH (ref 70–99)
Glucose-Capillary: 353 mg/dL — ABNORMAL HIGH (ref 70–99)

## 2022-10-21 LAB — CBC
HCT: 28.3 % — ABNORMAL LOW (ref 39.0–52.0)
Hemoglobin: 9.4 g/dL — ABNORMAL LOW (ref 13.0–17.0)
MCH: 29 pg (ref 26.0–34.0)
MCHC: 33.2 g/dL (ref 30.0–36.0)
MCV: 87.3 fL (ref 80.0–100.0)
Platelets: 292 10*3/uL (ref 150–400)
RBC: 3.24 MIL/uL — ABNORMAL LOW (ref 4.22–5.81)
RDW: 16.2 % — ABNORMAL HIGH (ref 11.5–15.5)
WBC: 6.2 10*3/uL (ref 4.0–10.5)
nRBC: 0 % (ref 0.0–0.2)

## 2022-10-21 LAB — COMPREHENSIVE METABOLIC PANEL
ALT: 65 U/L — ABNORMAL HIGH (ref 0–44)
AST: 26 U/L (ref 15–41)
Albumin: 1.9 g/dL — ABNORMAL LOW (ref 3.5–5.0)
Alkaline Phosphatase: 83 U/L (ref 38–126)
Anion gap: 7 (ref 5–15)
BUN: 58 mg/dL — ABNORMAL HIGH (ref 6–20)
CO2: 23 mmol/L (ref 22–32)
Calcium: 8.5 mg/dL — ABNORMAL LOW (ref 8.9–10.3)
Chloride: 106 mmol/L (ref 98–111)
Creatinine, Ser: 2.91 mg/dL — ABNORMAL HIGH (ref 0.61–1.24)
GFR, Estimated: 28 mL/min — ABNORMAL LOW (ref >60)
Glucose, Bld: 216 mg/dL — ABNORMAL HIGH (ref 70–99)
Potassium: 4.9 mmol/L (ref 3.5–5.1)
Sodium: 136 mmol/L (ref 135–145)
Total Bilirubin: 0.5 mg/dL (ref 0.3–1.2)
Total Protein: 5.6 g/dL — ABNORMAL LOW (ref 6.5–8.1)

## 2022-10-21 NOTE — Care Management Important Message (Signed)
Important Message  Patient Details  Name: Nathaniel [name] MRN: [id number] Date of Birth: Apr 17, 1986   Medicare Important Message Given:  Yes     Shelda Altes 10/21/2022, 11:39 AM

## 2022-10-21 NOTE — Evaluation (Signed)
Physical Therapy Evaluation Patient Details Name: Nathaniel [name] MRN: [id number] DOB: 1986/06/04 Today's Date: 10/21/2022  History of Present Illness  37 year old male presented to [facility] 1/29 after being found unresponsive and cool to touch in his wheel chair. Became pulseless in route to ER. IO placed, underwent 2 rounds of CPR before ROSC. Found to be septic and hyperkalemic in setting of DKA, Extubated 1/30 and transferred to [facility]. Pt with PMH: Discharged from [facility] on 1/9 to SNF after being admitted for sepsis and necrotizing soft tissue abscess of the right buttocks. Discharged home 1/28. 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 Pt reports he is able to ambulate limited community distances with RW, and is independent in his bathing and dressing. Pt's family provide for his iADLs. Pt (and possibly mother who administers insulin) has poor understanding/compliance with management of his CBG and mechanisms of wound healing. Pt reports that when he got home from SNF he had his brother take him to the store where he bought M&Ms and cigarettes. Pt limited in safe mobility by decreased cognition and wound healing in presence of residual R sided weakness from prior CVA ontop of generalized weakness, and decreased balance.  Pt is supervision for bed mobility and min A for transfers and ambulation. PT recommends HHPT at discharge. PT will continue to follow acutely.     Recommendations for follow up therapy are one component of a multi-disciplinary discharge planning process, led by the attending physician.  Recommendations may be updated based on patient status, additional functional criteria and insurance authorization.  Follow Up Recommendations Home health PT      Assistance Recommended at Discharge Intermittent  Supervision/Assistance  Patient can return home with the following  A little help with walking and/or transfers;Assistance with cooking/housework;Direct supervision/assist for medications management;Direct supervision/assist for financial management;Assist for transportation;Help with stairs or ramp for entrance    Equipment Recommendations None recommended by PT     Functional Status Assessment Patient has had a recent decline in their functional status and demonstrates the ability to make significant improvements in function in a reasonable and predictable amount of time.     Precautions / Restrictions Precautions Precautions: Fall Precaution Comments: reports hx of falls Restrictions Weight Bearing Restrictions: No      Mobility  Bed Mobility Overal bed mobility: Needs Assistance Bed Mobility: Supine to Sit     Supine to sit: Supervision     General bed mobility comments: increased time and effort due to pain at wound site, but once sitting EoB pt reports no wound pain    Transfers Overall transfer level: Needs assistance Equipment used: Rolling walker (2 wheels) Transfers: Sit to/from Stand Sit to Stand: Min assist           General transfer comment: pt able to initiate power up but requires min A to come to standing due to onset of R knee pain with initial standing, and knee buckling ultimately able to stand on R LE with UE support from RW    Ambulation/Gait Ambulation/Gait assistance: Min assist Gait Distance (Feet): 60 Feet Assistive device: Rolling walker (2 wheels) Gait Pattern/deviations: Step-through pattern, Decreased weight shift to right, Decreased step length - left Gait velocity: slowed Gait velocity interpretation: <1.8 ft/sec, indicate of risk for recurrent falls  General Gait Details: light min A for safety, ambulates with slowed, generally steady gait, no overt LoB,      Balance Overall balance assessment: Needs assistance Sitting-balance  support: Feet supported, No upper extremity supported Sitting balance-Leahy Scale: Good Sitting balance - Comments: unable to sit fully on R hip due to wound   Standing balance support: Bilateral upper extremity supported Standing balance-Leahy Scale: Fair Standing balance comment: requires UE support due to R LE weakness and pain in knee                             Pertinent Vitals/Pain Pain Assessment Pain Assessment: Faces Faces Pain Scale: Hurts a little bit Pain Location: wound sites, R knee with initial weightbearing    Home Living Family/patient expects to be discharged to:: Private residence Living Arrangements: Parent Available Help at Discharge: Family;Available 24 hours/day Type of Home: Mobile home Home Access: Stairs to enter Entrance Stairs-Rails: Can reach both Entrance Stairs-Number of Steps: 3   Home Layout: One level Home Equipment: Conservation officer, nature (2 wheels);Cane - single point;Wheelchair - manual;Wheelchair - power      Prior Function Prior Level of Function : Needs assist             Mobility Comments: prior to hospitalization the beginning of January pt ambulating with cane, has car but has family drive him places, ADLs Comments: reports independence with bathing and dressing, family provides iADLs, pt reports his mother keeps track of his CBG and insulin admininstration, however pt also reports that she does not give him the full amount that his doctors have prescribed     Hand Dominance   Dominant Hand: Right    Extremity/Trunk Assessment   Upper Extremity Assessment Upper Extremity Assessment: Defer to OT evaluation    Lower Extremity Assessment Lower Extremity Assessment: RLE deficits/detail;LLE deficits/detail RLE Deficits / Details: noted to have blister on R knee and stiffness and swelling that limits full extension, hip and ankle ROM WFL, hip flex 3+/5, knee ext 3+/5, knee flexion 4/5, ankle 4/5 RLE Sensation: decreased  light touch RLE Coordination: decreased fine motor LLE Deficits / Details: generalized weakness       Communication   Communication: No difficulties  Cognition Arousal/Alertness: Awake/alert Behavior During Therapy: WFL for tasks assessed/performed Overall Cognitive Status: Impaired/Different from baseline Area of Impairment: Safety/judgement, Problem solving                         Safety/Judgement: Decreased awareness of deficits   Problem Solving: Slow processing, Requires verbal cues, Requires tactile cues General Comments: oriented and able to follow commands sometimes with increased time,  pt has poor understanding of glycemic control and wound healing, reports when he was discharged from SNF he had his brother take him to the store where he bought M&Ms and cigarettes.        General Comments General comments (skin integrity, edema, etc.): VSS on RA, pt noted to have darkened area and blister on R patellar region, unsure whether he fell on it,        Assessment/Plan    PT Assessment Patient needs continued PT services  PT Problem List Decreased strength;Decreased range of motion;Decreased balance;Decreased mobility;Decreased activity tolerance;Decreased cognition;Decreased coordination;Decreased skin integrity;Pain       PT Treatment Interventions DME instruction;Gait training;Stair training;Functional mobility training;Therapeutic activities;Therapeutic exercise;Balance training;Cognitive remediation;Patient/family education    PT Goals (Current goals can be found in the  Care Plan section)  Acute Rehab PT Goals Patient Stated Goal: get back to drawing PT Goal Formulation: With patient Time For Goal Achievement: 11/04/22 Potential to Achieve Goals: Fair    Frequency Min 3X/week        AM-PAC PT "6 Clicks" Mobility  Outcome Measure Help needed turning from your back to your side while in a flat bed without using bedrails?: None Help needed moving from  lying on your back to sitting on the side of a flat bed without using bedrails?: None Help needed moving to and from a bed to a chair (including a wheelchair)?: A Little Help needed standing up from a chair using your arms (e.g., wheelchair or bedside chair)?: A Little Help needed to walk in hospital room?: A Little Help needed climbing 3-5 steps with a railing? : A Lot 6 Click Score: 19    End of Session Equipment Utilized During Treatment: Gait belt Activity Tolerance: Patient tolerated treatment well Patient left: in bed;with call bell/phone within reach;with bed alarm set Nurse Communication: Mobility status;Other (comment) (R knee blister) PT Visit Diagnosis: Unsteadiness on feet (R26.81);Other abnormalities of gait and mobility (R26.89);Muscle weakness (generalized) (M62.81);History of falling (Z91.81);Pain Pain - Right/Left: Right Pain - part of body: Hip;Knee    Time: 0930-1008 PT Time Calculation (min) (ACUTE ONLY): 38 min   Charges:   PT Evaluation $PT Eval Moderate Complexity: 1 Mod PT Treatments $Therapeutic Activity: 8-22 mins        Kess Mcilwain B. Migdalia Dk PT, DPT Acute Rehabilitation Services Please use secure chat or  Call Office [phone]   [place] 10/21/2022, 10:33 AM

## 2022-10-21 NOTE — Progress Notes (Signed)
Patient placed on Air bed.

## 2022-10-21 NOTE — Evaluation (Signed)
Occupational Therapy Evaluation Patient Details Name: Nathaniel [name] MRN: [id number] DOB: Jun 27, 1986 Today's Date: 10/21/2022   History of Present Illness Patient has a history of diabetes and kidney disease.  He has recently been admitted to [facility] with sepsis and right buttocks abscess needing surgery.  Patient became obtunded and then arrested and with the paramedics.  They perform appropriate ACLS.  When patient arrived to the emergency department he was pulseless. per patient reports he has had 2 strokes years ago and that his r side of his body is weaker. (Simultaneous filing. User may not have seen previous data.)   Clinical Impression   Pt. Was  cooperative with therapy and was motivated to work with OT. Pt. Was not able to perform LE dressing and was ed on use of AE and will need further ed. Pt. Is requiring Min A with sit to stand from elevated bed. Pt. Is Mod a with toilet transfers. Pt. Is needed assist with ADLs and states his family can assist. Pt will need assist at dc and would benefit from [facility] to maximize potential.      Recommendations for follow up therapy are one component of a multi-disciplinary discharge planning process, led by the attending physician.  Recommendations may be updated based on patient status, additional functional criteria and insurance authorization.   Follow Up Recommendations  Home health OT     Assistance Recommended at Discharge    Patient can return home with the following A little help with walking and/or transfers;A lot of help with bathing/dressing/bathroom;Assistance with cooking/housework;Direct supervision/assist for medications management;Assist for transportation    Functional Status Assessment  Patient has had a recent decline in their functional status and demonstrates the ability to make significant improvements in function in a reasonable and predictable amount of time.  Equipment Recommendations    Depends on pt.  Progress and if he is allowed to shower.    Recommendations for Other Services       Precautions / Restrictions Precautions Precautions: Fall (Pt. states he has not had any recent falls.  Simultaneous filing. User may not have seen previous data.) Precaution Comments: reports hx of falls Restrictions Weight Bearing Restrictions: No (Simultaneous filing. User may not have seen previous data.)      Mobility Bed Mobility                    Transfers    Pt. Was Min A with sit to stand from elevated bed and to amb short distance in room with rw.                       Balance                                           ADL either performed or assessed with clinical judgement   ADL Overall ADL's : Needs assistance/impaired Eating/Feeding: Independent   Grooming: Wash/dry hands;Wash/dry face;Min guard;Standing   Upper Body Bathing: Set up;Sitting   Lower Body Bathing: Moderate assistance;Sit to/from stand   Upper Body Dressing : Set up   Lower Body Dressing: Maximal assistance;Sit to/from stand   Toilet Transfer: Moderate assistance;Ambulation;BSC/3in1;Rolling walker (2 wheels)   Toileting- Clothing Manipulation and Hygiene: Moderate assistance;Sit to/from stand       Functional mobility during ADLs: Minimal assistance;Rolling walker (2 wheels) (Min A with sit to stand  from elevated bed.)       Vision Baseline Vision/History: 1 Wears glasses;5 Retinopathy Ability to See in Adequate Light: 0 Adequate Patient Visual Report: No change from baseline Vision Assessment?: No apparent visual deficits     Perception     Praxis      Pertinent Vitals/Pain Pain Assessment Pain Assessment: 0-10 (Simultaneous filing. User may not have seen previous data.) Pain Score: 3  Pain Location: r hip Pain Descriptors / Indicators: Burning, Aching Pain Intervention(s): Patient requesting pain meds-RN notified     Hand Dominance Right (Simultaneous  filing. User may not have seen previous data.)   Extremity/Trunk Assessment Upper Extremity Assessment Upper Extremity Assessment: Overall WFL for tasks assessed (Simultaneous filing. User may not have seen previous data.)       Communication Communication Communication: No difficulties (Simultaneous filing. User may not have seen previous data.)   Cognition Arousal/Alertness: Awake/alert (Simultaneous filing. User may not have seen previous data.) Behavior During Therapy: [facility] for tasks assessed/performed (Simultaneous filing. User may not have seen previous data.) Overall Cognitive Status: Impaired/Different from baseline                                       General Comments      Exercises     Shoulder Instructions      Home Living Family/patient expects to be discharged to:: Private residence (Simultaneous filing. User may not have seen previous data.) Living Arrangements: Parent (Simultaneous filing. User may not have seen previous data.) Available Help at Discharge: Family (Simultaneous filing. User may not have seen previous data.) Type of Home: House (Simultaneous filing. User may not have seen previous data.) Home Access: Stairs to enter (Simultaneous filing. User may not have seen previous data.) Entrance Stairs-Number of Steps: 4 (Simultaneous filing. User may not have seen previous data.) Entrance Stairs-Rails: Can reach both;Left;Right (Simultaneous filing. User may not have seen previous data.) Home Layout: One level (Simultaneous filing. User may not have seen previous data.)     Bathroom Shower/Tub: Tub/shower unit (Simultaneous filing. User may not have seen previous data.)   Bathroom Toilet: Standard (Simultaneous filing. User may not have seen previous data.) Bathroom Accessibility: Yes   Home Equipment: Rolling Walker (2 wheels);Cane - single point;Wheelchair - manual (Simultaneous filing. User may not have seen previous data.)   Additional  Comments: Pt. states after his sx he was using rw for amb and did have wc when he felt tired.      Prior Functioning/Environment Prior Level of Function : Independent/Modified Independent (Simultaneous filing. User may not have seen previous data.)             Mobility Comments: prior to hospitalization the beginning of January pt ambulating with cane, has car but has family drive him places, ADLs Comments: Pt. states after his sx he was still able to care for himself it just took extra time. (Simultaneous filing. User may not have seen previous data.)        OT Problem List: Decreased activity tolerance;Decreased strength;Impaired balance (sitting and/or standing);Decreased knowledge of use of DME or AE      OT Treatment/Interventions: Self-care/ADL training;DME and/or AE instruction;Therapeutic activities;Patient/family education    OT Goals(Current goals can be found in the care plan section) Acute Rehab OT Goals Patient Stated Goal: to get stronger and go home. OT Goal Formulation: With patient Time For Goal Achievement: 11/04/22 Potential to Achieve  Goals: Good ADL Goals Pt Will Perform Lower Body Bathing: with modified independence;sit to/from stand;with adaptive equipment Pt Will Perform Lower Body Dressing: with modified independence;with adaptive equipment;sit to/from stand Pt Will Transfer to Toilet: with supervision;ambulating Pt Will Perform Toileting - Clothing Manipulation and hygiene: with supervision;sit to/from stand  OT Frequency: Min 2X/week    Co-evaluation              AM-PAC OT "6 Clicks" Daily Activity     Outcome Measure Help from another person eating meals?: None Help from another person taking care of personal grooming?: A Little Help from another person toileting, which includes using toliet, bedpan, or urinal?: A Lot Help from another person bathing (including washing, rinsing, drying)?: A Lot Help from another person to put on and taking  off regular upper body clothing?: A Little Help from another person to put on and taking off regular lower body clothing?: A Lot 6 Click Score: 16   End of Session Equipment Utilized During Treatment: Rolling walker (2 wheels) Nurse Communication: Patient requests pain meds  Activity Tolerance: Patient tolerated treatment well Patient left: in bed;with bed alarm set;with nursing/sitter in room;with call bell/phone within reach  OT Visit Diagnosis: Unsteadiness on feet (R26.81);Muscle weakness (generalized) (M62.81)                Time: 2257-5051 OT Time Calculation (min): 30 min Charges:  OT General Charges $OT Visit: 1 Visit OT Evaluation $OT Eval Moderate Complexity: 1 Mod  Mckenna Gamm OT/L   Leisa Gault 10/21/2022, 11:01 AM

## 2022-10-21 NOTE — Progress Notes (Addendum)
[facility] PROGRESS NOTE  Nathaniel [name] (DOB: 01/01/1986) AGT:[id number] PCP: Carrolyn Meiers, MD  Brief Narrative: 37 year old type I diabetic who was just discharged from [facility] on 1/9 after being admitted for sepsis and necrotizing soft tissue abscess of the right buttocks. He underwent surgical debridement, subsequent wound vac and was d/c'd to SNF w/ BID moist to dry dressings, and just discharged to rehab 1/28. Last post-op visit 1/23 noting  "wound appears to have a bed of healing granulation tissue without undrained fluid collections".    Presented to [facility] 1/29 after being found unresponsive and cool to touch in his wheel chair. Per mother report drank entire carton of milk and 1/2 box of cereal last night. EMS was called. Became pulseless in route to ER. IO placed, underwent 2 rounds of CPR before ROSC.    On arrival to ER.  Hypothermic 93.2 F Na 119, K >7.5 bicarb 7, BUN 75 cr 3.27, glucose > 1200, lactate 4.9, Ph 7.06, pco2 22 po2 505, anion gap 20 trop I negative BC sent Received 20 ml/kg crystalloid  Vanc and cefepime started.  NE infusion initiated.    Transferred to [facility] for care   Clinically improved since that time, exiting ICU and transferred to hospitalist service on 2/1. PT/OT consults pending. Glycemic control improving but remains erratic.   Subjective: Feeling stronger, eating fine, no other complaints.   Objective: BP (!) 137/92 (BP Location: Left Arm)   Pulse (!) 107   Temp 97.6 F (36.4 C) (Axillary)   Resp 18   Ht 5\' 11"  (1.803 m)   Wt 69.6 kg   SpO2 100%   BMI 21.40 kg/m   Gen: Nontoxic male in no distress Pulm: Clear, nonlabored  CV: Regular tachycardia, no MRG or edema GI: Soft, NT, ND, +BS  Neuro: Alert and oriented. No new focal deficits. Ext: Warm, no deformities   Assessment & Plan: Principal Problem:   Cardiac arrest, cause unspecified ([place]) Active Problems:   Septic shock (HCC)   Acute respiratory failure (HCC)    Abscess, gluteal, right   Gastroparesis   Type 1 diabetes mellitus with complication (HCC)   Hyponatremia   CVA (cerebral vascular accident) ([place])   Mixed hyperlipidemia   CKD stage 3 secondary to diabetes (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 Attributable to severe metabolic derangements, hyperkalemia, from DKA. Troponin not significantly elevated, no antecedent chest pain. - Maintain on cardiac monitoring.  DKA in poorly-controlled T1DM: HbA1c 7.6%, suspect that level of glycemic control is attributable to protracted stays in hospital/SNF. Glycemic excursions are improving.  - Carb-modified diet - Continue titrating insulin, providing education. Levemir 15u daily, 4u + sensitive SSI with HS correction as ordered.  Acute hypoxic respiratory failure: Resolved  Acute metabolic encephalopathy: Due to DKA and postarrest. Resolved.  AKI on stage IIIb CKD: CrCl near baseline.   R hip infection s/p debridement at [facility], sacral pressure ulcer, POA:  - Local wound care per WOC. Family able to manage. - Local wound care, offloading as able: "twice daily moist to dry dressing changes with Kerlix gauze covered by ABD pads for absorption" from Dr. Arnetha Courser note 10/12/22.  Both are healing by secondary intention. - Has been afebrile with normal WBC. Will plan on 5 days (final  dose 2/2) of ceftriaxone based on S. pyogenes and sensitive Klebsiella on Cx.   LFT elevation: Improving.   HTN:  - Continue home norvasc, labetalol  HLD:  - Continue home atorvastatin.  Multiple prior CVAs in 2019- with ambulatory issues thereafter, uses cane on good day. - PT/OT consulted. Currently will plan to try discharging home once stable. - continue plavix,  statin   Patrecia Pour, MD [facility] [email or web address] 10/21/2022, 1:36 PM

## 2022-10-21 NOTE — Progress Notes (Signed)
Patient's mother Nathaniel [name] will come tomorrow for dressing change at 1000hrs.  Will pass on for nurse not to change dressing until mother is here.

## 2022-10-22 DIAGNOSIS — I469 Cardiac arrest, cause unspecified: Secondary | ICD-10-CM | POA: Diagnosis not present

## 2022-10-22 LAB — COMPREHENSIVE METABOLIC PANEL
ALT: 49 U/L — ABNORMAL HIGH (ref 0–44)
AST: 18 U/L (ref 15–41)
Albumin: 2.1 g/dL — ABNORMAL LOW (ref 3.5–5.0)
Alkaline Phosphatase: 80 U/L (ref 38–126)
Anion gap: 7 (ref 5–15)
BUN: 57 mg/dL — ABNORMAL HIGH (ref 6–20)
CO2: 20 mmol/L — ABNORMAL LOW (ref 22–32)
Calcium: 8.4 mg/dL — ABNORMAL LOW (ref 8.9–10.3)
Chloride: 103 mmol/L (ref 98–111)
Creatinine, Ser: 2.75 mg/dL — ABNORMAL HIGH (ref 0.61–1.24)
GFR, Estimated: 30 mL/min — ABNORMAL LOW (ref >60)
Glucose, Bld: 356 mg/dL — ABNORMAL HIGH (ref 70–99)
Potassium: 5.2 mmol/L — ABNORMAL HIGH (ref 3.5–5.1)
Sodium: 130 mmol/L — ABNORMAL LOW (ref 135–145)
Total Bilirubin: 0.3 mg/dL (ref 0.3–1.2)
Total Protein: 5.7 g/dL — ABNORMAL LOW (ref 6.5–8.1)

## 2022-10-22 LAB — BASIC METABOLIC PANEL
Anion gap: 9 (ref 5–15)
BUN: 56 mg/dL — ABNORMAL HIGH (ref 6–20)
CO2: 21 mmol/L — ABNORMAL LOW (ref 22–32)
Calcium: 8.6 mg/dL — ABNORMAL LOW (ref 8.9–10.3)
Chloride: 106 mmol/L (ref 98–111)
Creatinine, Ser: 2.76 mg/dL — ABNORMAL HIGH (ref 0.61–1.24)
GFR, Estimated: 30 mL/min — ABNORMAL LOW (ref >60)
Glucose, Bld: 169 mg/dL — ABNORMAL HIGH (ref 70–99)
Potassium: 5 mmol/L (ref 3.5–5.1)
Sodium: 136 mmol/L (ref 135–145)

## 2022-10-22 LAB — GLUCOSE, CAPILLARY
Glucose-Capillary: 304 mg/dL — ABNORMAL HIGH (ref 70–99)
Glucose-Capillary: 171 mg/dL — ABNORMAL HIGH (ref 70–99)
Glucose-Capillary: 224 mg/dL — ABNORMAL HIGH (ref 70–99)
Glucose-Capillary: 373 mg/dL — ABNORMAL HIGH (ref 70–99)
Glucose-Capillary: 206 mg/dL — ABNORMAL HIGH (ref 70–99)

## 2022-10-22 LAB — CBC
HCT: 26.6 % — ABNORMAL LOW (ref 39.0–52.0)
Hemoglobin: 9 g/dL — ABNORMAL LOW (ref 13.0–17.0)
MCH: 29.9 pg (ref 26.0–34.0)
MCHC: 33.8 g/dL (ref 30.0–36.0)
MCV: 88.4 fL (ref 80.0–100.0)
Platelets: 281 10*3/uL (ref 150–400)
RBC: 3.01 MIL/uL — ABNORMAL LOW (ref 4.22–5.81)
RDW: 16.1 % — ABNORMAL HIGH (ref 11.5–15.5)
WBC: 8.4 10*3/uL (ref 4.0–10.5)
nRBC: 0 % (ref 0.0–0.2)

## 2022-10-22 MED ORDER — INSULIN ASPART 100 UNIT/ML IJ SOLN
4.0000 [IU] | Freq: Three times a day (TID) | INTRAMUSCULAR | Status: DC
  Administered 2022-10-22 – 2022-10-23 (×6): 4 [IU] via SUBCUTANEOUS

## 2022-10-22 MED ORDER — SODIUM ZIRCONIUM CYCLOSILICATE 10 G PO PACK
10.0000 g | PACK | Freq: Once | ORAL | Status: AC
  Administered 2022-10-22: 10 g via ORAL
  Filled 2022-10-22: qty 1

## 2022-10-22 MED ORDER — INSULIN ASPART 100 UNIT/ML IJ SOLN
0.0000 [IU] | Freq: Three times a day (TID) | INTRAMUSCULAR | Status: DC
  Administered 2022-10-22: 3 [IU] via SUBCUTANEOUS
  Administered 2022-10-22: 7 [IU] via SUBCUTANEOUS
  Administered 2022-10-22: 9 [IU] via SUBCUTANEOUS
  Administered 2022-10-23: 3 [IU] via SUBCUTANEOUS
  Administered 2022-10-23: 7 [IU] via SUBCUTANEOUS
  Administered 2022-10-23 – 2022-10-24 (×2): 5 [IU] via SUBCUTANEOUS
  Administered 2022-10-24: 9 [IU] via SUBCUTANEOUS
  Administered 2022-10-24: 5 [IU] via SUBCUTANEOUS
  Administered 2022-10-25: 9 [IU] via SUBCUTANEOUS

## 2022-10-22 MED FILL — Midazolam HCl Inj 5 MG/5ML (Base Equivalent): INTRAMUSCULAR | Qty: 5 | Status: AC

## 2022-10-22 MED FILL — Fentanyl Citrate Preservative Free (PF) Inj 100 MCG/2ML: INTRAMUSCULAR | Qty: 2 | Status: AC

## 2022-10-22 NOTE — Progress Notes (Signed)
Nutrition Follow-up  DOCUMENTATION CODES:   Not applicable (High Nutrition Risk)  INTERVENTION:   Continue Juven BID, each packet provides 80 calories, 8 grams of carbohydrate, 2.5  grams of protein (collagen), 7 grams of L-arginine and 7 grams of L-glutamine; supplement contains CaHMB, Vitamins C, E, B12 and Zinc to promote wound healing  Vitamin labs pending, further recommendations to follow  Continue MVI with Minerals; further supplementation pending labs  Continue double protein/entree portions with meals  Recommend considering increasing meal coverage insulin but will defer insulin recommendations to DM Coordinator. Noting pt report taking 10-14 units with meals at home.   NUTRITION DIAGNOSIS:   Increased nutrient needs related to wound healing, acute illness as evidenced by estimated needs.  Being addressed   GOAL:   Patient will meet greater than or equal to 90% of their needs  Met  MONITOR:   PO intake, Supplement acceptance, Skin, Labs, Weight trends  REASON FOR ASSESSMENT:   Consult Assessment of nutrition requirement/status, Wound healing  ASSESSMENT:   37 yo male admitted s/p VFib arrest, possible sepsis 2/2 hip wound, acute respiratory failure d/t severe metabolic abscess in setting of DKA, macrocytic anemia. Pt recently discharged from [facility] on 09/28/22 (admitted 09/12/22) after being admitted for sepsis with necrotizing soft tissue abscess of right buttocks requiring surgical debridement and discharge to SNF for rehab. PMH includes poorly controlled Type 1 DM, CKD 3b, CVA, diabetic retinopathy.  1/29 Admitted, Intubated 1/30 Extubated  Pt reports appetite remains great. Eating lunch on visit today. Recorded po intake 100% of meals Pt reports he enjoys the Juven and is taking 2x daily  Pt reports he got up and walked with PT. Noted plan for HHPT at discharge  No weight since 1/29; recommend standing weight if able  Micronutrient Labs:   CRP: 3.5  Vitamin A: pending  Vitamin C: pending  Zinc: pending   Labs: sodium 136, potassium 5.0 (wdl), CBGs 106-353 Meds: lokelma x 1 today, miralax, MVI with Minerals, ss novolog, 15 units levemir BID, 4 units novolog with meals  NUTRITION - FOCUSED PHYSICAL EXAM: significant wasting in LE likely related to immobility over the last 2 months  Flowsheet Row Most Recent Value  Orbital Region No depletion  Upper Arm Region No depletion  Thoracic and Lumbar Region No depletion  Buccal Region No depletion  Temple Region No depletion  Clavicle Bone Region No depletion  Clavicle and Acromion Bone Region No depletion  Scapular Bone Region No depletion  Dorsal Hand No depletion  Patellar Region Moderate depletion  Anterior Thigh Region Moderate depletion  Posterior Calf Region Moderate depletion  Edema (RD Assessment) None       Diet Order:   Diet Order             Diet Carb Modified Fluid consistency: Thin; Room service appropriate? Yes  Diet effective now                   EDUCATION NEEDS:   Education needs have been addressed  Skin:  Skin Assessment: Skin Integrity Issues: Skin Integrity Issues:: Stage III, Other (Comment) Stage III: sacrum Other: R buttock full thickness post surgical debridement for necrotizing soft tissue abscess  Last BM:  1/30  Height:   Ht Readings from Last 1 Encounters:  10/18/22 5\' 11"  (1.803 m)    Weight:   Wt Readings from Last 1 Encounters:  10/18/22 69.6 kg    BMI:  Body mass index is 21.4 kg/m.  Estimated Nutritional Needs:   Kcal:  2200-2400 kcals  Protein:  120-140 g  Fluid:  >/= 2L   Kerman Passey MS, RDN, LDN, CNSC Registered Dietitian 3 Clinical Nutrition RD Pager and On-Call Pager Number Located in [place]

## 2022-10-22 NOTE — Progress Notes (Signed)
[facility] PROGRESS NOTE  HILMAN KISSLING (DOB: 05-Nov-1985) NGE:[id number] PCP: Carrolyn Meiers, MD  Brief Narrative: 37 year old type I diabetic who was just discharged from [facility] on 1/9 after being admitted for sepsis and necrotizing soft tissue abscess of the right buttocks. He underwent surgical debridement, subsequent wound vac and was d/c'd to SNF w/ BID moist to dry dressings, and just discharged to rehab 1/28. Last post-op visit 1/23 noting  "wound appears to have a bed of healing granulation tissue without undrained fluid collections".    Presented to [facility] 1/29 after being found unresponsive and cool to touch in his wheel chair. Per mother report drank entire carton of milk and 1/2 box of cereal last night. EMS was called. Became pulseless in route to ER. IO placed, underwent 2 rounds of CPR before ROSC.    On arrival to ER.  Hypothermic 93.2 F Na 119, K >7.5 bicarb 7, BUN 75 cr 3.27, glucose > 1200, lactate 4.9, Ph 7.06, pco2 22 po2 505, anion gap 20 trop I negative BC sent Received 20 ml/kg crystalloid  Vanc and cefepime started.  NE infusion initiated.    Transferred to [facility] for care   Clinically improved since that time, exiting ICU and transferred to hospitalist service on 2/1. PT/OT consults pending. Glycemic control improving but remains erratic. Recurrently hyperkalemic from severe carb load 2/2.   Subjective: States he drank 6 jello/pudding cups last night that he thought were sugar free but weren't. No complaints this morning.  Objective: BP (!) 162/104   Pulse 97   Temp 98.9 F (37.2 C) (Oral)   Resp 19   Ht 5\' 11"  (1.803 m)   Wt 69.6 kg   SpO2 100%   BMI 21.40 kg/m   Gen: No distress Pulm: nonlabored  CV: RRR, no pitting edema Neuro: Alert and oriented. No new focal deficits.   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 Attributable to severe metabolic derangements, hyperkalemia, from DKA. Troponin not significantly elevated, no antecedent chest pain. - Maintain on cardiac monitoring.  DKA in poorly-controlled T1DM: HbA1c 7.6%, suspect that level of glycemic control is attributable to protracted stays in hospital/SNF. Glycemic excursions are improving.  - Emphasized carb-modified diet. Patient with severe hyperglycemia overnight from drinking 6 non-sugar free drinks. Subsequent significant hyperglycemia without anion gap.  - Continue titrating insulin, providing education. Levemir 15u daily, 4u + sensitive SSI with HS correction as ordered.  Hyperkalemia: Recurrent, due to severe hyperglycemia.  - Lokelma stat - Recheck this PM - Control glucose stat - Avoid ACE/ARB/MRA (stopped home lisinopril)  Acute hypoxic respiratory failure: Resolved  Acute metabolic encephalopathy: Due to DKA and postarrest. Resolved.  AKI on stage IIIb CKD: CrCl near baseline.   R hip infection s/p debridement at [facility], sacral pressure ulcer, POA:  - Local wound care per WOC. Family able to manage. - Local wound care, offloading as  able: "twice daily moist to dry dressing changes with Kerlix gauze covered by ABD pads for absorption" from Dr. Arnetha Courser note 10/12/22.  Both are healing by secondary intention. - Has been afebrile with normal WBC. Will plan on 5 days (final dose 2/2) of ceftriaxone based on S. pyogenes and sensitive  Klebsiella on Cx.   LFT elevation: Improving.   HTN:  - Continue home norvasc, labetalol  HLD:  - Continue home atorvastatin.  Multiple prior CVAs in 2019- with ambulatory issues thereafter, uses cane on good day. - PT/OT consulted. Currently will plan to try discharging home once stable. - Continue plavix, statin   Patrecia Pour, MD [facility] [email or web address] 10/22/2022, 2:01 PM

## 2022-10-23 DIAGNOSIS — I469 Cardiac arrest, cause unspecified: Secondary | ICD-10-CM | POA: Diagnosis not present

## 2022-10-23 LAB — CULTURE, BLOOD (ROUTINE X 2)
Culture: NO GROWTH
Culture: NO GROWTH
Special Requests: ADEQUATE
Special Requests: ADEQUATE

## 2022-10-23 LAB — COMPREHENSIVE METABOLIC PANEL
ALT: 47 U/L — ABNORMAL HIGH (ref 0–44)
AST: 32 U/L (ref 15–41)
Albumin: 2 g/dL — ABNORMAL LOW (ref 3.5–5.0)
Alkaline Phosphatase: 78 U/L (ref 38–126)
Anion gap: 6 (ref 5–15)
BUN: 64 mg/dL — ABNORMAL HIGH (ref 6–20)
CO2: 22 mmol/L (ref 22–32)
Calcium: 8.3 mg/dL — ABNORMAL LOW (ref 8.9–10.3)
Chloride: 106 mmol/L (ref 98–111)
Creatinine, Ser: 3.13 mg/dL — ABNORMAL HIGH (ref 0.61–1.24)
GFR, Estimated: 25 mL/min — ABNORMAL LOW (ref >60)
Glucose, Bld: 361 mg/dL — ABNORMAL HIGH (ref 70–99)
Potassium: 5.4 mmol/L — ABNORMAL HIGH (ref 3.5–5.1)
Sodium: 134 mmol/L — ABNORMAL LOW (ref 135–145)
Total Bilirubin: 0.4 mg/dL (ref 0.3–1.2)
Total Protein: 5.5 g/dL — ABNORMAL LOW (ref 6.5–8.1)

## 2022-10-23 LAB — CBC
HCT: 25.9 % — ABNORMAL LOW (ref 39.0–52.0)
Hemoglobin: 8.8 g/dL — ABNORMAL LOW (ref 13.0–17.0)
MCH: 30 pg (ref 26.0–34.0)
MCHC: 34 g/dL (ref 30.0–36.0)
MCV: 88.4 fL (ref 80.0–100.0)
Platelets: 329 10*3/uL (ref 150–400)
RBC: 2.93 MIL/uL — ABNORMAL LOW (ref 4.22–5.81)
RDW: 16.1 % — ABNORMAL HIGH (ref 11.5–15.5)
WBC: 9.5 10*3/uL (ref 4.0–10.5)
nRBC: 0 % (ref 0.0–0.2)

## 2022-10-23 LAB — GLUCOSE, CAPILLARY
Glucose-Capillary: 355 mg/dL — ABNORMAL HIGH (ref 70–99)
Glucose-Capillary: 292 mg/dL — ABNORMAL HIGH (ref 70–99)
Glucose-Capillary: 223 mg/dL — ABNORMAL HIGH (ref 70–99)
Glucose-Capillary: 285 mg/dL — ABNORMAL HIGH (ref 70–99)

## 2022-10-23 LAB — VITAMIN A: Vitamin A (Retinoic Acid): 46.5 ug/dL (ref 18.9–57.3)

## 2022-10-23 MED ORDER — SODIUM CHLORIDE 0.9 % IV SOLN: INTRAVENOUS | Status: DC

## 2022-10-23 MED ORDER — SODIUM ZIRCONIUM CYCLOSILICATE 10 G PO PACK
10.0000 g | PACK | Freq: Two times a day (BID) | ORAL | Status: DC
  Administered 2022-10-23 – 2022-10-25 (×5): 10 g via ORAL
  Filled 2022-10-23 (×5): qty 1

## 2022-10-23 NOTE — Progress Notes (Addendum)
[facility] PROGRESS NOTE  Nathaniel [name] (DOB: 1985-11-25) TIR:[id number] PCP: Carrolyn Meiers, MD  Brief Narrative: 37 year old type I diabetic who was just discharged from [facility] on 1/9 after being admitted for sepsis and necrotizing soft tissue abscess of the right buttocks. He underwent surgical debridement, subsequent wound vac and was d/c'd to SNF w/ BID moist to dry dressings, and just discharged to rehab 1/28. Last post-op visit 1/23 noting  "wound appears to have a bed of healing granulation tissue without undrained fluid collections".    Presented to [facility] 1/29 after being found unresponsive and cool to touch in his wheel chair. Per mother report drank entire carton of milk and 1/2 box of cereal last night. EMS was called. Became pulseless in route to ER. IO placed, underwent 2 rounds of CPR before ROSC.    On arrival to ER.  Hypothermic 93.2 F Na 119, K >7.5 bicarb 7, BUN 75 cr 3.27, glucose > 1200, lactate 4.9, Ph 7.06, pco2 22 po2 505, anion gap 20 trop I negative BC sent Received 20 ml/kg crystalloid  Vanc and cefepime started.  NE infusion initiated.    Transferred to [facility] for care   Clinically improved since that time, exiting ICU and transferred to hospitalist service on 2/1. PT/OT consults pending. Glycemic control improving but remains erratic. Recurrently hyperkalemic from severe carb load 2/2 and again on 2/3.  Subjective: Again drank 2 pudding cups that were not sugar free a bit after midnight or so. Doesn't feel bad, has no complaints.   Objective: BP (!) 156/94 (BP Location: Left Arm)   Pulse 94   Temp 98.4 F (36.9 C) (Oral)   Resp 19   Ht 5\' 11"  (1.803 m)   Wt 69.6 kg   SpO2 100%   BMI 21.40 kg/m   Gen: No distress Pulm: Clear, nonlabored CV: RRR, no MRG or edema Neuro: A/O  Assessment & Plan: Principal Problem:   Cardiac arrest, cause unspecified (HCC) Active Problems:   Septic shock (HCC)   Acute respiratory failure (HCC)    Abscess, gluteal, right   Gastroparesis   Type 1 diabetes mellitus with complication (HCC)   Hyponatremia   CVA (cerebral vascular accident) ([place])   Mixed hyperlipidemia   CKD stage 3 secondary to diabetes ([place])   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 Attributable to severe metabolic derangements, hyperkalemia, from DKA. Troponin not significantly elevated, no antecedent chest pain. - Maintain on cardiac monitoring.  DKA in poorly-controlled T1DM: HbA1c 7.6%, suspect that level of glycemic control is attributable to protracted stays in hospital/SNF. Glycemic excursions are improving but with spotty intake of high carbohydrate loads, spikes still occur.  - Again emphasized not to have a carb load, especially in the middle of the night when no insulin coverage is administered. He will follow this recommendation.    - Continue titrating insulin, providing education. Levemir 15u daily, 4u + sensitive SSI with HS correction as ordered. Overall stable but no middle of the night coverage which may be required if he continues eating at that time.   Hyperkalemia: Recurrent, due to severe hyperglycemia.  - Lokelma 10mg  BID today - Give insulin. Monitor telemetry (no peaked T waves) - If ECG changes or K rises, give loop diuretic, albuterol, calcium (level corrects  to 9.9mg /dl) - Avoid ACE/ARB/MRA (stopped home lisinopril)  Acute hypoxic respiratory failure: Resolved  Acute metabolic encephalopathy: Due to DKA and postarrest. Resolved.  AKI on stage IIIb CKD: CrCl near baseline initially, has worsened with continued osmotic diuresis. Note there were plans for dialysis a couple years ago.  - Start gentle IVF, monitor UOP and BMP.   R hip infection s/p  debridement at [facility], sacral pressure ulcer, POA:  - Local wound care per WOC. Family able to manage. - Local wound care, offloading as able: "twice daily moist to dry dressing changes with Kerlix gauze covered by ABD pads for absorption" from Dr. Arnetha Courser note 10/12/22.  Both are healing by secondary intention. - Has been afebrile with normal WBC. Completed 5 days of ceftriaxone based on S. pyogenes and sensitive Klebsiella on Cx.   LFT elevation: Improving.   HTN:  - Continue home norvasc, labetalol  HLD:  - Continue home atorvastatin.  Multiple prior CVAs in 2019- with ambulatory issues thereafter, uses cane on good day. - PT/OT consulted. Currently will plan to try discharging home once stable. - Continue plavix, statin   Patrecia Pour, MD [facility] [email or web address] 10/23/2022, 2:36 PM

## 2022-10-23 NOTE — Progress Notes (Signed)
PT Cancellation Note  Patient Details Name: Nathaniel [name] MRN: [id number] DOB: 09-30-1985   Cancelled Treatment:    Reason Eval/Treat Not Completed: Patient declined, no reason specified Declines to work with PT today. States he just got back into bed after staff assisted him to toilet. Will continue to follow and progress during admission.  Ellouise Newer 10/23/2022, 3:03 PM

## 2022-10-23 NOTE — Plan of Care (Signed)
  Problem: Education: Goal: Knowledge of General Education information will improve Description: Including pain rating scale, medication(s)/side effects and non-pharmacologic comfort measures Outcome: Progressing   Problem: Health Behavior/Discharge Planning: Goal: Ability to manage health-related needs will improve Outcome: Progressing   Problem: Clinical Measurements: Goal: Cardiovascular complication will be avoided Outcome: Progressing   Problem: Pain Managment: Goal: General experience of comfort will improve Outcome: Progressing   Problem: Safety: Goal: Ability to remain free from injury will improve Outcome: Progressing   Problem: Skin Integrity: Goal: Risk for impaired skin integrity will decrease Outcome: Progressing   

## 2022-10-24 DIAGNOSIS — I469 Cardiac arrest, cause unspecified: Secondary | ICD-10-CM | POA: Diagnosis not present

## 2022-10-24 LAB — VITAMIN C: Vitamin C: 0.7 mg/dL (ref 0.4–2.0)

## 2022-10-24 LAB — CBC
HCT: 24.3 % — ABNORMAL LOW (ref 39.0–52.0)
Hemoglobin: 8.1 g/dL — ABNORMAL LOW (ref 13.0–17.0)
MCH: 29.8 pg (ref 26.0–34.0)
MCHC: 33.3 g/dL (ref 30.0–36.0)
MCV: 89.3 fL (ref 80.0–100.0)
Platelets: 333 10*3/uL (ref 150–400)
RBC: 2.72 MIL/uL — ABNORMAL LOW (ref 4.22–5.81)
RDW: 16.1 % — ABNORMAL HIGH (ref 11.5–15.5)
WBC: 10.9 10*3/uL — ABNORMAL HIGH (ref 4.0–10.5)
nRBC: 0 % (ref 0.0–0.2)

## 2022-10-24 LAB — GLUCOSE, CAPILLARY
Glucose-Capillary: 290 mg/dL — ABNORMAL HIGH (ref 70–99)
Glucose-Capillary: 376 mg/dL — ABNORMAL HIGH (ref 70–99)
Glucose-Capillary: 298 mg/dL — ABNORMAL HIGH (ref 70–99)
Glucose-Capillary: 316 mg/dL — ABNORMAL HIGH (ref 70–99)

## 2022-10-24 LAB — COMPREHENSIVE METABOLIC PANEL
ALT: 41 U/L (ref 0–44)
AST: 29 U/L (ref 15–41)
Albumin: 2 g/dL — ABNORMAL LOW (ref 3.5–5.0)
Alkaline Phosphatase: 80 U/L (ref 38–126)
Anion gap: 7 (ref 5–15)
BUN: 70 mg/dL — ABNORMAL HIGH (ref 6–20)
CO2: 21 mmol/L — ABNORMAL LOW (ref 22–32)
Calcium: 7.9 mg/dL — ABNORMAL LOW (ref 8.9–10.3)
Chloride: 105 mmol/L (ref 98–111)
Creatinine, Ser: 3.15 mg/dL — ABNORMAL HIGH (ref 0.61–1.24)
GFR, Estimated: 25 mL/min — ABNORMAL LOW (ref >60)
Glucose, Bld: 409 mg/dL — ABNORMAL HIGH (ref 70–99)
Potassium: 4.9 mmol/L (ref 3.5–5.1)
Sodium: 133 mmol/L — ABNORMAL LOW (ref 135–145)
Total Bilirubin: 0.2 mg/dL — ABNORMAL LOW (ref 0.3–1.2)
Total Protein: 5.3 g/dL — ABNORMAL LOW (ref 6.5–8.1)

## 2022-10-24 MED ORDER — INSULIN ASPART 100 UNIT/ML IJ SOLN
5.0000 [IU] | Freq: Three times a day (TID) | INTRAMUSCULAR | Status: DC
  Administered 2022-10-24 – 2022-10-25 (×4): 5 [IU] via SUBCUTANEOUS

## 2022-10-24 MED ORDER — INSULIN DETEMIR 100 UNIT/ML ~~LOC~~ SOLN
18.0000 [IU] | Freq: Two times a day (BID) | SUBCUTANEOUS | Status: DC
  Administered 2022-10-24 – 2022-10-25 (×3): 18 [IU] via SUBCUTANEOUS
  Filled 2022-10-24 (×4): qty 0.18

## 2022-10-24 NOTE — Progress Notes (Signed)
[facility] PROGRESS NOTE  Nathaniel [name] (DOB: 09/09/1986) XNT:[id number] PCP: Carrolyn Meiers, MD  Brief Narrative: 37 year old type I diabetic who was just discharged from [facility] on 1/9 after being admitted for sepsis and necrotizing soft tissue abscess of the right buttocks. He underwent surgical debridement, subsequent wound vac and was d/c'd to SNF w/ BID moist to dry dressings, and just discharged to rehab 1/28. Last post-op visit 1/23 noting  "wound appears to have a bed of healing granulation tissue without undrained fluid collections".    Presented to [facility] 1/29 after being found unresponsive and cool to touch in his wheel chair. Per mother report drank entire carton of milk and 1/2 box of cereal last night. EMS was called. Became pulseless in route to ER. IO placed, underwent 2 rounds of CPR before ROSC.    On arrival to ER.  Hypothermic 93.2 F Na 119, K >7.5 bicarb 7, BUN 75 cr 3.27, glucose > 1200, lactate 4.9, Ph 7.06, pco2 22 po2 505, anion gap 20 trop I negative BC sent Received 20 ml/kg crystalloid  Vanc and cefepime started.  NE infusion initiated.    Transferred to [facility] for care   Clinically improved since that time, exiting ICU and transferred to hospitalist service on 2/1. PT/OT consults pending. Glycemic control improving but remains erratic. Recurrently hyperkalemic from severe carb load 2/2 and again on 2/3.  Subjective: Ate/drank more conscientiously overnight though glucose now >400! Mother at bedside, confirming history of hyperkalemia, has lokelma at home per nephrologist, Dr. Marval Regal. She feels like she's getting good instruction with wound care from RN.   Objective: BP 139/87 (BP Location: Left Arm)   Pulse (!) 102   Temp 98.2 F (36.8 C) (Oral)   Resp 18   Ht 5\' 11"  (1.803 m)   Wt 69.6 kg   SpO2 100%   BMI 21.40 kg/m   Gen: Chronically ill-appearing male in no distress Pulm: Clear, nonlabored  CV: Regular borderline tachycardia  without MRG or pitting edema GI: Soft, NT, ND, +BS  Neuro: Alert and oriented. No new focal deficits. Ext: Warm, no deformities Skin: Intact flaccid irregular blister just below left patella. No tenderness or erythema or induration. Wound not reexamined today.   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 Attributable to severe metabolic derangements, hyperkalemia, from DKA. Troponin not significantly elevated, no antecedent chest pain. - Maintain on cardiac monitoring. ECG repeated this morning appears like NSR, though T waves, to me, remain more prominent than I would prefer. QTc is 472msec.  DKA in poorly-controlled T1DM: HbA1c 7.6%, suspect that level of glycemic control is attributable to protracted stays in hospital/SNF. Glycemic excursions are improving but significant spikes still occur.  - Reinforced diabetic diet education today.   - Increase levemir to 18u BID, Increase mealtime to 5u TIDWC + sensitive SSI with HS correction as ordered.   Hyperkalemia: Recurrent, exacerbated  by hyperglycemia.  - Lokelma 10mg  BID to continue, pt has this at home actually. - Control CBG - If ECG changes or K rises, give loop diuretic, albuterol, calcium (level corrects to 9.9mg /dl) - Avoid ACE/ARB/MRA (stopped home lisinopril)  Acute hypoxic respiratory failure: Resolved  Acute metabolic  encephalopathy: Due to DKA and postarrest. Resolved.  AKI on stage IIIb CKD: CrCl near baseline initially, has worsened with continued osmotic diuresis. Note there were plans for dialysis a couple years ago.  - Cotninue gentle IVF, monitor UOP and BMP.   R hip infection s/p debridement at [facility], sacral pressure ulcer, POA:  - Local wound care per WOC. Mother states she is getting good instruction. Not completely ready to take over primary responsibility yet. At bedside for continued teaching today. - Local wound care, offloading as able: "twice daily moist to dry dressing changes with Kerlix gauze covered by ABD pads for absorption" from Dr. Arnetha Courser note 10/12/22.  Both are healing by secondary intention. - Has been afebrile with normal WBC. Completed 5 days of ceftriaxone based on S. pyogenes and sensitive Klebsiella on Cx.   LFT elevation: Improving.   HTN:  - Continue home norvasc, labetalol  HLD:  - Continue home atorvastatin.  Multiple prior CVAs in 2019- with ambulatory issues thereafter, uses cane on good day. - PT/OT consulted. Currently will plan to try discharging home once stable. - Continue plavix, statin   Patrecia Pour, MD [facility] [email or web address] 10/24/2022, 1:29 PM

## 2022-10-25 DIAGNOSIS — I469 Cardiac arrest, cause unspecified: Secondary | ICD-10-CM | POA: Diagnosis not present

## 2022-10-25 LAB — BASIC METABOLIC PANEL
Anion gap: 9 (ref 5–15)
BUN: 82 mg/dL — ABNORMAL HIGH (ref 6–20)
CO2: 17 mmol/L — ABNORMAL LOW (ref 22–32)
Calcium: 8.2 mg/dL — ABNORMAL LOW (ref 8.9–10.3)
Chloride: 103 mmol/L (ref 98–111)
Creatinine, Ser: 2.88 mg/dL — ABNORMAL HIGH (ref 0.61–1.24)
GFR, Estimated: 28 mL/min — ABNORMAL LOW (ref >60)
Glucose, Bld: 328 mg/dL — ABNORMAL HIGH (ref 70–99)
Potassium: 4.7 mmol/L (ref 3.5–5.1)
Sodium: 129 mmol/L — ABNORMAL LOW (ref 135–145)

## 2022-10-25 LAB — GLUCOSE, CAPILLARY: Glucose-Capillary: 359 mg/dL — ABNORMAL HIGH (ref 70–99)

## 2022-10-25 LAB — ZINC: Zinc: 55 ug/dL (ref 44–115)

## 2022-10-25 MED ORDER — INSULIN GLARGINE-YFGN 100 UNIT/ML ~~LOC~~ SOPN: 18.0000 [IU] | PEN_INJECTOR | Freq: Two times a day (BID) | SUBCUTANEOUS | 0 refills | Status: DC

## 2022-10-25 MED ORDER — SODIUM ZIRCONIUM CYCLOSILICATE 10 G PO PACK: 10.0000 g | PACK | Freq: Every day | ORAL | 0 refills | Status: DC

## 2022-10-25 MED ORDER — INSULIN ASPART 100 UNIT/ML FLEXPEN: PEN_INJECTOR | SUBCUTANEOUS | 0 refills | Status: DC

## 2022-10-25 NOTE — Progress Notes (Signed)
Patient is requesting lots of sweets throughout the nigh. Patient is encouraged not to ask different staff members for different foods due to his uncontrolled elevated blood sugar. Patient was educated about the balance of well controlled blood sugars and healing. Patient states, "I know, I told the doctor that and I can feel it in my leg when my sugars are elevated". Teachings were reinforced. Patient need improvement in learning.

## 2022-10-25 NOTE — Progress Notes (Signed)
PT Cancellation Note  Patient Details Name: Nathaniel [name] MRN: [id number] DOB: 1986-03-24   Cancelled Treatment:    Reason Eval/Treat Not Completed: Other (comment); Patient prepping to d/c with RN and family in the room.  Discussed stairs and educated on technique with assist from behind and step to sequence with bilateral rails.  Mother reports they have wheelchair and walker at home.  Noted HHPT set up.  Defer further PT to [facility] setting due to pt d/c today.   Reginia Naas 10/25/2022, 11:07 AM Magda Kiel, PT Acute Rehabilitation Services Office:[phone] 10/25/2022

## 2022-10-25 NOTE — Discharge Summary (Signed)
Physician Discharge Summary   Patient: Nathaniel [name] MRN: [id number] DOB: 04/28/1986  Admit date:     10/18/2022  Discharge date: 10/25/22  Discharge Physician: Patrecia Pour   PCP: Carrolyn Meiers, MD   Recommendations at discharge:  Continue intensive diabetic education including limitation of carbohydrates. This has been a significant challenge for the patient prior to admission, culminating in DKA > hyperkalemia > cardiac arrest. It has also been a large concern since admission, often requesting very high carbohydrate/simple sugar foods/beverages. Insulin regimen was increased, though further titration is likely to lead to intermittent hypoglycemia with which the patient has struggled in the past. Needs close PCP and endocrinology follow up.  I worry that the patient will continue disregarding the recommendation to moderate diet. He is well aware that this could definitely lead to another episode leading to cardiac arrest. Avoid ACE/ARB/ARNI/MRA in setting of recurrent hyperkalemia. Follow up with PCP in 1-2 weeks for recheck of wound, CBC, BMP. Will need continued nephrology follow up for stage IIIb/IV CKD complicated by hyperkalemia. Continue wound care as detailed below.  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 He underwent surgical debridement, subsequent wound vac and was d/c'd to SNF w/ BID moist to dry dressings, and just discharged to rehab 1/28. Last post-op visit 1/23 noting  "wound appears to have a bed of healing granulation tissue without undrained fluid collections".    Presented to [facility] 1/29 after being found unresponsive and cool to touch in his wheel chair. Per mother report drank entire carton of milk and 1/2 box of cereal last night. EMS was called. Became pulseless in route to ER. IO placed, underwent 2 rounds of CPR before ROSC.    On arrival to ER.  Hypothermic 93.2 F Na 119, K >7.5 bicarb 7, BUN 75 cr 3.27, glucose > 1200, lactate 4.9, Ph 7.06, pco2 22 po2 505, anion gap 20 trop I negative BC sent Received 20 ml/kg crystalloid  Vanc and cefepime started.  NE infusion initiated.    Transferred to [facility] for care    Clinically improved since that time, exiting ICU and transferred to hospitalist service on 2/1. PT/OT evaluations have be completed, recommending rehabilitation at home vs. SNF, though with the level of assistance at home he is safe to discharge there currently. Glycemic control has been improving but remains erratic despite upward insulin titration. Recurrently hyperkalemic in this setting, though that has durably resolved with improved glycemic control and lokelma. On 2/5, the patient has completed at course of antibiotics for wound infection. Wound looks good, and will be cared for by his mother who has received intensive education and training.   Assessment and Plan: OOH cardiac arrest: ROSC s/p 2 rounds of CPR. Attributable to severe metabolic derangements, hyperkalemia, from DKA. Troponin  not significantly elevated, no antecedent chest pain. - T wave changes improved, no ectopy on telemetry monitoring. QRS normal. QTc is 471msec.   DKA in poorly-controlled T1DM: HbA1c 7.6%, suspect that  level of glycemic control is attributable to protracted stays in hospital/SNF. Glycemic excursions are improving but significant spikes still occur.  - Reinforced diabetic diet education multiple times per day. I worry that the patient will not adhere to this.  - Increased levemir to 18u BID, Increase mealtime to 5u TIDWC + sensitive SSI with HS correction as ordered.    Hyperkalemia: Recurrent, exacerbated by hyperglycemia.  - Lokelma 10mg  daily to continue, pt has this at home as well, previously prescribed by nephrology. - Control CBG - Avoid ACE/ARB/MRA (stopped home lisinopril)   Acute hypoxic respiratory failure: Resolved   Acute metabolic encephalopathy: Due to DKA and postarrest. Resolved.   AKI on stage IIIb CKD:  - Follow up with nephrology, Dr. Marval Regal. Note there were plans for dialysis a couple years ago. Currently not strictly required. - CrCl has returned to baseline of 55ml/min. Normal UOP.    R hip infection s/p debridement at [facility], stage 3 sacral pressure ulcer, POA:  - Mother states she is getting good instruction, she appears capable of taking care of this wound. See picture from day of discharge below. No infection noted.  - Local wound care, offloading as able: "twice daily moist to dry dressing changes with Kerlix gauze covered by ABD pads for absorption" from Dr. Arnetha Courser note 10/12/22.  Both are healing by secondary intention. - Has been afebrile. Completed full course of ceftriaxone based on S. pyogenes and sensitive Klebsiella on Cx.    LFT elevation: Improving.    HTN:  - Continue home norvasc, labetalol. Stop ACE inhibitor.   HLD:  - Continue home atorvastatin.   Multiple prior CVAs in 2019- with ambulatory issues thereafter, uses cane on good day. - PT/OT consulted. Currently will plan to try discharging home - Continue plavix, statin  Consultants: PCCM, diabetic coordinator Procedures performed: A line 1/29, ETT 1/29 - 1/30, EEG  1/30 Disposition: Home Diet recommendation:  Carb modified diet DISCHARGE MEDICATION: Allergies as of 10/25/2022       Reactions   Fructose Other (See Comments)   Increase of blood sugar        Medication List     STOP taking these medications    DULoxetine 20 MG capsule Commonly known as: Cymbalta   lisinopril 40 MG tablet Commonly known as: ZESTRIL   Tresiba FlexTouch 100 UNIT/ML FlexTouch Pen Generic drug: insulin degludec       TAKE these medications    acetaminophen 325 MG tablet Commonly known as: TYLENOL Take 1-2 tablets (325-650 mg total) by mouth every 4 (four) hours as needed for mild pain.   amLODipine 10 MG tablet Commonly known as: NORVASC Take 1 tablet (10 mg total) by mouth daily. For blood pressure What changed: Another medication with the same name was removed. Continue taking this medication, and follow the directions you see here.   Aspirin Low Dose 81 MG tablet Generic drug: aspirin EC Take 81 mg by mouth every morning.   atorvastatin 80 MG tablet Commonly known as: LIPITOR Take 1 tablet (80 mg total) by mouth every evening. For stroke prevention   clopidogrel 75 MG tablet Commonly known as: PLAVIX Take 1 tablet (75 mg total) by mouth daily. For stroke prevention   Dexcom G6 Sensor Misc 1 each by Does not apply route every 14 (fourteen)  days.   Dexcom G6 Transmitter Misc Check sugar 4 times a day   ferrous sulfate 325 (65 FE) MG tablet Take 325 mg by mouth daily with breakfast.   GLUCERNA 1.5 CAL PO Take 237 mLs by mouth daily.   insulin aspart 100 UNIT/ML FlexPen Commonly known as: NOVOLOG Inject per sliding scale before meals: CBG 70 - 120: 0 units CBG 121 - 150: 1 unit CBG 151 - 200: 2 units CBG 201 - 250: 3 units CBG 251 - 300: 5 units CBG 301 - 350: 7 units CBG 351 - 400: 9 units CBG >400: 9 units and call doctor What changed: additional instructions   insulin glargine-yfgn 100 UNIT/ML Pen Commonly known as:  SEMGLEE Inject 18 Units into the skin 2 (two) times daily. What changed:  how much to take when to take this additional instructions   labetalol 100 MG tablet Commonly known as: NORMODYNE Take 1 tablet (100 mg total) by mouth 2 (two) times daily. For BP   multivitamin tablet Take 1 tablet by mouth daily.   Omnipod 5 G6 Pod (Gen 5) Misc 1 Device by Does not apply route every 3 (three) days.   OneTouch Ultra test strip Generic drug: glucose blood USE TO TEST BLOOD SUGAR 4 TIMES A DAY   Pen Needles 3/16" 31G X 5 MM Misc Four times day   polyethylene glycol 17 g packet Commonly known as: MIRALAX / GLYCOLAX Take 17 g by mouth daily.   sodium zirconium cyclosilicate 10 g Pack packet Commonly known as: LOKELMA Take 10 g by mouth daily.   Vitamin D (Ergocalciferol) 1.25 MG (50000 UNIT) Caps capsule Commonly known as: DRISDOL Take 1 capsule (50,000 Units total) by mouth once a week. What changed: when to take this               Discharge Care Instructions  (From admission, onward)           Start     Ordered   10/25/22 0000  Discharge wound care:       Comments: Twice daily: Clean sacral and right hip wounds with NS, apply saline moistened gauze to wound bed utilizing Q tip applicator to make sure entire wound bed is filled with moistened gauze, cover with dry gauze, ABD pad, and tape.   10/25/22 0815            Follow-up Information     [place] [place] Follow up.   Specialty: Home Health Services Why: [place]: Registered Nurse, Physiscal Therapy Contact information: [place] [address] [phone]         Carrolyn Meiers, MD Follow up.   Specialty: Internal Medicine Contact information: [place] [address] [phone]         Donato Heinz, MD Follow up.   Specialty: Nephrology Contact information: [place]  [address] [phone]                Discharge Exam: Danley Danker Weights   10/18/22 1330 10/18/22 1900  Weight: 52 kg 69.6 kg  BP (!) 145/92   Pulse 95   Temp 98.5 F (36.9 C) (Oral)   Resp 18   Ht 5\' 11"  (1.803 m)   Wt 69.6 kg   SpO2 100%   BMI 21.40 kg/m   Nontoxic male in no distress Clear, nonlabored RRR, no MRS or pitting edema Decreased muscle bulk Wounds as pictured (taken this morning)  Condition at discharge: stable  The results of significant diagnostics from this hospitalization (including imaging, microbiology, ancillary and laboratory) are listed below for reference.   Imaging Studies: EEG adult  Result Date: 2022-11-04 Lora Havens, MD     11/04/22  9:29 AM Patient Name: Nathaniel [name] MRN: [id number] Epilepsy Attending: Lora Havens Referring Physician/Provider: Erick Colace, NP Date: 10/18/2022 Duration: 26.39 mins Patient history: 37 year old male status post cardiac arrest.  EEG to evaluate for seizure. Level of alertness: lethargic AEDs during EEG study: None Technical aspects: This EEG study was done with scalp electrodes positioned according to the 10-20 International system of electrode placement. Electrical activity was reviewed with band pass filter of 1-70Hz , sensitivity of 7 uV/mm, display speed of 41mm/sec with a 60Hz  notched filter applied as appropriate. EEG data were recorded continuously and digitally stored.  Video monitoring was available and reviewed as appropriate. Description: EEG showed continuous generalized 5 to 6 Hz theta slowing admixed with 12 to 13 Hz beta activity. Hyperventilation and photic stimulation were not performed.   ABNORMALITY - Continuous slow, generalized IMPRESSION: This study is suggestive of moderate diffuse encephalopathy, nonspecific etiology but could be secondary to sedation.  No seizures or epileptiform discharges were seen throughout the recording. Lora Havens   CT Head Wo  Contrast  Result Date: 10/18/2022 CLINICAL DATA:  Neuro deficit, acute, stroke suspected EXAM: CT HEAD WITHOUT CONTRAST TECHNIQUE: Contiguous axial images were obtained from the base of the skull through the vertex without intravenous contrast. RADIATION DOSE REDUCTION: This exam was performed according to the departmental dose-optimization program which includes automated exposure control, adjustment of the mA and/or kV according to patient size and/or use of iterative reconstruction technique. COMPARISON:  CT head October 25, 21. FINDINGS: Brain: No evidence of acute large vascular territory infarction, hemorrhage, hydrocephalus, extra-axial collection or mass lesion/mass effect. Small remote right cerebellar lacunar infarcts. Vascular: No hyperdense vessel identified. Skull: No acute fracture. Sinuses/Orbits: Paranasal sinus mucosal thickening. No acute orbital findings. Other: Large left mastoid effusion with middle ear fluid. IMPRESSION: 1. No evidence of acute intracranial abnormality. 2. Large left mastoid effusion with middle ear fluid. Electronically Signed   By: Margaretha Sheffield M.D.   On: 10/18/2022 16:30   DG Chest Port 1 View  Result Date: 10/18/2022 CLINICAL DATA:  Cardiac arrest EXAM: PORTABLE CHEST 1 VIEW COMPARISON:  Chest radiograph 07/14/2020 FINDINGS: Monitoring leads overlie the patient. ET tube mid to distal trachea. Enteric tube courses inferior to the diaphragm. Stable cardiac and mediastinal contours. Lungs are clear. No pleural effusion or pneumothorax. IMPRESSION: ET tube mid to distal trachea. No acute cardiopulmonary process. Electronically Signed   By: Lovey Newcomer M.D.   On: 10/18/2022 13:30    Microbiology: Results for orders placed or performed during the hospital encounter of 10/18/22  Blood Culture (routine x 2)     Status: None   Collection Time: 10/18/22  1:19 PM   Specimen: BLOOD  Result Value Ref Range Status   Specimen Description BLOOD  Final   Special Requests    Final    BOTTLES DRAWN AEROBIC ONLY Blood Culture adequate volume   Culture   Final    NO GROWTH 5 DAYS Performed at [facility], [address], [place], [place] [address]    Report Status 10/23/2022 FINAL  Final  Blood Culture (routine x 2)     Status: None   Collection Time: 10/18/22  1:44 PM   Specimen: BLOOD  Result Value Ref  Range Status   Specimen Description BLOOD BLOOD LEFT ARM AEROBIC BOTTLE ONLY  Final   Special Requests   Final    Blood Culture adequate volume BOTTLES DRAWN AEROBIC ONLY   Culture   Final    NO GROWTH 5 DAYS Performed at [facility], [address] [place], [place] [address]    Report Status 10/23/2022 FINAL  Final  Resp panel by RT-PCR (RSV, Flu A&B, Covid) Anterior Nasal Swab     Status: None   Collection Time: 10/18/22  2:40 PM   Specimen: Anterior Nasal Swab  Result Value Ref Range Status   SARS Coronavirus 2 by RT PCR NEGATIVE NEGATIVE Final    Comment: (NOTE) SARS-CoV-2 target nucleic acids are NOT DETECTED.  The SARS-CoV-2 RNA is generally detectable in upper respiratory specimens during the acute phase of infection. The lowest concentration of SARS-CoV-2 viral copies this assay can detect is 138 copies/mL. A negative result does not preclude SARS-Cov-2 infection and should not be used as the sole basis for treatment or other patient management decisions. A negative result may occur with  improper specimen collection/handling, submission of specimen other than nasopharyngeal swab, presence of viral mutation(s) within the areas targeted by this assay, and inadequate number of viral copies(<138 copies/mL). A negative result must be combined with clinical observations, patient history, and epidemiological information. The expected result is Negative.  Fact Sheet for Patients:  EntrepreneurPulse.com.au  Fact Sheet for Healthcare Providers:  IncredibleEmployment.be  This test is no t yet approved or  cleared by the [place] FDA and  has been authorized for detection and/or diagnosis of SARS-CoV-2 by FDA under an Emergency Use Authorization (EUA). This EUA will remain  in effect (meaning this test can be used) for the duration of the COVID-19 declaration under Section 564(b)(1) of the Act, 21 U.S.C.section 360bbb-3(b)(1), unless the authorization is terminated  or revoked sooner.       Influenza A by PCR NEGATIVE NEGATIVE Final   Influenza B by PCR NEGATIVE NEGATIVE Final    Comment: (NOTE) The Xpert Xpress SARS-CoV-2/FLU/RSV plus assay is intended as an aid in the diagnosis of influenza from Nasopharyngeal swab specimens and should not be used as a sole basis for treatment. Nasal washings and aspirates are unacceptable for Xpert Xpress SARS-CoV-2/FLU/RSV testing.  Fact Sheet for Patients: EntrepreneurPulse.com.au  Fact Sheet for Healthcare Providers: IncredibleEmployment.be  This test is not yet approved or cleared by the [place] FDA and has been authorized for detection and/or diagnosis of SARS-CoV-2 by FDA under an Emergency Use Authorization (EUA). This EUA will remain in effect (meaning this test can be used) for the duration of the COVID-19 declaration under Section 564(b)(1) of the Act, 21 U.S.C. section 360bbb-3(b)(1), unless the authorization is terminated or revoked.     Resp Syncytial Virus by PCR NEGATIVE NEGATIVE Final    Comment: (NOTE) Fact Sheet for Patients: EntrepreneurPulse.com.au  Fact Sheet for Healthcare Providers: IncredibleEmployment.be  This test is not yet approved or cleared by the [place] FDA and has been authorized for detection and/or diagnosis of SARS-CoV-2 by FDA under an Emergency Use Authorization (EUA). This EUA will remain in effect (meaning this test can be used) for the duration of the COVID-19 declaration under Section 564(b)(1) of the Act, 21  U.S.C. section 360bbb-3(b)(1), unless the authorization is terminated or revoked.  Performed at [facility], [address], [place], [place] [address]   Urine Culture (for pregnant, neutropenic or urologic patients or patients with an indwelling urinary  catheter)     Status: None   Collection Time: 10/18/22  2:40 PM   Specimen: Urine, Clean Catch  Result Value Ref Range Status   Specimen Description   Final    URINE, CLEAN CATCH Performed at [facility], [address], [place], [place] [address]    Special Requests   Final    NONE Performed at [facility], [address], [place], [place] [address]    Culture   Final    NO GROWTH Performed at [facility], [place] [address], [place], [place] [address]    Report Status 10/19/2022 FINAL  Final  Aerobic Culture w Gram Stain (superficial specimen)     Status: None   Collection Time: 10/18/22  5:52 PM   Specimen: Wound  Result Value Ref Range Status   Specimen Description WOUND SACRAL  Final   Special Requests NONE  Final   Gram Stain   Final    ABUNDANT WBC PRESENT,BOTH PMN AND MONONUCLEAR FEW GRAM POSITIVE COCCI IN PAIRS Performed at [facility], [place] [address] [place], [place] [address]    Culture   Final    MODERATE GROUP A STREP (S.PYOGENES) ISOLATED Beta hemolytic streptococci are predictably susceptible to penicillin and other beta lactams. Susceptibility testing not routinely performed. RARE KLEBSIELLA PNEUMONIAE    Report Status 10/21/2022 FINAL  Final   Organism ID, Bacteria KLEBSIELLA PNEUMONIAE  Final      Susceptibility   Klebsiella pneumoniae - MIC*    AMPICILLIN >=32 RESISTANT Resistant     CEFAZOLIN <=4 SENSITIVE Sensitive     CEFEPIME <=0.12 SENSITIVE Sensitive     CEFTAZIDIME <=1 SENSITIVE Sensitive     CEFTRIAXONE <=0.25 SENSITIVE Sensitive     CIPROFLOXACIN <=0.25 SENSITIVE Sensitive     GENTAMICIN <=1 SENSITIVE Sensitive     IMIPENEM <=0.25 SENSITIVE Sensitive     TRIMETH/SULFA <=20  SENSITIVE Sensitive     AMPICILLIN/SULBACTAM 8 SENSITIVE Sensitive     PIP/TAZO <=4 SENSITIVE Sensitive     * RARE KLEBSIELLA PNEUMONIAE  Aerobic Culture w Gram Stain (superficial specimen)     Status: None   Collection Time: 10/18/22  5:53 PM   Specimen: Wound  Result Value Ref Range Status   Specimen Description WOUND RIGHT HIP  Final   Special Requests NONE  Final   Gram Stain   Final    ABUNDANT WBC PRESENT,BOTH PMN AND MONONUCLEAR MODERATE GRAM POSITIVE COCCI IN PAIRS    Culture   Final    FEW GROUP A STREP (S.PYOGENES) ISOLATED Beta hemolytic streptococci are predictably susceptible to penicillin and other beta lactams. Susceptibility testing not routinely performed. WITHIN MIXED ORGANISMS Performed at [facility], [place] [address] [place], [place] [address]    Report Status 10/20/2022 FINAL  Final  MRSA Next Gen by PCR, Nasal     Status: None   Collection Time: 10/18/22  6:40 PM   Specimen: Buttocks; Nasal Swab  Result Value Ref Range Status   MRSA by PCR Next Gen NOT DETECTED NOT DETECTED Final    Comment: (NOTE) The GeneXpert MRSA Assay (FDA approved for NASAL specimens only), is one component of a comprehensive MRSA colonization surveillance program. It is not intended to diagnose MRSA infection nor to guide or monitor treatment for MRSA infections. Test performance is not FDA approved in patients less than 38 years old. Performed at [facility], [place] [address] [place], [place] [address]     Labs: CBC: Recent  Labs  Lab 10/18/22 1344 10/18/22 1818 10/20/22 0131 10/21/22 0507 10/22/22 0428 10/23/22 0149 10/24/22 0146  WBC 5.2   < > 7.3 6.2 8.4 9.5 10.9*  NEUTROABS 3.5  --   --   --   --   --   --   HGB 8.3*   < > 8.4* 9.4* 9.0* 8.8* 8.1*  HCT 31.2*   < > 24.9* 28.3* 26.6* 25.9* 24.3*  MCV 112.6*   < > 86.5 87.3 88.4 88.4 89.3  PLT 300   < > 306 292 281 329 333   < > = values in this interval not displayed.   Basic Metabolic Panel: Recent  Labs  Lab 10/22/22 0428 10/22/22 1204 10/23/22 0149 10/24/22 0146 10/25/22 0150  NA 130* 136 134* 133* 129*  K 5.2* 5.0 5.4* 4.9 4.7  CL 103 106 106 105 103  CO2 20* 21* 22 21* 17*  GLUCOSE 356* 169* 361* 409* 328*  BUN 57* 56* 64* 70* 82*  CREATININE 2.75* 2.76* 3.13* 3.15* 2.88*  CALCIUM 8.4* 8.6* 8.3* 7.9* 8.2*   Liver Function Tests: Recent Labs  Lab 10/20/22 0131 10/21/22 0507 10/22/22 0428 10/23/22 0149 10/24/22 0146  AST 39 26 18 32 29  ALT 99* 65* 49* 47* 41  ALKPHOS 77 83 80 78 80  BILITOT 0.3 0.5 0.3 0.4 0.2*  PROT 5.2* 5.6* 5.7* 5.5* 5.3*  ALBUMIN 1.9* 1.9* 2.1* 2.0* 2.0*   CBG: Recent Labs  Lab 10/23/22 2125 10/24/22 0822 10/24/22 1147 10/24/22 1656 10/24/22 2110  GLUCAP 285* 290* 376* 298* 316*    Discharge time spent: greater than 30 minutes.  Signed: Patrecia Pour, MD [facility] 10/25/2022

## 2022-10-25 NOTE — Progress Notes (Signed)
Occupational Therapy Treatment Patient Details Name: Nathaniel [name] MRN: [id number] DOB: [birth year] Today's Date: 10/25/2022   History of present illness Patient has a history of diabetes and kidney disease.  He has recently been admitted to [facility] with sepsis and right buttocks abscess needing surgery.  Patient became obtunded and then arrested and with the paramedics.  They perform appropriate ACLS.  When patient arrived to the emergency department he was pulseless. per patient reports he has had 2 strokes years ago and that his r side of his body is weaker.   OT comments  Pt progressing towards goals, completing toilet transfer with min guard A using RW, min guard for pericare in standing. Pt min guard A for bed mobility, prefers L sidelying position due to RLE wound. HR up to 120 with mobility and standing ADL. Pt presenting with impairments listed below, will follow acutely. Continue to recommend HHOT at d/c.   Recommendations for follow up therapy are one component of a multi-disciplinary discharge planning process, led by the attending physician.  Recommendations may be updated based on patient status, additional functional criteria and insurance authorization.    Follow Up Recommendations  Home health OT     Assistance Recommended at Discharge Intermittent Supervision/Assistance  Patient can return home with the following  A little help with walking and/or transfers;A lot of help with bathing/dressing/bathroom;Assistance with cooking/housework;Direct supervision/assist for medications management;Assist for transportation   Equipment Recommendations  None recommended by OT    Recommendations for Other Services PT consult    Precautions / Restrictions Precautions Precautions: Fall Precaution Comments: reports hx of falls Restrictions Weight Bearing Restrictions: No       Mobility Bed Mobility Overal bed mobility: Needs Assistance Bed Mobility: Supine to Sit,  Sit to Supine     Supine to sit: Min guard Sit to supine: Min guard        Transfers Overall transfer level: Needs assistance Equipment used: Rolling walker (2 wheels) Transfers: Sit to/from Stand Sit to Stand: Min assist                 Balance Overall balance assessment: Needs assistance Sitting-balance support: Feet supported, No upper extremity supported Sitting balance-Leahy Scale: Good     Standing balance support: Bilateral upper extremity supported Standing balance-Leahy Scale: Fair                             ADL either performed or assessed with clinical judgement   ADL Overall ADL's : Needs assistance/impaired                     Lower Body Dressing: Maximal assistance;Sitting/lateral leans Lower Body Dressing Details (indicate cue type and reason): to pull up socks Toilet Transfer: Min guard;Regular Toilet;Rolling walker (2 wheels) Toilet Transfer Details (indicate cue type and reason): simulated via functional mobility Toileting- Clothing Manipulation and Hygiene: Min guard Toileting - Clothing Manipulation Details (indicate cue type and reason): pericare in standing     Functional mobility during ADLs: Min guard;Rolling walker (2 wheels)      Extremity/Trunk Assessment Upper Extremity Assessment Upper Extremity Assessment: Overall WFL for tasks assessed   Lower Extremity Assessment Lower Extremity Assessment: Defer to PT evaluation        Vision   Vision Assessment?: No apparent visual deficits   Perception Perception Perception: Not tested   Praxis Praxis Praxis: Not tested    Cognition Arousal/Alertness: Awake/alert Behavior During Therapy:  WFL for tasks assessed/performed Overall Cognitive Status: Impaired/Different from baseline Area of Impairment: Safety/judgement, Problem solving                         Safety/Judgement: Decreased awareness of deficits   Problem Solving: Slow processing,  Requires verbal cues, Requires tactile cues General Comments: slow processing/initation, follows commands appropriately        Exercises      Shoulder Instructions       General Comments VSS on RA    Pertinent Vitals/ Pain       Pain Assessment Pain Assessment: No/denies pain  Home Living                                          Prior Functioning/Environment              Frequency  Min 2X/week        Progress Toward Goals  OT Goals(current goals can now be found in the care plan section)  Progress towards OT goals: Progressing toward goals  Acute Rehab OT Goals Patient Stated Goal: none stated OT Goal Formulation: With patient Time For Goal Achievement: 11/04/22 Potential to Achieve Goals: Good ADL Goals Pt Will Perform Lower Body Bathing: with modified independence;sit to/from stand;with adaptive equipment Pt Will Perform Lower Body Dressing: with modified independence;with adaptive equipment;sit to/from stand Pt Will Transfer to Toilet: with supervision;ambulating Pt Will Perform Toileting - Clothing Manipulation and hygiene: with supervision;sit to/from stand  Plan Discharge plan remains appropriate;Frequency remains appropriate    Co-evaluation                 AM-PAC OT "6 Clicks" Daily Activity     Outcome Measure   Help from another person eating meals?: None Help from another person taking care of personal grooming?: A Little Help from another person toileting, which includes using toliet, bedpan, or urinal?: A Lot Help from another person bathing (including washing, rinsing, drying)?: A Lot Help from another person to put on and taking off regular upper body clothing?: A Little Help from another person to put on and taking off regular lower body clothing?: A Lot 6 Click Score: 16    End of Session Equipment Utilized During Treatment: Gait belt;Rolling walker (2 wheels)  OT Visit Diagnosis: Unsteadiness on feet  (R26.81);Muscle weakness (generalized) (M62.81)   Activity Tolerance Patient tolerated treatment well   Patient Left in bed;with call bell/phone within reach   Nurse Communication Mobility status;Other (comment) (confirmed pt can have diet soda)        Time: 1884-1660 OT Time Calculation (min): 18 min  Charges: OT General Charges $OT Visit: 1 Visit OT Treatments $Self Care/Home Management : 8-22 mins  Renaye Rakers, OTD, OTR/L Secure[employer] Preferred Acute Rehab [phone] - [place] 10/25/2022, 9:38 AM

## 2022-10-25 NOTE — Plan of Care (Signed)
  Problem: Education: Goal: Knowledge of General Education information will improve Description: Including pain rating scale, medication(s)/side effects and non-pharmacologic comfort measures Outcome: Adequate for Discharge   Problem: Health Behavior/Discharge Planning: Goal: Ability to manage health-related needs will improve Outcome: Adequate for Discharge   Problem: Clinical Measurements: Goal: Ability to maintain clinical measurements within normal limits will improve Outcome: Adequate for Discharge Goal: Will remain free from infection Outcome: Adequate for Discharge Goal: Diagnostic test results will improve Outcome: Adequate for Discharge Goal: Respiratory complications will improve Outcome: Adequate for Discharge Goal: Cardiovascular complication will be avoided Outcome: Adequate for Discharge   Problem: Activity: Goal: Risk for activity intolerance will decrease Outcome: Adequate for Discharge   Problem: Nutrition: Goal: Adequate nutrition will be maintained Outcome: Adequate for Discharge   Problem: Coping: Goal: Level of anxiety will decrease Outcome: Adequate for Discharge   Problem: Elimination: Goal: Will not experience complications related to bowel motility Outcome: Adequate for Discharge Goal: Will not experience complications related to urinary retention Outcome: Adequate for Discharge   Problem: Pain Managment: Goal: General experience of comfort will improve Outcome: Adequate for Discharge   Problem: Safety: Goal: Ability to remain free from injury will improve Outcome: Adequate for Discharge   Problem: Skin Integrity: Goal: Risk for impaired skin integrity will decrease Outcome: Adequate for Discharge   Problem: Education: Goal: Ability to manage disease process will improve Outcome: Adequate for Discharge   Problem: Cardiac: Goal: Ability to achieve and maintain adequate cardiopulmonary perfusion will improve Outcome: Adequate for  Discharge   Problem: Neurologic: Goal: Promote progressive neurologic recovery Outcome: Adequate for Discharge   Problem: Skin Integrity: Goal: Risk for impaired skin integrity will be minimized. Outcome: Adequate for Discharge   Problem: Education: Goal: Ability to describe self-care measures that may prevent or decrease complications (Diabetes Survival Skills Education) will improve Outcome: Adequate for Discharge Goal: Individualized Educational Video(s) Outcome: Adequate for Discharge   Problem: Coping: Goal: Ability to adjust to condition or change in health will improve Outcome: Adequate for Discharge   Problem: Fluid Volume: Goal: Ability to maintain a balanced intake and output will improve Outcome: Adequate for Discharge   Problem: Health Behavior/Discharge Planning: Goal: Ability to identify and utilize available resources and services will improve Outcome: Adequate for Discharge Goal: Ability to manage health-related needs will improve Outcome: Adequate for Discharge   Problem: Metabolic: Goal: Ability to maintain appropriate glucose levels will improve Outcome: Adequate for Discharge   Problem: Nutritional: Goal: Maintenance of adequate nutrition will improve Outcome: Adequate for Discharge Goal: Progress toward achieving an optimal weight will improve Outcome: Adequate for Discharge   Problem: Skin Integrity: Goal: Risk for impaired skin integrity will decrease Outcome: Adequate for Discharge   Problem: Tissue Perfusion: Goal: Adequacy of tissue perfusion will improve Outcome: Adequate for Discharge

## 2022-10-26 ENCOUNTER — Telehealth: Payer: Self-pay

## 2022-10-26 ENCOUNTER — Other Ambulatory Visit: Payer: Self-pay | Admitting: Endocrinology

## 2022-10-26 DIAGNOSIS — E1065 Type 1 diabetes mellitus with hyperglycemia: Secondary | ICD-10-CM

## 2022-11-12 NOTE — Telephone Encounter (Signed)
error 

## 2022-12-01 ENCOUNTER — Other Ambulatory Visit: Payer: Self-pay | Admitting: Endocrinology

## 2022-12-01 ENCOUNTER — Encounter: Payer: Self-pay | Admitting: Endocrinology

## 2022-12-01 ENCOUNTER — Ambulatory Visit (INDEPENDENT_AMBULATORY_CARE_PROVIDER_SITE_OTHER): Payer: Medicare HMO | Admitting: Endocrinology

## 2022-12-01 VITALS — BP 118/86 | HR 96 | Ht 71.0 in | Wt 147.6 lb

## 2022-12-01 DIAGNOSIS — E1065 Type 1 diabetes mellitus with hyperglycemia: Secondary | ICD-10-CM | POA: Diagnosis not present

## 2022-12-01 LAB — POCT GLUCOSE (DEVICE FOR HOME USE): POC Glucose: 199 mg/dl — AB (ref 70–99)

## 2022-12-01 MED ORDER — OMNIPOD 5 DEXG7G6 PODS GEN 5 MISC: 1.0000 | 3 refills | Status: DC

## 2022-12-01 MED ORDER — TRESIBA FLEXTOUCH 100 UNIT/ML ~~LOC~~ SOPN: 28.0000 [IU] | PEN_INJECTOR | Freq: Every day | SUBCUTANEOUS | 1 refills | Status: DC

## 2022-12-01 MED ORDER — OMNIPOD 5 DEXG7G6 INTRO GEN 5 KIT: 1.0000 | PACK | Freq: Once | 0 refills | Status: AC

## 2022-12-01 NOTE — Progress Notes (Signed)
Patient ID: Nathaniel [name], male   DOB: 01-17-1986, 37 y.o.   MRN: [id number]           Reason for Appointment : Follow-up for Type 1 Diabetes  History of Present Illness           Diagnosis: Type 1 diabetes mellitus, date of diagnosis:  ?  2004        Previous history:  He has had recurrent hospitalizations for ketoacidosis especially in 2017 and 2018, last episode was in 01/2018 Overall has had consistently poor control with A1c as high as 16 In the past he has required large doses of at least basal insulin, taking up to 200 units of Lantus daily at some point  Recent history:   INSULIN regimen is: Novolog usually 4-5/7 units at meals  Semglee insulin 18 units morning--14 units evening daily  His A1c is last 7.6  Has not been seen since 8/23  Current management, blood sugar patterns and problems identified:    He recently was admitted for severe ketoacidosis associated with cardiac arrest  In the hospital he was changed from [name] to glargine insulin twice a day and appears to be taking much higher doses than before  I will are taking about the same amount of NovoLog Unclear if his blood sugars are consistently controlled and how often he is monitoring his blood sugars He stopped using the Dexcom about a month or so ago and was not able to make it function and needs help with this  Again most of his diabetes management is being done by his mother She gives him the insulin right after eating and will not do any insulin the blood sugar is normal  Although he was told to take 18 units of glargine insulin in the evening he was apparently getting somewhat low overnight and is taking mostly 14, making some adjustment based on the blood sugar at the time of injection Today glucose in the office is 199 in the afternoon No hypoglycemia during the day   Glucose monitoring: With his mother's glucose monitor  Readings by recall   PRE-MEAL Fasting Lunch Dinner Bedtime Overall   Glucose range: 49-120      Mean/median:        POST-MEAL PC Breakfast PC Lunch PC Dinner  Glucose range:   200+  Mean/median:       Interpretation of the Dexcom sensor download on previous visit  GMI 7.8, previously 7.3  Generally blood sugars are at or above the 180 target most of the time except in the afternoons  HIGHEST blood sugars are overall late at night generally between 11 PM-2 AM  Moderate variability is present especially after about 6 PM  HYPOGLYCEMIA is very infrequent and only occurred in the first week around 6-7 AM and 1 episode around 8-9 PM  HYPERGLYCEMIC episodes are occurring frequently in the evenings at variable times but most frequently late in the evening before midnight OVERNIGHT blood sugars are usually starting of high around midnight and progressively declining till about 1 or 2 PM with marked variability Time in range is slightly better at 45 compared to 39% Previous 3  Statistics:  CGM use % of time   2-week average/GV 187/41  Time in range     45   %  % Time Above 180 31  % Time above 250 19  % Time Below 70 5     Previously:   CGM use % of time 92  2-week  average/GV 166/56  Time in range    39    %  % Time Above 180 22  % Time above 250 21  % Time Below 70 6+12     CGM use % of time 100  2-week average/GV 230/40  Time in range 35, previously 43  % Time Above 180 21+41  % Time above 250   % Time Below 70 2    Hypoglycemia:  As above Symptoms of hypoglycemia: Feeling tired, sweaty, slurred speech, weakness Treatment of hypoglycemia: Juice usually, sometimes glucose tablets, usually 2 at a time     Self-care: The diet that the patient has been following is: None  Mealtimes are: Very variable              Dietician consultation: Most recent: 06/2018     CDE consultation: Years ago  Diabetes labs:  Lab Results  Component Value Date   HGBA1C 7.6 (H) 10/19/2022   HGBA1C 9.4 01/21/2022   HGBA1C 11.2 (A) 11/23/2021   Lab  Results  Component Value Date   LDLCALC 138 (H) 05/18/2021   CREATININE 2.88 (H) 10/25/2022    No results found for: "MICRALBCREAT"  Wt Readings from Last 3 Encounters:  12/01/22 147 lb 9.6 oz (67 kg)  10/18/22 153 lb 7 oz (69.6 kg)  09/12/22 137 lb (62.1 kg)     Allergies as of 12/01/2022       Reactions   Fructose Other (See Comments)   Increase of blood sugar        Medication List        Accurate as of December 01, 2022 11:59 PM. If you have any questions, ask your nurse or doctor.          STOP taking these medications    insulin glargine-yfgn 100 UNIT/ML Pen Commonly known as: SEMGLEE Stopped by: Elayne Snare, MD       TAKE these medications    acetaminophen 325 MG tablet Commonly known as: TYLENOL Take 1-2 tablets (325-650 mg total) by mouth every 4 (four) hours as needed for mild pain.   amLODipine 10 MG tablet Commonly known as: NORVASC Take 1 tablet (10 mg total) by mouth daily. For blood pressure   Aspirin Low Dose 81 MG tablet Generic drug: aspirin EC Take 81 mg by mouth every morning.   atorvastatin 80 MG tablet Commonly known as: LIPITOR Take 1 tablet (80 mg total) by mouth every evening. For stroke prevention   clopidogrel 75 MG tablet Commonly known as: PLAVIX Take 1 tablet (75 mg total) by mouth daily. For stroke prevention   Dexcom G6 Sensor Misc 1 each by Does not apply route every 14 (fourteen) days.   Dexcom G6 Transmitter Misc Check sugar 4 times a day   ferrous sulfate 325 (65 FE) MG tablet Take 325 mg by mouth daily with breakfast.   GLUCERNA 1.5 CAL PO Take 237 mLs by mouth daily.   insulin aspart 100 UNIT/ML FlexPen Commonly known as: NOVOLOG Inject per sliding scale before meals: CBG 70 - 120: 0 units CBG 121 - 150: 1 unit CBG 151 - 200: 2 units CBG 201 - 250: 3 units CBG 251 - 300: 5 units CBG 301 - 350: 7 units CBG 351 - 400: 9 units CBG >400: 9 units and call doctor   labetalol 100 MG tablet Commonly known as:  NORMODYNE Take 1 tablet (100 mg total) by mouth 2 (two) times daily. For BP   multivitamin tablet Take 1  tablet by mouth daily.   Omnipod 5 G6 Pods (Gen 5) Misc 1 Device by Does not apply route every 3 (three) days. What changed: Another medication with the same name was added. Make sure you understand how and when to take each. Changed by: Elayne Snare, MD   Omnipod 5 G6 Intro (Gen 5) Kit 1 kit by Does not apply route once for 1 dose. What changed: You were already taking a medication with the same name, and this prescription was added. Make sure you understand how and when to take each. Changed by: Elayne Snare, MD   OneTouch Ultra test strip Generic drug: glucose blood USE TO TEST BLOOD SUGAR 4 TIMES A DAY   Pen Needles 3/16" 31G X 5 MM Misc Four times day   polyethylene glycol 17 g packet Commonly known as: MIRALAX / GLYCOLAX Take 17 g by mouth daily.   sodium zirconium cyclosilicate 10 g Pack packet Commonly known as: LOKELMA Take 10 g by mouth daily.   [name] FlexTouch 100 UNIT/ML FlexTouch Pen Generic drug: insulin degludec Inject 28 Units into the skin daily. Started by: Elayne Snare, MD   Vitamin D (Ergocalciferol) 1.25 MG (50000 UNIT) Caps capsule Commonly known as: DRISDOL Take 1 capsule (50,000 Units total) by mouth once a week. What changed: when to take this        Allergies:  Allergies  Allergen Reactions   Fructose Other (See Comments)    Increase of blood sugar     Past Medical History:  Diagnosis Date   Diabetes mellitus    lantus/novolog   Hyperlipidemia    Hypertension    Marijuana abuse    occaisionally   Noncompliance with medication regimen    Stroke ([place]) 2020   Tobacco abuse    5/day    Past Surgical History:  Procedure Laterality Date   AV FISTULA PLACEMENT Left 01/08/2021   Procedure: LEFT ARM ARTERIOVENOUS (AV) FISTULA CREATION;  Surgeon: [name], MD;  Location: [facility];  Service: Vascular;  Laterality: Left;   EYE  SURGERY      Family History  Problem Relation Age of Onset   Diabetes Father    Kidney failure Father        last 41 mnths of life   Hypertension Mother    Diabetes Maternal Grandmother    Pancreatic cancer Maternal Grandfather    Diabetes Paternal Grandfather     Social History:  reports that he has been smoking cigarettes. He has a 4.00 pack-year smoking history. He has never used smokeless tobacco. He reports current drug use. Drug: Marijuana. He reports that he does not drink alcohol.      Review of Systems      Lipids: After his CVA in June 2019 he was started on Lipitor 80 mg Last LDL from PCP is 72  Lab Results  Component Value Date   CHOL 204 (H) 05/18/2021   HDL 37.60 (L) 05/18/2021   LDLCALC 138 (H) 05/18/2021   TRIG 142.0 05/18/2021   CHOLHDL 5 05/18/2021   HYPERTENSION: Has been on treatment for several years from nephrologist  BP Readings from Last 3 Encounters:  12/01/22 118/86  10/25/22 (!) 143/83  09/12/22 (!) 164/96    Has chronic kidney disease, seen by nephrologist regularly and has not been advised dialysis  Lab Results  Component Value Date   CREATININE 2.88 (H) 10/25/2022   CREATININE 3.15 (H) 10/24/2022   CREATININE 3.13 (H) 10/23/2022   He has had a  thyroid nodule on the right side, ultrasound has been ordered a couple of times but he has not gone for this  Lab Results  Component Value Date   TSH 0.762 07/14/2020   Last eye exam with Dr Katy Fitch 4/23   Physical Examination:  BP 118/86 (BP Location: Left Arm, Patient Position: Sitting, Cuff Size: Normal)   Pulse 96   Ht '5\' 11"'$  (1.803 m)   Wt 147 lb 9.6 oz (67 kg)   SpO2 100%   BMI 20.59 kg/m     ASSESSMENT:  Diabetes type 1, persistently poorly controlled  A1c is 7.6 recently  Current regimen is glargine insulin twice a day and NovoLog at meals  Recent history with his hospitalization was reviewed  His blood sugars are difficult to assess because of not having his  Dexcom Also no record of his blood sugars recently is available  Unclear why he went into ketoacidosis recently and likely was requiring more insulin including basal since he is now on 32 units total of insulin instead of 16-18 that he had taken before  Also did not follow-up after last August when he was supposed to look into the insulin pump Likely has higher readings after meals and is still reluctant to take full coverage for his meals even though he is dosing the insulin postprandially when he knows how much he has eaten His insulin management has been done by his mother again    PLAN:  He was shown in the office now how to restart the Dexcom sensor Again discussed the benefits of the closed-loop insulin pump system and he agrees to try this after training  In the meantime he can switch back to [name] and start with 28 units daily at suppertime This will be adjusted further based on his fasting reading  He likely needs 6 to 8 units NovoLog when he is eating a full meal and only 3 or 4 when he is eating part of the meal or less carbohydrates  Also may need to cover any large snacks with 3 to 4 units of insulin To make corrections for the high sugars taking 2 to 3 units when blood sugars are over 200  More regular follow-up  Patient Instructions  Novolog 6-8 for full meals  Tresiba 28 units daily at supper   Elayne Snare 12/02/2022, 8:21 AM      Note: This note was prepared with Dragon voice recognition system technology. Any transcriptional errors that result from this process are unintentional.

## 2022-12-01 NOTE — Patient Instructions (Signed)
Novolog 6-8 for full meals  Tresiba 28 units daily at supper

## 2022-12-02 ENCOUNTER — Other Ambulatory Visit (HOSPITAL_COMMUNITY): Payer: Self-pay

## 2022-12-02 NOTE — Telephone Encounter (Signed)
PA previously denied by plan, please see previous encounter 05/2022

## 2022-12-03 ENCOUNTER — Telehealth: Payer: Self-pay

## 2022-12-03 NOTE — Telephone Encounter (Signed)
RX For Omnipod:  Alternative Requested:NOT COVERED.   PA previously denied on 05/2022 per Prior Auth Team

## 2022-12-07 ENCOUNTER — Other Ambulatory Visit (HOSPITAL_COMMUNITY): Payer: Self-pay

## 2022-12-14 ENCOUNTER — Other Ambulatory Visit: Payer: Self-pay | Admitting: Endocrinology

## 2022-12-14 DIAGNOSIS — E1065 Type 1 diabetes mellitus with hyperglycemia: Secondary | ICD-10-CM

## 2022-12-20 ENCOUNTER — Other Ambulatory Visit (HOSPITAL_COMMUNITY): Payer: Self-pay

## 2022-12-20 NOTE — Telephone Encounter (Signed)
I ran test claims for Omnipod, [place] and [place]. They all say the same thing. Not on formlary, Product/service not covered. Based on the denial letter when we requested the PA for the Omnipod last year, it looks like it may just not be covered under part D. It doesn't say that, but the alternatives it lists are all insulins.

## 2023-01-05 ENCOUNTER — Other Ambulatory Visit: Payer: Self-pay | Admitting: Endocrinology

## 2023-01-05 DIAGNOSIS — E1065 Type 1 diabetes mellitus with hyperglycemia: Secondary | ICD-10-CM

## 2023-01-05 MED ORDER — OMNIPOD 5 DEXG7G6 INTRO GEN 5 KIT: 1.0000 | PACK | Freq: Once | 0 refills | Status: AC

## 2023-01-05 MED ORDER — OMNIPOD 5 DEXG7G6 PODS GEN 5 MISC: 1.0000 | 3 refills | Status: DC

## 2023-01-13 ENCOUNTER — Other Ambulatory Visit (HOSPITAL_COMMUNITY): Payer: Self-pay

## 2023-01-13 NOTE — Telephone Encounter (Signed)
Geannie Risen, we received 2 faxes from [facility] 4/17 wanting information for a PA or letting us know a PA needs to be requested. Were you able to find out anything from [place] from Omnipod? Same ins as last year, where it was denied. I think it needs to go through the pt's medical benefit/ vs pharmacy.

## 2023-01-14 ENCOUNTER — Emergency Department (HOSPITAL_COMMUNITY)
Admission: EM | Admit: 2023-01-14 | Discharge: 2023-01-14 | Disposition: A | Discharge status: Home / Self Care | Payer: Medicare HMO | Attending: Emergency Medicine | Admitting: Emergency Medicine

## 2023-01-14 ENCOUNTER — Emergency Department (HOSPITAL_COMMUNITY): Payer: Medicare HMO

## 2023-01-14 ENCOUNTER — Other Ambulatory Visit: Payer: Self-pay

## 2023-01-14 ENCOUNTER — Encounter (HOSPITAL_COMMUNITY): Payer: Self-pay

## 2023-01-14 DIAGNOSIS — Z79899 Other long term (current) drug therapy: Secondary | ICD-10-CM | POA: Insufficient documentation

## 2023-01-14 DIAGNOSIS — K610 Anal abscess: Secondary | ICD-10-CM | POA: Diagnosis present

## 2023-01-14 DIAGNOSIS — Z8673 Personal history of transient ischemic attack (TIA), and cerebral infarction without residual deficits: Secondary | ICD-10-CM | POA: Diagnosis not present

## 2023-01-14 DIAGNOSIS — E1022 Type 1 diabetes mellitus with diabetic chronic kidney disease: Secondary | ICD-10-CM | POA: Insufficient documentation

## 2023-01-14 DIAGNOSIS — Z794 Long term (current) use of insulin: Secondary | ICD-10-CM | POA: Insufficient documentation

## 2023-01-14 DIAGNOSIS — D72829 Elevated white blood cell count, unspecified: Secondary | ICD-10-CM | POA: Diagnosis not present

## 2023-01-14 DIAGNOSIS — N183 Chronic kidney disease, stage 3 unspecified: Secondary | ICD-10-CM | POA: Diagnosis not present

## 2023-01-14 DIAGNOSIS — I129 Hypertensive chronic kidney disease with stage 1 through stage 4 chronic kidney disease, or unspecified chronic kidney disease: Secondary | ICD-10-CM | POA: Insufficient documentation

## 2023-01-14 DIAGNOSIS — Z7982 Long term (current) use of aspirin: Secondary | ICD-10-CM | POA: Diagnosis not present

## 2023-01-14 DIAGNOSIS — F17210 Nicotine dependence, cigarettes, uncomplicated: Secondary | ICD-10-CM | POA: Diagnosis not present

## 2023-01-14 DIAGNOSIS — Z7902 Long term (current) use of antithrombotics/antiplatelets: Secondary | ICD-10-CM | POA: Diagnosis not present

## 2023-01-14 LAB — COMPREHENSIVE METABOLIC PANEL
ALT: 19 U/L (ref 0–44)
AST: 17 U/L (ref 15–41)
Albumin: 3.8 g/dL (ref 3.5–5.0)
Alkaline Phosphatase: 101 U/L (ref 38–126)
Anion gap: 8 (ref 5–15)
BUN: 33 mg/dL — ABNORMAL HIGH (ref 6–20)
CO2: 21 mmol/L — ABNORMAL LOW (ref 22–32)
Calcium: 9.4 mg/dL (ref 8.9–10.3)
Chloride: 105 mmol/L (ref 98–111)
Creatinine, Ser: 3.13 mg/dL — ABNORMAL HIGH (ref 0.61–1.24)
GFR, Estimated: 25 mL/min — ABNORMAL LOW (ref >60)
Glucose, Bld: 151 mg/dL — ABNORMAL HIGH (ref 70–99)
Potassium: 5.3 mmol/L — ABNORMAL HIGH (ref 3.5–5.1)
Sodium: 134 mmol/L — ABNORMAL LOW (ref 135–145)
Total Bilirubin: 0.7 mg/dL (ref 0.3–1.2)
Total Protein: 8 g/dL (ref 6.5–8.1)

## 2023-01-14 LAB — CBC WITH DIFFERENTIAL/PLATELET
Abs Immature Granulocytes: 0.04 10*3/uL (ref 0.00–0.07)
Basophils Absolute: 0.1 10*3/uL (ref 0.0–0.1)
Basophils Relative: 1 %
Eosinophils Absolute: 0.5 10*3/uL (ref 0.0–0.5)
Eosinophils Relative: 4 %
HCT: 30.6 % — ABNORMAL LOW (ref 39.0–52.0)
Hemoglobin: 9.8 g/dL — ABNORMAL LOW (ref 13.0–17.0)
Immature Granulocytes: 0 %
Lymphocytes Relative: 17 %
Lymphs Abs: 2.2 10*3/uL (ref 0.7–4.0)
MCH: 28.5 pg (ref 26.0–34.0)
MCHC: 32 g/dL (ref 30.0–36.0)
MCV: 89 fL (ref 80.0–100.0)
Monocytes Absolute: 0.9 10*3/uL (ref 0.1–1.0)
Monocytes Relative: 7 %
Neutro Abs: 9.7 10*3/uL — ABNORMAL HIGH (ref 1.7–7.7)
Neutrophils Relative %: 71 %
Platelets: 257 10*3/uL (ref 150–400)
RBC: 3.44 MIL/uL — ABNORMAL LOW (ref 4.22–5.81)
RDW: 17.4 % — ABNORMAL HIGH (ref 11.5–15.5)
WBC: 13.5 10*3/uL — ABNORMAL HIGH (ref 4.0–10.5)
nRBC: 0 % (ref 0.0–0.2)

## 2023-01-14 MED ORDER — LIDOCAINE HCL (PF) 1 % IJ SOLN
10.0000 mL | Freq: Once | INTRAMUSCULAR | Status: AC
  Administered 2023-01-14: 10 mL

## 2023-01-14 MED ORDER — DOXYCYCLINE HYCLATE 100 MG PO CAPS: 100.0000 mg | ORAL_CAPSULE | Freq: Two times a day (BID) | ORAL | 0 refills | Status: DC

## 2023-01-14 MED ORDER — LIDOCAINE HCL (PF) 1 % IJ SOLN
10.0000 mL | Freq: Once | INTRAMUSCULAR | Status: DC
  Filled 2023-01-14: qty 10

## 2023-01-14 MED ORDER — FENTANYL CITRATE PF 50 MCG/ML IJ SOSY
50.0000 ug | PREFILLED_SYRINGE | Freq: Once | INTRAMUSCULAR | Status: AC
  Administered 2023-01-14: 50 ug via INTRAVENOUS
  Filled 2023-01-14: qty 1

## 2023-01-14 MED ORDER — DOXYCYCLINE HYCLATE 100 MG PO TABS
100.0000 mg | ORAL_TABLET | Freq: Once | ORAL | Status: AC
  Administered 2023-01-14: 100 mg via ORAL
  Filled 2023-01-14: qty 1

## 2023-01-14 MED ORDER — SODIUM CHLORIDE 0.9 % IV BOLUS
1000.0000 mL | Freq: Once | INTRAVENOUS | Status: AC
  Administered 2023-01-14: 1000 mL via INTRAVENOUS

## 2023-01-14 NOTE — Discharge Instructions (Addendum)
We evaluated you for your abscess.  We drained your abscess in the emergency department and have started you on antibiotics.  Please follow-up with the general surgeon Dr. Lovell Sheehan.  You can also call the wound care center for follow-up.  Please return if you develop any worsening swelling, fevers or chills, increasing pain, lightheadedness or confusion, painful defecation, or any other concerning symptoms.

## 2023-01-14 NOTE — ED Notes (Signed)
Patient transported to CT 

## 2023-01-14 NOTE — ED Provider Notes (Signed)
[place] EMERGENCY DEPARTMENT AT [facility] Provider Note  CSN: [id number] Arrival date & time: 01/14/23 1721  Chief Complaint(s) Abscess  HPI Nathaniel [name] is a 37 y.o. male with complicated medical history including prior stroke, multiple episodes of DKA, episode of necrotizing fasciitis, prior cardiac arrest presenting to the emergency department with swelling.  Patient reports that over the past few days he has had some swelling around his right buttock.  He reports he is still going to the bathroom normally.  No painful defecation.  No fevers or chills.  No nausea or vomiting.  Has been compliant with his home medications.  No difficulty breathing, chest pain or shortness of breath.  No headaches.  Reports other than this he has been at his baseline.  Wound care 3 times weekly for his chronic wounds from his episode of necrotizing fasciitis.   Past Medical History Past Medical History:  Diagnosis Date   Diabetes mellitus    lantus/novolog   Hyperlipidemia    Hypertension    Marijuana abuse    occaisionally   Noncompliance with medication regimen    Stroke (HCC) 2020   Tobacco abuse    5/day   Patient Active Problem List   Diagnosis Date Noted   Cardiac arrest, cause unspecified (HCC) 10/18/2022   Septic shock (HCC) 10/18/2022   Acute respiratory failure (HCC) 10/18/2022   Primary open angle glaucoma of left eye, mild stage 12/21/2021   SIRS (systemic inflammatory response syndrome) (HCC) 07/15/2020   Acute metabolic encephalopathy due to DKA/AKI/Dehydration 07/15/2020   DKA (diabetic ketoacidosis) (HCC) 07/14/2020   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 determined by examination associated with type 1 diabetes mellitus (HCC) 01/28/2020   Stable treated proliferative diabetic retinopathy of left eye without macular edema determined by examination associated with type 1 diabetes mellitus (HCC) 01/28/2020   Pseudophakia 01/28/2020    CKD stage 3 secondary to diabetes (HCC)    Hypoglycemia    Neurosyphilis    Mixed hyperlipidemia    Acute ischemic left MCA stroke (HCC) 02/22/2018   Abnormality of gait following cerebrovascular accident (CVA)    Type 1 diabetes mellitus with peripheral circulatory complications (HCC)    Essential hypertension    Dyslipidemia    Syphilis    DKA (diabetic ketoacidoses) 02/16/2018   CVA (cerebral vascular accident) (HCC) 02/16/2018   Acute renal failure (ARF) (HCC) 10/21/2016   Diabetic ketoacidosis without coma associated with type 1 diabetes mellitus (HCC)    Hyperbilirubinemia    Hyponatremia 04/15/2016   DM (diabetes mellitus) type 1, uncontrolled, with ketoacidosis (HCC) 04/15/2016   Hyperglycemia 04/15/2016   AKI (acute kidney injury) (HCC) 01/04/2016   Malignant hypertension 01/04/2016   Noncompliance with medications 01/04/2016   Intractable nausea and vomiting 04/01/2015   Gastroparesis 04/01/2015   Type 1 diabetes mellitus with complication (HCC) 04/01/2015   Chronic hypertension    Nausea with vomiting    Abscess, gluteal, right 06/21/2014   Nausea and vomiting 04/08/2013   Hyperglycemia without ketosis 04/08/2013   Essential hypertension, benign 04/08/2013   Hypercalcemia 07/29/2011   Vomiting 07/28/2011   Leukocytosis 07/28/2011   Hypokalemia 07/28/2011   Dehydration 07/27/2011   Smoker 07/27/2011   Marijuana abuse 07/27/2011   Home Medication(s) Prior to Admission medications   Medication Sig Start Date End Date Taking? Authorizing Provider  doxycycline (VIBRAMYCIN) 100 MG capsule Take 1 capsule (100 mg total) by mouth 2 (two) times daily. 01/14/23  Yes Lonell Grandchild, MD  acetaminophen (  TYLENOL) 325 MG tablet Take 1-2 tablets (325-650 mg total) by mouth every 4 (four) hours as needed for mild pain. 03/07/18   Love, Evlyn Kanner, PA-C  amLODipine (NORVASC) 10 MG tablet Take 1 tablet (10 mg total) by mouth daily. For blood pressure 07/16/20   Emokpae, Courage,  MD  ASPIRIN LOW DOSE 81 MG EC tablet Take 81 mg by mouth every morning. 10/21/21   [provider]  atorvastatin (LIPITOR) 80 MG tablet Take 1 tablet (80 mg total) by mouth every evening. For stroke prevention 07/16/20   Shon Hale, MD  clopidogrel (PLAVIX) 75 MG tablet Take 1 tablet (75 mg total) by mouth daily. For stroke prevention 07/16/20   Shon Hale, MD  Continuous Blood Gluc Sensor (DEXCOM G6 SENSOR) MISC 1 each by Does not apply route every 14 (fourteen) days. 07/21/21   Reather Littler, MD  Continuous Blood Gluc Transmit (DEXCOM G6 TRANSMITTER) MISC Check sugar 4 times a day 07/21/21   Reather Littler, MD  ferrous sulfate 325 (65 FE) MG tablet Take 325 mg by mouth daily with breakfast.    [provider]  glucose blood (ONETOUCH ULTRA) test strip USE TO TEST BLOOD SUGAR 4 TIMES A DAY 12/14/22   Reather Littler, MD  insulin aspart (NOVOLOG) 100 UNIT/ML FlexPen Inject per sliding scale before meals: CBG 70 - 120: 0 units CBG 121 - 150: 1 unit CBG 151 - 200: 2 units CBG 201 - 250: 3 units CBG 251 - 300: 5 units CBG 301 - 350: 7 units CBG 351 - 400: 9 units CBG >400: 9 units and call doctor 10/25/22   Tyrone Nine, MD  insulin degludec (TRESIBA FLEXTOUCH) 100 UNIT/ML FlexTouch Pen Inject 28 Units into the skin daily. 12/01/22   Reather Littler, MD  Insulin Disposable Pump (OMNIPOD 5 G6 PODS, GEN 5,) MISC 1 Device by Does not apply route every 3 (three) days. 01/05/23   Reather Littler, MD  Insulin Pen Needle (PEN NEEDLES 3/16") 31G X 5 MM MISC Four times day 07/16/20   Shon Hale, MD  labetalol (NORMODYNE) 100 MG tablet Take 1 tablet (100 mg total) by mouth 2 (two) times daily. For BP 07/16/20   Emokpae, Courage, MD  Multiple Vitamin (MULTIVITAMIN) tablet Take 1 tablet by mouth daily.    [provider]  Nutritional Supplements (GLUCERNA 1.5 CAL PO) Take 237 mLs by mouth daily.    [provider]  polyethylene glycol (MIRALAX / GLYCOLAX) 17 g packet Take 17 g by mouth  daily.    [provider]  sodium zirconium cyclosilicate (LOKELMA) 10 g PACK packet Take 10 g by mouth daily. 10/25/22   Tyrone Nine, MD  Vitamin D, Ergocalciferol, (DRISDOL) 1.25 MG (50000 UNIT) CAPS capsule Take 1 capsule (50,000 Units total) by mouth once a week. Patient taking differently: Take 50,000 Units by mouth every Friday. 07/16/20   Shon Hale, MD  Past Surgical History Past Surgical History:  Procedure Laterality Date   AV FISTULA PLACEMENT Left 01/08/2021   Procedure: LEFT ARM ARTERIOVENOUS (AV) FISTULA CREATION;  Surgeon: Larina Earthly, MD;  Location: [facility];  Service: Vascular;  Laterality: Left;   EYE SURGERY     Family History Family History  Problem Relation Age of Onset   Diabetes Father    Kidney failure Father        last 4 mnths of life   Hypertension Mother    Diabetes Maternal Grandmother    Pancreatic cancer Maternal Grandfather    Diabetes Paternal Grandfather     Social History Social History   Tobacco Use   Smoking status: Every Day    Packs/day: 0.50    Years: 8.00    Additional pack years: 0.00    Total pack years: 4.00    Types: Cigarettes   Smokeless tobacco: Never   Tobacco comments:    smoke 5-8 per day  Vaping Use   Vaping Use: Never used  Substance Use Topics   Alcohol use: Never   Drug use: Yes    Types: Marijuana    Comment: last use yesterday, every other days    Allergies Fructose  Review of Systems Review of Systems  All other systems reviewed and are negative.   Physical Exam Vital Signs  I have reviewed the triage vital signs BP (!) 169/98   Pulse (!) 101   Temp 99.3 F (37.4 C) (Oral)   Resp 18   Ht 5\' 11"  (1.803 m)   Wt 66.7 kg   SpO2 100%   BMI 20.50 kg/m  Physical Exam Vitals and nursing note reviewed.  Constitutional:      General: He is not in acute  distress.    Appearance: Normal appearance.  HENT:     Mouth/Throat:     Mouth: Mucous membranes are moist.  Eyes:     Conjunctiva/sclera: Conjunctivae normal.  Cardiovascular:     Rate and Rhythm: Normal rate and regular rhythm.  Pulmonary:     Effort: Pulmonary effort is normal. No respiratory distress.     Breath sounds: Normal breath sounds.  Abdominal:     General: Abdomen is flat.     Palpations: Abdomen is soft.     Tenderness: There is no abdominal tenderness.  Musculoskeletal:     Right lower leg: No edema.     Left lower leg: No edema.     Comments: Chronic wound to the right lateral thigh.  On the right perianal region/buttock there is approximately 3 x 3 cm fluctuant and partially indurated area of swelling . No focal erythema.  No spontaneous drainage.  Skin:    General: Skin is warm and dry.     Capillary Refill: Capillary refill takes less than 2 seconds.  Neurological:     Mental Status: He is alert and oriented to person, place, and time. Mental status is at baseline.  Psychiatric:        Mood and Affect: Mood normal.        Behavior: Behavior normal.     ED Results and Treatments Labs (all labs ordered are listed, but only abnormal results are displayed) Labs Reviewed  COMPREHENSIVE METABOLIC PANEL - Abnormal; Notable for the following components:      Result Value   Sodium 134 (*)    Potassium 5.3 (*)    CO2 21 (*)    Glucose, Bld 151 (*)    BUN 33 (*)  Creatinine, Ser 3.13 (*)    GFR, Estimated 25 (*)    All other components within normal limits  CBC WITH DIFFERENTIAL/PLATELET - Abnormal; Notable for the following components:   WBC 13.5 (*)    RBC 3.44 (*)    Hemoglobin 9.8 (*)    HCT 30.6 (*)    RDW 17.4 (*)    Neutro Abs 9.7 (*)    All other components within normal limits                                                                                                                          Radiology CT ABDOMEN PELVIS WO  CONTRAST  Result Date: 01/14/2023 CLINICAL DATA:  Perianal cancer, staging perirectal abscess. Painful mass near rectum. EXAM: CT ABDOMEN AND PELVIS WITHOUT CONTRAST TECHNIQUE: Multidetector CT imaging of the abdomen and pelvis was performed following the standard protocol without IV contrast. RADIATION DOSE REDUCTION: This exam was performed according to the departmental dose-optimization program which includes automated exposure control, adjustment of the mA and/or kV according to patient size and/or use of iterative reconstruction technique. COMPARISON:  11/22/2015, [id number]. FINDINGS: Lower chest: No acute abnormality. Hepatobiliary: No focal liver abnormality is seen. No gallstones, gallbladder wall thickening, or biliary dilatation. Pancreas: Unremarkable. No pancreatic ductal dilatation or surrounding inflammatory changes. Spleen: Normal in size without focal abnormality. Adrenals/Urinary Tract: The adrenal glands are within normal limits. No renal calculus or hydronephrosis. Vascular calcifications are present at the renal hila bilaterally. The bladder is within normal limits. Stomach/Bowel: Stomach is within normal limits. Appendix appears normal. No evidence of bowel wall thickening, distention, or inflammatory changes. No free air or pneumatosis. A moderate amount of retained stool is present in the colon. Vascular/Lymphatic: Aortic atherosclerosis. No enlarged abdominal or pelvic lymph nodes. Reproductive: Prostate is unremarkable. Other: No abdominopelvic ascites. Hypodense collection is noted in the gluteal fold to the right of midline measuring 4.4 x 1.6 cm and extending from the perianal space on the right, not well evaluated due to lack of IV contrast. Increased subcutaneous skin density and skin defects are seen over the gluteal region on the right and extending into the posterior right thigh. Musculoskeletal: No acute or suspicious osseous abnormality. IMPRESSION: 1. Hypodense collection  extending from the perianal space on the right into the medial gluteal fold measuring 4.4 x 1.6 cm, possible abscess. 2. Increased density in the subcutaneous tissues with skin defects over the gluteal region on the right, which may be related to history of abscess with osteomyelitis. 3. Aortic atherosclerosis. Electronically Signed   By: Thornell Sartorius M.D.   On: 01/14/2023 20:26    Pertinent labs & imaging results that were available during my care of the patient were reviewed by me and considered in my medical decision making (see MDM for details).  Medications Ordered in ED Medications  doxycycline (VIBRA-TABS) tablet 100 mg (has no administration in time range)  sodium chloride 0.9 % bolus 1,000 mL (1,000 mLs Intravenous New  Bag/Given 01/14/23 1929)  lidocaine (PF) (XYLOCAINE) 1 % injection 10 mL (10 mLs Infiltration Given 01/14/23 2257)  fentaNYL (SUBLIMAZE) injection 50 mcg (50 mcg Intravenous Given 01/14/23 2256)                                                                                                                                     Procedures Ultrasound ED Soft Tissue  Date/Time: 01/14/2023 11:27 PM  Performed by: Lonell Grandchild, MD Authorized by: Lonell Grandchild, MD   Procedure details:    Indications: localization of abscess     Transverse view:  Visualized   Longitudinal view:  Visualized   Images: archived   Location:    Location: buttocks     Side:  Right Findings:     abscess present .Marland KitchenIncision and Drainage  Date/Time: 01/14/2023 11:27 PM  Performed by: Lonell Grandchild, MD Authorized by: Lonell Grandchild, MD   Consent:    Consent obtained:  Verbal   Consent given by:  Patient, guardian and healthcare agent   Risks, benefits, and alternatives were discussed: yes     Risks discussed:  Bleeding, damage to other organs, pain, incomplete drainage and infection   Alternatives discussed:  No treatment and alternative treatment Universal protocol:     Procedure explained and questions answered to patient or proxy's satisfaction: yes     Patient identity confirmed:  Verbally with patient and arm band Location:    Type:  Abscess   Location:  Anogenital   Anogenital location:  Perianal Pre-procedure details:    Skin preparation:  Povidone-iodine Sedation:    Sedation type:  None Anesthesia:    Anesthesia method:  Local infiltration   Local anesthetic:  Lidocaine 1% w/o epi Procedure type:    Complexity:  Complex Procedure details:    Ultrasound guidance: yes     Needle aspiration: no     Incision types:  Single straight   Incision depth:  Dermal   Wound management:  Probed and deloculated   Drainage:  Purulent and bloody   Drainage amount:  Copious   Packing materials:  1/2 in iodoform gauze Post-procedure details:    Procedure completion:  Tolerated well, no immediate complications   (including critical care time)  Medical Decision Making / ED Course   MDM:  37 year old male presenting with abscess.  Patient has complicated history.  Although appeared to be perianal or gluteal abscess given medical history obtain CT imaging to further evaluate.  CT scan shows perianal collection which does not appear to involve rectum.  Incision and drainage was performed with improvement in pain and copious pus drainage.  Wound was packed given location.  Patient has home health nurse for wound care who already comes 3 times a week.  Advised that packing can be removed on Monday by this nurse, if it falls out before then that is okay.  Will prescribe doxycycline.  No  significant surrounding erythema or warmth to suggest superimposed cellulitis.  Advise follow-up with general surgery.  Patient mother also request wound care physician so provided contact information for wound care center at [facility].      Additional history obtained: -Additional history obtained from family -External records from outside source obtained and reviewed  including: Chart review including previous notes, labs, imaging, consultation notes including prior admission for DKA/cardiac arrest   Lab Tests: -I ordered, reviewed, and interpreted labs.   The pertinent results include:   Labs Reviewed  COMPREHENSIVE METABOLIC PANEL - Abnormal; Notable for the following components:      Result Value   Sodium 134 (*)    Potassium 5.3 (*)    CO2 21 (*)    Glucose, Bld 151 (*)    BUN 33 (*)    Creatinine, Ser 3.13 (*)    GFR, Estimated 25 (*)    All other components within normal limits  CBC WITH DIFFERENTIAL/PLATELET - Abnormal; Notable for the following components:   WBC 13.5 (*)    RBC 3.44 (*)    Hemoglobin 9.8 (*)    HCT 30.6 (*)    RDW 17.4 (*)    Neutro Abs 9.7 (*)    All other components within normal limits    Notable for leukocytosis, chronic kidney disease   Imaging Studies ordered: I ordered imaging studies including CT abd/pelvis On my interpretation imaging demonstrates perianal/gluteal abscess I independently visualized and interpreted imaging. I agree with the radiologist interpretation   Medicines ordered and prescription drug management: Meds ordered this encounter  Medications   sodium chloride 0.9 % bolus 1,000 mL   DISCONTD: lidocaine (PF) (XYLOCAINE) 1 % injection 10 mL   lidocaine (PF) (XYLOCAINE) 1 % injection 10 mL   fentaNYL (SUBLIMAZE) injection 50 mcg   doxycycline (VIBRAMYCIN) 100 MG capsule    Sig: Take 1 capsule (100 mg total) by mouth 2 (two) times daily.    Dispense:  20 capsule    Refill:  0   doxycycline (VIBRA-TABS) tablet 100 mg    -I have reviewed the patients home medicines and have made adjustments as needed  Social Determinants of Health:  Diagnosis or treatment significantly limited by social determinants of health: marijunana use   Reevaluation: After the interventions noted above, I reevaluated the patient and found that their symptoms have improved  Co morbidities that complicate  the patient evaluation  Past Medical History:  Diagnosis Date   Diabetes mellitus    lantus/novolog   Hyperlipidemia    Hypertension    Marijuana abuse    occaisionally   Noncompliance with medication regimen    Stroke (HCC) 2020   Tobacco abuse    5/day      Dispostion: Disposition decision including need for hospitalization was considered, and patient discharged from emergency department.    Final Clinical Impression(s) / ED Diagnoses Final diagnoses:  Perianal abscess     This chart was dictated using voice recognition software.  Despite best efforts to proofread,  errors can occur which can change the documentation meaning.    Lonell Grandchild, MD 01/14/23 2328

## 2023-01-14 NOTE — ED Triage Notes (Signed)
Pt c/o painful mass near his rectum. Pt has previously had an abscess to the area that had to be drained.

## 2023-01-14 NOTE — ED Notes (Signed)
Resting well.

## 2023-01-17 ENCOUNTER — Telehealth: Payer: Self-pay

## 2023-01-17 NOTE — Transitions of Care (Post Inpatient/ED Visit) (Signed)
   01/17/2023  Name: Nathaniel [name] MRN: [id number] DOB: Feb 14, 1986  The patient was given information about care management services as a benefit of their Medicaid health plan today.   Parent                                                                         did not agree to enrollment in care management services and does not wish to consider at this time.    [name], MHA [facility]  Managed [facility] Social Worker [phone]

## 2023-01-17 NOTE — Transitions of Care (Post Inpatient/ED Visit) (Signed)
   01/17/2023  Name: Nathaniel [name] MRN: [id number] DOB: November 01, 1985  Today's TOC FU Call Status: Today's TOC FU Call Status:: Successful TOC FU Call Competed TOC FU Call Complete Date: 01/17/23  Transition Care Management Follow-up Telephone Call    Items Reviewed: Did you receive and understand the discharge instructions provided?: Yes Medications obtained and verified?: Yes (Medications Reviewed) Any new allergies since your discharge?: No Dietary orders reviewed?: No Do you have support at home?: Yes People in Home: parent(s)  Home Care and Equipment/Supplies: Were Home Health Services Ordered?: No Any new equipment or medical supplies ordered?: No  Functional Questionnaire: Do you need assistance with bathing/showering or dressing?: No Do you need assistance with meal preparation?: No Do you need assistance with eating?: No Do you have difficulty maintaining continence: No Do you need assistance with getting out of bed/getting out of a chair/moving?: No Do you have difficulty managing or taking your medications?: No  Follow up appointments reviewed: PCP Follow-up appointment confirmed?: No Specialist Hospital Follow-up appointment confirmed?: No Do you need transportation to your follow-up appointment?: No Do you understand care options if your condition(s) worsen?: Yes-patient verbalized understanding    Gus Puma, Kenard Gower, [facility]  Managed [facility] Social Worker [phone]

## 2023-01-20 ENCOUNTER — Other Ambulatory Visit (HOSPITAL_COMMUNITY): Payer: Self-pay

## 2023-01-20 NOTE — Telephone Encounter (Signed)
I haven't received a response from [facility] about this, but wanted to let you know that [facility] doesn't bill medical ins, only pharmacy. We got a fax from them for a PA. This pt's Omnipods need to go to a DME supplier.

## 2023-01-25 ENCOUNTER — Other Ambulatory Visit: Payer: Self-pay

## 2023-01-25 DIAGNOSIS — E1065 Type 1 diabetes mellitus with hyperglycemia: Secondary | ICD-10-CM

## 2023-01-25 MED ORDER — INSULIN ASPART 100 UNIT/ML FLEXPEN
PEN_INJECTOR | SUBCUTANEOUS | 1 refills | Status: DC
Start: 2023-01-25 — End: 2023-03-18

## 2023-01-25 MED ORDER — "PEN NEEDLES 3/16"" 31G X 5 MM MISC": 11 refills | Status: DC

## 2023-01-25 NOTE — Telephone Encounter (Signed)
Order put in through [facility]

## 2023-01-27 ENCOUNTER — Ambulatory Visit (INDEPENDENT_AMBULATORY_CARE_PROVIDER_SITE_OTHER): Payer: Medicare HMO | Admitting: General Surgery

## 2023-01-27 ENCOUNTER — Encounter: Payer: Self-pay | Admitting: General Surgery

## 2023-01-27 VITALS — BP 115/74 | HR 93 | Temp 98.4°F | Resp 12 | Ht 71.0 in | Wt 149.0 lb

## 2023-01-27 DIAGNOSIS — L0231 Cutaneous abscess of buttock: Secondary | ICD-10-CM

## 2023-01-27 MED ORDER — DOXYCYCLINE HYCLATE 100 MG PO CAPS: 100.0000 mg | ORAL_CAPSULE | Freq: Two times a day (BID) | ORAL | 0 refills | Status: DC

## 2023-01-28 NOTE — Progress Notes (Signed)
Nathaniel [name]; [id number]; [birth year]   HPI Patient is a 37 year old black male who was referred to my care by the emergency room and Dr. Felecia Shelling for evaluation and treatment of a right buttock abscess.  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  He was recently seen in the emergency room and underwent incision and drainage of a 3 x 3 cm right buttock abscess.  Patient states the drainage stopped recently.  He is following up with his wound care physician next week.  He has been on doxycycline.  He denies any fevers at the present time. Past Medical History:  Diagnosis Date   Diabetes mellitus    lantus/novolog   Hyperlipidemia    Hypertension    Marijuana abuse    occaisionally   Noncompliance with medication regimen    Stroke (HCC) 2020   Tobacco abuse    5/day    Past Surgical History:  Procedure Laterality Date   AV FISTULA PLACEMENT Left 01/08/2021   Procedure: LEFT ARM ARTERIOVENOUS (AV) FISTULA CREATION;  Surgeon: Larina Earthly, MD;  Location: [facility];  Service: Vascular;  Laterality: Left;   EYE SURGERY      Family History  Problem Relation Age of Onset   Diabetes Father    Kidney failure Father        last 4 mnths of life   Hypertension Mother    Diabetes Maternal Grandmother    Pancreatic cancer Maternal Grandfather    Diabetes Paternal Grandfather     Current Outpatient Medications on File Prior to Visit  Medication Sig Dispense Refill   acetaminophen (TYLENOL) 325 MG tablet Take 1-2 tablets (325-650 mg total) by mouth every 4 (four) hours as needed for mild pain.     amLODipine (NORVASC) 10 MG tablet Take 1 tablet (10 mg total) by mouth daily. For blood pressure 90 tablet 11   ASPIRIN LOW DOSE 81 MG EC tablet Take 81 mg by mouth every morning.     atorvastatin (LIPITOR) 80 MG tablet Take 1 tablet (80 mg total) by mouth every evening. For stroke prevention 90 tablet 11   clopidogrel  (PLAVIX) 75 MG tablet Take 1 tablet (75 mg total) by mouth daily. For stroke prevention 90 tablet 3   Continuous Blood Gluc Sensor (DEXCOM G6 SENSOR) MISC 1 each by Does not apply route every 14 (fourteen) days. 9 each 3   Continuous Blood Gluc Transmit (DEXCOM G6 TRANSMITTER) MISC Check sugar 4 times a day 1 each 3   ferrous sulfate 325 (65 FE) MG tablet Take 325 mg by mouth daily with breakfast.     glucose blood (ONETOUCH ULTRA) test strip USE TO TEST BLOOD SUGAR 4 TIMES A DAY 100 strip 6   insulin aspart (NOVOLOG) 100 UNIT/ML FlexPen Inject per sliding scale before meals: CBG 70 - 120: 0 units CBG 121 - 150: 1 unit CBG 151 - 200: 2 units CBG 201 - 250: 3 units CBG 251 - 300: 5 units CBG 301 - 350: 7 units CBG 351 - 400: 9 units CBG >400: 9 units and call doctor 15 mL 1   insulin degludec (TRESIBA FLEXTOUCH) 100 UNIT/ML FlexTouch Pen Inject 28 Units into the skin daily. 15 mL 1   Insulin Disposable Pump (OMNIPOD 5 G6 PODS, GEN 5,) MISC 1 Device by Does not apply route every 3 (three) days. 2 each 3   Insulin Pen Needle (PEN NEEDLES 3/16") 31G X 5 MM  MISC Four times day 200 each 11   labetalol (NORMODYNE) 100 MG tablet Take 1 tablet (100 mg total) by mouth 2 (two) times daily. For BP 180 tablet 4   Multiple Vitamin (MULTIVITAMIN) tablet Take 1 tablet by mouth daily.     Nutritional Supplements (GLUCERNA 1.5 CAL PO) Take 237 mLs by mouth daily.     polyethylene glycol (MIRALAX / GLYCOLAX) 17 g packet Take 17 g by mouth daily.     sodium zirconium cyclosilicate (LOKELMA) 10 g PACK packet Take 10 g by mouth daily. 30 packet 0   Vitamin D, Ergocalciferol, (DRISDOL) 1.25 MG (50000 UNIT) CAPS capsule Take 1 capsule (50,000 Units total) by mouth once a week. (Patient taking differently: Take 50,000 Units by mouth every Friday.) 5 capsule 2   No current facility-administered medications on file prior to visit.    Allergies  Allergen Reactions   Fructose Other (See Comments)    Increase of blood  sugar     Social History   Substance and Sexual Activity  Alcohol Use Never    Social History   Tobacco Use  Smoking Status Every Day   Packs/day: 0.50   Years: 8.00   Additional pack years: 0.00   Total pack years: 4.00   Types: Cigarettes  Smokeless Tobacco Never  Tobacco Comments   smoke 5-8 per day    Review of Systems  Constitutional: Negative.   HENT: Negative.    Eyes: Negative.   Respiratory: Negative.    Cardiovascular: Negative.   Gastrointestinal: Negative.   Genitourinary: Negative.   Musculoskeletal: Negative.   Skin: Negative.   Neurological: Negative.   Endo/Heme/Allergies: Negative.   Psychiatric/Behavioral: Negative.      Objective   Vitals:   01/27/23 1132  BP: 115/74  Pulse: 93  Resp: 12  Temp: 98.4 F (36.9 C)  SpO2: 100%    Physical Exam Vitals reviewed.  Constitutional:      Appearance: Normal appearance. He is normal weight. He is not ill-appearing.  HENT:     Head: Normocephalic and atraumatic.  Cardiovascular:     Rate and Rhythm: Normal rate and regular rhythm.     Heart sounds: No murmur heard.    No friction rub. No gallop.  Pulmonary:     Effort: Pulmonary effort is normal. No respiratory distress.     Breath sounds: Normal breath sounds. No stridor. No wheezing, rhonchi or rales.  Skin:    General: Skin is warm and dry.     Comments: A 1-1/2 cm fluctuant abscess with a healed incision is noted just inferior to the coccyx at the right buttock cleft.  I did aspirate 1.5 cc of purulent fluid.  Multiple surgical scars and healed wounds are noted in the perineum and buttock regions.  Neurological:     Mental Status: He is alert and oriented to person, place, and time.     Gait: Gait abnormal.     Assessment  Right buttock abscess resolving Plan  As the patient only had 1 day left of doxycycline, I did reorder it for 1 week.  Patient is to follow-up with the wound care center as previously scheduled.  Follow-up here  as needed.

## 2023-02-01 ENCOUNTER — Other Ambulatory Visit (HOSPITAL_COMMUNITY): Payer: Self-pay

## 2023-02-01 ENCOUNTER — Telehealth: Payer: Self-pay | Admitting: Pharmacy Technician

## 2023-02-01 NOTE — Telephone Encounter (Signed)
Pharmacy Patient Advocate Encounter   Received another fax from [facility]. that prior authorization for Omnipod is required/requested.   PA submitted on 02/01/23 to (ins) [employer] via [employer] or (Medicaid) confirmation # [id number] - PA Case ID: [id number] Status is pending - I'm pretty sure this needs to go through the pt's medical ins instead of Pharmacy, but I submitted to PA to see.

## 2023-02-01 NOTE — Telephone Encounter (Signed)
Pharmacy Patient Advocate Encounter  Received notification from [facility] Medicare that the request for prior authorization for Omnipod has been denied.     Please advise.

## 2023-02-08 ENCOUNTER — Encounter (HOSPITAL_BASED_OUTPATIENT_CLINIC_OR_DEPARTMENT_OTHER): Payer: Medicare HMO | Attending: Internal Medicine | Admitting: Internal Medicine

## 2023-02-08 DIAGNOSIS — Z833 Family history of diabetes mellitus: Secondary | ICD-10-CM | POA: Insufficient documentation

## 2023-02-08 DIAGNOSIS — Z8249 Family history of ischemic heart disease and other diseases of the circulatory system: Secondary | ICD-10-CM | POA: Diagnosis not present

## 2023-02-08 DIAGNOSIS — L98492 Non-pressure chronic ulcer of skin of other sites with fat layer exposed: Secondary | ICD-10-CM | POA: Insufficient documentation

## 2023-02-08 DIAGNOSIS — E10622 Type 1 diabetes mellitus with other skin ulcer: Secondary | ICD-10-CM | POA: Insufficient documentation

## 2023-02-08 DIAGNOSIS — S31819A Unspecified open wound of right buttock, initial encounter: Secondary | ICD-10-CM | POA: Diagnosis not present

## 2023-02-08 DIAGNOSIS — X58XXXA Exposure to other specified factors, initial encounter: Secondary | ICD-10-CM | POA: Insufficient documentation

## 2023-02-08 DIAGNOSIS — L0291 Cutaneous abscess, unspecified: Secondary | ICD-10-CM | POA: Insufficient documentation

## 2023-02-08 DIAGNOSIS — E1022 Type 1 diabetes mellitus with diabetic chronic kidney disease: Secondary | ICD-10-CM | POA: Insufficient documentation

## 2023-02-08 DIAGNOSIS — F17210 Nicotine dependence, cigarettes, uncomplicated: Secondary | ICD-10-CM | POA: Diagnosis not present

## 2023-02-08 DIAGNOSIS — Z8674 Personal history of sudden cardiac arrest: Secondary | ICD-10-CM | POA: Insufficient documentation

## 2023-02-08 DIAGNOSIS — N183 Chronic kidney disease, stage 3 unspecified: Secondary | ICD-10-CM | POA: Diagnosis not present

## 2023-02-08 DIAGNOSIS — I129 Hypertensive chronic kidney disease with stage 1 through stage 4 chronic kidney disease, or unspecified chronic kidney disease: Secondary | ICD-10-CM | POA: Diagnosis not present

## 2023-02-08 DIAGNOSIS — Z794 Long term (current) use of insulin: Secondary | ICD-10-CM | POA: Diagnosis not present

## 2023-02-08 NOTE — Progress Notes (Signed)
MAJD, BLUMER ([id number]) [phone] [id number] Page 1 of 4 Visit Report for 02/08/2023 Abuse Risk Screen Details Patient Name: Date of Service: Nathaniel [name], [place] NA Nathaniel K. 02/08/2023 12:30 PM Medical Record Number: [id number] Patient Account Number: [id number] Date of Birth/Sex: Treating RN: 1986-07-31 (37 y.o. Nathaniel [name] Primary Care Nathaniel [name]: Nathaniel [name] Other Clinician: Referring Nathaniel [name]: Treating Nathaniel [name]/Extender: Nathaniel [name] Weeks in Treatment: 0 Abuse Risk Screen Items Answer ABUSE RISK SCREEN: Has anyone close to you tried to hurt or harm you recentlyo No Do you feel uncomfortable with anyone in your familyo No Has anyone forced you do things that you didnt want to doo No Electronic Signature(s) Signed: 02/08/2023 4:26:34 PM By: Nathaniel Pulling RN, BSN Entered By: Nathaniel [name] on 02/08/2023 12:55:24 -------------------------------------------------------------------------------- Activities of Daily Living Details Patient Name: Date of Service: Nathaniel [name], [place] NA Nathaniel K. 02/08/2023 12:30 PM Medical Record Number: [id number] Patient Account Number: [id number] Date of Birth/Sex: Treating RN: 09/12/1986 (37 y.o. Nathaniel [name] Primary Care Nathaniel [name]: Nathaniel [name] Other Clinician: Referring Nathaniel [name]: Treating Nathaniel [name]/Extender: Nathaniel [name] Weeks in Treatment: 0 Activities of Daily Living Items Answer Activities of Daily Living (Please select one for each item) Drive Automobile Not Able T Medications ake Need Assistance Use T elephone Completely Able Care for Appearance Completely Able Use T oilet Completely Able Bath / Shower Completely Able Dress Self Completely Able Feed Self Completely Able Walk Completely Able Get In / Out Bed Completely Able Housework Completely Able Prepare Meals Completely Able Handle Money Completely Able Shop for Self Need Assistance Electronic  Signature(s) Signed: 02/08/2023 4:26:34 PM By: Nathaniel Pulling RN, BSN Entered By: Nathaniel [name] on 02/08/2023 12:56:13 -------------------------------------------------------------------------------- Education Screening Details Patient Name: Date of Service: Nathaniel [name], [place] NA Nathaniel K. 02/08/2023 12:30 PM Medical Record Number: [id number] Patient Account Number: [id number] Date of Birth/Sex: Treating RN: 08/10/1986 (37 y.o. Nathaniel [name] Primary Care Nathaniel [name]: Nathaniel [name] Other Clinician: Referring Nathaniel [name]: Treating Nathaniel [name]/Extender: Nathaniel [name], Nathaniel [name] Weeks in Treatment: 0 [name], [name] ([id number]) [id number] Page 2 of 4 Learning Preferences/Education Level/Primary Language Learning Preference: Explanation, Demonstration, Printed Material Preferred Language: English Cognitive Barrier Language Barrier: No Translator Needed: No Memory Deficit: No Emotional Barrier: No Cultural/Religious Beliefs Affecting Medical Care: No Physical Barrier Impaired Vision: No Impaired Hearing: No Decreased Hand dexterity: No Knowledge/Comprehension Knowledge Level: High Comprehension Level: High Ability to understand written instructions: High Ability to understand verbal instructions: High Motivation Anxiety Level: Calm Cooperation: Cooperative Education Importance: Acknowledges Need Interest in Health Problems: Asks Questions Perception: Coherent Willingness to Engage in Self-Management High Activities: Readiness to Engage in Self-Management High Activities: Electronic Signature(s) Signed: 02/08/2023 4:26:34 PM By: Nathaniel Pulling RN, BSN Entered By: Nathaniel [name] on 02/08/2023 12:56:50 -------------------------------------------------------------------------------- Fall Risk Assessment Details Patient Name: Date of Service: Nathaniel [name], [place] NA Nathaniel K. 02/08/2023 12:30 PM Medical Record Number: [id number] Patient Account Number:  [id number] Date of Birth/Sex: Treating RN: 1986/03/26 (37 y.o. Nathaniel [name] Primary Care Leanor Voris: Nathaniel [name] Other Clinician: Referring Nathaniel [name]: Treating Nathaniel [name]/Extender: Nathaniel [name] Weeks in Treatment: 0 Fall Risk Assessment Items Have you had 2 or more falls in the last 12 monthso 0 Yes Have you had any fall that resulted in injury in the last 12 monthso 0 Yes FALLS RISK SCREEN History of falling - immediate or within 3 months 25 Yes Secondary diagnosis (Do you have 2 or more medical diagnoseso) 15 Yes Ambulatory aid None/bed rest/wheelchair/nurse 0 No Crutches/cane/walker 15 Yes Furniture  0 No Intravenous therapy Access/Saline/Heparin Lock 0 No Gait/Transferring Normal/ bed rest/ wheelchair 0 No Weak (short steps with or without shuffle, stooped but able to lift head while walking, may seek 10 Yes support from furniture) Impaired (short steps with shuffle, may have difficulty arising from chair, head down, impaired 0 No balance) Mental Status Oriented to own ability 0 No Electronic Signature(s) Nathaniel [name] ([id number]) [id number] Page 3 of 4 Signed: 02/08/2023 4:26:34 PM By: Nathaniel Pulling RN, BSN Entered By: Nathaniel [name] on 02/08/2023 12:57:33 -------------------------------------------------------------------------------- Foot Assessment Details Patient Name: Date of Service: Nathaniel [name], [place] NA Nathaniel K. 02/08/2023 12:30 PM Medical Record Number: [id number] Patient Account Number: [id number] Date of Birth/Sex: Treating RN: 1986-04-11 (37 y.o. Nathaniel [name] Primary Care Nathaniel [name]: Nathaniel [name] Other Clinician: Referring Nathaniel [name]: Treating Nathaniel [name]/Extender: Nathaniel [name] Weeks in Treatment: 0 Foot Assessment Items Site Locations + = Sensation present, - = Sensation absent, C = Callus, U = Ulcer R = Redness, W = Warmth, M = Maceration, PU = Pre-ulcerative lesion F = Fissure, S  = Swelling, D = Dryness Assessment Right: Left: Other Deformity: No No Prior Foot Ulcer: No No Prior Amputation: No No Charcot Joint: No No Ambulatory Status: Gait: Electronic Signature(s) Signed: 02/08/2023 4:26:34 PM By: Nathaniel Pulling RN, BSN Entered By: Nathaniel [name] on 02/08/2023 12:58:06 -------------------------------------------------------------------------------- Nutrition Risk Screening Details Patient Name: Date of Service: Nathaniel [name], [place] NA Nathaniel K. 02/08/2023 12:30 PM Medical Record Number: [id number] Patient Account Number: [id number] Date of Birth/Sex: Treating RN: 10-15-1985 (37 y.o. Nathaniel [name] Primary Care Khloee Garza: Nathaniel [name] Other Clinician: Referring Nysha Koplin: Treating Jrake Rodriquez/Extender: Nathaniel [name] Weeks in Treatment: 0 Height (in): 71 Weight (lbs): 149 Body Mass Index (BMI): 20.8 TOMOHIRO, MAZZUCCO K ([id number]) [id number] Page 4 of 4 Nutrition Risk Screening Items Score Screening NUTRITION RISK SCREEN: I have an illness or condition that made me change the kind and/or amount of food I eat 0 No I eat fewer than two meals per day 0 No I eat few fruits and vegetables, or milk products 0 No I have three or more drinks of beer, liquor or wine almost every day 0 No I have tooth or mouth problems that make it hard for me to eat 0 No I don't always have enough money to buy the food I need 0 No I eat alone most of the time 0 No I take three or more different prescribed or over-the-counter drugs a day 1 Yes Without wanting to, I have lost or gained 10 pounds in the last six months 0 No I am not always physically able to shop, cook and/or feed myself 0 No Nutrition Protocols Good Risk Protocol Moderate Risk Protocol High Risk Proctocol Risk Level: Good Risk Score: 1 Electronic Signature(s) Signed: 02/08/2023 4:26:34 PM By: Nathaniel Pulling RN, BSN Entered By: Nathaniel [name] on 02/08/2023  12:58:01

## 2023-02-17 ENCOUNTER — Encounter (HOSPITAL_BASED_OUTPATIENT_CLINIC_OR_DEPARTMENT_OTHER): Payer: Medicare HMO | Admitting: Internal Medicine

## 2023-02-17 DIAGNOSIS — S31819A Unspecified open wound of right buttock, initial encounter: Secondary | ICD-10-CM

## 2023-02-17 DIAGNOSIS — E10622 Type 1 diabetes mellitus with other skin ulcer: Secondary | ICD-10-CM

## 2023-02-17 DIAGNOSIS — L98492 Non-pressure chronic ulcer of skin of other sites with fat layer exposed: Secondary | ICD-10-CM

## 2023-02-18 NOTE — Progress Notes (Signed)
YOHANCE, NORLANDER ([id number]) [id number] Page 1 of 7 Visit Report for 02/17/2023 Chief Complaint Document Details Patient Name: Date of Service: Nathaniel Killian [place] LD K. 02/17/2023 2:45 PM Medical Record Number: [id number] Patient Account Number: [id number] Date of Birth/Sex: Treating RN: 1986/06/07 (37 y.o. M) Primary Care Provider: Avon [name] Other Clinician: Referring Provider: Treating Provider/Extender: Nathaniel [name], Nathaniel [name] in Treatment: 1 Information Obtained from: Patient Chief Complaint 02/08/2023; right buttocks wound Electronic Signature(s) Signed: 02/17/2023 4:34:47 PM By: Nathaniel Corwin DO Entered By: Nathaniel [name] on 02/17/2023 15:53:50 -------------------------------------------------------------------------------- HPI Details Patient Name: Date of Service: Nathaniel [name], Nathaniel NA LD K. 02/17/2023 2:45 PM Medical Record Number: [id number] Patient Account Number: [id number] Date of Birth/Sex: Treating RN: Dec 27, 1985 (37 y.o. M) Primary Care Provider: Avon [name] Other Clinician: Referring Provider: Treating Provider/Extender: Nathaniel [name] [name] in Treatment: 1 History of Present Illness HPI Description: 02/08/2023 Nathaniel [name] is a 37 year old male with a past medical history of uncontrolled type 1 diabetes on insulin, CVA and neurosyphilis that presents the clinic for a 60-month history of right buttocks wound secondary to necrotizing soft tissue infection. On 09/12/2022 he was admitted to the hospital for necrotizing soft tissue infection of the right buttocks and had OR debridement with a wound VAC and given IV antibiotics. His admission was complicated by diabetic ketoacidosis and required ICU care with intubation. He was subsequently discharged to a skilled nursing facility. Unfortunately he was readmitted to the hospital on 10/18/2022 with cardiac arrest and found to be in septic shock.  This was thought to be due to hyperkalemia from DKA. At that time the wound was noted to be well-healing with no obvious signs of infection. For the past several months he has had home health changing the dressing 3 times weekly with silver alginate. He currently denies signs of infection. 5/30; patient presents for follow-up. He has been using Hydrofera Blue to the wound bed being changed 3 times weekly with home health. He has no issues or complaints today. Wound is smaller. Electronic Signature(s) Signed: 02/17/2023 4:34:47 PM By: Nathaniel Corwin DO Entered By: Nathaniel [name] on 02/17/2023 15:54:12 -------------------------------------------------------------------------------- Chemical Cauterization Details Patient Name: Date of Service: Nathaniel [name], Nathaniel NA LD K. 02/17/2023 2:45 PM Medical Record Number: [id number] Patient Account Number: [id number] Date of Birth/Sex: Treating RN: May 10, 1986 (37 y.o. Nathaniel [name] Primary Care Provider: Avon [name] Other Clinician: Referring Provider: Treating Provider/Extender: Nathaniel [name] [name] in Treatment: 1 Procedure Performed for: Wound #1 Right Gluteus Performed By: Physician Nathaniel Corwin, DO Post Procedure Diagnosis Same as Pre-procedure Nathaniel [name] ([id number]) [id number] Page 2 of 7 Notes silver nitrate used. Electronic Signature(s) Signed: 02/17/2023 4:34:47 PM By: Nathaniel Corwin DO Signed: 02/17/2023 4:57:37 PM By: Nathaniel Stall RN, BSN Entered By: Nathaniel [name] on 02/17/2023 15:37:46 -------------------------------------------------------------------------------- Physical Exam Details Patient Name: Date of Service: Nathaniel [name], Nathaniel NA LD K. 02/17/2023 2:45 PM Medical Record Number: [id number] Patient Account Number: [id number] Date of Birth/Sex: Treating RN: 01-Jan-1986 (37 y.o. M) Primary Care Provider: Avon [name] Other Clinician: Referring Provider: Treating  Provider/Extender: Nathaniel [name], Nathaniel [name] [name] in Treatment: 1 Constitutional respirations regular, non-labored and within target range for patient.Marland Kitchen Psychiatric pleasant and cooperative. Notes T the lateral aspect of the Over the right buttocks there is an open wound with granulation tissue and hyper granulated areas. Circumferentially there is o epithelized tissue. No signs of infection including increased warmth, erythema or purulent drainage. Electronic Signature(s) Signed: 02/17/2023 4:34:47 PM By:  Nathaniel Corwin DO Entered By: Nathaniel [name] on 02/17/2023 15:54:53 -------------------------------------------------------------------------------- Physician Orders Details Patient Name: Date of Service: [facility], [name] 02/17/2023 2:45 PM Medical Record Number: [id number] Patient Account Number: [id number] Date of Birth/Sex: Treating RN: [birth year] (37 y.o. Nathaniel [name] Primary Care Provider: Avon [name] Other Clinician: Referring Provider: Treating Provider/Extender: Nathaniel [name] [name] in Treatment: 1 Verbal / Phone Orders: No Diagnosis Coding ICD-10 Coding Code Description [phone] Non-pressure chronic ulcer of skin of other sites with fat layer exposed S31.819A Unspecified open wound of right buttock, initial encounter E10.622 Type 1 diabetes mellitus with other skin ulcer L02.91 Cutaneous abscess, unspecified F12.10 Cannabis abuse, uncomplicated Follow-up Appointments ppointment in 1 week. - Dr Nathaniel [name] - 215pm 03/01/2023 room 8 Tuesday Return A Anesthetic Wound #1 Right Gluteus (In clinic) Topical Lidocaine 5% applied to wound bed Bathing/ Shower/ Hygiene May shower and wash wound with soap and water. - apply clean dressing after washing Off-Loading Wound #1 Right Gluteus Turn and reposition every 2 hours [name], [name] ([id number]) [id number] Page 3 of 7 Home Health Wound #1 Right  Gluteus New wound care orders this week; continue Home Health for wound care. May utilize formulary equivalent dressing for wound treatment orders unless otherwise specified. - change dressing to hydrofera blue to be changed 3xwk; please ensure the hydrofera blue ready (if used) writing is side up. Dressing changes to be completed by Home Health on Monday / Wednesday / Friday except when patient has scheduled visit at [facility]. Other Home Health Orders/Instructions: - [facility] Wound Treatment Wound #1 - Gluteus Wound Laterality: Right Cleanser: Soap and Water 3 x Per Week/30 Days Discharge Instructions: May shower and wash wound with dial antibacterial soap and water prior to dressing change. Cleanser: Wound Cleanser 3 x Per Week/30 Days Discharge Instructions: Cleanse the wound with wound cleanser prior to applying a clean dressing using gauze sponges, not tissue or cotton balls. Peri-Wound Care: Skin Prep 3 x Per Week/30 Days Discharge Instructions: Use skin prep as directed Prim Dressing: Hydrofera Blue Ready Transfer Foam, 4x5 (in/in) 3 x Per Week/30 Days ary Discharge Instructions: Apply to wound bed as instructed Secondary Dressing: Zetuvit Plus Silicone Border Sacrum Dressing, Sm, 7x7 (in/in) 3 x Per Week/30 Days Discharge Instructions: Apply silicone border over primary dressing as directed. Electronic Signature(s) Signed: 02/17/2023 4:34:47 PM By: Nathaniel Corwin DO Entered By: Nathaniel [name] on 02/17/2023 15:55:01 -------------------------------------------------------------------------------- Problem List Details Patient Name: Date of Service: Nathaniel [name], Nathaniel NA LD K. 02/17/2023 2:45 PM Medical Record Number: [id number] Patient Account Number: [id number] Date of Birth/Sex: Treating RN: 1985-12-12 (37 y.o. Nathaniel [name] Primary Care Provider: Avon [name] Other Clinician: Referring Provider: Treating Provider/Extender: Nathaniel [name] [name] in  Treatment: 1 Active Problems ICD-10 Encounter Code Description Active Date MDM Diagnosis [phone] Non-pressure chronic ulcer of skin of other sites with fat layer exposed 02/08/2023 No Yes S31.819A Unspecified open wound of right buttock, initial encounter 02/08/2023 No Yes E10.622 Type 1 diabetes mellitus with other skin ulcer 02/08/2023 No Yes L02.91 Cutaneous abscess, unspecified 02/08/2023 No Yes F12.10 Cannabis abuse, uncomplicated 02/08/2023 No Yes Inactive Problems Resolved Problems ARSHAN, VILLAREAL ([id number]) [id number] Page 4 of 7 Electronic Signature(s) Signed: 02/17/2023 4:34:47 PM By: Nathaniel Corwin DO Entered By: Nathaniel [name] on 02/17/2023 15:53:37 -------------------------------------------------------------------------------- Progress Note Details Patient Name: Date of Service: Nathaniel [name], Nathaniel NA LD K. 02/17/2023 2:45 PM Medical Record Number: [id number] Patient Account Number: [id number] Date of Birth/Sex: Treating RN:  07-08-1986 (36 y.o. M) Primary Care Provider: Avon [name] Other Clinician: Referring Provider: Treating Provider/Extender: Nathaniel [name] [name] in Treatment: 1 Subjective Chief Complaint Information obtained from Patient 02/08/2023; right buttocks wound History of Present Illness (HPI) 02/08/2023 Mr. Abhimanyu Strzelczyk is a 37 year old male with a past medical history of uncontrolled type 1 diabetes on insulin, CVA and neurosyphilis that presents the clinic for a 68-month history of right buttocks wound secondary to necrotizing soft tissue infection. On 09/12/2022 he was admitted to the hospital for necrotizing soft tissue infection of the right buttocks and had OR debridement with a wound VAC and given IV antibiotics. His admission was complicated by diabetic ketoacidosis and required ICU care with intubation. He was subsequently discharged to a skilled nursing facility. Unfortunately he was  readmitted to the hospital on 10/18/2022 with cardiac arrest and found to be in septic shock. This was thought to be due to hyperkalemia from DKA. At that time the wound was noted to be well-healing with no obvious signs of infection. For the past several months he has had home health changing the dressing 3 times weekly with silver alginate. He currently denies signs of infection. 5/30; patient presents for follow-up. He has been using Hydrofera Blue to the wound bed being changed 3 times weekly with home health. He has no issues or complaints today. Wound is smaller. Patient History Information obtained from Patient. Family History Cancer - Maternal Grandparents, Diabetes - Father,Maternal Grandparents,Paternal Grandparents, Hypertension - Mother, Kidney Disease - Father. Social History Current every day smoker - 1/2 ppd, Marital Status - Single, Alcohol Use - Never, Drug Use - Current History - marijuana, Caffeine Use - Daily. Medical History Cardiovascular Patient has history of Hypertension Endocrine Patient has history of Type I Diabetes Denies history of Type II Diabetes Medical A Surgical History Notes nd Constitutional Symptoms (General Health) Stroke 2020 Eyes retinopathy left eye Cardiovascular cardiac arrest 09/2022 Genitourinary CKD stage III Neurologic CVA 2019 Objective Constitutional respirations regular, non-labored and within target range for patient.. Vitals Time Taken: 3:15 PM, Height: 71 in, Weight: 149 lbs, BMI: 20.8, Capillary Blood Glucose: 174 mg/dl. Psychiatric pleasant and cooperative. WILBERTO, HOYOS ([id number]) [id number] Page 5 of 7 General Notes: T the lateral aspect of the Over the right buttocks there is an open wound with granulation tissue and hyper granulated areas. Circumferentially o there is epithelized tissue. No signs of infection including increased warmth, erythema or purulent drainage. Integumentary  (Hair, Skin) Wound #1 status is Open. Original cause of wound was Gradually Appeared. The date acquired was: 09/12/2022. The wound has been in treatment 1 [name]. The wound is located on the Right Gluteus. The wound measures 8.5cm length x 6.8cm width x 0.1cm depth; 45.396cm^2 area and 4.54cm^3 volume. There is Fat Layer (Subcutaneous Tissue) exposed. There is no tunneling or undermining noted. There is a medium amount of serosanguineous drainage noted. The wound margin is distinct with the outline attached to the wound base. There is large (67-100%) red, hyper - granulation within the wound bed. There is a small (1- 33%) amount of necrotic tissue within the wound bed including Adherent Slough. The periwound skin appearance exhibited: Scarring. The periwound skin appearance did not exhibit: Callus, Crepitus, Excoriation, Induration, Rash, Dry/Scaly, Maceration, Atrophie Blanche, Cyanosis, Ecchymosis, Hemosiderin Staining, Mottled, Pallor, Rubor, Erythema. Assessment Active Problems ICD-10 Non-pressure chronic ulcer of skin of other sites with fat layer exposed Unspecified open wound of right buttock, initial encounter Type 1 diabetes mellitus with other skin ulcer Cutaneous  abscess, unspecified Cannabis abuse, uncomplicated Patient's wound appears well-healing. A silver nitrate to the hyper granulated areas. Wound is smaller. I recommended continue Hydrofera Blue. No signs of infection. Continue aggressive offloading. Follow-up in 1-2 [name]. Procedures Wound #1 Pre-procedure diagnosis of Wound #1 is an Abscess located on the Right Gluteus . An Chemical Cauterization procedure was performed by Nathaniel Corwin, DO. Post procedure Diagnosis Wound #1: Same as Pre-Procedure Notes: silver nitrate used. Plan Follow-up Appointments: Return Appointment in 1 week. - Dr Nathaniel [name] - 215pm 03/01/2023 room 8 Tuesday Anesthetic: Wound #1 Right Gluteus: (In clinic) Topical Lidocaine 5% applied to wound  bed Bathing/ Shower/ Hygiene: May shower and wash wound with soap and water. - apply clean dressing after washing Off-Loading: Wound #1 Right Gluteus: Turn and reposition every 2 hours Home Health: Wound #1 Right Gluteus: New wound care orders this week; continue Home Health for wound care. May utilize formulary equivalent dressing for wound treatment orders unless otherwise specified. - change dressing to hydrofera blue to be changed 3xwk; please ensure the hydrofera blue ready (if used) writing is side up. Dressing changes to be completed by Home Health on Monday / Wednesday / Friday except when patient has scheduled visit at [facility]. Other Home Health Orders/Instructions: - [facility] WOUND #1: - Gluteus Wound Laterality: Right Cleanser: Soap and Water 3 x Per Week/30 Days Discharge Instructions: May shower and wash wound with dial antibacterial soap and water prior to dressing change. Cleanser: Wound Cleanser 3 x Per Week/30 Days Discharge Instructions: Cleanse the wound with wound cleanser prior to applying a clean dressing using gauze sponges, not tissue or cotton balls. Peri-Wound Care: Skin Prep 3 x Per Week/30 Days Discharge Instructions: Use skin prep as directed Prim Dressing: Hydrofera Blue Ready Transfer Foam, 4x5 (in/in) 3 x Per Week/30 Days ary Discharge Instructions: Apply to wound bed as instructed Secondary Dressing: Zetuvit Plus Silicone Border Sacrum Dressing, Sm, 7x7 (in/in) 3 x Per Week/30 Days Discharge Instructions: Apply silicone border over primary dressing as directed. 1. Silver nitrate 2. Hydrofera Blue 3. Aggressive offloading 4. Follow-up in 1-2 [name] JAIDEV, SUDBERRY ([id number]) [id number] Page 6 of 7 Electronic Signature(s) Signed: 02/17/2023 4:34:47 PM By: Nathaniel Corwin DO Entered By: Nathaniel [name] on 02/17/2023 15:55:38 -------------------------------------------------------------------------------- HxROS  Details Patient Name: Date of Service: Nathaniel [name], Nathaniel NA LD K. 02/17/2023 2:45 PM Medical Record Number: [id number] Patient Account Number: [id number] Date of Birth/Sex: Treating RN: December 28, 1985 (37 y.o. M) Primary Care Provider: Avon [name] Other Clinician: Referring Provider: Treating Provider/Extender: Nathaniel [name] [name] in Treatment: 1 Information Obtained From Patient Constitutional Symptoms (General Health) Medical History: Past Medical History Notes: Stroke 2020 Eyes Medical History: Past Medical History Notes: retinopathy left eye Cardiovascular Medical History: Positive for: Hypertension Past Medical History Notes: cardiac arrest 09/2022 Endocrine Medical History: Positive for: Type I Diabetes Negative for: Type II Diabetes Treated with: Insulin Blood sugar tested every day: Yes Tested : Genitourinary Medical History: Past Medical History Notes: CKD stage III Neurologic Medical History: Past Medical History Notes: CVA 2019 Immunizations Pneumococcal Vaccine: Received Pneumococcal Vaccination: No Implantable Devices None Family and Social History Cancer: Yes - Maternal Grandparents; Diabetes: Yes - Father,Maternal Grandparents,Paternal Grandparents; Hypertension: Yes - Mother; Kidney Disease: Yes - Father; Current every day smoker - 1/2 ppd; Marital Status - Single; Alcohol Use: Never; Drug Use: Current History - marijuana; Caffeine Use: Daily; Financial Concerns: No; Food, Clothing or Shelter Needs: No; Support System Lacking: No; Transportation Concerns: No Electronic Signature(s) Signed: 02/17/2023  4:34:47 PM By: Nathaniel Corwin DO Entered By: Nathaniel [name] on 02/17/2023 15:54:19 Sherrill Raring ([id number]) [id number] Page 7 of 7 -------------------------------------------------------------------------------- SuperBill Details Patient Name: Date of Service: Nathaniel [name], [name]  02/17/2023 Medical Record Number: [id number] Patient Account Number: [id number] Date of Birth/Sex: Treating RN: 30-Aug-1986 (37 y.o. Nathaniel [name] Primary Care Provider: Avon [name] Other Clinician: Referring Provider: Treating Provider/Extender: Nathaniel [name] [name] in Treatment: 1 Diagnosis Coding ICD-10 Codes Code Description [phone] Non-pressure chronic ulcer of skin of other sites with fat layer exposed S31.819A Unspecified open wound of right buttock, initial encounter E10.622 Type 1 diabetes mellitus with other skin ulcer L02.91 Cutaneous abscess, unspecified F12.10 Cannabis abuse, uncomplicated Facility Procedures : CPT4 Code: [id number] Description: 17250 - CHEM CAUT GRANULATION TISS ICD-10 Diagnosis Description L98.492 Non-pressure chronic ulcer of skin of other sites with fat layer exposed S31.819A Unspecified open wound of right buttock, initial encounter E10.622 Type 1 diabetes  mellitus with other skin ulcer Modifier: Quantity: 1 Physician Procedures : CPT4 Code Description Modifier [id number] - WC PHYS CHEM CAUT GRAN TISSUE ICD-10 Diagnosis Description L98.492 Non-pressure chronic ulcer of skin of other sites with fat layer exposed S31.819A Unspecified open wound of right buttock, initial  encounter E10.622 Type 1 diabetes mellitus with other skin ulcer Quantity: 1 Electronic Signature(s) Signed: 02/17/2023 4:34:47 PM By: Nathaniel Corwin DO Entered By: Nathaniel [name] on 02/17/2023 15:55:52

## 2023-02-21 ENCOUNTER — Encounter: Payer: Self-pay | Admitting: Endocrinology

## 2023-02-21 ENCOUNTER — Ambulatory Visit (INDEPENDENT_AMBULATORY_CARE_PROVIDER_SITE_OTHER): Payer: Medicare HMO | Admitting: Endocrinology

## 2023-02-21 VITALS — BP 110/60 | HR 99 | Ht 71.0 in | Wt 146.2 lb

## 2023-02-21 DIAGNOSIS — E1065 Type 1 diabetes mellitus with hyperglycemia: Secondary | ICD-10-CM

## 2023-02-21 DIAGNOSIS — I10 Essential (primary) hypertension: Secondary | ICD-10-CM

## 2023-02-21 LAB — POCT GLYCOSYLATED HEMOGLOBIN (HGB A1C): Hemoglobin A1C: 6.9 % — AB (ref 4.0–5.6)

## 2023-02-21 NOTE — Progress Notes (Signed)
Patient ID: Nathaniel [name], male   DOB: 29-Aug-1986, 37 y.o.   MRN: [id number]           Reason for Appointment : Follow-up for Type 1 Diabetes  History of Present Illness           Diagnosis: Type 1 diabetes mellitus, date of diagnosis:  ?  2004        Previous history:  He has had recurrent hospitalizations for ketoacidosis especially in 2017 and 2018, last episode was in 01/2018 Overall has had consistently poor control with A1c as high as 16 In the past he has required large doses of at least basal insulin, taking up to 200 units of Lantus daily at some point  Recent history:   INSULIN regimen is:  [place] 28 daily Novolog usually 3-5/7 units at meals   His A1c is 6.9, previously 7.6   Current management, blood sugar patterns and problems identified:    On the last visit he was changed back to to [place] instead of glargine insulin twice a day Also had increased insulin requirement His blood sugars are highly variable although on an average lower than before: Previously on his visit he had not been using the Dexcom sensor which was restarted with help Most significant pattern recently is marked hypoglycemia occurring mostly in the afternoons and early evening This is almost always preceded by high readings and appears to be taking excessive correction doses of as much is 10 units as blood sugars are in the 203 100 range Blood sugars are generally somewhat higher in the second week compared to the first week of his data Difficult to assess his mealtime control since his meals are variable and at different times Also postprandial readings are inconsistent Yesterday with eating fried food at a restaurant followed by high-fat snack his blood sugars went up to nearly 350 This morning his sugar was low because of overcorrection He was recommended the OmniPod insulin pump but it appears that his Medicare did not approve this and unclear why   Interpretation of the Dexcom  sensor download for the last 2 weeks, the time on his reader was verified as being accurate   Overall blood sugars are relatively higher overnight with some variability but very frequently has episodes of hypoglycemia in the late afternoons and evenings  However most of the hypoglycemia he had has been in the first week or so and only twice in the last week HIGHEST blood sugars are overall late at night around 6 AM and sometimes 11 AM which is still his sleeping time  Significant variability is present and blood sugars are overall much higher in the last week compared to previous week HYPOGLYCEMIA is sometimes prolonged and blood sugar may stay low for a few hours in the afternoon; frequently did not appear to be preceded by high sugars prior to dropping Minimal hypoglycemia in the last 2 days mostly after midnight and also today this afternoon HYPERGLYCEMIC episodes are occurring frequently during the night at different times with average blood sugar around 200 at those times Also may rebound from low sugars to higher readings at times  Postprandial readings in the evenings are highly variable but generally much higher in the last 2 days compared to previous days when they were low   GMI 7.0  Statistics:  CGM use % of time   2-week average/GV 150/51  Time in range      44%  % Time Above 180 26+12  %  Time above 250   % Time Below 70 7+11   Previously:   CGM use % of time   2-week average/GV 187/41  Time in range     45   %  % Time Above 180 31  % Time above 250 19  % Time Below 70 5     Hypoglycemia:  As above Symptoms of hypoglycemia: Feeling tired, sweaty, slurred speech, weakness Treatment of hypoglycemia: Juice usually, sometimes glucose tablets, usually 2 at a time     Self-care: The diet that the patient has been following is: None  Mealtimes are: Very variable              Dietician consultation: Most recent: 06/2018     CDE consultation: Years ago  Diabetes  labs:  Lab Results  Component Value Date   HGBA1C 6.9 (A) 02/21/2023   HGBA1C 7.6 (H) 10/19/2022   HGBA1C 9.4 01/21/2022   Lab Results  Component Value Date   LDLCALC 138 (H) 05/18/2021   CREATININE 3.13 (H) 01/14/2023    No results found for: "MICRALBCREAT"  Wt Readings from Last 3 Encounters:  02/21/23 146 lb 3.2 oz (66.3 kg)  01/27/23 149 lb (67.6 kg)  01/14/23 147 lb (66.7 kg)     Allergies as of 02/21/2023       Reactions   Fructose Other (See Comments)   Increase of blood sugar        Medication List        Accurate as of February 21, 2023  8:37 PM. If you have any questions, ask your nurse or doctor.          acetaminophen 325 MG tablet Commonly known as: TYLENOL Take 1-2 tablets (325-650 mg total) by mouth every 4 (four) hours as needed for mild pain.   amLODipine 10 MG tablet Commonly known as: NORVASC Take 1 tablet (10 mg total) by mouth daily. For blood pressure   Aspirin Low Dose 81 MG tablet Generic drug: aspirin EC Take 81 mg by mouth every morning.   atorvastatin 80 MG tablet Commonly known as: LIPITOR Take 1 tablet (80 mg total) by mouth every evening. For stroke prevention   clopidogrel 75 MG tablet Commonly known as: PLAVIX Take 1 tablet (75 mg total) by mouth daily. For stroke prevention   Dexcom G6 Sensor Misc 1 each by Does not apply route every 14 (fourteen) days.   Dexcom G6 Transmitter Misc Check sugar 4 times a day   doxycycline 100 MG capsule Commonly known as: VIBRAMYCIN Take 1 capsule (100 mg total) by mouth 2 (two) times daily.   ferrous sulfate 325 (65 FE) MG tablet Take 325 mg by mouth daily with breakfast.   GLUCERNA 1.5 CAL PO Take 237 mLs by mouth daily.   insulin aspart 100 UNIT/ML FlexPen Commonly known as: NOVOLOG Inject per sliding scale before meals: CBG 70 - 120: 0 units CBG 121 - 150: 1 unit CBG 151 - 200: 2 units CBG 201 - 250: 3 units CBG 251 - 300: 5 units CBG 301 - 350: 7 units CBG 351 - 400: 9 units  CBG >400: 9 units and call doctor   labetalol 100 MG tablet Commonly known as: NORMODYNE Take 1 tablet (100 mg total) by mouth 2 (two) times daily. For BP   multivitamin tablet Take 1 tablet by mouth daily.   Omnipod 5 G6 Pods (Gen 5) Misc 1 Device by Does not apply route every 3 (three) days.   OneTouch  Ultra test strip Generic drug: glucose blood USE TO TEST BLOOD SUGAR 4 TIMES A DAY   Pen Needles 3/16" 31G X 5 MM Misc Four times day   polyethylene glycol 17 g packet Commonly known as: MIRALAX / GLYCOLAX Take 17 g by mouth daily.   sodium zirconium cyclosilicate 10 g Pack packet Commonly known as: LOKELMA Take 10 g by mouth daily.   Evaristo Bury FlexTouch 100 UNIT/ML FlexTouch Pen Generic drug: insulin degludec Inject 28 Units into the skin daily.   Vitamin D (Ergocalciferol) 1.25 MG (50000 UNIT) Caps capsule Commonly known as: DRISDOL Take 1 capsule (50,000 Units total) by mouth once a week. What changed: when to take this        Allergies:  Allergies  Allergen Reactions   Fructose Other (See Comments)    Increase of blood sugar     Past Medical History:  Diagnosis Date   Diabetes mellitus    lantus/novolog   Hyperlipidemia    Hypertension    Marijuana abuse    occaisionally   Noncompliance with medication regimen    Stroke (HCC) 2020   Tobacco abuse    5/day    Past Surgical History:  Procedure Laterality Date   AV FISTULA PLACEMENT Left 01/08/2021   Procedure: LEFT ARM ARTERIOVENOUS (AV) FISTULA CREATION;  Surgeon: Larina Earthly, MD;  Location: [facility];  Service: Vascular;  Laterality: Left;   EYE SURGERY      Family History  Problem Relation Age of Onset   Diabetes Father    Kidney failure Father        last 4 mnths of life   Hypertension Mother    Diabetes Maternal Grandmother    Pancreatic cancer Maternal Grandfather    Diabetes Paternal Grandfather     Social History:  reports that he has been smoking cigarettes. He has a 4.00  pack-year smoking history. He has never used smokeless tobacco. He reports current drug use. Drug: Marijuana. He reports that he does not drink alcohol.      Review of Systems      Lipids: After his CVA in June 2019 he was started on Lipitor 80 mg Last LDL from PCP is 72  Lab Results  Component Value Date   CHOL 204 (H) 05/18/2021   HDL 37.60 (L) 05/18/2021   LDLCALC 138 (H) 05/18/2021   TRIG 142.0 05/18/2021   CHOLHDL 5 05/18/2021   HYPERTENSION: Has been on multiple drugs for several years from nephrologist  BP Readings from Last 3 Encounters:  02/21/23 110/60  01/27/23 115/74  01/14/23 (!) 148/81    Has chronic kidney disease, seen by nephrologist regularly and has not been advised dialysis as yet  Lab Results  Component Value Date   CREATININE 3.13 (H) 01/14/2023   CREATININE 2.88 (H) 10/25/2022   CREATININE 3.15 (H) 10/24/2022   He has had a thyroid nodule on the right side, ultrasound has been ordered in the past but he has not made appointment  Lab Results  Component Value Date   TSH 0.762 07/14/2020   Last eye exam with Dr Dione Booze 4/23   Physical Examination:  BP 110/60   Pulse 99   Ht 5\' 11"  (1.803 m)   Wt 146 lb 3.2 oz (66.3 kg)   SpO2 99%   BMI 20.39 kg/m     ASSESSMENT:  Diabetes type 1, persistently poorly controlled  See history of present illness for detailed discussion of current diabetes management, blood sugar patterns and problems identified  A1c is 6.9 compared to 7.6  Current regimen is TRESIBA once a day and NovoLog at meals with correction doses  Recent Dexcom was reviewed  His blood sugars are difficult to control with because of excessive hypoglycemia now and also periods of hyperglycemia His hypoglycemia is generally related to overcorrection of high readings with as much is 10 units NovoLog This is despite taking only 3 to 4 units usually for mealtime coverage Overall blood sugar pattern is quite erratic with high readings  overnight and high variability  Also unclear why his OmniPod insulin pump prescription was not approved by his insurance  PLAN:  Since it is unclear whether he needs to change his Evaristo Bury we will continue the same dose of 28 units especially since recently low sugars are not present including overnight However he was educated about when to take correction doses for NovoLog and how much Continue using Dexcom  Discussed needing to increase his NovoLog significantly but larger meals or high fat foods up to 10 units Otherwise for full meals he needs to take an average dose of 5 units Also to take NovoLog consistently at the time of his meal either before or right after  To make corrections for the glucose values over 250 only with taking 2 to 4 units at bedtime instead of 10 units that he is taking Also can add similar doses if Premeal blood sugars are over 200 Discussed dangers of hypoglycemia especially with his history of stroke  His mother will contact the local representative of [employer] to see if his insulin pump can be covered a different way  Patient Instructions  Take 2 units for sugar 200, 3 U for 300 and 4 units forover 350  Meal time 3-8 units   Reather Littler 02/21/2023, 8:37 PM      Note: This note was prepared with Dragon voice recognition system technology. Any transcriptional errors that result from this process are unintentional.

## 2023-02-21 NOTE — Patient Instructions (Addendum)
Take 2 units for sugar 200, 3 U for 300 and 4 units for over 350  Meal time 3-8 units

## 2023-02-23 ENCOUNTER — Encounter: Payer: Self-pay | Admitting: Endocrinology

## 2023-02-28 ENCOUNTER — Other Ambulatory Visit: Payer: Self-pay | Admitting: Endocrinology

## 2023-02-28 DIAGNOSIS — E1065 Type 1 diabetes mellitus with hyperglycemia: Secondary | ICD-10-CM

## 2023-02-28 MED ORDER — OMNIPOD 5 DEXG7G6 PODS GEN 5 MISC
1.0000 | 3 refills | Status: DC
Start: 2023-02-28 — End: 2023-07-19

## 2023-02-28 MED ORDER — OMNIPOD 5 DEXG7G6 INTRO GEN 5 KIT: 1.0000 | PACK | Freq: Once | 0 refills | Status: AC

## 2023-02-28 NOTE — Telephone Encounter (Signed)
They aren't set up to bill Medical, only pharmacy benefit, like our pharmacies. If this hasn't been taken care of, he may need to call his ins to find out where they will pay for it at. Lots of insurances have preferred pharmacies or DME providers.

## 2023-03-01 ENCOUNTER — Encounter (HOSPITAL_BASED_OUTPATIENT_CLINIC_OR_DEPARTMENT_OTHER): Payer: Medicare HMO | Attending: Internal Medicine | Admitting: Internal Medicine

## 2023-03-01 DIAGNOSIS — N183 Chronic kidney disease, stage 3 unspecified: Secondary | ICD-10-CM | POA: Diagnosis not present

## 2023-03-01 DIAGNOSIS — Z833 Family history of diabetes mellitus: Secondary | ICD-10-CM | POA: Insufficient documentation

## 2023-03-01 DIAGNOSIS — E10622 Type 1 diabetes mellitus with other skin ulcer: Secondary | ICD-10-CM | POA: Diagnosis present

## 2023-03-01 DIAGNOSIS — I129 Hypertensive chronic kidney disease with stage 1 through stage 4 chronic kidney disease, or unspecified chronic kidney disease: Secondary | ICD-10-CM | POA: Diagnosis not present

## 2023-03-01 DIAGNOSIS — E1022 Type 1 diabetes mellitus with diabetic chronic kidney disease: Secondary | ICD-10-CM | POA: Diagnosis not present

## 2023-03-01 DIAGNOSIS — Z794 Long term (current) use of insulin: Secondary | ICD-10-CM | POA: Insufficient documentation

## 2023-03-01 DIAGNOSIS — Z8673 Personal history of transient ischemic attack (TIA), and cerebral infarction without residual deficits: Secondary | ICD-10-CM | POA: Diagnosis not present

## 2023-03-01 DIAGNOSIS — L98492 Non-pressure chronic ulcer of skin of other sites with fat layer exposed: Secondary | ICD-10-CM | POA: Insufficient documentation

## 2023-03-01 DIAGNOSIS — E10319 Type 1 diabetes mellitus with unspecified diabetic retinopathy without macular edema: Secondary | ICD-10-CM | POA: Diagnosis not present

## 2023-03-01 DIAGNOSIS — S31819A Unspecified open wound of right buttock, initial encounter: Secondary | ICD-10-CM | POA: Insufficient documentation

## 2023-03-02 NOTE — Progress Notes (Signed)
REYNOL, SAMONS ([id number]) [id number] Page 1 of 8 Visit Report for 03/01/2023 Chief Complaint Document Details Patient Name: Date of Service: Nathaniel [name], Nathaniel NA LD K. 03/01/2023 2:15 PM Medical Record Number: [id number] Patient Account Number: [id number] Date of Birth/Sex: Treating RN: [birth year] (37 y.o. M) Primary Care Provider: Avon Gully Other Clinician: Referring Provider: Treating Provider/Extender: Nathaniel [name] Weeks in Treatment: 3 Information Obtained from: Patient Chief Complaint 02/08/2023; right buttocks wound Electronic Signature(s) Signed: 03/01/2023 4:34:44 PM By: Geralyn Corwin DO Entered By: Geralyn Corwin on 03/01/2023 15:40:03 -------------------------------------------------------------------------------- Debridement Details Patient Name: Date of Service: Nathaniel [name], Nathaniel NA LD K. 03/01/2023 2:15 PM Medical Record Number: [id number] Patient Account Number: [id number] Date of Birth/Sex: Treating RN: 28-Dec-1985 (37 y.o. Nathaniel [name] Primary Care Provider: Avon Gully Other Clinician: Referring Provider: Treating Provider/Extender: Nathaniel [name] Weeks in Treatment: 3 Debridement Performed for Assessment: Wound #1 Right Gluteus Performed By: Physician Geralyn Corwin, DO Debridement Type: Debridement Level of Consciousness (Pre-procedure): Awake and Alert Pre-procedure Verification/Time Out Yes - 14:35 Taken: Start Time: 14:36 Pain Control: Lidocaine 4% T opical Solution Percent of Wound Bed Debrided: 100% T Area Debrided (cm): otal 30.1 Tissue and other material debrided: Viable, Non-Viable, Slough, Subcutaneous, Skin: Dermis , Skin: Epidermis, Biofilm, Slough, Hyper-granulation Level: Skin/Subcutaneous Tissue Debridement Description: Excisional Instrument: Curette Bleeding: Minimum Hemostasis Achieved: Silver Nitrate End Time: 14:43 Procedural Pain: 0 Post Procedural  Pain: 0 Response to Treatment: Procedure was tolerated well Level of Consciousness (Post- Awake and Alert procedure): Post Debridement Measurements of Total Wound Length: (cm) 6.5 Width: (cm) 5.9 Depth: (cm) 0.1 Volume: (cm) 3.012 Character of Wound/Ulcer Post Debridement: Improved Post Procedure Diagnosis Same as Pre-procedure Electronic Signature(s) Signed: 03/01/2023 3:34:20 PM By: Geralyn Corwin DO Signed: 03/01/2023 6:01:11 PM By: Shawn Stall RN, BSN Entered By: Shawn Stall on 03/01/2023 14:43:41 Nathaniel [name] ([id number]) [id number] Page 2 of 8 -------------------------------------------------------------------------------- HPI Details Patient Name: Date of Service: Nathaniel [name], Nathaniel NA LD K. 03/01/2023 2:15 PM Medical Record Number: [id number] Patient Account Number: [id number] Date of Birth/Sex: Treating RN: 07/15/1986 (37 y.o. M) Primary Care Provider: Avon Gully Other Clinician: Referring Provider: Treating Provider/Extender: Nathaniel [name] Weeks in Treatment: 3 History of Present Illness HPI Description: 02/08/2023 Nathaniel [name] is a 37 year old male with a past medical history of uncontrolled type 1 diabetes on insulin, CVA and neurosyphilis that presents the clinic for a 20-month history of right buttocks wound secondary to necrotizing soft tissue infection. On 09/12/2022 he was admitted to the hospital for necrotizing soft tissue infection of the right buttocks and had OR debridement with a wound VAC and given IV antibiotics. His admission was complicated by diabetic ketoacidosis and required ICU care with intubation. He was subsequently discharged to a skilled nursing facility. Unfortunately he was readmitted to the hospital on 10/18/2022 with cardiac arrest and found to be in septic shock. This was thought to be due to hyperkalemia from DKA. At that time the wound was noted to be well-healing with no  obvious signs of infection. For the past several months he has had home health changing the dressing 3 times weekly with silver alginate. He currently denies signs of infection. 5/30; patient presents for follow-up. He has been using Hydrofera Blue to the wound bed being changed 3 times weekly with home health. He has no issues or complaints today. Wound is smaller. 6/11; patient presents for follow-up. He has been using Hydrofera Blue to the wound bed. He has no  issues or complaints today. Wound is again smaller. Electronic Signature(s) Signed: 03/01/2023 4:34:44 PM By: Geralyn Corwin DO Entered By: Geralyn Corwin on 03/01/2023 15:40:21 -------------------------------------------------------------------------------- Physical Exam Details Patient Name: Date of Service: Nathaniel [name], Nathaniel NA LD K. 03/01/2023 2:15 PM Medical Record Number: [id number] Patient Account Number: [id number] Date of Birth/Sex: Treating RN: [birth year] (37 y.o. M) Primary Care Provider: Avon Gully Other Clinician: Referring Provider: Treating Provider/Extender: Nathaniel [name], Nathaniel [name] Weeks in Treatment: 3 Constitutional respirations regular, non-labored and within target range for patient.. Cardiovascular 2+ dorsalis pedis/posterior tibialis pulses. Psychiatric pleasant and cooperative. Notes T the lateral aspect of the Over the right buttocks there is an open wound with granulation tissue, hyper granulated areas and slough. Circumferentially there is o epithelized tissue. No signs of infection including increased warmth, erythema or purulent drainage. Electronic Signature(s) Signed: 03/01/2023 4:34:44 PM By: Geralyn Corwin DO Entered By: Geralyn Corwin on 03/01/2023 15:42:29 -------------------------------------------------------------------------------- Physician Orders Details Patient Name: Date of Service: Nathaniel [name], Nathaniel NA LD K. 03/01/2023 2:15 PM Medical Record Number:  [id number] Patient Account Number: [id number] Date of Birth/Sex: Treating RN: 01-10-1986 (37 y.o. Nathaniel [name] Primary Care Provider: Avon Gully Other Clinician: Referring Provider: Treating Provider/Extender: Nathaniel [name] Weeks in Treatment: 3 Verbal / Phone Orders: No Nathaniel [name], Nathaniel [name] ([id number]) [id number] Page 3 of 8 Diagnosis Coding ICD-10 Coding Code Description L98.492 Non-pressure chronic ulcer of skin of other sites with fat layer exposed S31.819A Unspecified open wound of right buttock, initial encounter E10.622 Type 1 diabetes mellitus with other skin ulcer L02.91 Cutaneous abscess, unspecified F12.10 Cannabis abuse, uncomplicated Follow-up Appointments ppointment in 2 weeks. - Dr. Mikey [name] room 8 3pm 03/15/2023 Tuesday Return A Return appointment in 1 month. - Dr .Nathaniel [name] room 8 3pm 03/29/2023 Tuesday Anesthetic Wound #1 Right Gluteus (In clinic) Topical Lidocaine 5% applied to wound bed Bathing/ Shower/ Hygiene May shower and wash wound with soap and water. - apply clean dressing after washing Off-Loading Wound #1 Right Gluteus Turn and reposition every 2 hours Home Health Wound #1 Right Gluteus No change in wound care orders this week; continue Home Health for wound care. May utilize formulary equivalent dressing for wound treatment orders unless otherwise specified. - change dressing to hydrofera blue to be changed 3xwk; please ensure the hydrofera blue ready (if used) writing is side up. Dressing changes to be completed by Home Health on Monday / Wednesday / Friday except when patient has scheduled visit at [facility]. Other Home Health Orders/Instructions: - [facility] Wound Treatment Wound #1 - Gluteus Wound Laterality: Right Cleanser: Soap and Water 3 x Per Week/30 Days Discharge Instructions: May shower and wash wound with dial antibacterial soap and water prior to dressing change. Cleanser: Wound  Cleanser 3 x Per Week/30 Days Discharge Instructions: Cleanse the wound with wound cleanser prior to applying a clean dressing using gauze sponges, not tissue or cotton balls. Peri-Wound Care: Skin Prep 3 x Per Week/30 Days Discharge Instructions: Use skin prep as directed Peri-Wound Care: vaseline 3 x Per Week/30 Days Discharge Instructions: apply to periwound as needed. Prim Dressing: Hydrofera Blue Ready Transfer Foam, 4x5 (in/in) 3 x Per Week/30 Days ary Discharge Instructions: Apply to wound bed as instructed Secondary Dressing: Zetuvit Plus Silicone Border Sacrum Dressing, Sm, 7x7 (in/in) 3 x Per Week/30 Days Discharge Instructions: Apply silicone border over primary dressing as directed. Electronic Signature(s) Signed: 03/01/2023 4:34:44 PM By: Geralyn Corwin DO Previous Signature: 03/01/2023 3:34:20 PM Version By: Geralyn Corwin DO Entered By: Nathaniel [name],  [place]es [place] on 03/01/2023 15:42:42 -------------------------------------------------------------------------------- Problem List Details Patient Name: Date of Service: Nathaniel [name], Nathaniel NA LD K. 03/01/2023 2:15 PM Medical Record Number: [id number] Patient Account Number: [id number] Date of Birth/Sex: Treating RN: [birth year] (37 y.o. Nathaniel [name] Primary Care Provider: Avon Gully Other Clinician: Referring Provider: Treating Provider/Extender: Nathaniel [name] Weeks in Treatment: 3 EMRON, RECKNER ([id number]) [id number] Page 4 of 8 Active Problems ICD-10 Encounter Code Description Active Date MDM Diagnosis L98.492 Non-pressure chronic ulcer of skin of other sites with fat layer exposed 02/08/2023 No Yes S31.819A Unspecified open wound of right buttock, initial encounter 02/08/2023 No Yes E10.622 Type 1 diabetes mellitus with other skin ulcer 02/08/2023 No Yes L02.91 Cutaneous abscess, unspecified 02/08/2023 No Yes F12.10 Cannabis abuse, uncomplicated 02/08/2023 No Yes Inactive  Problems Resolved Problems Electronic Signature(s) Signed: 03/01/2023 4:34:44 PM By: Geralyn Corwin DO Previous Signature: 03/01/2023 3:34:20 PM Version By: Geralyn Corwin DO Entered By: Geralyn Corwin on 03/01/2023 15:39:50 -------------------------------------------------------------------------------- Progress Note Details Patient Name: Date of Service: Nathaniel [name], Nathaniel NA LD K. 03/01/2023 2:15 PM Medical Record Number: [id number] Patient Account Number: [id number] Date of Birth/Sex: Treating RN: 1985-12-25 (37 y.o. M) Primary Care Provider: Avon Gully Other Clinician: Referring Provider: Treating Provider/Extender: Nathaniel [name] Weeks in Treatment: 3 Subjective Chief Complaint Information obtained from Patient 02/08/2023; right buttocks wound History of Present Illness (HPI) 02/08/2023 [name] is a 37 year old male with a past medical history of uncontrolled type 1 diabetes on insulin, CVA and neurosyphilis that presents the clinic for a 5-month history of right buttocks wound secondary to necrotizing soft tissue infection. On 09/12/2022 he was admitted to the hospital for necrotizing soft tissue infection of the right buttocks and had OR debridement with a wound VAC and given IV antibiotics. His admission was complicated by diabetic ketoacidosis and required ICU care with intubation. He was subsequently discharged to a skilled nursing facility. Unfortunately he was readmitted to the hospital on 10/18/2022 with cardiac arrest and found to be in septic shock. This was thought to be due to hyperkalemia from DKA. At that time the wound was noted to be well-healing with no obvious signs of infection. For the past several months he has had home health changing the dressing 3 times weekly with silver alginate. He currently denies signs of infection. 5/30; patient presents for follow-up. He has been using Hydrofera Blue to the wound bed being changed 3  times weekly with home health. He has no issues or complaints today. Wound is smaller. 6/11; patient presents for follow-up. He has been using Hydrofera Blue to the wound bed. He has no issues or complaints today. Wound is again smaller. Patient History Information obtained from Patient. Family History Cancer - Maternal Grandparents, Diabetes - Father,Maternal Grandparents,Paternal Grandparents, Hypertension - Mother, Kidney Disease - Father. Social History Current every day smoker - 1/2 ppd, Marital Status - Single, Alcohol Use - Never, Drug Use - Current History - marijuana, Caffeine Use - Daily. Medical History ABDULAHI, Nathaniel [name] ([id number]) [id number] Page 5 of 8 Cardiovascular Patient has history of Hypertension Endocrine Patient has history of Type I Diabetes Denies history of Type II Diabetes Medical A Surgical History Notes nd Constitutional Symptoms (General Health) Stroke 2020 Eyes retinopathy left eye Cardiovascular cardiac arrest 09/2022 Genitourinary CKD stage III Neurologic CVA 2019 Objective Constitutional respirations regular, non-labored and within target range for patient.. Vitals Time Taken: 2:30 PM, Height: 71 in, Weight: 149 lbs, BMI: 20.8, Temperature: 98.9 F, Pulse: 93 bpm, Respiratory  Rate: 20 breaths/min, Blood Pressure: 108/68 mmHg, Capillary Blood Glucose: 130 mg/dl. Cardiovascular 2+ dorsalis pedis/posterior tibialis pulses. Psychiatric pleasant and cooperative. General Notes: T the lateral aspect of the Over the right buttocks there is an open wound with granulation tissue, hyper granulated areas and slough. o Circumferentially there is epithelized tissue. No signs of infection including increased warmth, erythema or purulent drainage. Integumentary (Hair, Skin) Wound #1 status is Open. Original cause of wound was Gradually Appeared. The date acquired was: 09/12/2022. The wound has been in treatment 3 weeks. The  wound is located on the Right Gluteus. The wound measures 6.5cm length x 5.9cm width x 0.1cm depth; 30.12cm^2 area and 3.012cm^3 volume. There is Fat Layer (Subcutaneous Tissue) exposed. There is no tunneling or undermining noted. There is a medium amount of serosanguineous drainage noted. The wound margin is distinct with the outline attached to the wound base. There is medium (34-66%) red, hyper - granulation within the wound bed. There is a medium (34-66%) amount of necrotic tissue within the wound bed including Adherent Slough. The periwound skin appearance exhibited: Scarring. The periwound skin appearance did not exhibit: Callus, Crepitus, Excoriation, Induration, Rash, Dry/Scaly, Maceration, Atrophie Blanche, Cyanosis, Ecchymosis, Hemosiderin Staining, Mottled, Pallor, Rubor, Erythema. Assessment Active Problems ICD-10 Non-pressure chronic ulcer of skin of other sites with fat layer exposed Unspecified open wound of right buttock, initial encounter Type 1 diabetes mellitus with other skin ulcer Cutaneous abscess, unspecified Cannabis abuse, uncomplicated Patient's wound has shown improvement in size and appearance since last clinic visit. I debrided nonviable tissue. A silver nitrate to the hypergranulating areas. I recommended continuing Hydrofera Blue. Follow-up in 2 weeks. Continue aggressive offloading. Procedures Wound #1 Pre-procedure diagnosis of Wound #1 is an Abscess located on the Right Gluteus . There was a Excisional Skin/Subcutaneous Tissue Debridement with a total area of 30.1 sq cm performed by Geralyn Corwin, DO. With the following instrument(s): Curette to remove Viable and Non-Viable tissue/material. Material removed includes Subcutaneous Tissue, Slough, Skin: Dermis, Skin: Epidermis, Biofilm, and Hyper-granulation after achieving pain control using Lidocaine 4% Topical Solution. A time out was conducted at 14:35, prior to the start of the procedure. A Minimum amount  of bleeding was controlled with Silver Nitrate. The procedure was tolerated well with a pain level of 0 throughout and a pain level of 0 following the procedure. Post Debridement Measurements: 6.5cm length x 5.9cm width x 0.1cm depth; 3.012cm^3 volume. Nathaniel [name], Nathaniel [name] ([id number]) [id number] Page 6 of 8 Character of Wound/Ulcer Post Debridement is improved. Post procedure Diagnosis Wound #1: Same as Pre-Procedure Plan Follow-up Appointments: Return Appointment in 2 weeks. - Dr. Mikey [name] room 8 3pm 03/15/2023 Tuesday Return appointment in 1 month. - Dr .Nathaniel [name] room 8 3pm 03/29/2023 Tuesday Anesthetic: Wound #1 Right Gluteus: (In clinic) Topical Lidocaine 5% applied to wound bed Bathing/ Shower/ Hygiene: May shower and wash wound with soap and water. - apply clean dressing after washing Off-Loading: Wound #1 Right Gluteus: Turn and reposition every 2 hours Home Health: Wound #1 Right Gluteus: No change in wound care orders this week; continue Home Health for wound care. May utilize formulary equivalent dressing for wound treatment orders unless otherwise specified. - change dressing to hydrofera blue to be changed 3xwk; please ensure the hydrofera blue ready (if used) writing is side up. Dressing changes to be completed by Home Health on Monday / Wednesday / Friday except when patient has scheduled visit at [name]. Other Home Health Orders/Instructions: - [facility] WOUND #1: - Gluteus Wound Laterality:  Right Cleanser: Soap and Water 3 x Per Week/30 Days Discharge Instructions: May shower and wash wound with dial antibacterial soap and water prior to dressing change. Cleanser: Wound Cleanser 3 x Per Week/30 Days Discharge Instructions: Cleanse the wound with wound cleanser prior to applying a clean dressing using gauze sponges, not tissue or cotton balls. Peri-Wound Care: Skin Prep 3 x Per Week/30 Days Discharge Instructions: Use skin prep as  directed Peri-Wound Care: vaseline 3 x Per Week/30 Days Discharge Instructions: apply to periwound as needed. Prim Dressing: Hydrofera Blue Ready Transfer Foam, 4x5 (in/in) 3 x Per Week/30 Days ary Discharge Instructions: Apply to wound bed as instructed Secondary Dressing: Zetuvit Plus Silicone Border Sacrum Dressing, Sm, 7x7 (in/in) 3 x Per Week/30 Days Discharge Instructions: Apply silicone border over primary dressing as directed. 1. In office sharp debridement 2. Silver nitrate 3. Hydrofera Blue 4. Aggressive offloading 5. Follow-up in 2 weeks Electronic Signature(s) Signed: 03/01/2023 4:34:44 PM By: Geralyn Corwin DO Entered By: Geralyn Corwin on 03/01/2023 15:54:27 -------------------------------------------------------------------------------- HxROS Details Patient Name: Date of Service: Nathaniel [name], Nathaniel NA LD K. 03/01/2023 2:15 PM Medical Record Number: [id number] Patient Account Number: [id number] Date of Birth/Sex: Treating RN: 09-Sep-1986 (37 y.o. M) Primary Care Provider: Avon Gully Other Clinician: Referring Provider: Treating Provider/Extender: Nathaniel [name] Weeks in Treatment: 3 Information Obtained From Patient Constitutional Symptoms (General Health) Medical History: Past Medical History Notes: Stroke 2020 Eyes Medical History: Past Medical History Notes: retinopathy left eye Cardiovascular Nathaniel [name], Nathaniel [name] ([id number]) [id number] Page 7 of 8 Medical History: Positive for: Hypertension Past Medical History Notes: cardiac arrest 09/2022 Endocrine Medical History: Positive for: Type I Diabetes Negative for: Type II Diabetes Treated with: Insulin Blood sugar tested every day: Yes Tested : Genitourinary Medical History: Past Medical History Notes: CKD stage III Neurologic Medical History: Past Medical History Notes: CVA 2019 Immunizations Pneumococcal Vaccine: Received Pneumococcal  Vaccination: No Implantable Devices None Family and Social History Cancer: Yes - Maternal Grandparents; Diabetes: Yes - Father,Maternal Grandparents,Paternal Grandparents; Hypertension: Yes - Mother; Kidney Disease: Yes - Father; Current every day smoker - 1/2 ppd; Marital Status - Single; Alcohol Use: Never; Drug Use: Current History - marijuana; Caffeine Use: Daily; Financial Concerns: No; Food, Clothing or Shelter Needs: No; Support System Lacking: No; Transportation Concerns: No Electronic Signature(s) Signed: 03/01/2023 4:34:44 PM By: Geralyn Corwin DO Entered By: Geralyn Corwin on 03/01/2023 15:40:28 -------------------------------------------------------------------------------- SuperBill Details Patient Name: Date of Service: Nathaniel [name], Nathaniel NA LD K. 03/01/2023 Medical Record Number: [id number] Patient Account Number: [id number] Date of Birth/Sex: Treating RN: [birth year] (37 y.o. Nathaniel [name] Primary Care Provider: Avon Gully Other Clinician: Referring Provider: Treating Provider/Extender: Nathaniel [name] Weeks in Treatment: 3 Diagnosis Coding ICD-10 Codes Code Description [phone] Non-pressure chronic ulcer of skin of other sites with fat layer exposed S31.819A Unspecified open wound of right buttock, initial encounter E10.622 Type 1 diabetes mellitus with other skin ulcer L02.91 Cutaneous abscess, unspecified F12.10 Cannabis abuse, uncomplicated Facility Procedures : [facility] CPT4 Code: [id number] ON, Binyomin K [id number] Description: 11042 - DEB SUBQ TISSUE 20 SQ CM/< ICD-10 Diagnosis Description L98.492 Non-pressure chronic ulcer of skin of other sites with fat layer exposed E10.622 Type 1 diabetes mellitus with other skin ulcer S31.819A Unspecified open wound of right  buttock, initial encounter 3929) [id number] 11045 - DEB SUBQ TISS EA ADDL 20CM ICD-10 Diagnosis Description L98.492 Non-pressure chronic ulcer of skin of other sites with  fat layer exposed E10.622 Type 1 diabetes mellitus with other skin  ulcer  S31.819A Unspecified open wound of right buttock, initial encounter Modifier: [id number] Quantity: 1 27.pdf Page 8 of 8 Physician Procedures : CPT4 Code Description Modifier [id number] 11042 - WC PHYS SUBQ TISS 20 SQ CM ICD-10 Diagnosis Description L98.492 Non-pressure chronic ulcer of skin of other sites with fat layer exposed E10.622 Type 1 diabetes mellitus with other skin ulcer S31.819A  Unspecified open wound of right buttock, initial encounter Quantity: 1 : [id number] 11045 - WC PHYS SUBQ TISS EA ADDL 20 CM ICD-10 Diagnosis Description L98.492 Non-pressure chronic ulcer of skin of other sites with fat layer exposed E10.622 Type 1 diabetes mellitus with other skin ulcer S31.819A Unspecified open wound of  right buttock, initial encounter Quantity: 1 Electronic Signature(s) Signed: 03/01/2023 4:34:44 PM By: Geralyn Corwin DO Previous Signature: 03/01/2023 3:34:20 PM Version By: Geralyn Corwin DO Entered By: Geralyn Corwin on 03/01/2023 15:54:50

## 2023-03-02 NOTE — Progress Notes (Signed)
JAHMAIR, CARRAHER ([id number]) [id number] Page 1 of 7 Visit Report for 03/01/2023 Arrival Information Details Patient Name: Date of Service: [place], [name] 03/01/2023 2:15 PM Medical Record Number: [id number] Patient Account Number: [id number] Date of Birth/Sex: Treating RN: 1986-07-31 (37 y.o. [name] Primary Care Melissa Pulido: Avon Gully Other Clinician: Referring Kamarian Sahakian: Treating Lalaine Overstreet/Extender: Leeroy Bock Weeks in Treatment: 3 Visit Information History Since Last Visit Added or deleted any medications: No Patient Arrived: [place] Mor Any new allergies or adverse reactions: No Arrival Time: 14:29 Had a fall or experienced change in No Accompanied By: mother activities of daily living that may affect Transfer Assistance: None risk of falls: Patient Identification Verified: Yes Signs or symptoms of abuse/neglect since last visito No Secondary Verification Process Completed: Yes Hospitalized since last visit: No Patient Requires Transmission-Based Precautions: No Implantable device outside of the clinic excluding No Patient Has Alerts: No cellular tissue based products placed in the center since last visit: Has Dressing in Place as Prescribed: Yes Pain Present Now: No Electronic Signature(s) Signed: 03/01/2023 6:01:11 PM By: Shawn Stall RN, BSN Entered By: Shawn Stall on 03/01/2023 14:30:38 -------------------------------------------------------------------------------- Encounter Discharge Information Details Patient Name: Date of Service: Nathaniel [name], Nathaniel NA LD K. 03/01/2023 2:15 PM Medical Record Number: [id number] Patient Account Number: [id number] Date of Birth/Sex: Treating RN: [birth year] (37 y.o. [name] Primary Care Metztli Sachdev: Avon Gully Other Clinician: Referring Maykayla Highley: Treating Samanthamarie Ezzell/Extender: Leeroy Bock Weeks in Treatment: 3 Encounter Discharge Information  Items Post Procedure Vitals Discharge Condition: Stable Temperature (F): 98.9 Ambulatory Status: Cane Pulse (bpm): 93 Discharge Destination: Home Respiratory Rate (breaths/min): 20 Transportation: Private Auto Blood Pressure (mmHg): 108/68 Accompanied By: mother Schedule Follow-up Appointment: Yes Clinical Summary of Care: Electronic Signature(s) Signed: 03/01/2023 6:01:11 PM By: Shawn Stall RN, BSN Entered By: Shawn Stall on 03/01/2023 14:47:00 -------------------------------------------------------------------------------- Lower Extremity Assessment Details Patient Name: Date of Service: Nathaniel [name], Nathaniel NA LD K. 03/01/2023 2:15 PM Medical Record Number: [id number] Patient Account Number: [id number] Date of Birth/Sex: Treating RN: 12-26-1985 (37 y.o. [name] Primary Care Lovett Coffin: Avon Gully Other Clinician: Referring Lalania Haseman: Treating Daemon Dowty/Extender: Leeroy Bock Weeks in Treatment: 3 Electronic Signature(s) Signed: 03/01/2023 6:01:11 PM By: Shawn Stall RN, BSN [place], Arther Abbott ([id number]) [id number] Page 2 of 7 Entered By: Shawn Stall on 03/01/2023 14:31:02 -------------------------------------------------------------------------------- Multi Wound Chart Details Patient Name: Date of Service: [facility], [name] 03/01/2023 2:15 PM Medical Record Number: [id number] Patient Account Number: [id number] Date of Birth/Sex: Treating RN: [birth year] (37 y.o. M) Primary Care Chigozie Basaldua: Avon Gully Other Clinician: Referring Graclyn Lawther: Treating Marylouise Mallet/Extender: Abbott Pao, [name] Weeks in Treatment: 3 Vital Signs Height(in): 71 Capillary Blood Glucose(mg/dl): 536 Weight(lbs): 644 Pulse(bpm): 93 Body Mass Index(BMI): 20.8 Blood Pressure(mmHg): 108/68 Temperature(F): 98.9 Respiratory Rate(breaths/min): 20 [1:Photos:] [N/A:N/A] Right Gluteus N/A N/A Wound Location: Gradually Appeared  N/A N/A Wounding Event: Abscess N/A N/A Primary Etiology: Hypertension, Type I Diabetes N/A N/A Comorbid History: 09/12/2022 N/A N/A Date Acquired: 3 N/A N/A Weeks of Treatment: Open N/A N/A Wound Status: No N/A N/A Wound Recurrence: 6.5x5.9x0.1 N/A N/A Measurements L x W x D (cm) 30.12 N/A N/A A (cm) : rea 3.012 N/A N/A Volume (cm) : 44.70% N/A N/A % Reduction in A rea: 44.70% N/A N/A % Reduction in Volume: Full Thickness Without Exposed N/A N/A Classification: Support Structures Medium N/A N/A Exudate A mount: Serosanguineous N/A N/A Exudate Type: red, brown N/A N/A Exudate Color: Distinct, outline attached  N/A N/A Wound Margin: Medium (34-66%) N/A N/A Granulation A mount: Red, Hyper-granulation N/A N/A Granulation Quality: Medium (34-66%) N/A N/A Necrotic A mount: Fat Layer (Subcutaneous Tissue): Yes N/A N/A Exposed Structures: Fascia: No Tendon: No Muscle: No Joint: No Bone: No Medium (34-66%) N/A N/A Epithelialization: Debridement - Excisional N/A N/A Debridement: Pre-procedure Verification/Time Out 14:35 N/A N/A Taken: Lidocaine 4% T opical Solution N/A N/A Pain Control: Subcutaneous, Slough N/A N/A Tissue Debrided: Skin/Subcutaneous Tissue N/A N/A Level: 30.1 N/A N/A Debridement A (sq cm): rea Curette N/A N/A Instrument: Minimum N/A N/A Bleeding: Silver Nitrate N/A N/A Hemostasis A chieved: 0 N/A N/A Procedural Pain: 0 N/A N/A Post Procedural Pain: Procedure was tolerated well N/A N/A Debridement Treatment Response: 6.5x5.9x0.1 N/A N/A Post Debridement Measurements L x W x D (cm) 3.012 N/A N/A Post Debridement Volume: (cm) Scarring: Yes N/A N/A Periwound Skin Texture: Excoriation: No Induration: No Callus: No Nathaniel [name], Nathaniel [name] ([id number]) [id number].pdf Page 3 of 7 Crepitus: No Rash: No Maceration: No N/A N/A Periwound Skin Moisture: Dry/Scaly: No Atrophie Blanche: No N/A N/A Periwound Skin  Color: Cyanosis: No Ecchymosis: No Erythema: No Hemosiderin Staining: No Mottled: No Pallor: No Rubor: No Debridement N/A N/A Procedures Performed: Treatment Notes Wound #1 (Gluteus) Wound Laterality: Right Cleanser Soap and Water Discharge Instruction: May shower and wash wound with dial antibacterial soap and water prior to dressing change. Wound Cleanser Discharge Instruction: Cleanse the wound with wound cleanser prior to applying a clean dressing using gauze sponges, not tissue or cotton balls. Peri-Wound Care Skin Prep Discharge Instruction: Use skin prep as directed vaseline Discharge Instruction: apply to periwound as needed. Topical Primary Dressing Hydrofera Blue Ready Transfer Foam, 4x5 (in/in) Discharge Instruction: Apply to wound bed as instructed Secondary Dressing Zetuvit Plus Silicone Border Sacrum Dressing, Sm, 7x7 (in/in) Discharge Instruction: Apply silicone border over primary dressing as directed. Secured With Compression Wrap Compression Stockings Facilities manager) Signed: 03/01/2023 4:34:44 PM By: Geralyn Corwin DO Entered By: Geralyn Corwin on 03/01/2023 15:39:55 -------------------------------------------------------------------------------- Multi-Disciplinary Care Plan Details Patient Name: Date of Service: Nathaniel [name], Nathaniel NA LD K. 03/01/2023 2:15 PM Medical Record Number: [id number] Patient Account Number: [id number] Date of Birth/Sex: Treating RN: 1986-04-14 (37 y.o. [name] Primary Care Liridona Mashaw: Avon Gully Other Clinician: Referring Jenina Moening: Treating Taegen Lennox/Extender: Leeroy Bock Weeks in Treatment: 3 Active Inactive Nutrition Nursing Diagnoses: Impaired glucose control: actual or potential Goals: Patient/caregiver verbalizes understanding of need to maintain therapeutic glucose control per primary care physician Date Initiated: 02/08/2023 Target Resolution Date:  03/08/2023 Nathaniel [name], Nathaniel [name] ([id number]) [phone].pdf Page 4 of 7 Goal Status: Active Patient/caregiver will maintain therapeutic glucose control Date Initiated: 02/08/2023 Target Resolution Date: 03/18/2023 Goal Status: Active Interventions: Assess HgA1c results as ordered upon admission and as needed Assess patient nutrition upon admission and as needed per policy Provide education on elevated blood sugars and impact on wound healing Notes: Wound/Skin Impairment Nursing Diagnoses: Impaired tissue integrity Knowledge deficit related to smoking impact on wound healing Knowledge deficit related to ulceration/compromised skin integrity Goals: Patient/caregiver will verbalize understanding of skin care regimen Date Initiated: 02/08/2023 Target Resolution Date: 03/22/2023 Goal Status: Active Ulcer/skin breakdown will have a volume reduction of 30% by week 4 Date Initiated: 02/08/2023 Target Resolution Date: 03/08/2023 Goal Status: Active Interventions: Assess patient/caregiver ability to obtain necessary supplies Assess patient/caregiver ability to perform ulcer/skin care regimen upon admission and as needed Assess ulceration(s) every visit Provide education on smoking Provide education on ulcer and skin care Notes: Electronic Signature(s) Signed: 03/01/2023  6:01:11 PM By: Shawn Stall RN, BSN Entered By: Shawn Stall on 03/01/2023 14:45:59 -------------------------------------------------------------------------------- Pain Assessment Details Patient Name: Date of Service: Nathaniel [name], Nathaniel NA LD K. 03/01/2023 2:15 PM Medical Record Number: [id number] Patient Account Number: [id number] Date of Birth/Sex: Treating RN: 1985/09/28 (37 y.o. [name] Primary Care Caton Popowski: Avon Gully Other Clinician: Referring Sandia Pfund: Treating Fontaine Hehl/Extender: Leeroy Bock Weeks in Treatment: 3 Active Problems Location of Pain Severity and  Description of Pain Patient Has Paino No Site Locations Nathaniel [name], Nathaniel [name] ([id number]) [id number] Page 5 of 7 Pain Management and Medication Current Pain Management: Electronic Signature(s) Signed: 03/01/2023 6:01:11 PM By: Shawn Stall RN, BSN Entered By: Shawn Stall on 03/01/2023 14:30:58 -------------------------------------------------------------------------------- Patient/Caregiver Education Details Patient Name: Date of Service: Nathaniel [name], Nathaniel NA LD K. 6/11/2024andnbsp2:15 PM Medical Record Number: [id number] Patient Account Number: [id number] Date of Birth/Gender: Treating RN: Oct 23, 1985 (37 y.o. [name] Primary Care Physician: Avon Gully Other Clinician: Referring Physician: Treating Physician/Extender: Leeroy Bock Weeks in Treatment: 3 Education Assessment Education Provided To: Patient Education Topics Provided Wound/Skin Impairment: Handouts: Caring for Your Ulcer Methods: Explain/Verbal Responses: Reinforcements needed Electronic Signature(s) Signed: 03/01/2023 6:01:11 PM By: Shawn Stall RN, BSN Entered By: Shawn Stall on 03/01/2023 14:46:10 -------------------------------------------------------------------------------- Wound Assessment Details Patient Name: Date of Service: Nathaniel [name], Nathaniel NA LD K. 03/01/2023 2:15 PM Medical Record Number: [id number] Patient Account Number: [id number] Date of Birth/Sex: Treating RN: [birth year] (37 y.o. [name] Primary Care Kaeleen Odom: [place]on Gully Other Clinician: Referring Sheridan Gettel: Treating Vestal Crandall/Extender: Leeroy Bock Weeks in Treatment: 3 Wound Status Wound Number: 1 Primary Etiology: Abscess Wound Location: Right Gluteus Wound Status: Open Wounding Event: Gradually Appeared Comorbid History: Hypertension, Type I Diabetes Date Acquired: 09/12/2022 Weeks Of Treatment: 3 Clustered Wound: No Photos Wound  Measurements Nathaniel [name], Nathaniel [name] ([id number]) Length: (cm) 6.5 Width: (cm) 5.9 Depth: (cm) 0.1 Area: (cm) 30.12 Volume: (cm) 3.012 [id number] Page 6 of 7 % Reduction in Area: 44.7% % Reduction in Volume: 44.7% Epithelialization: Medium (34-66%) Tunneling: No Undermining: No Wound Description Classification: Full Thickness Without Exposed Support Structures Wound Margin: Distinct, outline attached Exudate Amount: Medium Exudate Type: Serosanguineous Exudate Color: red, brown Foul Odor After Cleansing: No Slough/Fibrino Yes Wound Bed Granulation Amount: Medium (34-66%) Exposed Structure Granulation Quality: Red, Hyper-granulation Fascia Exposed: No Necrotic Amount: Medium (34-66%) Fat Layer (Subcutaneous Tissue) Exposed: Yes Necrotic Quality: Adherent Slough Tendon Exposed: No Muscle Exposed: No Joint Exposed: No Bone Exposed: No Periwound Skin Texture Texture Color No Abnormalities Noted: No No Abnormalities Noted: No Callus: No Atrophie Blanche: No Crepitus: No Cyanosis: No Excoriation: No Ecchymosis: No Induration: No Erythema: No Rash: No Hemosiderin Staining: No Scarring: Yes Mottled: No Pallor: No Moisture Rubor: No No Abnormalities Noted: No Dry / Scaly: No Maceration: No Treatment Notes Wound #1 (Gluteus) Wound Laterality: Right Cleanser Soap and Water Discharge Instruction: May shower and wash wound with dial antibacterial soap and water prior to dressing change. Wound Cleanser Discharge Instruction: Cleanse the wound with wound cleanser prior to applying a clean dressing using gauze sponges, not tissue or cotton balls. Peri-Wound Care Skin Prep Discharge Instruction: Use skin prep as directed vaseline Discharge Instruction: apply to periwound as needed. Topical Primary Dressing Hydrofera Blue Ready Transfer Foam, 4x5 (in/in) Discharge Instruction: Apply to wound bed as instructed Secondary Dressing Zetuvit Plus  Silicone Border Sacrum Dressing, Sm, 7x7 (in/in) Discharge Instruction: Apply silicone border over primary dressing as directed. Secured With Compression Wrap Compression Stockings Add-Ons Electronic  Signature(s) Signed: 03/01/2023 6:01:11 PM By: Shawn Stall RN, BSN Entered By: Shawn Stall on 03/01/2023 14:35:53 Nathaniel [name] ([id number]) [id number] Page 7 of 7 -------------------------------------------------------------------------------- Vitals Details Patient Name: Date of Service: [facility], [name] 03/01/2023 2:15 PM Medical Record Number: [id number] Patient Account Number: [id number] Date of Birth/Sex: Treating RN: [birth year] (37 y.o. [name] Primary Care Tanina Barb: Avon Gully Other Clinician: Referring Precious Gilchrest: Treating Kristoph Sattler/Extender: Leeroy Bock Weeks in Treatment: 3 Vital Signs Time Taken: 14:30 Temperature (F): 98.9 Height (in): 71 Pulse (bpm): 93 Weight (lbs): 149 Respiratory Rate (breaths/min): 20 Body Mass Index (BMI): 20.8 Blood Pressure (mmHg): 108/68 Capillary Blood Glucose (mg/dl): 010 Reference Range: 80 - 120 mg / dl Electronic Signature(s) Signed: 03/01/2023 6:01:11 PM By: Shawn Stall RN, BSN Entered By: Shawn Stall on 03/01/2023 14:30:54

## 2023-03-15 ENCOUNTER — Encounter (HOSPITAL_BASED_OUTPATIENT_CLINIC_OR_DEPARTMENT_OTHER): Payer: Medicare HMO | Admitting: Internal Medicine

## 2023-03-15 DIAGNOSIS — E10622 Type 1 diabetes mellitus with other skin ulcer: Secondary | ICD-10-CM

## 2023-03-15 DIAGNOSIS — S31819A Unspecified open wound of right buttock, initial encounter: Secondary | ICD-10-CM

## 2023-03-15 DIAGNOSIS — L98492 Non-pressure chronic ulcer of skin of other sites with fat layer exposed: Secondary | ICD-10-CM | POA: Diagnosis not present

## 2023-03-15 NOTE — Progress Notes (Signed)
Nathaniel, [name] ([id number]) [id number] Page 1 of 7 Visit Report for 03/15/2023 Arrival Information Details Patient Name: Date of Service: Nathaniel [name], Nathaniel NA LD [name]. 03/15/2023 3:00 PM Medical Record Number: [id number] Patient Account Number: [id number] Date of Birth/Sex: Treating RN: 08/11/1986 (37 y.o. Nathaniel [name] Primary Care Hawthorne Day: Nathaniel [name] Other Clinician: Referring Nathaniel [name]: Treating Nathaniel [name]/Extender: Nathaniel [name] Weeks in Treatment: 5 Visit Information History Since Last Visit Added or deleted any medications: No Patient Arrived: Ambulatory Any new allergies or adverse reactions: No Arrival Time: 15:13 Had a fall or experienced change in No Accompanied By: friend activities of daily living that Nathaniel [name] affect Transfer Assistance: None risk of falls: Patient Identification Verified: Yes Signs or symptoms of abuse/neglect since last visito No Patient Requires Transmission-Based Precautions: No Hospitalized since last visit: No Patient Has Alerts: No Implantable device outside of the clinic excluding No cellular tissue based products placed in the center since last visit: Has Dressing in Place as Prescribed: Yes Pain Present Now: Yes Electronic Signature(s) Signed: 03/15/2023 5:13:20 PM By: Nathaniel Schwalbe RN Entered By: Nathaniel [name] on 03/15/2023 15:13:51 -------------------------------------------------------------------------------- Encounter Discharge Information Details Patient Name: Date of Service: Nathaniel [name], [name] 03/15/2023 3:00 PM Medical Record Number: [id number] Patient Account Number: [id number] Date of Birth/Sex: Treating RN: 10-23-1985 (37 y.o. Nathaniel [name] Primary Care Nathaniel [name]: Nathaniel [name] Other Clinician: Referring Maylea Soria: Treating Nathaniel [name]/Extender: Nathaniel [name] Weeks in Treatment: 5 Encounter Discharge Information Items Discharge Condition:  Stable Ambulatory Status: Cane Discharge Destination: Home Transportation: Private Auto Accompanied By: mother Schedule Follow-up Appointment: Yes Clinical Summary of Care: Patient Declined Electronic Signature(s) Signed: 03/15/2023 5:13:20 PM By: Nathaniel Schwalbe RN Entered By: Nathaniel [name] on 03/15/2023 16:53:39 Nathaniel [name] ([id number]) [id number] Page 2 of 7 -------------------------------------------------------------------------------- Lower Extremity Assessment Details Patient Name: Date of Service: Nathaniel [name], Nathaniel NA LD [name]. 03/15/2023 3:00 PM Medical Record Number: [id number] Patient Account Number: [id number] Date of Birth/Sex: Treating RN: Sep 05, 1986 (37 y.o. Nathaniel [name] Primary Care Adeleine Pask: Nathaniel [name] Other Clinician: Referring Tod Abrahamsen: Treating Arlind Klingerman/Extender: Nathaniel [name] Weeks in Treatment: 5 Electronic Signature(s) Signed: 03/15/2023 5:13:20 PM By: Nathaniel Schwalbe RN Entered By: Nathaniel [name] on 03/15/2023 15:19:37 -------------------------------------------------------------------------------- Multi Wound Chart Details Patient Name: Date of Service: Nathaniel [name], [name] 03/15/2023 3:00 PM Medical Record Number: [id number] Patient Account Number: [id number] Date of Birth/Sex: Treating RN: [name] (37 y.o. M) Primary Care Nathaniel [name]: Nathaniel [name] Other Clinician: Referring Nathaniel [name]: Treating Kealy Lewter/Extender: [name], Nathaniel Weeks in Treatment: 5 Vital Signs Height(in): 71 Pulse(bpm): 91 Weight(lbs): 149 Blood Pressure(mmHg): 154/91 Body Mass Index(BMI): 20.8 Temperature(F): 98.5 Respiratory Rate(breaths/min): 18 [1:Photos:] [N/A:N/A] Right Gluteus N/A N/A Wound Location: Gradually Appeared N/A N/A Wounding Event: Abscess N/A N/A Primary Etiology: Hypertension, Type I Diabetes N/A N/A Comorbid History: 09/12/2022 N/A N/A Date Acquired: 5 N/A N/A Weeks of  Treatment: Open N/A N/A Wound Status: No N/A N/A Wound Recurrence: 6.3x5.8x0.1 N/A N/A Measurements L x W x D (cm) 28.698 N/A N/A A (cm) : rea 2.87 N/A N/A Volume (cm) : 47.30% N/A N/A % Reduction in A rea: 47.30% N/A N/A % Reduction in Volume: Full Thickness Without Exposed N/A N/A Classification: Support Structures Medium N/A N/A Exudate Amount: Serosanguineous N/A N/A Exudate Type: red, brown N/A N/A Exudate Color: Distinct, outline attached N/A N/A Wound Margin: Medium (34-66%) N/A N/A Granulation Amount: Red, Pink N/A N/A Granulation Quality: Medium (34-66%) N/A N/A Necrotic Amount: Nathaniel [name], Nathaniel [name] ([id number]) [id number] Page  3 of 7 Fat Layer (Subcutaneous Tissue): Yes N/A N/A Exposed Structures: Fascia: No Tendon: No Muscle: No Joint: No Bone: No Medium (34-66%) N/A N/A Epithelialization: Scarring: Yes N/A N/A Periwound Skin Texture: Excoriation: No Induration: No Callus: No Crepitus: No Rash: No Maceration: No N/A N/A Periwound Skin Moisture: Dry/Scaly: No Atrophie Blanche: No N/A N/A Periwound Skin Color: Cyanosis: No Ecchymosis: No Erythema: No Hemosiderin Staining: No Mottled: No Pallor: No Rubor: No No Abnormality N/A N/A Temperature: Chemical Cauterization N/A N/A Procedures Performed: Treatment Notes Electronic Signature(s) Signed: 03/15/2023 4:25:00 PM By: Nathaniel Corwin DO Entered By: Nathaniel [name] on 03/15/2023 15:52:34 -------------------------------------------------------------------------------- Multi-Disciplinary Care Plan Details Patient Name: Date of Service: Nathaniel [name], [name] 03/15/2023 3:00 PM Medical Record Number: [id number] Patient Account Number: [id number] Date of Birth/Sex: Treating RN: Sep 25, 1985 (37 y.o. Nathaniel [name] Primary Care Aspyn [name]: Nathaniel [name] Other Clinician: Referring Kyona Chauncey: Treating Zorana Brockwell/Extender: Nathaniel [name] Weeks in  Treatment: 5 Active Inactive Nutrition Nursing Diagnoses: Impaired glucose control: actual or potential Goals: Patient/caregiver verbalizes understanding of need to maintain therapeutic glucose control per primary care physician Date Initiated: 02/08/2023 Target Resolution Date: 06/18/2023 Goal Status: Active Patient/caregiver will maintain therapeutic glucose control Date Initiated: 02/08/2023 Target Resolution Date: 06/18/2023 Goal Status: Active Interventions: Assess HgA1c results as ordered upon admission and as needed Assess patient nutrition upon admission and as needed per policy Provide education on elevated blood sugars and impact on wound healing Notes: Wound/Skin Impairment Nursing Diagnoses: Impaired tissue integrity Knowledge deficit related to smoking impact on wound healing Nathaniel [name], Nathaniel [name] ([id number]) [phone].pdf Page 4 of 7 Knowledge deficit related to ulceration/compromised skin integrity Goals: Patient/caregiver will verbalize understanding of skin care regimen Date Initiated: 02/08/2023 Target Resolution Date: 06/18/2023 Goal Status: Active Ulcer/skin breakdown will have a volume reduction of 30% by week 4 Date Initiated: 02/08/2023 Target Resolution Date: 06/18/2023 Goal Status: Active Interventions: Assess patient/caregiver ability to obtain necessary supplies Assess patient/caregiver ability to perform ulcer/skin care regimen upon admission and as needed Assess ulceration(s) every visit Provide education on smoking Provide education on ulcer and skin care Notes: Electronic Signature(s) Signed: 03/15/2023 5:13:20 PM By: Nathaniel Schwalbe RN Entered By: Nathaniel [name] on 03/15/2023 16:50:05 -------------------------------------------------------------------------------- Pain Assessment Details Patient Name: Date of Service: Nathaniel [name], [name] 03/15/2023 3:00 PM Medical Record Number: [id number] Patient Account Number:  [id number] Date of Birth/Sex: Treating RN: Jul 27, 1986 (37 y.o. Nathaniel [name] Primary Care Jacklyn Branan: Nathaniel [name] Other Clinician: Referring Nathaniel [name]: Treating Nathaniel [name]/Extender: Nathaniel [name] Weeks in Treatment: 5 Active Problems Location of Pain Severity and Description of Pain Patient Has Paino No Site Locations Pain Management and Medication Current Pain Management: Electronic Signature(s) Signed: 03/15/2023 5:13:20 PM By: Nathaniel Schwalbe RN Entered By: Nathaniel [name] on 03/15/2023 15:18:30 Nathaniel [name] ([id number]) [id number] Page 5 of 7 -------------------------------------------------------------------------------- Patient/Caregiver Education Details Patient Name: Date of Service: Nathaniel [name] NA LD [name]. 6/25/2024andnbsp3:00 PM Medical Record Number: [id number] Patient Account Number: [id number] Date of Birth/Gender: Treating RN: 1985-11-29 (37 y.o. Nathaniel [name] Primary Care Physician: Nathaniel [name] Other Clinician: Referring Physician: Treating Physician/Extender: Nathaniel [name] Weeks in Treatment: 5 Education Assessment Education Provided To: Patient Education Topics Provided Wound/Skin Impairment: Methods: Explain/Verbal Responses: Return demonstration correctly Electronic Signature(s) Signed: 03/15/2023 5:13:20 PM By: Nathaniel Schwalbe RN Entered By: Nathaniel [name] on 03/15/2023 16:51:05 -------------------------------------------------------------------------------- Wound Assessment Details Patient Name: Date of Service: Nathaniel [name], [name] 03/15/2023 3:00 PM Medical Record Number: [id number] Patient Account Number:  [id number] Date of Birth/Sex: Treating RN: 07-Oct-1985 (37 y.o. Nathaniel [name] Primary Care Karmel Patricelli: Nathaniel [name] Other Clinician: Referring Nathaniel [name]: Treating Nathaniel [name]/Extender: Nathaniel [name] Weeks in Treatment: 5 Wound Status Wound  Number: 1 Primary Etiology: Abscess Wound Location: Right Gluteus Wound Status: Open Wounding Event: Gradually Appeared Comorbid History: Hypertension, Type I Diabetes Date Acquired: 09/12/2022 Weeks Of Treatment: 5 Clustered Wound: No Photos Wound Measurements Length: (cm) 6.3 Width: (cm) 5.8 Nathaniel [name], Nathaniel [name] ([id number]) Depth: (cm) 0.1 Area: (cm) 28.6 Volume: (cm) 2.87 % Reduction in Area: 47.3% % Reduction in Volume: 47.3% [id number] Page 6 of 7 Epithelialization: Medium (34-66%) 98 Tunneling: No Undermining: No Wound Description Classification: Full Thickness Without Exposed Support Structures Wound Margin: Distinct, outline attached Exudate Amount: Medium Exudate Type: Serosanguineous Exudate Color: red, brown Foul Odor After Cleansing: No Slough/Fibrino Yes Wound Bed Granulation Amount: Medium (34-66%) Exposed Structure Granulation Quality: Red, Pink Fascia Exposed: No Necrotic Amount: Medium (34-66%) Fat Layer (Subcutaneous Tissue) Exposed: Yes Necrotic Quality: Adherent Slough Tendon Exposed: No Muscle Exposed: No Joint Exposed: No Bone Exposed: No Periwound Skin Texture Texture Color No Abnormalities Noted: No No Abnormalities Noted: Yes Callus: No Temperature / Pain Crepitus: No Temperature: No Abnormality Excoriation: No Induration: No Rash: No Scarring: Yes Moisture No Abnormalities Noted: No Dry / Scaly: No Maceration: No Treatment Notes Wound #1 (Gluteus) Wound Laterality: Right Cleanser Soap and Water Discharge Instruction: Nathaniel [name] shower and wash wound with dial antibacterial soap and water prior to dressing change. Wound Cleanser Discharge Instruction: Cleanse the wound with wound cleanser prior to applying a clean dressing using gauze sponges, not tissue or cotton balls. Peri-Wound Care Skin Prep Discharge Instruction: Use skin prep as directed vaseline Discharge Instruction: apply to periwound as  needed. Topical Primary Dressing Hydrofera Blue Ready Transfer Foam, 4x5 (in/in) Discharge Instruction: Apply to wound bed as instructed Secondary Dressing Zetuvit Plus Silicone Border Sacrum Dressing, Sm, 7x7 (in/in) Discharge Instruction: Apply silicone border over primary dressing as directed. Secured With Compression Wrap Compression Stockings Facilities manager) Signed: 03/15/2023 5:13:20 PM By: Nathaniel Schwalbe RN Entered By: Nathaniel [name] on 03/15/2023 15:29:00 Nathaniel [name] ([id number]) [id number] Page 7 of 7 -------------------------------------------------------------------------------- Vitals Details Patient Name: Date of Service: Nathaniel [name], Nathaniel NA LD [name]. 03/15/2023 3:00 PM Medical Record Number: [id number] Patient Account Number: [id number] Date of Birth/Sex: Treating RN: Feb 18, 1986 (37 y.o. Nathaniel [name] Primary Care [name]: Nathaniel [name] Other Clinician: Referring Nathaniel [name]: Treating Nathaniel [name]/Extender: Nathaniel [name] Weeks in Treatment: 5 Vital Signs Time Taken: 15:13 Temperature (F): 98.5 Height (in): 71 Pulse (bpm): 91 Weight (lbs): 149 Respiratory Rate (breaths/min): 18 Body Mass Index (BMI): 20.8 Blood Pressure (mmHg): 154/91 Reference Range: 80 - 120 mg / dl Electronic Signature(s) Signed: 03/15/2023 5:13:20 PM By: Nathaniel Schwalbe RN Entered By: Nathaniel [name] on 03/15/2023 15:18:17

## 2023-03-16 NOTE — Progress Notes (Signed)
ELSTON, ALDAPE ([id number]) [id number] Page 1 of 8 Visit Report for 03/15/2023 Chief Complaint Document Details Patient Name: Date of Service: Nathaniel [name], [name] 03/15/2023 3:00 PM Medical Record Number: [id number] Patient Account Number: [id number] Date of Birth/Sex: Treating RN: 14-Feb-1986 (37 y.o. M) Primary Care Provider: Avon Gully Other Clinician: Referring Provider: Treating Provider/Extender: Abbott Pao, [name] Weeks in Treatment: 5 Information Obtained from: Patient Chief Complaint 02/08/2023; right buttocks wound Electronic Signature(s) Signed: 03/15/2023 4:25:00 PM By: Geralyn Corwin DO Entered By: Geralyn Corwin on 03/15/2023 15:52:40 -------------------------------------------------------------------------------- HPI Details Patient Name: Date of Service: Nathaniel [name], RO NA LD K. 03/15/2023 3:00 PM Medical Record Number: [id number] Patient Account Number: [id number] Date of Birth/Sex: Treating RN: Jul 10, 1986 (37 y.o. M) Primary Care Provider: Avon Gully Other Clinician: Referring Provider: Treating Provider/Extender: Leeroy Bock Weeks in Treatment: 5 History of Present Illness HPI Description: 02/08/2023 [name] is a 37 year old male with a past medical history of uncontrolled type 1 diabetes on insulin, CVA and neurosyphilis that presents the clinic for a 25-month history of right buttocks wound secondary to necrotizing soft tissue infection. On 09/12/2022 he was admitted to the hospital for necrotizing soft tissue infection of the right buttocks and had OR debridement with a wound VAC and given IV antibiotics. His admission was complicated by diabetic ketoacidosis and required ICU care with intubation. He was subsequently discharged to a skilled nursing facility. Unfortunately he was readmitted to the hospital on 10/18/2022 with cardiac arrest and found to be in septic shock.  This was thought to be due to hyperkalemia from DKA. At that time the wound was noted to be well-healing with no obvious signs of infection. For the past several months he has had home health changing the dressing 3 times weekly with silver alginate. He currently denies signs of infection. 5/30; patient presents for follow-up. He has been using Hydrofera Blue to the wound bed being changed 3 times weekly with home health. He has no issues or complaints today. Wound is smaller. 6/11; patient presents for follow-up. He has been using Hydrofera Blue to the wound bed. He has no issues or complaints today. Wound is again smaller. 6/25; patient presents for follow-up. He has been using Hydrofera Blue to the wound bed. Wound is smaller. Electronic Signature(s) Signed: 03/15/2023 4:25:00 PM By: Geralyn Corwin DO Entered By: Geralyn Corwin on 03/15/2023 15:52:56 Sherrill Raring ([id number]) [id number] Page 2 of 8 -------------------------------------------------------------------------------- Chemical Cauterization Details Patient Name: Date of Service: Nathaniel [name], [name] 03/15/2023 3:00 PM Medical Record Number: [id number] Patient Account Number: [id number] Date of Birth/Sex: Treating RN: [birth year] (37 y.o. Dianna Limbo Primary Care Provider: Avon Gully Other Clinician: Referring Provider: Treating Provider/Extender: Leeroy Bock Weeks in Treatment: 5 Procedure Performed for: Wound #1 Right Gluteus Performed By: Physician Geralyn Corwin, DO Post Procedure Diagnosis Same as Pre-procedure Electronic Signature(s) Signed: 03/15/2023 4:25:00 PM By: Geralyn Corwin DO Signed: 03/15/2023 5:13:20 PM By: Karie Schwalbe RN Entered By: Karie Schwalbe on 03/15/2023 15:46:44 -------------------------------------------------------------------------------- Physical Exam Details Patient Name: Date of Service: Nathaniel [name], RO NA LD K.  03/15/2023 3:00 PM Medical Record Number: [id number] Patient Account Number: [id number] Date of Birth/Sex: Treating RN: 1986-09-20 (37 y.o. M) Primary Care Provider: Avon Gully Other Clinician: Referring Provider: Treating Provider/Extender: Abbott Pao, [name] Weeks in Treatment: 5 Constitutional respirations regular, non-labored and within target range for patient.Marland Kitchen Psychiatric pleasant and cooperative. Notes T the lateral aspect over the right  buttocks there is an open wound with granulation tissue and hyper granulated areas. Circumferentially there is epithelized o tissue. No signs of infection including increased warmth, erythema or purulent drainage. Electronic Signature(s) Signed: 03/15/2023 4:25:00 PM By: Geralyn Corwin DO Entered By: Geralyn Corwin on 03/15/2023 15:54:13 -------------------------------------------------------------------------------- Physician Orders Details Patient Name: Date of Service: Nathaniel [name], RO NA LD K. 03/15/2023 3:00 PM Medical Record Number: [id number] Patient Account Number: [id number] Date of Birth/Sex: Treating RN: Jun 10, 1986 (37 y.o. Dianna Limbo Primary Care Provider: Avon Gully Other Clinician: Referring Provider: Treating Provider/Extender: Leeroy Bock Weeks in Treatment: 5 Nathaniel [name], Nathaniel [name] ([id number]) [id number] Page 3 of 8 Verbal / Phone Orders: No Diagnosis Coding Follow-up Appointments ppointment in 2 weeks. - Dr. Mikey Bussing room 8 03/29/23 at 2:30PM Return A Anesthetic Wound #1 Right Gluteus (In clinic) Topical Lidocaine 5% applied to wound bed Bathing/ Shower/ Hygiene May shower and wash wound with soap and water. - apply clean dressing after washing Off-Loading Wound #1 Right Gluteus Turn and reposition every 2 hours Home Health Wound #1 Right Gluteus No change in wound care orders this week; continue Home Health for wound care. May utilize  formulary equivalent dressing for wound treatment orders unless otherwise specified. - change dressing to hydrofera blue to be changed 3xwk; please ensure the hydrofera blue ready (if used) writing is side up. Dressing changes to be completed by Home Health on Monday / Wednesday / Friday except when patient has scheduled visit at [facility]. Other Home Health Orders/Instructions: - [facility] Wound Treatment Wound #1 - Gluteus Wound Laterality: Right Cleanser: Soap and Water 3 x Per Week/30 Days Discharge Instructions: May shower and wash wound with dial antibacterial soap and water prior to dressing change. Cleanser: Wound Cleanser 3 x Per Week/30 Days Discharge Instructions: Cleanse the wound with wound cleanser prior to applying a clean dressing using gauze sponges, not tissue or cotton balls. Peri-Wound Care: Skin Prep 3 x Per Week/30 Days Discharge Instructions: Use skin prep as directed Peri-Wound Care: vaseline 3 x Per Week/30 Days Discharge Instructions: apply to periwound as needed. Prim Dressing: Hydrofera Blue Ready Transfer Foam, 4x5 (in/in) 3 x Per Week/30 Days ary Discharge Instructions: Apply to wound bed as instructed Secondary Dressing: Zetuvit Plus Silicone Border Sacrum Dressing, Sm, 7x7 (in/in) 3 x Per Week/30 Days Discharge Instructions: Apply silicone border over primary dressing as directed. Electronic Signature(s) Signed: 03/15/2023 4:25:00 PM By: Geralyn Corwin DO Entered By: Geralyn Corwin on 03/15/2023 15:55:09 -------------------------------------------------------------------------------- Problem List Details Patient Name: Date of Service: Nathaniel [name], RO NA LD K. 03/15/2023 3:00 PM Medical Record Number: [id number] Patient Account Number: [id number] Date of Birth/Sex: Treating RN: 1986/03/08 (37 y.o. M) Primary Care Provider: Avon Gully Other Clinician: Referring Provider: Treating Provider/Extender: Leeroy Bock Weeks in  Treatment: 5 Active Problems ICD-10 Encounter Code Description Active Date MDM KHALIN, ROYCE ([id number]) [id number] Page 4 of 8 Code Description Active Date MDM Diagnosis L98.492 Non-pressure chronic ulcer of skin of other sites with fat layer exposed 02/08/2023 No Yes S31.819A Unspecified open wound of right buttock, initial encounter 02/08/2023 No Yes E10.622 Type 1 diabetes mellitus with other skin ulcer 02/08/2023 No Yes L02.91 Cutaneous abscess, unspecified 02/08/2023 No Yes F12.10 Cannabis abuse, uncomplicated 02/08/2023 No Yes Inactive Problems Resolved Problems Electronic Signature(s) Signed: 03/15/2023 4:25:00 PM By: Geralyn Corwin DO Entered By: Geralyn Corwin on 03/15/2023 15:52:28 -------------------------------------------------------------------------------- Progress Note Details Patient Name: Date of Service: Nathaniel [name], RO NA LD K. 03/15/2023 3:00 PM  Medical Record Number: [id number] Patient Account Number: [id number] Date of Birth/Sex: Treating RN: 02-17-1986 (37 y.o. M) Primary Care Provider: Avon Gully Other Clinician: Referring Provider: Treating Provider/Extender: Leeroy Bock Weeks in Treatment: 5 Subjective Chief Complaint Information obtained from Patient 02/08/2023; right buttocks wound History of Present Illness (HPI) 02/08/2023 Mr. My Madariaga is a 37 year old male with a past medical history of uncontrolled type 1 diabetes on insulin, CVA and neurosyphilis that presents the clinic for a 31-month history of right buttocks wound secondary to necrotizing soft tissue infection. On 09/12/2022 he was admitted to the hospital for necrotizing soft tissue infection of the right buttocks and had OR debridement with a wound VAC and given IV antibiotics. His admission was complicated by diabetic ketoacidosis and required ICU care with intubation. He was subsequently discharged to a skilled nursing  facility. Unfortunately he was readmitted to the hospital on 10/18/2022 with cardiac arrest and found to be in septic shock. This was thought to be due to hyperkalemia from DKA. At that time the wound was noted to be well-healing with no obvious signs of infection. For the past several months he has had home health changing the dressing 3 times weekly with silver alginate. He currently denies signs of infection. 5/30; patient presents for follow-up. He has been using Hydrofera Blue to the wound bed being changed 3 times weekly with home health. He has no issues or complaints today. Wound is smaller. 6/11; patient presents for follow-up. He has been using Hydrofera Blue to the wound bed. He has no issues or complaints today. Wound is again smaller. 6/25; patient presents for follow-up. He has been using Hydrofera Blue to the wound bed. Wound is smaller. Patient History Information obtained from Patient. Family History Cancer - Maternal Grandparents, Diabetes - Father,Maternal Grandparents,Paternal Grandparents, Hypertension - Mother, Kidney Disease - Father. Social History Current every day smoker - 1/2 ppd, Marital Status - Single, Alcohol Use - Never, Drug Use - Current History - marijuana, Caffeine Use - Daily. Nathaniel [name], Nathaniel [name] ([id number]) [id number] Page 5 of 8 Medical History Cardiovascular Patient has history of Hypertension Endocrine Patient has history of Type I Diabetes Denies history of Type II Diabetes Medical A Surgical History Notes nd Constitutional Symptoms (General Health) Stroke 2020 Eyes retinopathy left eye Cardiovascular cardiac arrest 09/2022 Genitourinary CKD stage III Neurologic CVA 2019 Objective Constitutional respirations regular, non-labored and within target range for patient.. Vitals Time Taken: 3:13 PM, Height: 71 in, Weight: 149 lbs, BMI: 20.8, Temperature: 98.5 F, Pulse: 91 bpm, Respiratory Rate: 18 breaths/min, Blood  Pressure: 154/91 mmHg. Psychiatric pleasant and cooperative. General Notes: T the lateral aspect over the right buttocks there is an open wound with granulation tissue and hyper granulated areas. Circumferentially there o is epithelized tissue. No signs of infection including increased warmth, erythema or purulent drainage. Integumentary (Hair, Skin) Wound #1 status is Open. Original cause of wound was Gradually Appeared. The date acquired was: 09/12/2022. The wound has been in treatment 5 weeks. The wound is located on the Right Gluteus. The wound measures 6.3cm length x 5.8cm width x 0.1cm depth; 28.698cm^2 area and 2.87cm^3 volume. There is Fat Layer (Subcutaneous Tissue) exposed. There is no tunneling or undermining noted. There is a medium amount of serosanguineous drainage noted. The wound margin is distinct with the outline attached to the wound base. There is medium (34-66%) red, pink granulation within the wound bed. There is a medium (34-66%) amount of necrotic tissue within the wound bed including Adherent Slough. The  periwound skin appearance had no abnormalities noted for color. The periwound skin appearance exhibited: Scarring. The periwound skin appearance did not exhibit: Callus, Crepitus, Excoriation, Induration, Rash, Dry/Scaly, Maceration. Periwound temperature was noted as No Abnormality. Assessment Active Problems ICD-10 Non-pressure chronic ulcer of skin of other sites with fat layer exposed Unspecified open wound of right buttock, initial encounter Type 1 diabetes mellitus with other skin ulcer Cutaneous abscess, unspecified Cannabis abuse, uncomplicated Patient's wound has shown improvement in size and appearance since last clinic visit. I used silver nitrate to the hyper granulated areas. I recommended continuing with Hydrofera Blue and aggressive offloading. Follow-up in 2 weeks. Procedures Wound #1 Pre-procedure diagnosis of Wound #1 is an Abscess located on the  Right Gluteus . An Chemical Cauterization procedure was performed by Geralyn Corwin, DO. Post procedure Diagnosis Wound #1: Same as Pre-Procedure Nathaniel [name], Nathaniel [name] ([id number]) [phone].pdf Page 6 of 8 Plan Follow-up Appointments: Return Appointment in 2 weeks. - Dr. Mikey Bussing room 8 03/29/23 at 2:30PM Anesthetic: Wound #1 Right Gluteus: (In clinic) Topical Lidocaine 5% applied to wound bed Bathing/ Shower/ Hygiene: May shower and wash wound with soap and water. - apply clean dressing after washing Off-Loading: Wound #1 Right Gluteus: Turn and reposition every 2 hours Home Health: Wound #1 Right Gluteus: No change in wound care orders this week; continue Home Health for wound care. May utilize formulary equivalent dressing for wound treatment orders unless otherwise specified. - change dressing to hydrofera blue to be changed 3xwk; please ensure the hydrofera blue ready (if used) writing is side up. Dressing changes to be completed by Home Health on Monday / Wednesday / Friday except when patient has scheduled visit at [facility]. Other Home Health Orders/Instructions: - [facility] WOUND #1: - Gluteus Wound Laterality: Right Cleanser: Soap and Water 3 x Per Week/30 Days Discharge Instructions: May shower and wash wound with dial antibacterial soap and water prior to dressing change. Cleanser: Wound Cleanser 3 x Per Week/30 Days Discharge Instructions: Cleanse the wound with wound cleanser prior to applying a clean dressing using gauze sponges, not tissue or cotton balls. Peri-Wound Care: Skin Prep 3 x Per Week/30 Days Discharge Instructions: Use skin prep as directed Peri-Wound Care: vaseline 3 x Per Week/30 Days Discharge Instructions: apply to periwound as needed. Prim Dressing: Hydrofera Blue Ready Transfer Foam, 4x5 (in/in) 3 x Per Week/30 Days ary Discharge Instructions: Apply to wound bed as instructed Secondary Dressing: Zetuvit Plus Silicone  Border Sacrum Dressing, Sm, 7x7 (in/in) 3 x Per Week/30 Days Discharge Instructions: Apply silicone border over primary dressing as directed. 1. Silver nitrate 2. Hydrofera Blue 3. Aggressive offloading 4. Follow-up in 2 weeks Electronic Signature(s) Signed: 03/15/2023 4:25:00 PM By: Geralyn Corwin DO Entered By: Geralyn Corwin on 03/15/2023 16:00:47 -------------------------------------------------------------------------------- HxROS Details Patient Name: Date of Service: Nathaniel [name], RO NA LD K. 03/15/2023 3:00 PM Medical Record Number: [id number] Patient Account Number: [id number] Date of Birth/Sex: Treating RN: [birth year] (37 y.o. M) Primary Care Provider: Avon Gully Other Clinician: Referring Provider: Treating Provider/Extender: Leeroy Bock Weeks in Treatment: 5 Information Obtained From Patient Constitutional Symptoms (General Health) Medical History: Past Medical History Notes: Stroke 2020 Eyes Medical History: Past Medical History Notes: retinopathy left eye Cardiovascular Medical History: Positive for: Hypertension Past Medical History NotesMarland Kitchen Nathaniel [name], Nathaniel [name] ([id number]) [id number] Page 7 of 8 cardiac arrest 09/2022 Endocrine Medical History: Positive for: Type I Diabetes Negative for: Type II Diabetes Treated with: Insulin Blood sugar tested every day: Yes Tested : Genitourinary Medical  History: Past Medical History Notes: CKD stage III Neurologic Medical History: Past Medical History Notes: CVA 2019 Immunizations Pneumococcal Vaccine: Received Pneumococcal Vaccination: No Implantable Devices None Family and Social History Cancer: Yes - Maternal Grandparents; Diabetes: Yes - Father,Maternal Grandparents,Paternal Grandparents; Hypertension: Yes - Mother; Kidney Disease: Yes - Father; Current every day smoker - 1/2 ppd; Marital Status - Single; Alcohol Use: Never; Drug Use: Current History -  marijuana; Caffeine Use: Daily; Financial Concerns: No; Food, Clothing or Shelter Needs: No; Support System Lacking: No; Transportation Concerns: No Electronic Signature(s) Signed: 03/15/2023 4:25:00 PM By: Geralyn Corwin DO Entered By: Geralyn Corwin on 03/15/2023 15:53:02 -------------------------------------------------------------------------------- SuperBill Details Patient Name: Date of Service: Nathaniel [name], RO NA LD K. 03/15/2023 Medical Record Number: [id number] Patient Account Number: [id number] Date of Birth/Sex: Treating RN: June 20, 1986 (37 y.o. M) Primary Care Provider: Avon Gully Other Clinician: Referring Provider: Treating Provider/Extender: Abbott Pao, [name] Weeks in Treatment: 5 Diagnosis Coding ICD-10 Codes Code Description [id number] Non-pressure chronic ulcer of skin of other sites with fat layer exposed S31.819A Unspecified open wound of right buttock, initial encounter E10.622 Type 1 diabetes mellitus with other skin ulcer L02.91 Cutaneous abscess, unspecified F12.10 Cannabis abuse, uncomplicated Facility Procedures : Nathaniel [name] CPT4 Code: [id number] ONMusa, Nathaniel [name] ([id number]) Description: 17250 - CHEM CAUT GRANULATION TISS ICD-10 Diagnosis Description S31.819A Unspecified open wound of right buttock, initial encounter [phone] Modifier: [id number] Quantity: 1 7.pdf Page 8 of 8 Physician Procedures : CPT4 Code Description Modifier [phone] [id number] - WC PHYS LEVEL 3 - EST PT ICD-10 Diagnosis Description L98.492 Non-pressure chronic ulcer of skin of other sites with fat layer exposed S31.819A Unspecified open wound of right buttock, initial encounter  E10.622 Type 1 diabetes mellitus with other skin ulcer Quantity: 1 : [phone] 17250 - WC PHYS CHEM CAUT GRAN TISSUE ICD-10 Diagnosis Description S31.819A Unspecified open wound of right buttock, initial encounter Quantity: 1 Electronic Signature(s) Signed: 03/15/2023 5:13:20 PM By: Karie Schwalbe RN Signed: 03/16/2023 1:59:43 PM By: Geralyn Corwin DO Previous Signature: 03/15/2023 4:25:00 PM Version By: Geralyn Corwin DO Entered By: Karie Schwalbe on 03/15/2023 16:52:36

## 2023-03-18 ENCOUNTER — Other Ambulatory Visit: Payer: Self-pay | Admitting: Endocrinology

## 2023-03-18 DIAGNOSIS — E1065 Type 1 diabetes mellitus with hyperglycemia: Secondary | ICD-10-CM

## 2023-03-20 ENCOUNTER — Other Ambulatory Visit: Payer: Self-pay | Admitting: Endocrinology

## 2023-03-29 ENCOUNTER — Ambulatory Visit (HOSPITAL_BASED_OUTPATIENT_CLINIC_OR_DEPARTMENT_OTHER): Payer: Medicare HMO | Admitting: Internal Medicine

## 2023-04-05 ENCOUNTER — Encounter (HOSPITAL_BASED_OUTPATIENT_CLINIC_OR_DEPARTMENT_OTHER): Payer: Medicare HMO | Attending: Internal Medicine | Admitting: Internal Medicine

## 2023-04-05 DIAGNOSIS — Z794 Long term (current) use of insulin: Secondary | ICD-10-CM | POA: Insufficient documentation

## 2023-04-05 DIAGNOSIS — F1210 Cannabis abuse, uncomplicated: Secondary | ICD-10-CM | POA: Diagnosis not present

## 2023-04-05 DIAGNOSIS — N183 Chronic kidney disease, stage 3 unspecified: Secondary | ICD-10-CM | POA: Diagnosis not present

## 2023-04-05 DIAGNOSIS — Z09 Encounter for follow-up examination after completed treatment for conditions other than malignant neoplasm: Secondary | ICD-10-CM | POA: Diagnosis not present

## 2023-04-05 DIAGNOSIS — E10622 Type 1 diabetes mellitus with other skin ulcer: Secondary | ICD-10-CM | POA: Diagnosis present

## 2023-04-05 DIAGNOSIS — Z8673 Personal history of transient ischemic attack (TIA), and cerebral infarction without residual deficits: Secondary | ICD-10-CM | POA: Diagnosis not present

## 2023-04-05 DIAGNOSIS — L98492 Non-pressure chronic ulcer of skin of other sites with fat layer exposed: Secondary | ICD-10-CM | POA: Diagnosis not present

## 2023-04-05 DIAGNOSIS — F17210 Nicotine dependence, cigarettes, uncomplicated: Secondary | ICD-10-CM | POA: Insufficient documentation

## 2023-04-05 DIAGNOSIS — X58XXXA Exposure to other specified factors, initial encounter: Secondary | ICD-10-CM | POA: Diagnosis not present

## 2023-04-05 DIAGNOSIS — I129 Hypertensive chronic kidney disease with stage 1 through stage 4 chronic kidney disease, or unspecified chronic kidney disease: Secondary | ICD-10-CM | POA: Insufficient documentation

## 2023-04-05 DIAGNOSIS — L0291 Cutaneous abscess, unspecified: Secondary | ICD-10-CM | POA: Diagnosis not present

## 2023-04-05 DIAGNOSIS — E1022 Type 1 diabetes mellitus with diabetic chronic kidney disease: Secondary | ICD-10-CM | POA: Diagnosis not present

## 2023-04-05 DIAGNOSIS — S31819A Unspecified open wound of right buttock, initial encounter: Secondary | ICD-10-CM | POA: Diagnosis not present

## 2023-04-05 DIAGNOSIS — Z8674 Personal history of sudden cardiac arrest: Secondary | ICD-10-CM | POA: Diagnosis not present

## 2023-04-06 NOTE — Progress Notes (Signed)
Nathaniel, Nathaniel ([id number]) [id number] Page 1 of 8 Visit Report for 04/05/2023 Chief Complaint Document Details Patient Name: Date of Service: Nathaniel Nathaniel, [name] 04/05/2023 2:00 PM Medical Record Number: [id number] Patient Account Number: [id number] Date of Birth/Sex: Treating RN: 10/01/1985 (37 y.o. M) Primary Care Provider: Avon Nathaniel Other Clinician: Referring Provider: Treating Provider/Extender: Nathaniel Nathaniel in Treatment: 8 Information Obtained from: Patient Chief Complaint 02/08/2023; right buttocks wound Electronic Signature(s) Signed: 04/05/2023 4:51:26 PM By: Nathaniel Corwin DO Entered By: Nathaniel Nathaniel on 04/05/2023 14:54:29 -------------------------------------------------------------------------------- Debridement Details Patient Name: Date of Service: Nathaniel Nathaniel, [place] NA LD K. 04/05/2023 2:00 PM Medical Record Number: [id number] Patient Account Number: [id number] Date of Birth/Sex: Treating RN: [birth year] (37 y.o. [name]: Avon Nathaniel Other Clinician: Referring Provider: Treating Provider/Extender: Nathaniel Nathaniel in Treatment: 8 Debridement Performed for Assessment: Wound #1 Right Gluteus Performed By: Physician Nathaniel Corwin, DO Debridement Type: Debridement Level of Consciousness (Pre-procedure): Awake and Alert Pre-procedure Verification/Time Out Yes - 14:10 Taken: Start Time: 14:11 Pain Control: Lidocaine 4% T opical Solution Percent of Wound Bed Debrided: 100% T Area Debrided (cm): otal 6.59 Tissue and other material debrided: Viable, Non-Viable, Slough, Skin: Dermis , Skin: Epidermis, Slough Level: Skin/Epidermis Debridement Description: Selective/Open Wound Instrument: Curette Bleeding: Minimum Hemostasis Achieved: Pressure End Time: 14:15 Procedural Pain: 0 Post Procedural Pain: 0 Response to Treatment: Procedure was  tolerated well Level of Consciousness (Post- Awake and Alert procedure): Post Debridement Measurements of Total Wound Length: (cm) 2.8 Width: (cm) 3 Depth: (cm) 0.1 Volume: (cm) 0.66 Character of Wound/Ulcer Post Debridement: Improved Nathaniel, Nathaniel ([id number]) [id number] Page 2 of 8 Post Procedure Diagnosis Same as Pre-procedure Electronic Signature(s) Signed: 04/05/2023 4:51:26 PM By: Nathaniel Corwin DO Signed: 04/05/2023 6:16:33 PM By: Nathaniel Nathaniel Entered By: Nathaniel Nathaniel on 04/05/2023 14:16:05 -------------------------------------------------------------------------------- HPI Details Patient Name: Date of Service: Nathaniel Nathaniel, [place] NA LD K. 04/05/2023 2:00 PM Medical Record Number: [id number] Patient Account Number: [id number] Date of Birth/Sex: Treating RN: 09-24-1985 (37 y.o. M) Primary Care Provider: Avon Nathaniel Other Clinician: Referring Provider: Treating Provider/Extender: Nathaniel Nathaniel in Treatment: 8 History of Present Illness HPI Description: 02/08/2023 Nathaniel Nathaniel is a 37 year old male with a past medical history of uncontrolled type 1 diabetes on insulin, CVA and neurosyphilis that presents the clinic for a 54-month history of right buttocks wound secondary to necrotizing soft tissue infection. On 09/12/2022 he was admitted to the hospital for necrotizing soft tissue infection of the right buttocks and had OR debridement with a wound VAC and given IV antibiotics. His admission was complicated by diabetic ketoacidosis and required ICU care with intubation. He was subsequently discharged to a skilled nursing facility. Unfortunately he was readmitted to the hospital on 10/18/2022 with cardiac arrest and found to be in septic shock. This was thought to be due to hyperkalemia from DKA. At that time the wound was noted to be well-healing with no obvious signs of infection. For the past several  months he has had home health changing the dressing 3 times weekly with silver alginate. He currently denies signs of infection. 5/30; patient presents for follow-up. He has been using Hydrofera Blue to the wound bed being changed 3 times weekly with home health. He has no issues or complaints today. Wound is smaller. 6/11; patient presents for follow-up. He has been using Hydrofera Blue to the wound bed. He has no issues or complaints today.  Wound is again smaller. 6/25; patient presents for follow-up. He has been using Hydrofera Blue to the wound bed. Wound is smaller. 7/16; patient presents for follow-up. He has been using Hydrofera Blue to the wound bed. Wound is smaller. Electronic Signature(s) Signed: 04/05/2023 4:51:26 PM By: Nathaniel Corwin DO Entered By: Nathaniel Nathaniel on 04/05/2023 14:56:20 -------------------------------------------------------------------------------- Physical Exam Details Patient Name: Date of Service: Nathaniel Nathaniel, [place] NA LD K. 04/05/2023 2:00 PM Medical Record Number: [id number] Patient Account Number: [id number] Date of Birth/Sex: Treating RN: [birth year] (37 y.o. M) Primary Care Provider: Avon Nathaniel Other Clinician: Referring Provider: Treating Provider/Extender: Nathaniel Nathaniel, Nathaniel Nathaniel Nathaniel in Treatment: 8 Constitutional respirations regular, non-labored and within target range for patient.. Cardiovascular 2+ dorsalis pedis/posterior tibialis pulses. Psychiatric pleasant and cooperative. Nathaniel, Nathaniel ([id number]) [id number] Page 3 of 8 Notes T the lateral aspect over the right buttocks there is an open wound with granulation tissue and slough. Circumferentially there is epithelized tissue. No signs of o infection including increased warmth, erythema or purulent drainage Electronic Signature(s) Signed: 04/05/2023 4:51:26 PM By: Nathaniel Corwin DO Entered By: Nathaniel Nathaniel on 04/05/2023  15:03:31 -------------------------------------------------------------------------------- Physician Orders Details Patient Name: Date of Service: Nathaniel Nathaniel, [place] NA LD K. 04/05/2023 2:00 PM Medical Record Number: [id number] Patient Account Number: [id number] Date of Birth/Sex: Treating RN: 15-May-1986 (37 y.o. [name]: Avon Nathaniel Other Clinician: Referring Provider: Treating Provider/Extender: Nathaniel Nathaniel in Treatment: 8 Verbal / Phone Orders: No Diagnosis Coding Follow-up Appointments ppointment in 2 Nathaniel. - Dr. Mikey Bussing 04/19/2023 3pm Return A Room 8 Tuesday Anesthetic Wound #1 Right Gluteus (In clinic) Topical Lidocaine 5% applied to wound bed Bathing/ Shower/ Hygiene May shower and wash wound with soap and water. - apply clean dressing after washing Off-Loading Wound #1 Right Gluteus Turn and reposition every 2 hours Home Health Wound #1 Right Gluteus No change in wound care orders this week; continue Home Health for wound care. May utilize formulary equivalent dressing for wound treatment orders unless otherwise specified. - change dressing to hydrofera blue to be changed 3xwk; please ensure the hydrofera blue ready (if used) writing is side up. Dressing changes to be completed by Home Health on Monday / Wednesday / Friday except when patient has scheduled visit at [facility]. Other Home Health Orders/Instructions: - [facility] Wound Treatment Wound #1 - Gluteus Wound Laterality: Right Cleanser: Soap and Water 3 x Per Week/30 Days Discharge Instructions: May shower and wash wound with dial antibacterial soap and water prior to dressing change. Cleanser: Wound Cleanser 3 x Per Week/30 Days Discharge Instructions: Cleanse the wound with wound cleanser prior to applying a clean dressing using gauze sponges, not tissue or cotton balls. Peri-Wound Care: Skin Prep 3 x Per Week/30 Days Discharge Instructions: Use  skin prep as directed Prim Dressing: Hydrofera Blue Ready Transfer Foam, 4x5 (in/in) 3 x Per Week/30 Days ary Discharge Instructions: Apply to wound bed as instructed Secondary Dressing: Zetuvit Plus Silicone Border Sacrum Dressing, Sm, 7x7 (in/in) 3 x Per Week/30 Days Discharge Instructions: Apply silicone border over primary dressing as directed. Electronic Signature(s) Signed: 04/05/2023 4:51:26 PM By: Nathaniel Corwin DO Entered By: Nathaniel Nathaniel on 04/05/2023 15:03:39 Sherrill Raring ([id number]) [id number] Page 4 of 8 -------------------------------------------------------------------------------- Problem List Details Patient Name: Date of Service: Nathaniel Nathaniel, [name] 04/05/2023 2:00 PM Medical Record Number: [id number] Patient Account Number: [id number] Date of Birth/Sex: Treating RN: 08-Jul-1986 (38 y.o. M) Primary Care Provider: Avon Nathaniel  Other Clinician: Referring Provider: Treating Provider/Extender: Nathaniel Nathaniel, Nathaniel Nathaniel Nathaniel in Treatment: 8 Active Problems ICD-10 Encounter Code Description Active Date MDM Diagnosis L98.492 Non-pressure chronic ulcer of skin of other sites with fat layer exposed 02/08/2023 No Yes S31.819A Unspecified open wound of right buttock, initial encounter 02/08/2023 No Yes E10.622 Type 1 diabetes mellitus with other skin ulcer 02/08/2023 No Yes L02.91 Cutaneous abscess, unspecified 02/08/2023 No Yes F12.10 Cannabis abuse, uncomplicated 02/08/2023 No Yes Inactive Problems Resolved Problems Electronic Signature(s) Signed: 04/05/2023 4:51:26 PM By: Nathaniel Corwin DO Entered By: Nathaniel Nathaniel on 04/05/2023 14:54:14 -------------------------------------------------------------------------------- Progress Note Details Patient Name: Date of Service: Nathaniel Nathaniel, [place] NA LD K. 04/05/2023 2:00 PM Medical Record Number: [id number] Patient Account Number: [id number] Date of Birth/Sex: Treating  RN: Jan 30, 1986 (37 y.o. M) Primary Care Provider: Avon Nathaniel Other Clinician: Referring Provider: Treating Provider/Extender: Nathaniel Nathaniel in Treatment: 8 Subjective Chief Complaint Information obtained from Patient 02/08/2023; right buttocks wound Nathaniel, Nathaniel ([id number]) [phone].pdf Page 5 of 8 History of Present Illness (HPI) 02/08/2023 Mr. Nathaniel Nathaniel is a 37 year old male with a past medical history of uncontrolled type 1 diabetes on insulin, CVA and neurosyphilis that presents the clinic for a 32-month history of right buttocks wound secondary to necrotizing soft tissue infection. On 09/12/2022 he was admitted to the hospital for necrotizing soft tissue infection of the right buttocks and had OR debridement with a wound VAC and given IV antibiotics. His admission was complicated by diabetic ketoacidosis and required ICU care with intubation. He was subsequently discharged to a skilled nursing facility. Unfortunately he was readmitted to the hospital on 10/18/2022 with cardiac arrest and found to be in septic shock. This was thought to be due to hyperkalemia from DKA. At that time the wound was noted to be well-healing with no obvious signs of infection. For the past several months he has had home health changing the dressing 3 times weekly with silver alginate. He currently denies signs of infection. 5/30; patient presents for follow-up. He has been using Hydrofera Blue to the wound bed being changed 3 times weekly with home health. He has no issues or complaints today. Wound is smaller. 6/11; patient presents for follow-up. He has been using Hydrofera Blue to the wound bed. He has no issues or complaints today. Wound is again smaller. 6/25; patient presents for follow-up. He has been using Hydrofera Blue to the wound bed. Wound is smaller. 7/16; patient presents for follow-up. He has been using Hydrofera Blue to the  wound bed. Wound is smaller. Patient History Information obtained from Patient. Family History Cancer - Maternal Grandparents, Diabetes - Father,Maternal Grandparents,Paternal Grandparents, Hypertension - Mother, Kidney Disease - Father. Social History Current every day smoker - 1/2 ppd, Marital Status - Single, Alcohol Use - Never, Drug Use - Current History - marijuana, Caffeine Use - Daily. Medical History Cardiovascular Patient has history of Hypertension Endocrine Patient has history of Type I Diabetes Denies history of Type II Diabetes Medical A Surgical History Notes nd Constitutional Symptoms (General Health) Stroke 2020 Eyes retinopathy left eye Cardiovascular cardiac arrest 09/2022 Genitourinary CKD stage III Neurologic CVA 2019 Objective Constitutional respirations regular, non-labored and within target range for patient.. Vitals Time Taken: 1:54 PM, Height: 71 in, Weight: 149 lbs, BMI: 20.8, Temperature: 98.3 F, Pulse: 105 bpm, Respiratory Rate: 18 breaths/min, Blood Pressure: 122/77 mmHg. Cardiovascular 2+ dorsalis pedis/posterior tibialis pulses. Psychiatric pleasant and cooperative. General Notes: T the lateral aspect over the right buttocks there is an  open wound with granulation tissue and slough. Circumferentially there is epithelized o tissue. No signs of infection including increased warmth, erythema or purulent drainage Integumentary (Hair, Skin) Wound #1 status is Open. Original cause of wound was Gradually Appeared. The date acquired was: 09/12/2022. The wound has been in treatment 8 Nathaniel. The wound is located on the Right Gluteus. The wound measures 2.8cm length x 3cm width x 0.1cm depth; 6.597cm^2 area and 0.66cm^3 volume. There is Fat Layer (Subcutaneous Tissue) exposed. There is no tunneling or undermining noted. There is a medium amount of serosanguineous drainage noted. The wound margin is distinct with the outline attached to the wound base.  There is medium (34-66%) red, pink granulation within the wound bed. There is a medium (34-66%) amount of necrotic tissue within the wound bed including Adherent Slough. The periwound skin appearance had no abnormalities noted for color. The periwound skin appearance exhibited: Scarring. The periwound skin appearance did not exhibit: Callus, Crepitus, Excoriation, Induration, Rash, Dry/Scaly, Maceration. Periwound temperature was noted as No Abnormality. Assessment Nathaniel, Nathaniel ([id number]) [id number] Page 6 of 8 Active Problems ICD-10 Non-pressure chronic ulcer of skin of other sites with fat layer exposed Unspecified open wound of right buttock, initial encounter Type 1 diabetes mellitus with other skin ulcer Cutaneous abscess, unspecified Cannabis abuse, uncomplicated Patient's wound has shown improvement in size and appearance since last clinic visit. I debrided nonviable tissue. I recommended continuing Hydrofera Blue and aggressive offloading. Procedures Wound #1 Pre-procedure diagnosis of Wound #1 is an Abscess located on the Right Gluteus . There was a Selective/Open Wound Skin/Epidermis Debridement with a total area of 6.59 sq cm performed by Nathaniel Corwin, DO. With the following instrument(s): Curette to remove Viable and Non-Viable tissue/material. Material removed includes Slough, Skin: Dermis, and Skin: Epidermis after achieving pain control using Lidocaine 4% T opical Solution. A time out was conducted at 14:10, prior to the start of the procedure. A Minimum amount of bleeding was controlled with Pressure. The procedure was tolerated well with a pain level of 0 throughout and a pain level of 0 following the procedure. Post Debridement Measurements: 2.8cm length x 3cm width x 0.1cm depth; 0.66cm^3 volume. Character of Wound/Ulcer Post Debridement is improved. Post procedure Diagnosis Wound #1: Same as Pre-Procedure Plan Follow-up  Appointments: Return Appointment in 2 Nathaniel. - Dr. Mikey Bussing 04/19/2023 3pm Room 8 Tuesday Anesthetic: Wound #1 Right Gluteus: (In clinic) Topical Lidocaine 5% applied to wound bed Bathing/ Shower/ Hygiene: May shower and wash wound with soap and water. - apply clean dressing after washing Off-Loading: Wound #1 Right Gluteus: Turn and reposition every 2 hours Home Health: Wound #1 Right Gluteus: No change in wound care orders this week; continue Home Health for wound care. May utilize formulary equivalent dressing for wound treatment orders unless otherwise specified. - change dressing to hydrofera blue to be changed 3xwk; please ensure the hydrofera blue ready (if used) writing is side up. Dressing changes to be completed by Home Health on Monday / Wednesday / Friday except when patient has scheduled visit at [facility] [place] [facility]. Other Home Health Orders/Instructions: - [facility] WOUND #1: - Gluteus Wound Laterality: Right Cleanser: Soap and Water 3 x Per Week/30 Days Discharge Instructions: May shower and wash wound with dial antibacterial soap and water prior to dressing change. Cleanser: Wound Cleanser 3 x Per Week/30 Days Discharge Instructions: Cleanse the wound with wound cleanser prior to applying a clean dressing using gauze sponges, not tissue or cotton balls. Peri-Wound Care: Skin Prep 3  x Per Week/30 Days Discharge Instructions: Use skin prep as directed Prim Dressing: Hydrofera Blue Ready Transfer Foam, 4x5 (in/in) 3 x Per Week/30 Days ary Discharge Instructions: Apply to wound bed as instructed Secondary Dressing: Zetuvit Plus Silicone Border Sacrum Dressing, Sm, 7x7 (in/in) 3 x Per Week/30 Days Discharge Instructions: Apply silicone border over primary dressing as directed. 1. In office sharp debridement 2. Aggressive offloading 3. Follow-up in 2 Nathaniel Electronic Signature(s) Signed: 04/05/2023 4:51:26 PM By: Nathaniel Corwin DO Entered By: Nathaniel Nathaniel on 04/05/2023  15:03:55 Sherrill Raring ([id number]) [id number] Page 7 of 8 -------------------------------------------------------------------------------- HxROS Details Patient Name: Date of Service: Nathaniel Nathaniel, [name] 04/05/2023 2:00 PM Medical Record Number: [id number] Patient Account Number: [id number] Date of Birth/Sex: Treating RN: 1986/08/19 (37 y.o. M) Primary Care Provider: Avon Nathaniel Other Clinician: Referring Provider: Treating Provider/Extender: Nathaniel Nathaniel in Treatment: 8 Information Obtained From Patient Constitutional Symptoms (General Health) Medical History: Past Medical History Notes: Stroke 2020 Eyes Medical History: Past Medical History Notes: retinopathy left eye Cardiovascular Medical History: Positive for: Hypertension Past Medical History Notes: cardiac arrest 09/2022 Endocrine Medical History: Positive for: Type I Diabetes Negative for: Type II Diabetes Treated with: Insulin Blood sugar tested every day: Yes Tested : Genitourinary Medical History: Past Medical History Notes: CKD stage III Neurologic Medical History: Past Medical History Notes: CVA 2019 Immunizations Pneumococcal Vaccine: Received Pneumococcal Vaccination: No Implantable Devices None Family and Social History Cancer: Yes - Maternal Grandparents; Diabetes: Yes - Father,Maternal Grandparents,Paternal Grandparents; Hypertension: Yes - Mother; Kidney Disease: Yes - Father; Current every day smoker - 1/2 ppd; Marital Status - Single; Alcohol Use: Never; Drug Use: Current History - marijuana; Caffeine Use: Daily; Financial Concerns: No; Food, Clothing or Shelter Needs: No; Support System Lacking: No; Transportation Concerns: No Electronic Signature(s) Signed: 04/05/2023 4:51:26 PM By: Nathaniel Corwin DO Entered By: Nathaniel Nathaniel on 04/05/2023 14:56:26 [place], Nathaniel Nathaniel ([id number]) [id number]  Page 8 of 8 -------------------------------------------------------------------------------- SuperBill Details Patient Name: Date of Service: [facility], [name] 04/05/2023 Medical Record Number: [id number] Patient Account Number: [id number] Date of Birth/Sex: Treating RN: February 24, 1986 (37 y.o. [name]: Avon Nathaniel Other Clinician: Referring Provider: Treating Provider/Extender: Nathaniel Nathaniel in Treatment: 8 Diagnosis Coding ICD-10 Codes Code Description [phone] Non-pressure chronic ulcer of skin of other sites with fat layer exposed S31.819A Unspecified open wound of right buttock, initial encounter E10.622 Type 1 diabetes mellitus with other skin ulcer L02.91 Cutaneous abscess, unspecified F12.10 Cannabis abuse, uncomplicated Facility Procedures : CPT4 Code: [id number] Description: 97597 - DEBRIDE WOUND 1ST 20 SQ CM OR < ICD-10 Diagnosis Description L98.492 Non-pressure chronic ulcer of skin of other sites with fat layer exposed E10.622 Type 1 diabetes mellitus with other skin ulcer Modifier: Quantity: 1 Physician Procedures : CPT4 Code Description Modifier [id number] - WC PHYS DEBR WO ANESTH 20 SQ CM ICD-10 Diagnosis Description L98.492 Non-pressure chronic ulcer of skin of other sites with fat layer exposed E10.622 Type 1 diabetes mellitus with other skin ulcer Quantity: 1 Electronic Signature(s) Signed: 04/05/2023 4:51:26 PM By: Nathaniel Corwin DO Entered By: Nathaniel Nathaniel on 04/05/2023 15:04:18

## 2023-04-06 NOTE — Progress Notes (Signed)
LAYMOND, POSTLE ([id number]) [id number] Page 1 of 7 Visit Report for 04/05/2023 Arrival Information Details Patient Name: Date of Service: Nathaniel [name], [name] 04/05/2023 2:00 PM Medical Record Number: [id number] Patient Account Number: [id number] Date of Birth/Sex: Treating RN: August 26, 1986 (37 y.o. [name] Primary Care Anayah Arvanitis: Avon Gully Other Clinician: Referring Aaliyha Mumford: Treating Andres Bantz/Extender: Leeroy Bock Weeks in Treatment: 8 Visit Information History Since Last Visit All ordered tests and consults were completed: Yes Patient Arrived: Ambulatory Added or deleted any medications: No Arrival Time: 13:52 Any new allergies or adverse reactions: No Accompanied By: self Had a fall or experienced change in No Transfer Assistance: None activities of daily living that may affect Patient Identification Verified: Yes risk of falls: Secondary Verification Process Completed: Yes Signs or symptoms of abuse/neglect since last visito No Patient Requires Transmission-Based Precautions: No Hospitalized since last visit: No Patient Has Alerts: No Implantable device outside of the clinic excluding No cellular tissue based products placed in the center since last visit: Has Dressing in Place as Prescribed: Yes Pain Present Now: No Electronic Signature(s) Signed: 04/06/2023 7:59:22 AM By: Brenton Grills Entered By: Brenton Grills on 04/05/2023 13:53:07 -------------------------------------------------------------------------------- Encounter Discharge Information Details Patient Name: Date of Service: Nathaniel [name], Nathaniel NA LD K. 04/05/2023 2:00 PM Medical Record Number: [id number] Patient Account Number: [id number] Date of Birth/Sex: Treating RN: 1985-11-06 (37 y.o. [name] Primary Care Kele Withem: Avon Gully Other Clinician: Referring Nicklos Gaxiola: Treating Dyer Klug/Extender: Leeroy Bock Weeks in Treatment: 8 Encounter Discharge Information Items Post Procedure Vitals Discharge Condition: Stable Temperature (F): 98.3 Ambulatory Status: Cane Pulse (bpm): 105 Discharge Destination: Home Respiratory Rate (breaths/min): 18 Transportation: Private Auto Blood Pressure (mmHg): 122/77 Accompanied By: mother Schedule Follow-up Appointment: Yes Clinical Summary of Care: Electronic Signature(s) Signed: 04/05/2023 6:16:33 PM By: Shawn Stall RN, BSN Entered By: Shawn Stall on 04/05/2023 14:23:29 Nathaniel [name] ([id number]) [id number] Page 2 of 7 -------------------------------------------------------------------------------- Lower Extremity Assessment Details Patient Name: Date of Service: Nathaniel [name], [name] 04/05/2023 2:00 PM Medical Record Number: [id number] Patient Account Number: [id number] Date of Birth/Sex: Treating RN: July 21, 1986 (37 y.o. [name] Primary Care Amylia Collazos: Avon Gully Other Clinician: Referring Railynn Ballo: Treating Imani Fiebelkorn/Extender: Leeroy Bock Weeks in Treatment: 8 Electronic Signature(s) Signed: 04/06/2023 7:59:22 AM By: Brenton Grills Entered By: Brenton Grills on 04/05/2023 13:55:34 -------------------------------------------------------------------------------- Multi Wound Chart Details Patient Name: Date of Service: Nathaniel [name], Nathaniel NA LD K. 04/05/2023 2:00 PM Medical Record Number: [id number] Patient Account Number: [id number] Date of Birth/Sex: Treating RN: 18-May-1986 (37 y.o. M) Primary Care Johara Lodwick: Avon Gully Other Clinician: Referring Dovie Kapusta: Treating Vermelle Cammarata/Extender: [name], [name] Weeks in Treatment: 8 Vital Signs Height(in): 71 Pulse(bpm): 105 Weight(lbs): 149 Blood Pressure(mmHg): 122/77 Body Mass Index(BMI): 20.8 Temperature(F): 98.3 Respiratory Rate(breaths/min): 18 [1:Photos:] [[name]/A:[name]/A] Right Gluteus [name]/A [name]/A Wound  Location: Gradually Appeared [name]/A [name]/A Wounding Event: Abscess [name]/A [name]/A Primary Etiology: Hypertension, Type I Diabetes [name]/A [name]/A Comorbid History: 09/12/2022 [name]/A [name]/A Date Acquired: 8 [name]/A [name]/A Weeks of Treatment: Open [name]/A [name]/A Wound Status: No [name]/A [name]/A Wound Recurrence: 2.8x3x0.1 [name]/A [name]/A Measurements L x W x D (cm) 6.597 [name]/A [name]/A A (cm) : rea 0.66 [name]/A [name]/A Volume (cm) : 87.90% [name]/A [name]/A % Reduction in A rea: 87.90% [name]/A [name]/A % Reduction in Volume: Full Thickness Without Exposed [name]/A [name]/A Classification: Support Structures Medium [name]/A [name]/A Exudate Amount: Serosanguineous [name]/A [name]/A Exudate Type: red, brown [name]/A [name]/A Exudate Color: Distinct, outline attached [name]/A  [name]/A Wound Margin: Medium (34-66%) [name]/A [name]/A Granulation Amount: Red, Pink [name]/A [name]/A Granulation Quality: Medium (34-66%) [name]/A [name]/A Necrotic Amount: Nathaniel [name], Nathaniel [name] ([id number]) [id number] Page 3 of 7 Fat Layer (Subcutaneous Tissue): Yes [name]/A [name]/A Exposed Structures: Fascia: No Tendon: No Muscle: No Joint: No Bone: No Medium (34-66%) [name]/A [name]/A Epithelialization: Debridement - Selective/Open Wound [name]/A [name]/A Debridement: Pre-procedure Verification/Time Out 14:10 [name]/A [name]/A Taken: Lidocaine 4% Topical Solution [name]/A [name]/A Pain Control: Slough [name]/A [name]/A Tissue Debrided: Skin/Epidermis [name]/A [name]/A Level: 6.59 [name]/A [name]/A Debridement A (sq cm): rea Curette [name]/A [name]/A Instrument: Minimum [name]/A [name]/A Bleeding: Pressure [name]/A [name]/A Hemostasis A chieved: 0 [name]/A [name]/A Procedural Pain: 0 [name]/A [name]/A Post Procedural Pain: Procedure was tolerated well [name]/A [name]/A Debridement Treatment Response: 2.8x3x0.1 [name]/A [name]/A Post Debridement Measurements L x W x D (cm) 0.66 [name]/A [name]/A Post Debridement Volume: (cm) Scarring: Yes [name]/A [name]/A Periwound Skin Texture: Excoriation: No Induration: No Callus: No Crepitus: No Rash: No Maceration: No [name]/A [name]/A Periwound Skin Moisture: Dry/Scaly: No Atrophie Blanche: No [name]/A [name]/A Periwound Skin  Color: Cyanosis: No Ecchymosis: No Erythema: No Hemosiderin Staining: No Mottled: No Pallor: No Rubor: No No Abnormality [name]/A [name]/A Temperature: Debridement [name]/A [name]/A Procedures Performed: Treatment Notes Wound #1 (Gluteus) Wound Laterality: Right Cleanser Soap and Water Discharge Instruction: May shower and wash wound with dial antibacterial soap and water prior to dressing change. Wound Cleanser Discharge Instruction: Cleanse the wound with wound cleanser prior to applying a clean dressing using gauze sponges, not tissue or cotton balls. Peri-Wound Care Skin Prep Discharge Instruction: Use skin prep as directed Topical Primary Dressing Hydrofera Blue Ready Transfer Foam, 4x5 (in/in) Discharge Instruction: Apply to wound bed as instructed Secondary Dressing Zetuvit Plus Silicone Border Sacrum Dressing, Sm, 7x7 (in/in) Discharge Instruction: Apply silicone border over primary dressing as directed. Secured With Compression Wrap Compression Stockings Add-Ons Electronic Signature(s) Signed: 04/05/2023 4:51:26 PM By: Geralyn Corwin DO Entered By: Geralyn Corwin on 04/05/2023 14:54:20 Nathaniel [name] ([id number]) [id number] Page 4 of 7 -------------------------------------------------------------------------------- Multi-Disciplinary Care Plan Details Patient Name: Date of Service: Nathaniel [name], [name] 04/05/2023 2:00 PM Medical Record Number: [id number] Patient Account Number: [id number] Date of Birth/Sex: Treating RN: 12/20/1985 (37 y.o. [name] Primary Care Adalay Azucena: Avon Gully Other Clinician: Referring Jamelyn Bovard: Treating Dayanis Bergquist/Extender: Leeroy Bock Weeks in Treatment: 8 Active Inactive Nutrition Nursing Diagnoses: Impaired glucose control: actual or potential Goals: Patient/caregiver verbalizes understanding of need to maintain therapeutic glucose control per primary care physician Date  Initiated: 02/08/2023 Target Resolution Date: 06/18/2023 Goal Status: Active Patient/caregiver will maintain therapeutic glucose control Date Initiated: 02/08/2023 Target Resolution Date: 06/18/2023 Goal Status: Active Interventions: Assess HgA1c results as ordered upon admission and as needed Assess patient nutrition upon admission and as needed per policy Provide education on elevated blood sugars and impact on wound healing Notes: Wound/Skin Impairment Nursing Diagnoses: Impaired tissue integrity Knowledge deficit related to smoking impact on wound healing Knowledge deficit related to ulceration/compromised skin integrity Goals: Patient/caregiver will verbalize understanding of skin care regimen Date Initiated: 02/08/2023 Target Resolution Date: 06/18/2023 Goal Status: Active Ulcer/skin breakdown will have a volume reduction of 30% by week 4 Date Initiated: 02/08/2023 Target Resolution Date: 06/18/2023 Goal Status: Active Interventions: Assess patient/caregiver ability to obtain necessary supplies Assess patient/caregiver ability to perform ulcer/skin care regimen upon admission and as needed Assess ulceration(s) every visit Provide education on smoking Provide education on ulcer and skin care Notes: Electronic Signature(s) Signed: 04/05/2023 6:16:33 PM By: Shawn Stall RN, BSN Entered By:  Shawn Stall on 04/05/2023 14:17:07 Nathaniel [name], Nathaniel [name] ([id number]) [phone].pdf Page 5 of 7 -------------------------------------------------------------------------------- Pain Assessment Details Patient Name: Date of Service: Nathaniel [name], [name] 04/05/2023 2:00 PM Medical Record Number: [id number] Patient Account Number: [id number] Date of Birth/Sex: Treating RN: 11-21-1985 (37 y.o. [name] Primary Care Delpha Perko: Avon Gully Other Clinician: Referring Shadrack Brummitt: Treating Nubia Ziesmer/Extender: Leeroy Bock Weeks in Treatment:  8 Active Problems Location of Pain Severity and Description of Pain Patient Has Paino No Site Locations Pain Management and Medication Current Pain Management: Electronic Signature(s) Signed: 04/06/2023 7:59:22 AM By: Brenton Grills Entered By: Brenton Grills on 04/05/2023 13:55:23 -------------------------------------------------------------------------------- Patient/Caregiver Education Details Patient Name: Date of Service: Nathaniel [name], Nathaniel NA LD K. 7/16/2024andnbsp2:00 PM Medical Record Number: [id number] Patient Account Number: [id number] Date of Birth/Gender: Treating RN: [birth year] (37 y.o. [name] Primary Care Physician: Avon Gully Other Clinician: Referring Physician: Treating Physician/Extender: Leeroy Bock Weeks in Treatment: 8 Education Assessment Education Provided To: Patient and Caregiver mother Education Topics Provided Nutrition: Handouts: Nutrition Methods: Explain/Verbal Responses: Reinforcements needed Electronic Signature(s) Signed: 04/05/2023 6:16:33 PM By: Shawn Stall RN, BSN Nathaniel [name], Nathaniel [name] ([id number]) [id number] Page 6 of 7 Entered By: Shawn Stall on 04/05/2023 14:22:27 -------------------------------------------------------------------------------- Wound Assessment Details Patient Name: Date of Service: Nathaniel [name], [name] 04/05/2023 2:00 PM Medical Record Number: [id number] Patient Account Number: [id number] Date of Birth/Sex: Treating RN: May 10, 1986 (37 y.o. [name] Primary Care Jayvan Mcshan: Avon Gully Other Clinician: Referring Yazir Koerber: Treating Anh Mangano/Extender: Leeroy Bock Weeks in Treatment: 8 Wound Status Wound Number: 1 Primary Etiology: Abscess Wound Location: Right Gluteus Wound Status: Open Wounding Event: Gradually Appeared Comorbid History: Hypertension, Type I Diabetes Date Acquired: 09/12/2022 Weeks Of Treatment:  8 Clustered Wound: No Photos Wound Measurements Length: (cm) 2.8 Width: (cm) 3 Depth: (cm) 0.1 Area: (cm) 6.597 Volume: (cm) 0.66 % Reduction in Area: 87.9% % Reduction in Volume: 87.9% Epithelialization: Medium (34-66%) Tunneling: No Undermining: No Wound Description Classification: Full Thickness Without Exposed Support Wound Margin: Distinct, outline attached Exudate Amount: Medium Exudate Type: Serosanguineous Exudate Color: red, brown Structures Foul Odor After Cleansing: No Slough/Fibrino Yes Wound Bed Granulation Amount: Medium (34-66%) Exposed Structure Granulation Quality: Red, Pink Fascia Exposed: No Necrotic Amount: Medium (34-66%) Fat Layer (Subcutaneous Tissue) Exposed: Yes Necrotic Quality: Adherent Slough Tendon Exposed: No Muscle Exposed: No Joint Exposed: No Bone Exposed: No Periwound Skin Texture Texture Color No Abnormalities Noted: No No Abnormalities Noted: Yes Callus: No Temperature / Pain Crepitus: No Temperature: No Abnormality Excoriation: No Induration: No Rash: No Scarring: [address] CAIO, DEVERA ([id number]) [id number] Page 7 of 7 No Abnormalities Noted: No Dry / Scaly: No Maceration: No Treatment Notes Wound #1 (Gluteus) Wound Laterality: Right Cleanser Soap and Water Discharge Instruction: May shower and wash wound with dial antibacterial soap and water prior to dressing change. Wound Cleanser Discharge Instruction: Cleanse the wound with wound cleanser prior to applying a clean dressing using gauze sponges, not tissue or cotton balls. Peri-Wound Care Skin Prep Discharge Instruction: Use skin prep as directed Topical Primary Dressing Hydrofera Blue Ready Transfer Foam, 4x5 (in/in) Discharge Instruction: Apply to wound bed as instructed Secondary Dressing Zetuvit Plus Silicone Border Sacrum Dressing, Sm, 7x7 (in/in) Discharge Instruction: Apply silicone border over primary dressing as  directed. Secured With Compression Wrap Compression Stockings Add-Ons Electronic Signature(s) Signed: 04/06/2023 7:59:22 AM By: Brenton Grills Entered By: Brenton Grills on 04/05/2023 14:03:43 -------------------------------------------------------------------------------- Vitals Details Patient Name: Date of Service:  Nathaniel [name], Nathaniel NA LD K. 04/05/2023 2:00 PM Medical Record Number: [id number] Patient Account Number: [id number] Date of Birth/Sex: Treating RN: 02-18-1986 (37 y.o. [name] Primary Care Cashius Grandstaff: Avon Gully Other Clinician: Referring Deaisa Merida: Treating Acel Natzke/Extender: Leeroy Bock Weeks in Treatment: 8 Vital Signs Time Taken: 13:54 Temperature (F): 98.3 Height (in): 71 Pulse (bpm): 105 Weight (lbs): 149 Respiratory Rate (breaths/min): 18 Body Mass Index (BMI): 20.8 Blood Pressure (mmHg): 122/77 Reference Range: 80 - 120 mg / dl Electronic Signature(s) Signed: 04/06/2023 7:59:22 AM By: Brenton Grills Entered By: Brenton Grills on 04/05/2023 13:55:16

## 2023-04-19 ENCOUNTER — Encounter (HOSPITAL_BASED_OUTPATIENT_CLINIC_OR_DEPARTMENT_OTHER): Payer: Medicare HMO | Admitting: Internal Medicine

## 2023-04-19 DIAGNOSIS — E10622 Type 1 diabetes mellitus with other skin ulcer: Secondary | ICD-10-CM | POA: Diagnosis not present

## 2023-04-19 DIAGNOSIS — S31819A Unspecified open wound of right buttock, initial encounter: Secondary | ICD-10-CM | POA: Diagnosis not present

## 2023-04-19 DIAGNOSIS — L98492 Non-pressure chronic ulcer of skin of other sites with fat layer exposed: Secondary | ICD-10-CM

## 2023-04-19 NOTE — Progress Notes (Signed)
KAEGAN, CHISM ([id number]) [id number] Page 1 of 7 Visit Report for 04/19/2023 Chief Complaint Document Details Patient Name: Date of Service: Nathaniel [name], [name] 04/19/2023 3:00 PM Medical Record Number: [id number] Patient Account Number: [id number] Date of Birth/Sex: Treating RN: 1986/03/25 (37 y.o. M) Primary Care Provider: Avon Gully Other Clinician: Referring Provider: Treating Provider/Extender: Abbott Pao, [name] Weeks in Treatment: 10 Information Obtained from: Patient Chief Complaint 02/08/2023; right buttocks wound Electronic Signature(s) Signed: 04/19/2023 4:37:23 PM By: Geralyn Corwin DO Entered By: Geralyn Corwin on 04/19/2023 16:29:47 -------------------------------------------------------------------------------- HPI Details Patient Name: Date of Service: Nathaniel [name], [place] NA LD K. 04/19/2023 3:00 PM Medical Record Number: [id number] Patient Account Number: [id number] Date of Birth/Sex: Treating RN: Oct 09, 1985 (37 y.o. M) Primary Care Provider: Avon Gully Other Clinician: Referring Provider: Treating Provider/Extender: Leeroy Bock Weeks in Treatment: 10 History of Present Illness HPI Description: 02/08/2023 [name] is a 37 year old male with a past medical history of uncontrolled type 1 diabetes on insulin, CVA and neurosyphilis that presents the clinic for a 31-month history of right buttocks wound secondary to necrotizing soft tissue infection. On 09/12/2022 he was admitted to the hospital for necrotizing soft tissue infection of the right buttocks and had OR debridement with a wound VAC and given IV antibiotics. His admission was complicated by diabetic ketoacidosis and required ICU care with intubation. He was subsequently discharged to a skilled nursing facility. Unfortunately he was readmitted to the hospital on 10/18/2022 with cardiac arrest and found to be in septic shock.  This was thought to be due to hyperkalemia from DKA. At that time the wound was noted to be well-healing with no obvious signs of infection. For the past several months he has had home health changing the dressing 3 times weekly with silver alginate. He currently denies signs of infection. 5/30; patient presents for follow-up. He has been using Hydrofera Blue to the wound bed being changed 3 times weekly with home health. He has no issues or complaints today. Wound is smaller. 6/11; patient presents for follow-up. He has been using Hydrofera Blue to the wound bed. He has no issues or complaints today. Wound is again smaller. 6/25; patient presents for follow-up. He has been using Hydrofera Blue to the wound bed. Wound is smaller. 7/16; patient presents for follow-up. He has been using Hydrofera Blue to the wound bed. Wound is smaller. 7/30; patient presents for follow-up. He has been using Hydrofera Blue to the wound bed. Wound is smaller. It is almost healed. Electronic Signature(s) Signed: 04/19/2023 4:37:23 PM By: Geralyn Corwin DO Entered By: Geralyn Corwin on 04/19/2023 16:30:18 Nathaniel [name] ([id number]) [id number] Page 2 of 7 -------------------------------------------------------------------------------- Physical Exam Details Patient Name: Date of Service: Nathaniel [name], [name] 04/19/2023 3:00 PM Medical Record Number: [id number] Patient Account Number: [id number] Date of Birth/Sex: Treating RN: 1986/03/11 (37 y.o. M) Primary Care Provider: Avon Gully Other Clinician: Referring Provider: Treating Provider/Extender: Abbott Pao, [name] Weeks in Treatment: 10 Constitutional respirations regular, non-labored and within target range for patient.Marland Kitchen Psychiatric pleasant and cooperative. Notes T the lateral aspect over the right buttocks there is an open wound with granulation tissue. Circumferentially there is epithelized tissue.  No signs of infection o including increased warmth, erythema or purulent drainage Electronic Signature(s) Signed: 04/19/2023 4:37:23 PM By: Geralyn Corwin DO Entered By: Geralyn Corwin on 04/19/2023 16:31:03 -------------------------------------------------------------------------------- Physician Orders Details Patient Name: Date of Service: Nathaniel [name], [place] NA LD K. 04/19/2023 3:00  PM Medical Record Number: [id number] Patient Account Number: [id number] Date of Birth/Sex: Treating RN: 21-Jan-1986 (37 y.o. [name]: Avon Gully Other Clinician: Referring Provider: Treating Provider/Extender: Leeroy Bock Weeks in Treatment: 10 Verbal / Phone Orders: No Diagnosis Coding Follow-up Appointments ppointment in 2 weeks. - Dr. Mikey Bussing 05/03/2023 215pm Return A Room 8 Tuesday Anesthetic Wound #1 Right Gluteus (In clinic) Topical Lidocaine 5% applied to wound bed Bathing/ Shower/ Hygiene May shower and wash wound with soap and water. - apply clean dressing after washing Off-Loading Wound #1 Right Gluteus Turn and reposition every 2 hours Home Health Wound #1 Right Gluteus No change in wound care orders this week; continue Home Health for wound care. May utilize formulary equivalent dressing for wound treatment orders unless otherwise specified. - change dressing to hydrofera blue to be changed 3xwk; please ensure the hydrofera blue ready (if used) writing is side up. Dressing changes to be completed by Home Health on Monday / Wednesday / Friday except when patient has scheduled visit at [facility]. Other Home Health Orders/Instructions: - [facility]ound [facility] TODDY, WAUGH ([id number]) [phone].pdf Page 3 of 7 Wound #1 - Gluteus Wound Laterality: Right Cleanser: Soap and Water 3 x Per Week/30 Days Discharge Instructions: May shower and wash wound with dial antibacterial soap and water  prior to dressing change. Cleanser: Wound Cleanser 3 x Per Week/30 Days Discharge Instructions: Cleanse the wound with wound cleanser prior to applying a clean dressing using gauze sponges, not tissue or cotton balls. Peri-Wound Care: Skin Prep 3 x Per Week/30 Days Discharge Instructions: Use skin prep as directed Prim Dressing: Hydrofera Blue Ready Transfer Foam, 4x5 (in/in) 3 x Per Week/30 Days ary Discharge Instructions: Apply to wound bed as instructed Secondary Dressing: Zetuvit Plus Silicone Border Sacrum Dressing, Sm, 7x7 (in/in) 3 x Per Week/30 Days Discharge Instructions: Apply silicone border over primary dressing as directed. Electronic Signature(s) Signed: 04/19/2023 4:37:23 PM By: Geralyn Corwin DO Entered By: Geralyn Corwin on 04/19/2023 16:31:11 -------------------------------------------------------------------------------- Problem List Details Patient Name: Date of Service: Nathaniel [name], [place] NA LD K. 04/19/2023 3:00 PM Medical Record Number: [id number] Patient Account Number: [id number] Date of Birth/Sex: Treating RN: [birth year] (37 y.o. M) Primary Care Provider: Avon Gully Other Clinician: Referring Provider: Treating Provider/Extender: Leeroy Bock Weeks in Treatment: 10 Active Problems ICD-10 Encounter Code Description Active Date MDM Diagnosis L98.492 Non-pressure chronic ulcer of skin of other sites with fat layer exposed 02/08/2023 No Yes S31.819A Unspecified open wound of right buttock, initial encounter 02/08/2023 No Yes E10.622 Type 1 diabetes mellitus with other skin ulcer 02/08/2023 No Yes L02.91 Cutaneous abscess, unspecified 02/08/2023 No Yes F12.10 Cannabis abuse, uncomplicated 02/08/2023 No Yes Inactive Problems Resolved Problems Electronic Signature(s) Signed: 04/19/2023 4:37:23 PM By: Geralyn Corwin DO Entered By: Geralyn Corwin on 04/19/2023 16:29:35 Nathaniel [name] ([id number]) [id number]  Page 4 of 7 -------------------------------------------------------------------------------- Progress Note Details Patient Name: Date of Service: Nathaniel [name], [name] 04/19/2023 3:00 PM Medical Record Number: [id number] Patient Account Number: [id number] Date of Birth/Sex: Treating RN: [birth year] (37 y.o. M) Primary Care Provider: Avon Gully Other Clinician: Referring Provider: Treating Provider/Extender: Leeroy Bock Weeks in Treatment: 10 Subjective Chief Complaint Information obtained from Patient 02/08/2023; right buttocks wound History of Present Illness (HPI) 02/08/2023 [name] is a 37 year old male with a past medical history of uncontrolled type 1 diabetes on insulin, CVA and neurosyphilis that presents the clinic for a 79-month history of right  buttocks wound secondary to necrotizing soft tissue infection. On 09/12/2022 he was admitted to the hospital for necrotizing soft tissue infection of the right buttocks and had OR debridement with a wound VAC and given IV antibiotics. His admission was complicated by diabetic ketoacidosis and required ICU care with intubation. He was subsequently discharged to a skilled nursing facility. Unfortunately he was readmitted to the hospital on 10/18/2022 with cardiac arrest and found to be in septic shock. This was thought to be due to hyperkalemia from DKA. At that time the wound was noted to be well-healing with no obvious signs of infection. For the past several months he has had home health changing the dressing 3 times weekly with silver alginate. He currently denies signs of infection. 5/30; patient presents for follow-up. He has been using Hydrofera Blue to the wound bed being changed 3 times weekly with home health. He has no issues or complaints today. Wound is smaller. 6/11; patient presents for follow-up. He has been using Hydrofera Blue to the wound bed. He has no issues or complaints today. Wound  is again smaller. 6/25; patient presents for follow-up. He has been using Hydrofera Blue to the wound bed. Wound is smaller. 7/16; patient presents for follow-up. He has been using Hydrofera Blue to the wound bed. Wound is smaller. 7/30; patient presents for follow-up. He has been using Hydrofera Blue to the wound bed. Wound is smaller. It is almost healed. Patient History Information obtained from Patient. Family History Cancer - Maternal Grandparents, Diabetes - Father,Maternal Grandparents,Paternal Grandparents, Hypertension - Mother, Kidney Disease - Father. Social History Current every day smoker - 1/2 ppd, Marital Status - Single, Alcohol Use - Never, Drug Use - Current History - marijuana, Caffeine Use - Daily. Medical History Cardiovascular Patient has history of Hypertension Endocrine Patient has history of Type I Diabetes Denies history of Type II Diabetes Medical A Surgical History Notes nd Constitutional Symptoms (General Health) Stroke 2020 Eyes retinopathy left eye Cardiovascular cardiac arrest 09/2022 Genitourinary CKD stage III Neurologic CVA 2019 Objective Constitutional Nathaniel [name], Nathaniel [name] ([id number]) [phone].pdf Page 5 of 7 respirations regular, non-labored and within target range for patient.. Vitals Time Taken: 3:21 PM, Height: 71 in, Weight: 149 lbs, BMI: 20.8, Temperature: 98.9 F, Pulse: 91 bpm, Respiratory Rate: 16 breaths/min, Blood Pressure: 99/62 mmHg. Psychiatric pleasant and cooperative. General Notes: T the lateral aspect over the right buttocks there is an open wound with granulation tissue. Circumferentially there is epithelized tissue. No o signs of infection including increased warmth, erythema or purulent drainage Integumentary (Hair, Skin) Wound #1 status is Open. Original cause of wound was Gradually Appeared. The date acquired was: 09/12/2022. The wound has been in treatment 10 weeks. The wound is located on  the Right Gluteus. The wound measures 0.5cm length x 1.5cm width x 0.1cm depth; 0.589cm^2 area and 0.059cm^3 volume. There is Fat Layer (Subcutaneous Tissue) exposed. There is no tunneling or undermining noted. There is a medium amount of serosanguineous drainage noted. The wound margin is distinct with the outline attached to the wound base. There is large (67-100%) red, pink granulation within the wound bed. There is a small (1- 33%) amount of necrotic tissue within the wound bed including Adherent Slough. The periwound skin appearance had no abnormalities noted for color. The periwound skin appearance exhibited: Scarring, Dry/Scaly. The periwound skin appearance did not exhibit: Callus, Crepitus, Excoriation, Induration, Rash, Maceration. Periwound temperature was noted as No Abnormality. Assessment Active Problems ICD-10 Non-pressure chronic ulcer of skin of other sites  with fat layer exposed Unspecified open wound of right buttock, initial encounter Type 1 diabetes mellitus with other skin ulcer Cutaneous abscess, unspecified Cannabis abuse, uncomplicated Patient's wound has shown improvement in size in appearance since last clinic visit. I recommended continuing the course with Hydrofera Blue and aggressive offloading. Follow-up in 2 weeks. Plan Follow-up Appointments: Return Appointment in 2 weeks. - Dr. Mikey Bussing 05/03/2023 215pm Room 8 Tuesday Anesthetic: Wound #1 Right Gluteus: (In clinic) Topical Lidocaine 5% applied to wound bed Bathing/ Shower/ Hygiene: May shower and wash wound with soap and water. - apply clean dressing after washing Off-Loading: Wound #1 Right Gluteus: Turn and reposition every 2 hours Home Health: Wound #1 Right Gluteus: No change in wound care orders this week; continue Home Health for wound care. May utilize formulary equivalent dressing for wound treatment orders unless otherwise specified. - change dressing to hydrofera blue to be changed 3xwk; please  ensure the hydrofera blue ready (if used) writing is side up. Dressing changes to be completed by Home Health on Monday / Wednesday / Friday except when patient has scheduled visit at [facility]. Other Home Health Orders/Instructions: - [facility] WOUND #1: - Gluteus Wound Laterality: Right Cleanser: Soap and Water 3 x Per Week/30 Days Discharge Instructions: May shower and wash wound with dial antibacterial soap and water prior to dressing change. Cleanser: Wound Cleanser 3 x Per Week/30 Days Discharge Instructions: Cleanse the wound with wound cleanser prior to applying a clean dressing using gauze sponges, not tissue or cotton balls. Peri-Wound Care: Skin Prep 3 x Per Week/30 Days Discharge Instructions: Use skin prep as directed Prim Dressing: Hydrofera Blue Ready Transfer Foam, 4x5 (in/in) 3 x Per Week/30 Days ary Discharge Instructions: Apply to wound bed as instructed Secondary Dressing: Zetuvit Plus Silicone Border Sacrum Dressing, Sm, 7x7 (in/in) 3 x Per Week/30 Days Discharge Instructions: Apply silicone border over primary dressing as directed. 1. Hydrofera Blue 2. Aggressive offloading 3. Follow-up in 2 weeks Electronic Signature(s) Signed: 04/19/2023 4:37:23 PM By: Geralyn Corwin DO Entered By: Geralyn Corwin on 04/19/2023 16:31:55 Nathaniel [name] ([id number]) [id number] Page 6 of 7 -------------------------------------------------------------------------------- HxROS Details Patient Name: Date of Service: Nathaniel [name], [name] 04/19/2023 3:00 PM Medical Record Number: [id number] Patient Account Number: [id number] Date of Birth/Sex: Treating RN: November 30, 1985 (37 y.o. M) Primary Care Provider: Avon Gully Other Clinician: Referring Provider: Treating Provider/Extender: Leeroy Bock Weeks in Treatment: 10 Information Obtained From Patient Constitutional Symptoms (General Health) Medical History: Past  Medical History Notes: Stroke 2020 Eyes Medical History: Past Medical History Notes: retinopathy left eye Cardiovascular Medical History: Positive for: Hypertension Past Medical History Notes: cardiac arrest 09/2022 Endocrine Medical History: Positive for: Type I Diabetes Negative for: Type II Diabetes Treated with: Insulin Blood sugar tested every day: Yes Tested : Genitourinary Medical History: Past Medical History Notes: CKD stage III Neurologic Medical History: Past Medical History Notes: CVA 2019 Immunizations Pneumococcal Vaccine: Received Pneumococcal Vaccination: No Implantable Devices None Family and Social History Cancer: Yes - Maternal Grandparents; Diabetes: Yes - Father,Maternal Grandparents,Paternal Grandparents; Hypertension: Yes - Mother; Kidney Disease: Yes - Father; Current every day smoker - 1/2 ppd; Marital Status - Single; Alcohol Use: Never; Drug Use: Current History - marijuana; Caffeine Use: Daily; Financial Concerns: No; Food, Clothing or Shelter Needs: No; Support System Lacking: No; Transportation Concerns: No Electronic Signature(s) Signed: 04/19/2023 4:37:23 PM By: Geralyn Corwin DO Entered By: Geralyn Corwin on 04/19/2023 16:30:25 Nathaniel [name] ([id number]) [id number] Page 7 of 7 --------------------------------------------------------------------------------  SuperBill Details Patient Name: Date of Service: Nathaniel [name] [place] LD K. 04/19/2023 Medical Record Number: [id number] Patient Account Number: [id number] Date of Birth/Sex: Treating RN: 1985-11-28 (37 y.o. [name]: Avon Gully Other Clinician: Referring Provider: Treating Provider/Extender: Leeroy Bock Weeks in Treatment: 10 Diagnosis Coding ICD-10 Codes Code Description [phone] Non-pressure chronic ulcer of skin of other sites with fat layer exposed S31.819A Unspecified open wound of right  buttock, initial encounter E10.622 Type 1 diabetes mellitus with other skin ulcer L02.91 Cutaneous abscess, unspecified F12.10 Cannabis abuse, uncomplicated Facility Procedures : CPT4 Code: [id number] Description: 99213 - WOUND CARE VISIT-LEV 3 EST PT Modifier: Quantity: 1 Physician Procedures : CPT4 Code Description Modifier [id number] 99213 - WC PHYS LEVEL 3 - EST PT ICD-10 Diagnosis Description L98.492 Non-pressure chronic ulcer of skin of other sites with fat layer exposed S31.819A Unspecified open wound of right buttock, initial encounter  E10.622 Type 1 diabetes mellitus with other skin ulcer Quantity: 1 Electronic Signature(s) Signed: 04/19/2023 4:37:23 PM By: Geralyn Corwin DO Entered By: Geralyn Corwin on 04/19/2023 16:32:08

## 2023-04-20 NOTE — Progress Notes (Signed)
FAIN, PINN ([id number]) [phone].pdf Page 1 of 9 Visit Report for 04/19/2023 Arrival Information Details Patient Name: Date of Service: Nathaniel [name], [name] 04/19/2023 3:00 PM Medical Record Number: [id number] Patient Account Number: [id number] Date of Birth/Sex: Treating RN: Mar 02, 1986 (37 y.o. M) Primary Care Macen Joslin: [place]on Gully Other Clinician: Referring Maclane Holloran: Treating Philena Obey/Extender: Leeroy Bock Weeks in Treatment: 10 Visit Information History Since Last Visit Added or deleted any medications: No Patient Arrived: Cane Any new allergies or adverse reactions: No Arrival Time: 15:18 Had a fall or experienced change in No Accompanied By: mother activities of daily living that may affect Transfer Assistance: None risk of falls: Patient Identification Verified: Yes Signs or symptoms of abuse/neglect since last visito No Secondary Verification Process Completed: Yes Hospitalized since last visit: No Patient Requires Transmission-Based Precautions: No Implantable device outside of the clinic excluding No Patient Has Alerts: No cellular tissue based products placed in the center since last visit: Has Dressing in Place as Prescribed: Yes Pain Present Now: No Electronic Signature(s) Signed: 04/19/2023 4:56:42 PM By: Thayer Dallas Entered By: Thayer Dallas on 04/19/2023 15:21:25 -------------------------------------------------------------------------------- Clinic Level of Care Assessment Details Patient Name: Date of Service: [facility], [name] 04/19/2023 3:00 PM Medical Record Number: [id number] Patient Account Number: [id number] Date of Birth/Sex: Treating RN: 04-26-1986 (37 y.o. Nathaniel [name] Primary Care Yitzel Shasteen: [place] Gully Other Clinician: Referring Loella Hickle: Treating Jeanett Antonopoulos/Extender: Leeroy Bock Weeks in Treatment: 10 Clinic Level of Care Assessment Items TOOL 4  Quantity Score X- 1 0 Use when only an EandM is performed on FOLLOW-UP visit ASSESSMENTS - Nursing Assessment / Reassessment X- 1 10 Reassessment of Co-morbidities (includes updates in patient status) X- 1 5 Reassessment of Adherence to Treatment Plan ASSESSMENTS - Wound and Skin A ssessment / Reassessment X - Simple Wound Assessment / Reassessment - one wound 1 5 []  - 0 Complex Wound Assessment / Reassessment - multiple wounds []  - 0 Dermatologic / Skin Assessment (not related to wound area) ASSESSMENTS - Focused Assessment []  - 0 Circumferential Edema Measurements - multi extremities []  - 0 Nutritional Assessment / Counseling / Intervention JYQUAN, HARDER ([id number]) [id number] Page 2 of 9 []  - 0 Lower Extremity Assessment (monofilament, tuning fork, pulses) []  - 0 Peripheral Arterial Disease Assessment (using hand held doppler) ASSESSMENTS - Ostomy and/or Continence Assessment and Care []  - 0 Incontinence Assessment and Management []  - 0 Ostomy Care Assessment and Management (repouching, etc.) PROCESS - Coordination of Care X - Simple Patient / Family Education for ongoing care 1 15 []  - 0 Complex (extensive) Patient / Family Education for ongoing care X- 1 10 Staff obtains Chiropractor, Records, T Results / Process Orders est X- 1 10 Staff telephones HHA, Nursing Homes / Clarify orders / etc []  - 0 Routine Transfer to another Facility (non-emergent condition) []  - 0 Routine Hospital Admission (non-emergent condition) []  - 0 New Admissions / Manufacturing engineer / Ordering NPWT Apligraf, etc. , []  - 0 Emergency Hospital Admission (emergent condition) X- 1 10 Simple Discharge Coordination []  - 0 Complex (extensive) Discharge Coordination PROCESS - Special Needs []  - 0 Pediatric / Minor Patient Management []  - 0 Isolation Patient Management []  - 0 Hearing / Language / Visual special needs []  - 0 Assessment of Community  assistance (transportation, D/C planning, etc.) []  - 0 Additional assistance / Altered mentation []  - 0 Support Surface(s) Assessment (bed, cushion, seat, etc.) INTERVENTIONS - Wound Cleansing / Measurement X - Simple  Wound Cleansing - one wound 1 5 []  - 0 Complex Wound Cleansing - multiple wounds X- 1 5 Wound Imaging (photographs - any number of wounds) []  - 0 Wound Tracing (instead of photographs) X- 1 5 Simple Wound Measurement - one wound []  - 0 Complex Wound Measurement - multiple wounds INTERVENTIONS - Wound Dressings X - Small Wound Dressing one or multiple wounds 1 10 []  - 0 Medium Wound Dressing one or multiple wounds []  - 0 Large Wound Dressing one or multiple wounds []  - 0 Application of Medications - topical []  - 0 Application of Medications - injection INTERVENTIONS - Miscellaneous []  - 0 External ear exam []  - 0 Specimen Collection (cultures, biopsies, blood, body fluids, etc.) []  - 0 Specimen(s) / Culture(s) sent or taken to Lab for analysis []  - 0 Patient Transfer (multiple staff / Nurse, adult / Similar devices) []  - 0 Simple Staple / Suture removal (25 or less) []  - 0 Complex Staple / Suture removal (26 or more) []  - 0 Hypo / Hyperglycemic Management (close monitor of Blood Glucose) RODARIUS, SCANNELL ([id number]) [phone].pdf Page 3 of 9 []  - 0 Ankle / Brachial Index (ABI) - do not check if billed separately X- 1 5 Vital Signs Has the patient been seen at the hospital within the last three years: Yes Total Score: 95 Level Of Care: New/Established - Level 3 Electronic Signature(s) Signed: 04/19/2023 6:21:10 PM By: Shawn Stall RN, BSN Entered By: Shawn Stall on 04/19/2023 15:48:22 -------------------------------------------------------------------------------- Encounter Discharge Information Details Patient Name: Date of Service: Nathaniel [name], [place] LD K. 04/19/2023 3:00 PM Medical Record Number: [id number] Patient  Account Number: [id number] Date of Birth/Sex: Treating RN: Feb 18, 1986 (37 y.o. Nathaniel [name] Primary Care Ardyth Kelso: [place]on Gully Other Clinician: Referring Mathis Cashman: Treating Kaeleen Odom/Extender: Leeroy Bock Weeks in Treatment: 10 Encounter Discharge Information Items Discharge Condition: Stable Ambulatory Status: Cane Discharge Destination: Home Transportation: Private Auto Accompanied By: mother Schedule Follow-up Appointment: Yes Clinical Summary of Care: Electronic Signature(s) Signed: 04/19/2023 6:21:10 PM By: Shawn Stall RN, BSN Entered By: Shawn Stall on 04/19/2023 15:49:23 -------------------------------------------------------------------------------- Lower Extremity Assessment Details Patient Name: Date of Service: Nathaniel [name], [place] LD K. 04/19/2023 3:00 PM Medical Record Number: [id number] Patient Account Number: [id number] Date of Birth/Sex: Treating RN: 1985/11/30 (37 y.o. M) Primary Care Jilene Spohr: Avon Gully Other Clinician: Referring Remona Boom: Treating Sianne Tejada/Extender: Abbott Pao, [name] Weeks in Treatment: 10 Electronic Signature(s) Signed: 04/19/2023 4:56:42 PM By: Thayer Dallas Entered By: Thayer Dallas on 04/19/2023 15:22:41 -------------------------------------------------------------------------------- Multi Wound Chart Details Patient Name: Date of Service: Nathaniel [name], [place] LD K. 04/19/2023 3:00 PM Medical Record Number: [id number] Patient Account Number: [id number] DRELON, PRINTY ([id number]) [phone].pdf Page 4 of 9 Date of Birth/Sex: Treating RN: 25-Jun-1986 (37 y.o. M) Primary Care Aniruddh Ciavarella: [place] Gully Other Clinician: Referring Alexus Michael: Treating Mekenzie Modeste/Extender: Abbott Pao, [name] Weeks in Treatment: 10 Vital Signs Height(in): 71 Pulse(bpm): 91 Weight(lbs): 149 Blood Pressure(mmHg): 99/62 Body Mass Index(BMI): 20.8 Temperature(F):  98.9 Respiratory Rate(breaths/min): 16 [1:Photos:] [N/A:N/A] Right Gluteus N/A N/A Wound Location: Gradually Appeared N/A N/A Wounding Event: Abscess N/A N/A Primary Etiology: Hypertension, Type I Diabetes N/A N/A Comorbid History: 09/12/2022 N/A N/A Date Acquired: 10 N/A N/A Weeks of Treatment: Open N/A N/A Wound Status: No N/A N/A Wound Recurrence: 0.5x1.5x0.1 N/A N/A Measurements L x W x D (cm) 0.589 N/A N/A A (cm) : rea 0.059 N/A N/A Volume (cm) : 98.90% N/A N/A % Reduction in A rea: 98.90% N/A N/A %  Reduction in Volume: Full Thickness Without Exposed N/A N/A Classification: Support Structures Medium N/A N/A Exudate Amount: Serosanguineous N/A N/A Exudate Type: red, brown N/A N/A Exudate Color: Distinct, outline attached N/A N/A Wound Margin: Large (67-100%) N/A N/A Granulation Amount: Red, Pink N/A N/A Granulation Quality: Small (1-33%) N/A N/A Necrotic Amount: Fat Layer (Subcutaneous Tissue): Yes N/A N/A Exposed Structures: Fascia: No Tendon: No Muscle: No Joint: No Bone: No Medium (34-66%) N/A N/A Epithelialization: Scarring: Yes N/A N/A Periwound Skin Texture: Excoriation: No Induration: No Callus: No Crepitus: No Rash: No Dry/Scaly: Yes N/A N/A Periwound Skin Moisture: Maceration: No Atrophie Blanche: No N/A N/A Periwound Skin Color: Cyanosis: No Ecchymosis: No Erythema: No Hemosiderin Staining: No Mottled: No Pallor: No Rubor: No No Abnormality N/A N/A Temperature: Treatment Notes Wound #1 (Gluteus) Wound Laterality: Right Cleanser Soap and Water Discharge Instruction: May shower and wash wound with dial antibacterial soap and water prior to dressing change. Wound Cleanser Discharge Instruction: Cleanse the wound with wound cleanser prior to applying a clean dressing using gauze sponges, not tissue or cotton balls. KAZUKI, RAYGOR ([id number]) [phone].pdf Page 5 of 9 Peri-Wound Care Skin  Prep Discharge Instruction: Use skin prep as directed Topical Primary Dressing Hydrofera Blue Ready Transfer Foam, 4x5 (in/in) Discharge Instruction: Apply to wound bed as instructed Secondary Dressing Zetuvit Plus Silicone Border Sacrum Dressing, Sm, 7x7 (in/in) Discharge Instruction: Apply silicone border over primary dressing as directed. Secured With Compression Wrap Compression Stockings Facilities manager) Signed: 04/19/2023 4:37:23 PM By: Geralyn Corwin DO Entered By: Geralyn Corwin on 04/19/2023 16:29:39 -------------------------------------------------------------------------------- Multi-Disciplinary Care Plan Details Patient Name: Date of Service: Nathaniel [name], [place] LD K. 04/19/2023 3:00 PM Medical Record Number: [id number] Patient Account Number: [id number] Date of Birth/Sex: Treating RN: 08-15-1986 (37 y.o. Nathaniel [name] Primary Care Taylan Marez: [place] Gully Other Clinician: Referring Yessenia Maillet: Treating Shalandria Elsbernd/Extender: Leeroy Bock Weeks in Treatment: 10 Active Inactive Nutrition Nursing Diagnoses: Impaired glucose control: actual or potential Goals: Patient/caregiver verbalizes understanding of need to maintain therapeutic glucose control per primary care physician Date Initiated: 02/08/2023 Target Resolution Date: 06/18/2023 Goal Status: Active Patient/caregiver will maintain therapeutic glucose control Date Initiated: 02/08/2023 Target Resolution Date: 06/18/2023 Goal Status: Active Interventions: Assess HgA1c results as ordered upon admission and as needed Assess patient nutrition upon admission and as needed per policy Provide education on elevated blood sugars and impact on wound healing Notes: Wound/Skin Impairment Nursing Diagnoses: Impaired tissue integrity Knowledge deficit related to smoking impact on wound healing Knowledge deficit related to ulceration/compromised skin integrity GoalsGIRARD, DOBIS  ([id number]) [id number] Page 6 of 9 Patient/caregiver will verbalize understanding of skin care regimen Date Initiated: 02/08/2023 Target Resolution Date: 06/18/2023 Goal Status: Active Ulcer/skin breakdown will have a volume reduction of 30% by week 4 Date Initiated: 02/08/2023 Target Resolution Date: 06/18/2023 Goal Status: Active Interventions: Assess patient/caregiver ability to obtain necessary supplies Assess patient/caregiver ability to perform ulcer/skin care regimen upon admission and as needed Assess ulceration(s) every visit Provide education on smoking Provide education on ulcer and skin care Notes: Electronic Signature(s) Signed: 04/19/2023 6:21:10 PM By: Shawn Stall RN, BSN Entered By: Shawn Stall on 04/19/2023 15:46:57 -------------------------------------------------------------------------------- Pain Assessment Details Patient Name: Date of Service: Nathaniel [name], [place] LD K. 04/19/2023 3:00 PM Medical Record Number: [id number] Patient Account Number: [id number] Date of Birth/Sex: Treating RN: [birth year] (37 y.o. M) Primary Care Markeise Mathews: [place] Gully Other Clinician: Referring Azalie Harbeck: Treating Makaria Poarch/Extender: Abbott Pao, [name] Weeks in Treatment: 10 Active Problems Location of Pain  Severity and Description of Pain Patient Has Paino No Site Locations Pain Management and Medication Current Pain Management: Electronic Signature(s) Signed: 04/19/2023 4:56:42 PM By: Thayer Dallas Entered By: Thayer Dallas on 04/19/2023 15:22:06 Sherrill Raring ([id number]) [id number] Page 7 of 9 -------------------------------------------------------------------------------- Patient/Caregiver Education Details Patient Name: Date of Service: [facility], [name] 7/30/2024andnbsp3:00 PM Medical Record Number: [id number] Patient Account Number: [id number] Date of Birth/Gender: Treating RN: [birth year]  (37 y.o. Nathaniel [name] Primary Care Physician: Avon Gully Other Clinician: Referring Physician: Treating Physician/Extender: Leeroy Bock Weeks in Treatment: 10 Education Assessment Education Provided To: Patient Education Topics Provided Wound/Skin Impairment: Handouts: Caring for Your Ulcer Methods: Explain/Verbal Responses: Reinforcements needed Electronic Signature(s) Signed: 04/19/2023 6:21:10 PM By: Shawn Stall RN, BSN Entered By: Shawn Stall on 04/19/2023 15:47:40 -------------------------------------------------------------------------------- Wound Assessment Details Patient Name: Date of Service: Nathaniel [name], [place] LD K. 04/19/2023 3:00 PM Medical Record Number: [id number] Patient Account Number: [id number] Date of Birth/Sex: Treating RN: 16-Feb-1986 (37 y.o. M) Primary Care Jaycee Pelzer: [place] Gully Other Clinician: Referring Dima Mini: Treating Johnnye Sandford/Extender: Abbott Pao, [name] Weeks in Treatment: 10 Wound Status Wound Number: 1 Primary Etiology: Abscess Wound Location: Right Gluteus Wound Status: Open Wounding Event: Gradually Appeared Comorbid History: Hypertension, Type I Diabetes Date Acquired: 09/12/2022 Weeks Of Treatment: 10 Clustered Wound: No Photos Wound Measurements Length: (cm) 0.5 DAMIAN, PROVANCE ([id number]) Width: (cm) 1 Depth: (cm) 0 Area: (cm) Volume: (cm) % Reduction in Area: 98.9% [id number] Page 8 of 9 .5 % Reduction in Volume: 98.9% .1 Epithelialization: Medium (34-66%) 0.589 Tunneling: No 0.059 Undermining: No Wound Description Classification: Full Thickness Without Exposed Support Structures Wound Margin: Distinct, outline attached Exudate Amount: Medium Exudate Type: Serosanguineous Exudate Color: red, brown Foul Odor After Cleansing: No Slough/Fibrino Yes Wound Bed Granulation Amount: Large (67-100%) Exposed Structure Granulation Quality: Red,  Pink Fascia Exposed: No Necrotic Amount: Small (1-33%) Fat Layer (Subcutaneous Tissue) Exposed: Yes Necrotic Quality: Adherent Slough Tendon Exposed: No Muscle Exposed: No Joint Exposed: No Bone Exposed: No Periwound Skin Texture Texture Color No Abnormalities Noted: No No Abnormalities Noted: Yes Callus: No Temperature / Pain Crepitus: No Temperature: No Abnormality Excoriation: No Induration: No Rash: No Scarring: Yes Moisture No Abnormalities Noted: No Dry / Scaly: Yes Maceration: No Treatment Notes Wound #1 (Gluteus) Wound Laterality: Right Cleanser Soap and Water Discharge Instruction: May shower and wash wound with dial antibacterial soap and water prior to dressing change. Wound Cleanser Discharge Instruction: Cleanse the wound with wound cleanser prior to applying a clean dressing using gauze sponges, not tissue or cotton balls. Peri-Wound Care Skin Prep Discharge Instruction: Use skin prep as directed Topical Primary Dressing Hydrofera Blue Ready Transfer Foam, 4x5 (in/in) Discharge Instruction: Apply to wound bed as instructed Secondary Dressing Zetuvit Plus Silicone Border Sacrum Dressing, Sm, 7x7 (in/in) Discharge Instruction: Apply silicone border over primary dressing as directed. Secured With Compression Wrap Compression Stockings Facilities manager) Signed: 04/19/2023 4:56:42 PM By: Thayer Dallas Entered By: Thayer Dallas on 04/19/2023 15:34:18 Sherrill Raring ([id number]) [id number] Page 9 of 9 -------------------------------------------------------------------------------- Vitals Details Patient Name: Date of Service: Nathaniel [name], [name] 04/19/2023 3:00 PM Medical Record Number: [id number] Patient Account Number: [id number] Date of Birth/Sex: Treating RN: 10-Apr-1986 (37 y.o. M) Primary Care Ambrea Hegler: [place] Gully Other Clinician: Referring Sharel Behne: Treating Lareta Bruneau/Extender: Abbott Pao, [name] Weeks in Treatment: 10 Vital Signs Time Taken: 15:21 Temperature (F): 98.9 Height (in): 71 Pulse (bpm): 91 Weight (lbs): 149 Respiratory Rate (breaths/min):  16 Body Mass Index (BMI): 20.8 Blood Pressure (mmHg): 99/62 Reference Range: 80 - 120 mg / dl Electronic Signature(s) Signed: 04/19/2023 4:56:42 PM By: Thayer Dallas Entered By: Thayer Dallas on 04/19/2023 15:21:48

## 2023-05-03 ENCOUNTER — Encounter (HOSPITAL_BASED_OUTPATIENT_CLINIC_OR_DEPARTMENT_OTHER): Payer: Medicare HMO | Attending: Internal Medicine | Admitting: Internal Medicine

## 2023-05-03 DIAGNOSIS — E10622 Type 1 diabetes mellitus with other skin ulcer: Secondary | ICD-10-CM | POA: Insufficient documentation

## 2023-05-03 DIAGNOSIS — Z794 Long term (current) use of insulin: Secondary | ICD-10-CM | POA: Diagnosis not present

## 2023-05-03 DIAGNOSIS — Z833 Family history of diabetes mellitus: Secondary | ICD-10-CM | POA: Insufficient documentation

## 2023-05-03 DIAGNOSIS — L98492 Non-pressure chronic ulcer of skin of other sites with fat layer exposed: Secondary | ICD-10-CM | POA: Insufficient documentation

## 2023-05-03 DIAGNOSIS — S31819A Unspecified open wound of right buttock, initial encounter: Secondary | ICD-10-CM | POA: Insufficient documentation

## 2023-05-03 NOTE — Progress Notes (Signed)
Nathaniel [name] ([id number]) [phone].pdf Page 1 of 7 Visit Report for 05/03/2023 Arrival Information Details Patient Name: Date of Service: Nathaniel [name], Nathaniel NA LD K. 05/03/2023 2:15 PM Medical Record Number: [id number] Patient Account Number: [id number] Date of Birth/Sex: Treating RN: [birth year] (37 y.o. Nathaniel [name] Primary Care : [facility] Other Clinician: Referring : Treating /Extender: Leeroy Bock Weeks in Treatment: 12 Visit Information History Since Last Visit Added or deleted any medications: No Patient Arrived: Nathaniel [name] Any new allergies or adverse reactions: No Arrival Time: 14:13 Had a fall or experienced change in No Accompanied By: mother activities of daily living that may affect Transfer Assistance: None risk of falls: Patient Identification Verified: Yes Signs or symptoms of abuse/neglect since last visito No Secondary Verification Process Completed: Yes Hospitalized since last visit: No Patient Requires Transmission-Based Precautions: No Implantable device outside of the clinic excluding No Patient Has Alerts: No cellular tissue based products placed in the center since last visit: Has Dressing in Place as Prescribed: Yes Pain Present Now: No Electronic Signature(s) Signed: 05/03/2023 4:28:55 PM By: Samuella Bruin Entered By: Samuella Bruin on 05/03/2023 14:13:36 -------------------------------------------------------------------------------- Clinic Level of Care Assessment Details Patient Name: Date of Service: [facility], Nathaniel NA LD K. 05/03/2023 2:15 PM Medical Record Number: [id number] Patient Account Number: [id number] Date of Birth/Sex: Treating RN: [birth year] (37 y.o. Nathaniel [name] Primary Care : [facility] Other Clinician: Referring : Treating /Extender: Leeroy Bock Weeks in Treatment: 12 Clinic Level of  Care Assessment Items TOOL 4 Quantity Score X- 1 0 Use when only an EandM is performed on FOLLOW-UP visit ASSESSMENTS - Nursing Assessment / Reassessment []  - 0 Reassessment of Co-morbidities (includes updates in patient status) X- 1 5 Reassessment of Adherence to Treatment Plan ASSESSMENTS - Wound and Skin A ssessment / Reassessment X - Simple Wound Assessment / Reassessment - one wound 1 5 []  - 0 Complex Wound Assessment / Reassessment - multiple wounds []  - 0 Dermatologic / Skin Assessment (not related to wound area) ASSESSMENTS - Focused Assessment []  - 0 Circumferential Edema Measurements - multi extremities []  - 0 Nutritional Assessment / Counseling / Intervention Nathaniel [name] ([id number]) [id number] Page 2 of 7 []  - 0 Lower Extremity Assessment (monofilament, tuning fork, pulses) []  - 0 Peripheral Arterial Disease Assessment (using hand held doppler) ASSESSMENTS - Ostomy and/or Continence Assessment and Care []  - 0 Incontinence Assessment and Management []  - 0 Ostomy Care Assessment and Management (repouching, etc.) PROCESS - Coordination of Care X - Simple Patient / Family Education for ongoing care 1 15 []  - 0 Complex (extensive) Patient / Family Education for ongoing care X- 1 10 Staff obtains Chiropractor, Records, T Results / Process Orders est []  - 0 Staff telephones HHA, Nursing Homes / Clarify orders / etc []  - 0 Routine Transfer to another Facility (non-emergent condition) []  - 0 Routine Hospital Admission (non-emergent condition) []  - 0 New Admissions / Manufacturing engineer / Ordering NPWT Apligraf, etc. , []  - 0 Emergency Hospital Admission (emergent condition) X- 1 10 Simple Discharge Coordination []  - 0 Complex (extensive) Discharge Coordination PROCESS - Special Needs []  - 0 Pediatric / Minor Patient Management []  - 0 Isolation Patient Management []  - 0 Hearing / Language / Visual special needs []  -  0 Assessment of Community assistance (transportation, D/C planning, etc.) []  - 0 Additional assistance / Altered mentation []  - 0 Support Surface(s) Assessment (bed, cushion, seat, etc.) INTERVENTIONS - Wound Cleansing / Measurement X -  Simple Wound Cleansing - one wound 1 5 []  - 0 Complex Wound Cleansing - multiple wounds X- 1 5 Wound Imaging (photographs - any number of wounds) []  - 0 Wound Tracing (instead of photographs) X- 1 5 Simple Wound Measurement - one wound []  - 0 Complex Wound Measurement - multiple wounds INTERVENTIONS - Wound Dressings []  - 0 Small Wound Dressing one or multiple wounds []  - 0 Medium Wound Dressing one or multiple wounds []  - 0 Large Wound Dressing one or multiple wounds []  - 0 Application of Medications - topical []  - 0 Application of Medications - injection INTERVENTIONS - Miscellaneous []  - 0 External ear exam []  - 0 Specimen Collection (cultures, biopsies, blood, body fluids, etc.) []  - 0 Specimen(s) / Culture(s) sent or taken to Lab for analysis []  - 0 Patient Transfer (multiple staff / Nurse, adult / Similar devices) []  - 0 Simple Staple / Suture removal (25 or less) []  - 0 Complex Staple / Suture removal (26 or more) []  - 0 Hypo / Hyperglycemic Management (close monitor of Blood Glucose) Nathaniel [name] ([id number]) [id number] Page 3 of 7 []  - 0 Ankle / Brachial Index (ABI) - do not check if billed separately X- 1 5 Vital Signs Has the patient been seen at the hospital within the last three years: Yes Total Score: 65 Level Of Care: New/Established - Level 2 Electronic Signature(s) Signed: 05/03/2023 4:28:55 PM By: Samuella Bruin Entered By: Samuella Bruin on 05/03/2023 14:38:02 -------------------------------------------------------------------------------- Encounter Discharge Information Details Patient Name: Date of Service: Nathaniel [name], Nathaniel NA LD K. 05/03/2023 2:15 PM Medical Record  Number: [id number] Patient Account Number: [id number] Date of Birth/Sex: Treating RN: 08-11-1986 (36 y.o. Nathaniel [name] Primary Care : [facility] Other Clinician: Referring : Treating /Extender: Leeroy Bock Weeks in Treatment: 12 Encounter Discharge Information Items Discharge Condition: Stable Ambulatory Status: Cane Discharge Destination: Home Transportation: Private Auto Accompanied By: mother Schedule Follow-up Appointment: No Clinical Summary of Care: Patient Declined Electronic Signature(s) Signed: 05/03/2023 4:28:55 PM By: Samuella Bruin Entered By: Samuella Bruin on 05/03/2023 14:38:44 -------------------------------------------------------------------------------- Lower Extremity Assessment Details Patient Name: Date of Service: Nathaniel [name], Nathaniel NA LD K. 05/03/2023 2:15 PM Medical Record Number: [id number] Patient Account Number: [id number] Date of Birth/Sex: Treating RN: 05/08/1986 (37 y.o. Nathaniel [name] Primary Care : [facility] Other Clinician: Referring : Treating /Extender: Leeroy Bock Weeks in Treatment: 12 Electronic Signature(s) Signed: 05/03/2023 4:28:55 PM By: Samuella Bruin Entered By: Samuella Bruin on 05/03/2023 14:15:09 -------------------------------------------------------------------------------- Multi Wound Chart Details Patient Name: Date of Service: Nathaniel [name], Nathaniel NA LD K. 05/03/2023 2:15 PM Medical Record Number: [id number] Patient Account Number: [id number] YAZN, ETUE ([id number]) [phone].pdf Page 4 of 7 Date of Birth/Sex: Treating RN: [birth year] (37 y.o. M) Primary Care : [facility] Other Clinician: Referring : Treating /Extender: Abbott Pao, [name] Weeks in Treatment: 12 Vital Signs Height(in): 71 Pulse(bpm): 90 Weight(lbs): 149 Blood  Pressure(mmHg): 124/73 Body Mass Index(BMI): 20.8 Temperature(F): 98.3 Respiratory Rate(breaths/min): 18 [1:Photos:] [N/A:N/A] Right Gluteus N/A N/A Wound Location: Gradually Appeared N/A N/A Wounding Event: Abscess N/A N/A Primary Etiology: Hypertension, Type I Diabetes N/A N/A Comorbid History: 09/12/2022 N/A N/A Date Acquired: 12 N/A N/A Weeks of Treatment: Healed - Epithelialized N/A N/A Wound Status: No N/A N/A Wound Recurrence: 0x0x0 N/A N/A Measurements L x W x D (cm) 0 N/A N/A A (cm) : rea 0 N/A N/A Volume (cm) : 100.00% N/A N/A % Reduction in A rea: 100.00% N/A  N/A % Reduction in Volume: Full Thickness Without Exposed N/A N/A Classification: Support Structures None Present N/A N/A Exudate Amount: Distinct, outline attached N/A N/A Wound Margin: None Present (0%) N/A N/A Granulation Amount: None Present (0%) N/A N/A Necrotic Amount: Fascia: No N/A N/A Exposed Structures: Fat Layer (Subcutaneous Tissue): No Tendon: No Muscle: No Joint: No Bone: No Large (67-100%) N/A N/A Epithelialization: Scarring: Yes N/A N/A Periwound Skin Texture: Excoriation: No Induration: No Callus: No Crepitus: No Rash: No Dry/Scaly: Yes N/A N/A Periwound Skin Moisture: Maceration: No Atrophie Blanche: No N/A N/A Periwound Skin Color: Cyanosis: No Ecchymosis: No Erythema: No Hemosiderin Staining: No Mottled: No Pallor: No Rubor: No No Abnormality N/A N/A Temperature: Treatment Notes Electronic Signature(s) Signed: 05/03/2023 4:37:08 PM By: Geralyn Corwin DO Entered By: Geralyn Corwin on 05/03/2023 14:44:53 Cua, Arther Abbott ([id number]) [id number] Page 5 of 7 -------------------------------------------------------------------------------- Multi-Disciplinary Care Plan Details Patient Name: Date of Service: Nathaniel [name], Nathaniel NA LD K. 05/03/2023 2:15 PM Medical Record Number: [id number] Patient Account Number: [id number] Date  of Birth/Sex: Treating RN: 1985/10/24 (37 y.o. Nathaniel [name] Primary Care : [facility] Other Clinician: Referring : Treating /Extender: Leeroy Bock Weeks in Treatment: 12 Active Inactive Electronic Signature(s) Signed: 05/03/2023 4:28:55 PM By: Samuella Bruin Entered By: Samuella Bruin on 05/03/2023 14:37:37 -------------------------------------------------------------------------------- Pain Assessment Details Patient Name: Date of Service: Nathaniel [name], Nathaniel NA LD K. 05/03/2023 2:15 PM Medical Record Number: [id number] Patient Account Number: [id number] Date of Birth/Sex: Treating RN: [birth year] (37 y.o. Nathaniel [name] Primary Care : [facility] Other Clinician: Referring : Treating /Extender: Leeroy Bock Weeks in Treatment: 12 Active Problems Location of Pain Severity and Description of Pain Patient Has Paino No Site Locations Rate the pain. Current Pain Level: 0 Pain Management and Medication Current Pain Management: Electronic Signature(s) Signed: 05/03/2023 4:28:55 PM By: Samuella Bruin Entered By: Samuella Bruin on 05/03/2023 14:15:03 Sherrill Raring ([id number]) [id number] Page 6 of 7 -------------------------------------------------------------------------------- Patient/Caregiver Education Details Patient Name: Date of Service: Johnney Killian [place] LD K. 8/13/2024andnbsp2:15 PM Medical Record Number: [id number] Patient Account Number: [id number] Date of Birth/Gender: Treating RN: 07/22/1986 (37 y.o. Nathaniel [name] Primary Care Physician: [facility] Other Clinician: Referring Physician: Treating Physician/Extender: Leeroy Bock Weeks in Treatment: 12 Education Assessment Education Provided To: Patient Education Topics Provided Safety: Methods: Explain/Verbal Responses:  Reinforcements needed, State content correctly Electronic Signature(s) Signed: 05/03/2023 4:28:55 PM By: Samuella Bruin Entered By: Samuella Bruin on 05/03/2023 14:20:52 -------------------------------------------------------------------------------- Wound Assessment Details Patient Name: Date of Service: Nathaniel [name], Nathaniel NA LD K. 05/03/2023 2:15 PM Medical Record Number: [id number] Patient Account Number: [id number] Date of Birth/Sex: Treating RN: [birth year] (37 y.o. Nathaniel [name] Primary Care : [facility] Other Clinician: Referring : Treating /Extender: Leeroy Bock Weeks in Treatment: 12 Wound Status Wound Number: 1 Primary Etiology: Abscess Wound Location: Right Gluteus Wound Status: Healed - Epithelialized Wounding Event: Gradually Appeared Comorbid History: Hypertension, Type I Diabetes Date Acquired: 09/12/2022 Weeks Of Treatment: 12 Clustered Wound: No Photos Wound Measurements Length: (cm) 0 Width: (cm) 0 Whinery, Mickal K ([id number]) Depth: (cm) 0 Area: (cm) 0 Volume: (cm) 0 % Reduction in Area: 100% % Reduction in Volume: 100% [id number] Page 7 of 7 Epithelialization: Large (67-100%) Tunneling: No Undermining: No Wound Description Classification: Full Thickness Without Exposed Support Structures Wound Margin: Distinct, outline attached Exudate Amount: None Present Foul Odor After Cleansing: No Slough/Fibrino No Wound Bed Granulation Amount: None Present (0%) Exposed Structure Necrotic  Amount: None Present (0%) Fascia Exposed: No Fat Layer (Subcutaneous Tissue) Exposed: No Tendon Exposed: No Muscle Exposed: No Joint Exposed: No Bone Exposed: No Periwound Skin Texture Texture Color No Abnormalities Noted: No No Abnormalities Noted: Yes Callus: No Temperature / Pain Crepitus: No Temperature: No Abnormality Excoriation: No Induration: No Rash: No Scarring:  Yes Moisture No Abnormalities Noted: No Dry / Scaly: Yes Maceration: No Electronic Signature(s) Signed: 05/03/2023 4:28:55 PM By: Samuella Bruin Entered By: Samuella Bruin on 05/03/2023 14:31:55 -------------------------------------------------------------------------------- Vitals Details Patient Name: Date of Service: Nathaniel [name], Nathaniel NA LD K. 05/03/2023 2:15 PM Medical Record Number: [id number] Patient Account Number: [id number] Date of Birth/Sex: Treating RN: [birth year] (37 y.o. Nathaniel [name] Primary Care : [facility] Other Clinician: Referring : Treating /Extender: Leeroy Bock Weeks in Treatment: 12 Vital Signs Time Taken: 14:14 Temperature (F): 98.3 Height (in): 71 Pulse (bpm): 90 Weight (lbs): 149 Respiratory Rate (breaths/min): 18 Body Mass Index (BMI): 20.8 Blood Pressure (mmHg): 124/73 Reference Range: 80 - 120 mg / dl Electronic Signature(s) Signed: 05/03/2023 4:28:55 PM By: Samuella Bruin Entered By: Samuella Bruin on 05/03/2023 14:14:53

## 2023-05-03 NOTE — Progress Notes (Signed)
AUGUSTER, EFTINK ([id number]) [id number] Page 1 of 7 Visit Report for 05/03/2023 Chief Complaint Document Details Patient Name: Date of Service: Nathaniel Nathaniel, Nathaniel NA LD K. 05/03/2023 2:15 PM Medical Record Number: [id number] Patient Account Number: [id number] Date of Birth/Sex: Treating RN: July 23, 1986 (37 y.o. M) Primary Care Provider: Avon Nathaniel Other Clinician: Referring Provider: Treating Provider/Extender: Nathaniel Nathaniel in Treatment: 12 Information Obtained from: Patient Chief Complaint 02/08/2023; right buttocks wound Electronic Signature(s) Signed: 05/03/2023 4:37:08 PM By: Nathaniel Corwin DO Entered By: Nathaniel Nathaniel on 05/03/2023 14:45:13 -------------------------------------------------------------------------------- HPI Details Patient Name: Date of Service: Nathaniel Nathaniel, RO NA LD K. 05/03/2023 2:15 PM Medical Record Number: [id number] Patient Account Number: [id number] Date of Birth/Sex: Treating RN: 07-Oct-1985 (37 y.o. M) Primary Care Provider: Avon Nathaniel Other Clinician: Referring Provider: Treating Provider/Extender: Nathaniel Nathaniel in Treatment: 12 History of Present Illness HPI Description: 02/08/2023 Nathaniel Nathaniel is a 37 year old male with a past medical history of uncontrolled type 1 diabetes on insulin, CVA and neurosyphilis that presents the clinic for a 6-month history of right buttocks wound secondary to necrotizing soft tissue infection. On 09/12/2022 he was admitted to the hospital for necrotizing soft tissue infection of the right buttocks and had OR debridement with a wound VAC and given IV antibiotics. His admission was complicated by diabetic ketoacidosis and required ICU care with intubation. He was subsequently discharged to a skilled nursing facility. Unfortunately he was readmitted to the hospital on 10/18/2022 with cardiac arrest and found to be in septic shock.  This was thought to be due to hyperkalemia from DKA. At that time the wound was noted to be well-healing with no obvious signs of infection. For the past several months he has had home health changing the dressing 3 times weekly with silver alginate. He currently denies signs of infection. 5/30; patient presents for follow-up. He has been using Hydrofera Blue to the wound bed being changed 3 times weekly with home health. He has no issues or complaints today. Wound is smaller. 6/11; patient presents for follow-up. He has been using Hydrofera Blue to the wound bed. He has no issues or complaints today. Wound is again smaller. 6/25; patient presents for follow-up. He has been using Hydrofera Blue to the wound bed. Wound is smaller. 7/16; patient presents for follow-up. He has been using Hydrofera Blue to the wound bed. Wound is smaller. 7/30; patient presents for follow-up. He has been using Hydrofera Blue to the wound bed. Wound is smaller. It is almost healed. 8/13; patient presents for follow-up. He has been using Hydrofera Blue to the wound bed. His wound is healed. Electronic Signature(s) Signed: 05/03/2023 4:37:08 PM By: Nathaniel Corwin DO Entered By: Nathaniel Nathaniel on 05/03/2023 14:45:39 Nathaniel Nathaniel ([id number]) [id number] Page 2 of 7 -------------------------------------------------------------------------------- Physical Exam Details Patient Name: Date of Service: Nathaniel Nathaniel, Nathaniel NA LD K. 05/03/2023 2:15 PM Medical Record Number: [id number] Patient Account Number: [id number] Date of Birth/Sex: Treating RN: [birth year] (37 y.o. M) Primary Care Provider: Avon Nathaniel Other Clinician: Referring Provider: Treating Provider/Extender: Nathaniel Nathaniel, Nathaniel Nathaniel Nathaniel in Treatment: 12 Constitutional respirations regular, non-labored and within target range for patient.Marland Kitchen Psychiatric pleasant and cooperative. Notes T the lateral aspect over the  right buttocks there is epithelization to the previous wound site along with a scab. No fluctuance of the scab no drainage noted. o There are no signs of infection. Electronic Signature(s) Signed: 05/03/2023 4:37:08 PM By: Nathaniel Corwin DO Entered By: Nathaniel Nathaniel  on 05/03/2023 14:46:21 -------------------------------------------------------------------------------- Physician Orders Details Patient Name: Date of Service: Nathaniel Nathaniel, Nathaniel NA LD K. 05/03/2023 2:15 PM Medical Record Number: [id number] Patient Account Number: [id number] Date of Birth/Sex: Treating RN: [birth year] (37 y.o. Nathaniel Nathaniel Primary Care Provider: Avon Nathaniel Other Clinician: Referring Provider: Treating Provider/Extender: Nathaniel Nathaniel in Treatment: 12 Verbal / Phone Orders: No Diagnosis Coding Discharge From Nathaniel Nathaniel Services Discharge from Wound Care Nathaniel - Congratulations!!!!! Anesthetic (In clinic) Topical Lidocaine 4% applied to wound bed Additional Orders / Instructions Other: - moisturize the area with Vaseline Patient Medications llergies: fructose A Notifications Medication Indication Start End 05/03/2023 lidocaine DOSE topical 4 % cream - cream topical Electronic Signature(s) Signed: 05/03/2023 4:37:08 PM By: Nathaniel Corwin DO Entered By: Nathaniel Nathaniel on 05/03/2023 14:46:38 Nathaniel Nathaniel, Nathaniel Nathaniel ([id number]) [id number] Page 3 of 7 -------------------------------------------------------------------------------- Problem List Details Patient Name: Date of Service: Nathaniel Nathaniel, Nathaniel NA LD K. 05/03/2023 2:15 PM Medical Record Number: [id number] Patient Account Number: [id number] Date of Birth/Sex: Treating RN: Oct 02, 1985 (37 y.o. M) Primary Care Provider: Avon Nathaniel Other Clinician: Referring Provider: Treating Provider/Extender: Nathaniel Nathaniel in Treatment: 12 Active Problems ICD-10 Encounter Code  Description Active Date MDM Diagnosis L98.492 Non-pressure chronic ulcer of skin of other sites with fat layer exposed 02/08/2023 No Yes S31.819A Unspecified open wound of right buttock, initial encounter 02/08/2023 No Yes E10.622 Type 1 diabetes mellitus with other skin ulcer 02/08/2023 No Yes L02.91 Cutaneous abscess, unspecified 02/08/2023 No Yes F12.10 Cannabis abuse, uncomplicated 02/08/2023 No Yes Inactive Problems Resolved Problems Electronic Signature(s) Signed: 05/03/2023 4:37:08 PM By: Nathaniel Corwin DO Entered By: Nathaniel Nathaniel on 05/03/2023 14:44:45 -------------------------------------------------------------------------------- Progress Note Details Patient Name: Date of Service: Nathaniel Nathaniel, RO NA LD K. 05/03/2023 2:15 PM Medical Record Number: [id number] Patient Account Number: [id number] Date of Birth/Sex: Treating RN: 1986-07-16 (38 y.o. M) Primary Care Provider: Avon Nathaniel Other Clinician: Referring Provider: Treating Provider/Extender: Nathaniel Nathaniel in Treatment: 12 Subjective Chief Complaint Information obtained from Patient 02/08/2023; right buttocks wound History of Present Illness (HPI) 02/08/2023 Nathaniel Nathaniel, Nathaniel Nathaniel ([id number]) [id number] Page 4 of 7 Nathaniel Nathaniel is a 37 year old male with a past medical history of uncontrolled type 1 diabetes on insulin, CVA and neurosyphilis that presents the clinic for a 44-month history of right buttocks wound secondary to necrotizing soft tissue infection. On 09/12/2022 he was admitted to the hospital for necrotizing soft tissue infection of the right buttocks and had OR debridement with a wound VAC and given IV antibiotics. His admission was complicated by diabetic ketoacidosis and required ICU care with intubation. He was subsequently discharged to a skilled nursing facility. Unfortunately he was readmitted to the hospital on 10/18/2022 with cardiac arrest  and found to be in septic shock. This was thought to be due to hyperkalemia from DKA. At that time the wound was noted to be well-healing with no obvious signs of infection. For the past several months he has had home health changing the dressing 3 times weekly with silver alginate. He currently denies signs of infection. 5/30; patient presents for follow-up. He has been using Hydrofera Blue to the wound bed being changed 3 times weekly with home health. He has no issues or complaints today. Wound is smaller. 6/11; patient presents for follow-up. He has been using Hydrofera Blue to the wound bed. He has no issues or complaints today. Wound is again smaller. 6/25; patient presents for follow-up. He has been using Hydrofera Blue to  the wound bed. Wound is smaller. 7/16; patient presents for follow-up. He has been using Hydrofera Blue to the wound bed. Wound is smaller. 7/30; patient presents for follow-up. He has been using Hydrofera Blue to the wound bed. Wound is smaller. It is almost healed. 8/13; patient presents for follow-up. He has been using Hydrofera Blue to the wound bed. His wound is healed. Patient History Information obtained from Patient. Family History Cancer - Maternal Grandparents, Diabetes - Father,Maternal Grandparents,Paternal Grandparents, Hypertension - Mother, Kidney Disease - Father. Social History Current every day smoker - 1/2 ppd, Marital Status - Single, Alcohol Use - Never, Drug Use - Current History - marijuana, Caffeine Use - Daily. Medical History Cardiovascular Patient has history of Hypertension Endocrine Patient has history of Type I Diabetes Denies history of Type II Diabetes Medical A Surgical History Notes nd Constitutional Symptoms (General Health) Stroke 2020 Eyes retinopathy left eye Cardiovascular cardiac arrest 09/2022 Genitourinary CKD stage III Neurologic CVA 2019 Objective Constitutional respirations regular, non-labored and within target  range for patient.. Vitals Time Taken: 2:14 PM, Height: 71 in, Weight: 149 lbs, BMI: 20.8, Temperature: 98.3 F, Pulse: 90 bpm, Respiratory Rate: 18 breaths/min, Blood Pressure: 124/73 mmHg. Psychiatric pleasant and cooperative. General Notes: T the lateral aspect over the right buttocks there is epithelization to the previous wound site along with a scab. No fluctuance of the scab no o drainage noted. There are no signs of infection. Integumentary (Hair, Skin) Wound #1 status is Healed - Epithelialized. Original cause of wound was Gradually Appeared. The date acquired was: 09/12/2022. The wound has been in treatment 12 Nathaniel. The wound is located on the Right Gluteus. The wound measures 0cm length x 0cm width x 0cm depth; 0cm^2 area and 0cm^3 volume. There is no tunneling or undermining noted. There is a none present amount of drainage noted. The wound margin is distinct with the outline attached to the wound base. There is no granulation within the wound bed. There is no necrotic tissue within the wound bed. The periwound skin appearance had no abnormalities noted for color. The periwound skin appearance exhibited: Scarring, Dry/Scaly. The periwound skin appearance did not exhibit: Callus, Crepitus, Excoriation, Induration, Rash, Maceration. Periwound temperature was noted as No Abnormality. Assessment Nathaniel Nathaniel, Nathaniel Nathaniel ([id number]) [id number] Page 5 of 7 Active Problems ICD-10 Non-pressure chronic ulcer of skin of other sites with fat layer exposed Unspecified open wound of right buttock, initial encounter Type 1 diabetes mellitus with other skin ulcer Cutaneous abscess, unspecified Cannabis abuse, uncomplicated Patient has done well with Hydrofera Blue. The wound has healed. There is a scab over this and I recommended Vaseline nightly to help this come off. There are no signs of infection. Continue offloading for the next 1 to 2 Nathaniel to allow for the wound  to continue to heal.. Follow-up as needed. Plan Discharge From [facility] Services: Discharge from Wound Care Nathaniel - Congratulations!!!!! Anesthetic: (In clinic) Topical Lidocaine 4% applied to wound bed Additional Orders / Instructions: Other: - moisturize the area with Vaseline The following medication(s) was prescribed: lidocaine topical 4 % cream cream topical was prescribed at facility 1. Follow-up as needed 2. Discharge from clinic due to closed wound Electronic Signature(s) Signed: 05/03/2023 4:37:08 PM By: Nathaniel Corwin DO Entered By: Nathaniel Nathaniel on 05/03/2023 14:48:10 -------------------------------------------------------------------------------- HxROS Details Patient Name: Date of Service: Nathaniel Nathaniel, RO NA LD K. 05/03/2023 2:15 PM Medical Record Number: [id number] Patient Account Number: [id number] Date of Birth/Sex: Treating RN: [birth year] (37 y.o. M) Primary Care  Provider: Avon Nathaniel Other Clinician: Referring Provider: Treating Provider/Extender: Nathaniel Nathaniel in Treatment: 12 Information Obtained From Patient Constitutional Symptoms (General Health) Medical History: Past Medical History Notes: Stroke 2020 Eyes Medical History: Past Medical History Notes: retinopathy left eye Cardiovascular Medical History: Positive for: Hypertension Past Medical History Notes: cardiac arrest 09/2022 Endocrine Medical History: Positive for: Type I Diabetes Nathaniel Nathaniel, Nathaniel Nathaniel ([id number]) [id number] Page 6 of 7 Negative for: Type II Diabetes Treated with: Insulin Blood sugar tested every day: Yes Tested : Genitourinary Medical History: Past Medical History Notes: CKD stage III Neurologic Medical History: Past Medical History Notes: CVA 2019 Immunizations Pneumococcal Vaccine: Received Pneumococcal Vaccination: No Implantable Devices None Family and Social History Cancer: Yes - Maternal Grandparents;  Diabetes: Yes - Father,Maternal Grandparents,Paternal Grandparents; Hypertension: Yes - Mother; Kidney Disease: Yes - Father; Current every day smoker - 1/2 ppd; Marital Status - Single; Alcohol Use: Never; Drug Use: Current History - marijuana; Caffeine Use: Daily; Financial Concerns: No; Food, Clothing or Shelter Needs: No; Support System Lacking: No; Transportation Concerns: No Electronic Signature(s) Signed: 05/03/2023 4:37:08 PM By: Nathaniel Corwin DO Entered By: Nathaniel Nathaniel on 05/03/2023 14:45:46 -------------------------------------------------------------------------------- SuperBill Details Patient Name: Date of Service: Nathaniel Nathaniel, RO NA LD K. 05/03/2023 Medical Record Number: [id number] Patient Account Number: [id number] Date of Birth/Sex: Treating RN: 02-May-1986 (37 y.o. Nathaniel Nathaniel Primary Care Provider: Avon Nathaniel Other Clinician: Referring Provider: Treating Provider/Extender: Nathaniel Nathaniel in Treatment: 12 Diagnosis Coding ICD-10 Codes Code Description [phone] Non-pressure chronic ulcer of skin of other sites with fat layer exposed S31.819A Unspecified open wound of right buttock, initial encounter E10.622 Type 1 diabetes mellitus with other skin ulcer L02.91 Cutaneous abscess, unspecified F12.10 Cannabis abuse, uncomplicated Facility Procedures : CPT4 Code: [id number] Description: 84166 - WOUND CARE VISIT-LEV 2 EST PT Modifier: Quantity: 1 Physician Procedures : CPT4 Code Description Modifier [id number] - WC PHYS LEVEL 3 - EST PT ICD-10 Diagnosis Description L98.492 Non-pressure chronic ulcer of skin of other sites with fat layer exposed S31.819A Unspecified open wound of right buttock, initial encounter  E10.622 Type 1 diabetes mellitus with other skin ulcer EBON, Nathaniel Nathaniel ([id number]) [id number] Pag Quantity: 1 e 7 of 7 Electronic Signature(s) Signed: 05/03/2023 4:37:08 PM By: Nathaniel Corwin DO Entered By: Nathaniel Nathaniel on 05/03/2023 14:48:53

## 2023-05-09 ENCOUNTER — Telehealth: Payer: Self-pay | Admitting: Endocrinology

## 2023-05-09 NOTE — Telephone Encounter (Signed)
Patient's mother, Vella Redhead, called to say that patient is wanting to transfer to to [facility].

## 2023-05-30 LAB — BASIC METABOLIC PANEL
BUN: 26 — AB (ref 4–21)
Creatinine: 3 — AB (ref 0.6–1.3)
Potassium: 5.5 meq/L — AB (ref 3.5–5.1)

## 2023-05-30 LAB — MICROALBUMIN / CREATININE URINE RATIO: Microalb Creat Ratio: 556

## 2023-05-30 LAB — COMPREHENSIVE METABOLIC PANEL: eGFR: 27

## 2023-07-18 NOTE — Patient Instructions (Signed)

## 2023-07-19 ENCOUNTER — Other Ambulatory Visit: Payer: Self-pay | Admitting: Nurse Practitioner

## 2023-07-19 ENCOUNTER — Encounter: Payer: Self-pay | Admitting: Nurse Practitioner

## 2023-07-19 ENCOUNTER — Ambulatory Visit (INDEPENDENT_AMBULATORY_CARE_PROVIDER_SITE_OTHER): Payer: Medicare HMO | Admitting: Nurse Practitioner

## 2023-07-19 VITALS — BP 142/84 | HR 92 | Ht 71.0 in | Wt 148.6 lb

## 2023-07-19 DIAGNOSIS — I10 Essential (primary) hypertension: Secondary | ICD-10-CM

## 2023-07-19 DIAGNOSIS — E1022 Type 1 diabetes mellitus with diabetic chronic kidney disease: Secondary | ICD-10-CM | POA: Diagnosis not present

## 2023-07-19 DIAGNOSIS — N184 Chronic kidney disease, stage 4 (severe): Secondary | ICD-10-CM | POA: Diagnosis not present

## 2023-07-19 DIAGNOSIS — E7800 Pure hypercholesterolemia, unspecified: Secondary | ICD-10-CM | POA: Diagnosis not present

## 2023-07-19 DIAGNOSIS — E1065 Type 1 diabetes mellitus with hyperglycemia: Secondary | ICD-10-CM | POA: Diagnosis not present

## 2023-07-19 LAB — POCT GLYCOSYLATED HEMOGLOBIN (HGB A1C): Hemoglobin A1C: 6.9 % — AB (ref 4.0–5.6)

## 2023-07-19 MED ORDER — TRESIBA FLEXTOUCH 100 UNIT/ML ~~LOC~~ SOPN: 16.0000 [IU] | PEN_INJECTOR | Freq: Every day | SUBCUTANEOUS | 3 refills | Status: DC

## 2023-07-19 MED ORDER — OMNIPOD 5 DEXG7G6 PODS GEN 5 MISC: 6 refills | Status: DC

## 2023-07-19 MED ORDER — OMNIPOD 5 DEXG7G6 INTRO GEN 5 KIT: PACK | 0 refills | Status: AC

## 2023-07-19 MED ORDER — NOVOLOG FLEXPEN 100 UNIT/ML ~~LOC~~ SOPN
2.0000 [IU] | PEN_INJECTOR | Freq: Three times a day (TID) | SUBCUTANEOUS | 3 refills | Status: DC
Start: 2023-07-19 — End: 2023-11-15

## 2023-07-19 NOTE — Progress Notes (Unsigned)
Endocrinology Consult Note       07/20/2023, 7:52 AM   Subjective:    Patient ID: Nathaniel [name], male    DOB: 11/22/1985.  Nathaniel [name] is being seen in consultation for management of currently uncontrolled symptomatic diabetes requested by  Benetta Spar, MD.   Past Medical History:  Diagnosis Date   Diabetes mellitus    lantus/novolog   Hyperlipidemia    Hypertension    Marijuana abuse    occaisionally   Noncompliance with medication regimen    Stroke (HCC) 2020   Tobacco abuse    5/day    Past Surgical History:  Procedure Laterality Date   AV FISTULA PLACEMENT Left 01/08/2021   Procedure: LEFT ARM ARTERIOVENOUS (AV) FISTULA CREATION;  Surgeon: Larina Earthly, MD;  Location: [facility];  Service: Vascular;  Laterality: Left;   EYE SURGERY      Social History   Socioeconomic History   Marital status: Single    Spouse name: Not on file   Number of children: Not on file   Years of education: Not on file   Highest education level: Not on file  Occupational History   Occupation: disabled  Tobacco Use   Smoking status: Every Day    Current packs/day: 0.50    Average packs/day: 0.5 packs/day for 8.0 years (4.0 ttl pk-yrs)    Types: Cigarettes   Smokeless tobacco: Never   Tobacco comments:    smoke 5-8 per day  Vaping Use   Vaping status: Never Used  Substance and Sexual Activity   Alcohol use: Never   Drug use: Yes    Types: Marijuana    Comment: last use yesterday, every other days    Sexual activity: Yes    Birth control/protection: Condom  Other Topics Concern   Not on file  Social History Narrative   Not on file   Social Determinants of Health   Financial Resource Strain: Low Risk  (01/17/2023)   Overall Financial Resource Strain (CARDIA)    Difficulty of Paying Living Expenses: Not hard at all  Food Insecurity: No Food Insecurity (01/17/2023)   Hunger Vital Sign     Worried About Running Out of Food in the Last Year: Never true    Ran Out of Food in the Last Year: Never true  Transportation Needs: No Transportation Needs (01/17/2023)   PRAPARE - Administrator, Civil Service (Medical): No    Lack of Transportation (Non-Medical): No  Physical Activity: Not on file  Stress: Not on file  Social Connections: Not on file    Family History  Problem Relation Age of Onset   Diabetes Father    Kidney failure Father        last 4 mnths of life   Hypertension Mother    Diabetes Maternal Grandmother    Pancreatic cancer Maternal Grandfather    Diabetes Paternal Grandfather     Outpatient Encounter Medications as of 07/19/2023  Medication Sig   acetaminophen (TYLENOL) 325 MG tablet Take 1-2 tablets (325-650 mg total) by mouth every 4 (four) hours as needed for mild pain.   amLODipine (NORVASC) 10 MG tablet Take 1 tablet (10  mg total) by mouth daily. For blood pressure   ASPIRIN LOW DOSE 81 MG EC tablet Take 81 mg by mouth every morning.   atorvastatin (LIPITOR) 80 MG tablet Take 1 tablet (80 mg total) by mouth every evening. For stroke prevention   clopidogrel (PLAVIX) 75 MG tablet Take 1 tablet (75 mg total) by mouth daily. For stroke prevention   Continuous Blood Gluc Sensor (DEXCOM G6 SENSOR) MISC 1 each by Does not apply route every 14 (fourteen) days.   Continuous Blood Gluc Transmit (DEXCOM G6 TRANSMITTER) MISC Check sugar 4 times a day   ferrous sulfate 325 (65 FE) MG tablet Take 325 mg by mouth daily with breakfast.   glucose blood (ONETOUCH ULTRA) test strip USE TO TEST BLOOD SUGAR 4 TIMES A DAY   Insulin Disposable Pump (OMNIPOD 5 DEXG7G6 INTRO GEN 5) KIT Change pod every 72 hours   Insulin Disposable Pump (OMNIPOD 5 DEXG7G6 PODS GEN 5) MISC Change pod every 72 hours   labetalol (NORMODYNE) 100 MG tablet Take 1 tablet (100 mg total) by mouth 2 (two) times daily. For BP   Multiple Vitamin (MULTIVITAMIN) tablet Take 1 tablet by mouth  daily.   Nutritional Supplements (GLUCERNA 1.5 CAL PO) Take 237 mLs by mouth daily.   sodium zirconium cyclosilicate (LOKELMA) 10 g PACK packet Take 10 g by mouth daily.   Vitamin D, Ergocalciferol, (DRISDOL) 1.25 MG (50000 UNIT) CAPS capsule Take 1 capsule (50,000 Units total) by mouth once a week. (Patient taking differently: Take 50,000 Units by mouth every Friday.)   [DISCONTINUED] insulin aspart (NOVOLOG FLEXPEN) 100 UNIT/ML FlexPen INJECT PER SLIDING SCALE BEFORE MEALS: CBG 70 - 120: 0 UNITS CBG 121 - 150: 1 UNIT CBG 151 - 200: 2 UNITS CBG 201 - 250: 3 UNITS CBG 251 - 300: 5 UNITS CBG 301 - 350: 7 UNITS CBG 351 - 400: 9 UNITS CBG >400: 9 UNITS AND CALL DOCTOR   [DISCONTINUED] insulin degludec (TRESIBA FLEXTOUCH) 100 UNIT/ML FlexTouch Pen INJECT 28 UNITS INTO THE SKIN DAILY   insulin aspart (NOVOLOG FLEXPEN) 100 UNIT/ML FlexPen Inject 2-5 Units into the skin 3 (three) times daily with meals.   insulin degludec (TRESIBA FLEXTOUCH) 100 UNIT/ML FlexTouch Pen Inject 16 Units into the skin at bedtime.   Insulin Pen Needle (PEN NEEDLES 3/16") 31G X 5 MM MISC Four times day (Patient not taking: Reported on 07/19/2023)   polyethylene glycol (MIRALAX / GLYCOLAX) 17 g packet Take 17 g by mouth daily. (Patient not taking: Reported on 07/19/2023)   [DISCONTINUED] doxycycline (VIBRAMYCIN) 100 MG capsule Take 1 capsule (100 mg total) by mouth 2 (two) times daily. (Patient not taking: Reported on 02/21/2023)   [DISCONTINUED] Insulin Disposable Pump (OMNIPOD 5 G6 PODS, GEN 5,) MISC 1 Device by Does not apply route every 3 (three) days. (Patient not taking: Reported on 07/19/2023)   No facility-administered encounter medications on file as of 07/19/2023.    ALLERGIES: Allergies  Allergen Reactions   Fructose Other (See Comments)    Increase of blood sugar     VACCINATION STATUS: Immunization History  Administered Date(s) Administered   Influenza,inj,Quad PF,6+ Mos 06/08/2015, 06/11/2019, 07/22/2020    Moderna Sars-Covid-2 Vaccination 12/13/2019, 01/15/2020   Pneumococcal Polysaccharide-23 04/10/2013, 02/18/2018    Diabetes He presents for his initial diabetic visit. He has type 1 diabetes mellitus. Onset time: diagnosed at age 32. His disease course has been stable. Hypoglycemia symptoms include nervousness/anxiousness, seizures (mom reports seizure like activity when glucose drops really low), sweats and tremors.  There are no diabetic associated symptoms. There are no hypoglycemic complications. Diabetic complications include a CVA, heart disease (had MI in the past) and nephropathy. (Multiple episodes of DKA, Gastroparesis) Risk factors for coronary artery disease include diabetes mellitus, dyslipidemia, family history, male sex and hypertension. Current diabetic treatment includes intensive insulin program. He is compliant with treatment most of the time. His weight is stable. He is following a generally unhealthy diet. When asked about meal planning, he reported none. He has not had a previous visit with a dietitian. He participates in exercise intermittently. His home blood glucose trend is fluctuating dramatically. (He presents today for his consultation, accompanied by his mother (his legal guardian) with his CGM showing fluctuating glycemic profile.  His POCT A1c today is 6.9%, unchanged from previous visit.  He previously was seeing Dr. Lucianne Muss at [facility] who recently retired, prompting his transfer here.  He monitors glucose using CGM (Dexcom G6).  He drinks mostly diet powerades, diet soda, and coffee with splenda and creamer.  He eats 2-3 meals per day with some snacks.  He engages in routine physical activity by walking but is limited due to residual affects of stroke affecting his right side.  He is UTD on eye exam, has never seen podiatry in the past.  He notes Dr. Lucianne Muss was trying to get him started on the Omnipod 5 but could never get it to go through with insurance.  Analysis of his CGM  shows TIR 45%, TAR 47%, TBR 8% with a GMI of 7.4%.) An ACE inhibitor/angiotensin II receptor blocker is not being taken. He does not see a podiatrist.Eye exam is current.     Review of systems  Constitutional: + Minimally fluctuating body weight, current Body mass index is 20.73 kg/m., no fatigue, no subjective hyperthermia, no subjective hypothermia Eyes: no blurry vision, no xerophthalmia ENT: no sore throat, no nodules palpated in throat, no dysphagia/odynophagia, no hoarseness Cardiovascular: no chest pain, no shortness of breath, no palpitations, no leg swelling Respiratory: no cough, no shortness of breath Gastrointestinal: no nausea/vomiting/diarrhea Musculoskeletal: still has residual affects from previous CVA affecting mostly his right side Skin: no rashes, no hyperemia Neurological: no tremors, no numbness, no tingling, no dizziness Psychiatric: no depression, no anxiety  Objective:     BP (!) 142/84 (BP Location: Right Arm, Patient Position: Sitting, Cuff Size: Large)   Pulse 92   Ht 5\' 11"  (1.803 m)   Wt 148 lb 9.6 oz (67.4 kg)   BMI 20.73 kg/m   Wt Readings from Last 3 Encounters:  07/19/23 148 lb 9.6 oz (67.4 kg)  02/21/23 146 lb 3.2 oz (66.3 kg)  01/27/23 149 lb (67.6 kg)     BP Readings from Last 3 Encounters:  07/19/23 (!) 142/84  02/21/23 110/60  01/27/23 115/74     Physical Exam- Limited  Constitutional:  Body mass index is 20.73 kg/m. , not in acute distress, normal state of mind Eyes:  EOMI, no exophthalmos Neck: Supple Cardiovascular: RRR, no murmurs, rubs, or gallops, no edema Respiratory: Adequate breathing efforts, no crackles, rales, rhonchi, or wheezing Musculoskeletal: no gross deformities, strength intact in all four extremities, no gross restriction of joint movements Skin:  no rashes, no hyperemia Neurological: no tremor with outstretched hands  Diabetic Foot Exam - Simple   No data filed      CMP ( most recent) CMP      Component Value Date/Time   NA 134 (L) 01/14/2023 1852   K 5.5 (A) 05/30/2023  0000   CL 105 01/14/2023 1852   CO2 21 (L) 01/14/2023 1852   GLUCOSE 151 (H) 01/14/2023 1852   BUN 26 (A) 05/30/2023 0000   CREATININE 3.0 (A) 05/30/2023 0000   CREATININE 3.13 (H) 01/14/2023 1852   CALCIUM 9.4 01/14/2023 1852   PROT 8.0 01/14/2023 1852   ALBUMIN 3.8 01/14/2023 1852   AST 17 01/14/2023 1852   ALT 19 01/14/2023 1852   ALKPHOS 101 01/14/2023 1852   BILITOT 0.7 01/14/2023 1852   GFR 32.98 (L) 03/19/2020 1649   EGFR 27 05/30/2023 0000   GFRNONAA 25 (L) 01/14/2023 1852     Diabetic Labs (most recent): Lab Results  Component Value Date   HGBA1C 6.9 (A) 07/19/2023   HGBA1C 6.9 (A) 02/21/2023   HGBA1C 7.6 (H) 10/19/2022     Lipid Panel ( most recent) Lipid Panel     Component Value Date/Time   CHOL 204 (H) 05/18/2021 1632   TRIG 142.0 05/18/2021 1632   HDL 37.60 (L) 05/18/2021 1632   CHOLHDL 5 05/18/2021 1632   VLDL 28.4 05/18/2021 1632   LDLCALC 138 (H) 05/18/2021 1632      Lab Results  Component Value Date   TSH 0.762 07/14/2020   TSH 0.97 03/06/2019   TSH 1.810 02/20/2018   TSH 0.938 04/28/2010           Assessment & Plan:   1) 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  His POCT A1c today is 6.9%, unchanged from previous visit.  He previously was seeing Dr. Lucianne Muss at [facility] who recently retired, prompting his transfer here.  He monitors glucose using CGM (Dexcom G6).  He drinks mostly diet powerades, diet soda, and coffee with splenda and creamer.  He eats 2-3 meals per day with some snacks.  He engages in routine physical activity by walking but is limited due to residual affects of stroke affecting his right side.  He is UTD on eye exam, has never seen podiatry in the past.  He notes Dr. Lucianne Muss was  trying to get him started on the Omnipod 5 but could never get it to go through with insurance.  Analysis of his CGM shows TIR 45%, TAR 47%, TBR 8% with a GMI of 7.4%.  - Nathaniel [name] has currently uncontrolled symptomatic type 1 DM since 37 years of age, with most recent A1c of 6.9 %.   -Recent labs reviewed.  - I had a long discussion with him about the progressive nature of diabetes and the pathology behind its complications. 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 These are all discussed in detail with him.  The following Lifestyle Medicine recommendations according to American College of Lifestyle Medicine [facility]) were discussed and offered to patient and he agrees to start the journey:  A. Whole Foods, Plant-based plate comprising of fruits and vegetables, plant-based proteins, whole-grain carbohydrates was discussed in detail with the patient.   A list for source of those nutrients were also provided to the patient.  Patient will use only water or unsweetened tea for hydration. B.  The need to stay away from risky substances including alcohol, smoking; obtaining 7 to 9 hours  of restorative sleep, at least 150 minutes of moderate intensity exercise weekly, the importance of healthy social connections,  and stress reduction techniques were discussed. C.  A full color page of Calorie density of various food groups per pound showing examples of each food groups was provided to the patient.  - I have counseled him on diet and weight management by adopting a carbohydrate restricted/protein rich diet. Patient is encouraged to switch to unprocessed or minimally processed complex starch and increased protein intake (animal or plant source), fruits, and vegetables. -  he is advised to stick to a routine  mealtimes to eat 3 meals a day and avoid unnecessary snacks (to snack only to correct hypoglycemia).   - he acknowledges that there is a room for improvement in his food and drink choices. - Suggestion is made for him to avoid simple carbohydrates from his diet including Cakes, Sweet Desserts, Ice Cream, Soda (diet and regular), Sweet Tea, Candies, Chips, Cookies, Store Bought Juices, Alcohol in Excess of 1-2 drinks a day, Artificial Sweeteners, Coffee Creamer, and "Sugar-free" Products. This will help patient to have more stable blood glucose profile and potentially avoid unintended weight gain.  - I have approached him with the following individualized plan to manage his diabetes and patient agrees:   -Due to recent drops in glucose overnight, will lower his Nathaniel [name] to 16 units SQ nightly (changing from am administration time) and adjust his Novolog to 2-5 units TID with meals if glucose is above 90 and he is eating (Specific instructions on how to titrate insulin dosage based on glucose readings given to patient in writing).  This is closer to his weight based estimation of insulin needs.  He and his mom both demonstrated ability to properly use the SSI chart to dose mealtime insulin with me today.  I did re-order Omniood 5 for him.  He is an excellent candidate for this given his brittle diabetes with unpredictable insulin sensitivity.  -he is encouraged to start/continue monitoring glucose 4 times daily (using his CGM), before meals and before bed, to log their readings on the clinic sheets provided, and bring them to review at follow up appointment in 3 months.  - he is warned not to take insulin without proper monitoring per orders. - Adjustment parameters are given to him for hypo and hyperglycemia in writing. - he is encouraged to call clinic for blood glucose levels less than 70 or above 300 mg /dl.  -He is not a candidate for non insulin therapies due to type 1 diagnosis.  - Specific  targets for  A1c; LDL, HDL, and Triglycerides were discussed with the patient.  2) Blood Pressure /Hypertension:  his blood pressure is controlled to target.   he is advised to continue his current medications prescribed by his PCP.  3) Lipids/Hyperlipidemia:    There is no recent lipid panel to review.  he is advised to continue Lipitor 80 mg daily at bedtime.  Side effects and precautions discussed with him.  4)  Weight/Diet:  his Body mass index is 20.73 kg/m.  -  he is NOT a candidate for weight loss. I discussed with him the fact that loss of 5 - 10% of his  current body weight will have the most impact on his diabetes management.  Exercise, and detailed carbohydrates information provided  -  detailed on discharge instructions.  5) Chronic Care/Health Maintenance: -he is not on ACEI/ARB and is on Statin medications and is encouraged to initiate and continue  to follow up with Ophthalmology, Dentist, Podiatrist at least yearly or according to recommendations, and advised to stay away from smoking. I have recommended yearly flu vaccine and pneumonia vaccine at least every 5 years; moderate intensity exercise for up to 150 minutes weekly; and sleep for at least 7 hours a day.  - he is advised to maintain close follow up with Benetta Spar, MD for primary care needs, as well as his other providers for optimal and coordinated care.   - Time spent in this patient care: 60 min, of which > 50% was spent in counseling him about his diabetes and the rest reviewing his blood glucose logs, discussing his hypoglycemia and hyperglycemia episodes, reviewing his current and previous labs/studies (including abstraction from other facilities) and medications doses and developing a long term treatment plan based on the latest standards of care/guidelines; and documenting his care.    Please refer to Patient Instructions for Blood Glucose Monitoring and Insulin/Medications Dosing Guide" in media tab  for additional information. Please also refer to "Patient Self Inventory" in the Media tab for reviewed elements of pertinent patient history.  Sherrill Raring participated in the discussions, expressed understanding, and voiced agreement with the above plans.  All questions were answered to his satisfaction. he is encouraged to contact clinic should he have any questions or concerns prior to his return visit.     Follow up plan: - Return in about 3 months (around 10/19/2023) for Diabetes F/U with A1c in office, No previsit labs, Bring meter and logs.    Ronny Bacon, [facility] [address] [place], [place] [address] Phone: [phone] Fax: [phone]  07/20/2023, 7:52 AM

## 2023-10-15 ENCOUNTER — Other Ambulatory Visit: Payer: Self-pay | Admitting: Nurse Practitioner

## 2023-10-20 ENCOUNTER — Ambulatory Visit (INDEPENDENT_AMBULATORY_CARE_PROVIDER_SITE_OTHER): Payer: Medicare HMO | Admitting: Nurse Practitioner

## 2023-10-20 ENCOUNTER — Encounter: Payer: Self-pay | Admitting: Nurse Practitioner

## 2023-10-20 VITALS — BP 116/72 | HR 83 | Ht 71.0 in | Wt 146.4 lb

## 2023-10-20 DIAGNOSIS — E1022 Type 1 diabetes mellitus with diabetic chronic kidney disease: Secondary | ICD-10-CM

## 2023-10-20 DIAGNOSIS — I10 Essential (primary) hypertension: Secondary | ICD-10-CM

## 2023-10-20 DIAGNOSIS — N184 Chronic kidney disease, stage 4 (severe): Secondary | ICD-10-CM | POA: Diagnosis not present

## 2023-10-20 DIAGNOSIS — E7800 Pure hypercholesterolemia, unspecified: Secondary | ICD-10-CM

## 2023-10-20 LAB — POCT GLYCOSYLATED HEMOGLOBIN (HGB A1C): Hemoglobin A1C: 8 % — AB (ref 4.0–5.6)

## 2023-10-20 NOTE — Progress Notes (Signed)
Endocrinology Follow Up Note       10/20/2023, 3:39 PM   Subjective:    Patient ID: Nathaniel [name], male    DOB: 07-16-1986.  Sherrill Raring is being seen in follow up after being seen in consultation for management of currently uncontrolled symptomatic diabetes requested by  Benetta Spar, MD.   Past Medical History:  Diagnosis Date   Diabetes mellitus    lantus/novolog   Hyperlipidemia    Hypertension    Marijuana abuse    occaisionally   Noncompliance with medication regimen    Stroke (HCC) 2020   Tobacco abuse    5/day    Past Surgical History:  Procedure Laterality Date   AV FISTULA PLACEMENT Left 01/08/2021   Procedure: LEFT ARM ARTERIOVENOUS (AV) FISTULA CREATION;  Surgeon: Larina Earthly, MD;  Location: [facility];  Service: Vascular;  Laterality: Left;   EYE SURGERY      Social History   Socioeconomic History   Marital status: Single    Spouse name: Not on file   Number of children: Not on file   Years of education: Not on file   Highest education level: Not on file  Occupational History   Occupation: disabled  Tobacco Use   Smoking status: Every Day    Current packs/day: 0.50    Average packs/day: 0.5 packs/day for 8.0 years (4.0 ttl pk-yrs)    Types: Cigarettes   Smokeless tobacco: Never   Tobacco comments:    smoke 5-8 per day  Vaping Use   Vaping status: Never Used  Substance and Sexual Activity   Alcohol use: Never   Drug use: Yes    Types: Marijuana    Comment: last use yesterday, every other days    Sexual activity: Yes    Birth control/protection: Condom  Other Topics Concern   Not on file  Social History Narrative   Not on file   Social Drivers of Health   Financial Resource Strain: Low Risk  (01/17/2023)   Overall Financial Resource Strain (CARDIA)    Difficulty of Paying Living Expenses: Not hard at all  Food Insecurity: No Food Insecurity  (01/17/2023)   Hunger Vital Sign    Worried About Running Out of Food in the Last Year: Never true    Ran Out of Food in the Last Year: Never true  Transportation Needs: No Transportation Needs (01/17/2023)   PRAPARE - Administrator, Civil Service (Medical): No    Lack of Transportation (Non-Medical): No  Physical Activity: Not on file  Stress: Not on file  Social Connections: Not on file    Family History  Problem Relation Age of Onset   Diabetes Father    Kidney failure Father        last 4 mnths of life   Hypertension Mother    Diabetes Maternal Grandmother    Pancreatic cancer Maternal Grandfather    Diabetes Paternal Grandfather     Outpatient Encounter Medications as of 10/20/2023  Medication Sig   acetaminophen (TYLENOL) 325 MG tablet Take 1-2 tablets (325-650 mg total) by mouth every 4 (four) hours as needed for mild pain.   amLODipine (NORVASC)  10 MG tablet Take 1 tablet (10 mg total) by mouth daily. For blood pressure   ASPIRIN LOW DOSE 81 MG EC tablet Take 81 mg by mouth every morning.   atorvastatin (LIPITOR) 80 MG tablet Take 1 tablet (80 mg total) by mouth every evening. For stroke prevention   clopidogrel (PLAVIX) 75 MG tablet Take 1 tablet (75 mg total) by mouth daily. For stroke prevention   Continuous Blood Gluc Sensor (DEXCOM G6 SENSOR) MISC 1 each by Does not apply route every 14 (fourteen) days.   Continuous Blood Gluc Transmit (DEXCOM G6 TRANSMITTER) MISC Check sugar 4 times a day   ferrous sulfate 325 (65 FE) MG tablet Take 325 mg by mouth daily with breakfast.   glucose blood (ONETOUCH ULTRA) test strip USE TO TEST BLOOD SUGAR 4 TIMES A DAY   insulin aspart (NOVOLOG FLEXPEN) 100 UNIT/ML FlexPen Inject 2-5 Units into the skin 3 (three) times daily with meals.   insulin degludec (TRESIBA FLEXTOUCH) 100 UNIT/ML FlexTouch Pen INJECT 16 UNITS INTO THE SKIN DAILY.   labetalol (NORMODYNE) 100 MG tablet Take 1 tablet (100 mg total) by mouth 2 (two) times  daily. For BP   Multiple Vitamin (MULTIVITAMIN) tablet Take 1 tablet by mouth daily.   Nutritional Supplements (GLUCERNA 1.5 CAL PO) Take 237 mLs by mouth daily.   sodium zirconium cyclosilicate (LOKELMA) 10 g PACK packet Take 10 g by mouth daily.   Vitamin D, Ergocalciferol, (DRISDOL) 1.25 MG (50000 UNIT) CAPS capsule Take 1 capsule (50,000 Units total) by mouth once a week. (Patient taking differently: Take 50,000 Units by mouth every Friday.)   Insulin Disposable Pump (OMNIPOD 5 DEXG7G6 INTRO GEN 5) KIT Change pod every 72 hours (Patient not taking: Reported on 10/20/2023)   Insulin Disposable Pump (OMNIPOD 5 DEXG7G6 PODS GEN 5) MISC Change pod every 72 hours (Patient not taking: Reported on 10/20/2023)   Insulin Pen Needle (PEN NEEDLES 3/16") 31G X 5 MM MISC Four times day (Patient not taking: Reported on 10/20/2023)   polyethylene glycol (MIRALAX / GLYCOLAX) 17 g packet Take 17 g by mouth daily. (Patient not taking: Reported on 10/20/2023)   No facility-administered encounter medications on file as of 10/20/2023.    ALLERGIES: Allergies  Allergen Reactions   Fructose Other (See Comments)    Increase of blood sugar     VACCINATION STATUS: Immunization History  Administered Date(s) Administered   Influenza,inj,Quad PF,6+ Mos 06/08/2015, 06/11/2019, 07/22/2020   Moderna Sars-Covid-2 Vaccination 12/13/2019, 01/15/2020   Pneumococcal Polysaccharide-23 04/10/2013, 02/18/2018    Diabetes He presents for his follow-up diabetic visit. He has type 1 diabetes mellitus. Onset time: diagnosed at age 23. His disease course has been fluctuating. Hypoglycemia symptoms include nervousness/anxiousness, seizures (mom reports seizure like activity when glucose drops really low), sweats and tremors. There are no diabetic associated symptoms. Hypoglycemia complications include nocturnal hypoglycemia. Diabetic complications include a CVA, heart disease (had MI in the past) and nephropathy. (Multiple episodes  of DKA, Gastroparesis) Risk factors for coronary artery disease include diabetes mellitus, dyslipidemia, family history, male sex and hypertension. Current diabetic treatment includes intensive insulin program. He is compliant with treatment most of the time. His weight is stable. He is following a generally unhealthy diet. When asked about meal planning, he reported none. He has not had a previous visit with a dietitian. He participates in exercise intermittently. His home blood glucose trend is fluctuating dramatically. His overall blood glucose range is 180-200 mg/dl. (He presents today, accompanied by his mom, with his  CGM showing fluctuating glycemic profile with sudden drops in glucose (mostly overnight).  His POCT A1c today is 8%, increasing from last visit of 6.9%.  Analysis of his CGM shows TIR 47%, TAR 48%, TBR 5% with a GMI of 7.8%.  He did receive his Omnipod 5 but has not proceeded with training as he was nervous on how it worked.) An ACE inhibitor/angiotensin II receptor blocker is not being taken. He does not see a podiatrist.Eye exam is current.     Review of systems  Constitutional: + Minimally fluctuating body weight, current Body mass index is 20.42 kg/m., no fatigue, no subjective hyperthermia, no subjective hypothermia Eyes: no blurry vision, no xerophthalmia ENT: no sore throat, no nodules palpated in throat, no dysphagia/odynophagia, no hoarseness Cardiovascular: no chest pain, no shortness of breath, no palpitations, no leg swelling Respiratory: no cough, no shortness of breath Gastrointestinal: no nausea/vomiting/diarrhea Musculoskeletal: still has residual affects from previous CVA affecting mostly his right side Skin: no rashes, no hyperemia Neurological: no tremors, no numbness, no tingling, no dizziness Psychiatric: no depression, no anxiety  Objective:     BP 116/72 (BP Location: Right Arm, Patient Position: Sitting, Cuff Size: Large)   Pulse 83   Ht 5\' 11"  (1.803  m)   Wt 146 lb 6.4 oz (66.4 kg)   BMI 20.42 kg/m   Wt Readings from Last 3 Encounters:  10/20/23 146 lb 6.4 oz (66.4 kg)  07/19/23 148 lb 9.6 oz (67.4 kg)  02/21/23 146 lb 3.2 oz (66.3 kg)     BP Readings from Last 3 Encounters:  10/20/23 116/72  07/19/23 (!) 142/84  02/21/23 110/60     Physical Exam- Limited  Constitutional:  Body mass index is 20.42 kg/m. , not in acute distress, normal state of mind Eyes:  EOMI, no exophthalmos Musculoskeletal: no gross deformities, strength intact in all four extremities, no gross restriction of joint movements Skin:  no rashes, no hyperemia Neurological: no tremor with outstretched hands  Diabetic Foot Exam - Simple   Simple Foot Form Diabetic Foot exam was performed with the following findings: Yes 10/20/2023  3:34 PM  Visual Inspection See comments: Yes Sensation Testing Intact to touch and monofilament testing bilaterally: Yes Pulse Check Posterior Tibialis and Dorsalis pulse intact bilaterally: Yes Comments Diminished pulses bilaterally; onychomycosis bilaterally with dry flaky skin      CMP ( most recent) CMP     Component Value Date/Time   NA 134 (L) 01/14/2023 1852   K 5.5 (A) 05/30/2023 0000   CL 105 01/14/2023 1852   CO2 21 (L) 01/14/2023 1852   GLUCOSE 151 (H) 01/14/2023 1852   BUN 26 (A) 05/30/2023 0000   CREATININE 3.0 (A) 05/30/2023 0000   CREATININE 3.13 (H) 01/14/2023 1852   CALCIUM 9.4 01/14/2023 1852   PROT 8.0 01/14/2023 1852   ALBUMIN 3.8 01/14/2023 1852   AST 17 01/14/2023 1852   ALT 19 01/14/2023 1852   ALKPHOS 101 01/14/2023 1852   BILITOT 0.7 01/14/2023 1852   GFR 32.98 (L) 03/19/2020 1649   EGFR 27 05/30/2023 0000   GFRNONAA 25 (L) 01/14/2023 1852     Diabetic Labs (most recent): Lab Results  Component Value Date   HGBA1C 8.0 (A) 10/20/2023   HGBA1C 6.9 (A) 07/19/2023   HGBA1C 6.9 (A) 02/21/2023     Lipid Panel ( most recent) Lipid Panel     Component Value Date/Time   CHOL 204  (H) 05/18/2021 1632   TRIG 142.0 05/18/2021 1632  HDL 37.60 (L) 05/18/2021 1632   CHOLHDL 5 05/18/2021 1632   VLDL 28.4 05/18/2021 1632   LDLCALC 138 (H) 05/18/2021 1632      Lab Results  Component Value Date   TSH 0.762 07/14/2020   TSH 0.97 03/06/2019   TSH 1.810 02/20/2018   TSH 0.938 04/28/2010           Assessment & Plan:   1) 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  His POCT A1c today is 8%, increasing from last visit of 6.9%.  Analysis of his CGM shows TIR 47%, TAR 48%, TBR 5% with a GMI of 7.8%.  He did receive his Omnipod 5 but has not proceeded with training as he was nervous on how it worked.  He notes he tends to not eat all day.   - HOLSTON OYAMA has currently uncontrolled symptomatic type 1 DM since 38 years of age, with most recent A1c of 6.9 %.   -Recent labs reviewed.  - I had a long discussion with him about the progressive nature of diabetes and the pathology behind its complications. 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 These are all discussed in detail with him.  The following Lifestyle Medicine recommendations according to American College of Lifestyle Medicine [facility]) were discussed and offered to patient and he agrees to start the journey:  A. Whole Foods, Plant-based plate comprising of fruits and vegetables, plant-based proteins, whole-grain carbohydrates was discussed in detail with the patient.   A list for source of those nutrients were also provided to the patient.  Patient will use only water or unsweetened tea for hydration. B.  The need to stay away from risky substances including  alcohol, smoking; obtaining 7 to 9 hours of restorative sleep, at least 150 minutes of moderate intensity exercise weekly, the importance of healthy social connections,  and stress reduction techniques were discussed. C.  A full color page of Calorie density of various food groups per pound showing examples of each food groups was provided to the patient.  - Nutritional counseling repeated at each appointment due to patients tendency to fall back in to old habits.  - The patient admits there is a room for improvement in their diet and drink choices. -  Suggestion is made for the patient to avoid simple carbohydrates from their diet including Cakes, Sweet Desserts / Pastries, Ice Cream, Soda (diet and regular), Sweet Tea, Candies, Chips, Cookies, Sweet Pastries, Store Bought Juices, Alcohol in Excess of 1-2 drinks a day, Artificial Sweeteners, Coffee Creamer, and "Sugar-free" Products. This will help patient to have stable blood glucose profile and potentially avoid unintended weight gain.   - I encouraged the patient to switch to unprocessed or minimally processed complex starch and increased protein intake (animal or plant source), fruits, and vegetables.   - Patient is advised to stick to a routine mealtimes to eat 3 meals a day and avoid unnecessary snacks (to snack only to correct hypoglycemia).  - I have approached him with the following individualized plan to manage his diabetes and patient agrees:   -Due to recent drops in glucose overnight, will lower his Evaristo Bury to 14 units  SQ nightly  and continue his Novolog 2-5 units TID with meals if glucose is above 90 and he is eating (Specific instructions on how to titrate insulin dosage based on glucose readings given to patient in writing).   I did sent email to [facility] trainer after our discussion today as he is ready to start.  -he is encouraged to start/continue monitoring glucose 4 times daily (using his CGM), before meals and before bed, to  log their readings on the clinic sheets provided, and bring them to review at follow up appointment in 3 months.  I encouraged him to make nurse visit for next week so we can get his Dexcom set up on his phone so when he does set up training for Omnipod it will go seamlessly.  - he is warned not to take insulin without proper monitoring per orders. - Adjustment parameters are given to him for hypo and hyperglycemia in writing. - he is encouraged to call clinic for blood glucose levels less than 70 or above 300 mg /dl.  -He is not a candidate for non insulin therapies due to type 1 diagnosis.  - Specific targets for  A1c; LDL, HDL, and Triglycerides were discussed with the patient.  2) Blood Pressure /Hypertension:  his blood pressure is controlled to target.   he is advised to continue his current medications prescribed by his PCP.  3) Lipids/Hyperlipidemia:    There is no recent lipid panel to review.  he is advised to continue Lipitor 80 mg daily at bedtime.  Side effects and precautions discussed with him.  4)  Weight/Diet:  his Body mass index is 20.42 kg/m.  -  he is NOT a candidate for weight loss. I discussed with him the fact that loss of 5 - 10% of his  current body weight will have the most impact on his diabetes management.  Exercise, and detailed carbohydrates information provided  -  detailed on discharge instructions.  5) Chronic Care/Health Maintenance: -he is not on ACEI/ARB and is on Statin medications and is encouraged to initiate and continue to follow up with Ophthalmology, Dentist, Podiatrist at least yearly or according to recommendations, and advised to stay away from smoking. I have recommended yearly flu vaccine and pneumonia vaccine at least every 5 years; moderate intensity exercise for up to 150 minutes weekly; and sleep for at least 7 hours a day.  - he is advised to maintain close follow up with Benetta Spar, MD for primary care needs, as well as his  other providers for optimal and coordinated care.     I spent  43  minutes in the care of the patient today including review of labs from CMP, Lipids, Thyroid Function, Hematology (current and previous including abstractions from other facilities); face-to-face time discussing  his blood glucose readings/logs, discussing hypoglycemia and hyperglycemia episodes and symptoms, medications doses, his options of short and long term treatment based on the latest standards of care / guidelines;  discussion about incorporating lifestyle medicine;  and documenting the encounter. Risk reduction counseling performed per USPSTF guidelines to reduce obesity and cardiovascular risk factors.     Please refer to Patient Instructions for Blood Glucose Monitoring and Insulin/Medications Dosing Guide"  in media tab for additional information. Please  also refer to " Patient Self Inventory" in the Media  tab for reviewed elements of pertinent patient history.  Sherrill Raring participated in the discussions, expressed understanding, and voiced agreement with the above plans.  All questions  were answered to his satisfaction. he is encouraged to contact clinic should he have any questions or concerns prior to his return visit.     Follow up plan: - Return in about 3 months (around 01/18/2024) for Diabetes F/U with A1c in office, No previsit labs, Bring meter and logs.    Ronny Bacon, [facility] [address] [place], [place] [address] Phone: [phone] Fax: [phone]  10/20/2023, 3:39 PM

## 2023-10-25 ENCOUNTER — Other Ambulatory Visit: Payer: Self-pay | Admitting: Nurse Practitioner

## 2023-10-25 MED ORDER — INSULIN ASPART 100 UNIT/ML IJ SOLN: INTRAMUSCULAR | 11 refills | Status: DC

## 2023-10-31 ENCOUNTER — Other Ambulatory Visit: Payer: Self-pay | Admitting: Nurse Practitioner

## 2023-10-31 MED ORDER — INSULIN LISPRO 100 UNIT/ML IJ SOLN: INTRAMUSCULAR | 3 refills | Status: DC

## 2023-11-04 ENCOUNTER — Other Ambulatory Visit: Payer: Self-pay | Admitting: Nurse Practitioner

## 2023-11-15 ENCOUNTER — Other Ambulatory Visit: Payer: Self-pay | Admitting: Nurse Practitioner

## 2023-11-15 ENCOUNTER — Telehealth: Payer: Self-pay | Admitting: *Deleted

## 2023-11-15 MED ORDER — OMNIPOD 5 DEXG7G6 PODS GEN 5 MISC: 6 refills | Status: DC

## 2023-11-15 MED ORDER — INSULIN ASPART 100 UNIT/ML IJ SOLN: INTRAMUSCULAR | 3 refills | Status: DC

## 2023-11-15 NOTE — Telephone Encounter (Signed)
 Patient was called and a message was left letting him know prescriptions had been called in.Nathaniel [name]

## 2023-11-15 NOTE — Telephone Encounter (Signed)
 Patient's wife called and left a message that patient need his Omnipods refilled. They called but they would not accept this request from them , they were told that his provider would have to do this.

## 2023-11-15 NOTE — Telephone Encounter (Signed)
 I sent in refill for Omnipods and insulin vials for him to the pharmacy we had on file.

## 2023-11-16 ENCOUNTER — Other Ambulatory Visit: Payer: Self-pay | Admitting: Nurse Practitioner

## 2023-12-13 ENCOUNTER — Other Ambulatory Visit: Payer: Self-pay | Admitting: Nurse Practitioner

## 2024-01-18 ENCOUNTER — Encounter: Payer: Self-pay | Admitting: Nurse Practitioner

## 2024-01-18 ENCOUNTER — Ambulatory Visit (INDEPENDENT_AMBULATORY_CARE_PROVIDER_SITE_OTHER): Payer: Medicare HMO | Admitting: Nurse Practitioner

## 2024-01-18 VITALS — BP 116/76 | HR 74 | Ht 71.0 in | Wt 140.0 lb

## 2024-01-18 DIAGNOSIS — E1022 Type 1 diabetes mellitus with diabetic chronic kidney disease: Secondary | ICD-10-CM | POA: Diagnosis not present

## 2024-01-18 DIAGNOSIS — N184 Chronic kidney disease, stage 4 (severe): Secondary | ICD-10-CM | POA: Diagnosis not present

## 2024-01-18 DIAGNOSIS — E7800 Pure hypercholesterolemia, unspecified: Secondary | ICD-10-CM

## 2024-01-18 DIAGNOSIS — I10 Essential (primary) hypertension: Secondary | ICD-10-CM | POA: Diagnosis not present

## 2024-01-18 LAB — POCT GLYCOSYLATED HEMOGLOBIN (HGB A1C): Hemoglobin A1C: 8.4 % — AB (ref 4.0–5.6)

## 2024-01-18 NOTE — Progress Notes (Signed)
 Endocrinology Follow Up Note       01/18/2024, 3:51 PM   Subjective:    Patient ID: Nathaniel [name], male    DOB: 10-18-1985.  Nathaniel [name] is being seen in follow up after being seen in consultation for management of currently uncontrolled symptomatic diabetes requested by  Wyvonna Heidelberg, MD.   Past Medical History:  Diagnosis Date   Diabetes mellitus    lantus /novolog    Hyperlipidemia    Hypertension    Marijuana abuse    occaisionally   Noncompliance with medication regimen    Stroke (HCC) 2020   Tobacco abuse    5/day    Past Surgical History:  Procedure Laterality Date   AV FISTULA PLACEMENT Left 01/08/2021   Procedure: LEFT ARM ARTERIOVENOUS (AV) FISTULA CREATION;  Surgeon: Mayo Speck, MD;  Location: [facility];  Service: Vascular;  Laterality: Left;   EYE SURGERY      Social History   Socioeconomic History   Marital status: Single    Spouse name: Not on file   Number of children: Not on file   Years of education: Not on file   Highest education level: Not on file  Occupational History   Occupation: disabled  Tobacco Use   Smoking status: Every Day    Current packs/day: 0.50    Average packs/day: 0.5 packs/day for 8.0 years (4.0 ttl pk-yrs)    Types: Cigarettes   Smokeless tobacco: Never   Tobacco comments:    smoke 5-8 per day  Vaping Use   Vaping status: Never Used  Substance and Sexual Activity   Alcohol use: Never   Drug use: Yes    Types: Marijuana    Comment: last use yesterday, every other days    Sexual activity: Yes    Birth control/protection: Condom  Other Topics Concern   Not on file  Social History Narrative   Not on file   Social Drivers of Health   Financial Resource Strain: Low Risk  (01/17/2023)   Overall Financial Resource Strain (CARDIA)    Difficulty of Paying Living Expenses: Not hard at all  Food Insecurity: No Food Insecurity  (01/17/2023)   Hunger Vital Sign    Worried About Running Out of Food in the Last Year: Never true    Ran Out of Food in the Last Year: Never true  Transportation Needs: No Transportation Needs (01/17/2023)   PRAPARE - Administrator, Civil Service (Medical): No    Lack of Transportation (Non-Medical): No  Physical Activity: Not on file  Stress: Not on file  Social Connections: Not on file    Family History  Problem Relation Age of Onset   Diabetes Father    Kidney failure Father        last 4 mnths of life   Hypertension Mother    Diabetes Maternal Grandmother    Pancreatic cancer Maternal Grandfather    Diabetes Paternal Grandfather     Outpatient Encounter Medications as of 01/18/2024  Medication Sig   acetaminophen  (TYLENOL ) 325 MG tablet Take 1-2 tablets (325-650 mg total) by mouth every 4 (four) hours as needed for mild pain.   amLODipine  (NORVASC )  10 MG tablet Take 1 tablet (10 mg total) by mouth daily. For blood pressure   ASPIRIN  LOW DOSE 81 MG EC tablet Take 81 mg by mouth every morning.   atorvastatin  (LIPITOR ) 80 MG tablet Take 1 tablet (80 mg total) by mouth every evening. For stroke prevention   clopidogrel  (PLAVIX ) 75 MG tablet Take 1 tablet (75 mg total) by mouth daily. For stroke prevention   Continuous Blood Gluc Sensor (DEXCOM G6 SENSOR) MISC 1 each by Does not apply route every 14 (fourteen) days.   Continuous Blood Gluc Transmit (DEXCOM G6 TRANSMITTER) MISC Check sugar 4 times a day   ferrous sulfate 325 (65 FE) MG tablet Take 325 mg by mouth daily with breakfast.   insulin  aspart (FIASP  FLEXTOUCH) 100 UNIT/ML FlexTouch Pen INJECT 2-5 UNITS INTO THE SKIN 3 (THREE) TIMES DAILY BEFORE MEALS.   insulin  aspart (NOVOLOG ) 100 UNIT/ML injection Use with Omnipod for TDD around 50 units daily   Insulin  Disposable Pump (OMNIPOD 5 DEXG7G6 INTRO GEN 5) KIT Change pod every 72 hours   Insulin  Disposable Pump (OMNIPOD 5 DEXG7G6 PODS GEN 5) MISC Change pod every 72  hours   Insulin  Pen Needle (PEN NEEDLES 3/16") 31G X 5 MM MISC Four times day   labetalol  (NORMODYNE ) 100 MG tablet Take 1 tablet (100 mg total) by mouth 2 (two) times daily. For BP   Multiple Vitamin (MULTIVITAMIN) tablet Take 1 tablet by mouth daily.   Nutritional Supplements (GLUCERNA 1.5 CAL PO) Take 237 mLs by mouth daily.   sodium zirconium cyclosilicate  (LOKELMA ) 10 g PACK packet Take 10 g by mouth daily.   Vitamin D , Ergocalciferol , (DRISDOL ) 1.25 MG (50000 UNIT) CAPS capsule Take 1 capsule (50,000 Units total) by mouth once a week. (Patient taking differently: Take 50,000 Units by mouth every Friday.)   glucose blood (ONETOUCH ULTRA) test strip USE TO TEST BLOOD SUGAR 4 TIMES A DAY   polyethylene glycol (MIRALAX  / GLYCOLAX ) 17 g packet Take 17 g by mouth daily. (Patient not taking: Reported on 07/19/2023)   No facility-administered encounter medications on file as of 01/18/2024.    ALLERGIES: Allergies  Allergen Reactions   Fructose Other (See Comments)    Increase of blood sugar     VACCINATION STATUS: Immunization History  Administered Date(s) Administered   Influenza,inj,Quad PF,6+ Mos 06/08/2015, 06/11/2019, 07/22/2020   Moderna Sars-Covid-2 Vaccination 12/13/2019, 01/15/2020   Pneumococcal Polysaccharide-23 04/10/2013, 02/18/2018    Diabetes He presents for his follow-up diabetic visit. He has type 1 diabetes mellitus. Onset time: diagnosed at age 26. His disease course has been improving. Hypoglycemia symptoms include nervousness/anxiousness, seizures (mom reports seizure like activity when glucose drops really low), sweats and tremors. There are no diabetic associated symptoms. There are no hypoglycemic complications. Diabetic complications include a CVA, heart disease (had MI in the past) and nephropathy. (Multiple episodes of DKA, Gastroparesis) Risk factors for coronary artery disease include diabetes mellitus, dyslipidemia, family history, male sex and hypertension.  Current diabetic treatment includes insulin  pump. He is compliant with treatment most of the time. His weight is stable. He is following a generally unhealthy diet. When asked about meal planning, he reported none. He has not had a previous visit with a dietitian. He participates in exercise intermittently. His home blood glucose trend is decreasing steadily. His overall blood glucose range is 140-180 mg/dl. (He presents today, accompanied by his mom, with his CGM and Omnipod showing mostly at target glycemic profile.  His POCT A1c today is 8.4%, increasing from  last visit of 8%.  Analysis of his CGM shows TIR 62%, TAR 34%, TBR 4%.  So far, he is loving his CGM.  He does have less hypoglycemia compared to MDI.  He does admit he has trouble with carb counting at times, finds himself using 90 grams of carbs at bolus times but may not be taking in 90 grams of carbs, thus causing a low reading shortly after.) An ACE inhibitor/angiotensin II receptor blocker is not being taken. He does not see a podiatrist.Eye exam is current.     Review of systems  Constitutional: + Minimally fluctuating body weight, current Body mass index is 19.53 kg/m., no fatigue, no subjective hyperthermia, no subjective hypothermia Eyes: no blurry vision, no xerophthalmia ENT: no sore throat, no nodules palpated in throat, no dysphagia/odynophagia, no hoarseness Cardiovascular: no chest pain, no shortness of breath, no palpitations, no leg swelling Respiratory: no cough, no shortness of breath Gastrointestinal: no nausea/vomiting/diarrhea Musculoskeletal: still has residual affects from previous CVA affecting mostly his right side Skin: no rashes, no hyperemia Neurological: no tremors, no numbness, no tingling, no dizziness Psychiatric: no depression, no anxiety  Objective:     BP 116/76 (BP Location: Right Arm, Patient Position: Sitting, Cuff Size: Large)   Pulse 74   Ht 5\' 11"  (1.803 m)   Wt 140 lb (63.5 kg)   BMI 19.53  kg/m   Wt Readings from Last 3 Encounters:  01/18/24 140 lb (63.5 kg)  10/20/23 146 lb 6.4 oz (66.4 kg)  07/19/23 148 lb 9.6 oz (67.4 kg)     BP Readings from Last 3 Encounters:  01/18/24 116/76  10/20/23 116/72  07/19/23 (!) 142/84      Physical Exam- Limited  Constitutional:  Body mass index is 19.53 kg/m. , not in acute distress, normal state of mind Eyes:  EOMI, no exophthalmos Musculoskeletal: no gross deformities, strength intact in all four extremities, no gross restriction of joint movements Skin:  no rashes, no hyperemia Neurological: no tremor with outstretched hands  Diabetic Foot Exam - Simple   No data filed      CMP ( most recent) CMP     Component Value Date/Time   NA 134 (L) 01/14/2023 1852   K 5.5 (A) 05/30/2023 0000   CL 105 01/14/2023 1852   CO2 21 (L) 01/14/2023 1852   GLUCOSE 151 (H) 01/14/2023 1852   BUN 26 (A) 05/30/2023 0000   CREATININE 3.0 (A) 05/30/2023 0000   CREATININE 3.13 (H) 01/14/2023 1852   CALCIUM  9.4 01/14/2023 1852   PROT 8.0 01/14/2023 1852   ALBUMIN 3.8 01/14/2023 1852   AST 17 01/14/2023 1852   ALT 19 01/14/2023 1852   ALKPHOS 101 01/14/2023 1852   BILITOT 0.7 01/14/2023 1852   GFR 32.98 (L) 03/19/2020 1649   EGFR 27 05/30/2023 0000   GFRNONAA 25 (L) 01/14/2023 1852     Diabetic Labs (most recent): Lab Results  Component Value Date   HGBA1C 8.4 (A) 01/18/2024   HGBA1C 8.0 (A) 10/20/2023   HGBA1C 6.9 (A) 07/19/2023     Lipid Panel ( most recent) Lipid Panel     Component Value Date/Time   CHOL 204 (H) 05/18/2021 1632   TRIG 142.0 05/18/2021 1632   HDL 37.60 (L) 05/18/2021 1632   CHOLHDL 5 05/18/2021 1632   VLDL 28.4 05/18/2021 1632   LDLCALC 138 (H) 05/18/2021 1632      Lab Results  Component Value Date   TSH 0.762 07/14/2020   TSH 0.97 03/06/2019  TSH 1.810 02/20/2018   TSH 0.938 04/28/2010           Assessment & Plan:   1) 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  His POCT A1c today is 8.4%, increasing from last visit of 8%.  Analysis of his CGM shows TIR 62%, TAR 34%, TBR 4%.  So far, he is loving his CGM.  He does have less hypoglycemia compared to MDI.  He does admit he has trouble with carb counting at times, finds himself using 90 grams of carbs at bolus times but may not be taking in 90 grams of carbs, thus causing a low reading shortly after.  - Nathaniel [name] has currently uncontrolled symptomatic type 1 DM since 38 years of age.   -Recent labs reviewed.  - I had a long discussion with him about the progressive nature of diabetes and the pathology behind its complications. 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 These are all discussed in detail with him.  The following Lifestyle Medicine recommendations according to American College of Lifestyle Medicine [facility]) were discussed and offered to patient and he agrees to start the journey:  A. Whole Foods, Plant-based plate comprising of fruits and vegetables, plant-based proteins, whole-grain carbohydrates was discussed in detail with the patient.   A list for source of those nutrients were also provided to the patient.  Patient will use only water or unsweetened tea for hydration. B.  The need to stay away from risky substances including alcohol, smoking; obtaining 7 to 9 hours of restorative sleep, at least 150 minutes of moderate intensity exercise weekly, the importance of healthy social connections,  and stress reduction techniques were discussed. C.  A full color page of Calorie density of various food groups per pound showing examples of each food groups was provided to the patient.  -  Nutritional counseling repeated at each appointment due to patients tendency to fall back in to old habits.  - The patient admits there is a room for improvement in their diet and drink choices. -  Suggestion is made for the patient to avoid simple carbohydrates from their diet including Cakes, Sweet Desserts / Pastries, Ice Cream, Soda (diet and regular), Sweet Tea, Candies, Chips, Cookies, Sweet Pastries, Store Bought Juices, Alcohol in Excess of 1-2 drinks a day, Artificial Sweeteners, Coffee Creamer, and "Sugar-free" Products. This will help patient to have stable blood glucose profile and potentially avoid unintended weight gain.   - I encouraged the patient to switch to unprocessed or minimally processed complex starch and increased protein intake (animal or plant source), fruits, and vegetables.   - Patient is advised to stick to a routine mealtimes to eat 3 meals a day and avoid unnecessary snacks (to snack only to correct hypoglycemia).  - I have approached him with the following individualized plan to manage his diabetes and patient agrees:   -Based on mostly at target glycemic profile, no changes will be made to his Omnipod settings today.  Instead we talked about carb counting strategies and ways to avoid hypoglycemia.  He is advised to continue monitoring blood glucose 4 times daily (using his CGM), and to call the clinic if  he has readings less than 70 or above 300 for 3 tests in a row.  - he is warned not to take insulin  without proper monitoring per orders. - Adjustment parameters are given to him for hypo and hyperglycemia in writing.  -He is not a candidate for non insulin  therapies due to type 1 diagnosis.  - Specific targets for  A1c; LDL, HDL, and Triglycerides were discussed with the patient.  2) Blood Pressure /Hypertension:  his blood pressure is controlled to target.   he is advised to continue his current medications prescribed by his PCP.  3) Lipids/Hyperlipidemia:     There is no recent lipid panel to review.  he is advised to continue Lipitor  80 mg daily at bedtime.  Side effects and precautions discussed with him.  4)  Weight/Diet:  his Body mass index is 19.53 kg/m.  -  he is NOT a candidate for weight loss. I discussed with him the fact that loss of 5 - 10% of his  current body weight will have the most impact on his diabetes management.  Exercise, and detailed carbohydrates information provided  -  detailed on discharge instructions.  5) Chronic Care/Health Maintenance: -he is not on ACEI/ARB and is on Statin medications and is encouraged to initiate and continue to follow up with Ophthalmology, Dentist, Podiatrist at least yearly or according to recommendations, and advised to stay away from smoking. I have recommended yearly flu vaccine and pneumonia vaccine at least every 5 years; moderate intensity exercise for up to 150 minutes weekly; and sleep for at least 7 hours a day.  - he is advised to maintain close follow up with Fanta, [name] Demissie, MD for primary care needs, as well as his other providers for optimal and coordinated care.     I spent  43  minutes in the care of the patient today including review of labs from CMP, Lipids, Thyroid  Function, Hematology (current and previous including abstractions from other facilities); face-to-face time discussing  his blood glucose readings/logs, discussing hypoglycemia and hyperglycemia episodes and symptoms, medications doses, his options of short and long term treatment based on the latest standards of care / guidelines;  discussion about incorporating lifestyle medicine;  and documenting the encounter. Risk reduction counseling performed per USPSTF guidelines to reduce obesity and cardiovascular risk factors.     Please refer to Patient Instructions for Blood Glucose Monitoring and Insulin /Medications Dosing Guide"  in media tab for additional information. Please  also refer to " Patient Self  Inventory" in the Media  tab for reviewed elements of pertinent patient history.  Nathaniel [name] participated in the discussions, expressed understanding, and voiced agreement with the above plans.  All questions were answered to his satisfaction. he is encouraged to contact clinic should he have any questions or concerns prior to his return visit.     Follow up plan: - Return in about 4 months (around 05/19/2024) for Diabetes F/U with A1c in office, No previsit labs, Bring meter and logs.    Hulon Magic, [facility] [address] [place], [place] [address] Phone: [phone] Fax: [phone]  01/18/2024, 3:51 PM

## 2024-05-23 ENCOUNTER — Encounter: Payer: Self-pay | Admitting: Nurse Practitioner

## 2024-05-23 ENCOUNTER — Ambulatory Visit (INDEPENDENT_AMBULATORY_CARE_PROVIDER_SITE_OTHER): Admitting: Nurse Practitioner

## 2024-05-23 VITALS — BP 130/100 | HR 65 | Ht 71.0 in | Wt 136.0 lb

## 2024-05-23 DIAGNOSIS — E1022 Type 1 diabetes mellitus with diabetic chronic kidney disease: Secondary | ICD-10-CM

## 2024-05-23 DIAGNOSIS — N184 Chronic kidney disease, stage 4 (severe): Secondary | ICD-10-CM | POA: Diagnosis not present

## 2024-05-23 DIAGNOSIS — Z794 Long term (current) use of insulin: Secondary | ICD-10-CM | POA: Diagnosis not present

## 2024-05-23 DIAGNOSIS — I10 Essential (primary) hypertension: Secondary | ICD-10-CM | POA: Diagnosis not present

## 2024-05-23 DIAGNOSIS — E7800 Pure hypercholesterolemia, unspecified: Secondary | ICD-10-CM

## 2024-05-23 LAB — POCT GLYCOSYLATED HEMOGLOBIN (HGB A1C): Hemoglobin A1C: 8.3 % — AB (ref 4.0–5.6)

## 2024-05-23 MED ORDER — DEXCOM G7 SENSOR MISC: 1.0000 | 3 refills | Status: DC

## 2024-05-23 NOTE — Progress Notes (Signed)
 Endocrinology Follow Up Note       05/23/2024, 5:26 PM   Subjective:    Patient ID: Nathaniel [name], male    DOB: 13-Nov-1985.  Nathaniel [name] is being seen in follow up after being seen in consultation for management of currently uncontrolled symptomatic diabetes requested by  Carlette [name], MD.   Past Medical History:  Diagnosis Date   Diabetes mellitus    lantus /novolog    Hyperlipidemia    Hypertension    Marijuana abuse    occaisionally   Noncompliance with medication regimen    Stroke (HCC) 2020   Tobacco abuse    5/day    Past Surgical History:  Procedure Laterality Date   AV FISTULA PLACEMENT Left 01/08/2021   Procedure: LEFT ARM ARTERIOVENOUS (AV) FISTULA CREATION;  Surgeon: Oris Krystal FALCON, MD;  Location: [facility];  Service: Vascular;  Laterality: Left;   EYE SURGERY      Social History   Socioeconomic History   Marital status: Single    Spouse name: Not on file   Number of children: Not on file   Years of education: Not on file   Highest education level: Not on file  Occupational History   Occupation: disabled  Tobacco Use   Smoking status: Every Day    Current packs/day: 0.50    Average packs/day: 0.5 packs/day for 8.0 years (4.0 ttl pk-yrs)    Types: Cigarettes   Smokeless tobacco: Never   Tobacco comments:    smoke 5-8 per day  Vaping Use   Vaping status: Never Used  Substance and Sexual Activity   Alcohol use: Never   Drug use: Yes    Types: Marijuana    Comment: last use yesterday, every other days    Sexual activity: Yes    Birth control/protection: Condom  Other Topics Concern   Not on file  Social History Narrative   Not on file   Social Drivers of Health   Financial Resource Strain: Low Risk  (01/17/2023)   Overall Financial Resource Strain (CARDIA)    Difficulty of Paying Living Expenses: Not hard at all  Food Insecurity: No Food Insecurity  (01/17/2023)   Hunger Vital Sign    Worried About Running Out of Food in the Last Year: Never true    Ran Out of Food in the Last Year: Never true  Transportation Needs: No Transportation Needs (01/17/2023)   PRAPARE - Administrator, Civil Service (Medical): No    Lack of Transportation (Non-Medical): No  Physical Activity: Not on file  Stress: Not on file  Social Connections: Not on file    Family History  Problem Relation Age of Onset   Diabetes Father    Kidney failure Father        last 4 mnths of life   Hypertension Mother    Diabetes Maternal Grandmother    Pancreatic cancer Maternal Grandfather    Diabetes Paternal Grandfather     Outpatient Encounter Medications as of 05/23/2024  Medication Sig   Continuous Blood Gluc Transmit (DEXCOM G6 TRANSMITTER) MISC Check sugar 4 times a day   Continuous Glucose Sensor (DEXCOM G7 SENSOR) MISC Inject 1 Application  into the skin as directed. Change sensor every 10 days as directed.   glucose blood (ONETOUCH ULTRA) test strip USE TO TEST BLOOD SUGAR 4 TIMES A DAY   insulin  aspart (NOVOLOG ) 100 UNIT/ML injection Use with Omnipod for TDD around 50 units daily   Insulin  Disposable Pump (OMNIPOD 5 DEXG7G6 PODS GEN 5) MISC Change pod every 72 hours   [DISCONTINUED] Continuous Blood Gluc Sensor (DEXCOM G6 SENSOR) MISC 1 each by Does not apply route every 14 (fourteen) days.   acetaminophen  (TYLENOL ) 325 MG tablet Take 1-2 tablets (325-650 mg total) by mouth every 4 (four) hours as needed for mild pain. (Patient not taking: Reported on 05/23/2024)   amLODipine  (NORVASC ) 10 MG tablet Take 1 tablet (10 mg total) by mouth daily. For blood pressure (Patient not taking: Reported on 05/23/2024)   ASPIRIN  LOW DOSE 81 MG EC tablet Take 81 mg by mouth every morning. (Patient not taking: Reported on 05/23/2024)   atorvastatin  (LIPITOR ) 80 MG tablet Take 1 tablet (80 mg total) by mouth every evening. For stroke prevention (Patient not taking: Reported  on 05/23/2024)   clopidogrel  (PLAVIX ) 75 MG tablet Take 1 tablet (75 mg total) by mouth daily. For stroke prevention (Patient not taking: Reported on 05/23/2024)   ferrous sulfate 325 (65 FE) MG tablet Take 325 mg by mouth daily with breakfast. (Patient not taking: Reported on 05/23/2024)   insulin  aspart (FIASP  FLEXTOUCH) 100 UNIT/ML FlexTouch Pen INJECT 2-5 UNITS INTO THE SKIN 3 (THREE) TIMES DAILY BEFORE MEALS. (Patient not taking: Reported on 05/23/2024)   Insulin  Disposable Pump (OMNIPOD 5 DEXG7G6 INTRO GEN 5) KIT Change pod every 72 hours (Patient not taking: Reported on 05/23/2024)   Insulin  Pen Needle (PEN NEEDLES 3/16) 31G X 5 MM MISC Four times day (Patient not taking: Reported on 05/23/2024)   labetalol  (NORMODYNE ) 100 MG tablet Take 1 tablet (100 mg total) by mouth 2 (two) times daily. For BP (Patient not taking: Reported on 05/23/2024)   Multiple Vitamin (MULTIVITAMIN) tablet Take 1 tablet by mouth daily. (Patient not taking: Reported on 05/23/2024)   Nutritional Supplements (GLUCERNA 1.5 CAL PO) Take 237 mLs by mouth daily. (Patient not taking: Reported on 05/23/2024)   polyethylene glycol (MIRALAX  / GLYCOLAX ) 17 g packet Take 17 g by mouth daily. (Patient not taking: Reported on 05/23/2024)   sodium zirconium cyclosilicate  (LOKELMA ) 10 g PACK packet Take 10 g by mouth daily. (Patient not taking: Reported on 05/23/2024)   Vitamin D , Ergocalciferol , (DRISDOL ) 1.25 MG (50000 UNIT) CAPS capsule Take 1 capsule (50,000 Units total) by mouth once a week. (Patient not taking: Reported on 05/23/2024)   No facility-administered encounter medications on file as of 05/23/2024.    ALLERGIES: Allergies  Allergen Reactions   Fructose Other (See Comments)    Increase of blood sugar     VACCINATION STATUS: Immunization History  Administered Date(s) Administered   Influenza,inj,Quad PF,6+ Mos 06/08/2015, 06/11/2019, 07/22/2020   Moderna Sars-Covid-2 Vaccination 12/13/2019, 01/15/2020   Pneumococcal  Polysaccharide-23 04/10/2013, 02/18/2018    Diabetes He presents for his follow-up diabetic visit. He has type 1 diabetes mellitus. Onset time: diagnosed at age 38. His disease course has been stable. Hypoglycemia symptoms include nervousness/anxiousness, seizures (mom reports seizure like activity when glucose drops really low), sweats and tremors. There are no diabetic associated symptoms. There are no hypoglycemic complications. Diabetic complications include a CVA, heart disease (had MI in the past) and nephropathy. (Multiple episodes of DKA, Gastroparesis) Risk factors for coronary artery disease include diabetes mellitus,  dyslipidemia, family history, male sex and hypertension. Current diabetic treatment includes insulin  pump. He is compliant with treatment most of the time (has not been using in automated mode recently due to CGM issues). His weight is decreasing steadily. He is following a generally unhealthy diet. When asked about meal planning, he reported none. He has not had a previous visit with a dietitian. He participates in exercise intermittently. (He presents today, accompanied by his mom, with his CGM and Omnipod showing no glucose readings.  He has had trouble getting his sensor to start when it was time to transition to a new transmitter.  They have been performing fingersticks in the interim but they did not show those to me today.  His POCT A1c today is 8.3%, essentially unchanged from previous visit.  ) An ACE inhibitor/angiotensin II receptor blocker is not being taken. He does not see a podiatrist.Eye exam is current.     Review of systems  Constitutional: + decreasing body weight, current Body mass index is 18.97 kg/m., no fatigue, no subjective hyperthermia, no subjective hypothermia Eyes: no blurry vision, no xerophthalmia ENT: no sore throat, no nodules palpated in throat, no dysphagia/odynophagia, no hoarseness Cardiovascular: no chest pain, no shortness of breath, no  palpitations, no leg swelling Respiratory: no cough, no shortness of breath Gastrointestinal: no nausea/vomiting/diarrhea Musculoskeletal: still has residual affects from previous CVA affecting mostly his right side Skin: no rashes, no hyperemia Neurological: no tremors, no numbness, no tingling, no dizziness Psychiatric: no depression, no anxiety  Objective:     BP (!) 130/100 (BP Location: Left Arm, Patient Position: Sitting) Comment: Retake BP left arm, patient states he has not been taking any meds  Pulse 65   Ht 5' 11 (1.803 m)   Wt 136 lb (61.7 kg)   BMI 18.97 kg/m   Wt Readings from Last 3 Encounters:  05/23/24 136 lb (61.7 kg)  01/18/24 140 lb (63.5 kg)  10/20/23 146 lb 6.4 oz (66.4 kg)     BP Readings from Last 3 Encounters:  05/23/24 (!) 130/100  01/18/24 116/76  10/20/23 116/72      Physical Exam- Limited  Constitutional:  Body mass index is 18.97 kg/m. , not in acute distress, normal state of mind Eyes:  EOMI, no exophthalmos Musculoskeletal: no gross deformities, strength intact in all four extremities, no gross restriction of joint movements Skin:  no rashes, no hyperemia Neurological: no tremor with outstretched hands  Diabetic Foot Exam - Simple   No data filed      CMP ( most recent) CMP     Component Value Date/Time   NA 134 (L) 01/14/2023 1852   K 5.5 (A) 05/30/2023 0000   CL 105 01/14/2023 1852   CO2 21 (L) 01/14/2023 1852   GLUCOSE 151 (H) 01/14/2023 1852   BUN 26 (A) 05/30/2023 0000   CREATININE 3.0 (A) 05/30/2023 0000   CREATININE 3.13 (H) 01/14/2023 1852   CALCIUM  9.4 01/14/2023 1852   PROT 8.0 01/14/2023 1852   ALBUMIN 3.8 01/14/2023 1852   AST 17 01/14/2023 1852   ALT 19 01/14/2023 1852   ALKPHOS 101 01/14/2023 1852   BILITOT 0.7 01/14/2023 1852   GFR 32.98 (L) 03/19/2020 1649   EGFR 27 05/30/2023 0000   GFRNONAA 25 (L) 01/14/2023 1852     Diabetic Labs (most recent): Lab Results  Component Value Date   HGBA1C 8.3 (A)  05/23/2024   HGBA1C 8.4 (A) 01/18/2024   HGBA1C 8.0 (A) 10/20/2023  Lipid Panel ( most recent) Lipid Panel     Component Value Date/Time   CHOL 204 (H) 05/18/2021 1632   TRIG 142.0 05/18/2021 1632   HDL 37.60 (L) 05/18/2021 1632   CHOLHDL 5 05/18/2021 1632   VLDL 28.4 05/18/2021 1632   LDLCALC 138 (H) 05/18/2021 1632      Lab Results  Component Value Date   TSH 0.762 07/14/2020   TSH 0.97 03/06/2019   TSH 1.810 02/20/2018   TSH 0.938 04/28/2010           Assessment & Plan:   1) 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  He has had trouble getting his sensor to start when it was time to transition to a new transmitter.  They have been performing fingersticks in the interim but they did not show those to me today.  His POCT A1c today is 8.3%, essentially unchanged from previous visit.    - HARDIN HARDENBROOK has currently uncontrolled symptomatic type 1 DM since 38 years of age.   -Recent labs reviewed.  - I had a long discussion with him about the progressive nature of diabetes and the pathology behind its complications. 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 These are all discussed in detail with him.  The following Lifestyle Medicine recommendations according to American College of Lifestyle Medicine [facility]) were discussed and offered to patient and he agrees to start the journey:  A. Whole Foods, Plant-based plate comprising of fruits and vegetables, plant-based proteins, whole-grain carbohydrates was discussed in detail with the patient.   A list for source of those nutrients were also provided to the patient.  Patient will use only water or unsweetened tea  for hydration. B.  The need to stay away from risky substances including alcohol, smoking; obtaining 7 to 9 hours of restorative sleep, at least 150 minutes of moderate intensity exercise weekly, the importance of healthy social connections,  and stress reduction techniques were discussed. C.  A full color page of Calorie density of various food groups per pound showing examples of each food groups was provided to the patient.  - Nutritional counseling repeated at each appointment due to patients tendency to fall back in to old habits.  - The patient admits there is a room for improvement in their diet and drink choices. -  Suggestion is made for the patient to avoid simple carbohydrates from their diet including Cakes, Sweet Desserts / Pastries, Ice Cream, Soda (diet and regular), Sweet Tea, Candies, Chips, Cookies, Sweet Pastries, Store Bought Juices, Alcohol in Excess of 1-2 drinks a day, Artificial Sweeteners, Coffee Creamer, and Sugar-free Products. This will help patient to have stable blood glucose profile and potentially avoid unintended weight gain.   - I encouraged the patient to switch to unprocessed or minimally processed complex starch and increased protein intake (animal or plant source), fruits, and vegetables.   - Patient is advised to stick to a routine mealtimes to eat 3 meals a day and avoid unnecessary snacks (to snack only to correct hypoglycemia).  - I have approached him with the following individualized plan to manage his diabetes and patient agrees:   -Based on no glucose readings to review,  no changes will be made to his Omnipod settings today for safety purposes.  I did give him and his mom Julias contact information to reach out about troubleshooting recent issues with G6 and Omnipod.  I also sent Recardo an email for her to follow up with them.    He is advised to continue monitoring blood glucose 4 times daily (using his CGM), and to call the clinic if he has readings  less than 70 or above 300 for 3 tests in a row.  I did sent in an upgrade to Dexcom G7 to [facility] pharmacy as that may minimize issues moving forward.  - he is warned not to take insulin  without proper monitoring per orders. - Adjustment parameters are given to him for hypo and hyperglycemia in writing.  -He is not a candidate for non insulin  therapies due to type 1 diagnosis.  - Specific targets for  A1c; LDL, HDL, and Triglycerides were discussed with the patient.  2) Blood Pressure /Hypertension:  his blood pressure is NOT controlled to target but he notes he has not been taking any of his medications recently due to nausea (suspect from MVI).   he is advised to restart/continue his current medications prescribed by his PCP.  3) Lipids/Hyperlipidemia:    There is no recent lipid panel to review.  he is advised to continue Lipitor  80 mg daily at bedtime.  Side effects and precautions discussed with him.  Will check lipid panel prior to next visit.  He notes he stopped taking his meds not too long ago due to nausea (suspect related to his MVI).  4)  Weight/Diet:  his Body mass index is 18.97 kg/m.  -  he is NOT a candidate for weight loss. I discussed with him the fact that loss of 5 - 10% of his  current body weight will have the most impact on his diabetes management.  Exercise, and detailed carbohydrates information provided  -  detailed on discharge instructions.  5) Chronic Care/Health Maintenance: -he is not on ACEI/ARB and is on Statin medications and is encouraged to initiate and continue to follow up with Ophthalmology, Dentist, Podiatrist at least yearly or according to recommendations, and advised to stay away from smoking. I have recommended yearly flu vaccine and pneumonia vaccine at least every 5 years; moderate intensity exercise for up to 150 minutes weekly; and sleep for at least 7 hours a day.  - he is advised to maintain close follow up with Fanta, [name] Demissie, MD for  primary care needs, as well as his other providers for optimal and coordinated care.     I spent  39  minutes in the care of the patient today including review of labs from CMP, Lipids, Thyroid  Function, Hematology (current and previous including abstractions from other facilities); face-to-face time discussing  his blood glucose readings/logs, discussing hypoglycemia and hyperglycemia episodes and symptoms, medications doses, his options of short and long term treatment based on the latest standards of care / guidelines;  discussion about incorporating lifestyle medicine;  and documenting the encounter. Risk reduction counseling performed per USPSTF guidelines to reduce obesity and cardiovascular risk factors.     Please refer to Patient Instructions for Blood Glucose Monitoring and Insulin /Medications Dosing Guide  in media tab for additional information. Please  also refer to  Patient Self Inventory in the Media  tab for reviewed elements of pertinent patient history.  Nathaniel [name] participated in the discussions, expressed understanding, and voiced agreement with the  above plans.  All questions were answered to his satisfaction. he is encouraged to contact clinic should he have any questions or concerns prior to his return visit.     Follow up plan: - Return in about 3 months (around 08/22/2024) for Diabetes F/U with A1c in office, No previsit labs, Bring meter and logs.   Benton Rio, [facility] [address] [place], [place] [address] Phone: [phone] Fax: [phone]  05/23/2024, 5:26 PM

## 2024-06-20 ENCOUNTER — Other Ambulatory Visit: Payer: Self-pay | Admitting: Nurse Practitioner

## 2024-08-10 ENCOUNTER — Encounter (HOSPITAL_COMMUNITY): Payer: Self-pay

## 2024-08-10 ENCOUNTER — Observation Stay (HOSPITAL_COMMUNITY)
Admission: EM | Admit: 2024-08-10 | Discharge: 2024-08-11 | Disposition: A | Discharge status: Home / Self Care | Attending: Internal Medicine | Admitting: Internal Medicine

## 2024-08-10 ENCOUNTER — Emergency Department (HOSPITAL_COMMUNITY)

## 2024-08-10 DIAGNOSIS — Z794 Long term (current) use of insulin: Secondary | ICD-10-CM | POA: Insufficient documentation

## 2024-08-10 DIAGNOSIS — I129 Hypertensive chronic kidney disease with stage 1 through stage 4 chronic kidney disease, or unspecified chronic kidney disease: Secondary | ICD-10-CM | POA: Diagnosis not present

## 2024-08-10 DIAGNOSIS — E875 Hyperkalemia: Secondary | ICD-10-CM | POA: Diagnosis not present

## 2024-08-10 DIAGNOSIS — E1010 Type 1 diabetes mellitus with ketoacidosis without coma: Principal | ICD-10-CM | POA: Insufficient documentation

## 2024-08-10 DIAGNOSIS — E1059 Type 1 diabetes mellitus with other circulatory complications: Secondary | ICD-10-CM | POA: Diagnosis not present

## 2024-08-10 DIAGNOSIS — D72829 Elevated white blood cell count, unspecified: Secondary | ICD-10-CM | POA: Insufficient documentation

## 2024-08-10 DIAGNOSIS — Z8673 Personal history of transient ischemic attack (TIA), and cerebral infarction without residual deficits: Secondary | ICD-10-CM | POA: Diagnosis not present

## 2024-08-10 DIAGNOSIS — N179 Acute kidney failure, unspecified: Secondary | ICD-10-CM | POA: Diagnosis not present

## 2024-08-10 DIAGNOSIS — W19XXXA Unspecified fall, initial encounter: Secondary | ICD-10-CM | POA: Insufficient documentation

## 2024-08-10 DIAGNOSIS — I10 Essential (primary) hypertension: Secondary | ICD-10-CM | POA: Diagnosis present

## 2024-08-10 DIAGNOSIS — N184 Chronic kidney disease, stage 4 (severe): Secondary | ICD-10-CM | POA: Insufficient documentation

## 2024-08-10 DIAGNOSIS — F17210 Nicotine dependence, cigarettes, uncomplicated: Secondary | ICD-10-CM | POA: Diagnosis not present

## 2024-08-10 DIAGNOSIS — Z79899 Other long term (current) drug therapy: Secondary | ICD-10-CM | POA: Diagnosis not present

## 2024-08-10 DIAGNOSIS — E871 Hypo-osmolality and hyponatremia: Secondary | ICD-10-CM | POA: Insufficient documentation

## 2024-08-10 DIAGNOSIS — Z91148 Patient's other noncompliance with medication regimen for other reason: Secondary | ICD-10-CM | POA: Insufficient documentation

## 2024-08-10 DIAGNOSIS — R739 Hyperglycemia, unspecified: Secondary | ICD-10-CM | POA: Diagnosis present

## 2024-08-10 DIAGNOSIS — E782 Mixed hyperlipidemia: Secondary | ICD-10-CM | POA: Diagnosis not present

## 2024-08-10 DIAGNOSIS — R1110 Vomiting, unspecified: Secondary | ICD-10-CM | POA: Diagnosis present

## 2024-08-10 DIAGNOSIS — E1051 Type 1 diabetes mellitus with diabetic peripheral angiopathy without gangrene: Secondary | ICD-10-CM | POA: Diagnosis present

## 2024-08-10 DIAGNOSIS — S0590XA Unspecified injury of unspecified eye and orbit, initial encounter: Secondary | ICD-10-CM | POA: Insufficient documentation

## 2024-08-10 DIAGNOSIS — N189 Chronic kidney disease, unspecified: Secondary | ICD-10-CM

## 2024-08-10 DIAGNOSIS — F17200 Nicotine dependence, unspecified, uncomplicated: Secondary | ICD-10-CM | POA: Diagnosis present

## 2024-08-10 DIAGNOSIS — E1110 Type 2 diabetes mellitus with ketoacidosis without coma: Secondary | ICD-10-CM | POA: Diagnosis present

## 2024-08-10 HISTORY — DX: Type 1 diabetes mellitus without complications: E10.9

## 2024-08-10 HISTORY — DX: Chronic kidney disease, stage 4 (severe): N18.4

## 2024-08-10 LAB — CBC WITH DIFFERENTIAL/PLATELET
Abs Immature Granulocytes: 0.06 K/uL (ref 0.00–0.07)
Basophils Absolute: 0.1 K/uL (ref 0.0–0.1)
Basophils Relative: 0 %
Eosinophils Absolute: 0 K/uL (ref 0.0–0.5)
Eosinophils Relative: 0 %
HCT: 38.5 % — ABNORMAL LOW (ref 39.0–52.0)
Hemoglobin: 12.2 g/dL — ABNORMAL LOW (ref 13.0–17.0)
Immature Granulocytes: 0 %
Lymphocytes Relative: 6 %
Lymphs Abs: 0.8 K/uL (ref 0.7–4.0)
MCH: 29.2 pg (ref 26.0–34.0)
MCHC: 31.7 g/dL (ref 30.0–36.0)
MCV: 92.1 fL (ref 80.0–100.0)
Monocytes Absolute: 0.5 K/uL (ref 0.1–1.0)
Monocytes Relative: 3 %
Neutro Abs: 13 K/uL — ABNORMAL HIGH (ref 1.7–7.7)
Neutrophils Relative %: 91 %
Platelets: 299 K/uL (ref 150–400)
RBC: 4.18 MIL/uL — ABNORMAL LOW (ref 4.22–5.81)
RDW: 15.4 % (ref 11.5–15.5)
WBC: 14.4 K/uL — ABNORMAL HIGH (ref 4.0–10.5)
nRBC: 0 % (ref 0.0–0.2)

## 2024-08-10 LAB — URINALYSIS, ROUTINE W REFLEX MICROSCOPIC
Bilirubin Urine: NEGATIVE
Glucose, UA: >=500 mg/dL — AB
Ketones, ur: 20 mg/dL — AB
Leukocytes,Ua: NEGATIVE
Nitrite: NEGATIVE
Protein, ur: 100 mg/dL — AB
Specific Gravity, Urine: 1.007 (ref 1.005–1.030)
pH: 5 (ref 5.0–8.0)

## 2024-08-10 LAB — COMPREHENSIVE METABOLIC PANEL WITH GFR
ALT: 19 U/L (ref 0–44)
AST: 20 U/L (ref 15–41)
Albumin: 4.5 g/dL (ref 3.5–5.0)
Alkaline Phosphatase: 156 U/L — ABNORMAL HIGH (ref 38–126)
BUN: 80 mg/dL — ABNORMAL HIGH (ref 6–20)
CO2: <7 mmol/L — ABNORMAL LOW (ref 22–32)
Calcium: 8.7 mg/dL — ABNORMAL LOW (ref 8.9–10.3)
Chloride: 76 mmol/L — ABNORMAL LOW (ref 98–111)
Creatinine, Ser: 7.09 mg/dL — ABNORMAL HIGH (ref 0.61–1.24)
GFR, Estimated: 9 mL/min — ABNORMAL LOW (ref >60)
Glucose, Bld: 949 mg/dL (ref 70–99)
Potassium: 5.5 mmol/L — ABNORMAL HIGH (ref 3.5–5.1)
Sodium: 121 mmol/L — ABNORMAL LOW (ref 135–145)
Total Bilirubin: 0.4 mg/dL (ref 0.0–1.2)
Total Protein: 8.1 g/dL (ref 6.5–8.1)

## 2024-08-10 LAB — BASIC METABOLIC PANEL WITH GFR
Anion gap: 24 — ABNORMAL HIGH (ref 5–15)
BUN: 77 mg/dL — ABNORMAL HIGH (ref 6–20)
BUN: 74 mg/dL — ABNORMAL HIGH (ref 6–20)
CO2: <7 mmol/L — ABNORMAL LOW (ref 22–32)
CO2: 12 mmol/L — ABNORMAL LOW (ref 22–32)
Calcium: 7.8 mg/dL — ABNORMAL LOW (ref 8.9–10.3)
Calcium: 7.9 mg/dL — ABNORMAL LOW (ref 8.9–10.3)
Chloride: 83 mmol/L — ABNORMAL LOW (ref 98–111)
Chloride: 94 mmol/L — ABNORMAL LOW (ref 98–111)
Creatinine, Ser: 6.51 mg/dL — ABNORMAL HIGH (ref 0.61–1.24)
Creatinine, Ser: 6.48 mg/dL — ABNORMAL HIGH (ref 0.61–1.24)
GFR, Estimated: 10 mL/min — ABNORMAL LOW (ref >60)
GFR, Estimated: 11 mL/min — ABNORMAL LOW (ref >60)
Glucose, Bld: 794 mg/dL (ref 70–99)
Glucose, Bld: 422 mg/dL — ABNORMAL HIGH (ref 70–99)
Potassium: 4.8 mmol/L (ref 3.5–5.1)
Potassium: 3.6 mmol/L (ref 3.5–5.1)
Sodium: 124 mmol/L — ABNORMAL LOW (ref 135–145)
Sodium: 130 mmol/L — ABNORMAL LOW (ref 135–145)

## 2024-08-10 LAB — I-STAT CHEM 8, ED
BUN: 75 mg/dL — ABNORMAL HIGH (ref 6–20)
Calcium, Ion: 1.04 mmol/L — ABNORMAL LOW (ref 1.15–1.40)
Chloride: 93 mmol/L — ABNORMAL LOW (ref 98–111)
Creatinine, Ser: 8.4 mg/dL — ABNORMAL HIGH (ref 0.61–1.24)
Glucose, Bld: >700 mg/dL (ref 70–99)
HCT: 41 % (ref 39.0–52.0)
Hemoglobin: 13.9 g/dL (ref 13.0–17.0)
Potassium: 5.4 mmol/L — ABNORMAL HIGH (ref 3.5–5.1)
Sodium: 120 mmol/L — ABNORMAL LOW (ref 135–145)
TCO2: 7 mmol/L — ABNORMAL LOW (ref 22–32)

## 2024-08-10 LAB — CBG MONITORING, ED
Glucose-Capillary: >600 mg/dL (ref 70–99)
Glucose-Capillary: >600 mg/dL (ref 70–99)
Glucose-Capillary: >600 mg/dL (ref 70–99)
Glucose-Capillary: >600 mg/dL (ref 70–99)
Glucose-Capillary: >600 mg/dL (ref 70–99)
Glucose-Capillary: >600 mg/dL (ref 70–99)
Glucose-Capillary: 492 mg/dL — ABNORMAL HIGH (ref 70–99)
Glucose-Capillary: 574 mg/dL (ref 70–99)

## 2024-08-10 LAB — BLOOD GAS, VENOUS
Acid-base deficit: 23.5 mmol/L — ABNORMAL HIGH (ref 0.0–2.0)
Acid-base deficit: 21.2 mmol/L — ABNORMAL HIGH (ref 0.0–2.0)
Bicarbonate: 5.3 mmol/L — ABNORMAL LOW (ref 20.0–28.0)
Bicarbonate: 6 mmol/L — ABNORMAL LOW (ref 20.0–28.0)
Drawn by: 69260
Drawn by: 27160
O2 Saturation: 71.6 %
O2 Saturation: 77.6 %
Patient temperature: 36.6
Patient temperature: 37.2
pCO2, Ven: 19 mmHg — CL (ref 44–60)
pCO2, Ven: 18 mmHg — CL (ref 44–60)
pH, Ven: 7.05 — CL (ref 7.25–7.43)
pH, Ven: 7.13 — CL (ref 7.25–7.43)
pO2, Ven: 43 mmHg (ref 32–45)
pO2, Ven: 49 mmHg — ABNORMAL HIGH (ref 32–45)

## 2024-08-10 LAB — LIPASE, BLOOD: Lipase: 70 U/L — ABNORMAL HIGH (ref 11–51)

## 2024-08-10 LAB — LACTIC ACID, PLASMA
Lactic Acid, Venous: 3 mmol/L (ref 0.5–1.9)
Lactic Acid, Venous: 3 mmol/L (ref 0.5–1.9)

## 2024-08-10 LAB — GLUCOSE, CAPILLARY
Glucose-Capillary: 172 mg/dL — ABNORMAL HIGH (ref 70–99)
Glucose-Capillary: 234 mg/dL — ABNORMAL HIGH (ref 70–99)
Glucose-Capillary: 303 mg/dL — ABNORMAL HIGH (ref 70–99)
Glucose-Capillary: 368 mg/dL — ABNORMAL HIGH (ref 70–99)
Glucose-Capillary: 439 mg/dL — ABNORMAL HIGH (ref 70–99)
Glucose-Capillary: 441 mg/dL — ABNORMAL HIGH (ref 70–99)

## 2024-08-10 LAB — MRSA NEXT GEN BY PCR, NASAL: MRSA by PCR Next Gen: NOT DETECTED

## 2024-08-10 LAB — BETA-HYDROXYBUTYRIC ACID: Beta-Hydroxybutyric Acid: >8 mmol/L — ABNORMAL HIGH (ref 0.05–0.27)

## 2024-08-10 LAB — MAGNESIUM: Magnesium: 1.9 mg/dL (ref 1.7–2.4)

## 2024-08-10 LAB — PHOSPHORUS: Phosphorus: 5.7 mg/dL — ABNORMAL HIGH (ref 2.5–4.6)

## 2024-08-10 MED ORDER — LABETALOL HCL 200 MG PO TABS
100.0000 mg | ORAL_TABLET | Freq: Two times a day (BID) | ORAL | Status: DC
  Administered 2024-08-10 – 2024-08-11 (×2): 100 mg via ORAL
  Filled 2024-08-10 (×2): qty 1

## 2024-08-10 MED ORDER — AMLODIPINE BESYLATE 5 MG PO TABS
10.0000 mg | ORAL_TABLET | ORAL | Status: AC
  Administered 2024-08-10: 10 mg via ORAL

## 2024-08-10 MED ORDER — HYDRALAZINE HCL 20 MG/ML IJ SOLN: 10.0000 mg | Freq: Four times a day (QID) | INTRAMUSCULAR | Status: DC | PRN

## 2024-08-10 MED ORDER — HYDRALAZINE HCL 20 MG/ML IJ SOLN
10.0000 mg | INTRAMUSCULAR | Status: DC | PRN
  Administered 2024-08-10: 10 mg via INTRAVENOUS
  Filled 2024-08-10: qty 1

## 2024-08-10 MED ORDER — LACTATED RINGERS IV BOLUS
1000.0000 mL | Freq: Once | INTRAVENOUS | Status: AC
  Administered 2024-08-10: 1000 mL via INTRAVENOUS

## 2024-08-10 MED ORDER — HYDRALAZINE HCL 20 MG/ML IJ SOLN
10.0000 mg | Freq: Once | INTRAMUSCULAR | Status: DC
  Filled 2024-08-10: qty 1

## 2024-08-10 MED ORDER — ATORVASTATIN CALCIUM 40 MG PO TABS
80.0000 mg | ORAL_TABLET | Freq: Every evening | ORAL | Status: DC
  Administered 2024-08-10 – 2024-08-11 (×2): 80 mg via ORAL
  Filled 2024-08-10 (×2): qty 2

## 2024-08-10 MED ORDER — INSULIN REGULAR BOLUS VIA INFUSION
3.0000 [IU] | Freq: Once | INTRAVENOUS | Status: AC
  Administered 2024-08-10: 3 [IU] via INTRAVENOUS
  Filled 2024-08-10: qty 3

## 2024-08-10 MED ORDER — INSULIN REGULAR BOLUS VIA INFUSION
4.0000 [IU] | Freq: Once | INTRAVENOUS | Status: AC
  Administered 2024-08-10: 4 [IU] via INTRAVENOUS

## 2024-08-10 MED ORDER — AMLODIPINE BESYLATE 5 MG PO TABS
10.0000 mg | ORAL_TABLET | Freq: Every day | ORAL | Status: DC
  Administered 2024-08-11: 10 mg via ORAL
  Filled 2024-08-10: qty 2

## 2024-08-10 MED ORDER — CHLORHEXIDINE GLUCONATE CLOTH 2 % EX PADS
6.0000 | MEDICATED_PAD | Freq: Every day | CUTANEOUS | Status: DC
  Administered 2024-08-10 – 2024-08-11 (×2): 6 via TOPICAL

## 2024-08-10 MED ORDER — DEXTROSE 50 % IV SOLN: 0.0000 mL | INTRAVENOUS | Status: DC | PRN

## 2024-08-10 MED ORDER — ONDANSETRON HCL 4 MG/2ML IJ SOLN
4.0000 mg | Freq: Once | INTRAMUSCULAR | Status: AC
  Administered 2024-08-10: 4 mg via INTRAVENOUS
  Filled 2024-08-10: qty 2

## 2024-08-10 MED ORDER — LACTATED RINGERS IV BOLUS: 1000.0000 mL | Freq: Once | INTRAVENOUS | Status: DC

## 2024-08-10 MED ORDER — PANTOPRAZOLE SODIUM 40 MG PO TBEC
40.0000 mg | DELAYED_RELEASE_TABLET | Freq: Every day | ORAL | Status: DC
  Administered 2024-08-10 – 2024-08-11 (×2): 40 mg via ORAL
  Filled 2024-08-10 (×2): qty 1

## 2024-08-10 MED ORDER — AMLODIPINE BESYLATE 5 MG PO TABS
10.0000 mg | ORAL_TABLET | Freq: Once | ORAL | Status: DC
  Filled 2024-08-10: qty 2

## 2024-08-10 MED ORDER — LABETALOL HCL 200 MG PO TABS
200.0000 mg | ORAL_TABLET | Freq: Once | ORAL | Status: AC
  Administered 2024-08-10: 200 mg via ORAL

## 2024-08-10 MED ORDER — LABETALOL HCL 200 MG PO TABS
200.0000 mg | ORAL_TABLET | Freq: Once | ORAL | Status: DC
  Filled 2024-08-10: qty 1

## 2024-08-10 MED ORDER — LACTATED RINGERS IV SOLN: INTRAVENOUS | Status: DC

## 2024-08-10 MED ORDER — INSULIN REGULAR(HUMAN) IN NACL 100-0.9 UT/100ML-% IV SOLN
INTRAVENOUS | Status: DC
  Administered 2024-08-10: 5.5 [IU]/h via INTRAVENOUS
  Filled 2024-08-10: qty 100

## 2024-08-10 MED ORDER — SODIUM CHLORIDE 0.9 % IV BOLUS
1000.0000 mL | Freq: Once | INTRAVENOUS | Status: AC
  Administered 2024-08-10: 1000 mL via INTRAVENOUS

## 2024-08-10 MED ORDER — ASPIRIN 81 MG PO TBEC
81.0000 mg | DELAYED_RELEASE_TABLET | Freq: Every morning | ORAL | Status: DC
  Administered 2024-08-11: 81 mg via ORAL
  Filled 2024-08-10: qty 1

## 2024-08-10 MED ORDER — DEXTROSE IN LACTATED RINGERS 5 % IV SOLN: INTRAVENOUS | Status: DC

## 2024-08-10 MED ORDER — CLOPIDOGREL BISULFATE 75 MG PO TABS
75.0000 mg | ORAL_TABLET | Freq: Every day | ORAL | Status: DC
  Administered 2024-08-11: 75 mg via ORAL
  Filled 2024-08-10: qty 1

## 2024-08-10 NOTE — ED Notes (Addendum)
 Condom cath placed.

## 2024-08-10 NOTE — ED Notes (Addendum)
 MD at bedside. Ordered a verbal bolus of 4 Units of insulin .

## 2024-08-10 NOTE — ED Notes (Signed)
 Patient transported to CT

## 2024-08-10 NOTE — ED Provider Notes (Signed)
 [place] EMERGENCY DEPARTMENT AT [facility] Provider Note   CSN: [id number] Arrival date & time: 08/10/24  1354     Patient presents with: Nausea, Emesis, Weakness, and Hyperglycemia (Over 600)   Nathaniel [name] is a 38 y.o. male.   Patient is a 38 year old male with a past medical history of type 1 diabetes who presents to the emergency department secondary to abdominal pain, nausea, vomiting, generalized weakness and elevated blood sugar.  Patient notes that this has been ongoing for multiple days.  Patient notes that his Dexcom has been malfunctioning and he is unsure of exactly what his sugars been but the most recent reading was over 600.  Patient does administer insulin .  Patient denies any associated chest pain, shortness of breath.  He still makes urine and is not currently on dialysis and has been following with his nephrologist.   Emesis Associated symptoms: abdominal pain   Weakness Associated symptoms: abdominal pain, nausea and vomiting   Hyperglycemia Associated symptoms: abdominal pain, nausea, vomiting and weakness        Prior to Admission medications   Medication Sig Start Date End Date Taking? Authorizing Provider  acetaminophen  (TYLENOL ) 325 MG tablet Take 1-2 tablets (325-650 mg total) by mouth every 4 (four) hours as needed for mild pain. Patient not taking: Reported on 05/23/2024 03/07/18   Love, Sharlet RAMAN, PA-C  amLODipine  (NORVASC ) 10 MG tablet Take 1 tablet (10 mg total) by mouth daily. For blood pressure Patient not taking: Reported on 05/23/2024 07/16/20   Pearlean Manus, MD  ASPIRIN  LOW DOSE 81 MG EC tablet Take 81 mg by mouth every morning. Patient not taking: Reported on 05/23/2024 10/21/21   [provider]  atorvastatin  (LIPITOR ) 80 MG tablet Take 1 tablet (80 mg total) by mouth every evening. For stroke prevention Patient not taking: Reported on 05/23/2024 07/16/20   Pearlean Manus, MD  clopidogrel  (PLAVIX ) 75 MG tablet Take 1  tablet (75 mg total) by mouth daily. For stroke prevention Patient not taking: Reported on 05/23/2024 07/16/20   Pearlean Manus, MD  Continuous Blood Gluc Transmit (DEXCOM G6 TRANSMITTER) MISC Check sugar 4 times a day 07/21/21   [place]on [place], MD  Continuous Glucose Sensor (DEXCOM G7 SENSOR) MISC Inject 1 Application into the skin as directed. Change sensor every 10 days as directed. 05/23/24   Therisa Benton PARAS, NP  ferrous sulfate 325 (65 FE) MG tablet Take 325 mg by mouth daily with breakfast. Patient not taking: Reported on 05/23/2024    [provider]  glucose blood (ONETOUCH ULTRA) test strip USE TO TEST BLOOD SUGAR 4 TIMES A DAY 12/14/22   [place]on [place], MD  insulin  aspart (FIASP  FLEXTOUCH) 100 UNIT/ML FlexTouch Pen INJECT 2-5 UNITS INTO THE SKIN 3 (THREE) TIMES DAILY BEFORE MEALS. Patient not taking: Reported on 05/23/2024 12/13/23   Therisa Benton PARAS, NP  insulin  aspart (NOVOLOG ) 100 UNIT/ML injection Use with Omnipod for TDD around 50 units daily 11/15/23   Therisa Benton PARAS, NP  Insulin  Disposable Pump (OMNIPOD 5 DEXG7G6 INTRO GEN 5) KIT Change pod every 72 hours Patient not taking: Reported on 05/23/2024 07/19/23   Therisa Benton PARAS, NP  Insulin  Disposable Pump (OMNIPOD 5 DEXG7G6 PODS GEN 5) MISC CHANGE POD EVERY 72 HOURS 06/20/24   Therisa Benton PARAS, NP  Insulin  Pen Needle (PEN NEEDLES 3/16) 31G X 5 MM MISC Four times day Patient not taking: Reported on 05/23/2024 01/25/23   [place]on [place], MD  labetalol  (NORMODYNE ) 100 MG tablet Take 1  tablet (100 mg total) by mouth 2 (two) times daily. For BP Patient not taking: Reported on 05/23/2024 07/16/20   Pearlean Manus, MD  Multiple Vitamin (MULTIVITAMIN) tablet Take 1 tablet by mouth daily. Patient not taking: Reported on 05/23/2024    [provider]  NOVOLOG  FLEXPEN 100 UNIT/ML FlexPen Inject 2-5 Units into the skin 3 (three) times daily with meals. 06/20/24   [provider]  Nutritional Supplements (GLUCERNA 1.5 CAL PO) Take  237 mLs by mouth daily. Patient not taking: Reported on 05/23/2024    [provider]  polyethylene glycol (MIRALAX  / GLYCOLAX ) 17 g packet Take 17 g by mouth daily. Patient not taking: Reported on 05/23/2024    [provider]  sodium zirconium cyclosilicate  (LOKELMA ) 10 g PACK packet Take 10 g by mouth daily. Patient not taking: Reported on 05/23/2024 10/25/22   Bryn Bernardino NOVAK, MD  VELTASSA 8.4 g packet Take 1 packet by mouth daily. 06/20/24   [provider]  Vitamin D , Ergocalciferol , (DRISDOL ) 1.25 MG (50000 UNIT) CAPS capsule Take 1 capsule (50,000 Units total) by mouth once a week. Patient not taking: Reported on 05/23/2024 07/16/20   Pearlean Manus, MD    Allergies: Fructose    Review of Systems  Gastrointestinal:  Positive for abdominal pain, nausea and vomiting.  Neurological:  Positive for weakness.  All other systems reviewed and are negative.   Updated Vital Signs Ht 5' 11 (1.803 m)   Wt 61.7 kg   BMI 18.97 kg/m   Physical Exam Vitals and nursing note reviewed.  Constitutional:      General: He is not in acute distress.    Appearance: Normal appearance. He is not ill-appearing.  HENT:     Head: Normocephalic and atraumatic.     Nose: Nose normal.     Mouth/Throat:     Mouth: Mucous membranes are moist.  Eyes:     Extraocular Movements: Extraocular movements intact.     Conjunctiva/sclera: Conjunctivae normal.     Pupils: Pupils are equal, round, and reactive to light.  Cardiovascular:     Rate and Rhythm: Regular rhythm. Tachycardia present.     Pulses: Normal pulses.     Heart sounds: Normal heart sounds. No murmur heard. Pulmonary:     Effort: Pulmonary effort is normal. No respiratory distress.     Breath sounds: Normal breath sounds. No stridor. No wheezing, rhonchi or rales.  Abdominal:     General: Abdomen is flat. Bowel sounds are normal. There is no distension.     Palpations: Abdomen is soft.     Tenderness: There is abdominal  tenderness. There is no guarding.  Musculoskeletal:        General: Normal range of motion.     Cervical back: Normal range of motion and neck supple. No rigidity or tenderness.     Right lower leg: No edema.     Left lower leg: No edema.  Skin:    General: Skin is warm and dry.     Findings: No bruising or rash.  Neurological:     General: No focal deficit present.     Mental Status: He is alert and oriented to person, place, and time. Mental status is at baseline.     Cranial Nerves: No cranial nerve deficit.     Sensory: No sensory deficit.     Motor: No weakness.     Coordination: Coordination normal.  Psychiatric:        Mood and Affect: Mood  normal.        Behavior: Behavior normal.        Thought Content: Thought content normal.        Judgment: Judgment normal.     (all labs ordered are listed, but only abnormal results are displayed) Labs Reviewed  CBG MONITORING, ED - Abnormal; Notable for the following components:      Result Value   Glucose-Capillary >600 (*)    All other components within normal limits  CULTURE, BLOOD (ROUTINE X 2)  CULTURE, BLOOD (ROUTINE X 2)  COMPREHENSIVE METABOLIC PANEL WITH GFR  URINALYSIS, ROUTINE W REFLEX MICROSCOPIC  CBC WITH DIFFERENTIAL/PLATELET  BETA-HYDROXYBUTYRIC ACID  BLOOD GAS, VENOUS  LIPASE, BLOOD  LACTIC ACID, PLASMA  LACTIC ACID, PLASMA  I-STAT CHEM 8, ED    EKG: None  Radiology: No results found.   .Critical Care  Performed by: Daralene Lonni BIRCH, PA-C Authorized by: Daralene Lonni BIRCH, PA-C   Critical care provider statement:    Critical care time (minutes):  45   Critical care was necessary to treat or prevent imminent or life-threatening deterioration of the following conditions:  Renal failure (DKA)   Critical care was time spent personally by me on the following activities:  Development of treatment plan with patient or surrogate, discussions with consultants, evaluation of patient's response to  treatment, examination of patient, ordering and review of laboratory studies, ordering and review of radiographic studies, ordering and performing treatments and interventions, pulse oximetry, re-evaluation of patient's condition and review of old charts   I assumed direction of critical care for this patient from another provider in my specialty: no     Care discussed with: admitting provider      Medications Ordered in the ED  lactated ringers  bolus 1,000 mL (has no administration in time range)  lactated ringers  bolus 1,000 mL (has no administration in time range)  ondansetron  (ZOFRAN ) injection 4 mg (has no administration in time range)                                    Medical Decision Making Amount and/or Complexity of Data Reviewed Labs: ordered. Radiology: ordered.  Risk Prescription drug management. Decision regarding hospitalization.   This patient presents to the ED for concern of weakness, nausea, vomiting, hyperglycemia, this involves an extensive number of treatment options, and is a complaint that carries with it a high risk of complications and morbidity.  The differential diagnosis includes DKA, HHS, electrolyte derangement, kidney failure, acute appendicitis, cholecystitis, small bowel obstruction, diverticulitis, pyelonephritis, kidney stone, pancreatitis, mesenteric ischemia   Co morbidities that complicate the patient evaluation  Type 1 diabetes, hypertension, hyperlipidemia, stage IV kidney disease   Additional history obtained:  Additional history obtained from family External records from outside source obtained and reviewed including medical records   Lab Tests:  I Ordered, and personally interpreted labs.  The pertinent results include: Leukocytosis, anemia, hyperglycemia, low bicarb, pseudohyponatremia, hyperkalemia, elevated lactic acid, pH of 7.05, elevated lipase   Imaging Studies ordered:  I ordered imaging studies including chest x-ray, CT  scan abdomen and pelvis I independently visualized and interpreted imaging which showed no acute cardiopulmonary process, no acute intra-abdominal surgical process I agree with the radiologist interpretation   Cardiac Monitoring: / EKG:  The patient was maintained on a cardiac monitor.  I personally viewed and interpreted the cardiac monitored which showed an underlying rhythm of: Sinus tachycardia, no  STEMI, nonspecific T wave and ST changes   Consultations Obtained:  I requested consultation with the hospitalist,  and discussed lab and imaging findings as well as pertinent plan - they recommend: Admission   Problem List / ED Course / Critical interventions / Medication management  Patient is doing better at this time.  Tachycardia is improving at this point.  He does meet criteria for DKA.  No other obvious infectious source is noted at this point.  He has been started on IV fluids as well as insulin  drip with improvement in his blood work.  Have discussed patient case with Dr. Sherlon with the hospitalist service who has excepted for admission. I ordered medication including IV fluids, insulin , Zofran  for DKA, nausea and vomiting Reevaluation of the patient after these medicines showed that the patient improved I have reviewed the patients home medicines and have made adjustments as needed   Social Determinants of Health:  None   Test / Admission - Considered:  Admission     Final diagnoses:  None    ED Discharge Orders     None          Daralene Lonni JONETTA DEVONNA 08/10/24 1856    Dean Clarity, MD 08/15/24 [phone]

## 2024-08-10 NOTE — H&P (Signed)
 [facility] H&P   Patient Demographics:    Nathaniel [name], is a 38 y.o. male  MRN: [id number]   DOB - 11-Aug-1986  Admit Date - 08/10/2024  Outpatient Primary MD for the patient is Carlette [name], MD  Referring MD/NP/PA: PA Rigney  Patient coming from: Home  Chief Complaint  Patient presents with   Nausea   Emesis   Weakness   Hyperglycemia    Over 600      HPI:    Nathaniel [name]  is a 38 y.o. male, with past medical history of type 1 diabetes mellitus, on insulin  pump at home, history of cardiac arrest in the past due to DKA/electrolyte derangement, necrotizing fasciitis last year, history of paresis,C Kd stage IIIb, hyperlipidemia, hypertension, CVA. - Patient comes to ED due to nausea, vomiting and hyperglycemia, patient reports his Dexcom has been malfunctioning for last few days where he could not get an accurate readings, he is not sure about his Omni pump, but reports it has been working okay but he has not been giving himself bolus insulin  and he has not been feeling like eating.  He reports the most recent readings were over 600s he denies any chest pain, shortness of breath. - In ED labs significant for pH of 7.05, sodium 121, potassium 5.5, blood glucose 949, BUN of 80, creatinine of 7.09, Kos of 156, lactic acid elevated at 3, white blood cell count of 14.4, hemoglobin at 12.2, CT chest/abdomen/pelvis significant for bilateral renal atrophy, mildly distended stomach, otherwise no acute findings, tried hospitalist consulted to admit for severe DKA.    Review of systems:     A full 10 point Review of Systems was done, except as stated above, all other Review of Systems were negative.   With Past History of the following :    Past Medical History:  Diagnosis Date   Diabetes mellitus Type 1    lantus /novolog    Hyperlipidemia    Hypertension    Marijuana  abuse    occaisionally   Noncompliance with medication regimen    Stage 4 chronic kidney disease (HCC)    Stroke (HCC) 2020   Tobacco abuse    5/day      Past Surgical History:  Procedure Laterality Date   AV FISTULA PLACEMENT Left 01/08/2021   Procedure: LEFT ARM ARTERIOVENOUS (AV) FISTULA CREATION;  Surgeon: Oris Krystal FALCON, MD;  Location: [facility];  Service: Vascular;  Laterality: Left;   EYE SURGERY        Social History:     Social History   Tobacco Use   Smoking status: Every Day    Current packs/day: 0.50    Average packs/day: 0.5 packs/day for 8.0 years (4.0 ttl pk-yrs)    Types: Cigarettes   Smokeless tobacco: Never   Tobacco comments:    smoke 5-8 per day  Substance Use Topics   Alcohol use:  Never      Family History :     Family History  Problem Relation Age of Onset   Diabetes Father    Kidney failure Father        last 4 mnths of life   Hypertension Mother    Diabetes Maternal Grandmother    Pancreatic cancer Maternal Grandfather    Diabetes Paternal Grandfather       Home Medications:   Prior to Admission medications   Medication Sig Start Date End Date Taking? Authorizing Provider  amLODipine  (NORVASC ) 10 MG tablet Take 1 tablet (10 mg total) by mouth daily. For blood pressure 07/16/20  Yes Emokpae, Courage, MD  ASPIRIN  LOW DOSE 81 MG EC tablet Take 81 mg by mouth every morning. 10/21/21  Yes [provider]  atorvastatin  (LIPITOR ) 80 MG tablet Take 1 tablet (80 mg total) by mouth every evening. For stroke prevention 07/16/20  Yes Emokpae, Courage, MD  clopidogrel  (PLAVIX ) 75 MG tablet Take 1 tablet (75 mg total) by mouth daily. For stroke prevention 07/16/20  Yes Pearlean Manus, MD  insulin  aspart (NOVOLOG ) 100 UNIT/ML injection Use with Omnipod for TDD around 50 units daily Patient taking differently: Inject 100 Units into the skin every 3 (three) days. Use with Omnipod for TDD around 50 units daily 11/15/23  Yes Reardon, Benton PARAS, NP   labetalol  (NORMODYNE ) 100 MG tablet Take 1 tablet (100 mg total) by mouth 2 (two) times daily. For BP 07/16/20  Yes Emokpae, Courage, MD  NOVOLOG  FLEXPEN 100 UNIT/ML FlexPen Inject 2-5 Units into the skin as needed (when Omnipod is not working). 06/20/24  Yes [provider]  VELTASSA 8.4 g packet Take 1 packet by mouth 3 (three) times a week. 06/20/24  Yes [provider]  VITAMIN D  PO Take 25,000 Units by mouth once a week.   Yes [provider]  Continuous Blood Gluc Transmit (DEXCOM G6 TRANSMITTER) MISC Check sugar 4 times a day 07/21/21   Von [place], MD  Continuous Glucose Sensor (DEXCOM G7 SENSOR) MISC Inject 1 Application into the skin as directed. Change sensor every 10 days as directed. 05/23/24   Therisa Benton PARAS, NP  glucose blood (ONETOUCH ULTRA) test strip USE TO TEST BLOOD SUGAR 4 TIMES A DAY 12/14/22   Von [place], MD  Insulin  Disposable Pump (OMNIPOD 5 DEXG7G6 INTRO GEN 5) KIT Change pod every 72 hours 07/19/23   Therisa Benton PARAS, NP  Insulin  Disposable Pump (OMNIPOD 5 DEXG7G6 PODS GEN 5) MISC CHANGE POD EVERY 72 HOURS 06/20/24   Therisa Benton PARAS, NP  Insulin  Pen Needle (PEN NEEDLES 3/16) 31G X 5 MM MISC Four times day 01/25/23   Von [place], MD     Allergies:     Allergies  Allergen Reactions   Fructose Other (See Comments)    Increase of blood sugar Orange juice      Physical Exam:   Vitals  Blood pressure (!) 193/127, pulse (!) 133, temperature 98.9 F (37.2 C), temperature source Oral, resp. rate (!) 22, height 5' 11 (1.803 m), weight 61.7 kg, SpO2 100%.   1. General.  Male, cachectic, laying in bed  2.,  Awake, alert, oriented x 3  3. No F.N deficits, ALL C.Nerves Intact, Strength 5/5 all 4 extremities, Sensation intact all 4 extremities, Plantars down going.  4. Ears and Eyes appear Normal, Conjunctivae clear, PERRLA.  Dry oral mucosa  5. Supple Neck,  No Carotid Bruits.  6. Symmetrical Chest wall movement, Good air movement  bilaterally, tachypneic  7.  Tachycardic, No Gallops, Rubs or Murmurs, No Parasternal Heave.  8. Positive Bowel Sounds, Abdomen Soft, No tenderness, No organomegaly appriciated,No rebound -guarding or rigidity.  9.  No Cyanosis, slow skin Turgor, No Skin Rash or Bruise.  10. Good muscle tone,  joints appear normal , no effusions, Normal ROM.     Data Review:    CBC Recent Labs  Lab 08/10/24 1437 08/10/24 1440  WBC 14.4*  --   HGB 12.2* 13.9  HCT 38.5* 41.0  PLT 299  --   MCV 92.1  --   MCH 29.2  --   MCHC 31.7  --   RDW 15.4  --   LYMPHSABS 0.8  --   MONOABS 0.5  --   EOSABS 0.0  --   BASOSABS 0.1  --    ------------------------------------------------------------------------------------------------------------------  Chemistries  Recent Labs  Lab 08/10/24 1437 08/10/24 1440  NA 121* 120*  K 5.5* 5.4*  CL 76* 93*  CO2 <7*  --   GLUCOSE 949* >700*  BUN 80* 75*  CREATININE 7.09* 8.40*  CALCIUM  8.7*  --   AST 20  --   ALT 19  --   ALKPHOS 156*  --   BILITOT 0.4  --    ------------------------------------------------------------------------------------------------------------------ estimated creatinine clearance is 10.5 mL/min (A) (by C-G formula based on SCr of 8.4 mg/dL (H)). ------------------------------------------------------------------------------------------------------------------ No results for input(s): TSH, T4TOTAL, T3FREE, THYROIDAB in the last 72 hours.  Invalid input(s): FREET3  Coagulation profile No results for input(s): INR, PROTIME in the last 168 hours. ------------------------------------------------------------------------------------------------------------------- No results for input(s): DDIMER in the last 72 hours. -------------------------------------------------------------------------------------------------------------------  Cardiac Enzymes No results for input(s): CKMB, TROPONINI, MYOGLOBIN in the last  168 hours.  Invalid input(s): CK ------------------------------------------------------------------------------------------------------------------ No results found for: BNP   ---------------------------------------------------------------------------------------------------------------  Urinalysis    Component Value Date/Time   COLORURINE YELLOW 10/18/2022 1440   APPEARANCEUR CLOUDY (A) 10/18/2022 1440   LABSPEC 1.018 10/18/2022 1440   PHURINE 5.0 10/18/2022 1440   GLUCOSEU >=500 (A) 10/18/2022 1440   HGBUR NEGATIVE 10/18/2022 1440   BILIRUBINUR NEGATIVE 10/18/2022 1440   KETONESUR 5 (A) 10/18/2022 1440   PROTEINUR 100 (A) 10/18/2022 1440   UROBILINOGEN 0.2 06/07/2015 1815   NITRITE NEGATIVE 10/18/2022 1440   LEUKOCYTESUR NEGATIVE 10/18/2022 1440    ----------------------------------------------------------------------------------------------------------------   Imaging Results:    CT ABDOMEN PELVIS WO CONTRAST Result Date: 08/10/2024 EXAM: CT ABDOMEN AND PELVIS WITHOUT CONTRAST 08/10/2024 03:03:35 PM TECHNIQUE: CT of the abdomen and pelvis was performed without the administration of intravenous contrast. Multiplanar reformatted images are provided for review. Automated exposure control, iterative reconstruction, and/or weight-based adjustment of the mA/kV was utilized to reduce the radiation dose to as low as reasonably achievable. COMPARISON: 01/14/2023 CLINICAL HISTORY: Abdominal pain, acute, nonlocalized. FINDINGS: LOWER CHEST: No acute abnormality. LIVER: The liver is unremarkable. GALLBLADDER AND BILE DUCTS: Gallbladder is unremarkable. No biliary ductal dilatation. SPLEEN: No acute abnormality. PANCREAS: No acute abnormality. ADRENAL GLANDS: No acute abnormality. KIDNEYS, URETERS AND BLADDER: Mild bilateral renal atrophy is noted. No stones in the kidneys or ureters. No hydronephrosis. No perinephric or periureteral stranding. Urinary bladder is unremarkable. GI AND BOWEL:  Moderate gastric distention is noted. There is no bowel obstruction. PERITONEUM AND RETROPERITONEUM: No ascites. No free air. VASCULATURE: Aorta is normal in caliber. Aortic atherosclerosis. LYMPH NODES: No lymphadenopathy. REPRODUCTIVE ORGANS: No acute abnormality. BONES AND SOFT TISSUES: No acute osseous abnormality. No focal soft tissue abnormality. IMPRESSION: 1. Moderate gastric distention. 2. Mild bilateral  renal atrophy. Electronically signed by: Lynwood Seip MD 08/10/2024 03:19 PM EST RP Workstation: [id number]   DG Chest Port 1 View Result Date: 08/10/2024 CLINICAL DATA:  Weakness, hyperglycemia EXAM: PORTABLE CHEST 1 VIEW COMPARISON:  10/18/2022 FINDINGS: Single frontal view of the chest demonstrates an unremarkable cardiac silhouette. No acute airspace disease, effusion, or pneumothorax. No acute bony abnormalities. IMPRESSION: 1. No acute intrathoracic process. Electronically Signed   By: Ozell Daring M.D.   On: 08/10/2024 14:41     EKG:   Vent. rate 134 BPM PR interval 132 ms QRS duration 93 ms QT/QTcB 275/411 ms P-R-T axes 85 71 269 Sinus tachycardia Consider right atrial enlargement Left ventricular hypertrophy Abnormal T, consider ischemia, diffuse leads ST elevation, consider anterior injury    Assessment & Plan:    Active Problems:   Smoker   Vomiting   Hyperglycemia without ketosis   AKI (acute kidney injury)   Malignant hypertension   Noncompliance with medications   Type 1 diabetes mellitus with peripheral circulatory complications (HCC)   Mixed hyperlipidemia    Diabetes  mellitus, type I, poorly controlled with severe DKA - Severe DKA, with pH of 7.05, bicarb<7. - Due to noncompliance with insulin , patient reports he has not been given bolus Juliane through his Omni pump.  She has been having poor appetite. - Reports malfunctioning Dexcom, will consult diabetic coordinator for both Omni pump and Dexcom. - Admitted under DKA pathway, severe acidosis I did  give him IV insulin  bolus on top of his insulin  drip. - Continue with IV fluids, will give extra 2 L bolus. - Monitor labs closely including BMP, HBA, will repeat VBG as well PF will need bicarb drip. - Will keep n.p.o.. - Will be admitted to ICU given severe electrolyte derangement    Hyperkalemia:  - He is on Veltassa at home but will hold for now as I would anticipate potassium will drop with sodium drip and correction of hyperglycemia .  AKI in CKD 3B - By nephrologist, with anticipation for possible need of dialysis. - Continue up to 7.09 on admission, with hyperkalemia this is most likely due to volume depletion and dehydration due to his DKA - To have atrophied kidneys on imaging - Will check UA  Lactic acidosis with no evidence of infectious etiology - This most likely due to above, continue with aggressive IV fluid hydration - CT/abdomen/pelvis with no evidence of infection.  -Continue to trend   Malignant hypertension -blood pressure significantly elevated he did not receive his meds this morning, will give him his amlodipine  and labetalol  dose stat will add as needed hydralazine    HLD:  - Continue home atorvastatin .   Multiple prior CVAs - Continue with Plavix , statin  Pseudo hyponatremia -Hyperglycemia  Leukocytosis -Stress related, due to DKA    DVT Prophylaxis Heparin   AM Labs Ordered, also please review Full Orders  Family Communication: Admission, patients condition and plan of care including tests being ordered have been discussed with the patient and mother at bedside who indicate understanding and agree with the plan and Code Status.  Code Status full code  Likely DC to home  Consults called: None  Admission status: Inpatient  Time spent in minutes : 65 minutes  The patient is critically ill with multi-organ failure.  Critical care was necessary to treat or prevent imminent or life-threatening deterioration of acute renal failure severe DKA,  anion gap, and was exclusive of separately billable procedures and treating other patients. Total critical care time spent  by me: 65 minutes Time spent personally by me on obtaining history from patient or surrogate, evaluation of the patient, evaluation of patient's response to treatment, ordering and review of laboratory studies, ordering and review of radiographic studies, ordering and performing treatments and interventions, and re-evaluation of the patient's condition.   Brayton Lye M.D on 08/10/2024 at 5:16 PM   [facility] - Office  [phone]

## 2024-08-10 NOTE — ED Triage Notes (Signed)
 Pt comes in from home for hyperglycemia. On pt's glucometer it reads over 600. Pt has general weakness, nausea and vomiting.    Last dose of insulin  was 6 units of novolog  about 2 hrs ago.

## 2024-08-10 NOTE — Plan of Care (Signed)
   Problem: Education: Goal: Knowledge of General Education information will improve Description Including pain rating scale, medication(s)/side effects and non-pharmacologic comfort measures Outcome: Progressing

## 2024-08-11 ENCOUNTER — Inpatient Hospital Stay (HOSPITAL_COMMUNITY)

## 2024-08-11 DIAGNOSIS — E1010 Type 1 diabetes mellitus with ketoacidosis without coma: Secondary | ICD-10-CM | POA: Diagnosis not present

## 2024-08-11 LAB — CBC WITH DIFFERENTIAL/PLATELET
Abs Immature Granulocytes: 0.05 K/uL (ref 0.00–0.07)
Basophils Absolute: 0 K/uL (ref 0.0–0.1)
Basophils Relative: 0 %
Eosinophils Absolute: 0.2 K/uL (ref 0.0–0.5)
Eosinophils Relative: 1 %
HCT: 25.7 % — ABNORMAL LOW (ref 39.0–52.0)
Hemoglobin: 9 g/dL — ABNORMAL LOW (ref 13.0–17.0)
Immature Granulocytes: 0 %
Lymphocytes Relative: 15 %
Lymphs Abs: 1.8 K/uL (ref 0.7–4.0)
MCH: 29.9 pg (ref 26.0–34.0)
MCHC: 35 g/dL (ref 30.0–36.0)
MCV: 85.4 fL (ref 80.0–100.0)
Monocytes Absolute: 0.6 K/uL (ref 0.1–1.0)
Monocytes Relative: 5 %
Neutro Abs: 9.2 K/uL — ABNORMAL HIGH (ref 1.7–7.7)
Neutrophils Relative %: 79 %
Platelets: 228 K/uL (ref 150–400)
RBC: 3.01 MIL/uL — ABNORMAL LOW (ref 4.22–5.81)
RDW: 14.9 % (ref 11.5–15.5)
WBC: 11.8 K/uL — ABNORMAL HIGH (ref 4.0–10.5)
nRBC: 0 % (ref 0.0–0.2)

## 2024-08-11 LAB — BASIC METABOLIC PANEL WITH GFR
Anion gap: 18 — ABNORMAL HIGH (ref 5–15)
Anion gap: 16 — ABNORMAL HIGH (ref 5–15)
Anion gap: 16 — ABNORMAL HIGH (ref 5–15)
Anion gap: 13 (ref 5–15)
BUN: 69 mg/dL — ABNORMAL HIGH (ref 6–20)
BUN: 66 mg/dL — ABNORMAL HIGH (ref 6–20)
BUN: 63 mg/dL — ABNORMAL HIGH (ref 6–20)
BUN: 61 mg/dL — ABNORMAL HIGH (ref 6–20)
CO2: 14 mmol/L — ABNORMAL LOW (ref 22–32)
CO2: 16 mmol/L — ABNORMAL LOW (ref 22–32)
CO2: 16 mmol/L — ABNORMAL LOW (ref 22–32)
CO2: 18 mmol/L — ABNORMAL LOW (ref 22–32)
Calcium: 7.9 mg/dL — ABNORMAL LOW (ref 8.9–10.3)
Calcium: 8 mg/dL — ABNORMAL LOW (ref 8.9–10.3)
Calcium: 8.2 mg/dL — ABNORMAL LOW (ref 8.9–10.3)
Calcium: 8.1 mg/dL — ABNORMAL LOW (ref 8.9–10.3)
Chloride: 101 mmol/L (ref 98–111)
Chloride: 102 mmol/L (ref 98–111)
Chloride: 103 mmol/L (ref 98–111)
Chloride: 105 mmol/L (ref 98–111)
Creatinine, Ser: 6.28 mg/dL — ABNORMAL HIGH (ref 0.61–1.24)
Creatinine, Ser: 6.38 mg/dL — ABNORMAL HIGH (ref 0.61–1.24)
Creatinine, Ser: 6.4 mg/dL — ABNORMAL HIGH (ref 0.61–1.24)
Creatinine, Ser: 6.07 mg/dL — ABNORMAL HIGH (ref 0.61–1.24)
GFR, Estimated: 11 mL/min — ABNORMAL LOW (ref >60)
GFR, Estimated: 11 mL/min — ABNORMAL LOW (ref >60)
GFR, Estimated: 11 mL/min — ABNORMAL LOW (ref >60)
GFR, Estimated: 11 mL/min — ABNORMAL LOW (ref >60)
Glucose, Bld: 218 mg/dL — ABNORMAL HIGH (ref 70–99)
Glucose, Bld: 172 mg/dL — ABNORMAL HIGH (ref 70–99)
Glucose, Bld: 157 mg/dL — ABNORMAL HIGH (ref 70–99)
Glucose, Bld: 160 mg/dL — ABNORMAL HIGH (ref 70–99)
Potassium: 3.7 mmol/L (ref 3.5–5.1)
Potassium: 4 mmol/L (ref 3.5–5.1)
Potassium: 3.8 mmol/L (ref 3.5–5.1)
Potassium: 3.5 mmol/L (ref 3.5–5.1)
Sodium: 133 mmol/L — ABNORMAL LOW (ref 135–145)
Sodium: 134 mmol/L — ABNORMAL LOW (ref 135–145)
Sodium: 135 mmol/L (ref 135–145)
Sodium: 135 mmol/L (ref 135–145)

## 2024-08-11 LAB — BETA-HYDROXYBUTYRIC ACID
Beta-Hydroxybutyric Acid: 4.82 mmol/L — ABNORMAL HIGH (ref 0.05–0.27)
Beta-Hydroxybutyric Acid: 2.06 mmol/L — ABNORMAL HIGH (ref 0.05–0.27)
Beta-Hydroxybutyric Acid: 1.67 mmol/L — ABNORMAL HIGH (ref 0.05–0.27)
Beta-Hydroxybutyric Acid: 0.87 mmol/L — ABNORMAL HIGH (ref 0.05–0.27)
Beta-Hydroxybutyric Acid: 0.39 mmol/L — ABNORMAL HIGH (ref 0.05–0.27)

## 2024-08-11 LAB — GLUCOSE, CAPILLARY
Glucose-Capillary: 142 mg/dL — ABNORMAL HIGH (ref 70–99)
Glucose-Capillary: 151 mg/dL — ABNORMAL HIGH (ref 70–99)
Glucose-Capillary: 157 mg/dL — ABNORMAL HIGH (ref 70–99)
Glucose-Capillary: 158 mg/dL — ABNORMAL HIGH (ref 70–99)
Glucose-Capillary: 164 mg/dL — ABNORMAL HIGH (ref 70–99)
Glucose-Capillary: 171 mg/dL — ABNORMAL HIGH (ref 70–99)
Glucose-Capillary: 171 mg/dL — ABNORMAL HIGH (ref 70–99)
Glucose-Capillary: 171 mg/dL — ABNORMAL HIGH (ref 70–99)
Glucose-Capillary: 177 mg/dL — ABNORMAL HIGH (ref 70–99)
Glucose-Capillary: 181 mg/dL — ABNORMAL HIGH (ref 70–99)
Glucose-Capillary: 181 mg/dL — ABNORMAL HIGH (ref 70–99)
Glucose-Capillary: 188 mg/dL — ABNORMAL HIGH (ref 70–99)
Glucose-Capillary: 188 mg/dL — ABNORMAL HIGH (ref 70–99)
Glucose-Capillary: 200 mg/dL — ABNORMAL HIGH (ref 70–99)
Glucose-Capillary: 255 mg/dL — ABNORMAL HIGH (ref 70–99)

## 2024-08-11 MED ORDER — INSULIN ASPART 100 UNIT/ML IJ SOLN
2.0000 [IU] | Freq: Three times a day (TID) | INTRAMUSCULAR | Status: DC
  Administered 2024-08-11: 2 [IU] via SUBCUTANEOUS
  Filled 2024-08-11: qty 1

## 2024-08-11 MED ORDER — INSULIN DEGLUDEC 100 UNIT/ML ~~LOC~~ SOPN: 14.0000 [IU] | PEN_INJECTOR | Freq: Every day | SUBCUTANEOUS | 3 refills | Status: DC

## 2024-08-11 MED ORDER — POTASSIUM CHLORIDE 10 MEQ/100ML IV SOLN
10.0000 meq | INTRAVENOUS | Status: AC
  Administered 2024-08-11 (×3): 10 meq via INTRAVENOUS
  Filled 2024-08-11 (×3): qty 100

## 2024-08-11 MED ORDER — PEN NEEDLES 3/16" 31G X 5 MM MISC: 11 refills | Status: AC

## 2024-08-11 MED ORDER — HEPARIN SODIUM (PORCINE) 5000 UNIT/ML IJ SOLN: 5000.0000 [IU] | Freq: Three times a day (TID) | INTRAMUSCULAR | Status: DC

## 2024-08-11 MED ORDER — INSULIN GLARGINE-YFGN 100 UNIT/ML ~~LOC~~ SOLN
12.0000 [IU] | Freq: Once | SUBCUTANEOUS | Status: AC
  Administered 2024-08-11: 12 [IU] via SUBCUTANEOUS
  Filled 2024-08-11: qty 0.12

## 2024-08-11 MED ORDER — INSULIN ASPART 100 UNIT/ML IJ SOLN
0.0000 [IU] | INTRAMUSCULAR | Status: DC
  Administered 2024-08-11: 1 [IU] via SUBCUTANEOUS
  Filled 2024-08-11: qty 1

## 2024-08-11 NOTE — Progress Notes (Signed)
 51 RN walks into pt room to fix beeping IV pump. PT is actively getting out of bed to pee he said, while actively peeing, then slipped and fell in urine. RN called in charge RN and other staff to help get patient situated and assessed. PT stated he hit his right eye, but denied pain or vision changes. At 0059 RN reached out to Erminio Coleridge, NP who then placed orders for a head CT.

## 2024-08-11 NOTE — Progress Notes (Signed)
 Patient legal guardian (mother) at bedside. Patient is being discharged home. IVs removed and sites intact. Discharge instruction's reviewed with the patient and family and all questions answered. The patient is satisfied that all belongings have been returned.

## 2024-08-11 NOTE — Significant Event (Addendum)
"  ° °      CROSS COVER NOTE  NAME: Nathaniel [name] MRN: [id number] DOB : 1985/11/28 ATTENDING PHYSICIAN: Elgergawy, Brayton RAMAN, MD    Date of Service   08/11/2024   HPI/Events of Note   [facility] Cross Cover for Zelda Salmon Nurse reports patient fell in room, unwitnessed and patient reports hitting his eye, no apparent injury and stable vitals  HPI reviewed extensive history. Currently being treated with IV fluids and IV insulin  for DKA  Interventions   Assessment/Plan:    08/11/2024    2:02 AM 08/11/2024    1:04 AM 08/11/2024   12:57 AM  Vitals with BMI  Systolic 138 147 840  Diastolic 76 75 75  Pulse 94 97     CT head wo contrast ordered stat and resulted as follows Left mastoid and middle ear effusion which appears to be chronic condition as finding is present way back in 2015 per chart review. Patient at baseline neuro status and hemodynamically stable Denies any left sided head pain, afebrile No apparent injury  Fall precautions      Erminio LITTIE Cone NP [facility] Cross Cover 7pm-7am - check amion for availability Pager [phone]    "

## 2024-08-11 NOTE — Discharge Summary (Signed)
 Physician Discharge Summary  Nathaniel [name] FMW:[id number] DOB: June 01, 1986 DOA: 08/10/2024  PCP: Carlette Nathaniel Area, MD  Admit date: 08/10/2024  Discharge date: 08/11/2024  Admitted From:Home  Disposition:  Home  Recommendations for Outpatient Follow-up:  Follow up with nephrology as scheduled on 11/24 with Dr. Rayburn and follow-up BMP Follow-up with PCP as scheduled Remain on Tresiba  14 units at bedtime as prescribed NovoLog  2-5 units at mealtime as prior May resume insulin  pump at lunchtime on 11/23 Continue other home medications as prior  Home Health: None  Equipment/Devices: None  Discharge Condition:Stable  CODE STATUS: Full  Diet recommendation: Heart Healthy/carb modified  Brief/Interim Summary: Nathaniel [name]  is a 38 y.o. male, with past medical history of type 1 diabetes mellitus, on insulin  pump at home, history of cardiac arrest in the past due to DKA/electrolyte derangement, necrotizing fasciitis last year, history of paresis,C Kd stage IIIb, hyperlipidemia, hypertension, CVA. - Patient comes to ED due to nausea, vomiting and hyperglycemia, patient reports his Dexcom has been malfunctioning for last few days where he could not get an accurate readings, he is not sure about his Omni pump, but reports it has been working okay but he has not been giving himself bolus insulin  and he has not been feeling like eating.  He reports the most recent readings were over 600s he denies any chest pain, shortness of breath.  Patient was admitted for severe DKA in the setting of poorly controlled type 1 diabetes and was started on IV fluid as well as insulin  drip.  He was also noted to have hyperkalemia which corrected with the use of IV insulin  as well as fluids.  He was also noted to have associated AKI on CKD stage IV which is now improving and he seems to have baseline creatinine near 5.  He has no significant volume overload or hyperkalemia and acidosis is much  improved.  He is tolerating diet at this point as well and has close follow-up with his nephrologist on Monday.  Patient and family feel comfortable with this plan and are well aware of how to use his home insulin  pump and monitor his blood glucose levels.  They have had conversation with diabetes coordinator as well.  No other acute events or concerns noted throughout the course of this admission and he is stable for discharge.  Discharge Diagnoses:  Active Problems:   Smoker   Vomiting   Hyperglycemia without ketosis   AKI (acute kidney injury)   Malignant hypertension   Noncompliance with medications   DKA (diabetic ketoacidosis) (HCC)   Type 1 diabetes mellitus with peripheral circulatory complications (HCC)   Mixed hyperlipidemia  Principal discharge diagnosis: Severe DKA in the setting of poorly controlled type 1 diabetes along with hyperkalemia in the setting of AKI with CKD stage IV.  Discharge Instructions  Discharge Instructions     Diet - low sodium heart healthy   Complete by: As directed    Increase activity slowly   Complete by: As directed       Allergies as of 08/11/2024       Reactions   Fructose Other (See Comments)   Increase of blood sugar Orange juice        Medication List     TAKE these medications    amLODipine  10 MG tablet Commonly known as: NORVASC  Take 1 tablet (10 mg total) by mouth daily. For blood pressure   Aspirin  Low Dose 81 MG tablet Generic drug: aspirin  EC Take 81 mg  by mouth every morning.   atorvastatin  80 MG tablet Commonly known as: LIPITOR  Take 1 tablet (80 mg total) by mouth every evening. For stroke prevention   clopidogrel  75 MG tablet Commonly known as: PLAVIX  Take 1 tablet (75 mg total) by mouth daily. For stroke prevention   Dexcom G6 Transmitter Misc Check sugar 4 times a day   Dexcom G7 Sensor Misc Inject 1 Application into the skin as directed. Change sensor every 10 days as directed.   insulin  aspart  100 UNIT/ML injection Commonly known as: NovoLOG  Use with Omnipod for TDD around 50 units daily What changed:  how much to take how to take this when to take this   NovoLOG  FlexPen 100 UNIT/ML FlexPen Generic drug: insulin  aspart Inject 2-5 Units into the skin as needed (when Omnipod is not working). What changed: Another medication with the same name was changed. Make sure you understand how and when to take each.   insulin  degludec 100 UNIT/ML FlexTouch Pen Commonly known as: TRESIBA  Inject 14 Units into the skin at bedtime.   labetalol  100 MG tablet Commonly known as: NORMODYNE  Take 1 tablet (100 mg total) by mouth 2 (two) times daily. For BP   Omnipod 5 DexG7G6 Intro Gen 5 Kit Change pod every 72 hours   Omnipod 5 DexG7G6 Pods Gen 5 Misc CHANGE POD EVERY 72 HOURS   OneTouch Ultra test strip Generic drug: glucose blood USE TO TEST BLOOD SUGAR 4 TIMES A DAY   Pen Needles 3/16 31G X 5 MM Misc Four times day   Veltassa 8.4 g packet Generic drug: patiromer Take 1 packet by mouth 3 (three) times a week.   VITAMIN D  PO Take 25,000 Units by mouth once a week.        Follow-up Information     Nathaniel, Nathaniel Area, MD. Schedule an appointment as soon as possible for a visit in 1 week(s).   Specialty: Internal Medicine Contact information: [address] [place] [address] [phone]         Rayburn Pac, MD. Go on 08/13/2024.   Specialty: Nephrology Contact information: [address] [place] [address] [phone]                Allergies  Allergen Reactions   Fructose Other (See Comments)    Increase of blood sugar Orange juice     Consultations: None   Procedures/Studies: CT HEAD WO CONTRAST ( ) Result Date: 08/11/2024 EXAM: CT HEAD WITHOUT CONTRAST 08/11/2024 01:55:04 AM TECHNIQUE: CT of the head was performed without the administration of intravenous contrast. Automated exposure control, iterative  reconstruction, and/or weight based adjustment of the mA/kV was utilized to reduce the radiation dose to as low as reasonably achievable. COMPARISON: None available. CLINICAL HISTORY: Orbital trauma FINDINGS: BRAIN AND VENTRICLES: No acute hemorrhage. No evidence of acute infarct. No hydrocephalus. No extra-axial collection. No mass effect or midline shift. ORBITS: No acute abnormality. SINUSES: No acute abnormality. SOFT TISSUES AND SKULL: No skull fracture. OTHER: Left mastoid and middle ear effusion. IMPRESSION: 1. No acute intracranial abnormality. 2. Left mastoid and middle ear effusion. Electronically signed by: Gilmore Molt MD 08/11/2024 02:25 AM EST RP Workstation: [id number]   CT ABDOMEN PELVIS WO CONTRAST Result Date: 08/10/2024 EXAM: CT ABDOMEN AND PELVIS WITHOUT CONTRAST 08/10/2024 03:03:35 PM TECHNIQUE: CT of the abdomen and pelvis was performed without the administration of intravenous contrast. Multiplanar reformatted images are provided for review. Automated exposure control, iterative reconstruction, and/or weight-based adjustment of the mA/kV was  utilized to reduce the radiation dose to as low as reasonably achievable. COMPARISON: 01/14/2023 CLINICAL HISTORY: Abdominal pain, acute, nonlocalized. FINDINGS: LOWER CHEST: No acute abnormality. LIVER: The liver is unremarkable. GALLBLADDER AND BILE DUCTS: Gallbladder is unremarkable. No biliary ductal dilatation. SPLEEN: No acute abnormality. PANCREAS: No acute abnormality. ADRENAL GLANDS: No acute abnormality. KIDNEYS, URETERS AND BLADDER: Mild bilateral renal atrophy is noted. No stones in the kidneys or ureters. No hydronephrosis. No perinephric or periureteral stranding. Urinary bladder is unremarkable. GI AND BOWEL: Moderate gastric distention is noted. There is no bowel obstruction. PERITONEUM AND RETROPERITONEUM: No ascites. No free air. VASCULATURE: Aorta is normal in caliber. Aortic atherosclerosis. LYMPH NODES: No lymphadenopathy.  REPRODUCTIVE ORGANS: No acute abnormality. BONES AND SOFT TISSUES: No acute osseous abnormality. No focal soft tissue abnormality. IMPRESSION: 1. Moderate gastric distention. 2. Mild bilateral renal atrophy. Electronically signed by: Lynwood Seip MD 08/10/2024 03:19 PM EST RP Workstation: [id number]   DG Chest Port 1 View Result Date: 08/10/2024 CLINICAL DATA:  Weakness, hyperglycemia EXAM: PORTABLE CHEST 1 VIEW COMPARISON:  10/18/2022 FINDINGS: Single frontal view of the chest demonstrates an unremarkable cardiac silhouette. No acute airspace disease, effusion, or pneumothorax. No acute bony abnormalities. IMPRESSION: 1. No acute intrathoracic process. Electronically Signed   By: Ozell Daring M.D.   On: 08/10/2024 14:41     Discharge Exam: Vitals:   08/11/24 1600 08/11/24 1607  BP: (!) 153/90   Pulse:    Resp: 16   Temp:  98.1 F (36.7 C)  SpO2:     Vitals:   08/11/24 1400 08/11/24 1500 08/11/24 1600 08/11/24 1607  BP: 128/68 (!) 122/58 (!) 153/90   Pulse:      Resp: 12 13 16    Temp:    98.1 F (36.7 C)  TempSrc:    Oral  SpO2:      Weight:      Height:        General: Pt is alert, awake, not in acute distress Cardiovascular: RRR, S1/S2 +, no rubs, no gallops Respiratory: CTA bilaterally, no wheezing, no rhonchi Abdominal: Soft, NT, ND, bowel sounds + Extremities: no edema, no cyanosis    The results of significant diagnostics from this hospitalization (including imaging, microbiology, ancillary and laboratory) are listed below for reference.     Microbiology: Recent Results (from the past 240 hours)  Culture, blood (routine x 2)     Status: None (Preliminary result)   Collection Time: 08/10/24  2:37 PM   Specimen: BLOOD RIGHT WRIST  Result Value Ref Range Status   Specimen Description   Final    BLOOD RIGHT WRIST BOTTLES DRAWN AEROBIC AND ANAEROBIC   Special Requests Blood Culture adequate volume  Final   Culture   Final    NO GROWTH < 24 HOURS Performed at  [facility], [address], [place], [place] [address]    Report Status PENDING  Incomplete  Culture, blood (routine x 2)     Status: None (Preliminary result)   Collection Time: 08/10/24  2:37 PM   Specimen: Right Antecubital; Blood  Result Value Ref Range Status   Specimen Description   Final    RIGHT ANTECUBITAL BOTTLES DRAWN AEROBIC AND ANAEROBIC   Special Requests Blood Culture adequate volume  Final   Culture   Final    NO GROWTH < 24 HOURS Performed at [facility], [address] [place], [place] [address]    Report Status PENDING  Incomplete  MRSA Next Gen by PCR, Nasal  Status: None   Collection Time: 08/10/24  7:49 PM   Specimen: Nasal Mucosa; Nasal Swab  Result Value Ref Range Status   MRSA by PCR Next Gen NOT DETECTED NOT DETECTED Final    Comment: (NOTE) The GeneXpert MRSA Assay (FDA approved for NASAL specimens only), is one component of a comprehensive MRSA colonization surveillance program. It is not intended to diagnose MRSA infection nor to guide or monitor treatment for MRSA infections. Test performance is not FDA approved in patients less than 44 years old. Performed at [facility], [address], [place], [place] [address]      Labs: BNP (last 3 results) No results for input(s): BNP in the last 8760 hours. Basic Metabolic Panel: Recent Labs  Lab 08/10/24 1718 08/10/24 2037 08/11/24 0123 08/11/24 0504 08/11/24 0848 08/11/24 1312  NA 124* 130* 133* 134* 135 135  K 4.8 3.6 3.7 4.0 3.8 3.5  CL 83* 94* 101 102 103 105  CO2 <7* 12* 14* 16* 16* 18*  GLUCOSE 794* 422* 218* 172* 157* 160*  BUN 77* 74* 69* 66* 63* 61*  CREATININE 6.51* 6.48* 6.28* 6.38* 6.40* 6.07*  CALCIUM  7.8* 7.9* 7.9* 8.0* 8.2* 8.1*  MG 1.9  --   --   --   --   --   PHOS 5.7*  --   --   --   --   --    Liver Function Tests: Recent Labs  Lab 08/10/24 1437  AST 20  ALT 19  ALKPHOS 156*  BILITOT 0.4  PROT 8.1  ALBUMIN 4.5   Recent Labs  Lab 08/10/24 1437  LIPASE  70*   No results for input(s): AMMONIA in the last 168 hours. CBC: Recent Labs  Lab 08/10/24 1437 08/10/24 1440 08/11/24 0504  WBC 14.4*  --  11.8*  NEUTROABS 13.0*  --  9.2*  HGB 12.2* 13.9 9.0*  HCT 38.5* 41.0 25.7*  MCV 92.1  --  85.4  PLT 299  --  228   Cardiac Enzymes: No results for input(s): CKTOTAL, CKMB, CKMBINDEX, TROPONINI in the last 168 hours. BNP: Invalid input(s): POCBNP CBG: Recent Labs  Lab 08/11/24 1127 08/11/24 1240 08/11/24 1407 08/11/24 1524 08/11/24 1627  GLUCAP 157* 158* 142* 200* 188*   D-Dimer No results for input(s): DDIMER in the last 72 hours. Hgb A1c No results for input(s): HGBA1C in the last 72 hours. Lipid Profile No results for input(s): CHOL, HDL, LDLCALC, TRIG, CHOLHDL, LDLDIRECT in the last 72 hours. Thyroid  function studies No results for input(s): TSH, T4TOTAL, T3FREE, THYROIDAB in the last 72 hours.  Invalid input(s): FREET3 Anemia work up No results for input(s): VITAMINB12, FOLATE, FERRITIN, TIBC, IRON, RETICCTPCT in the last 72 hours. Urinalysis    Component Value Date/Time   COLORURINE COLORLESS (A) 08/10/2024 1408   APPEARANCEUR CLEAR 08/10/2024 1408   LABSPEC 1.007 08/10/2024 1408   PHURINE 5.0 08/10/2024 1408   GLUCOSEU >=500 (A) 08/10/2024 1408   HGBUR SMALL (A) 08/10/2024 1408   BILIRUBINUR NEGATIVE 08/10/2024 1408   KETONESUR 20 (A) 08/10/2024 1408   PROTEINUR 100 (A) 08/10/2024 1408   UROBILINOGEN 0.2 06/07/2015 1815   NITRITE NEGATIVE 08/10/2024 1408   LEUKOCYTESUR NEGATIVE 08/10/2024 1408   Sepsis Labs Recent Labs  Lab 08/10/24 1437 08/11/24 0504  WBC 14.4* 11.8*   Microbiology Recent Results (from the past 240 hours)  Culture, blood (routine x 2)     Status: None (Preliminary result)   Collection Time: 08/10/24  2:37 PM   Specimen:  BLOOD RIGHT WRIST  Result Value Ref Range Status   Specimen Description   Final    BLOOD RIGHT WRIST BOTTLES DRAWN  AEROBIC AND ANAEROBIC   Special Requests Blood Culture adequate volume  Final   Culture   Final    NO GROWTH < 24 HOURS Performed at [facility], [address], [place], [place] [address]    Report Status PENDING  Incomplete  Culture, blood (routine x 2)     Status: None (Preliminary result)   Collection Time: 08/10/24  2:37 PM   Specimen: Right Antecubital; Blood  Result Value Ref Range Status   Specimen Description   Final    RIGHT ANTECUBITAL BOTTLES DRAWN AEROBIC AND ANAEROBIC   Special Requests Blood Culture adequate volume  Final   Culture   Final    NO GROWTH < 24 HOURS Performed at [facility], [address] [place], [place] [address]    Report Status PENDING  Incomplete  MRSA Next Gen by PCR, Nasal     Status: None   Collection Time: 08/10/24  7:49 PM   Specimen: Nasal Mucosa; Nasal Swab  Result Value Ref Range Status   MRSA by PCR Next Gen NOT DETECTED NOT DETECTED Final    Comment: (NOTE) The GeneXpert MRSA Assay (FDA approved for NASAL specimens only), is one component of a comprehensive MRSA colonization surveillance program. It is not intended to diagnose MRSA infection nor to guide or monitor treatment for MRSA infections. Test performance is not FDA approved in patients less than 71 years old. Performed at [facility], [address], [place], [place] [address]      Time coordinating discharge: 35 minutes  SIGNED:   Adron JONETTA Fairly, DO [facility] 08/11/2024, 4:44 PM  If 7PM-7AM, please contact night-coverage [email or web address]

## 2024-08-11 NOTE — TOC Initial Note (Signed)
 Transition of Care [facility]) - Initial/Assessment Note    Patient Details  Name: Nathaniel [name] MRN: [id number] Date of Birth: Aug 23, 1986  [facility] CM/SW Contact:    Lucie Lunger, LCSWA Phone Number: 08/11/2024, 12:45 PM  Clinical Narrative:                 Pt is high risk for readmission. CSW spoke with pts mother who confirms she is pts guardian. Pt lives with his mother and she provides transportation when needed. Pt is independent in completing his ADLs. Pt used to use a cane but no longer needs DME when ambulating. TOC to follow.   Expected Discharge Plan: Home/Self Care Barriers to Discharge: Continued Medical Work up   Patient Goals and CMS Choice Patient states their goals for this hospitalization and ongoing recovery are:: return home CMS Medicare.gov Compare Post Acute Care list provided to:: Legal Guardian Choice offered to / list presented to : [facility] POA / Guardian      Expected Discharge Plan and Services In-house Referral: Clinical Social Work Discharge Planning Services: CM Consult   Living arrangements for the past 2 months: Single Family Home                                      Prior Living Arrangements/Services Living arrangements for the past 2 months: Single Family Home Lives with:: Parents Patient language and need for interpreter reviewed:: Yes Do you feel safe going back to the place where you live?: Yes      Need for Family Participation in Patient Care: Yes (Comment) Care giver support system in place?: Yes (comment)   Criminal Activity/Legal Involvement Pertinent to Current Situation/Hospitalization: No - Comment as needed  Activities of Daily Living   ADL Screening (condition at time of admission) Independently performs ADLs?: Yes (appropriate for developmental age) Is the patient deaf or have difficulty hearing?: No Does the patient have difficulty seeing, even when wearing glasses/contacts?: No Does the patient  have difficulty concentrating, remembering, or making decisions?: No  Permission Sought/Granted                  Emotional Assessment Appearance:: Appears stated age       Alcohol / Substance Use: Not Applicable Psych Involvement: No (comment)  Admission diagnosis:  Hyperkalemia [E87.5] DKA (diabetic ketoacidosis) (HCC) [E11.10] Diabetic ketoacidosis without coma associated with type 1 diabetes mellitus (HCC) [E10.10] Acute renal failure superimposed on chronic kidney disease, unspecified acute renal failure type, unspecified CKD stage [N17.9, N18.9] Patient Active Problem List   Diagnosis Date Noted   Cardiac arrest, cause unspecified (HCC) 10/18/2022   Septic shock (HCC) 10/18/2022   Acute respiratory failure (HCC) 10/18/2022   Primary open angle glaucoma of left eye, mild stage 12/21/2021   SIRS (systemic inflammatory response syndrome) (HCC) 07/15/2020   Acute metabolic encephalopathy due to DKA/AKI/Dehydration 07/15/2020   DKA (diabetic ketoacidosis) (HCC) 07/14/2020   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 determined by examination associated with type 1 diabetes mellitus (HCC) 01/28/2020   Stable treated proliferative diabetic retinopathy of left eye without macular edema determined by examination associated with type 1 diabetes mellitus (HCC) 01/28/2020   Pseudophakia 01/28/2020   CKD stage 3 secondary to diabetes (HCC)    Hypoglycemia    Neurosyphilis    Mixed hyperlipidemia    Acute ischemic left MCA stroke (HCC) 02/22/2018   Abnormality of  gait following cerebrovascular accident (CVA)    Type 1 diabetes mellitus with peripheral circulatory complications (HCC)    Essential hypertension    Dyslipidemia    Syphilis    DKA (diabetic ketoacidosis) (HCC) 02/16/2018   CVA (cerebral vascular accident) (HCC) 02/16/2018   Acute renal failure (ARF) 10/21/2016   Diabetic ketoacidosis without coma associated with type 1 diabetes mellitus (HCC)     Hyperbilirubinemia    Hyponatremia 04/15/2016   DM (diabetes mellitus) type 1, uncontrolled, with ketoacidosis (HCC) 04/15/2016   Hyperglycemia 04/15/2016   AKI (acute kidney injury) 01/04/2016   Malignant hypertension 01/04/2016   Noncompliance with medications 01/04/2016   Intractable nausea and vomiting 04/01/2015   Gastroparesis 04/01/2015   Type 1 diabetes mellitus with complication (HCC) 04/01/2015   Chronic hypertension    Nausea with vomiting    Abscess, gluteal, right 06/21/2014   Nausea and vomiting 04/08/2013   Hyperglycemia without ketosis 04/08/2013   Essential hypertension, benign 04/08/2013   Hypercalcemia 07/29/2011   Vomiting 07/28/2011   Leukocytosis 07/28/2011   Hypokalemia 07/28/2011   Dehydration 07/27/2011   Smoker 07/27/2011   Marijuana abuse 07/27/2011   PCP:  Carlette [name], MD Pharmacy:   [facility] [phone] - [place], [place] - [address] AT [facility] [address] [place] [address] Phone: [phone] Fax: [phone]  [facility] [place] (New Address) - [place], [place] - [address] [facility] AT Previously: [address]iana [address], [place] [address] [facility] [address] [place] [address] Phone: [phone] Fax: [phone]     Social Drivers of Health (SDOH) Social History: SDOH Screenings   Food Insecurity: No Food Insecurity (08/10/2024)  Housing: Low Risk  (08/10/2024)  Transportation Needs: No Transportation Needs (08/10/2024)  Utilities: Not At Risk (08/10/2024)  Depression (PHQ2-9): Low Risk  (01/26/2021)  Financial Resource Strain: Low Risk  (01/17/2023)  Tobacco Use: High Risk (05/23/2024)   SDOH Interventions:     Readmission Risk Interventions    08/11/2024   12:44 PM 10/20/2022    3:34 PM  Readmission Risk Prevention Plan  Transportation Screening Complete Complete  HRI or Home Care Consult Complete Complete  Social Work Consult for Recovery Care  Planning/Counseling Complete Complete  Palliative Care Screening Not Applicable Not Applicable  Medication Review Oceanographer) Complete Referral to Pharmacy

## 2024-08-11 NOTE — Care Management Obs Status (Signed)
 MEDICARE OBSERVATION STATUS NOTIFICATION   Patient Details  Name: Nathaniel [name] MRN: [id number] Date of Birth: [birth year]   Medicare Observation Status Notification Given:  Yes    Inocente GORMAN Kindle, LCSW 08/11/2024, 5:18 PM

## 2024-08-11 NOTE — Care Management CC44 (Signed)
 Condition Code 44 Documentation Completed  Patient Details  Name: Nathaniel [name] MRN: [id number] Date of Birth: 1986/06/03   Condition Code 44 given:  Yes Patient signature on Condition Code 44 notice:  Yes Documentation of 2 MD's agreement:  Yes Code 44 added to claim:  Yes    Inocente GORMAN Kindle, LCSW 08/11/2024, 5:18 PM

## 2024-08-11 NOTE — Progress Notes (Signed)
   08/11/24 0104  What Happened  Was fall witnessed? Yes  Who witnessed fall? Nathaniel [name]  Patients activity before fall bathroom-unassisted;to/from bed, chair, or stretcher  Point of contact  (Hands and knees)  Was patient injured? No  Provider Notification  Provider Name/Title Erminio Cone, NP  Date Provider Notified 08/11/24  Time Provider Notified [phone]  Method of Notification Page (secure chat)  Notification Reason Fall  Provider response See new orders  Date of Provider Response 08/11/24  Time of Provider Response 0102  Follow Up  Family notified No - patient refusal  Additional tests Yes-comment (CT of head)  Progress note created (see row info) Yes  Adult Fall Risk Assessment  Risk Factor Category (scoring not indicated) High fall risk per protocol (document High fall risk);Fall has occurred during this admission (document High fall risk)  Patient Fall Risk Level High fall risk  Adult Fall Risk Interventions  Required Bundle Interventions *See Row Information* High fall risk  Additional Interventions Use of appropriate toileting equipment (bedpan, BSC, etc.) (Simultaneous filing. User may not have seen previous data.)  Fall intervention(s) refused/Patient educated regarding refusal Bed alarm;Nonskid socks;Open door if unsupervised  Screening for Fall Injury Risk (To be completed on HIGH fall risk patients) - Assessing Need for Floor Mats  Risk For Fall Injury- Criteria for Floor Mats Noncompliant with safety precautions  Vitals  Temp 98.2 F (36.8 C)  Temp Source Oral  BP (!) 147/75  MAP (mmHg) 99  BP Location Left Arm  BP Method Automatic  Patient Position (if appropriate) Lying  Pulse Rate 97  Pulse Rate Source Monitor  ECG Heart Rate 100  Cardiac Rhythm NSR  Resp 15  Oxygen Therapy  SpO2 100 %  O2 Device Room Air  Pain Assessment  Pain Scale 0-10  Pain Score 0  PCA/Epidural/Spinal Assessment  Respiratory Pattern Regular;Unlabored;Symmetrical   Neurological  Neuro (WDL) WDL  Level of Consciousness Alert  Orientation Level Oriented X4  Cognition Impulsive;Poor judgement  Speech Clear  R Pupil Size (mm) 3  R Pupil Shape Round  R Pupil Reaction Brisk  L Pupil Size (mm) 3  L Pupil Shape Round  L Pupil Reaction Brisk  Glasgow Coma Scale  Eye Opening 4  Best Verbal Response (NON-intubated) 5  Best Motor Response 6  Glasgow Coma Scale Score 15  Musculoskeletal  Musculoskeletal (WDL) X  Integumentary  Integumentary (WDL) X  Nurse assisting at admit/transfer - Full skin assessment Nathaniel Linger RN  Skin Color Appropriate for ethnicity  Skin Condition Dry  Skin Integrity Abrasion;Other (Comment) (old scar right hip)

## 2024-08-11 NOTE — Inpatient Diabetes Management (Signed)
 Inpatient Diabetes Program Recommendations  AACE/ADA: New Consensus Statement on Inpatient Glycemic Control (2015)  Target Ranges:  Prepandial:   less than 140 mg/dL      Peak postprandial:   less than 180 mg/dL (1-2 hours)      Critically ill patients:  140 - 180 mg/dL   Lab Results  Component Value Date   GLUCAP 171 (H) 08/11/2024   HGBA1C 8.3 (A) 05/23/2024    Review of Glycemic Control  Latest Reference Range & Units 08/11/24 09:26 08/11/24 10:28  Glucose-Capillary 70 - 99 mg/dL 828 (H) 828 (H)   Diabetes history: DM 1 Outpatient Diabetes medications:  Omnipod insulin  pump- Prior to starting insulin  pump- Tresiba  14 units q HS Novolog  2-5 units tid with meals  Current orders for Inpatient glycemic control:  IV insulin /DKA orders  Inpatient Diabetes Program Recommendations:    Once patient is ready to transition off insulin  drip (acidosis cleared), consider resuming basal/bolus insulin  as patient was on prior to starting insulin  pump.   -Consider Semglee  12 units 2 hours prior to d/c of insulin  pump. Novolog  0-6 units tid with meals and HS plus Novolog  2-3 units tid with meals for CHO coverage (hold if patient eats less than 50% or NPO).   I spoke to mother by phone.  She states that they have struggled with CGM connecting to Omnipod since switching to Dexcom G7. She states that she feels they may need more training on insulin  pump.  States she has the insulin  pump trainer's number.  I asked if they are willing to restart basal/bolus shots until they can follow- up with trainer and she agrees.  Reminded her that she will need to wait to restart pump until atleast 24 hours after basal insulin  given.   Randall Bullocks, RN, BC-ADM Inpatient Diabetes Coordinator Pager [phone]

## 2024-08-15 LAB — CULTURE, BLOOD (ROUTINE X 2)
Culture: NO GROWTH
Culture: NO GROWTH
Special Requests: ADEQUATE
Special Requests: ADEQUATE

## 2024-08-23 ENCOUNTER — Ambulatory Visit: Admitting: Nurse Practitioner

## 2024-08-23 ENCOUNTER — Telehealth: Payer: Self-pay | Admitting: *Deleted

## 2024-08-23 DIAGNOSIS — I10 Essential (primary) hypertension: Secondary | ICD-10-CM

## 2024-08-23 DIAGNOSIS — Z794 Long term (current) use of insulin: Secondary | ICD-10-CM

## 2024-08-23 DIAGNOSIS — E7800 Pure hypercholesterolemia, unspecified: Secondary | ICD-10-CM

## 2024-08-23 DIAGNOSIS — N184 Chronic kidney disease, stage 4 (severe): Secondary | ICD-10-CM

## 2024-08-23 NOTE — Telephone Encounter (Signed)
 Patient had appointment. He had no readings nor did he have his Dexcom inserted in his arm. Patient was recently in the hospital.  Patient's appointment was rescheduled and a sensor was applied to his left upper arm. Patient and his mother were advised to call Mliss with Omnipod to get this reconnected. His mother states that she will do this In the conversation, his mothers shares that the patient has not been taking his medications. I ask him why, he shared that he was just tired of it. I encouraged him to please start taking his medications. He states that he will.

## 2024-09-04 ENCOUNTER — Other Ambulatory Visit (HOSPITAL_COMMUNITY): Payer: Self-pay | Admitting: Nephrology

## 2024-09-04 DIAGNOSIS — D631 Anemia in chronic kidney disease: Secondary | ICD-10-CM | POA: Insufficient documentation

## 2024-09-05 ENCOUNTER — Telehealth: Payer: Self-pay

## 2024-09-05 NOTE — Telephone Encounter (Signed)
 Auth Submission: NO AUTH NEEDED Site of care: Site of care: [facility] Payer: [employer] medicare Medication & CPT/J Code(s) submitted: Venofer (Iron Sucrose) J1756 Diagnosis Code:  Route of submission (phone, fax, portal): portal Phone # Fax # Auth type: Buy/Bill PB Units/visits requested: 200mg  x 5doses Reference number:  Approval from: 09/05/24 to 09/19/24

## 2024-09-07 ENCOUNTER — Encounter: Admitting: Emergency Medicine

## 2024-09-07 ENCOUNTER — Ambulatory Visit

## 2024-09-07 VITALS — BP 182/108 | HR 80 | Temp 98.1°F | Resp 16

## 2024-09-07 DIAGNOSIS — N1832 Chronic kidney disease, stage 3b: Secondary | ICD-10-CM

## 2024-09-07 DIAGNOSIS — D631 Anemia in chronic kidney disease: Secondary | ICD-10-CM | POA: Diagnosis not present

## 2024-09-07 MED ORDER — ACETAMINOPHEN 325 MG PO TABS
650.0000 mg | ORAL_TABLET | Freq: Once | ORAL | Status: AC
  Administered 2024-09-07: 650 mg via ORAL

## 2024-09-07 MED ORDER — IRON SUCROSE 20 MG/ML IV SOLN
200.0000 mg | Freq: Once | INTRAVENOUS | Status: AC
  Administered 2024-09-07: 200 mg via INTRAVENOUS

## 2024-09-07 MED ORDER — DIPHENHYDRAMINE HCL 25 MG PO CAPS
25.0000 mg | ORAL_CAPSULE | Freq: Once | ORAL | Status: AC
  Administered 2024-09-07: 25 mg via ORAL

## 2024-09-07 NOTE — Progress Notes (Signed)
 Diagnosis: Iron  Deficiency Anemia  Provider:  Dennise Hoes, MD  Procedure: IV Push  IV Type: Peripheral, IV Location: L Wrist  Venofer  (Iron  Sucrose), Dose: 200 mg  Post Infusion IV Care: Observation period completed and Peripheral IV Discontinued  Discharge: Condition: Good, Destination: Home . AVS Declined  Performed by:  Delon ONEIDA Officer, RN

## 2024-09-10 ENCOUNTER — Ambulatory Visit: Admitting: Nurse Practitioner

## 2024-09-10 ENCOUNTER — Encounter: Payer: Self-pay | Admitting: Nurse Practitioner

## 2024-09-10 VITALS — BP 124/82 | HR 68 | Ht 71.0 in | Wt 146.0 lb

## 2024-09-10 DIAGNOSIS — N184 Chronic kidney disease, stage 4 (severe): Secondary | ICD-10-CM | POA: Diagnosis not present

## 2024-09-10 DIAGNOSIS — E1022 Type 1 diabetes mellitus with diabetic chronic kidney disease: Secondary | ICD-10-CM | POA: Diagnosis not present

## 2024-09-10 DIAGNOSIS — E7800 Pure hypercholesterolemia, unspecified: Secondary | ICD-10-CM

## 2024-09-10 DIAGNOSIS — I10 Essential (primary) hypertension: Secondary | ICD-10-CM | POA: Diagnosis not present

## 2024-09-10 DIAGNOSIS — Z794 Long term (current) use of insulin: Secondary | ICD-10-CM | POA: Diagnosis not present

## 2024-09-10 LAB — POCT GLYCOSYLATED HEMOGLOBIN (HGB A1C): Hemoglobin A1C: 8.3 % — AB (ref 4.0–5.6)

## 2024-09-10 MED ORDER — OMNIPOD 5 DEXG7G6 PODS GEN 5 MISC: 6 refills | Status: AC

## 2024-09-10 MED ORDER — DEXCOM G7 SENSOR MISC: 1.0000 | 3 refills | Status: AC

## 2024-09-10 MED ORDER — INSULIN ASPART 100 UNIT/ML IJ SOLN: INTRAMUSCULAR | 3 refills | Status: AC

## 2024-09-10 NOTE — Progress Notes (Signed)
 "                                                                        Endocrinology Follow Up Note       09/10/2024, 3:34 PM   Subjective:    Patient ID: Nathaniel [name], male    DOB: 30-May-1986.  Nathaniel [name] is being seen in follow up after being seen in consultation for management of currently uncontrolled symptomatic diabetes requested by  Carlette [name], MD.   Past Medical History:  Diagnosis Date   Diabetes mellitus Type 1    lantus /novolog    Hyperlipidemia    Hypertension    Marijuana abuse    occaisionally   Noncompliance with medication regimen    Stage 4 chronic kidney disease (HCC)    Stroke (HCC) 2020   Tobacco abuse    5/day    Past Surgical History:  Procedure Laterality Date   AV FISTULA PLACEMENT Left 01/08/2021   Procedure: LEFT ARM ARTERIOVENOUS (AV) FISTULA CREATION;  Surgeon: Oris Krystal FALCON, MD;  Location: [facility];  Service: Vascular;  Laterality: Left;   EYE SURGERY      Social History   Socioeconomic History   Marital status: Single    Spouse name: Not on file   Number of children: Not on file   Years of education: Not on file   Highest education level: Not on file  Occupational History   Occupation: disabled  Tobacco Use   Smoking status: Every Day    Current packs/day: 0.50    Average packs/day: 0.5 packs/day for 8.0 years (4.0 ttl pk-yrs)    Types: Cigarettes   Smokeless tobacco: Never   Tobacco comments:    smoke 5-8 per day  Vaping Use   Vaping status: Never Used  Substance and Sexual Activity   Alcohol use: Never   Drug use: Yes    Types: Marijuana    Comment: last use yesterday, every other days    Sexual activity: Yes    Birth control/protection: Condom  Other Topics Concern   Not on file  Social History Narrative   Not on file   Social Drivers of Health   Tobacco Use: High Risk (09/10/2024)   Patient History    Smoking Tobacco Use: Every Day    Smokeless Tobacco Use: Never    Passive Exposure: Not  on file  Financial Resource Strain: Low Risk (01/17/2023)   Overall Financial Resource Strain (CARDIA)    Difficulty of Paying Living Expenses: Not hard at all  Food Insecurity: No Food Insecurity (08/10/2024)   Epic    Worried About Radiation Protection Practitioner of Food in the Last Year: Never true    Ran Out of Food in the Last Year: Never true  Transportation Needs: No Transportation Needs (08/10/2024)   Epic    Lack of Transportation (Medical): No    Lack of Transportation (Non-Medical): No  Physical Activity: Not on file  Stress: Not on file  Social Connections: Not on file  Depression (EYV7-0): Low Risk (09/07/2024)   Depression (PHQ2-9)    PHQ-2 Score: 0  Alcohol Screen: Not on file  Housing: Low Risk (08/10/2024)   Epic    Unable to Pay for Housing in  the Last Year: No    Number of Times Moved in the Last Year: 0    Homeless in the Last Year: No  Utilities: Not At Risk (08/10/2024)   Epic    Threatened with loss of utilities: No  Health Literacy: Not on file    Family History  Problem Relation Age of Onset   Diabetes Father    Kidney failure Father        last 4 mnths of life   Hypertension Mother    Diabetes Maternal Grandmother    Pancreatic cancer Maternal Grandfather    Diabetes Paternal Grandfather     Outpatient Encounter Medications as of 09/10/2024  Medication Sig   amLODipine  (NORVASC ) 10 MG tablet Take 1 tablet (10 mg total) by mouth daily. For blood pressure   ASPIRIN  LOW DOSE 81 MG EC tablet Take 81 mg by mouth every morning.   atorvastatin  (LIPITOR ) 80 MG tablet Take 1 tablet (80 mg total) by mouth every evening. For stroke prevention   clopidogrel  (PLAVIX ) 75 MG tablet Take 1 tablet (75 mg total) by mouth daily. For stroke prevention   Continuous Blood Gluc Transmit (DEXCOM G6 TRANSMITTER) MISC Check sugar 4 times a day   glucose blood (ONETOUCH ULTRA) test strip USE TO TEST BLOOD SUGAR 4 TIMES A DAY   Insulin  Disposable Pump (OMNIPOD 5 DEXG7G6 INTRO GEN 5) KIT  Change pod every 72 hours   Insulin  Pen Needle (PEN NEEDLES 3/16) 31G X 5 MM MISC Four times day   labetalol  (NORMODYNE ) 100 MG tablet Take 1 tablet (100 mg total) by mouth 2 (two) times daily. For BP   VELTASSA 8.4 g packet Take 1 packet by mouth 3 (three) times a week.   VITAMIN D  PO Take 25,000 Units by mouth once a week.   [DISCONTINUED] Continuous Glucose Sensor (DEXCOM G7 SENSOR) MISC Inject 1 Application into the skin as directed. Change sensor every 10 days as directed.   [DISCONTINUED] insulin  aspart (NOVOLOG ) 100 UNIT/ML injection Use with Omnipod for TDD around 50 units daily (Patient taking differently: Inject 100 Units into the skin every 3 (three) days. Use with Omnipod for TDD around 50 units daily)   [DISCONTINUED] Insulin  Disposable Pump (OMNIPOD 5 DEXG7G6 PODS GEN 5) MISC CHANGE POD EVERY 72 HOURS   Continuous Glucose Sensor (DEXCOM G7 SENSOR) MISC Inject 1 Application into the skin as directed. Change sensor every 10 days as directed.   insulin  aspart (NOVOLOG ) 100 UNIT/ML injection Use with Omnipod for TDD around 50 units daily   insulin  degludec (TRESIBA ) 100 UNIT/ML FlexTouch Pen Inject 14 Units into the skin at bedtime. (Patient not taking: Reported on 09/10/2024)   Insulin  Disposable Pump (OMNIPOD 5 DEXG7G6 PODS GEN 5) MISC Change pod every 72 hours   NOVOLOG  FLEXPEN 100 UNIT/ML FlexPen Inject 2-5 Units into the skin as needed (when Omnipod is not working). (Patient not taking: Reported on 09/10/2024)   No facility-administered encounter medications on file as of 09/10/2024.    ALLERGIES: Allergies  Allergen Reactions   Fructose Other (See Comments)    Increase of blood sugar Orange juice     VACCINATION STATUS: Immunization History  Administered Date(s) Administered   Influenza,inj,Quad PF,6+ Mos 06/08/2015, 06/11/2019, 07/22/2020   Moderna Sars-Covid-2 Vaccination 12/13/2019, 01/15/2020   Pneumococcal Polysaccharide-23 04/10/2013, 02/18/2018     Diabetes He presents for his follow-up diabetic visit. He has type 1 diabetes mellitus. Onset time: diagnosed at age 38. His disease course has been fluctuating. Hypoglycemia symptoms include nervousness/anxiousness,  seizures (mom reports seizure like activity when glucose drops really low), sweats and tremors. There are no diabetic associated symptoms. There are no hypoglycemic complications. Diabetic complications include a CVA, heart disease (had MI in the past) and nephropathy. (Multiple episodes of DKA, Gastroparesis) Risk factors for coronary artery disease include diabetes mellitus, dyslipidemia, family history, male sex and hypertension. Current diabetic treatment includes insulin  pump. He is compliant with treatment most of the time (has not been using in automated mode recently due to CGM issues). His weight is increasing steadily. He is following a generally unhealthy diet. When asked about meal planning, he reported none. He has not had a previous visit with a dietitian. He participates in exercise intermittently. His home blood glucose trend is fluctuating dramatically. (He presents today, accompanied by his mom, with his CGM and Omnipod showing no glucose readings going through his Omnipod. They have had trouble connecting his G7 to the Omnipod due to timing issues (having active pod, etc).   His POCT A1c today is 8.3%, unchanged from previous visit.  He has been putting in 99 grams of carbs for everything he eats, no matter how big or how small.  This has caused some hypoglycemia at times.  Analysis of his CGM shows TIR 49%, TAR 40%, TBR 11%.) An ACE inhibitor/angiotensin II receptor blocker is not being taken. He does not see a podiatrist.Eye exam is current.     Review of systems  Constitutional: + stable body weight, current Body mass index is 20.36 kg/m., no fatigue, no subjective hyperthermia, no subjective hypothermia Eyes: no blurry vision, no xerophthalmia ENT: no sore throat,  no nodules palpated in throat, no dysphagia/odynophagia, no hoarseness Cardiovascular: no chest pain, no shortness of breath, no palpitations, no leg swelling Respiratory: no cough, no shortness of breath Gastrointestinal: no nausea/vomiting/diarrhea Musculoskeletal: still has residual affects from previous CVA affecting mostly his right side Skin: no rashes, no hyperemia Neurological: no tremors, no numbness, no tingling, no dizziness Psychiatric: no depression, no anxiety  Objective:     BP 124/82 (BP Location: Right Arm, Patient Position: Sitting, Cuff Size: Large)   Pulse 68   Ht 5' 11 (1.803 m)   Wt 146 lb (66.2 kg)   BMI 20.36 kg/m   Wt Readings from Last 3 Encounters:  09/10/24 146 lb (66.2 kg)  08/10/24 136 lb 0.4 oz (61.7 kg)  05/23/24 136 lb (61.7 kg)     BP Readings from Last 3 Encounters:  09/10/24 124/82  09/07/24 (!) 182/108  08/11/24 (!) 153/90      Physical Exam- Limited  Constitutional:  Body mass index is 20.36 kg/m. , not in acute distress, normal state of mind Eyes:  EOMI, no exophthalmos Musculoskeletal: no gross deformities, strength intact in all four extremities, no gross restriction of joint movements Skin:  no rashes, no hyperemia Neurological: no tremor with outstretched hands  Diabetic Foot Exam - Simple   No data filed      CMP ( most recent) CMP     Component Value Date/Time   NA 135 08/11/2024 1312   K 3.5 08/11/2024 1312   CL 105 08/11/2024 1312   CO2 18 (L) 08/11/2024 1312   GLUCOSE 160 (H) 08/11/2024 1312   BUN 61 (H) 08/11/2024 1312   BUN 26 (A) 05/30/2023 0000   CREATININE 6.07 (H) 08/11/2024 1312   CALCIUM  8.1 (L) 08/11/2024 1312   PROT 8.1 08/10/2024 1437   ALBUMIN 4.5 08/10/2024 1437   AST 20 08/10/2024 1437  ALT 19 08/10/2024 1437   ALKPHOS 156 (H) 08/10/2024 1437   BILITOT 0.4 08/10/2024 1437   GFR 32.98 (L) 03/19/2020 1649   EGFR 27 05/30/2023 0000   GFRNONAA 11 (L) 08/11/2024 1312     Diabetic Labs  (most recent): Lab Results  Component Value Date   HGBA1C 8.3 (A) 09/10/2024   HGBA1C 8.3 (A) 05/23/2024   HGBA1C 8.4 (A) 01/18/2024     Lipid Panel ( most recent) Lipid Panel     Component Value Date/Time   CHOL 204 (H) 05/18/2021 1632   TRIG 142.0 05/18/2021 1632   HDL 37.60 (L) 05/18/2021 1632   CHOLHDL 5 05/18/2021 1632   VLDL 28.4 05/18/2021 1632   LDLCALC 138 (H) 05/18/2021 1632      Lab Results  Component Value Date   TSH 0.762 07/14/2020   TSH 0.97 03/06/2019   TSH 1.810 02/20/2018   TSH 0.938 04/28/2010           Assessment & Plan:   1) Type 1 diabetes mellitus with stage 4 chronic kidney disease (HCC)  He presents today, accompanied by his mom, with his CGM and Omnipod showing no glucose readings going through his Omnipod. They have had trouble connecting his G7 to the Omnipod due to timing issues (having active pod, etc).   His POCT A1c today is 8.3%, unchanged from previous visit.  He has been putting in 99 grams of carbs for everything he eats, no matter how big or how small.  This has caused some hypoglycemia at times.  Analysis of his CGM shows TIR 49%, TAR 40%, TBR 11%.  - ORION MOLE has currently uncontrolled symptomatic type 1 DM since 38 years of age.   -Recent labs reviewed.  - I had a long discussion with him about the progressive nature of diabetes and the pathology behind its complications. 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 These are all discussed in detail with him.  The following Lifestyle Medicine recommendations according to American College of Lifestyle Medicine [facility]) were discussed and offered to patient and he agrees to start the journey:  A. Whole Foods, Plant-based plate comprising of fruits and vegetables, plant-based proteins,  whole-grain carbohydrates was discussed in detail with the patient.   A list for source of those nutrients were also provided to the patient.  Patient will use only water or unsweetened tea for hydration. B.  The need to stay away from risky substances including alcohol, smoking; obtaining 7 to 9 hours of restorative sleep, at least 150 minutes of moderate intensity exercise weekly, the importance of healthy social connections,  and stress reduction techniques were discussed. C.  A full color page of  Calorie density of various food groups per pound showing examples of each food groups was provided to the patient.  - Nutritional counseling repeated/built upon at each appointment.  - The patient admits there is a room for improvement in their diet and drink choices. -  Suggestion is made for the patient to avoid simple carbohydrates from their diet including Cakes, Sweet Desserts / Pastries, Ice Cream, Soda (diet and regular), Sweet Tea, Candies, Chips, Cookies, Sweet Pastries, Store Bought Juices, Alcohol in Excess of 1-2 drinks a day, Artificial Sweeteners, Coffee Creamer, and Sugar-free Products. This will help patient to have stable blood glucose profile  and potentially avoid unintended weight gain.   - I encouraged the patient to switch to unprocessed or minimally processed complex starch and increased protein intake (animal or plant source), fruits, and vegetables.   - Patient is advised to stick to a routine mealtimes to eat 3 meals a day and avoid unnecessary snacks (to snack only to correct hypoglycemia).  - I have approached him with the following individualized plan to manage his diabetes and patient agrees:   -Based on no glucose readings to review, no changes will be made to his Omnipod settings today for safety purposes.  I did give him and his mom Julias contact information to reach out about troubleshooting recent issues with G7 and Omnipod.  I also sent Recardo an email for her to  follow up with them.  May benefit from switching to [facility] app on phone.  He is advised to continue monitoring blood glucose 4 times daily (using his CGM), and to call the clinic if he has readings less than 70 or above 300 for 3 tests in a row.   - he is warned not to take insulin  without proper monitoring per orders. - Adjustment parameters are given to him for hypo and hyperglycemia in writing.  -He is not a candidate for non insulin  therapies due to type 1 diagnosis.  - Specific targets for  A1c; LDL, HDL, and Triglycerides were discussed with the patient.  2) Blood Pressure /Hypertension:  his blood pressure is NOT controlled to target but he notes he has not been taking any of his medications recently due to nausea (suspect from MVI).   he is advised to restart/continue his current medications prescribed by his PCP.  3) Lipids/Hyperlipidemia:    There is no recent lipid panel to review.  he is advised to continue Lipitor  80 mg daily at bedtime.  Side effects and precautions discussed with him.  Will check lipid panel prior to next visit.  He notes he stopped taking his meds not too long ago due to nausea (suspect related to his MVI).  4)  Weight/Diet:  his Body mass index is 20.36 kg/m.  -  he is NOT a candidate for weight loss. I discussed with him the fact that loss of 5 - 10% of his  current body weight will have the most impact on his diabetes management.  Exercise, and detailed carbohydrates information provided  -  detailed on discharge instructions.  5) Chronic Care/Health Maintenance: -he is not on ACEI/ARB and is on Statin medications and is encouraged to initiate and continue to follow up with Ophthalmology, Dentist, Podiatrist at least yearly or according to recommendations, and advised to stay away from smoking. I have recommended yearly flu vaccine and pneumonia vaccine at least every 5 years; moderate intensity exercise for up to 150 minutes weekly; and sleep for at least 7  hours a day.  - he is advised to maintain close follow up with Fanta, [name] Demissie, MD for primary care needs, as well as his other providers for optimal and coordinated care.    I spent  33  minutes in the care of the patient today including review of labs from CMP, Lipids, Thyroid  Function, Hematology (current and previous including abstractions from other facilities); face-to-face time discussing  his blood glucose readings/logs, discussing hypoglycemia and hyperglycemia episodes and symptoms, medications doses, his options of short and long term treatment based on the latest standards of care / guidelines;  discussion about incorporating lifestyle medicine;  and documenting  the encounter. Risk reduction counseling performed per USPSTF guidelines to reduce obesity and cardiovascular risk factors.     Please refer to Patient Instructions for Blood Glucose Monitoring and Insulin /Medications Dosing Guide  in media tab for additional information. Please  also refer to  Patient Self Inventory in the Media  tab for reviewed elements of pertinent patient history.  Nathaniel [name] participated in the discussions, expressed understanding, and voiced agreement with the above plans.  All questions were answered to his satisfaction. he is encouraged to contact clinic should he have any questions or concerns prior to his return visit.     Follow up plan: - Return in about 3 months (around 12/09/2024) for Diabetes F/U with A1c in office, No previsit labs, Bring meter and logs.   Benton Rio, [facility] [address] [place], [place] [address] Phone: [phone] Fax: [phone]  09/10/2024, 3:34 PM    "

## 2024-09-11 ENCOUNTER — Ambulatory Visit

## 2024-09-11 VITALS — BP 180/103 | HR 86 | Temp 98.1°F | Resp 18

## 2024-09-11 DIAGNOSIS — N1832 Chronic kidney disease, stage 3b: Secondary | ICD-10-CM

## 2024-09-11 DIAGNOSIS — D631 Anemia in chronic kidney disease: Secondary | ICD-10-CM | POA: Diagnosis not present

## 2024-09-11 MED ORDER — ACETAMINOPHEN 325 MG PO TABS
650.0000 mg | ORAL_TABLET | Freq: Once | ORAL | Status: AC
  Administered 2024-09-11: 650 mg via ORAL

## 2024-09-11 MED ORDER — SODIUM CHLORIDE 0.9 % IV SOLN
200.0000 mg | Freq: Once | INTRAVENOUS | Status: AC
  Administered 2024-09-11: 200 mg via INTRAVENOUS
  Filled 2024-09-11: qty 10

## 2024-09-11 MED ORDER — DIPHENHYDRAMINE HCL 25 MG PO CAPS
25.0000 mg | ORAL_CAPSULE | Freq: Once | ORAL | Status: AC
  Administered 2024-09-11: 25 mg via ORAL

## 2024-09-11 NOTE — Progress Notes (Signed)
 Diagnosis: Iron  Deficiency Anemia  Provider:  Dennise Hoes, MD  Procedure: IV Infusion  IV Type: Peripheral, IV Location: R Forearm  Venofer  (Iron  Sucrose), Dose: 200 mg  Infusion Start Time: 1330  Infusion Stop Time: 1347  Post Infusion IV Care: Observation period completed and Peripheral IV Discontinued  Discharge: Condition: Good, Destination: Home . AVS Declined  Performed by:  Delon ONEIDA Officer, RN

## 2024-09-16 ENCOUNTER — Other Ambulatory Visit: Payer: Self-pay

## 2024-09-16 ENCOUNTER — Encounter (HOSPITAL_COMMUNITY): Payer: Self-pay

## 2024-09-16 ENCOUNTER — Inpatient Hospital Stay (HOSPITAL_COMMUNITY)
Admission: EM | Admit: 2024-09-16 | Discharge: 2024-09-20 | DRG: 637 | Disposition: A | Discharge status: Home Health | Attending: Internal Medicine | Admitting: Internal Medicine

## 2024-09-16 DIAGNOSIS — Z7982 Long term (current) use of aspirin: Secondary | ICD-10-CM

## 2024-09-16 DIAGNOSIS — Z8674 Personal history of sudden cardiac arrest: Secondary | ICD-10-CM

## 2024-09-16 DIAGNOSIS — D72829 Elevated white blood cell count, unspecified: Secondary | ICD-10-CM | POA: Diagnosis present

## 2024-09-16 DIAGNOSIS — E108 Type 1 diabetes mellitus with unspecified complications: Secondary | ICD-10-CM | POA: Diagnosis present

## 2024-09-16 DIAGNOSIS — Z7902 Long term (current) use of antithrombotics/antiplatelets: Secondary | ICD-10-CM

## 2024-09-16 DIAGNOSIS — D631 Anemia in chronic kidney disease: Secondary | ICD-10-CM | POA: Diagnosis present

## 2024-09-16 DIAGNOSIS — E875 Hyperkalemia: Secondary | ICD-10-CM | POA: Diagnosis not present

## 2024-09-16 DIAGNOSIS — E1051 Type 1 diabetes mellitus with diabetic peripheral angiopathy without gangrene: Secondary | ICD-10-CM | POA: Diagnosis present

## 2024-09-16 DIAGNOSIS — E1110 Type 2 diabetes mellitus with ketoacidosis without coma: Secondary | ICD-10-CM | POA: Diagnosis present

## 2024-09-16 DIAGNOSIS — I10 Essential (primary) hypertension: Secondary | ICD-10-CM | POA: Diagnosis present

## 2024-09-16 DIAGNOSIS — Z91148 Patient's other noncompliance with medication regimen for other reason: Secondary | ICD-10-CM

## 2024-09-16 DIAGNOSIS — E782 Mixed hyperlipidemia: Secondary | ICD-10-CM | POA: Diagnosis present

## 2024-09-16 DIAGNOSIS — Z9641 Presence of insulin pump (external) (internal): Secondary | ICD-10-CM | POA: Diagnosis present

## 2024-09-16 DIAGNOSIS — N185 Chronic kidney disease, stage 5: Secondary | ICD-10-CM | POA: Diagnosis present

## 2024-09-16 DIAGNOSIS — Z794 Long term (current) use of insulin: Secondary | ICD-10-CM

## 2024-09-16 DIAGNOSIS — N179 Acute kidney failure, unspecified: Secondary | ICD-10-CM | POA: Diagnosis not present

## 2024-09-16 DIAGNOSIS — Z8249 Family history of ischemic heart disease and other diseases of the circulatory system: Secondary | ICD-10-CM

## 2024-09-16 DIAGNOSIS — F17210 Nicotine dependence, cigarettes, uncomplicated: Secondary | ICD-10-CM | POA: Diagnosis present

## 2024-09-16 DIAGNOSIS — F32A Depression, unspecified: Secondary | ICD-10-CM | POA: Diagnosis present

## 2024-09-16 DIAGNOSIS — E1022 Type 1 diabetes mellitus with diabetic chronic kidney disease: Secondary | ICD-10-CM | POA: Diagnosis present

## 2024-09-16 DIAGNOSIS — Z91018 Allergy to other foods: Secondary | ICD-10-CM

## 2024-09-16 DIAGNOSIS — E1010 Type 1 diabetes mellitus with ketoacidosis without coma: Secondary | ICD-10-CM | POA: Diagnosis not present

## 2024-09-16 DIAGNOSIS — E860 Dehydration: Secondary | ICD-10-CM | POA: Diagnosis present

## 2024-09-16 DIAGNOSIS — R7989 Other specified abnormal findings of blood chemistry: Secondary | ICD-10-CM | POA: Diagnosis not present

## 2024-09-16 DIAGNOSIS — Z833 Family history of diabetes mellitus: Secondary | ICD-10-CM

## 2024-09-16 DIAGNOSIS — Z800 Family history of malignant neoplasm of digestive organs: Secondary | ICD-10-CM

## 2024-09-16 DIAGNOSIS — Z79899 Other long term (current) drug therapy: Secondary | ICD-10-CM

## 2024-09-16 DIAGNOSIS — G9341 Metabolic encephalopathy: Secondary | ICD-10-CM | POA: Diagnosis present

## 2024-09-16 DIAGNOSIS — I120 Hypertensive chronic kidney disease with stage 5 chronic kidney disease or end stage renal disease: Secondary | ICD-10-CM | POA: Diagnosis present

## 2024-09-16 DIAGNOSIS — Z8673 Personal history of transient ischemic attack (TIA), and cerebral infarction without residual deficits: Secondary | ICD-10-CM

## 2024-09-16 DIAGNOSIS — F17200 Nicotine dependence, unspecified, uncomplicated: Secondary | ICD-10-CM | POA: Diagnosis present

## 2024-09-16 HISTORY — DX: Cardiac arrest, cause unspecified: I46.9

## 2024-09-16 LAB — BASIC METABOLIC PANEL WITH GFR
Anion gap: 27 — ABNORMAL HIGH (ref 5–15)
BUN: 77 mg/dL — ABNORMAL HIGH (ref 6–20)
BUN: 76 mg/dL — ABNORMAL HIGH (ref 6–20)
CO2: <7 mmol/L — ABNORMAL LOW (ref 22–32)
CO2: 14 mmol/L — ABNORMAL LOW (ref 22–32)
Calcium: 8.2 mg/dL — ABNORMAL LOW (ref 8.9–10.3)
Calcium: 7.9 mg/dL — ABNORMAL LOW (ref 8.9–10.3)
Chloride: 84 mmol/L — ABNORMAL LOW (ref 98–111)
Chloride: 91 mmol/L — ABNORMAL LOW (ref 98–111)
Creatinine, Ser: 5.82 mg/dL — ABNORMAL HIGH (ref 0.61–1.24)
Creatinine, Ser: 5.94 mg/dL — ABNORMAL HIGH (ref 0.61–1.24)
GFR, Estimated: 12 mL/min — ABNORMAL LOW
GFR, Estimated: 12 mL/min — ABNORMAL LOW
Glucose, Bld: 810 mg/dL (ref 70–99)
Glucose, Bld: 525 mg/dL (ref 70–99)
Potassium: 5.5 mmol/L — ABNORMAL HIGH (ref 3.5–5.1)
Potassium: 4.2 mmol/L (ref 3.5–5.1)
Sodium: 128 mmol/L — ABNORMAL LOW (ref 135–145)
Sodium: 132 mmol/L — ABNORMAL LOW (ref 135–145)

## 2024-09-16 LAB — CBC
HCT: 34.6 % — ABNORMAL LOW (ref 39.0–52.0)
Hemoglobin: 11.1 g/dL — ABNORMAL LOW (ref 13.0–17.0)
MCH: 30.2 pg (ref 26.0–34.0)
MCHC: 32.1 g/dL (ref 30.0–36.0)
MCV: 94 fL (ref 80.0–100.0)
Platelets: 267 K/uL (ref 150–400)
RBC: 3.68 MIL/uL — ABNORMAL LOW (ref 4.22–5.81)
RDW: 16.2 % — ABNORMAL HIGH (ref 11.5–15.5)
WBC: 20.3 K/uL — ABNORMAL HIGH (ref 4.0–10.5)
nRBC: 0 % (ref 0.0–0.2)

## 2024-09-16 LAB — CBG MONITORING, ED
Glucose-Capillary: >600 mg/dL (ref 70–99)
Glucose-Capillary: >600 mg/dL (ref 70–99)
Glucose-Capillary: >600 mg/dL (ref 70–99)
Glucose-Capillary: 481 mg/dL — ABNORMAL HIGH (ref 70–99)
Glucose-Capillary: 532 mg/dL (ref 70–99)
Glucose-Capillary: 445 mg/dL — ABNORMAL HIGH (ref 70–99)
Glucose-Capillary: 489 mg/dL — ABNORMAL HIGH (ref 70–99)

## 2024-09-16 LAB — BLOOD GAS, VENOUS
Acid-base deficit: 14.9 mmol/L — ABNORMAL HIGH (ref 0.0–2.0)
Bicarbonate: 11.6 mmol/L — ABNORMAL LOW (ref 20.0–28.0)
Drawn by: 7049
O2 Saturation: 73.8 %
Patient temperature: 36.8
pCO2, Ven: 29 mmHg — ABNORMAL LOW (ref 44–60)
pH, Ven: 7.21 — ABNORMAL LOW (ref 7.25–7.43)
pO2, Ven: 44 mmHg (ref 32–45)

## 2024-09-16 LAB — BETA-HYDROXYBUTYRIC ACID: Beta-Hydroxybutyric Acid: 7.62 mmol/L — ABNORMAL HIGH (ref 0.05–0.27)

## 2024-09-16 MED ORDER — DEXTROSE 50 % IV SOLN: 0.0000 mL | INTRAVENOUS | Status: DC | PRN

## 2024-09-16 MED ORDER — INSULIN REGULAR(HUMAN) IN NACL 100-0.9 UT/100ML-% IV SOLN
INTRAVENOUS | Status: DC
  Administered 2024-09-16: 5.5 [IU]/h via INTRAVENOUS
  Filled 2024-09-16: qty 100

## 2024-09-16 MED ORDER — DEXTROSE IN LACTATED RINGERS 5 % IV SOLN: INTRAVENOUS | Status: DC

## 2024-09-16 MED ORDER — LACTATED RINGERS IV SOLN: INTRAVENOUS | Status: DC

## 2024-09-16 MED ORDER — LACTATED RINGERS IV BOLUS
20.0000 mL/kg | Freq: Once | INTRAVENOUS | Status: AC
  Administered 2024-09-16: 1270 mL via INTRAVENOUS

## 2024-09-16 NOTE — ED Provider Notes (Signed)
 " [place] EMERGENCY DEPARTMENT AT [facility] Provider Note   CSN: [id number] Arrival date & time: 09/16/24  1744     Patient presents with: Hyperglycemia   Nathaniel [name] is a 38 y.o. male.    Hyperglycemia Blood sugar.  Reportedly high CBG at home today potentially yesterday.  Reportedly has been taking his insulin  but not his other medicines.  Has a history of noncompliance.  He had recent admission for the same.  Patient is a type I diabetic.    Past Medical History:  Diagnosis Date   Cardiac arrest (HCC)    Diabetes mellitus Type 1    lantus /novolog    Hyperlipidemia    Hypertension    Marijuana abuse    occaisionally   Noncompliance with medication regimen    Stage 4 chronic kidney disease (HCC)    Stroke (HCC) 2020   Tobacco abuse    5/day    Prior to Admission medications  Medication Sig Start Date End Date Taking? Authorizing Provider  amLODipine  (NORVASC ) 10 MG tablet Take 1 tablet (10 mg total) by mouth daily. For blood pressure 07/16/20   Emokpae, Courage, MD  ASPIRIN  LOW DOSE 81 MG EC tablet Take 81 mg by mouth every morning. 10/21/21   [provider]  atorvastatin  (LIPITOR ) 80 MG tablet Take 1 tablet (80 mg total) by mouth every evening. For stroke prevention 07/16/20   Pearlean Manus, MD  clopidogrel  (PLAVIX ) 75 MG tablet Take 1 tablet (75 mg total) by mouth daily. For stroke prevention 07/16/20   Pearlean Manus, MD  Continuous Blood Gluc Transmit (DEXCOM G6 TRANSMITTER) MISC Check sugar 4 times a day 07/21/21   Von Pacific, MD  Continuous Glucose Sensor (DEXCOM G7 SENSOR) MISC Inject 1 Application into the skin as directed. Change sensor every 10 days as directed. 09/10/24   Therisa Benton PARAS, NP  glucose blood (ONETOUCH ULTRA) test strip USE TO TEST BLOOD SUGAR 4 TIMES A DAY 12/14/22   Von Pacific, MD  insulin  aspart (NOVOLOG ) 100 UNIT/ML injection Use with Omnipod for TDD around 50 units daily 09/10/24   Therisa Benton PARAS, NP   insulin  degludec (TRESIBA ) 100 UNIT/ML FlexTouch Pen Inject 14 Units into the skin at bedtime. Patient not taking: Reported on 09/10/2024 08/11/24   Maree Bracken D, DO  Insulin  Disposable Pump (OMNIPOD 5 DEXG7G6 INTRO GEN 5) KIT Change pod every 72 hours 07/19/23   Therisa Benton PARAS, NP  Insulin  Disposable Pump (OMNIPOD 5 DEXG7G6 PODS GEN 5) MISC Change pod every 72 hours 09/10/24   Therisa Benton PARAS, NP  Insulin  Pen Needle (PEN NEEDLES 3/16) 31G X 5 MM MISC Four times day 08/11/24   Maree, Pratik D, DO  labetalol  (NORMODYNE ) 100 MG tablet Take 1 tablet (100 mg total) by mouth 2 (two) times daily. For BP 07/16/20   Emokpae, Courage, MD  NOVOLOG  FLEXPEN 100 UNIT/ML FlexPen Inject 2-5 Units into the skin as needed (when Omnipod is not working). Patient not taking: Reported on 09/10/2024 06/20/24   [provider]  VELTASSA 8.4 g packet Take 1 packet by mouth 3 (three) times a week. 06/20/24   [provider]  VITAMIN D  PO Take 25,000 Units by mouth once a week.    [provider]    Allergies: Fructose    Review of Systems  Updated Vital Signs BP (!) 157/87   Pulse (!) 108   Temp 98.2 F (36.8 C) (Oral)   Resp 17   Ht 5' 11 (1.803  m)   Wt 63.5 kg   SpO2 99%   BMI 19.53 kg/m   Physical Exam Vitals and nursing note reviewed.  Cardiovascular:     Rate and Rhythm: Tachycardia present.  Abdominal:     Tenderness: There is no abdominal tenderness.  Musculoskeletal:        General: No tenderness.  Skin:    Coloration: Skin is not jaundiced.  Neurological:     Mental Status: He is alert.     Comments: May have some confusion.     (all labs ordered are listed, but only abnormal results are displayed) Labs Reviewed  CBC - Abnormal; Notable for the following components:      Result Value   WBC 20.3 (*)    RBC 3.68 (*)    Hemoglobin 11.1 (*)    HCT 34.6 (*)    RDW 16.2 (*)    All other components within normal limits  BASIC METABOLIC PANEL WITH  GFR - Abnormal; Notable for the following components:   Sodium 128 (*)    Potassium 5.5 (*)    Chloride 84 (*)    CO2 <7 (*)    Glucose, Bld 810 (*)    BUN 77 (*)    Creatinine, Ser 5.82 (*)    Calcium  8.2 (*)    GFR, Estimated 12 (*)    All other components within normal limits  BETA-HYDROXYBUTYRIC ACID - Abnormal; Notable for the following components:   Beta-Hydroxybutyric Acid 7.62 (*)    All other components within normal limits  BLOOD GAS, VENOUS - Abnormal; Notable for the following components:   pH, Ven 7.21 (*)    pCO2, Ven 29 (*)    Bicarbonate 11.6 (*)    Acid-base deficit 14.9 (*)    All other components within normal limits  CBG MONITORING, ED - Abnormal; Notable for the following components:   Glucose-Capillary >600 (*)    All other components within normal limits  CBG MONITORING, ED - Abnormal; Notable for the following components:   Glucose-Capillary >600 (*)    All other components within normal limits  CBG MONITORING, ED - Abnormal; Notable for the following components:   Glucose-Capillary >600 (*)    All other components within normal limits  CBG MONITORING, ED - Abnormal; Notable for the following components:   Glucose-Capillary 532 (*)    All other components within normal limits  CBG MONITORING, ED - Abnormal; Notable for the following components:   Glucose-Capillary 481 (*)    All other components within normal limits  CBG MONITORING, ED - Abnormal; Notable for the following components:   Glucose-Capillary 489 (*)    All other components within normal limits  URINALYSIS, ROUTINE W REFLEX MICROSCOPIC  BASIC METABOLIC PANEL WITH GFR  BASIC METABOLIC PANEL WITH GFR  BASIC METABOLIC PANEL WITH GFR  BETA-HYDROXYBUTYRIC ACID  BETA-HYDROXYBUTYRIC ACID  BETA-HYDROXYBUTYRIC ACID  BASIC METABOLIC PANEL WITH GFR  BETA-HYDROXYBUTYRIC ACID    EKG: None  Radiology: No results found.   Procedures   Medications Ordered in the ED  insulin  regular, human  (MYXREDLIN ) 100 units/ 100 mL infusion (5.5 Units/hr Intravenous New Bag/Given 09/16/24 2058)  lactated ringers  infusion ( Intravenous New Bag/Given 09/16/24 2059)  dextrose  5 % in lactated ringers  infusion (0 mLs Intravenous Hold 09/16/24 2059)  dextrose  50 % solution 0-50 mL (has no administration in time range)  lactated ringers  bolus 1,270 mL (0 mLs Intravenous Stopped 09/16/24 2059)  Medical Decision Making Amount and/or Complexity of Data Reviewed Labs: ordered.  Risk Prescription drug management. Decision regarding hospitalization.   Patient hyperglycemia.  History of DKA.  He is a type I diabetic.  History of noncompliance.  Sugars somewhat over 600.  I think likely in DKA again.  Will get blood work.  Will give fluid bolus empirically.  White count is elevated.  Creatinine also elevated but also appears to chronic elevated.  Has a pH of 7.21.  Appears to be in DKA.  Will require admission.  Will discuss with hospitalist.     CRITICAL CARE Performed by: [place] Total critical care time: 30 minutes Critical care time was exclusive of separately billable procedures and treating other patients. Critical care was necessary to treat or prevent imminent or life-threatening deterioration. Critical care was time spent personally by me on the following activities: development of treatment plan with patient and/or surrogate as well as nursing, discussions with consultants, evaluation of patient's response to treatment, examination of patient, obtaining history from patient or surrogate, ordering and performing treatments and interventions, ordering and review of laboratory studies, ordering and review of radiographic studies, pulse oximetry and re-evaluation of patient's condition.      Final diagnoses:  Diabetic ketoacidosis without coma associated with type 1 diabetes mellitus [facility])    ED Discharge Orders     None           River Rankin, MD 09/16/24 2317  "

## 2024-09-16 NOTE — ED Triage Notes (Addendum)
 Pt presents with hyperglycemia stating his monitor is reading high. Pt was recently in the hospital for DKA. Pt has not been taking any of his home medications for 1 week except for insulin . Pt states he doesn't like taking medication.

## 2024-09-16 NOTE — H&P (Incomplete)
 " History and Physical    Patient: Nathaniel [name] FMW:[id number] DOB: 1985/10/08 DOA: 09/16/2024 DOS: the patient was seen and examined on 09/16/2024 PCP: Carlette [name], MD  Patient coming from: Home  Chief Complaint:  Chief Complaint  Patient presents with   Hyperglycemia   HPI: Nathaniel [name] is a 38 y.o. male with medical history significant of hypertension, hyperlipidemia, CVA, CKD 3B, type 1 diabetes mellitus, on insulin  pump at home, history of cardiac arrest in the past due to DKA/electrolyte derangement, necrotizing fasciitis last year, history of paresis who presents emergency department due to high levels of glucose in blood.  At bedside, patient was somnolent, was arousable, woke her glucose back to sleep.[name]  History was obtained from EDP and mother at bedside.  Per report, patient's blood glucose was reading high .  It was reported that patient has been taking his insulin  without taking other medications.  He presented with several episodes of nonbloody vomiting this morning, but no report of fever, chills, chest pain, shortness of breath.  He was recently admitted from 11/21 to 11/22 due to diabetes mellitus type 1, poorly controlled with severe DKA  ED course In the emergency department, he was tachycardic, BP was elevated at 170/97.  Workup in the ED showed WBC of 20.3 and normocytic anemia.  BMP sodium 128, potassium 5.5, chloride 84, bicarb less than 7, blood glucose 810, BUN 77, creatinine 5.82, calcium  8.2, eGFR 12. Patient was started on insulin  drip per Endo tool.  TRH was asked to admit patient.   Review of Systems: As mentioned in the history of present illness. All other systems reviewed and are negative. Past Medical History:  Diagnosis Date   Cardiac arrest (HCC)    Diabetes mellitus Type 1    lantus /novolog    Hyperlipidemia    Hypertension    Marijuana abuse    occaisionally   Noncompliance with medication regimen    Stage 4  chronic kidney disease (HCC)    Stroke (HCC) 2020   Tobacco abuse    5/day   Past Surgical History:  Procedure Laterality Date   AV FISTULA PLACEMENT Left 01/08/2021   Procedure: LEFT ARM ARTERIOVENOUS (AV) FISTULA CREATION;  Surgeon: Oris Krystal FALCON, MD;  Location: [facility];  Service: Vascular;  Laterality: Left;   EYE SURGERY     Social History:  reports that he has been smoking cigarettes. He has a 4 pack-year smoking history. He has never used smokeless tobacco. He reports current drug use. Drug: Marijuana. He reports that he does not drink alcohol.  Allergies[1]  Family History  Problem Relation Age of Onset   Diabetes Father    Kidney failure Father        last 4 mnths of life   Hypertension Mother    Diabetes Maternal Grandmother    Pancreatic cancer Maternal Grandfather    Diabetes Paternal Grandfather     Prior to Admission medications  Medication Sig Start Date End Date Taking? Authorizing Provider  amLODipine  (NORVASC ) 10 MG tablet Take 1 tablet (10 mg total) by mouth daily. For blood pressure 07/16/20   Emokpae, Courage, MD  ASPIRIN  LOW DOSE 81 MG EC tablet Take 81 mg by mouth every morning. 10/21/21   [provider]  atorvastatin  (LIPITOR ) 80 MG tablet Take 1 tablet (80 mg total) by mouth every evening. For stroke prevention 07/16/20   Pearlean Manus, MD  clopidogrel  (PLAVIX ) 75 MG tablet Take 1 tablet (75 mg total) by mouth daily. For  stroke prevention 07/16/20   Pearlean Manus, MD  Continuous Blood Gluc Transmit (DEXCOM G6 TRANSMITTER) MISC Check sugar 4 times a day 07/21/21   Von Pacific, MD  Continuous Glucose Sensor (DEXCOM G7 SENSOR) MISC Inject 1 Application into the skin as directed. Change sensor every 10 days as directed. 09/10/24   Therisa Benton PARAS, NP  glucose blood (ONETOUCH ULTRA) test strip USE TO TEST BLOOD SUGAR 4 TIMES A DAY 12/14/22   Von [place], MD  insulin  aspart (NOVOLOG ) 100 UNIT/ML injection Use with Omnipod for TDD around 50  units daily 09/10/24   Therisa Benton PARAS, NP  insulin  degludec (TRESIBA ) 100 UNIT/ML FlexTouch Pen Inject 14 Units into the skin at bedtime. Patient not taking: Reported on 09/10/2024 08/11/24   Maree Bracken D, DO  Insulin  Disposable Pump (OMNIPOD 5 DEXG7G6 INTRO GEN 5) KIT Change pod every 72 hours 07/19/23   Therisa Benton PARAS, NP  Insulin  Disposable Pump (OMNIPOD 5 DEXG7G6 PODS GEN 5) MISC Change pod every 72 hours 09/10/24   Therisa Benton PARAS, NP  Insulin  Pen Needle (PEN NEEDLES 3/16) 31G X 5 MM MISC Four times day 08/11/24   Maree, Pratik D, DO  labetalol  (NORMODYNE ) 100 MG tablet Take 1 tablet (100 mg total) by mouth 2 (two) times daily. For BP 07/16/20   Emokpae, Courage, MD  NOVOLOG  FLEXPEN 100 UNIT/ML FlexPen Inject 2-5 Units into the skin as needed (when Omnipod is not working). Patient not taking: Reported on 09/10/2024 06/20/24   [provider]  VELTASSA 8.4 g packet Take 1 packet by mouth 3 (three) times a week. 06/20/24   [provider]  VITAMIN D  PO Take 25,000 Units by mouth once a week.    [provider]    Physical Exam: Vitals:   09/16/24 1817 09/16/24 1819 09/16/24 1938  BP:  (!) 170/97 (!) 179/107  Pulse:  (!) 130 (!) 113  Resp:  17 18  Temp:  98.2 F (36.8 C)   TempSrc:  Oral   SpO2:  100% 100%  Weight: 63.5 kg    Height: 5' 11 (1.803 m)     General: Somnolent, but easily arousable and quickly goes back to sleep. Not in any acute distress.  HEENT: NCAT.  PERRL. Sclerae anicteric.  Dry mucosal membranes. Neck: Neck supple without lymphadenopathy. No carotid bruits. No masses palpated.  Cardiovascular: Regular rate with normal S1-S2 sounds. No murmurs, rubs or gallops auscultated. No JVD.  Respiratory: Clear breath sounds.  No accessory muscle use. Abdomen: Soft, nontender, nondistended. Active bowel sounds. No masses or hepatosplenomegaly  Skin: No rashes, lesions, or ulcerations.  Dry, warm to touch. Musculoskeletal:  2+ dorsalis  pedis and radial pulses. Good ROM.  No contractures  Psychiatric: Mood appropriate to current condition. Neurologic: No focal neurological deficits.   Assessment and Plan:  High Anion gap metabolic acidosis secondary to DKA Hyperglycemia secondary to poorly controlled type 1 diabetes mellitus pH 7.21, bicarb < 7, beta hydroxybutyric acid 7.62 Continue insulin  drip, IV LR with IV potassium per DKA protocol Transition IV LR to D5 LR when serum glucose reaches 250mg /dL Continue serial BMP and VBG Continue to monitor for anion gap closure prior to transitioning patient to subcu insulin  Continue NPO  Hyperkalemia K+ 5.5, continue insulin  drip per Endo tool.  Potassium level will eventually drop with this Continue to monitor  Acute kidney injury superimposed on CKD stage 5 Creatinine 5.82, eGFR 12, patient is at risk of going on dialysis He still produces urine, continue  hydration per DKA protocol Renally adjust medications, avoid nephrotoxic agents/dehydration/hypotension  Malignant hypertension -blood pressure significantly elevated he did not receive his meds this morning, will give him his amlodipine  and labetalol  dose stat will add as needed hydralazine    HLD:  - Continue home atorvastatin .   Multiple prior CVAs - Continue with Plavix , statin   Pseudo hyponatremia -Hyperglycemia   Leukocytosis -Stress related, due to DKA       Advance Care Planning:   Code Status: Prior ***  Consults: ***  Family Communication: ***  Severity of Illness: {Observation/Inpatient:21159}  Author: Posey Maier, DO 09/16/2024 8:59 PM  For on call review [email or web address].       [1] Allergies Allergen Reactions   Fructose Other (See Comments)    Increase of blood sugar Orange juice   "

## 2024-09-16 NOTE — H&P (Signed)
 " History and Physical    Patient: Nathaniel [name] FMW:[id number] DOB: May 08, 1986 DOA: 09/16/2024 DOS: the patient was seen and examined on 09/16/2024 PCP: Carlette [name], MD  Patient coming from: Home  Chief Complaint:  Chief Complaint  Patient presents with   Hyperglycemia   HPI: Nathaniel [name] is a 38 y.o. male with medical history significant of hypertension, hyperlipidemia, CVA, CKD 3B, type 1 diabetes mellitus, on insulin  pump at home, history of cardiac arrest in the past due to DKA/electrolyte derangement, necrotizing fasciitis last year, history of paresis who presents emergency department due to high levels of glucose in blood.  At bedside, patient was somnolent, was arousable, woke her glucose back to sleep.[name]  History was obtained from EDP and mother at bedside.  Per report, patient's blood glucose was reading high .  It was reported that patient has been taking his insulin  without taking other medications.  He presented with several episodes of nonbloody vomiting this morning, but no report of fever, chills, chest pain, shortness of breath.  He was recently admitted from 11/21 to 11/22 due to diabetes mellitus type 1, poorly controlled with severe DKA  ED course In the emergency department, he was tachycardic, BP was elevated at 170/97.  Workup in the ED showed WBC of 20.3 and normocytic anemia.  BMP sodium 128, potassium 5.5, chloride 84, bicarb less than 7, blood glucose 810, BUN 77, creatinine 5.82, calcium  8.2, eGFR 12. Patient was started on insulin  drip per Endo tool.  TRH was asked to admit patient.   Review of Systems: As mentioned in the history of present illness. All other systems reviewed and are negative. Past Medical History:  Diagnosis Date   Cardiac arrest (HCC)    Diabetes mellitus Type 1    lantus /novolog    Hyperlipidemia    Hypertension    Marijuana abuse    occaisionally   Noncompliance with medication regimen    Stage 4 chronic  kidney disease (HCC)    Stroke (HCC) 2020   Tobacco abuse    5/day   Past Surgical History:  Procedure Laterality Date   AV FISTULA PLACEMENT Left 01/08/2021   Procedure: LEFT ARM ARTERIOVENOUS (AV) FISTULA CREATION;  Surgeon: Oris Krystal FALCON, MD;  Location: [facility];  Service: Vascular;  Laterality: Left;   EYE SURGERY     Social History:  reports that he has been smoking cigarettes. He has a 4 pack-year smoking history. He has never used smokeless tobacco. He reports current drug use. Drug: Marijuana. He reports that he does not drink alcohol.  Allergies[1]  Family History  Problem Relation Age of Onset   Diabetes Father    Kidney failure Father        last 4 mnths of life   Hypertension Mother    Diabetes Maternal Grandmother    Pancreatic cancer Maternal Grandfather    Diabetes Paternal Grandfather     Prior to Admission medications  Medication Sig Start Date End Date Taking? Authorizing Provider  amLODipine  (NORVASC ) 10 MG tablet Take 1 tablet (10 mg total) by mouth daily. For blood pressure 07/16/20   Emokpae, Courage, MD  ASPIRIN  LOW DOSE 81 MG EC tablet Take 81 mg by mouth every morning. 10/21/21   [provider]  atorvastatin  (LIPITOR ) 80 MG tablet Take 1 tablet (80 mg total) by mouth every evening. For stroke prevention 07/16/20   Pearlean Manus, MD  clopidogrel  (PLAVIX ) 75 MG tablet Take 1 tablet (75 mg total) by mouth daily. For  stroke prevention 07/16/20   Pearlean Manus, MD  Continuous Blood Gluc Transmit (DEXCOM G6 TRANSMITTER) MISC Check sugar 4 times a day 07/21/21   Von Pacific, MD  Continuous Glucose Sensor (DEXCOM G7 SENSOR) MISC Inject 1 Application into the skin as directed. Change sensor every 10 days as directed. 09/10/24   Therisa Benton PARAS, NP  glucose blood (ONETOUCH ULTRA) test strip USE TO TEST BLOOD SUGAR 4 TIMES A DAY 12/14/22   Von [place], MD  insulin  aspart (NOVOLOG ) 100 UNIT/ML injection Use with Omnipod for TDD around 50 units daily  09/10/24   Therisa Benton PARAS, NP  insulin  degludec (TRESIBA ) 100 UNIT/ML FlexTouch Pen Inject 14 Units into the skin at bedtime. Patient not taking: Reported on 09/10/2024 08/11/24   Maree Bracken D, DO  Insulin  Disposable Pump (OMNIPOD 5 DEXG7G6 INTRO GEN 5) KIT Change pod every 72 hours 07/19/23   Therisa Benton PARAS, NP  Insulin  Disposable Pump (OMNIPOD 5 DEXG7G6 PODS GEN 5) MISC Change pod every 72 hours 09/10/24   Therisa Benton PARAS, NP  Insulin  Pen Needle (PEN NEEDLES 3/16) 31G X 5 MM MISC Four times day 08/11/24   Maree, Pratik D, DO  labetalol  (NORMODYNE ) 100 MG tablet Take 1 tablet (100 mg total) by mouth 2 (two) times daily. For BP 07/16/20   Emokpae, Courage, MD  NOVOLOG  FLEXPEN 100 UNIT/ML FlexPen Inject 2-5 Units into the skin as needed (when Omnipod is not working). Patient not taking: Reported on 09/10/2024 06/20/24   [provider]  VELTASSA 8.4 g packet Take 1 packet by mouth 3 (three) times a week. 06/20/24   [provider]  VITAMIN D  PO Take 25,000 Units by mouth once a week.    [provider]    Physical Exam: Vitals:   09/16/24 1817 09/16/24 1819 09/16/24 1938  BP:  (!) 170/97 (!) 179/107  Pulse:  (!) 130 (!) 113  Resp:  17 18  Temp:  98.2 F (36.8 C)   TempSrc:  Oral   SpO2:  100% 100%  Weight: 63.5 kg    Height: 5' 11 (1.803 m)     General: Somnolent, but easily arousable and quickly goes back to sleep. Not in any acute distress.  HEENT: NCAT.  PERRL. Sclerae anicteric.  Dry mucosal membranes. Neck: Neck supple without lymphadenopathy. No carotid bruits. No masses palpated.  Cardiovascular: Regular rate with normal S1-S2 sounds. No murmurs, rubs or gallops auscultated. No JVD.  Respiratory: Clear breath sounds.  No accessory muscle use. Abdomen: Soft, nontender, nondistended. Active bowel sounds. No masses or hepatosplenomegaly  Skin: No rashes, lesions, or ulcerations.  Dry, warm to touch. Musculoskeletal:  2+ dorsalis pedis and  radial pulses. Good ROM.  No contractures  Psychiatric: Mood appropriate to current condition. Neurologic: No focal neurological deficits.   Assessment and Plan:  High Anion gap metabolic acidosis secondary to DKA type I Hyperglycemia secondary to poorly controlled type 1 diabetes mellitus pH 7.21, bicarb < 7, beta hydroxybutyric acid 7.62 Continue insulin  drip, IV LR with IV potassium per DKA protocol Transition IV LR to D5 LR when serum glucose reaches 250mg /dL Continue serial BMP and VBG Continue to monitor for anion gap closure prior to transitioning patient to subcu insulin  Continue NPO  Hyperkalemia K+ 5.5, continue insulin  drip per Endo tool.  Potassium level will eventually drop with this Continue to monitor  Acute kidney injury superimposed on CKD stage 5 Creatinine 5.82, eGFR 12, patient is at risk of going on dialysis He still produces  urine, continue hydration per DKA protocol Renally adjust medications, avoid nephrotoxic agents/dehydration/hypotension Consider nephrology consult  Malignant hypertension Blood pressure significantly elevated,  he has not been compliant with his medications Continue amlodipine  and labetalol  per home regimen  Continue IV labetalol  5 mg every 6 hours as needed for SBP > 170, DBP > 100  Mixed hyperlipidemia Continue home atorvastatin .   Multiple prior CVAs Continue Plavix , Lipitor    Pseudohyponatremia Corrected sodium level based on hyperglycemia (810) is 139  Leukocytosis possibly due to leukemoid reaction This is possibly due to stress related to DKA  Noncompliance with medication regimen Patient will be counseled about importance of being compliant with medication regiment when more sober    Advance Care Planning: Full code  Consults: None  Family Communication: Mom at bedside  Severity of Illness: The appropriate patient status for this patient is OBSERVATION. Observation status is judged to be reasonable and necessary  in order to provide the required intensity of service to ensure the patient's safety. The patient's presenting symptoms, physical exam findings, and initial radiographic and laboratory data in the context of their medical condition is felt to place them at decreased risk for further clinical deterioration. Furthermore, it is anticipated that the patient will be medically stable for discharge from the hospital within 2 midnights of admission.   Author: Tomisha Reppucci, DO 09/16/2024 8:59 PM  For on call review [email or web address].      [1]  Allergies Allergen Reactions   Fructose Other (See Comments)    Increase of blood sugar Orange juice    "

## 2024-09-17 ENCOUNTER — Inpatient Hospital Stay (HOSPITAL_COMMUNITY)

## 2024-09-17 ENCOUNTER — Encounter (HOSPITAL_COMMUNITY): Payer: Self-pay | Admitting: Internal Medicine

## 2024-09-17 ENCOUNTER — Observation Stay (HOSPITAL_COMMUNITY)

## 2024-09-17 DIAGNOSIS — E860 Dehydration: Secondary | ICD-10-CM | POA: Diagnosis present

## 2024-09-17 DIAGNOSIS — G9341 Metabolic encephalopathy: Secondary | ICD-10-CM

## 2024-09-17 DIAGNOSIS — N185 Chronic kidney disease, stage 5: Secondary | ICD-10-CM | POA: Diagnosis present

## 2024-09-17 DIAGNOSIS — Z91018 Allergy to other foods: Secondary | ICD-10-CM | POA: Diagnosis not present

## 2024-09-17 DIAGNOSIS — E875 Hyperkalemia: Secondary | ICD-10-CM | POA: Diagnosis present

## 2024-09-17 DIAGNOSIS — F17210 Nicotine dependence, cigarettes, uncomplicated: Secondary | ICD-10-CM | POA: Diagnosis present

## 2024-09-17 DIAGNOSIS — I10 Essential (primary) hypertension: Secondary | ICD-10-CM | POA: Diagnosis not present

## 2024-09-17 DIAGNOSIS — N179 Acute kidney failure, unspecified: Secondary | ICD-10-CM | POA: Diagnosis present

## 2024-09-17 DIAGNOSIS — Z7982 Long term (current) use of aspirin: Secondary | ICD-10-CM | POA: Diagnosis not present

## 2024-09-17 DIAGNOSIS — Z8674 Personal history of sudden cardiac arrest: Secondary | ICD-10-CM | POA: Diagnosis not present

## 2024-09-17 DIAGNOSIS — R739 Hyperglycemia, unspecified: Secondary | ICD-10-CM | POA: Diagnosis present

## 2024-09-17 DIAGNOSIS — Z8249 Family history of ischemic heart disease and other diseases of the circulatory system: Secondary | ICD-10-CM | POA: Diagnosis not present

## 2024-09-17 DIAGNOSIS — Z794 Long term (current) use of insulin: Secondary | ICD-10-CM | POA: Diagnosis not present

## 2024-09-17 DIAGNOSIS — D631 Anemia in chronic kidney disease: Secondary | ICD-10-CM | POA: Diagnosis present

## 2024-09-17 DIAGNOSIS — D72829 Elevated white blood cell count, unspecified: Secondary | ICD-10-CM | POA: Diagnosis present

## 2024-09-17 DIAGNOSIS — Z833 Family history of diabetes mellitus: Secondary | ICD-10-CM | POA: Diagnosis not present

## 2024-09-17 DIAGNOSIS — Z79899 Other long term (current) drug therapy: Secondary | ICD-10-CM | POA: Diagnosis not present

## 2024-09-17 DIAGNOSIS — E782 Mixed hyperlipidemia: Secondary | ICD-10-CM | POA: Diagnosis present

## 2024-09-17 DIAGNOSIS — E108 Type 1 diabetes mellitus with unspecified complications: Secondary | ICD-10-CM | POA: Diagnosis not present

## 2024-09-17 DIAGNOSIS — E1051 Type 1 diabetes mellitus with diabetic peripheral angiopathy without gangrene: Secondary | ICD-10-CM

## 2024-09-17 DIAGNOSIS — E0811 Diabetes mellitus due to underlying condition with ketoacidosis with coma: Secondary | ICD-10-CM | POA: Diagnosis not present

## 2024-09-17 DIAGNOSIS — Z8673 Personal history of transient ischemic attack (TIA), and cerebral infarction without residual deficits: Secondary | ICD-10-CM | POA: Diagnosis not present

## 2024-09-17 DIAGNOSIS — Z9641 Presence of insulin pump (external) (internal): Secondary | ICD-10-CM | POA: Diagnosis present

## 2024-09-17 DIAGNOSIS — Z7902 Long term (current) use of antithrombotics/antiplatelets: Secondary | ICD-10-CM | POA: Diagnosis not present

## 2024-09-17 DIAGNOSIS — Z800 Family history of malignant neoplasm of digestive organs: Secondary | ICD-10-CM | POA: Diagnosis not present

## 2024-09-17 DIAGNOSIS — F32A Depression, unspecified: Secondary | ICD-10-CM | POA: Diagnosis present

## 2024-09-17 DIAGNOSIS — E1022 Type 1 diabetes mellitus with diabetic chronic kidney disease: Secondary | ICD-10-CM | POA: Diagnosis present

## 2024-09-17 DIAGNOSIS — I120 Hypertensive chronic kidney disease with stage 5 chronic kidney disease or end stage renal disease: Secondary | ICD-10-CM | POA: Diagnosis present

## 2024-09-17 DIAGNOSIS — E1010 Type 1 diabetes mellitus with ketoacidosis without coma: Secondary | ICD-10-CM | POA: Diagnosis present

## 2024-09-17 LAB — BASIC METABOLIC PANEL WITH GFR
Anion gap: 16 — ABNORMAL HIGH (ref 5–15)
Anion gap: 16 — ABNORMAL HIGH (ref 5–15)
Anion gap: 24 — ABNORMAL HIGH (ref 5–15)
BUN: 73 mg/dL — ABNORMAL HIGH (ref 6–20)
BUN: 72 mg/dL — ABNORMAL HIGH (ref 6–20)
BUN: 72 mg/dL — ABNORMAL HIGH (ref 6–20)
CO2: 20 mmol/L — ABNORMAL LOW (ref 22–32)
CO2: 21 mmol/L — ABNORMAL LOW (ref 22–32)
CO2: 14 mmol/L — ABNORMAL LOW (ref 22–32)
Calcium: 7.8 mg/dL — ABNORMAL LOW (ref 8.9–10.3)
Calcium: 7.8 mg/dL — ABNORMAL LOW (ref 8.9–10.3)
Calcium: 8 mg/dL — ABNORMAL LOW (ref 8.9–10.3)
Chloride: 96 mmol/L — ABNORMAL LOW (ref 98–111)
Chloride: 98 mmol/L (ref 98–111)
Chloride: 96 mmol/L — ABNORMAL LOW (ref 98–111)
Creatinine, Ser: 6.12 mg/dL — ABNORMAL HIGH (ref 0.61–1.24)
Creatinine, Ser: 6.18 mg/dL — ABNORMAL HIGH (ref 0.61–1.24)
Creatinine, Ser: 6 mg/dL — ABNORMAL HIGH (ref 0.61–1.24)
GFR, Estimated: 11 mL/min — ABNORMAL LOW
GFR, Estimated: 11 mL/min — ABNORMAL LOW
GFR, Estimated: 12 mL/min — ABNORMAL LOW
Glucose, Bld: 216 mg/dL — ABNORMAL HIGH (ref 70–99)
Glucose, Bld: 155 mg/dL — ABNORMAL HIGH (ref 70–99)
Glucose, Bld: 275 mg/dL — ABNORMAL HIGH (ref 70–99)
Potassium: 4 mmol/L (ref 3.5–5.1)
Potassium: 4 mmol/L (ref 3.5–5.1)
Potassium: 4.3 mmol/L (ref 3.5–5.1)
Sodium: 133 mmol/L — ABNORMAL LOW (ref 135–145)
Sodium: 135 mmol/L (ref 135–145)
Sodium: 134 mmol/L — ABNORMAL LOW (ref 135–145)

## 2024-09-17 LAB — BETA-HYDROXYBUTYRIC ACID
Beta-Hydroxybutyric Acid: 3.13 mmol/L — ABNORMAL HIGH (ref 0.05–0.27)
Beta-Hydroxybutyric Acid: 0.15 mmol/L (ref 0.05–0.27)
Beta-Hydroxybutyric Acid: 0.63 mmol/L — ABNORMAL HIGH (ref 0.05–0.27)
Beta-Hydroxybutyric Acid: 2.18 mmol/L — ABNORMAL HIGH (ref 0.05–0.27)

## 2024-09-17 LAB — URINALYSIS, COMPLETE (UACMP) WITH MICROSCOPIC
Bilirubin Urine: NEGATIVE
Glucose, UA: >=500 mg/dL — AB
Hgb urine dipstick: NEGATIVE
Ketones, ur: 5 mg/dL — AB
Leukocytes,Ua: NEGATIVE
Nitrite: NEGATIVE
Protein, ur: >=300 mg/dL — AB
Specific Gravity, Urine: 1.009 (ref 1.005–1.030)
pH: 5 (ref 5.0–8.0)

## 2024-09-17 LAB — URINE DRUG SCREEN
Amphetamines: NEGATIVE
Barbiturates: NEGATIVE
Benzodiazepines: NEGATIVE
Cocaine: NEGATIVE
Fentanyl: NEGATIVE
Methadone Scn, Ur: NEGATIVE
Opiates: NEGATIVE
Tetrahydrocannabinol: POSITIVE — AB

## 2024-09-17 LAB — GLUCOSE, CAPILLARY
Glucose-Capillary: 115 mg/dL — ABNORMAL HIGH (ref 70–99)
Glucose-Capillary: 141 mg/dL — ABNORMAL HIGH (ref 70–99)
Glucose-Capillary: 152 mg/dL — ABNORMAL HIGH (ref 70–99)
Glucose-Capillary: 159 mg/dL — ABNORMAL HIGH (ref 70–99)
Glucose-Capillary: 159 mg/dL — ABNORMAL HIGH (ref 70–99)
Glucose-Capillary: 178 mg/dL — ABNORMAL HIGH (ref 70–99)
Glucose-Capillary: 252 mg/dL — ABNORMAL HIGH (ref 70–99)
Glucose-Capillary: 301 mg/dL — ABNORMAL HIGH (ref 70–99)
Glucose-Capillary: 313 mg/dL — ABNORMAL HIGH (ref 70–99)

## 2024-09-17 LAB — CBG MONITORING, ED
Glucose-Capillary: 133 mg/dL — ABNORMAL HIGH (ref 70–99)
Glucose-Capillary: 162 mg/dL — ABNORMAL HIGH (ref 70–99)
Glucose-Capillary: 267 mg/dL — ABNORMAL HIGH (ref 70–99)
Glucose-Capillary: 317 mg/dL — ABNORMAL HIGH (ref 70–99)
Glucose-Capillary: 397 mg/dL — ABNORMAL HIGH (ref 70–99)

## 2024-09-17 LAB — CBC
HCT: 24.6 % — ABNORMAL LOW (ref 39.0–52.0)
Hemoglobin: 8.5 g/dL — ABNORMAL LOW (ref 13.0–17.0)
MCH: 30 pg (ref 26.0–34.0)
MCHC: 34.6 g/dL (ref 30.0–36.0)
MCV: 86.9 fL (ref 80.0–100.0)
Platelets: 207 K/uL (ref 150–400)
RBC: 2.83 MIL/uL — ABNORMAL LOW (ref 4.22–5.81)
RDW: 15.7 % — ABNORMAL HIGH (ref 11.5–15.5)
WBC: 13.9 K/uL — ABNORMAL HIGH (ref 4.0–10.5)
nRBC: 0 % (ref 0.0–0.2)

## 2024-09-17 LAB — T4, FREE: Free T4: 1.18 ng/dL (ref 0.80–2.00)

## 2024-09-17 LAB — IRON AND TIBC
Iron: 41 ug/dL — ABNORMAL LOW (ref 45–182)
Saturation Ratios: 17 % — ABNORMAL LOW (ref 17.9–39.5)
TIBC: 245 ug/dL — ABNORMAL LOW (ref 250–450)
UIBC: 204 ug/dL

## 2024-09-17 LAB — VITAMIN B12: Vitamin B-12: 759 pg/mL (ref 180–914)

## 2024-09-17 LAB — AMMONIA: Ammonia: 21 umol/L (ref 9–35)

## 2024-09-17 LAB — MRSA NEXT GEN BY PCR, NASAL: MRSA by PCR Next Gen: NOT DETECTED

## 2024-09-17 LAB — MAGNESIUM: Magnesium: 1.6 mg/dL — ABNORMAL LOW (ref 1.7–2.4)

## 2024-09-17 LAB — FERRITIN: Ferritin: 570 ng/mL — ABNORMAL HIGH (ref 24–336)

## 2024-09-17 LAB — HIV ANTIBODY (ROUTINE TESTING W REFLEX): HIV Screen 4th Generation wRfx: NONREACTIVE

## 2024-09-17 LAB — PHOSPHORUS: Phosphorus: 5.5 mg/dL — ABNORMAL HIGH (ref 2.5–4.6)

## 2024-09-17 LAB — TSH: TSH: 1.03 u[IU]/mL (ref 0.350–4.500)

## 2024-09-17 LAB — FOLATE: Folate: 6.8 ng/mL

## 2024-09-17 MED ORDER — ATORVASTATIN CALCIUM 40 MG PO TABS
80.0000 mg | ORAL_TABLET | Freq: Every evening | ORAL | Status: DC
  Administered 2024-09-17 – 2024-09-19 (×3): 80 mg via ORAL
  Filled 2024-09-17 (×3): qty 2

## 2024-09-17 MED ORDER — HEPARIN SODIUM (PORCINE) 5000 UNIT/ML IJ SOLN
5000.0000 [IU] | Freq: Three times a day (TID) | INTRAMUSCULAR | Status: DC
  Administered 2024-09-17 – 2024-09-20 (×10): 5000 [IU] via SUBCUTANEOUS
  Filled 2024-09-17 (×10): qty 1

## 2024-09-17 MED ORDER — LABETALOL HCL 200 MG PO TABS
100.0000 mg | ORAL_TABLET | Freq: Two times a day (BID) | ORAL | Status: DC
  Administered 2024-09-17 (×2): 100 mg via ORAL
  Filled 2024-09-17 (×2): qty 1

## 2024-09-17 MED ORDER — ACETAMINOPHEN 650 MG RE SUPP: 650.0000 mg | Freq: Four times a day (QID) | RECTAL | Status: DC | PRN

## 2024-09-17 MED ORDER — LACTATED RINGERS IV SOLN: INTRAVENOUS | Status: AC

## 2024-09-17 MED ORDER — AMLODIPINE BESYLATE 5 MG PO TABS
10.0000 mg | ORAL_TABLET | Freq: Every day | ORAL | Status: DC
  Administered 2024-09-17 – 2024-09-20 (×4): 10 mg via ORAL
  Filled 2024-09-17 (×4): qty 2

## 2024-09-17 MED ORDER — LABETALOL HCL 5 MG/ML IV SOLN
5.0000 mg | Freq: Four times a day (QID) | INTRAVENOUS | Status: DC | PRN
  Administered 2024-09-17: 5 mg via INTRAVENOUS
  Filled 2024-09-17: qty 4

## 2024-09-17 MED ORDER — ACETAMINOPHEN 325 MG PO TABS: 650.0000 mg | ORAL_TABLET | Freq: Four times a day (QID) | ORAL | Status: DC | PRN

## 2024-09-17 MED ORDER — ONDANSETRON HCL 4 MG PO TABS: 4.0000 mg | ORAL_TABLET | Freq: Four times a day (QID) | ORAL | Status: DC | PRN

## 2024-09-17 MED ORDER — INSULIN ASPART 100 UNIT/ML IJ SOLN
0.0000 [IU] | INTRAMUSCULAR | Status: DC
  Administered 2024-09-17: 1 [IU] via SUBCUTANEOUS
  Administered 2024-09-17: 5 [IU] via SUBCUTANEOUS
  Administered 2024-09-17 (×2): 7 [IU] via SUBCUTANEOUS
  Administered 2024-09-18 (×2): 2 [IU] via SUBCUTANEOUS
  Administered 2024-09-18: 1 [IU] via SUBCUTANEOUS
  Administered 2024-09-18: 2 [IU] via SUBCUTANEOUS
  Administered 2024-09-19: 1 [IU] via SUBCUTANEOUS
  Administered 2024-09-19 (×2): 2 [IU] via SUBCUTANEOUS
  Administered 2024-09-20: 7 [IU] via SUBCUTANEOUS
  Filled 2024-09-17 (×11): qty 1

## 2024-09-17 MED ORDER — INSULIN GLARGINE 100 UNIT/ML ~~LOC~~ SOLN
12.0000 [IU] | Freq: Every day | SUBCUTANEOUS | Status: DC
  Administered 2024-09-17 – 2024-09-19 (×3): 12 [IU] via SUBCUTANEOUS
  Filled 2024-09-17 (×5): qty 0.12

## 2024-09-17 MED ORDER — CLOPIDOGREL BISULFATE 75 MG PO TABS
75.0000 mg | ORAL_TABLET | Freq: Every day | ORAL | Status: DC
  Administered 2024-09-17 – 2024-09-20 (×4): 75 mg via ORAL
  Filled 2024-09-17 (×4): qty 1

## 2024-09-17 MED ORDER — ONDANSETRON HCL 4 MG/2ML IJ SOLN: 4.0000 mg | Freq: Four times a day (QID) | INTRAMUSCULAR | Status: DC | PRN

## 2024-09-17 MED ORDER — CHLORHEXIDINE GLUCONATE CLOTH 2 % EX PADS
6.0000 | MEDICATED_PAD | Freq: Every day | CUTANEOUS | Status: DC
  Administered 2024-09-17 – 2024-09-20 (×4): 6 via TOPICAL

## 2024-09-17 NOTE — Plan of Care (Signed)
" °  Problem: Acute Rehab PT Goals(only PT should resolve) Goal: Pt will Roll Supine to Side Outcome: Progressing Flowsheets (Taken 09/17/2024 1228) Pt will Roll Supine to Side: with supervision Goal: Patient Will Transfer Sit To/From Stand Outcome: Progressing Flowsheets (Taken 09/17/2024 1228) Patient will transfer sit to/from stand: with supervision Goal: Pt Will Transfer Bed To Chair/Chair To Bed Outcome: Progressing Flowsheets (Taken 09/17/2024 1228) Pt will Transfer Bed to Chair/Chair to Bed: with supervision Goal: Pt Will Ambulate Outcome: Progressing Flowsheets (Taken 09/17/2024 1228) Pt will Ambulate:  50 feet  with contact guard assist  with rolling walker   12:29 PM, 09/17/2024 Lynwood Music, MPT Physical Therapist with [facility] 336 [phone] office [phone] mobile phone  "

## 2024-09-17 NOTE — Progress Notes (Signed)
 "          PROGRESS NOTE  Nathaniel [name] FMW:[id number] DOB: [birth year] DOA: 09/16/2024 PCP: Carlette [name], MD  Brief History:  38 year old male with a history of diabetes mellitus type 1 on Omnipod insulin ,  history of cardiac arrest in the past due to DKA/electrolyte derangement, necrotizing fasciitis Jan 2024, CKD 5,  hyperlipidemia, hypertension, CVA presenting with somnolence and elevated CBGs. Patient diagnosed with Type 1 diabetes at 38 years of age  Patient is reportedly on an OmniPod with Dexcom monitoring.  However, review of his last endocrine notes shows that the patient seems to have poor insight regarding management of his Dexcom and Omnipod. Review of the [facility] record shows long history of poor compliance.  It was reported that the patient has had his Omnipod on, but he has not been taking any of his other medications. Notably, the patient was recently admitted to the hospital from 08/10/2024 to 08/11/2024 for DKA.  He was discharged home to restart his Omnipod insulin .  In the ED, the patient was afebrile and hemodynamically stable with oxygen saturation 96-100% room air. Sodium 128, potassium 4.5, bicarbonate <7 with anion gap that was unable to be calculated.  WBC 20.3, hemoglobin 11.1, platelet 267. The patient was started on insulin  drip and IV fluids. The patient's DKA improved, and he was transitioned to subcutaneous insulin . However, the patient remained somnolent.  There was concern the patient's progression of CKD may be contributing.  Nephrology was consulted to assist with management.   Assessment/Plan: DKA Type 1 --patient started on IV insulin  with q 1 hour CBG check and q 4 hour BMPs -pt started on aggressive fluid resuscitation -Electrolytes were monitored and repleted -transitioned to [place] insulin  once anion gap closed -diet was advanced once anion gap closed -09/10/24 HbA1C--8.3  Acute metabolic encephalopathy - 09/17/2024--patient remains  somewhat somnolent - Concern progression of his CKD contributing to encephalopathy - Nephrology consulted - B12 - Folate - TSH - Ammonia - UDS - UA - CT brain  CKD stage V - Baseline creatinine 5.9-6.1 - Nephrology consulted  Uncontrolled diabetes mellitus type 1 with hyperglycemia - 09/17/2024--start Semglee  12 units - Start lispro every 4 hours sliding scale - Start lispro 4 units with meals  Essential hypertension - Restart labetalol  and amlodipine   Mixed hyperlipidemia - Continue statin  History of stroke - Continue Plavix   Hyperkalemia - Improved with insulin  and fluids    Family Communication:   no Family at bedside  Consultants:  renal  Code Status:  FULL   DVT Prophylaxis:  [place] Heparin    Procedures: As Listed in Progress Note Above  Antibiotics: None      Subjective: Patient denies fevers, chills, headache, chest pain, dyspnea, nausea, vomiting, diarrhea, abdominal pain, dysuria   Objective: Vitals:   09/17/24 0340 09/17/24 0440 09/17/24 0600 09/17/24 0756  BP:  (!) 170/97 139/70   Pulse: 100 (!) 102 96   Resp: 18 19 20    Temp:  98.3 F (36.8 C)  98.3 F (36.8 C)  TempSrc:  Oral  Oral  SpO2: 96% 100% 100%   Weight:      Height:        Intake/Output Summary (Last 24 hours) at 09/17/2024 0835 Last data filed at 09/17/2024 0600 Gross per 24 hour  Intake 2431.51 ml  Output --  Net 2431.51 ml   Weight change:  Exam:  General:  Pt is alert, follows commands appropriately, not in acute distress; falls back asleep without stimulation  HEENT: No icterus, No thrush, No neck mass, [place]/AT Cardiovascular: RRR, S1/S2, no rubs, no gallops Respiratory: Bibasilar crackles.  No wheezing.  Good air movement Abdomen: Soft/+BS, non tender, non distended, no guarding Extremities: No edema, No lymphangitis, No petechiae, No rashes, no synovitis   Data Reviewed: I have personally reviewed following labs and imaging studies Basic Metabolic  Panel: Recent Labs  Lab 09/16/24 1847 09/16/24 2302 09/17/24 0238 09/17/24 0721  NA 128* 132* 133* 135  K 5.5* 4.2 4.0 4.0  CL 84* 91* 96* 98  CO2 <7* 14* 20* 21*  GLUCOSE 810* 525* 216* 155*  BUN 77* 76* 73* 72*  CREATININE 5.82* 5.94* 6.12* 6.18*  CALCIUM  8.2* 7.9* 7.8* 7.8*  MG  --   --  1.6*  --   PHOS  --   --  5.5*  --    Liver Function Tests: No results for input(s): AST, ALT, ALKPHOS, BILITOT, PROT, ALBUMIN in the last 168 hours. No results for input(s): LIPASE, AMYLASE in the last 168 hours. No results for input(s): AMMONIA in the last 168 hours. Coagulation Profile: No results for input(s): INR, PROTIME in the last 168 hours. CBC: Recent Labs  Lab 09/16/24 1847 09/17/24 0238  WBC 20.3* 13.9*  HGB 11.1* 8.5*  HCT 34.6* 24.6*  MCV 94.0 86.9  PLT 267 207   Cardiac Enzymes: No results for input(s): CKTOTAL, CKMB, CKMBINDEX, TROPONINI in the last 168 hours. BNP: Invalid input(s): POCBNP CBG: Recent Labs  Lab 09/17/24 0001 09/17/24 0104 09/17/24 0200 09/17/24 0311 09/17/24 0408  GLUCAP 397* 317* 267* 162* 133*   HbA1C: No results for input(s): HGBA1C in the last 72 hours. Urine analysis:    Component Value Date/Time   COLORURINE COLORLESS (A) 08/10/2024 1408   APPEARANCEUR CLEAR 08/10/2024 1408   LABSPEC 1.007 08/10/2024 1408   PHURINE 5.0 08/10/2024 1408   GLUCOSEU >=500 (A) 08/10/2024 1408   HGBUR SMALL (A) 08/10/2024 1408   BILIRUBINUR NEGATIVE 08/10/2024 1408   KETONESUR 20 (A) 08/10/2024 1408   PROTEINUR 100 (A) 08/10/2024 1408   UROBILINOGEN 0.2 06/07/2015 1815   NITRITE NEGATIVE 08/10/2024 1408   LEUKOCYTESUR NEGATIVE 08/10/2024 1408   Sepsis Labs: @LABRCNTIP (procalcitonin:4,lacticidven:4) )No results found for this or any previous visit (from the past 240 hours).   Scheduled Meds:  amLODipine   10 mg Oral Daily   atorvastatin   80 mg Oral QPM   Chlorhexidine  Gluconate Cloth  6 each Topical Daily    clopidogrel   75 mg Oral Daily   heparin   5,000 Units Subcutaneous Q8H   insulin  aspart  0-9 Units Subcutaneous Q4H   insulin  glargine  12 Units Subcutaneous Daily   labetalol   100 mg Oral BID   Continuous Infusions:  lactated ringers       Procedures/Studies: No results found.  Alm Schneider, DO  [facility]  If 7PM-7AM, please contact night-coverage [email or web address] Password [email or web address] 09/17/2024, 8:35 AM   LOS: 0 days   "

## 2024-09-17 NOTE — ED Notes (Signed)
 Patient moved towards the end of the bed utilizing both handrails in the upright position. Patient slid off onto floor without using the call light in need of help. Patient was assisted off of the floor onto the bed the patient then proceeded to fall to his knees. Patient was assisted into to bed. Bed alarm was then placed under patient with bedrails remaining in the upright position. Provider was informed of the fall. Patient advised he was not in any pain. No obvious injuries to report.

## 2024-09-17 NOTE — Plan of Care (Signed)
" °  Problem: Acute Rehab OT Goals (only OT should resolve) Goal: Pt. Will Perform Grooming Flowsheets (Taken 09/17/2024 1144) Pt Will Perform Grooming: with modified independence Goal: Pt. Will Perform Lower Body Bathing Flowsheets (Taken 09/17/2024 1144) Pt Will Perform Lower Body Bathing: with modified independence Goal: Pt. Will Perform Lower Body Dressing Flowsheets (Taken 09/17/2024 1144) Pt Will Perform Lower Body Dressing: with modified independence Goal: Pt. Will Transfer To Toilet Flowsheets (Taken 09/17/2024 1144) Pt Will Transfer to Toilet:  with modified independence  ambulating Goal: Pt. Will Perform Toileting-Clothing Manipulation Flowsheets (Taken 09/17/2024 1144) Pt Will Perform Toileting - Clothing Manipulation and hygiene: with modified independence Goal: Pt/Caregiver Will Perform Home Exercise Program Flowsheets (Taken 09/17/2024 1144) Pt/caregiver will Perform Home Exercise Program:  Increased strength  Independently  Both right and left upper extremity  Sansa Alkema OT, MOT  "

## 2024-09-17 NOTE — Hospital Course (Addendum)
 38 year old male with a history of diabetes mellitus type 1 on Omnipod insulin ,  history of cardiac arrest in the past due to DKA/electrolyte derangement, necrotizing fasciitis Jan 2024, CKD 5,  hyperlipidemia, hypertension, CVA presenting with somnolence and elevated CBGs. Patient diagnosed with Type 1 diabetes at 38 years of age  Patient is reportedly on an OmniPod with Dexcom monitoring.  However, review of his last endocrine notes shows that the patient seems to have poor insight regarding management of his Dexcom and Omnipod. Review of the [facility] record shows long history of poor compliance.  It was reported that the patient has had his Omnipod on, but he has not been taking any of his other medications. Notably, the patient was recently admitted to the hospital from 08/10/2024 to 08/11/2024 for DKA.  He was discharged home to restart his Omnipod insulin .  In the ED, the patient was afebrile and hemodynamically stable with oxygen saturation 96-100% room air. Sodium 128, potassium 4.5, bicarbonate <7 with anion gap that was unable to be calculated.  WBC 20.3, hemoglobin 11.1, platelet 267. The patient was started on insulin  drip and IV fluids. The patient's DKA improved, and he was transitioned to subcutaneous insulin . However, the patient remained somnolent.  There was concern the patient's progression of CKD may be contributing.  Nephrology was consulted to assist with management. The patient's DKA resolved.  He was transitioned to subcutaneous insulin .  Unfortunately, his mental status was slow to improve.  There was concern for uremia.  Fortunately, on 09/18/2024, the patient's mental status improved back to baseline.

## 2024-09-17 NOTE — TOC CM/SW Note (Signed)
 Transition of Care [facility]) - Inpatient Brief Assessment   Patient Details  Name: Nathaniel [name] MRN: [id number] Date of Birth: [birth year]  Transition of Care [facility]) CM/SW Contact:    Lucie Lunger, LCSWA Phone Number: 09/17/2024, 9:21 AM   Clinical Narrative: Transition of Care Department [facility]) has reviewed patient and no TOC needs have been identified at this time. We will continue to monitor patient advancement through interdiciplinary progression rounds. If new patient transition needs arise, please place a TOC consult.  Transition of Care Asessment: Insurance and Status: Insurance coverage has been reviewed Patient has primary care physician: Yes Home environment has been reviewed: From home Prior level of function:: Independent Prior/Current Home Services: No current home services Social Drivers of Health Review: SDOH reviewed no interventions necessary Readmission risk has been reviewed: Yes Transition of care needs: no transition of care needs at this time

## 2024-09-17 NOTE — Plan of Care (Signed)

## 2024-09-17 NOTE — Evaluation (Signed)
 Occupational Therapy Evaluation Patient Details Name: Nathaniel [name] MRN: [id number] DOB: 07/06/1986 Today's Date: 09/17/2024   History of Present Illness   Nathaniel [name] is a 38 y.o. male with medical history significant of hypertension, hyperlipidemia, CVA, CKD 3B, type 1 diabetes mellitus, on insulin  pump at home, history of cardiac arrest in the past due to DKA/electrolyte derangement, necrotizing fasciitis last year, history of paresis who presents emergency department due to high levels of glucose in blood.  At bedside, patient was somnolent, was arousable, woke her glucose back to sleep.[name]  History was obtained from EDP and mother at bedside.  Per report, patient's blood glucose was reading high .  It was reported that patient has been taking his insulin  without taking other medications.  He presented with several episodes of nonbloody vomiting this morning, but no report of fever, chills, chest pain, shortness of breath.     He was recently admitted from 11/21 to 11/22 due to diabetes mellitus type 1, poorly controlled with severe DKA (per DO)     Clinical Impressions Pt agreeable to OT and PT co-evaluation. Pt No fully oriented today with flat affect. Pt was inconsistent with following commands for attempts at ADL's. B UE generally weak with pt likely needing mod A for lower body ADL's for safety and due to cognitive deficits. Pt required min to mod A for EOB to chair transfer without AD and ambulation with AD. Pt left in the chair with call bell within reach and chair alarm set. Pt reports his mother is home 24/7. Pt will benefit from continued OT in the hospital to increase strength, balance, and endurance for safe ADL's.        If plan is discharge home, recommend the following:   A lot of help with walking and/or transfers;A lot of help with bathing/dressing/bathroom;Assistance with cooking/housework;Assist for transportation;Help with stairs or ramp for entrance;Direct  supervision/assist for medications management     Functional Status Assessment   Patient has had a recent decline in their functional status and demonstrates the ability to make significant improvements in function in a reasonable and predictable amount of time.     Equipment Recommendations   None recommended by OT             Precautions/Restrictions   Precautions Precautions: Fall Recall of Precautions/Restrictions: Impaired Restrictions Weight Bearing Restrictions Per Provider Order: No     Mobility Bed Mobility Overal bed mobility: Needs Assistance Bed Mobility: Supine to Sit     Supine to sit: Min assist     General bed mobility comments: Assist to pull to sit at EOB.    Transfers Overall transfer level: Needs assistance   Transfers: Sit to/from Stand, Bed to chair/wheelchair/BSC Sit to Stand: Min assist     Step pivot transfers: Min assist, Mod assist     General transfer comment: EOB to chair; unsteady labored movement.      Balance Overall balance assessment: Needs assistance Sitting-balance support: No upper extremity supported, Feet supported Sitting balance-Leahy Scale: Good Sitting balance - Comments: seated at EOB   Standing balance support: No upper extremity supported, During functional activity Standing balance-Leahy Scale: Poor Standing balance comment: Without AD; poor to fair with RW                           ADL either performed or assessed with clinical judgement   ADL Overall ADL's : Needs assistance/impaired     Grooming:  Set up;Sitting   Upper Body Bathing: Set up;Sitting   Lower Body Bathing: Moderate assistance;Sitting/lateral leans   Upper Body Dressing : Set up;Sitting   Lower Body Dressing: Moderate assistance;Sitting/lateral leans Lower Body Dressing Details (indicate cue type and reason): Pt unable to doff socks, but could reach B LE. Pt seemed confused on directions. Toilet Transfer: Minimal  assistance;Moderate assistance;Ambulation;Stand-pivot;Rolling walker (2 wheels) Toilet Transfer Details (indicate cue type and reason): EOB to chair without AD ; ambulation in room/hall with RW. Toileting- Clothing Manipulation and Hygiene: Moderate assistance;Sit to/from stand Toileting - Clothing Manipulation Details (indicate cue type and reason): Pt attempted peri-care while standing, but needed assist to clean well.     Functional mobility during ADLs: Minimal assistance;Moderate assistance;Rolling walker (2 wheels)       Vision Baseline Vision/History: 0 No visual deficits Ability to See in Adequate Light: 0 Adequate Patient Visual Report: No change from baseline Vision Assessment?: No apparent visual deficits     Perception Perception: Not tested       Praxis Praxis: Not tested       Pertinent Vitals/Pain Pain Assessment Pain Assessment: No/denies pain     Extremity/Trunk Assessment Upper Extremity Assessment Upper Extremity Assessment: Generalized weakness   Lower Extremity Assessment Lower Extremity Assessment: Defer to PT evaluation   Cervical / Trunk Assessment Cervical / Trunk Assessment: Normal   Communication Communication Communication: No apparent difficulties   Cognition Arousal: Alert Behavior During Therapy: Flat affect Cognition: No family/caregiver present to determine baseline, Cognition impaired   Orientation impairments: Time         OT - Cognition Comments: Pt oriented to self and place, but was incorrect on year and month.                 Following commands: Impaired Following commands impaired: Follows one step commands inconsistently     Cueing  General Comments   Cueing Techniques: Verbal cues;Tactile cues                 Home Living Family/patient expects to be discharged to:: Private residence Living Arrangements: Parent Available Help at Discharge: Family;Available 24 hours/day Type of Home: House Home  Access: Stairs to enter [employer] of Steps: 4 Entrance Stairs-Rails: Right;Left;Can reach both Home Layout: One level     Bathroom Shower/Tub: Chief Strategy Officer: Standard Bathroom Accessibility: Yes   Home Equipment: Agricultural Consultant (2 wheels);Cane - single point;Wheelchair - manual;BSC/3in1   Additional Comments: Per chart review and some pt report.      Prior Functioning/Environment Prior Level of Function : Independent/Modified Independent;Patient poor historian/Family not available             Mobility Comments: Community ambulator without AD. ADLs Comments: Independent. Drives some.    OT Problem List: Decreased strength;Decreased activity tolerance;Impaired balance (sitting and/or standing);Decreased cognition;Decreased safety awareness   OT Treatment/Interventions: Self-care/ADL training;Therapeutic exercise;Therapeutic activities;Patient/family education;Balance training;DME and/or AE instruction      OT Goals(Current goals can be found in the care plan section)   Acute Rehab OT Goals Patient Stated Goal: Return home OT Goal Formulation: With patient Time For Goal Achievement: 10/01/24 Potential to Achieve Goals: Good   OT Frequency:  Min 2X/week    Co-evaluation PT/OT/SLP Co-Evaluation/Treatment: Yes Reason for Co-Treatment: To address functional/ADL transfers   OT goals addressed during session: ADL's and self-care                       End of Session  Equipment Utilized During Treatment: Rolling walker (2 wheels)  Activity Tolerance: Patient tolerated treatment well Patient left: in chair;with call bell/phone within reach;with chair alarm set  OT Visit Diagnosis: Unsteadiness on feet (R26.81);Other abnormalities of gait and mobility (R26.89);Muscle weakness (generalized) (M62.81);History of falling (Z91.81);Other symptoms and signs involving cognitive function                Time: 9159-9093 OT Time Calculation  (min): 26 min Charges:  OT General Charges $OT Visit: 1 Visit OT Evaluation $OT Eval Low Complexity: 1 Low  Revella Shelton OT, MOT  Jayson Person 09/17/2024, 11:41 AM

## 2024-09-17 NOTE — Evaluation (Signed)
 Physical Therapy Evaluation Patient Details Name: Nathaniel [name] MRN: [id number] DOB: [birth year] Today's Date: 09/17/2024  History of Present Illness  Nathaniel [name] is a 38 y.o. male with medical history significant of hypertension, hyperlipidemia, CVA, CKD 3B, type 1 diabetes mellitus, on insulin  pump at home, history of cardiac arrest in the past due to DKA/electrolyte derangement, necrotizing fasciitis last year, history of paresis who presents emergency department due to high levels of glucose in blood.  At bedside, patient was somnolent, was arousable, woke her glucose back to sleep.[name]  History was obtained from EDP and mother at bedside.  Per report, patient's blood glucose was reading high .  It was reported that patient has been taking his insulin  without taking other medications.  He presented with several episodes of nonbloody vomiting this morning, but no report of fever, chills, chest pain, shortness of breath.     He was recently admitted from 11/21 to 11/22 due to diabetes mellitus type 1, poorly controlled with severe DKA   Clinical Impression  Patient unsteady on feet and had to lean on armrest of chair during transfer without AD, safer using RW and tolerated ambulating out of room with slow labored movement, most difficulty making turns and limited mostly due to fatigue. Patient tolerated sitting up in chair after therapy - RN aware. Patient will benefit from continued skilled physical therapy in hospital and recommended venue below to increase strength, balance, endurance for safe ADLs and gait.          If plan is discharge home, recommend the following: A little help with bathing/dressing/bathroom;Help with stairs or ramp for entrance;Assistance with cooking/housework;A lot of help with bathing/dressing/bathroom   Can travel by private vehicle        Equipment Recommendations None recommended by PT  Recommendations for Other Services       Functional Status  Assessment Patient has had a recent decline in their functional status and demonstrates the ability to make significant improvements in function in a reasonable and predictable amount of time.     Precautions / Restrictions Precautions Precautions: Fall Recall of Precautions/Restrictions: Impaired Restrictions Weight Bearing Restrictions Per Provider Order: No      Mobility  Bed Mobility Overal bed mobility: Needs Assistance Bed Mobility: Supine to Sit     Supine to sit: Min assist     General bed mobility comments: labored movement, increased time    Transfers Overall transfer level: Needs assistance   Transfers: Sit to/from Stand, Bed to chair/wheelchair/BSC Sit to Stand: Min assist   Step pivot transfers: Min assist, Mod assist       General transfer comment: had to lean on armrest of chair for support during transfer, safer using RW    Ambulation/Gait Ambulation/Gait assistance: Min assist Gait Distance (Feet): 40 Feet Assistive device: Rolling walker (2 wheels) Gait Pattern/deviations: Decreased step length - right, Decreased step length - left, Decreased stride length, Trunk flexed Gait velocity: slow     General Gait Details: slow labored unsteady movement using RW, most diffiuclty completing turns, limited mostly due to fatigue  Stairs            Wheelchair Mobility     Tilt Bed    Modified Rankin (Stroke Patients Only)       Balance Overall balance assessment: Needs assistance Sitting-balance support: Feet supported, No upper extremity supported Sitting balance-Leahy Scale: Good Sitting balance - Comments: seated at EOB   Standing balance support: During functional activity, No upper extremity supported  Standing balance-Leahy Scale: Poor Standing balance comment: fair/poor using RW                             Pertinent Vitals/Pain Pain Assessment Pain Assessment: No/denies pain    Home Living Family/patient expects to  be discharged to:: Private residence Living Arrangements: Parent Available Help at Discharge: Family;Available 24 hours/day Type of Home: House Home Access: Stairs to enter Entrance Stairs-Rails: Right;Left;Can reach both Entrance Stairs-Number of Steps: 4   Home Layout: One level Home Equipment: Agricultural Consultant (2 wheels);Cane - single point;Wheelchair - manual;BSC/3in1 Additional Comments: Per chart review and some pt report.    Prior Function Prior Level of Function : Independent/Modified Independent;Patient poor historian/Family not available             Mobility Comments: Community ambulator without AD. ADLs Comments: Independent. Drives some.     Extremity/Trunk Assessment   Upper Extremity Assessment Upper Extremity Assessment: Defer to OT evaluation    Lower Extremity Assessment Lower Extremity Assessment: Generalized weakness    Cervical / Trunk Assessment Cervical / Trunk Assessment: Normal  Communication   Communication Communication: No apparent difficulties    Cognition Arousal: Alert Behavior During Therapy: Flat affect                             Following commands: Impaired Following commands impaired: Follows one step commands inconsistently     Cueing Cueing Techniques: Verbal cues, Tactile cues     General Comments      Exercises     Assessment/Plan    PT Assessment Patient needs continued PT services  PT Problem List Decreased strength;Decreased range of motion;Decreased balance;Decreased mobility       PT Treatment Interventions DME instruction;Gait training;Stair training;Functional mobility training;Therapeutic activities;Therapeutic exercise;Balance training;Patient/family education    PT Goals (Current goals can be found in the Care Plan section)  Acute Rehab PT Goals Patient Stated Goal: return home with family to assist PT Goal Formulation: With patient Time For Goal Achievement: 09/21/24 Potential to Achieve  Goals: Good    Frequency Min 3X/week     Co-evaluation PT/OT/SLP Co-Evaluation/Treatment: Yes Reason for Co-Treatment: To address functional/ADL transfers PT goals addressed during session: Mobility/safety with mobility;Balance;Proper use of DME OT goals addressed during session: ADL's and self-care       AM-PAC PT 6 Clicks Mobility  Outcome Measure Help needed turning from your back to your side while in a flat bed without using bedrails?: A Little Help needed moving from lying on your back to sitting on the side of a flat bed without using bedrails?: A Little Help needed moving to and from a bed to a chair (including a wheelchair)?: A Little Help needed standing up from a chair using your arms (e.g., wheelchair or bedside chair)?: A Little Help needed to walk in hospital room?: A Lot Help needed climbing 3-5 steps with a railing? : A Lot 6 Click Score: 16    End of Session   Activity Tolerance: Patient tolerated treatment well;Patient limited by fatigue Patient left: in chair;with call bell/phone within reach Nurse Communication: Mobility status PT Visit Diagnosis: Unsteadiness on feet (R26.81);Other abnormalities of gait and mobility (R26.89);Muscle weakness (generalized) (M62.81)    Time: 9165-9094 PT Time Calculation (min) (ACUTE ONLY): 31 min   Charges:   PT Evaluation $PT Eval Moderate Complexity: 1 Mod PT Treatments $Therapeutic Activity: 23-37 mins PT General Charges $$  ACUTE PT VISIT: 1 Visit         12:25 PM, 09/17/2024 Lynwood Music, MPT Physical Therapist with [facility] 336 [phone] office [phone] mobile phone

## 2024-09-17 NOTE — Consult Note (Signed)
 Nephrology Consult    Assessment/Recommendations:   AKI on CKD4 -followed by us  at our [facility] office, typically seen by me. Baseline eGFR typically around 15-18 recently. Did have some improvement in renal function on 12/1. AKI likely in the context of DKA -continue with supportive care: LR @ 75cc/hr. If no turn around despite supportive care, then will need to move forward with dialysis initiation in-house within the next 48 hrs which I discussed in detail with patient's mom over the phone. She is very much in agreement -will check urine and ultrasound -Avoid nephrotoxic medications including NSAIDs and iodinated intravenous contrast exposure unless the latter is absolutely indicated.  Preferred narcotic agents for pain control are hydromorphone, fentanyl , and methadone. Morphine  should not be used. Avoid Baclofen and avoid oral sodium phosphate  and magnesium  citrate based laxatives / bowel preps. Continue strict Input and Output monitoring. Will monitor the patient closely with you and intervene or adjust therapy as indicated by changes in clinical status/labs   Acute metabolic encephalopathy -concern for uremia +/- DKA. If no turn around with renal function/mentation, then will need to go ahead and start dialysis in-house  DKA -mgmt per primary service. S/p insulin  gtt, now on subcutaneous. Receiving fluids  AGMA -secondary to DKA and AKI -DKA mgmt as above  HTN -BP reasonably controlled  Anemia of CKD -received two doses of venofer  200mg  recently -rechecking Fe panel -transfuse prn for Hgb <7. Hgb 11.1 (likely hemoconcentrated)->8.5 today  Recommendations conveyed to primary service. Discussed with patient's mom over the phone.   Ephriam Stank [facility] 09/17/2024 9:15 AM   _____________________________________________________________________________________   History of Present Illness: Nathaniel [name] is a/an 38 y.o. male with a past medical  history of DM1 with history of DKA, history of cardiac arrest in the past due to DKA/electrolyte derangements, history of necrotizing fasciitis, CKD 4, hypertension, history of stroke, hyperlipidemia who presents to [facility] with somnolence.  Found to be in DKA upon presentation, had a recent admission for this back in November.  He was started on insulin  drip and IV fluids and subsequently transition to subcutaneous insulin .  However, had been somnolent and there is a concern for uremia given his AKI on presentation. Patient seen in room. He reports feeling better. He is not sure what year it is. When prompted, he denies any chest pain, SOB, swelling, dysgeusia, N/V, loss of appetite.   Medications:  Current Facility-Administered Medications  Medication Dose Route Frequency Provider Last Rate Last Admin   acetaminophen  (TYLENOL ) tablet 650 mg  650 mg Oral Q6H PRN Adefeso, Oladapo, DO       Or   acetaminophen  (TYLENOL ) suppository 650 mg  650 mg Rectal Q6H PRN Adefeso, Oladapo, DO       amLODipine  (NORVASC ) tablet 10 mg  10 mg Oral Daily Adefeso, Oladapo, DO       atorvastatin  (LIPITOR ) tablet 80 mg  80 mg Oral QPM Adefeso, Oladapo, DO       Chlorhexidine  Gluconate Cloth 2 % PADS 6 each  6 each Topical Daily Adefeso, Oladapo, DO       clopidogrel  (PLAVIX ) tablet 75 mg  75 mg Oral Daily Adefeso, Oladapo, DO       dextrose  50 % solution 0-50 mL  0-50 mL Intravenous PRN Patsey Lot, MD       heparin  injection 5,000 Units  5,000 Units Subcutaneous Q8H Adefeso, Oladapo, DO   5,000 Units at 09/17/24 9372   insulin  aspart (novoLOG ) injection 0-9 Units  0-9 Units  Subcutaneous Q4H Tat, Alm, MD       insulin  glargine (LANTUS ) injection 12 Units  12 Units Subcutaneous Daily Tat, David, MD       labetalol  (NORMODYNE ) injection 5 mg  5 mg Intravenous Q6H PRN Adefeso, Oladapo, DO   5 mg at 09/17/24 0551   labetalol  (NORMODYNE ) tablet 100 mg  100 mg Oral BID Adefeso, Oladapo, DO       lactated ringers   infusion   Intravenous Continuous Tat, Alm, MD 75 mL/hr at 09/17/24 0844 New Bag at 09/17/24 0844   ondansetron  (ZOFRAN ) tablet 4 mg  4 mg Oral Q6H PRN Adefeso, Oladapo, DO       Or   ondansetron  (ZOFRAN ) injection 4 mg  4 mg Intravenous Q6H PRN Adefeso, Oladapo, DO         ALLERGIES Fructose  MEDICAL HISTORY Past Medical History:  Diagnosis Date   Cardiac arrest (HCC)    Diabetes mellitus Type 1    lantus /novolog    Hyperlipidemia    Hypertension    Marijuana abuse    occaisionally   Noncompliance with medication regimen    Stage 4 chronic kidney disease (HCC)    Stroke (HCC) 2020   Tobacco abuse    5/day     SOCIAL HISTORY Social History   Socioeconomic History   Marital status: Single    Spouse name: Not on file   Number of children: Not on file   Years of education: Not on file   Highest education level: Not on file  Occupational History   Occupation: disabled  Tobacco Use   Smoking status: Every Day    Current packs/day: 0.50    Average packs/day: 0.5 packs/day for 8.0 years (4.0 ttl pk-yrs)    Types: Cigarettes   Smokeless tobacco: Never   Tobacco comments:    smoke 5-8 per day  Vaping Use   Vaping status: Never Used  Substance and Sexual Activity   Alcohol use: Never   Drug use: Yes    Types: Marijuana    Comment: last use yesterday, every other days    Sexual activity: Yes    Birth control/protection: Condom  Other Topics Concern   Not on file  Social History Narrative   Not on file   Social Drivers of Health   Tobacco Use: High Risk (09/10/2024)   Patient History    Smoking Tobacco Use: Every Day    Smokeless Tobacco Use: Never    Passive Exposure: Not on file  Financial Resource Strain: Low Risk (01/17/2023)   Overall Financial Resource Strain (CARDIA)    Difficulty of Paying Living Expenses: Not hard at all  Food Insecurity: No Food Insecurity (09/17/2024)   Epic    Worried About Radiation Protection Practitioner of Food in the Last Year: Never true    [employer] of Food in the Last Year: Never true  Transportation Needs: No Transportation Needs (09/17/2024)   Epic    Lack of Transportation (Medical): No    Lack of Transportation (Non-Medical): No  Physical Activity: Not on file  Stress: Not on file  Social Connections: Not on file  Intimate Partner Violence: Not At Risk (09/17/2024)   Epic    Fear of Current or Ex-Partner: No    Emotionally Abused: No    Physically Abused: No    Sexually Abused: No  Depression (PHQ2-9): Low Risk (09/11/2024)   Depression (PHQ2-9)    PHQ-2 Score: 0  Alcohol Screen: Not on file  Housing: Low Risk (09/17/2024)  Epic    Unable to Pay for Housing in the Last Year: No    Number of Times Moved in the Last Year: 0    Homeless in the Last Year: No  Utilities: Not At Risk (09/17/2024)   Epic    Threatened with loss of utilities: No  Health Literacy: Not on file     FAMILY HISTORY Family History  Problem Relation Age of Onset   Diabetes Father    Kidney failure Father        last 4 mnths of life   Hypertension Mother    Diabetes Maternal Grandmother    Pancreatic cancer Maternal Grandfather    Diabetes Paternal Grandfather      Review of Systems: 12 systems reviewed Otherwise as per HPI, all other systems reviewed and negative  Physical Exam: Vitals:   09/17/24 0756 09/17/24 0800  BP:  (!) 146/85  Pulse:  (!) 103  Resp:  20  Temp: 98.3 F (36.8 C)   SpO2:  100%   No intake/output data recorded.  Intake/Output Summary (Last 24 hours) at 09/17/2024 0915 Last data filed at 09/17/2024 0600 Gross per 24 hour  Intake 2431.51 ml  Output --  Net 2431.51 ml   General: well-appearing, no acute distress, sitting in chair eating breakfast HEENT: anicteric sclera, oropharynx clear without lesions CV: regular rate, normal rhythm, no murmurs, no gallops, no rubs, no peripheral edema Lungs: clear to auscultation bilaterally, normal work of breathing Abd: soft, non-tender, non-distended Skin: no  visible lesions or rashes Psych: alert, engaged, appropriate mood and affect Musculoskeletal: no obvious deformities Neuro: AAOX2 (self, location), speech clear and coherent, no asterixis/myoclonic jerks observed Dialysis access: clotted AVG  Test Results Reviewed Lab Results  Component Value Date   NA 135 09/17/2024   K 4.0 09/17/2024   CL 98 09/17/2024   CO2 21 (L) 09/17/2024   BUN 72 (H) 09/17/2024   CREATININE 6.18 (H) 09/17/2024   GFR 32.98 (L) 03/19/2020   CALCIUM  7.8 (L) 09/17/2024   ALBUMIN 4.5 08/10/2024   PHOS 5.5 (H) 09/17/2024     I have reviewed all relevant outside healthcare records related to the patient's kidney injury.

## 2024-09-17 NOTE — Inpatient Diabetes Management (Addendum)
 Inpatient Diabetes Program Recommendations  AACE/ADA: New Consensus Statement on Inpatient Glycemic Control   Target Ranges:  Prepandial:   less than 140 mg/dL      Peak postprandial:   less than 180 mg/dL (1-2 hours)      Critically ill patients:  140 - 180 mg/dL    Latest Reference Range & Units 09/16/24 21:56 09/16/24 22:26 09/16/24 22:56 09/16/24 23:27 09/17/24 00:01 09/17/24 01:04 09/17/24 02:00 09/17/24 03:11 09/17/24 04:08  Glucose-Capillary 70 - 99 mg/dL 467 (HH) 518 (H) 510 (H) 445 (H) 397 (H) 317 (H) 267 (H) 162 (H) 133 (H)    Latest Reference Range & Units 09/16/24 18:47 09/16/24 23:02 09/17/24 02:38 09/17/24 07:21  CO2 22 - 32 mmol/L <7 (L) 14 (L) 20 (L) 21 (L)  Glucose 70 - 99 mg/dL 189 (HH) 474 (HH) 783 (H) 155 (H)  BUN 6 - 20 mg/dL 77 (H) 76 (H) 73 (H) 72 (H)  Creatinine 0.61 - 1.24 mg/dL 4.17 (H) 4.05 (H) 3.87 (H) 6.18 (H)  Calcium  8.9 - 10.3 mg/dL 8.2 (L) 7.9 (L) 7.8 (L) 7.8 (L)  Anion gap 5 - 15  NOT CALCULATED 27 (H) 16 (H) 16 (H)    Latest Reference Range & Units 09/16/24 18:47 09/16/24 23:02 09/17/24 02:38  Beta-Hydroxybutyric Acid 0.05 - 0.27 mmol/L 7.62 (H) 3.13 (H) 0.15    Latest Reference Range & Units 08/10/24 14:37  CO2 22 - 32 mmol/L <7 (L)  Glucose 70 - 99 mg/dL 050 (HH)  BUN 6 - 20 mg/dL 80 (H)  Creatinine 9.38 - 1.24 mg/dL 2.90 (H)  Calcium  8.9 - 10.3 mg/dL 8.7 (L)  Anion gap 5 - 15  NOT CALCULATED  Beta-Hydroxybutyric Acid 0.05 - 0.27 mmol/L >8.00 (H)    Latest Reference Range & Units 09/10/24 15:09  Hemoglobin A1C 4.0 - 5.6 % 8.3 !   Review of Glycemic Control  Diabetes history: DM1 Outpatient Diabetes medications: OmniPod insulin  pump with Novolog ; Tresiba  14 units at bedtime, Novolog  meal coverage and SSI (when not using pump), Dexcom G7 Current orders for Inpatient glycemic control: Lantus  12 units daily, Novolog  0-9 units Q4H  Inpatient Diabetes Program Recommendations:    Insulin : Patient was initially on IV insulin ; now has SQ insulin   orders.  NOTE: Patient has DM1 hx and uses an OmniPod insulin  pump and Dexcom G7 for DM control. Patient was recently inpatient 08/10/24-08/11/24 and inpatient diabetes coordinator spoke with patient's mother on 08/11/24 and they were having an issue with connecting the Dexcom G7 CGM to the OmniPod. Patient seen MICAEL Rio, NP on 09/10/24 and per office note, no CGM or CBG data to review as patient has had trouble connecting Dexcom G7 to the Omnipod, patient was entering 99 grams of carbs for everything he eats which caused some issues with hypoglycemia; no pump changes were made and patient and his mother were advised to contact [employer] to troubleshoot issue with Dexcom G7 and OmniPod.   Addendum 09/17/24@12 :45-Went by to talk to patient at 10:30 today. Patient awake in bed. When asked about DM, he stated that his mother knew all about his DM and could tell me more. When asked if he was wearing his OmniPod when he came in, he asked what is an Omnipod. Discussed the the OmniPod is the insulin  pump that gives him insulin  and helps keep DM controlled. Patient then stated, Oh okay. Yes I have my insulin  pump on my arm. Asked patient to show me where he has the insulin  pump on at  currently. He showed me his arm with the Dexcom G7 sensor on (back of left upper arm). Explained to patient that the Dexcom G7 sensor is just a sensor to monitor his glucose. Patient did not have anything attached to his right arm.  Patient states that his mother does most everything with the pump and that he does not do much with it. Asked if he was bolusing insulin  for carbs and he stated Sometimes, but not much.  Patient is not sure what his glucose usually runs but states that his sugar shows up on his insulin  pump. Informed patient I would call his mother to get more information. Called [name] Johnson (patient's mother) and she states that patient uses the OmniPod and the Dexcom G7 for DM control. She states that patient has  plenty of insulin  pump supplies and sensors at home. Inquired if patient uses manual or auto mode on pump and she did not know what that meant. Discussed manual and auto mode on the OmniPod pump.  She reports that they use manual mode but she will talk with [employer] (OmniPod representative) about seeing how to put the pump in auto mode.  She states that patient gives himself insulin  bolus for high carb foods (such as pizza, chips, snacks). She reports that patient's glucose had been running in the 100's lately and that she was able to work with [employer] (the [employer]) and was able to get the Omnipod and Dexcom G7 connected. Discussed initial glucose 949 mg/dl and inquired about any reasons for such high glucose. She states that patient is bad about getting up during the night and eating snack and food and drinking regular sodas. She states that she found several empty soda cans in his room since bringing him to the hospital. She states that they usually have to lock up food and drinks to keep him for getting into them during the day and night.  Encouraged [name] to work with Julia (OmniPod representative) to learn more about the pump as well as using auto and manual modes. She reports that she can bring pump supplies to patient at the hospital if provider wants him to resume using the pump. Discussed that DKA was treated with IV insulin  and he has just been transitioned to SQ insulin  regimen today.  Discussed importance of DM control to prevent further complications from uncontrolled DM.  [name] verbalized understanding of information and has no questions or concerns at this time. Would recommend to use SQ insulin  while inpatient especially given that patient does not appear to have much knowledge about it himself to use it independently.    Thanks, Earnie Gainer, RN, MSN, CDCES Diabetes Coordinator Inpatient Diabetes Program [phone] (Team Pager from 8am to 5pm)

## 2024-09-18 DIAGNOSIS — G9341 Metabolic encephalopathy: Secondary | ICD-10-CM | POA: Diagnosis not present

## 2024-09-18 DIAGNOSIS — E0811 Diabetes mellitus due to underlying condition with ketoacidosis with coma: Secondary | ICD-10-CM

## 2024-09-18 DIAGNOSIS — N185 Chronic kidney disease, stage 5: Secondary | ICD-10-CM | POA: Diagnosis not present

## 2024-09-18 LAB — GLUCOSE, CAPILLARY
Glucose-Capillary: 157 mg/dL — ABNORMAL HIGH (ref 70–99)
Glucose-Capillary: 138 mg/dL — ABNORMAL HIGH (ref 70–99)
Glucose-Capillary: 113 mg/dL — ABNORMAL HIGH (ref 70–99)
Glucose-Capillary: 184 mg/dL — ABNORMAL HIGH (ref 70–99)
Glucose-Capillary: 192 mg/dL — ABNORMAL HIGH (ref 70–99)

## 2024-09-18 LAB — CBC
HCT: 24.4 % — ABNORMAL LOW (ref 39.0–52.0)
Hemoglobin: 8.2 g/dL — ABNORMAL LOW (ref 13.0–17.0)
MCH: 29.9 pg (ref 26.0–34.0)
MCHC: 33.6 g/dL (ref 30.0–36.0)
MCV: 89.1 fL (ref 80.0–100.0)
Platelets: 178 K/uL (ref 150–400)
RBC: 2.74 MIL/uL — ABNORMAL LOW (ref 4.22–5.81)
RDW: 16.6 % — ABNORMAL HIGH (ref 11.5–15.5)
WBC: 9.5 K/uL (ref 4.0–10.5)
nRBC: 0 % (ref 0.0–0.2)

## 2024-09-18 LAB — RENAL FUNCTION PANEL
Albumin: 3.5 g/dL (ref 3.5–5.0)
Anion gap: 14 (ref 5–15)
BUN: 66 mg/dL — ABNORMAL HIGH (ref 6–20)
CO2: 22 mmol/L (ref 22–32)
Calcium: 7.8 mg/dL — ABNORMAL LOW (ref 8.9–10.3)
Chloride: 102 mmol/L (ref 98–111)
Creatinine, Ser: 6.36 mg/dL — ABNORMAL HIGH (ref 0.61–1.24)
GFR, Estimated: 11 mL/min — ABNORMAL LOW
Glucose, Bld: 158 mg/dL — ABNORMAL HIGH (ref 70–99)
Phosphorus: 5.3 mg/dL — ABNORMAL HIGH (ref 2.5–4.6)
Potassium: 3.7 mmol/L (ref 3.5–5.1)
Sodium: 138 mmol/L (ref 135–145)

## 2024-09-18 MED ORDER — INSULIN ASPART 100 UNIT/ML IJ SOLN
3.0000 [IU] | Freq: Three times a day (TID) | INTRAMUSCULAR | Status: DC
  Administered 2024-09-18 – 2024-09-20 (×6): 3 [IU] via SUBCUTANEOUS
  Filled 2024-09-18 (×4): qty 1

## 2024-09-18 MED ORDER — SODIUM CHLORIDE 0.9 % IV SOLN
200.0000 mg | INTRAVENOUS | Status: AC
  Administered 2024-09-18 – 2024-09-19 (×2): 200 mg via INTRAVENOUS
  Filled 2024-09-18 (×2): qty 10

## 2024-09-18 MED ORDER — LABETALOL HCL 200 MG PO TABS
200.0000 mg | ORAL_TABLET | Freq: Two times a day (BID) | ORAL | Status: DC
  Administered 2024-09-18 – 2024-09-20 (×5): 200 mg via ORAL
  Filled 2024-09-18 (×5): qty 1

## 2024-09-18 NOTE — Progress Notes (Addendum)
 "          PROGRESS NOTE  Nathaniel [name] FMW:[id number] DOB: 21-Sep-1985 DOA: 09/16/2024 PCP: Carlette [name], MD  Brief History:  38 year old male with a history of diabetes mellitus type 1 on Omnipod insulin ,  history of cardiac arrest in the past due to DKA/electrolyte derangement, necrotizing fasciitis Jan 2024, CKD 5,  hyperlipidemia, hypertension, CVA presenting with somnolence and elevated CBGs. Patient diagnosed with Type 1 diabetes at 38 years of age  Patient is reportedly on an OmniPod with Dexcom monitoring.  However, review of his last endocrine notes shows that the patient seems to have poor insight regarding management of his Dexcom and Omnipod. Review of the [facility] record shows long history of poor compliance.  It was reported that the patient has had his Omnipod on, but he has not been taking any of his other medications. Notably, the patient was recently admitted to the hospital from 08/10/2024 to 08/11/2024 for DKA.  He was discharged home to restart his Omnipod insulin .  In the ED, the patient was afebrile and hemodynamically stable with oxygen saturation 96-100% room air. Sodium 128, potassium 4.5, bicarbonate <7 with anion gap that was unable to be calculated.  WBC 20.3, hemoglobin 11.1, platelet 267. The patient was started on insulin  drip and IV fluids. The patient's DKA improved, and he was transitioned to subcutaneous insulin . However, the patient remained somnolent.  There was concern the patient's progression of CKD may be contributing.  Nephrology was consulted to assist with management. The patient's DKA resolved.  He was transitioned to subcutaneous insulin .  Unfortunately, his mental status was slow to improve.  There was concern for uremia.  Fortunately, on 09/18/2024, the patient's mental status improved back to baseline.   Assessment/Plan: DKA Type 1 --patient started on IV insulin  with q 1 hour CBG check and q 4 hour BMPs -pt started on aggressive  fluid resuscitation -Electrolytes were monitored and repleted -transitioned to [place] insulin  once anion gap closed -diet was advanced once anion gap closed -09/10/24 HbA1C--8.3   Acute metabolic encephalopathy - 09/17/2024--patient remains somewhat somnolent - Concern progression of his CKD contributing to encephalopathy - Nephrology consulted - B12--759 - Folate--6.8 - TSH--1.030 - Ammonia--21 - UDS +THC - UA--no pyuria - CT brain--no acute findings -09/18/2024--- mental status back to baseline   Acute on chronic renal failure stage IV - Baseline creatinine 5.9-6.1 - Nephrology consult appreciated - serum creatinine trending up   Uncontrolled diabetes mellitus type 1 with hyperglycemia - 09/17/2024--start Semglee  12 units - Started lispro every 4 hours sliding scale - continue lispro 3 units with meals   Essential hypertension - Restart labetalol  and amlodipine  -Increase labetalol  to 200 mg twice daily   Mixed hyperlipidemia - Continue statin   History of stroke - Continue Plavix    Hyperkalemia - Improved with insulin  and fluids       Family Communication:   no Family at bedside   Consultants:  renal   Code Status:  FULL    DVT Prophylaxis:  [place] Heparin      Procedures: As Listed in Progress Note Above   Antibiotics: None          Subjective: Patient denies fevers, chills, headache, chest pain, dyspnea, nausea, vomiting, diarrhea, abdominal pain, dysuria, hematuria, hematochezia, and melena.   Objective: Vitals:   09/18/24 0300 09/18/24 0319 09/18/24 0400 09/18/24 0500  BP: (!) 144/68  124/64 126/86  Pulse:      Resp: (!) 25  11 11   Temp:  97.7 F (36.5 C)    TempSrc:  Oral    SpO2:      Weight:      Height:        Intake/Output Summary (Last 24 hours) at 09/18/2024 0723 Last data filed at 09/17/2024 2354 Gross per 24 hour  Intake 720.89 ml  Output 850 ml  Net -129.11 ml   Weight change:  Exam:  General:  Pt is alert, follows  commands appropriately, not in acute distress HEENT: No icterus, No thrush, No neck mass, [place]/AT Cardiovascular: RRR, S1/S2, no rubs, no gallops Respiratory: CTA bilaterally, no wheezing, no crackles, no rhonchi Abdomen: Soft/+BS, non tender, non distended, no guarding Extremities: No edema, No lymphangitis, No petechiae, No rashes, no synovitis   Data Reviewed: I have personally reviewed following labs and imaging studies Basic Metabolic Panel: Recent Labs  Lab 09/16/24 2302 09/17/24 0238 09/17/24 0721 09/17/24 1115 09/18/24 0259  NA 132* 133* 135 134* 138  K 4.2 4.0 4.0 4.3 3.7  CL 91* 96* 98 96* 102  CO2 14* 20* 21* 14* 22  GLUCOSE 525* 216* 155* 275* 158*  BUN 76* 73* 72* 72* 66*  CREATININE 5.94* 6.12* 6.18* 6.00* 6.36*  CALCIUM  7.9* 7.8* 7.8* 8.0* 7.8*  MG  --  1.6*  --   --   --   PHOS  --  5.5*  --   --  5.3*   Liver Function Tests: Recent Labs  Lab 09/18/24 0259  ALBUMIN 3.5   No results for input(s): LIPASE, AMYLASE in the last 168 hours. Recent Labs  Lab 09/17/24 1115  AMMONIA 21   Coagulation Profile: No results for input(s): INR, PROTIME in the last 168 hours. CBC: Recent Labs  Lab 09/16/24 1847 09/17/24 0238 09/18/24 0259  WBC 20.3* 13.9* 9.5  HGB 11.1* 8.5* 8.2*  HCT 34.6* 24.6* 24.4*  MCV 94.0 86.9 89.1  PLT 267 207 178   Cardiac Enzymes: No results for input(s): CKTOTAL, CKMB, CKMBINDEX, TROPONINI in the last 168 hours. BNP: Invalid input(s): POCBNP CBG: Recent Labs  Lab 09/17/24 1135 09/17/24 1554 09/17/24 1903 09/17/24 2344 09/18/24 0318  GLUCAP 301* 252* 141* 313* 157*   HbA1C: No results for input(s): HGBA1C in the last 72 hours. Urine analysis:    Component Value Date/Time   COLORURINE YELLOW 09/17/2024 0822   APPEARANCEUR HAZY (A) 09/17/2024 0822   LABSPEC 1.009 09/17/2024 0822   PHURINE 5.0 09/17/2024 0822   GLUCOSEU >=500 (A) 09/17/2024 0822   HGBUR NEGATIVE 09/17/2024 0822   BILIRUBINUR  NEGATIVE 09/17/2024 0822   KETONESUR 5 (A) 09/17/2024 0822   PROTEINUR >=300 (A) 09/17/2024 0822   UROBILINOGEN 0.2 06/07/2015 1815   NITRITE NEGATIVE 09/17/2024 0822   LEUKOCYTESUR NEGATIVE 09/17/2024 0822   Sepsis Labs: @LABRCNTIP (procalcitonin:4,lacticidven:4) ) Recent Results (from the past 240 hours)  MRSA Next Gen by PCR, Nasal     Status: None   Collection Time: 09/17/24  4:41 AM   Specimen: Nasal Mucosa; Nasal Swab  Result Value Ref Range Status   MRSA by PCR Next Gen NOT DETECTED NOT DETECTED Final    Comment: (NOTE) The GeneXpert MRSA Assay (FDA approved for NASAL specimens only), is one component of a comprehensive MRSA colonization surveillance program. It is not intended to diagnose MRSA infection nor to guide or monitor treatment for MRSA infections. Test performance is not FDA approved in patients less than 71 years old. Performed at [facility], [address], [place], [place] [address]      Scheduled  Meds:  amLODipine   10 mg Oral Daily   atorvastatin   80 mg Oral QPM   Chlorhexidine  Gluconate Cloth  6 each Topical Daily   clopidogrel   75 mg Oral Daily   heparin   5,000 Units Subcutaneous Q8H   insulin  aspart  0-9 Units Subcutaneous Q4H   insulin  glargine  12 Units Subcutaneous Daily   labetalol   200 mg Oral BID   Continuous Infusions:  Procedures/Studies: US  RENAL Result Date: 09/18/2024 EXAM: US  Retroperitoneum Complete, Renal. 09/17/2024 12:08:07 PM TECHNIQUE: Real-time ultrasonography of the retroperitoneum renal was performed. COMPARISON: US  Renal 05/01/2019. CLINICAL HISTORY: AKI (acute kidney injury). FINDINGS: FINDINGS: RIGHT KIDNEY/URETER: Right kidney measures 9.0 x 3.9 x 5.5 cm. The right kidney is diffusely increased in echogenicity, suggesting underlying renal medical disease. No hydronephrosis. No calculus. No mass. LEFT KIDNEY/URETER: Left kidney measures 8.9 x 4.1 x 3.6 cm. Normal cortical echogenicity. No hydronephrosis. No calculus. No mass.  BLADDER: Unremarkable appearance of the bladder. IMPRESSION: 1. Right kidney with diffusely increased echogenicity, suggesting underlying medical renal disease. 2. Left kidney without acute abnormality. Electronically signed by: Evalene Coho MD 09/18/2024 04:29 AM EST RP Workstation: [id number]   CT HEAD WO CONTRAST ( ) Result Date: 09/18/2024 EXAM: CT HEAD WITHOUT CONTRAST 09/17/2024 12:44:20 PM TECHNIQUE: CT of the head was performed without the administration of intravenous contrast. Automated exposure control, iterative reconstruction, and/or weight based adjustment of the mA/kV was utilized to reduce the radiation dose to as low as reasonably achievable. COMPARISON: 08/11/2024 CLINICAL HISTORY: Mental status change, unknown cause FINDINGS: BRAIN AND VENTRICLES: Small remote bilateral cerebellar lacunar infarcts. Mild periventricular white matter changes, likely sequela of chronic small vessel ischemic disease. Remote lacunar infarct in right thalamus. Intracranial arterial calcification. No acute hemorrhage. No evidence of acute infarct. No hydrocephalus. No extra-axial collection. No mass effect or midline shift. ORBITS: Remote medial right orbital wall fracture. Bilateral lens replacement noted. SINUSES: Ethmoid air cells mucosal thickening. Left mastoid effusion. SOFT TISSUES AND SKULL: No acute soft tissue abnormality. No skull fracture. IMPRESSION: 1. No acute intracranial abnormality. 2. Small remote bilateral cerebellar lacunar infarcts, mild periventricular white matter changes likely sequela of chronic small vessel ischemic disease, and remote lacunar infarct in the right thalamus. 3. Remote medial right orbital wall fracture. Bilateral lens replacements. 4. Ethmoid air cell mucosal thickening. Left mastoid effusion. Electronically signed by: Evalene Coho MD 09/18/2024 04:27 AM EST RP Workstation: DARYLENE Alm Schneider, DO  [facility]  If 7PM-7AM, please contact  night-coverage [email or web address] Password [email or web address] 09/18/2024, 7:23 AM   LOS: 1 day   "

## 2024-09-18 NOTE — TOC Initial Note (Signed)
 Transition of Care [facility]) - Initial/Assessment Note    Patient Details  Name: Nathaniel [name] MRN: [id number] Date of Birth: 10-27-1985  [facility] CM/SW Contact:    Lucie Lunger, LCSWA Phone Number: 09/18/2024, 10:54 AM  Clinical Narrative:                 CSW notes pt is high risk for readmission. CSW spoke with pts mother who states pt lives with her. Pt is independent in completing his ADLs. Pt has transportation provided by his mother. Pt has a cane but ambulates independently. Pts mother states they are agreeable to [facility] being arranged, no agency preference. CSW to send [facility] referral in HUB for agency review. TOC to follow.   Expected Discharge Plan: Home w Home Health Services Barriers to Discharge: Continued Medical Work up   Patient Goals and CMS Choice Patient states their goals for this hospitalization and ongoing recovery are:: reutrn home CMS Medicare.gov Compare Post Acute Care list provided to:: Patient Represenative (must comment) Choice offered to / list presented to : [facility] POA / Guardian      Expected Discharge Plan and Services In-house Referral: Clinical Social Work Discharge Planning Services: CM Consult Post Acute Care Choice: Home Health Living arrangements for the past 2 months: Single Family Home                                      Prior Living Arrangements/Services Living arrangements for the past 2 months: Single Family Home Lives with:: Parents Patient language and need for interpreter reviewed:: Yes Do you feel safe going back to the place where you live?: Yes      Need for Family Participation in Patient Care: Yes (Comment) Care giver support system in place?: Yes (comment) Current home services: DME Criminal Activity/Legal Involvement Pertinent to Current Situation/Hospitalization: No - Comment as needed  Activities of Daily Living   ADL Screening (condition at time of admission) Independently performs ADLs?:  No Does the patient have a NEW difficulty with bathing/dressing/toileting/self-feeding that is expected to last >3 days?: Yes (Initiates electronic notice to provider for possible OT consult) Does the patient have a NEW difficulty with getting in/out of bed, walking, or climbing stairs that is expected to last >3 days?: Yes (Initiates electronic notice to provider for possible PT consult) Does the patient have a NEW difficulty with communication that is expected to last >3 days?: No Is the patient deaf or have difficulty hearing?: No Does the patient have difficulty seeing, even when wearing glasses/contacts?: No Does the patient have difficulty concentrating, remembering, or making decisions?: No  Permission Sought/Granted                  Emotional Assessment Appearance:: Appears stated age Attitude/Demeanor/Rapport: Engaged Affect (typically observed): Accepting Orientation: : Oriented to Self, Oriented to Place, Oriented to  Time, Oriented to Situation Alcohol / Substance Use: Not Applicable Psych Involvement: No (comment)  Admission diagnosis:  DKA (diabetic ketoacidosis) (HCC) [E11.10] Diabetic ketoacidosis without coma associated with type 1 diabetes mellitus (HCC) [E10.10] Patient Active Problem List   Diagnosis Date Noted   CKD (chronic kidney disease) stage 5, GFR less than 15 ml/min (HCC) 09/17/2024   Anemia of chronic renal failure 09/04/2024   Cardiac arrest, cause unspecified (HCC) 10/18/2022   Septic shock (HCC) 10/18/2022   Acute respiratory failure (HCC) 10/18/2022   Primary open angle glaucoma  of left eye, mild stage 12/21/2021   SIRS (systemic inflammatory response syndrome) (HCC) 07/15/2020   Acute metabolic encephalopathy due to DKA/AKI/Dehydration 07/15/2020   DKA (diabetic ketoacidosis) (HCC) 07/14/2020   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 determined by examination associated with type 1 diabetes mellitus (HCC) 01/28/2020    Stable treated proliferative diabetic retinopathy of left eye without macular edema determined by examination associated with type 1 diabetes mellitus (HCC) 01/28/2020   Pseudophakia 01/28/2020   CKD stage 3 secondary to diabetes (HCC)    Hypoglycemia    Neurosyphilis    Mixed hyperlipidemia    Acute ischemic left MCA stroke (HCC) 02/22/2018   Abnormality of gait following cerebrovascular accident (CVA)    Type 1 diabetes mellitus with peripheral circulatory complications (HCC)    Essential hypertension    Dyslipidemia    Syphilis    DKA (diabetic ketoacidosis) (HCC) 02/16/2018   CVA (cerebral vascular accident) (HCC) 02/16/2018   Acute renal failure (ARF) 10/21/2016   Diabetic ketoacidosis without coma associated with type 1 diabetes mellitus (HCC)    Hyperbilirubinemia    Hyponatremia 04/15/2016   DM (diabetes mellitus) type 1, uncontrolled, with ketoacidosis (HCC) 04/15/2016   Hyperglycemia 04/15/2016   AKI (acute kidney injury) 01/04/2016   Malignant hypertension 01/04/2016   Noncompliance with medications 01/04/2016   Intractable nausea and vomiting 04/01/2015   Gastroparesis 04/01/2015   Type 1 diabetes mellitus with complication (HCC) 04/01/2015   Chronic hypertension    Nausea with vomiting    Abscess, gluteal, right 06/21/2014   Nausea and vomiting 04/08/2013   Hyperglycemia without ketosis 04/08/2013   Essential hypertension, benign 04/08/2013   Hypercalcemia 07/29/2011   Vomiting 07/28/2011   Leukocytosis 07/28/2011   Hypokalemia 07/28/2011   Dehydration 07/27/2011   Smoker 07/27/2011   Marijuana abuse 07/27/2011   PCP:  Carlette [name], MD Pharmacy:   [facility] [phone] - [place], [place] - [address] AT [facility] [address] [place] [address] Phone: [phone] Fax: [phone]  [facility] [place] (New Address) - [place], [place] - [address] AT Previously: [address]iana [address], [place] [address] [place] [address] Phone: [phone] Fax: [phone]     Social Drivers of Health (SDOH) Social History: SDOH Screenings   Food Insecurity: No Food Insecurity (09/17/2024)  Housing: Low Risk (09/17/2024)  Transportation Needs: No Transportation Needs (09/17/2024)  Utilities: Not At Risk (09/17/2024)  Depression (PHQ2-9): Low Risk (09/11/2024)  Financial Resource Strain: Low Risk (01/17/2023)  Tobacco Use: High Risk (09/10/2024)   SDOH Interventions:     Readmission Risk Interventions    08/11/2024   12:44 PM 10/20/2022    3:34 PM  Readmission Risk Prevention Plan  Transportation Screening Complete Complete  HRI or Home Care Consult Complete Complete  Social Work Consult for Recovery Care Planning/Counseling Complete Complete  Palliative Care Screening Not Applicable Not Applicable  Medication Review Oceanographer) Complete Referral to Pharmacy

## 2024-09-18 NOTE — Progress Notes (Signed)
 Physical Therapy Treatment Patient Details Name: Nathaniel [name] MRN: [id number] DOB: 01-03-1986 Today's Date: 09/18/2024   History of Present Illness Nathaniel [name] is a 38 y.o. male with medical history significant of hypertension, hyperlipidemia, CVA, CKD 3B, type 1 diabetes mellitus, on insulin  pump at home, history of cardiac arrest in the past due to DKA/electrolyte derangement, necrotizing fasciitis last year, history of paresis who presents emergency department due to high levels of glucose in blood.  At bedside, patient was somnolent, was arousable, woke her glucose back to sleep.[name]  History was obtained from EDP and mother at bedside.  Per report, patient's blood glucose was reading high .  It was reported that patient has been taking his insulin  without taking other medications.  He presented with several episodes of nonbloody vomiting this morning, but no report of fever, chills, chest pain, shortness of breath.     He was recently admitted from 11/21 to 11/22 due to diabetes mellitus type 1, poorly controlled with severe DKA    PT Comments  Patient presents seated in chair (assisted by nursing staff) and agreeable for therapy. Patient demonstrates fair return for completing BLE ROM/strengthening exercises, but requires repeated verbal cueing, increased endurance distance for gait training with slow labored movement without loss of balance and limited mostly due to fatigue. Patient tolerated sitting up in wheelchair to be taken to 300 after therapy with his mother and sister present. Patient will benefit from continued skilled physical therapy in hospital and recommended venue below to increase strength, balance, endurance for safe ADLs and gait.       If plan is discharge home, recommend the following: A little help with bathing/dressing/bathroom;Help with stairs or ramp for entrance;Assistance with cooking/housework;A lot of help with bathing/dressing/bathroom   Can travel by  private vehicle        Equipment Recommendations  None recommended by PT    Recommendations for Other Services       Precautions / Restrictions Precautions Precautions: Fall Recall of Precautions/Restrictions: Impaired Restrictions Weight Bearing Restrictions Per Provider Order: No     Mobility  Bed Mobility               General bed mobility comments: Presents seated in chair (assisted by nursing staff)    Transfers Overall transfer level: Needs assistance Equipment used: Rolling walker (2 wheels) Transfers: Sit to/from Stand, Bed to chair/wheelchair/BSC Sit to Stand: Contact guard assist, Min assist   Step pivot transfers: Contact guard assist, Min assist       General transfer comment: unsteady labored movement    Ambulation/Gait Ambulation/Gait assistance: Min assist Gait Distance (Feet): 60 Feet Assistive device: Rolling walker (2 wheels) Gait Pattern/deviations: Decreased step length - right, Decreased step length - left, Decreased stride length, Trunk flexed Gait velocity: dec     General Gait Details: increased endurance/distance for gait training with slow labored movement without loss of balance, but requires increased time for making turns   Optometrist     Tilt Bed    Modified Rankin (Stroke Patients Only)       Balance Overall balance assessment: Needs assistance Sitting-balance support: Feet supported, No upper extremity supported Sitting balance-Leahy Scale: Good Sitting balance - Comments: seated in chair   Standing balance support: Reliant on assistive device for balance, During functional activity, Bilateral upper extremity supported Standing balance-Leahy Scale: Fair Standing balance comment: using RW  Communication Communication Communication: No apparent difficulties  Cognition Arousal: Alert Behavior During Therapy: WFL for tasks  assessed/performed                             Following commands: Impaired Following commands impaired: Follows one step commands with increased time    Cueing Cueing Techniques: Verbal cues, Tactile cues  Exercises General Exercises - Lower Extremity Ankle Circles/Pumps: Seated, AROM, Strengthening, 10 reps, Both Long Arc Quad: Seated, AROM, Strengthening, 10 reps, Both Hip Flexion/Marching: Seated, AROM, Strengthening, Both, 10 reps    General Comments        Pertinent Vitals/Pain Pain Assessment Pain Assessment: No/denies pain    Home Living                          Prior Function            PT Goals (current goals can now be found in the care plan section) Acute Rehab PT Goals Patient Stated Goal: return home with family to assist PT Goal Formulation: With patient Time For Goal Achievement: 09/21/24 Potential to Achieve Goals: Good Progress towards PT goals: Progressing toward goals    Frequency    Min 3X/week      PT Plan      Co-evaluation              AM-PAC PT 6 Clicks Mobility   Outcome Measure  Help needed turning from your back to your side while in a flat bed without using bedrails?: A Little Help needed moving from lying on your back to sitting on the side of a flat bed without using bedrails?: A Little Help needed moving to and from a bed to a chair (including a wheelchair)?: A Little Help needed standing up from a chair using your arms (e.g., wheelchair or bedside chair)?: A Little Help needed to walk in hospital room?: A Little Help needed climbing 3-5 steps with a railing? : A Lot 6 Click Score: 17    End of Session   Activity Tolerance: Patient tolerated treatment well;Patient limited by fatigue Patient left: in chair;with call bell/phone within reach;with family/visitor present Nurse Communication: Mobility status PT Visit Diagnosis: Unsteadiness on feet (R26.81);Other abnormalities of gait and mobility  (R26.89);Muscle weakness (generalized) (M62.81)     Time: 1510-1535 PT Time Calculation (min) (ACUTE ONLY): 25 min  Charges:    $Gait Training: 8-22 mins $Therapeutic Exercise: 8-22 mins PT General Charges $$ ACUTE PT VISIT: 1 Visit                     3:48 PM, 09/18/2024 Nathaniel [name], MPT Physical Therapist with [facility] 336 [phone] office [phone] mobile phone

## 2024-09-18 NOTE — Plan of Care (Signed)

## 2024-09-18 NOTE — Progress Notes (Signed)
 " [facility] Progress Note   Subjective:   Seen in room c/o multiple episodes of painless watery diarrhea overnight.  Otherwise feeling well and is oriented. I/Os 720/850 yesterday.  Denies dyspnea, dysgeusia, confusion.   Objective Vitals:   09/18/24 0319 09/18/24 0400 09/18/24 0500 09/18/24 0700  BP:  124/64 126/86   Pulse:      Resp:  11 11   Temp: 97.7 F (36.5 C)   97.9 F (36.6 C)  TempSrc: Oral   Oral  SpO2:      Weight:      Height:       Physical Exam General: sitting comfortably in bedisde chair Heart:RRR no rub Lungs: clear Abdomen: soft Extremities: no edema Dialysis Access: AVG no thrill or bruit  Additional Objective Labs: Basic Metabolic Panel: Recent Labs  Lab 09/17/24 0238 09/17/24 0721 09/17/24 1115 09/18/24 0259  NA 133* 135 134* 138  K 4.0 4.0 4.3 3.7  CL 96* 98 96* 102  CO2 20* 21* 14* 22  GLUCOSE 216* 155* 275* 158*  BUN 73* 72* 72* 66*  CREATININE 6.12* 6.18* 6.00* 6.36*  CALCIUM  7.8* 7.8* 8.0* 7.8*  PHOS 5.5*  --   --  5.3*   Liver Function Tests: Recent Labs  Lab 09/18/24 0259  ALBUMIN 3.5   No results for input(s): LIPASE, AMYLASE in the last 168 hours. CBC: Recent Labs  Lab 09/16/24 1847 09/17/24 0238 09/18/24 0259  WBC 20.3* 13.9* 9.5  HGB 11.1* 8.5* 8.2*  HCT 34.6* 24.6* 24.4*  MCV 94.0 86.9 89.1  PLT 267 207 178   Blood Culture    Component Value Date/Time   SDES  08/10/2024 1437    BLOOD RIGHT WRIST BOTTLES DRAWN AEROBIC AND ANAEROBIC   SDES  08/10/2024 1437    RIGHT ANTECUBITAL BOTTLES DRAWN AEROBIC AND ANAEROBIC   SPECREQUEST Blood Culture adequate volume 08/10/2024 1437   SPECREQUEST Blood Culture adequate volume 08/10/2024 1437   CULT  08/10/2024 1437    NO GROWTH 5 DAYS Performed at [facility], [address] [place], [place] [address]    CULT  08/10/2024 1437    NO GROWTH 5 DAYS Performed at [facility], [address], [place], [place] [address]    REPTSTATUS 08/15/2024 FINAL  08/10/2024 1437   REPTSTATUS 08/15/2024 FINAL 08/10/2024 1437    Cardiac Enzymes: No results for input(s): CKTOTAL, CKMB, CKMBINDEX, TROPONINI in the last 168 hours. CBG: Recent Labs  Lab 09/17/24 1554 09/17/24 1903 09/17/24 2344 09/18/24 0318 09/18/24 0747  GLUCAP 252* 141* 313* 157* 138*   Iron  Studies:  Recent Labs    09/17/24 1115  IRON  41*  TIBC 245*  FERRITIN 570*   @lablastinr3 @ Studies/Results: US  RENAL Result Date: 09/18/2024 EXAM: US  Retroperitoneum Complete, Renal. 09/17/2024 12:08:07 PM TECHNIQUE: Real-time ultrasonography of the retroperitoneum renal was performed. COMPARISON: US  Renal 05/01/2019. CLINICAL HISTORY: AKI (acute kidney injury). FINDINGS: FINDINGS: RIGHT KIDNEY/URETER: Right kidney measures 9.0 x 3.9 x 5.5 cm. The right kidney is diffusely increased in echogenicity, suggesting underlying renal medical disease. No hydronephrosis. No calculus. No mass. LEFT KIDNEY/URETER: Left kidney measures 8.9 x 4.1 x 3.6 cm. Normal cortical echogenicity. No hydronephrosis. No calculus. No mass. BLADDER: Unremarkable appearance of the bladder. IMPRESSION: 1. Right kidney with diffusely increased echogenicity, suggesting underlying medical renal disease. 2. Left kidney without acute abnormality. Electronically signed by: Evalene Coho MD 09/18/2024 04:29 AM EST RP Workstation: [id number]   CT HEAD WO CONTRAST ( ) Result Date: 09/18/2024 EXAM: CT HEAD WITHOUT CONTRAST  09/17/2024 12:44:20 PM TECHNIQUE: CT of the head was performed without the administration of intravenous contrast. Automated exposure control, iterative reconstruction, and/or weight based adjustment of the mA/kV was utilized to reduce the radiation dose to as low as reasonably achievable. COMPARISON: 08/11/2024 CLINICAL HISTORY: Mental status change, unknown cause FINDINGS: BRAIN AND VENTRICLES: Small remote bilateral cerebellar lacunar infarcts. Mild periventricular white matter changes, likely  sequela of chronic small vessel ischemic disease. Remote lacunar infarct in right thalamus. Intracranial arterial calcification. No acute hemorrhage. No evidence of acute infarct. No hydrocephalus. No extra-axial collection. No mass effect or midline shift. ORBITS: Remote medial right orbital wall fracture. Bilateral lens replacement noted. SINUSES: Ethmoid air cells mucosal thickening. Left mastoid effusion. SOFT TISSUES AND SKULL: No acute soft tissue abnormality. No skull fracture. IMPRESSION: 1. No acute intracranial abnormality. 2. Small remote bilateral cerebellar lacunar infarcts, mild periventricular white matter changes likely sequela of chronic small vessel ischemic disease, and remote lacunar infarct in the right thalamus. 3. Remote medial right orbital wall fracture. Bilateral lens replacements. 4. Ethmoid air cell mucosal thickening. Left mastoid effusion. Electronically signed by: Timothy Berrigan MD 09/18/2024 04:27 AM EST RP Workstation: [id number]   Medications:   amLODipine   10 mg Oral Daily   atorvastatin   80 mg Oral QPM   Chlorhexidine  Gluconate Cloth  6 each Topical Daily   clopidogrel   75 mg Oral Daily   heparin   5,000 Units Subcutaneous Q8H   insulin  aspart  0-9 Units Subcutaneous Q4H   insulin  glargine  12 Units Subcutaneous Daily   labetalol   200 mg Oral BID      Assessment/Plan: AKI on CKD4 -followed by us  at our [facility] office, typically seen by Dr. LULLA Stank. Baseline eGFR typically around 15-18 recently.  AKI likely in the context of DKA. Renal US  no obstruction.  UA known proteinuria. -BUN 66, Cr 6.4, eGFR 11:  no current indications for RRT.  -continue with supportive care - low threshhold to resume IVF if not taking good po or can't keep up with diarrheal losses -Avoid nephrotoxic medications including NSAIDs and iodinated intravenous contrast exposure unless the latter is absolutely indicated.  Preferred narcotic agents for pain control are hydromorphone,  fentanyl , and methadone. Morphine  should not be used. Avoid Baclofen and avoid oral sodium phosphate  and magnesium  citrate based laxatives / bowel preps. Continue strict Input and Output monitoring. Will monitor the patient closely with you and intervene or adjust therapy as indicated by changes in clinical status/labs    Acute metabolic encephalopathy -concern for uremia +/- DKA.  -improved and oriented today - do not think uremic  Diarrhea: new complaint, w/u per primary. As above IVF if developing assoc hypovolemia.   DKA -mgmt per primary service. S/p insulin  gtt, now on subcutaneous. S/p fluids   AGMA -secondary to DKA and AKI -now resolved   HTN -BP reasonably controlled   Anemia of CKD -received two doses of venofer  200mg  recently -Fe panel shows iron  sat 17%, will redose IV iron  now -transfuse prn for Hgb <7. Hgb 11.1 (likely hemoconcentrated)->8s now  Manuelita Barters MD 09/18/2024, 8:43 AM  [facility] Pager: [phone]   "

## 2024-09-19 ENCOUNTER — Encounter (HOSPITAL_COMMUNITY): Payer: Self-pay | Admitting: Internal Medicine

## 2024-09-19 DIAGNOSIS — G9341 Metabolic encephalopathy: Secondary | ICD-10-CM | POA: Diagnosis not present

## 2024-09-19 DIAGNOSIS — N185 Chronic kidney disease, stage 5: Secondary | ICD-10-CM | POA: Diagnosis not present

## 2024-09-19 DIAGNOSIS — E108 Type 1 diabetes mellitus with unspecified complications: Secondary | ICD-10-CM | POA: Diagnosis not present

## 2024-09-19 DIAGNOSIS — N179 Acute kidney failure, unspecified: Secondary | ICD-10-CM | POA: Diagnosis not present

## 2024-09-19 DIAGNOSIS — I10 Essential (primary) hypertension: Secondary | ICD-10-CM | POA: Diagnosis not present

## 2024-09-19 DIAGNOSIS — E1051 Type 1 diabetes mellitus with diabetic peripheral angiopathy without gangrene: Secondary | ICD-10-CM | POA: Diagnosis not present

## 2024-09-19 LAB — RENAL FUNCTION PANEL
Albumin: 3.6 g/dL (ref 3.5–5.0)
Anion gap: 13 (ref 5–15)
BUN: 61 mg/dL — ABNORMAL HIGH (ref 6–20)
CO2: 22 mmol/L (ref 22–32)
Calcium: 7.7 mg/dL — ABNORMAL LOW (ref 8.9–10.3)
Chloride: 104 mmol/L (ref 98–111)
Creatinine, Ser: 6.23 mg/dL — ABNORMAL HIGH (ref 0.61–1.24)
GFR, Estimated: 11 mL/min — ABNORMAL LOW
Glucose, Bld: 123 mg/dL — ABNORMAL HIGH (ref 70–99)
Phosphorus: 5.1 mg/dL — ABNORMAL HIGH (ref 2.5–4.6)
Potassium: 3.8 mmol/L (ref 3.5–5.1)
Sodium: 139 mmol/L (ref 135–145)

## 2024-09-19 LAB — GLUCOSE, CAPILLARY
Glucose-Capillary: 133 mg/dL — ABNORMAL HIGH (ref 70–99)
Glucose-Capillary: 141 mg/dL — ABNORMAL HIGH (ref 70–99)
Glucose-Capillary: 167 mg/dL — ABNORMAL HIGH (ref 70–99)
Glucose-Capillary: 192 mg/dL — ABNORMAL HIGH (ref 70–99)
Glucose-Capillary: 56 mg/dL — ABNORMAL LOW (ref 70–99)
Glucose-Capillary: 57 mg/dL — ABNORMAL LOW (ref 70–99)
Glucose-Capillary: 87 mg/dL (ref 70–99)
Glucose-Capillary: 90 mg/dL (ref 70–99)

## 2024-09-19 NOTE — Plan of Care (Signed)

## 2024-09-19 NOTE — Progress Notes (Signed)
 "          PROGRESS NOTE  Nathaniel [name] FMW:[id number] DOB: [birth year] DOA: 09/16/2024 PCP: Carlette [name], MD  Brief History:  38 year old male with a history of diabetes mellitus type 1 on Omnipod insulin ,  history of cardiac arrest in the past due to DKA/electrolyte derangement, necrotizing fasciitis Jan 2024, CKD 5,  hyperlipidemia, hypertension, CVA presenting with somnolence and elevated CBGs. Patient diagnosed with Type 1 diabetes at 38 years of age  Patient is reportedly on an OmniPod with Dexcom monitoring.  However, review of his last endocrine notes shows that the patient seems to have poor insight regarding management of his Dexcom and Omnipod. Review of the [facility] record shows long history of poor compliance.  It was reported that the patient has had his Omnipod on, but he has not been taking any of his other medications. Notably, the patient was recently admitted to the hospital from 08/10/2024 to 08/11/2024 for DKA.  He was discharged home to restart his Omnipod insulin .   In the ED, the patient was afebrile and hemodynamically stable with oxygen saturation 96-100% room air. Sodium 128, potassium 4.5, bicarbonate <7 with anion gap that was unable to be calculated.  WBC 20.3, hemoglobin 11.1, platelet 267. The patient was started on insulin  drip and IV fluids. The patient's DKA improved, and he was transitioned to subcutaneous insulin . However, the patient remained somnolent.  There was concern the patient's progression of CKD may be contributing.  Nephrology was consulted to assist with management. The patient's DKA resolved.  He was transitioned to subcutaneous insulin .  Unfortunately, his mental status was slow to improve.  There was concern for uremia.  Fortunately, on 09/18/2024, the patient's mental status improved back to baseline.    Assessment/Plan: DKA Type 1 --patient started on IV insulin  with q 1 hour CBG check and q 4 hour BMPs -pt started on aggressive  fluid resuscitation -Electrolytes were monitored and repleted -transitioned to [place] insulin  once anion gap closed -diet was advanced once anion gap closed -09/10/24 HbA1C--8.3 -Continue to follow CBGs fluctuation and further adjust hypoglycemic regimen as needed - Modified carbohydrate diet and medication compliance has been discussed with patient.   Acute metabolic encephalopathy - 09/17/2024--patient remains somewhat somnolent - Concern progression of his CKD contributing to encephalopathy - Nephrology consulted - B12--759 - Folate--6.8 - TSH--1.030 - Ammonia--21 - UDS +THC - UA--no pyuria - CT brain--no acute findings -09/18/2024--- mental status back to baseline -Mentation has remained stable - Continue constant reorientation and supportive care.   Acute on chronic renal failure stage IV - Baseline creatinine 5.9-6.1 - Nephrology consult appreciated - Creatinine down to 6.23 and GFR 11 - Continue to maintain adequate oral hydration - Continue to follow renal function trend - Patient will require close follow-up with nephrology after discharge.   Uncontrolled diabetes mellitus type 1 with hyperglycemia - 09/17/2024--start Semglee  12 units - Started lispro every 4 hours sliding scale - continue lispro 3 units with meals -Continue to follow CBG fluctuation and adjust hypoglycemic regimen as needed.   Essential hypertension - Continue amlodipine  - Continue adjusted dose of labetalol  - Low-sodium diet discussed with patient. - Maintain adequate hydration.   Mixed hyperlipidemia - Continue statin - Modified carbohydrate and heart healthy diet discussed with patient.   History of stroke - Continue Plavix  -No complaints of new neurologic deficits.   Hyperkalemia - Improved with insulin  and fluids -Continue to follow electrolytes trend.       Family Communication:  no Family at bedside   Consultants:  renal   Code Status:  FULL    DVT Prophylaxis:  [place]  Heparin      Procedures: As Listed in Progress Note Above   Antibiotics: None   Subjective: No fever, no chest pain, no nausea, no vomiting.  Patient reports decreased appetite but is otherwise feeling better.  Expressing concerns about the amount of carbohydrates in provided diet.   Objective: Vitals:   09/19/24 0011 09/19/24 0616 09/19/24 1005 09/19/24 1300  BP: (!) 140/88 (!) 144/87 (!) 164/101 124/78  Pulse: 95 87 91 95  Resp: 17 16 18 16   Temp: 97.8 F (36.6 C) 97.7 F (36.5 C) 99.1 F (37.3 C) 98.8 F (37.1 C)  TempSrc: Oral Oral Oral Oral  SpO2: 99% 100% 100% 100%  Weight:      Height:        Intake/Output Summary (Last 24 hours) at 09/19/2024 1827 Last data filed at 09/19/2024 1300 Gross per 24 hour  Intake 1030 ml  Output 550 ml  Net 480 ml   Weight change:  General exam: Alert, awake, oriented x 3; reports no nausea and no vomiting.  Reporting decreased appetite. Respiratory system: Good saturation on room air; no using accessory muscles. Cardiovascular system:RRR. No murmurs, rubs, gallops. Gastrointestinal system: Abdomen is nondistended, soft and nontender. No organomegaly or masses felt. Normal bowel sounds heard. Central nervous system: Alert and oriented. No focal neurological deficits. Extremities: No cyanosis, clubbing or edema. Skin: No petechiae. Psychiatry: Judgement and insight appear normal. Mood & affect appropriate.   Data Reviewed: I have personally reviewed following labs and imaging studies  Basic Metabolic Panel: Recent Labs  Lab 09/17/24 0238 09/17/24 0721 09/17/24 1115 09/18/24 0259 09/19/24 0447  NA 133* 135 134* 138 139  K 4.0 4.0 4.3 3.7 3.8  CL 96* 98 96* 102 104  CO2 20* 21* 14* 22 22  GLUCOSE 216* 155* 275* 158* 123*  BUN 73* 72* 72* 66* 61*  CREATININE 6.12* 6.18* 6.00* 6.36* 6.23*  CALCIUM  7.8* 7.8* 8.0* 7.8* 7.7*  MG 1.6*  --   --   --   --   PHOS 5.5*  --   --  5.3* 5.1*   Liver Function Tests: Recent Labs   Lab 09/18/24 0259 09/19/24 0447  ALBUMIN 3.5 3.6   No results for input(s): LIPASE, AMYLASE in the last 168 hours.  Recent Labs  Lab 09/17/24 1115  AMMONIA 21    CBC: Recent Labs  Lab 09/16/24 1847 09/17/24 0238 09/18/24 0259  WBC 20.3* 13.9* 9.5  HGB 11.1* 8.5* 8.2*  HCT 34.6* 24.6* 24.4*  MCV 94.0 86.9 89.1  PLT 267 207 178   CBG: Recent Labs  Lab 09/19/24 0012 09/19/24 0405 09/19/24 0716 09/19/24 1131 09/19/24 1630  GLUCAP 167* 133* 87 192* 90   HbA1C: No results for input(s): HGBA1C in the last 72 hours. Urine analysis:    Component Value Date/Time   COLORURINE YELLOW 09/17/2024 0822   APPEARANCEUR HAZY (A) 09/17/2024 0822   LABSPEC 1.009 09/17/2024 0822   PHURINE 5.0 09/17/2024 0822   GLUCOSEU >=500 (A) 09/17/2024 0822   HGBUR NEGATIVE 09/17/2024 0822   BILIRUBINUR NEGATIVE 09/17/2024 0822   KETONESUR 5 (A) 09/17/2024 0822   PROTEINUR >=300 (A) 09/17/2024 0822   UROBILINOGEN 0.2 06/07/2015 1815   NITRITE NEGATIVE 09/17/2024 0822   LEUKOCYTESUR NEGATIVE 09/17/2024 0822   Sepsis Labs: @LABRCNTIP (procalcitonin:4,lacticidven:4) ) Recent Results (from the past 240 hours)  MRSA Next Gen by PCR,  Nasal     Status: None   Collection Time: 09/17/24  4:41 AM   Specimen: Nasal Mucosa; Nasal Swab  Result Value Ref Range Status   MRSA by PCR Next Gen NOT DETECTED NOT DETECTED Final    Comment: (NOTE) The GeneXpert MRSA Assay (FDA approved for NASAL specimens only), is one component of a comprehensive MRSA colonization surveillance program. It is not intended to diagnose MRSA infection nor to guide or monitor treatment for MRSA infections. Test performance is not FDA approved in patients less than 24 years old. Performed at [facility], [address] [place], [place] [address]      Scheduled Meds:  amLODipine   10 mg Oral Daily   atorvastatin   80 mg Oral QPM   Chlorhexidine  Gluconate Cloth  6 each Topical Daily   clopidogrel   75 mg Oral Daily    heparin   5,000 Units Subcutaneous Q8H   insulin  aspart  0-9 Units Subcutaneous Q4H   insulin  aspart  3 Units Subcutaneous TID WC   insulin  glargine  12 Units Subcutaneous Daily   labetalol   200 mg Oral BID   Continuous Infusions:  Procedures/Studies: US  RENAL Result Date: 09/18/2024 EXAM: US  Retroperitoneum Complete, Renal. 09/17/2024 12:08:07 PM TECHNIQUE: Real-time ultrasonography of the retroperitoneum renal was performed. COMPARISON: US  Renal 05/01/2019. CLINICAL HISTORY: AKI (acute kidney injury). FINDINGS: FINDINGS: RIGHT KIDNEY/URETER: Right kidney measures 9.0 x 3.9 x 5.5 cm. The right kidney is diffusely increased in echogenicity, suggesting underlying renal medical disease. No hydronephrosis. No calculus. No mass. LEFT KIDNEY/URETER: Left kidney measures 8.9 x 4.1 x 3.6 cm. Normal cortical echogenicity. No hydronephrosis. No calculus. No mass. BLADDER: Unremarkable appearance of the bladder. IMPRESSION: 1. Right kidney with diffusely increased echogenicity, suggesting underlying medical renal disease. 2. Left kidney without acute abnormality. Electronically signed by: Evalene Coho MD 09/18/2024 04:29 AM EST RP Workstation: [id number]   CT HEAD WO CONTRAST ( ) Result Date: 09/18/2024 EXAM: CT HEAD WITHOUT CONTRAST 09/17/2024 12:44:20 PM TECHNIQUE: CT of the head was performed without the administration of intravenous contrast. Automated exposure control, iterative reconstruction, and/or weight based adjustment of the mA/kV was utilized to reduce the radiation dose to as low as reasonably achievable. COMPARISON: 08/11/2024 CLINICAL HISTORY: Mental status change, unknown cause FINDINGS: BRAIN AND VENTRICLES: Small remote bilateral cerebellar lacunar infarcts. Mild periventricular white matter changes, likely sequela of chronic small vessel ischemic disease. Remote lacunar infarct in right thalamus. Intracranial arterial calcification. No acute hemorrhage. No evidence of acute infarct.  No hydrocephalus. No extra-axial collection. No mass effect or midline shift. ORBITS: Remote medial right orbital wall fracture. Bilateral lens replacement noted. SINUSES: Ethmoid air cells mucosal thickening. Left mastoid effusion. SOFT TISSUES AND SKULL: No acute soft tissue abnormality. No skull fracture. IMPRESSION: 1. No acute intracranial abnormality. 2. Small remote bilateral cerebellar lacunar infarcts, mild periventricular white matter changes likely sequela of chronic small vessel ischemic disease, and remote lacunar infarct in the right thalamus. 3. Remote medial right orbital wall fracture. Bilateral lens replacements. 4. Ethmoid air cell mucosal thickening. Left mastoid effusion. Electronically signed by: Evalene Coho MD 09/18/2024 04:27 AM EST RP Workstation: [id number]    Eric Nunnery, MD [facility]  If 7PM-7AM, please contact night-coverage [email or web address] Password [email or web address] 09/19/2024, 6:27 PM   LOS: 2 days   "

## 2024-09-19 NOTE — Progress Notes (Signed)
 " [facility] Progress Note   Subjective:   Seen in room.  Diarrhea resolved. Otherwise feeling well and is oriented aside from 2021. I/Os 1000/350 yesterday.  Denies dyspnea, dysgeusia, confusion.   Objective Vitals:   09/18/24 1546 09/18/24 2158 09/19/24 0011 09/19/24 0616  BP: (!) 145/85 137/79 (!) 140/88 (!) 144/87  Pulse: 91 99 95 87  Resp:  20 17 16   Temp: 98.6 F (37 C) 98.5 F (36.9 C) 97.8 F (36.6 C) 97.7 F (36.5 C)  TempSrc: Oral Oral Oral Oral  SpO2:  99% 99% 100%  Weight:      Height:       Physical Exam General: sitting comfortably in bed Heart:RRR no rub Lungs: clear Abdomen: soft Extremities: no edema Dialysis Access: AVG no thrill or bruit Neuro: no asterixis, no tremor, oriented to [facility], self, 2021 but grossly nonfocal - sounds like his MS is always a bit off per outpt MD  Additional Objective Labs: Basic Metabolic Panel: Recent Labs  Lab 09/17/24 0238 09/17/24 0721 09/17/24 1115 09/18/24 0259 09/19/24 0447  NA 133*   < > 134* 138 139  K 4.0   < > 4.3 3.7 3.8  CL 96*   < > 96* 102 104  CO2 20*   < > 14* 22 22  GLUCOSE 216*   < > 275* 158* 123*  BUN 73*   < > 72* 66* 61*  CREATININE 6.12*   < > 6.00* 6.36* 6.23*  CALCIUM  7.8*   < > 8.0* 7.8* 7.7*  PHOS 5.5*  --   --  5.3* 5.1*   < > = values in this interval not displayed.   Liver Function Tests: Recent Labs  Lab 09/18/24 0259 09/19/24 0447  ALBUMIN 3.5 3.6   No results for input(s): LIPASE, AMYLASE in the last 168 hours. CBC: Recent Labs  Lab 09/16/24 1847 09/17/24 0238 09/18/24 0259  WBC 20.3* 13.9* 9.5  HGB 11.1* 8.5* 8.2*  HCT 34.6* 24.6* 24.4*  MCV 94.0 86.9 89.1  PLT 267 207 178   Blood Culture    Component Value Date/Time   SDES  08/10/2024 1437    BLOOD RIGHT WRIST BOTTLES DRAWN AEROBIC AND ANAEROBIC   SDES  08/10/2024 1437    RIGHT ANTECUBITAL BOTTLES DRAWN AEROBIC AND ANAEROBIC   SPECREQUEST Blood Culture adequate volume 08/10/2024 1437    SPECREQUEST Blood Culture adequate volume 08/10/2024 1437   CULT  08/10/2024 1437    NO GROWTH 5 DAYS Performed at [facility], [address], [place], [place] [address]    CULT  08/10/2024 1437    NO GROWTH 5 DAYS Performed at [facility], [address] [place], [place] [address]    REPTSTATUS 08/15/2024 FINAL 08/10/2024 1437   REPTSTATUS 08/15/2024 FINAL 08/10/2024 1437    Cardiac Enzymes: No results for input(s): CKTOTAL, CKMB, CKMBINDEX, TROPONINI in the last 168 hours. CBG: Recent Labs  Lab 09/18/24 1615 09/18/24 2110 09/19/24 0012 09/19/24 0405 09/19/24 0716  GLUCAP 184* 192* 167* 133* 87   Iron  Studies:  Recent Labs    09/17/24 1115  IRON  41*  TIBC 245*  FERRITIN 570*   @lablastinr3 @ Studies/Results: US  RENAL Result Date: 09/18/2024 EXAM: US  Retroperitoneum Complete, Renal. 09/17/2024 12:08:07 PM TECHNIQUE: Real-time ultrasonography of the retroperitoneum renal was performed. COMPARISON: US  Renal 05/01/2019. CLINICAL HISTORY: AKI (acute kidney injury). FINDINGS: FINDINGS: RIGHT KIDNEY/URETER: Right kidney measures 9.0 x 3.9 x 5.5 cm. The right kidney is diffusely increased in echogenicity, suggesting underlying renal medical  disease. No hydronephrosis. No calculus. No mass. LEFT KIDNEY/URETER: Left kidney measures 8.9 x 4.1 x 3.6 cm. Normal cortical echogenicity. No hydronephrosis. No calculus. No mass. BLADDER: Unremarkable appearance of the bladder. IMPRESSION: 1. Right kidney with diffusely increased echogenicity, suggesting underlying medical renal disease. 2. Left kidney without acute abnormality. Electronically signed by: Evalene Coho MD 09/18/2024 04:29 AM EST RP Workstation: [id number]   CT HEAD WO CONTRAST ( ) Result Date: 09/18/2024 EXAM: CT HEAD WITHOUT CONTRAST 09/17/2024 12:44:20 PM TECHNIQUE: CT of the head was performed without the administration of intravenous contrast. Automated exposure control, iterative reconstruction, and/or weight  based adjustment of the mA/kV was utilized to reduce the radiation dose to as low as reasonably achievable. COMPARISON: 08/11/2024 CLINICAL HISTORY: Mental status change, unknown cause FINDINGS: BRAIN AND VENTRICLES: Small remote bilateral cerebellar lacunar infarcts. Mild periventricular white matter changes, likely sequela of chronic small vessel ischemic disease. Remote lacunar infarct in right thalamus. Intracranial arterial calcification. No acute hemorrhage. No evidence of acute infarct. No hydrocephalus. No extra-axial collection. No mass effect or midline shift. ORBITS: Remote medial right orbital wall fracture. Bilateral lens replacement noted. SINUSES: Ethmoid air cells mucosal thickening. Left mastoid effusion. SOFT TISSUES AND SKULL: No acute soft tissue abnormality. No skull fracture. IMPRESSION: 1. No acute intracranial abnormality. 2. Small remote bilateral cerebellar lacunar infarcts, mild periventricular white matter changes likely sequela of chronic small vessel ischemic disease, and remote lacunar infarct in the right thalamus. 3. Remote medial right orbital wall fracture. Bilateral lens replacements. 4. Ethmoid air cell mucosal thickening. Left mastoid effusion. Electronically signed by: Timothy Berrigan MD 09/18/2024 04:27 AM EST RP Workstation: [id number]   Medications:  iron  sucrose 200 mg (09/18/24 1203)    amLODipine   10 mg Oral Daily   atorvastatin   80 mg Oral QPM   Chlorhexidine  Gluconate Cloth  6 each Topical Daily   clopidogrel   75 mg Oral Daily   heparin   5,000 Units Subcutaneous Q8H   insulin  aspart  0-9 Units Subcutaneous Q4H   insulin  aspart  3 Units Subcutaneous TID WC   insulin  glargine  12 Units Subcutaneous Daily   labetalol   200 mg Oral BID      Assessment/Plan: AKI on CKD4 -followed by us  at our [facility] office, typically seen by Dr. LULLA Stank. Baseline eGFR typically around 15-18 recently.  AKI likely in the context of DKA. Renal US  no obstruction.  UA  known proteinuria. -BUN 61, Cr 6.2, eGFR 11:  no current indications for RRT.  -continue with supportive care - no need for IVF at this time -Avoid nephrotoxic medications including NSAIDs and iodinated intravenous contrast exposure unless the latter is absolutely indicated.  Preferred narcotic agents for pain control are hydromorphone, fentanyl , and methadone. Morphine  should not be used. Avoid Baclofen and avoid oral sodium phosphate  and magnesium  citrate based laxatives / bowel preps. Continue strict Input and Output monitoring. Will monitor the patient closely with you and intervene or adjust therapy as indicated by changes in clinical status/labs    Acute metabolic encephalopathy -concern for uremia +/- DKA.  -improved - do not think uremic and no plans for HD  Diarrhea: new complaint yesterday but resolved today.   DKA -mgmt per primary service. S/p insulin  gtt, now on subcutaneous. S/p fluids   HTN -BP reasonably controlled   Anemia of CKD -received two doses of venofer  200mg  recently -Fe panel shows iron  sat 17%, redosed IV iron  this admit -transfuse prn for Hgb <7. Hgb 11.1 (likely hemoconcentrated)->8s now  [facility] for  d/c from my perspective, messaged [facility] schedulers to make sure he has appt with Dr. Dennise soon. If remains admitted will continue to follow.   Manuelita Barters MD 09/19/2024, 9:09 AM  [facility] Pager: [phone]   "

## 2024-09-19 NOTE — Progress Notes (Signed)
 Pt is alert and fully oriented x 4, able to ambulate independently to the bathroom. He is afebrile, stable hemodynamically, NSR on the monitor, no acute distress noted overnight. Pt is able to rest and sleep well with no major complaints. Pt has good oral intake. CBG checking q 4 hrs with insulin  sliding scale. Plan of care is reviewed. Pt has been progressing. We will continue to monitor.  Problem: Clinical Measurements: Goal: Ability to maintain clinical measurements within normal limits will improve Outcome: Progressing Goal: Will remain free from infection Outcome: Progressing Goal: Diagnostic test results will improve Outcome: Progressing Goal: Respiratory complications will improve Outcome: Progressing Goal: Cardiovascular complication will be avoided Outcome: Progressing   Problem: Nutrition: Goal: Adequate nutrition will be maintained Outcome: Progressing   Problem: Metabolic: Goal: Ability to maintain appropriate glucose levels will improve Outcome: Progressing   Wendi Dash, RN

## 2024-09-19 NOTE — Progress Notes (Signed)
 Occupational Therapy Treatment Patient Details Name: Nathaniel [name] MRN: [id number] DOB: Oct 27, 1985 Today's Date: 09/19/2024   History of present illness Nathaniel [name] is a 38 y.o. male with medical history significant of hypertension, hyperlipidemia, CVA, CKD 3B, type 1 diabetes mellitus, on insulin  pump at home, history of cardiac arrest in the past due to DKA/electrolyte derangement, necrotizing fasciitis last year, history of paresis who presents emergency department due to high levels of glucose in blood.  At bedside, patient was somnolent, was arousable, woke her glucose back to sleep.[name]  History was obtained from EDP and mother at bedside.  Per report, patient's blood glucose was reading high .  It was reported that patient has been taking his insulin  without taking other medications.  He presented with several episodes of nonbloody vomiting this morning, but no report of fever, chills, chest pain, shortness of breath.     He was recently admitted from 11/21 to 11/22 due to diabetes mellitus type 1, poorly controlled with severe DKA   OT comments  Pt agreeable to OT treatment. Pt reports not using AD at home, but that he does lean on walls on his way to the bathroom. Today pt required CGA to min A for standing and ambulating without AD; Pt able to complete grooming at the sink with min A at times due to posterior lean. B UE was [facility] today for strength and functional use. Pt reports assist for  R LE dressing at baseline. Pt reported not having a BSC today and no grab bars. He required CGA to min A to lower to the toilet without use of the grab bar. If pt does not have a BSC, it would be beneficial to have at home to increase safety with toilet transfers. Pt left in the bed with call bell within reach. Pt will benefit from continued OT in the hospital to increase strength, balance, and endurance for safe ADL's.         If plan is discharge home, recommend the following:  Assistance  with cooking/housework;Assist for transportation;Help with stairs or ramp for entrance;Direct supervision/assist for medications management;A little help with walking and/or transfers;A little help with bathing/dressing/bathroom   Equipment Recommendations  BSC/3in1 (If pt does not already have one.)          Precautions / Restrictions Precautions Precautions: Fall Recall of Precautions/Restrictions: Impaired Restrictions Weight Bearing Restrictions Per Provider Order: No       Mobility Bed Mobility Overal bed mobility: Needs Assistance Bed Mobility: Supine to Sit, Sit to Supine     Supine to sit: Supervision Sit to supine: Supervision   General bed mobility comments: Mild labored effort    Transfers Overall transfer level: Needs assistance   Transfers: Sit to/from Stand Sit to Stand: Contact guard assist, Min assist           General transfer comment: CGA to stand from EOB. Requried grab bar to R side to stand from toilet. Slight assist to lower to toilet to sit.     Balance Overall balance assessment: Needs assistance Sitting-balance support: Feet supported, No upper extremity supported Sitting balance-Leahy Scale: Good Sitting balance - Comments: seated at EOB   Standing balance support: No upper extremity supported, During functional activity Standing balance-Leahy Scale: Poor Standing balance comment: poor to fair without AD; labored movement; posterior lean.                           ADL either  performed or assessed with clinical judgement   ADL Overall ADL's : Needs assistance/impaired     Grooming: Wash/dry hands;Standing Grooming Details (indicate cue type and reason): CGA to min A while standing at the sink to wash hands. Some posterior lean.               Lower Body Dressing Details (indicate cue type and reason): Pt able to reach L LE well for dressing but reports he is not able to reach R LE for managing socks at  baseline. Toilet Transfer: Minimal assistance;Contact guard Marine Scientist Details (indicate cue type and reason): EOB to toilet ambulating without AD; min A to lower to toilet but mostly CGA otherwise. Using grab bar to R side. Pt reports not having a grab bar at home, but just a sink to push off of. Pt interested in having a BSC at home. chart had indicated the pt had a BSC, but pt stated he did not today.         Functional mobility during ADLs: Contact guard assist      Extremity/Trunk Assessment Upper Extremity Assessment Upper Extremity Assessment: Overall WFL for tasks assessed                             Communication Communication Communication: No apparent difficulties   Cognition Arousal: Alert Behavior During Therapy: WFL for tasks assessed/performed Cognition: No apparent impairments             OT - Cognition Comments: Pt oriented to place and year today.                 Following commands: Intact        Cueing   Cueing Techniques: Verbal cues, Tactile cues                     Pertinent Vitals/ Pain       Pain Assessment Pain Assessment: No/denies pain                                                          Frequency  Min 2X/week        Progress Toward Goals  OT Goals(current goals can now be found in the care plan section)  Progress towards OT goals: Progressing toward goals  Acute Rehab OT Goals Patient Stated Goal: Return home. OT Goal Formulation: With patient Time For Goal Achievement: 10/01/24 Potential to Achieve Goals: Good ADL Goals Pt Will Perform Grooming: with modified independence Pt Will Perform Lower Body Bathing: with modified independence Pt Will Perform Lower Body Dressing: with modified independence Pt Will Transfer to Toilet: with modified independence;ambulating Pt Will Perform Toileting - Clothing Manipulation and hygiene: with modified  independence Pt/caregiver will Perform Home Exercise Program: Increased strength;Independently;Both right and left upper extremity  Plan                                      End of Session Equipment Utilized During Treatment: Gait belt  OT Visit Diagnosis: Unsteadiness on feet (R26.81);Other abnormalities of gait and mobility (R26.89);Muscle weakness (generalized) (M62.81);History of falling (Z91.81);Other symptoms and signs involving cognitive function   Activity Tolerance Patient  tolerated treatment well   Patient Left in bed;with call bell/phone within reach;with bed alarm set   Nurse Communication          Time: [phone] OT Time Calculation (min): 12 min  Charges: OT General Charges $OT Visit: 1 Visit OT Treatments $Self Care/Home Management : 8-22 mins  Donnis Phaneuf OT, MOT  Jayson Person 09/19/2024, 11:13 AM

## 2024-09-19 NOTE — Progress Notes (Signed)
 Glucose 56 mg/dl. Pt alert and oriented and able to eat. Given orange juice. Will continue to monitor.

## 2024-09-19 NOTE — Progress Notes (Signed)
 Nurse at bedside,patient is alert and oriented to person,and situation,confused to place,and time.Blood pressure was 164/101,Dr Eric Nunnery notified.Plan of care on going.

## 2024-09-20 DIAGNOSIS — E108 Type 1 diabetes mellitus with unspecified complications: Secondary | ICD-10-CM | POA: Diagnosis not present

## 2024-09-20 DIAGNOSIS — N185 Chronic kidney disease, stage 5: Secondary | ICD-10-CM | POA: Diagnosis not present

## 2024-09-20 DIAGNOSIS — I10 Essential (primary) hypertension: Secondary | ICD-10-CM | POA: Diagnosis not present

## 2024-09-20 DIAGNOSIS — G9341 Metabolic encephalopathy: Secondary | ICD-10-CM | POA: Diagnosis not present

## 2024-09-20 LAB — RENAL FUNCTION PANEL
Albumin: 3.5 g/dL (ref 3.5–5.0)
Anion gap: 11 (ref 5–15)
BUN: 55 mg/dL — ABNORMAL HIGH (ref 6–20)
CO2: 23 mmol/L (ref 22–32)
Calcium: 7.5 mg/dL — ABNORMAL LOW (ref 8.9–10.3)
Chloride: 107 mmol/L (ref 98–111)
Creatinine, Ser: 5.81 mg/dL — ABNORMAL HIGH (ref 0.61–1.24)
GFR, Estimated: 12 mL/min — ABNORMAL LOW
Glucose, Bld: 76 mg/dL (ref 70–99)
Phosphorus: 4.4 mg/dL (ref 2.5–4.6)
Potassium: 4.3 mmol/L (ref 3.5–5.1)
Sodium: 141 mmol/L (ref 135–145)

## 2024-09-20 LAB — GLUCOSE, CAPILLARY
Glucose-Capillary: 102 mg/dL — ABNORMAL HIGH (ref 70–99)
Glucose-Capillary: 318 mg/dL — ABNORMAL HIGH (ref 70–99)
Glucose-Capillary: 91 mg/dL (ref 70–99)

## 2024-09-20 MED ORDER — LABETALOL HCL 200 MG PO TABS: 200.0000 mg | ORAL_TABLET | Freq: Two times a day (BID) | ORAL | 3 refills | Status: AC

## 2024-09-20 NOTE — Progress Notes (Signed)
 Nephrology Documentation Note  D/w primary MD - pt doing well and for D/C this AM.  No new issues.   My office confirmed yesterday they're scheduling pt with Dr. Dennise [place] office in the next few weeks.  He will receive a call with date and time.   Manuelita Barters MD [facility] Pager [phone]

## 2024-09-20 NOTE — Plan of Care (Signed)

## 2024-09-20 NOTE — TOC Transition Note (Signed)
 Transition of Care [facility]) - Discharge Note   Patient Details  Name: Nathaniel [name] MRN: [id number] Date of Birth: [birth year]  Transition of Care [facility]) CM/SW Contact:  Hoy DELENA Bigness, LCSW Phone Number: 09/20/2024, 9:46 AM   Clinical Narrative:    Pt to return home at discharge. Pt to receive HHPT/OT services through [facility]. HH order in place.    Final next level of care: Home w Home Health Services Barriers to Discharge: Barriers Resolved   Patient Goals and CMS Choice Patient states their goals for this hospitalization and ongoing recovery are:: reutrn home CMS Medicare.gov Compare Post Acute Care list provided to:: Patient Represenative (must comment) Choice offered to / list presented to : [facility] POA / Guardian      Discharge Placement                       Discharge Plan and Services Additional resources added to the After Visit Summary for   In-house Referral: Clinical Social Work Discharge Planning Services: CM Consult Post Acute Care Choice: Home Health          DME Arranged: N/A DME Agency: NA       HH Arranged: PT, OT HH Agency: [facility] Date HH Agency Contacted: 09/18/24   Representative spoke with at [facility] Agency: [place]  Social Drivers of Health (SDOH) Interventions SDOH Screenings   Food Insecurity: No Food Insecurity (09/17/2024)  Housing: Low Risk (09/17/2024)  Transportation Needs: No Transportation Needs (09/17/2024)  Utilities: Not At Risk (09/17/2024)  Depression (PHQ2-9): Low Risk (09/11/2024)  Financial Resource Strain: Low Risk (01/17/2023)  Tobacco Use: High Risk (09/19/2024)     Readmission Risk Interventions    09/20/2024    9:45 AM 08/11/2024   12:44 PM 10/20/2022    3:34 PM  Readmission Risk Prevention Plan  Transportation Screening Complete Complete Complete  PCP or Specialist Appt within 3-5 Days Complete    HRI or Home Care Consult Complete Complete Complete  Social Work Consult for Recovery Care  Planning/Counseling Complete Complete Complete  Palliative Care Screening Not Applicable Not Applicable Not Applicable  Medication Review Oceanographer) Complete Complete Referral to Pharmacy

## 2024-09-20 NOTE — Progress Notes (Signed)
 Pt has discharge orders in. IV has been removed with no complications. Tele has been removed and placed back at the nurses station. Packet has been placed at the nurses station for his mom (legal guardian).

## 2024-09-20 NOTE — Discharge Summary (Signed)
 " Physician Discharge Summary   Patient: Nathaniel [name] MRN: [id number] DOB: 12-01-1985  Admit date:     09/16/2024  Discharge date: 09/20/2024  Discharge Physician: Eric Nunnery   PCP: Carlette [name], MD   Recommendations at discharge:  Reassess blood pressure and adjust antihypertensive treatment as needed Repeat basic metabolic panel to follow electrolytes and renal function Continue to closely follow-up patient's CBG fluctuation and further adjust hypoglycemic regimen as needed Repeat CBC to follow hemoglobin trend/stability.  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 Patient diagnosed with Type 1 diabetes at 39 years of age  Patient is reportedly on an OmniPod with Dexcom monitoring.  However, review of his last endocrine notes shows that the patient seems to have poor insight regarding management of his Dexcom and Omnipod. Review of the [facility] record shows long history of poor compliance.  It was reported that the patient has had his Omnipod on, but he has not been taking any of his other medications. Notably, the patient was recently admitted to the hospital from 08/10/2024 to 08/11/2024 for DKA.  He was discharged home to restart his Omnipod insulin .   In the ED, the patient was afebrile and hemodynamically stable with oxygen saturation 96-100% room air. Sodium  128, potassium 4.5, bicarbonate <7 with anion gap that was unable to be calculated.  WBC 20.3, hemoglobin 11.1, platelet 267. The patient was started on insulin  drip and IV fluids. The patient's DKA improved, and he was transitioned to subcutaneous insulin . However, the patient remained somnolent.  There was concern the patient's progression of CKD may be contributing.  Nephrology was consulted to assist with management. The patient's DKA resolved.  He was transitioned to subcutaneous insulin .  Unfortunately, his mental status was slow to improve.  There was concern for uremia.  Fortunately, on 09/18/2024, the patient's mental status improved back to baseline.      Assessment and Plan: DKA Type 1 --patient started on IV insulin  with q 1 hour CBG check and q 4 hour BMPs -pt started on aggressive fluid resuscitation -Electrolytes were monitored and repleted -transitioned to [place] insulin  once anion gap closed and at discharge patient to resume insulin  therapy through insulin  pump. -Tolerating diet without problems at time of discharge. -09/10/24 HbA1C--8.3 -Continue to follow CBGs fluctuation and further adjust hypoglycemic regimen as needed - Modified carbohydrate diet and medication compliance has been discussed with patient.   Acute metabolic encephalopathy/deconditioning - 09/17/2024--patient remains somewhat somnolent - Concern progression of his CKD contributing to encephalopathy - Nephrology consulted - B12--759 - Folate--6.8 - TSH--1.030 - Ammonia--21 - UDS +THC - UA--no pyuria - CT brain--no acute findings -09/18/2024--- mental status back to baseline -Mentation has remained stable - Continue constant reorientation and supportive care. - Home health services for PT/OT has been arranged at discharge for transition of care and conditioning.   Acute on chronic renal failure stage IV - Baseline creatinine 5.9-6.1 - Nephrology consult appreciated -  Creatinine down to 5.81 and GFR  12 at time of discharge - Continue to maintain adequate oral hydration - Continue to follow renal function trend - Patient will require close follow-up with nephrology after discharge. -Patient advised to maintain adequate hydration.   Uncontrolled diabetes mellitus type 1 with hyperglycemia - 09/17/2024--start Semglee  12 units - Started lispro every 4 hours sliding scale - continue lispro 3 units with meals -Continue to follow CBG fluctuation and adjust hypoglycemic regimen as needed.   Essential hypertension - Continue amlodipine  - Continue adjusted dose of labetalol  - Low-sodium diet discussed with patient. - Maintain adequate hydration.   Mixed hyperlipidemia - Continue statin - Modified carbohydrate and heart healthy diet discussed with patient.   History of stroke - Continue aspirin  and Plavix  for secondary prevention. -No complaints of new neurologic deficits.   Hyperkalemia - Improved with insulin  and fluids -Continue to follow electrolytes trend.  Consultants: Nephrology service Procedures performed: See below for x-ray reports. Disposition: Home with home health services. Diet recommendation: Heart healthy/renal and modified carbohydrate diet.  DISCHARGE MEDICATION: Allergies as of 09/20/2024       Reactions   Fructose Other (See Comments)   Increase of blood sugar Orange juice        Medication List     TAKE these medications    amLODipine  10 MG tablet Commonly known as: NORVASC  Take 1 tablet (10 mg total) by mouth daily. For blood pressure   Aspirin  Low Dose 81 MG tablet Generic drug: aspirin  EC Take 81 mg by mouth every morning.   atorvastatin  80 MG tablet Commonly known as: LIPITOR  Take 1 tablet (80 mg total) by mouth every evening. For stroke prevention   calcitRIOL 0.25 MCG capsule Commonly known as: ROCALTROL Take 0.25 mcg by mouth daily.   clopidogrel  75 MG tablet Commonly known as: PLAVIX  Take 1 tablet (75 mg total) by mouth daily.  For stroke prevention   Dexcom G6 Transmitter Misc Check sugar 4 times a day   Dexcom G7 Sensor Misc Inject 1 Application into the skin as directed. Change sensor every 10 days as directed.   Fiasp  FlexTouch 100 UNIT/ML FlexTouch Pen Generic drug: insulin  aspart Inject 2-5 Units into the skin in the morning, at noon, and at bedtime. Up to 150 in pod for 3 days   insulin  aspart 100 UNIT/ML injection Commonly known as: NovoLOG  Use with Omnipod for TDD around 50 units daily   labetalol  200 MG tablet Commonly known as: NORMODYNE  Take 1 tablet (200 mg total) by mouth 2 (two) times daily. What changed:  medication strength how much to take additional instructions   Omnipod 5 DexG7G6 Intro Gen 5 Kit Change pod every 72 hours   Omnipod 5 DexG7G6 Pods Gen 5 Misc Change pod every 72 hours   OneTouch Ultra test strip Generic drug: glucose blood USE TO TEST BLOOD SUGAR 4 TIMES A DAY   Pen Needles 3/16 31G X 5 MM Misc Four times day   Veltassa 8.4 g packet Generic drug: patiromer Take 1 packet by mouth 3 (three) times a week.        Contact information for follow-up providers     Fanta, [name] Demissie, MD. Schedule an appointment as soon as possible for a visit in 2 week(s).   Specialty: Internal Medicine Contact information: [address] [place] [address] [phone]         Dennise Hoes, MD Follow up today.   Specialty: Nephrology Why: Office will contact you with appointment  details. Contact information: [address] [place] [address] [phone]              Contact information for after-discharge care     Home Medical Care     [facility] [place]) .   Service: Home Health Services Contact information: [address] [place]  [address] [phone]                    Discharge Exam: Fredricka Weights   09/16/24 1817  Weight: 63.5 kg    General exam: Alert,  awake, oriented x 3; reports no nausea and no vomiting.  Reporting decreased appetite. Respiratory system: Good saturation on room air; no using accessory muscles. Cardiovascular system:RRR. No murmurs, rubs, gallops. Gastrointestinal system: Abdomen is nondistended, soft and nontender. No organomegaly or masses felt. Normal bowel sounds heard. Central nervous system: Alert and oriented. No focal neurological deficits. Extremities: No cyanosis, clubbing or edema. Skin: No petechiae. Psychiatry: Judgement and insight appear normal. Mood & affect appropriate.   Condition at discharge: Stable and improved.  The results of significant diagnostics from this hospitalization (including imaging, microbiology, ancillary and laboratory) are listed below for reference.   Imaging Studies: US  RENAL Result Date: 09/18/2024 EXAM: US  Retroperitoneum Complete, Renal. 09/17/2024 12:08:07 PM TECHNIQUE: Real-time ultrasonography of the retroperitoneum renal was performed. COMPARISON: US  Renal 05/01/2019. CLINICAL HISTORY: AKI (acute kidney injury). FINDINGS: FINDINGS: RIGHT KIDNEY/URETER: Right kidney measures 9.0 x 3.9 x 5.5 cm. The right kidney is diffusely increased in echogenicity, suggesting underlying renal medical disease. No hydronephrosis. No calculus. No mass. LEFT KIDNEY/URETER: Left kidney measures 8.9 x 4.1 x 3.6 cm. Normal cortical echogenicity. No hydronephrosis. No calculus. No mass. BLADDER: Unremarkable appearance of the bladder. IMPRESSION: 1. Right kidney with diffusely increased echogenicity, suggesting underlying medical renal disease. 2. Left kidney without acute abnormality. Electronically signed by: Evalene Coho MD 09/18/2024 04:29 AM EST RP Workstation: [id number]   CT HEAD WO CONTRAST ( ) Result Date: 09/18/2024 EXAM: CT HEAD WITHOUT CONTRAST 09/17/2024 12:44:20 PM TECHNIQUE: CT of the head was performed without the administration of intravenous contrast. Automated exposure control,  iterative reconstruction, and/or weight based adjustment of the mA/kV was utilized to reduce the radiation dose to as low as reasonably achievable. COMPARISON: 08/11/2024 CLINICAL HISTORY: Mental status change, unknown cause FINDINGS: BRAIN AND VENTRICLES: Small remote bilateral cerebellar lacunar infarcts. Mild periventricular white matter changes, likely sequela of chronic small vessel ischemic disease. Remote lacunar infarct in right thalamus. Intracranial arterial calcification. No acute hemorrhage. No evidence of acute infarct. No hydrocephalus. No extra-axial collection. No mass effect or midline shift. ORBITS: Remote medial right orbital wall fracture. Bilateral lens replacement noted. SINUSES: Ethmoid air cells mucosal thickening. Left mastoid effusion. SOFT TISSUES AND SKULL: No acute soft tissue abnormality. No skull fracture. IMPRESSION: 1. No acute intracranial abnormality. 2. Small remote bilateral cerebellar lacunar infarcts, mild periventricular white matter changes likely sequela of chronic small vessel ischemic disease, and remote lacunar infarct in the right thalamus. 3. Remote medial right orbital wall fracture. Bilateral lens replacements. 4. Ethmoid air cell mucosal thickening. Left mastoid effusion. Electronically signed by: Evalene Coho MD 09/18/2024 04:27 AM EST RP Workstation: [id number]    Microbiology: Results for orders placed or performed during the hospital encounter of 09/16/24  MRSA Next Gen by PCR, Nasal     Status: None   Collection Time: 09/17/24  4:41 AM   Specimen: Nasal Mucosa; Nasal Swab  Result Value Ref Range Status  MRSA by PCR Next Gen NOT DETECTED NOT DETECTED Final    Comment: (NOTE) The GeneXpert MRSA Assay (FDA approved for NASAL specimens only), is one component of a comprehensive MRSA colonization surveillance program. It is not intended to diagnose MRSA infection nor to guide or monitor treatment for MRSA infections. Test performance is not FDA  approved in patients less than 32 years old. Performed at [facility], [address], [place], [place] [address]     Labs: CBC: Recent Labs  Lab 09/16/24 1847 09/17/24 0238 09/18/24 0259  WBC 20.3* 13.9* 9.5  HGB 11.1* 8.5* 8.2*  HCT 34.6* 24.6* 24.4*  MCV 94.0 86.9 89.1  PLT 267 207 178   Basic Metabolic Panel: Recent Labs  Lab 09/17/24 0238 09/17/24 0721 09/17/24 1115 09/18/24 0259 09/19/24 0447 09/20/24 0444  NA 133* 135 134* 138 139 141  K 4.0 4.0 4.3 3.7 3.8 4.3  CL 96* 98 96* 102 104 107  CO2 20* 21* 14* 22 22 23   GLUCOSE 216* 155* 275* 158* 123* 76  BUN 73* 72* 72* 66* 61* 55*  CREATININE 6.12* 6.18* 6.00* 6.36* 6.23* 5.81*  CALCIUM  7.8* 7.8* 8.0* 7.8* 7.7* 7.5*  MG 1.6*  --   --   --   --   --   PHOS 5.5*  --   --  5.3* 5.1* 4.4   Liver Function Tests: Recent Labs  Lab 09/18/24 0259 09/19/24 0447 09/20/24 0444  ALBUMIN 3.5 3.6 3.5   CBG: Recent Labs  Lab 09/19/24 2043 09/19/24 2134 09/19/24 2358 09/20/24 0403 09/20/24 0731  GLUCAP 57* 141* 318* 102* 91    Discharge time spent:  35 minutes.  Signed: Eric Nunnery, MD [facility] 09/20/2024 "

## 2024-09-21 ENCOUNTER — Encounter: Attending: Internal Medicine | Admitting: Internal Medicine

## 2024-09-21 VITALS — BP 125/81 | HR 84 | Temp 97.9°F | Resp 16

## 2024-09-21 DIAGNOSIS — D631 Anemia in chronic kidney disease: Secondary | ICD-10-CM | POA: Insufficient documentation

## 2024-09-21 DIAGNOSIS — N1832 Chronic kidney disease, stage 3b: Secondary | ICD-10-CM | POA: Insufficient documentation

## 2024-09-21 MED ORDER — ACETAMINOPHEN 325 MG PO TABS
650.0000 mg | ORAL_TABLET | Freq: Once | ORAL | Status: AC
  Administered 2024-09-21: 650 mg via ORAL

## 2024-09-21 MED ORDER — SODIUM CHLORIDE 0.9 % IV SOLN
200.0000 mg | Freq: Once | INTRAVENOUS | Status: AC
  Administered 2024-09-21: 200 mg via INTRAVENOUS
  Filled 2024-09-21: qty 10

## 2024-09-21 MED ORDER — DIPHENHYDRAMINE HCL 25 MG PO CAPS
25.0000 mg | ORAL_CAPSULE | Freq: Once | ORAL | Status: AC
  Administered 2024-09-21: 25 mg via ORAL

## 2024-09-21 NOTE — Progress Notes (Signed)
 Diagnosis:  Iron  Deficiency Anemia  Provider:  Singh,Vikas,MD  Procedure: IV Infusion  IV Type: Peripheral, IV Location: R Upper Arm  , Venofer  (Iron  Sucrose), Dose: 200 mg  Infusion Start Time: 1405  Infusion Stop Time: 1421  Post Infusion IV Care: Observation period completed  Discharge: Condition: Good, Destination: Home . AVS Declined  Performed by:  Sadie Pickar R, LPN

## 2024-10-06 ENCOUNTER — Other Ambulatory Visit: Payer: Self-pay | Admitting: Nurse Practitioner

## 2024-10-08 NOTE — Telephone Encounter (Signed)
 AND, I refilled that in December at his visit with me (the insulin  vials, that is)

## 2024-10-08 NOTE — Telephone Encounter (Signed)
 He should be getting insulin  vials for his Omnipod.  I think this was entered by the hospitalist at his last ED trip.

## 2024-10-16 ENCOUNTER — Other Ambulatory Visit: Payer: Self-pay | Admitting: Nurse Practitioner

## 2024-10-16 NOTE — Telephone Encounter (Signed)
 Patient with type 1 diabetes, needs PA for his Omnipods please.

## 2024-10-17 ENCOUNTER — Other Ambulatory Visit (HOSPITAL_COMMUNITY): Payer: Self-pay

## 2024-10-18 ENCOUNTER — Telehealth: Payer: Self-pay

## 2024-10-18 ENCOUNTER — Other Ambulatory Visit (HOSPITAL_COMMUNITY): Payer: Self-pay

## 2024-10-18 NOTE — Telephone Encounter (Signed)
 Pharmacy Patient Advocate Encounter   Received notification from Pt Calls Messages that prior authorization for omnipod is required/requested.   Insurance verification completed.   The patient is insured through [employer] [facility].   Per test claim: PA required; PA submitted to above mentioned insurance via Latent Key/confirmation #/EOC [id number] Status is pending

## 2024-10-19 NOTE — Telephone Encounter (Signed)
 Pharmacy Patient Advocate Encounter  Received notification from [employer] that Prior Authorization for omnipods has been APPROVED from 09/20/2024 to 09/19/2025

## 2024-10-23 NOTE — Telephone Encounter (Signed)
 Patient was called and his mother was made aware of the approval.

## 2024-11-06 ENCOUNTER — Ambulatory Visit: Admitting: Vascular Surgery

## 2024-12-26 ENCOUNTER — Ambulatory Visit: Admitting: Nurse Practitioner
